# Patient Record
Sex: Female | Born: 1940 | Race: White | Hispanic: No | Marital: Married | Smoking: Former smoker
Health system: Southern US, Community
[De-identification: ages and names within clinical notes are randomized; demographics above are authoritative.]

## PROBLEM LIST (undated history)

## (undated) DIAGNOSIS — O99019 Anemia complicating pregnancy, unspecified trimester: Secondary | ICD-10-CM

## (undated) DIAGNOSIS — Z9049 Acquired absence of other specified parts of digestive tract: Secondary | ICD-10-CM

## (undated) DIAGNOSIS — M1990 Unspecified osteoarthritis, unspecified site: Secondary | ICD-10-CM

## (undated) DIAGNOSIS — I712 Thoracic aortic aneurysm, without rupture, unspecified: Secondary | ICD-10-CM

## (undated) DIAGNOSIS — S83209A Unspecified tear of unspecified meniscus, current injury, unspecified knee, initial encounter: Secondary | ICD-10-CM

## (undated) DIAGNOSIS — E785 Hyperlipidemia, unspecified: Secondary | ICD-10-CM

## (undated) DIAGNOSIS — F32A Depression, unspecified: Secondary | ICD-10-CM

## (undated) DIAGNOSIS — C801 Malignant (primary) neoplasm, unspecified: Secondary | ICD-10-CM

## (undated) DIAGNOSIS — M109 Gout, unspecified: Secondary | ICD-10-CM

## (undated) DIAGNOSIS — D0511 Intraductal carcinoma in situ of right breast: Secondary | ICD-10-CM

## (undated) DIAGNOSIS — C50919 Malignant neoplasm of unspecified site of unspecified female breast: Secondary | ICD-10-CM

## (undated) DIAGNOSIS — R05 Cough: Secondary | ICD-10-CM

## (undated) DIAGNOSIS — E119 Type 2 diabetes mellitus without complications: Secondary | ICD-10-CM

## (undated) DIAGNOSIS — K2270 Barrett's esophagus without dysplasia: Secondary | ICD-10-CM

## (undated) DIAGNOSIS — C439 Malignant melanoma of skin, unspecified: Secondary | ICD-10-CM

## (undated) DIAGNOSIS — K219 Gastro-esophageal reflux disease without esophagitis: Secondary | ICD-10-CM

## (undated) DIAGNOSIS — G4739 Other sleep apnea: Secondary | ICD-10-CM

## (undated) DIAGNOSIS — I10 Essential (primary) hypertension: Secondary | ICD-10-CM

## (undated) DIAGNOSIS — R053 Chronic cough: Secondary | ICD-10-CM

## (undated) DIAGNOSIS — T4145XA Adverse effect of unspecified anesthetic, initial encounter: Secondary | ICD-10-CM

## (undated) DIAGNOSIS — T8859XA Other complications of anesthesia, initial encounter: Secondary | ICD-10-CM

## (undated) DIAGNOSIS — F329 Major depressive disorder, single episode, unspecified: Secondary | ICD-10-CM

## (undated) DIAGNOSIS — H269 Unspecified cataract: Secondary | ICD-10-CM

## (undated) DIAGNOSIS — Z8489 Family history of other specified conditions: Secondary | ICD-10-CM

## (undated) DIAGNOSIS — I341 Nonrheumatic mitral (valve) prolapse: Secondary | ICD-10-CM

## (undated) DIAGNOSIS — N059 Unspecified nephritic syndrome with unspecified morphologic changes: Secondary | ICD-10-CM

## (undated) DIAGNOSIS — E7800 Pure hypercholesterolemia, unspecified: Secondary | ICD-10-CM

## (undated) DIAGNOSIS — I639 Cerebral infarction, unspecified: Secondary | ICD-10-CM

## (undated) HISTORY — PX: TONSILLECTOMY: SUR1361

## (undated) HISTORY — PX: CHOLECYSTECTOMY: SHX55

## (undated) HISTORY — PX: APPENDECTOMY: SHX54

## (undated) HISTORY — PX: PARTIAL HIP ARTHROPLASTY: SHX733

## (undated) HISTORY — PX: EYE SURGERY: SHX253

## (undated) HISTORY — DX: Intraductal carcinoma in situ of right breast: D05.11

## (undated) HISTORY — PX: ABDOMINAL HYSTERECTOMY: SHX81

## (undated) HISTORY — PX: FRACTURE SURGERY: SHX138

## (undated) HISTORY — DX: Unspecified cataract: H26.9

## (undated) HISTORY — PX: COLONOSCOPY: SHX174

## (undated) HISTORY — DX: Malignant neoplasm of unspecified site of unspecified female breast: C50.919

## (undated) HISTORY — PX: BREAST CYST ASPIRATION: SHX578

## (undated) HISTORY — PX: ESOPHAGOGASTRODUODENOSCOPY: SHX1529

## (undated) HISTORY — PX: BREAST EXCISIONAL BIOPSY: SUR124

## (undated) HISTORY — PX: JOINT REPLACEMENT: SHX530

---
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
## 2004-04-24 DIAGNOSIS — G510 Bell's palsy: Secondary | ICD-10-CM

## 2004-04-24 HISTORY — DX: Bell's palsy: G51.0

## 2004-07-05 ENCOUNTER — Ambulatory Visit: Payer: Self-pay | Admitting: Unknown Physician Specialty

## 2004-09-26 ENCOUNTER — Inpatient Hospital Stay: Payer: Self-pay | Admitting: Internal Medicine

## 2004-09-26 ENCOUNTER — Other Ambulatory Visit: Payer: Self-pay

## 2004-10-03 ENCOUNTER — Ambulatory Visit: Payer: Self-pay | Admitting: Family Medicine

## 2005-07-11 ENCOUNTER — Ambulatory Visit: Payer: Self-pay | Admitting: Unknown Physician Specialty

## 2007-07-22 ENCOUNTER — Ambulatory Visit: Payer: Self-pay | Admitting: Unknown Physician Specialty

## 2008-10-18 ENCOUNTER — Ambulatory Visit: Payer: Self-pay | Admitting: Unknown Physician Specialty

## 2008-10-20 ENCOUNTER — Ambulatory Visit: Payer: Self-pay | Admitting: Unknown Physician Specialty

## 2008-11-27 ENCOUNTER — Inpatient Hospital Stay: Payer: Self-pay | Admitting: Orthopedic Surgery

## 2009-08-02 ENCOUNTER — Ambulatory Visit: Payer: Self-pay | Admitting: Unknown Physician Specialty

## 2009-08-03 ENCOUNTER — Ambulatory Visit: Payer: Self-pay | Admitting: Unknown Physician Specialty

## 2010-03-23 ENCOUNTER — Ambulatory Visit: Payer: Self-pay | Admitting: Unknown Physician Specialty

## 2010-05-10 ENCOUNTER — Ambulatory Visit: Payer: Self-pay | Admitting: Unknown Physician Specialty

## 2010-05-12 LAB — PATHOLOGY REPORT

## 2011-06-01 ENCOUNTER — Ambulatory Visit: Payer: Self-pay | Admitting: Family Medicine

## 2011-06-01 DIAGNOSIS — E7800 Pure hypercholesterolemia, unspecified: Secondary | ICD-10-CM | POA: Diagnosis not present

## 2011-06-01 DIAGNOSIS — Z1231 Encounter for screening mammogram for malignant neoplasm of breast: Secondary | ICD-10-CM | POA: Diagnosis not present

## 2011-06-01 DIAGNOSIS — R209 Unspecified disturbances of skin sensation: Secondary | ICD-10-CM | POA: Diagnosis not present

## 2011-10-24 DIAGNOSIS — Z23 Encounter for immunization: Secondary | ICD-10-CM | POA: Diagnosis not present

## 2012-04-22 DIAGNOSIS — Z85820 Personal history of malignant melanoma of skin: Secondary | ICD-10-CM | POA: Diagnosis not present

## 2012-04-22 DIAGNOSIS — L570 Actinic keratosis: Secondary | ICD-10-CM | POA: Diagnosis not present

## 2013-03-23 DIAGNOSIS — D235 Other benign neoplasm of skin of trunk: Secondary | ICD-10-CM | POA: Diagnosis not present

## 2013-03-23 DIAGNOSIS — Z85820 Personal history of malignant melanoma of skin: Secondary | ICD-10-CM | POA: Diagnosis not present

## 2013-03-23 DIAGNOSIS — L821 Other seborrheic keratosis: Secondary | ICD-10-CM | POA: Diagnosis not present

## 2013-06-11 DIAGNOSIS — M722 Plantar fascial fibromatosis: Secondary | ICD-10-CM | POA: Diagnosis not present

## 2013-08-21 DIAGNOSIS — E7800 Pure hypercholesterolemia, unspecified: Secondary | ICD-10-CM | POA: Diagnosis not present

## 2013-08-21 DIAGNOSIS — Z0000 Encounter for general adult medical examination without abnormal findings: Secondary | ICD-10-CM | POA: Diagnosis not present

## 2013-08-21 DIAGNOSIS — Z23 Encounter for immunization: Secondary | ICD-10-CM | POA: Diagnosis not present

## 2013-08-21 DIAGNOSIS — E119 Type 2 diabetes mellitus without complications: Secondary | ICD-10-CM | POA: Diagnosis not present

## 2013-08-21 DIAGNOSIS — M25569 Pain in unspecified knee: Secondary | ICD-10-CM | POA: Diagnosis not present

## 2013-08-28 DIAGNOSIS — E7800 Pure hypercholesterolemia, unspecified: Secondary | ICD-10-CM | POA: Diagnosis not present

## 2013-08-28 DIAGNOSIS — E119 Type 2 diabetes mellitus without complications: Secondary | ICD-10-CM | POA: Diagnosis not present

## 2013-08-28 DIAGNOSIS — I10 Essential (primary) hypertension: Secondary | ICD-10-CM | POA: Diagnosis not present

## 2013-08-28 DIAGNOSIS — R252 Cramp and spasm: Secondary | ICD-10-CM | POA: Diagnosis not present

## 2013-08-31 DIAGNOSIS — E119 Type 2 diabetes mellitus without complications: Secondary | ICD-10-CM | POA: Diagnosis not present

## 2013-09-09 DIAGNOSIS — E7800 Pure hypercholesterolemia, unspecified: Secondary | ICD-10-CM | POA: Diagnosis not present

## 2013-09-09 DIAGNOSIS — E119 Type 2 diabetes mellitus without complications: Secondary | ICD-10-CM | POA: Diagnosis not present

## 2013-09-09 DIAGNOSIS — F3289 Other specified depressive episodes: Secondary | ICD-10-CM | POA: Diagnosis not present

## 2013-10-08 ENCOUNTER — Ambulatory Visit: Payer: Self-pay | Admitting: Family Medicine

## 2013-10-08 DIAGNOSIS — Z1231 Encounter for screening mammogram for malignant neoplasm of breast: Secondary | ICD-10-CM | POA: Diagnosis not present

## 2013-10-08 DIAGNOSIS — M949 Disorder of cartilage, unspecified: Secondary | ICD-10-CM | POA: Diagnosis not present

## 2013-10-08 DIAGNOSIS — M899 Disorder of bone, unspecified: Secondary | ICD-10-CM | POA: Diagnosis not present

## 2013-10-20 DIAGNOSIS — M25562 Pain in left knee: Secondary | ICD-10-CM | POA: Diagnosis not present

## 2013-10-20 DIAGNOSIS — Z96652 Presence of left artificial knee joint: Secondary | ICD-10-CM | POA: Diagnosis not present

## 2013-10-20 DIAGNOSIS — M2392 Unspecified internal derangement of left knee: Secondary | ICD-10-CM | POA: Diagnosis not present

## 2013-11-18 DIAGNOSIS — E038 Other specified hypothyroidism: Secondary | ICD-10-CM | POA: Diagnosis not present

## 2013-11-18 DIAGNOSIS — E780 Pure hypercholesterolemia: Secondary | ICD-10-CM | POA: Diagnosis not present

## 2013-11-18 DIAGNOSIS — I10 Essential (primary) hypertension: Secondary | ICD-10-CM | POA: Diagnosis not present

## 2013-11-18 DIAGNOSIS — Z23 Encounter for immunization: Secondary | ICD-10-CM | POA: Diagnosis not present

## 2013-11-18 DIAGNOSIS — E119 Type 2 diabetes mellitus without complications: Secondary | ICD-10-CM | POA: Diagnosis not present

## 2013-11-23 DIAGNOSIS — Z85820 Personal history of malignant melanoma of skin: Secondary | ICD-10-CM | POA: Diagnosis not present

## 2013-11-23 DIAGNOSIS — D2271 Melanocytic nevi of right lower limb, including hip: Secondary | ICD-10-CM | POA: Diagnosis not present

## 2013-11-23 DIAGNOSIS — L538 Other specified erythematous conditions: Secondary | ICD-10-CM | POA: Diagnosis not present

## 2013-11-23 DIAGNOSIS — L298 Other pruritus: Secondary | ICD-10-CM | POA: Diagnosis not present

## 2013-11-23 DIAGNOSIS — D2262 Melanocytic nevi of left upper limb, including shoulder: Secondary | ICD-10-CM | POA: Diagnosis not present

## 2013-11-23 DIAGNOSIS — D2261 Melanocytic nevi of right upper limb, including shoulder: Secondary | ICD-10-CM | POA: Diagnosis not present

## 2013-11-23 DIAGNOSIS — L820 Inflamed seborrheic keratosis: Secondary | ICD-10-CM | POA: Diagnosis not present

## 2014-07-16 ENCOUNTER — Other Ambulatory Visit: Payer: Self-pay | Admitting: Family Medicine

## 2014-07-16 DIAGNOSIS — N3281 Overactive bladder: Secondary | ICD-10-CM | POA: Insufficient documentation

## 2014-07-16 DIAGNOSIS — R350 Frequency of micturition: Secondary | ICD-10-CM

## 2014-07-28 ENCOUNTER — Other Ambulatory Visit: Payer: Self-pay | Admitting: Family Medicine

## 2014-07-28 DIAGNOSIS — F329 Major depressive disorder, single episode, unspecified: Secondary | ICD-10-CM

## 2014-07-28 DIAGNOSIS — F32A Depression, unspecified: Secondary | ICD-10-CM

## 2014-08-06 ENCOUNTER — Other Ambulatory Visit: Payer: Self-pay | Admitting: Family Medicine

## 2014-08-06 DIAGNOSIS — K219 Gastro-esophageal reflux disease without esophagitis: Secondary | ICD-10-CM | POA: Insufficient documentation

## 2014-09-26 ENCOUNTER — Other Ambulatory Visit: Payer: Self-pay | Admitting: Family Medicine

## 2014-09-26 DIAGNOSIS — I10 Essential (primary) hypertension: Secondary | ICD-10-CM

## 2014-09-27 DIAGNOSIS — I152 Hypertension secondary to endocrine disorders: Secondary | ICD-10-CM | POA: Insufficient documentation

## 2014-09-27 DIAGNOSIS — I10 Essential (primary) hypertension: Secondary | ICD-10-CM | POA: Insufficient documentation

## 2014-09-27 NOTE — Telephone Encounter (Signed)
Last OV 11/18/2013  Thanks,   -Mickel Baas

## 2014-10-17 ENCOUNTER — Other Ambulatory Visit: Payer: Self-pay | Admitting: Family Medicine

## 2014-10-17 DIAGNOSIS — R350 Frequency of micturition: Secondary | ICD-10-CM

## 2014-10-18 NOTE — Telephone Encounter (Signed)
Last OV 11/2013  Thanks,   -Laura  

## 2014-10-21 DIAGNOSIS — H2513 Age-related nuclear cataract, bilateral: Secondary | ICD-10-CM | POA: Diagnosis not present

## 2014-10-21 LAB — HM DIABETES EYE EXAM

## 2014-10-28 ENCOUNTER — Other Ambulatory Visit: Payer: Self-pay | Admitting: Family Medicine

## 2014-10-28 DIAGNOSIS — E7800 Pure hypercholesterolemia, unspecified: Secondary | ICD-10-CM

## 2014-10-28 DIAGNOSIS — E785 Hyperlipidemia, unspecified: Secondary | ICD-10-CM | POA: Insufficient documentation

## 2014-11-21 ENCOUNTER — Other Ambulatory Visit: Payer: Self-pay | Admitting: Family Medicine

## 2014-11-21 DIAGNOSIS — F4322 Adjustment disorder with anxiety: Secondary | ICD-10-CM

## 2014-11-22 DIAGNOSIS — D2272 Melanocytic nevi of left lower limb, including hip: Secondary | ICD-10-CM | POA: Diagnosis not present

## 2014-11-22 DIAGNOSIS — D2261 Melanocytic nevi of right upper limb, including shoulder: Secondary | ICD-10-CM | POA: Diagnosis not present

## 2014-11-22 DIAGNOSIS — D2239 Melanocytic nevi of other parts of face: Secondary | ICD-10-CM | POA: Diagnosis not present

## 2014-11-22 DIAGNOSIS — F4320 Adjustment disorder, unspecified: Secondary | ICD-10-CM | POA: Insufficient documentation

## 2014-11-22 DIAGNOSIS — Z85820 Personal history of malignant melanoma of skin: Secondary | ICD-10-CM | POA: Diagnosis not present

## 2014-12-08 DIAGNOSIS — H02833 Dermatochalasis of right eye, unspecified eyelid: Secondary | ICD-10-CM | POA: Diagnosis not present

## 2014-12-16 DIAGNOSIS — H02831 Dermatochalasis of right upper eyelid: Secondary | ICD-10-CM | POA: Diagnosis not present

## 2014-12-16 DIAGNOSIS — H02832 Dermatochalasis of right lower eyelid: Secondary | ICD-10-CM | POA: Diagnosis not present

## 2014-12-25 ENCOUNTER — Other Ambulatory Visit: Payer: Self-pay | Admitting: Family Medicine

## 2014-12-25 DIAGNOSIS — I10 Essential (primary) hypertension: Secondary | ICD-10-CM

## 2014-12-27 NOTE — Telephone Encounter (Signed)
Last OV 11/2013  Thanks,   -Sherhonda Gaspar  

## 2015-01-22 ENCOUNTER — Other Ambulatory Visit: Payer: Self-pay | Admitting: Family Medicine

## 2015-01-24 ENCOUNTER — Encounter: Payer: Self-pay | Admitting: *Deleted

## 2015-01-26 ENCOUNTER — Other Ambulatory Visit: Payer: Self-pay | Admitting: Family Medicine

## 2015-01-26 DIAGNOSIS — F329 Major depressive disorder, single episode, unspecified: Secondary | ICD-10-CM | POA: Insufficient documentation

## 2015-01-26 DIAGNOSIS — F32A Depression, unspecified: Secondary | ICD-10-CM | POA: Insufficient documentation

## 2015-01-26 NOTE — Telephone Encounter (Signed)
Last OV 11/2013  Thanks,   -Thaddeus Evitts  

## 2015-01-31 NOTE — Discharge Instructions (Signed)
INSTRUCTIONS FOLLOWING OCULOPLASTIC SURGERY °AMY M. FOWLER, MD ° °AFTER YOUR EYE SURGERY, THER ARE MANY THINGS THWIHC YOU, THE PATIENT, CAN DO TO ASSURE THE BEST POSSIBLE RESULT FROM YOUR OPERATION.  THIS SHEET SHOULD BE REFERRED TO WHENEVER QUESTIONS ARISE.  IF THERE ARE ANY QUESTIONS NOT ANSWERED HERE, DO NOT HESITATE TO CALL OUR OFFICE AT [phone] OR [phone].  THERE IS ALWAYS [facility] AVAILABLE TO CALL IF QUESTIONS OR PROBLEMS ARISE. ° °VISION: Your vision may be blurred and out of focus after surgery until you are able to stop using your ointment, swelling resolves and your eye(s) heal. This may take 1 to 2 weeks at the least.  If your vision becomes gradually more dim or dark, this is not normal and you need to call our office immediately. ° °EYE CARE: For the first 48 hours after surgery, use ice packs frequently - “20 minutes on, 20 minutes off” - to help reduce swelling and bruising.  Small bags of frozen peas or corn make good ice packs along with cloths soaked in ice water.  If you are wearing a patch or other type of dressing following surgery, keep this on for the amount of time specified by your doctor.  For the first week following surgery, you will need to treat your stitches with great care.  If is OK to shower, but take care to not allow soapy water to run into your eye(s) to help reduce changes of infection.  You may gently clean the eyelashes and around the eye(s) with cotton balls and sterile water, BUT DO NOT RUB THE STITCHES VIGOROUSLY.  Keeping your stitches moist with ointment will help promote healing with minimal scar formation. ° °ACTIVITY: When you leave the surgery center, you should go home, rest and be inactive.  The eye(s) may feel scratchy and keeping the eyes closed will allow for faster healing.  The first week following surgery, avoid straining (anything making the face turn red) or lifting over 20 pounds.  Additionally, avoid bending which causes your head to go below  your waist.  Using your eyes will NOT harm them, so feel free to read, watch television, use the computer, etc as desired.  Driving depends on each individual, so check with your doctor if you have questions about driving. ° °MEDICATIONS:  You will be given a prescription for an ointment to use 4 times a day on your stitches.  You can use the ointment in your eyes if they feel scratchy or irritated.  If you eyelid(s) don’t close completely when you sleep, put some ointment in your eyes before bedtime. ° °EMERGENCY: If you experience SEVERE EYE PAIN OR HEADACHE UNRELIEVED BY TYLENOL OR PERCOCET, NAUSEA OR VOMITING, WORSENING REDNESS, OR WORSENING VISION (ESPECIALLY VISION THAT WA INITIALLY BETTER) CALL [phone] OR [phone] DURING BUSINESS HOURS OR AFTER HOURS. ° °General Anesthesia, Adult, Care After °Refer to this sheet in the next few weeks. These instructions provide you with information on caring for yourself after your procedure. Your health care provider may also give you more specific instructions. Your treatment has been planned according to current medical practices, but problems sometimes occur. Call your health care provider if you have any problems or questions after your procedure. °WHAT TO EXPECT AFTER THE PROCEDURE °After the procedure, it is typical to experience: °· Sleepiness. °· Nausea and vomiting. °HOME CARE INSTRUCTIONS °· For the first 24 hours after general anesthesia: °¨ Have a responsible person with you. °¨ Do not drive a car. If you   are alone, do not take public transportation. °¨ Do not drink alcohol. °¨ Do not take medicine that has not been prescribed by your health care provider. °¨ Do not sign important papers or make important decisions. °¨ You may resume a normal diet and activities as directed by your health care provider. °· Change bandages (dressings) as directed. °· If you have questions or problems that seem related to general anesthesia, call the hospital and ask for  the anesthetist or anesthesiologist on call. °SEEK MEDICAL CARE IF: °· You have nausea and vomiting that continue the day after anesthesia. °· You develop a rash. °SEEK IMMEDIATE MEDICAL CARE IF:  °· You have difficulty breathing. °· You have chest pain. °· You have any allergic problems. °  °This information is not intended to replace advice given to you by your health care provider. Make sure you discuss any questions you have with your health care provider. °  °Document Released: 04/09/2000 Document Revised: 01/22/2014 Document Reviewed: 05/02/2011 °Elsevier Interactive Patient Education ©2016 [employer] ° °

## 2015-02-01 ENCOUNTER — Ambulatory Visit: Payer: Medicare Other | Admitting: Anesthesiology

## 2015-02-01 ENCOUNTER — Ambulatory Visit
Admission: RE | Admit: 2015-02-01 | Discharge: 2015-02-01 | Disposition: A | Discharge status: Home / Self Care | Payer: Medicare Other | Source: Ambulatory Visit | Attending: Ophthalmology | Admitting: Ophthalmology

## 2015-02-01 ENCOUNTER — Encounter
Admission: RE | Disposition: A | Discharge status: Home / Self Care | Payer: Self-pay | Source: Ambulatory Visit | Attending: Ophthalmology

## 2015-02-01 DIAGNOSIS — F329 Major depressive disorder, single episode, unspecified: Secondary | ICD-10-CM | POA: Insufficient documentation

## 2015-02-01 DIAGNOSIS — H02831 Dermatochalasis of right upper eyelid: Secondary | ICD-10-CM | POA: Diagnosis not present

## 2015-02-01 DIAGNOSIS — Z87891 Personal history of nicotine dependence: Secondary | ICD-10-CM | POA: Insufficient documentation

## 2015-02-01 DIAGNOSIS — H02834 Dermatochalasis of left upper eyelid: Secondary | ICD-10-CM | POA: Diagnosis not present

## 2015-02-01 DIAGNOSIS — Z90710 Acquired absence of both cervix and uterus: Secondary | ICD-10-CM | POA: Diagnosis not present

## 2015-02-01 DIAGNOSIS — Z96649 Presence of unspecified artificial hip joint: Secondary | ICD-10-CM | POA: Diagnosis not present

## 2015-02-01 DIAGNOSIS — K589 Irritable bowel syndrome without diarrhea: Secondary | ICD-10-CM | POA: Diagnosis not present

## 2015-02-01 DIAGNOSIS — E7800 Pure hypercholesterolemia, unspecified: Secondary | ICD-10-CM | POA: Insufficient documentation

## 2015-02-01 DIAGNOSIS — I341 Nonrheumatic mitral (valve) prolapse: Secondary | ICD-10-CM | POA: Insufficient documentation

## 2015-02-01 DIAGNOSIS — Z880 Allergy status to penicillin: Secondary | ICD-10-CM | POA: Diagnosis not present

## 2015-02-01 DIAGNOSIS — E119 Type 2 diabetes mellitus without complications: Secondary | ICD-10-CM | POA: Insufficient documentation

## 2015-02-01 DIAGNOSIS — Z9049 Acquired absence of other specified parts of digestive tract: Secondary | ICD-10-CM | POA: Diagnosis not present

## 2015-02-01 DIAGNOSIS — D649 Anemia, unspecified: Secondary | ICD-10-CM | POA: Insufficient documentation

## 2015-02-01 DIAGNOSIS — I10 Essential (primary) hypertension: Secondary | ICD-10-CM | POA: Insufficient documentation

## 2015-02-01 DIAGNOSIS — Z886 Allergy status to analgesic agent status: Secondary | ICD-10-CM | POA: Insufficient documentation

## 2015-02-01 DIAGNOSIS — Z86010 Personal history of colonic polyps: Secondary | ICD-10-CM | POA: Insufficient documentation

## 2015-02-01 DIAGNOSIS — M1990 Unspecified osteoarthritis, unspecified site: Secondary | ICD-10-CM | POA: Diagnosis not present

## 2015-02-01 DIAGNOSIS — Z885 Allergy status to narcotic agent status: Secondary | ICD-10-CM | POA: Insufficient documentation

## 2015-02-01 HISTORY — DX: Unspecified osteoarthritis, unspecified site: M19.90

## 2015-02-01 HISTORY — PX: BROW LIFT: SHX178

## 2015-02-01 HISTORY — DX: Type 2 diabetes mellitus without complications: E11.9

## 2015-02-01 HISTORY — DX: Unspecified tear of unspecified meniscus, current injury, unspecified knee, initial encounter: S83.209A

## 2015-02-01 HISTORY — DX: Essential (primary) hypertension: I10

## 2015-02-01 HISTORY — DX: Depression, unspecified: F32.A

## 2015-02-01 HISTORY — DX: Nonrheumatic mitral (valve) prolapse: I34.1

## 2015-02-01 HISTORY — DX: Pure hypercholesterolemia, unspecified: E78.00

## 2015-02-01 HISTORY — DX: Major depressive disorder, single episode, unspecified: F32.9

## 2015-02-01 SURGERY — BLEPHAROPLASTY
Anesthesia: Monitor Anesthesia Care | Site: Eye | Laterality: Bilateral | Wound class: Clean

## 2015-02-01 MED ORDER — ERYTHROMYCIN 5 MG/GM OP OINT
TOPICAL_OINTMENT | OPHTHALMIC | Status: DC | PRN
  Administered 2015-02-01: 1 via OPHTHALMIC

## 2015-02-01 MED ORDER — MIDAZOLAM HCL 2 MG/2ML IJ SOLN
INTRAMUSCULAR | Status: DC | PRN
  Administered 2015-02-01 (×2): 1 mg via INTRAVENOUS

## 2015-02-01 MED ORDER — OXYCODONE-ACETAMINOPHEN 5-325 MG PO TABS: 1.0000 | ORAL_TABLET | ORAL | Status: DC | PRN

## 2015-02-01 MED ORDER — LIDOCAINE-EPINEPHRINE 2 %-1:100000 IJ SOLN
INTRAMUSCULAR | Status: DC | PRN
  Administered 2015-02-01: 3 mL via OPHTHALMIC
  Administered 2015-02-01: .5 mL via OPHTHALMIC

## 2015-02-01 MED ORDER — BACITRACIN 500 UNIT/GM OP OINT: TOPICAL_OINTMENT | OPHTHALMIC | Status: DC

## 2015-02-01 MED ORDER — LACTATED RINGERS IV SOLN
INTRAVENOUS | Status: DC
  Administered 2015-02-01: 08:00:00 via INTRAVENOUS

## 2015-02-01 MED ORDER — ALFENTANIL 500 MCG/ML IJ INJ
INJECTION | INTRAMUSCULAR | Status: DC | PRN
  Administered 2015-02-01 (×2): 300 ug via INTRAVENOUS

## 2015-02-01 SURGICAL SUPPLY — 35 items
APPLICATOR COTTON TIP WD 3 STR (MISCELLANEOUS) ×3 IMPLANT
BLADE SURG 15 STRL LF DISP TIS (BLADE) ×1 IMPLANT
BLADE SURG 15 STRL SS (BLADE) ×2
CORD BIP STRL DISP 12FT (MISCELLANEOUS) ×3 IMPLANT
DRAPE HEAD BAR (DRAPES) ×3 IMPLANT
GAUZE SPONGE 4X4 12PLY STRL (GAUZE/BANDAGES/DRESSINGS) ×3 IMPLANT
GAUZE SPONGE NON-WVN 2X2 STRL (MISCELLANEOUS) ×10 IMPLANT
GLOVE SURG LX 7.0 MICRO (GLOVE) ×4
GLOVE SURG LX STRL 7.0 MICRO (GLOVE) ×2 IMPLANT
MARKER SKIN XFINE TIP W/RULER (MISCELLANEOUS) ×3 IMPLANT
NEEDLE FILTER BLUNT 18X 1/2SAF (NEEDLE) ×2
NEEDLE FILTER BLUNT 18X1 1/2 (NEEDLE) ×1 IMPLANT
NEEDLE HYPO 30X.5 LL (NEEDLE) ×6 IMPLANT
PACK DRAPE NASAL/ENT (PACKS) ×3 IMPLANT
SOL PREP PVP 2OZ (MISCELLANEOUS) ×3
SOLUTION PREP PVP 2OZ (MISCELLANEOUS) ×1 IMPLANT
SPONGE VERSALON 2X2 STRL (MISCELLANEOUS) ×20
SUT CHROMIC 4-0 (SUTURE)
SUT CHROMIC 4-0 M2 12X2 ARM (SUTURE)
SUT CHROMIC 5 0 P 3 (SUTURE) IMPLANT
SUT ETHILON 4 0 CL P 3 (SUTURE) IMPLANT
SUT MERSILENE 4-0 S-2 (SUTURE) IMPLANT
SUT PLAIN GUT (SUTURE) ×3 IMPLANT
SUT PROLENE 5 0 P 3 (SUTURE) IMPLANT
SUT PROLENE 6 0 P 1 18 (SUTURE) IMPLANT
SUT SILK 4 0 G 3 (SUTURE) IMPLANT
SUT VIC AB 5-0 P-3 18X BRD (SUTURE) IMPLANT
SUT VIC AB 5-0 P3 18 (SUTURE)
SUT VICRYL 6-0  S14 CTD (SUTURE)
SUT VICRYL 6-0 S14 CTD (SUTURE) IMPLANT
SUT VICRYL 7 0 TG140 8 (SUTURE) IMPLANT
SUTURE CHRMC 4-0 M2 12X2 ARM (SUTURE) IMPLANT
SYR 3ML LL SCALE MARK (SYRINGE) ×3 IMPLANT
SYRINGE 10CC LL (SYRINGE) ×3 IMPLANT
WATER STERILE IRR 500ML POUR (IV SOLUTION) ×3 IMPLANT

## 2015-02-01 NOTE — Anesthesia Procedure Notes (Signed)
Procedure Name: MAC Date/Time: 02/01/2015 8:38 AM Performed by: Cameron Ali Pre-anesthesia Checklist: Patient identified, Emergency Drugs available, Suction available, Timeout performed and Patient being monitored Patient Re-evaluated:Patient Re-evaluated prior to inductionOxygen Delivery Method: Nasal cannula Placement Confirmation: positive ETCO2

## 2015-02-01 NOTE — Anesthesia Preprocedure Evaluation (Signed)
Anesthesia Evaluation  Patient identified by MRN, date of birth, ID band  Reviewed: Allergy & Precautions, NPO status , Patient's Chart, lab work & pertinent test results, reviewed documented beta blocker date and time   Airway Mallampati: II  TM Distance: >3 FB Neck ROM: Full    Dental no notable dental hx.    Pulmonary former smoker,    Pulmonary exam normal        Cardiovascular hypertension, Normal cardiovascular exam     Neuro/Psych Depression    GI/Hepatic Neg liver ROS, GERD  Medicated and Controlled,  Endo/Other  diabetes, Well Controlled, Type 2  Renal/GU negative Renal ROS     Musculoskeletal  (+) Arthritis ,   Abdominal   Peds  Hematology negative hematology ROS (+)   Anesthesia Other Findings   Reproductive/Obstetrics                             Anesthesia Physical Anesthesia Plan  ASA: II  Anesthesia Plan: MAC   Post-op Pain Management:    Induction: Intravenous  Airway Management Planned: Mask  Additional Equipment:   Intra-op Plan:   Post-operative Plan:   Informed Consent: I have reviewed the patients History and Physical, chart, labs and discussed the procedure including the risks, benefits and alternatives for the proposed anesthesia with the patient or authorized representative who has indicated his/her understanding and acceptance.     Plan Discussed with: CRNA  Anesthesia Plan Comments:         Anesthesia Quick Evaluation

## 2015-02-01 NOTE — Op Note (Signed)
Preoperative Diagnosis:  Visually significant dermatochalasis both Upper Eyelid(s)  Postoperative Diagnosis:  Same.  Procedure(s) Performed:   Upper eyelid blepharoplasty with excess skin excision  both Upper Eyelid(s)  Teaching Surgeon: Philis Pique. Vickki Muff, M.D.  Assistants: none  Anesthesia: MAC  Specimens: None.  Estimated Blood Loss: Minimal.  Complications: None.  Operative Findings: None Dictated  Procedure:   Allergies were reviewed and the patient is allergic to Codeine; Naproxen; Opium; and Penicillins.   After the risks, benefits, complications and alternatives were discussed with the patient, appropriate informed consent was obtained and the patient was brought to the operating suite. The patient was reclined supine and a timeout was conducted.  The patient was then sedated.  Local anesthetic consisting of a 50-50 mixture of 2% lidocaine with epinephrine and 0.75% bupivacaine with added Hylenex was injected subcutaneously to both upper eyelid(s). After adequate local was instilled, the patient was prepped and draped in the usual sterile fashion for eyelid surgery.   Attention was turned to the upper eyelids. A 33mm upper eyelid crease incision line was marked with calipers on both upper eyelid(s).  A pinch test was used to estimate the amount of excess skin to remove and this was marked in standard blepharoplasty style fashion. Attention was turned to the  right upper eyelid. A #15 blade was used to open the premarked incision line. A skin and muscle flap was excised and hemostasis was obtained with bipolar cautery.   A buttonhole was created orbicularis orbital septum medially. The medial fat pocket was identified and dissected free from fascial attachments, cauterized at the pedicle base, and excised.  Attention was then turned to the opposite eyelid where the same procedure was performed in the same manner. Hemostasis was obtained with bipolar cautery throughout. All  incisions were then closed with a combination of running and interrupted 6-0 fast absorbing plain suture. The patient tolerated the procedure well.  Erythromycin Ophthalmic ointment was applied to her incision sites, followed by ice packs. She was taken to the recovery area where she recovered without difficulty.  Post-Op Plan/Instructions:  The patient was instructed to use ice packs frequently for the next 48 hours. She was instructed to use bacitracin ophthalmic ointment on her incisions 4 times a day for the next 12 to 14 days. She was given a prescription for Percocet for pain control should Tylenol not be effective. She was asked to to follow up in 2 weeks' time at the [facility] in [place], [place] or sooner as needed for problems.  Teaching Surgeon Attestation: None  Casie Sturgeon M. Vickki Muff, M.D. Attending,Ophthalmology

## 2015-02-01 NOTE — Transfer of Care (Signed)
Immediate Anesthesia Transfer of Care Note  Patient: Natasha Vasquez  Procedure(s) Performed: Procedure(s) with comments: BLEPHAROPLASTY bilateral upper eyelids. (Bilateral) - DIABETIC - diet controlled  Patient Location: PACU  Anesthesia Type: MAC  Level of Consciousness: awake, alert  and patient cooperative  Airway and Oxygen Therapy: Patient Spontanous Breathing and Patient connected to supplemental oxygen  Post-op Assessment: Post-op Vital signs reviewed, Patient's Cardiovascular Status Stable, Respiratory Function Stable, Patent Airway and No signs of Nausea or vomiting  Post-op Vital Signs: Reviewed and stable  Complications: No apparent anesthesia complications

## 2015-02-01 NOTE — Anesthesia Postprocedure Evaluation (Signed)
Anesthesia Post Note  Patient: Natasha Vasquez  Procedure(s) Performed: Procedure(s) (LRB): BLEPHAROPLASTY bilateral upper eyelids. (Bilateral)  Patient location during evaluation: PACU Anesthesia Type: MAC Level of consciousness: awake and alert Pain management: pain level controlled Vital Signs Assessment: post-procedure vital signs reviewed and stable Respiratory status: nonlabored ventilation and spontaneous breathing Cardiovascular status: stable Postop Assessment: no signs of nausea or vomiting and adequate PO intake Anesthetic complications: no    Estill Batten

## 2015-02-01 NOTE — H&P (Signed)
  See the H&P completed at [facility] that is scanned onto the chart

## 2015-02-02 ENCOUNTER — Encounter: Payer: Self-pay | Admitting: Ophthalmology

## 2015-03-01 ENCOUNTER — Other Ambulatory Visit: Payer: Self-pay | Admitting: Family Medicine

## 2015-03-01 DIAGNOSIS — F329 Major depressive disorder, single episode, unspecified: Secondary | ICD-10-CM

## 2015-03-01 DIAGNOSIS — F32A Depression, unspecified: Secondary | ICD-10-CM

## 2015-03-02 NOTE — Telephone Encounter (Signed)
11/18/13 last ov

## 2015-03-09 ENCOUNTER — Other Ambulatory Visit: Payer: Self-pay

## 2015-03-09 DIAGNOSIS — F4322 Adjustment disorder with anxiety: Secondary | ICD-10-CM

## 2015-03-22 DIAGNOSIS — M25562 Pain in left knee: Secondary | ICD-10-CM | POA: Diagnosis not present

## 2015-03-22 DIAGNOSIS — M25551 Pain in right hip: Secondary | ICD-10-CM | POA: Diagnosis not present

## 2015-03-22 DIAGNOSIS — G8929 Other chronic pain: Secondary | ICD-10-CM | POA: Diagnosis not present

## 2015-03-22 DIAGNOSIS — M2392 Unspecified internal derangement of left knee: Secondary | ICD-10-CM | POA: Diagnosis not present

## 2015-03-22 DIAGNOSIS — M7061 Trochanteric bursitis, right hip: Secondary | ICD-10-CM | POA: Diagnosis not present

## 2015-03-22 DIAGNOSIS — S72001D Fracture of unspecified part of neck of right femur, subsequent encounter for closed fracture with routine healing: Secondary | ICD-10-CM | POA: Diagnosis not present

## 2015-03-23 ENCOUNTER — Other Ambulatory Visit: Payer: Self-pay | Admitting: Orthopedic Surgery

## 2015-03-23 DIAGNOSIS — G8929 Other chronic pain: Secondary | ICD-10-CM

## 2015-03-23 DIAGNOSIS — M25562 Pain in left knee: Principal | ICD-10-CM

## 2015-03-25 DIAGNOSIS — S72001A Fracture of unspecified part of neck of right femur, initial encounter for closed fracture: Secondary | ICD-10-CM | POA: Insufficient documentation

## 2015-04-05 DIAGNOSIS — R1013 Epigastric pain: Secondary | ICD-10-CM | POA: Diagnosis not present

## 2015-04-05 DIAGNOSIS — R197 Diarrhea, unspecified: Secondary | ICD-10-CM | POA: Diagnosis not present

## 2015-04-05 DIAGNOSIS — Z800 Family history of malignant neoplasm of digestive organs: Secondary | ICD-10-CM | POA: Diagnosis not present

## 2015-04-05 DIAGNOSIS — K219 Gastro-esophageal reflux disease without esophagitis: Secondary | ICD-10-CM | POA: Diagnosis not present

## 2015-04-07 ENCOUNTER — Other Ambulatory Visit
Admission: RE | Admit: 2015-04-07 | Discharge: 2015-04-07 | Disposition: A | Discharge status: Home / Self Care | Payer: Medicare Other | Source: Ambulatory Visit | Attending: Physician Assistant | Admitting: Physician Assistant

## 2015-04-07 DIAGNOSIS — R197 Diarrhea, unspecified: Secondary | ICD-10-CM | POA: Insufficient documentation

## 2015-04-07 LAB — GASTROINTESTINAL PANEL BY PCR, STOOL (REPLACES STOOL CULTURE)
ADENOVIRUS F40/41: NOT DETECTED
Astrovirus: NOT DETECTED
CAMPYLOBACTER SPECIES: NOT DETECTED
CRYPTOSPORIDIUM: NOT DETECTED
Cyclospora cayetanensis: NOT DETECTED
E. coli O157: NOT DETECTED
ENTEROAGGREGATIVE E COLI (EAEC): NOT DETECTED
ENTEROPATHOGENIC E COLI (EPEC): NOT DETECTED
Entamoeba histolytica: NOT DETECTED
Enterotoxigenic E coli (ETEC): NOT DETECTED
GIARDIA LAMBLIA: NOT DETECTED
NOROVIRUS GI/GII: NOT DETECTED
PLESIMONAS SHIGELLOIDES: NOT DETECTED
Rotavirus A: NOT DETECTED
Salmonella species: NOT DETECTED
Sapovirus (I, II, IV, and V): NOT DETECTED
Shiga like toxin producing E coli (STEC): NOT DETECTED
Shigella/Enteroinvasive E coli (EIEC): NOT DETECTED
VIBRIO CHOLERAE: NOT DETECTED
Vibrio species: NOT DETECTED
YERSINIA ENTEROCOLITICA: NOT DETECTED

## 2015-04-07 LAB — C DIFFICILE QUICK SCREEN W PCR REFLEX
C DIFFICILE (CDIFF) TOXIN: NEGATIVE
C Diff antigen: NEGATIVE
C Diff interpretation: NEGATIVE

## 2015-04-08 ENCOUNTER — Ambulatory Visit
Admission: RE | Admit: 2015-04-08 | Discharge: 2015-04-08 | Disposition: A | Discharge status: Home / Self Care | Payer: Medicare Other | Source: Ambulatory Visit | Attending: Orthopedic Surgery | Admitting: Orthopedic Surgery

## 2015-04-08 DIAGNOSIS — G8929 Other chronic pain: Secondary | ICD-10-CM | POA: Insufficient documentation

## 2015-04-08 DIAGNOSIS — S83242A Other tear of medial meniscus, current injury, left knee, initial encounter: Secondary | ICD-10-CM | POA: Diagnosis not present

## 2015-04-08 DIAGNOSIS — X58XXXA Exposure to other specified factors, initial encounter: Secondary | ICD-10-CM | POA: Insufficient documentation

## 2015-04-08 DIAGNOSIS — M25562 Pain in left knee: Secondary | ICD-10-CM

## 2015-04-12 DIAGNOSIS — M2392 Unspecified internal derangement of left knee: Secondary | ICD-10-CM | POA: Diagnosis not present

## 2015-04-12 LAB — PANCREATIC ELASTASE, FECAL: Pancreatic Elastase-1, Stool: >500 ug Elast./g (ref >200)

## 2015-04-12 LAB — LACTOFERRIN, FECAL, QUANT.: Lactoferrin, Fecal, Quant.: <1 ug/mL(g) (ref 0.00–7.24)

## 2015-05-10 ENCOUNTER — Encounter: Payer: Self-pay | Admitting: *Deleted

## 2015-05-11 ENCOUNTER — Ambulatory Visit: Payer: Medicare Other | Admitting: Anesthesiology

## 2015-05-11 ENCOUNTER — Encounter
Admission: RE | Disposition: A | Discharge status: Home / Self Care | Payer: Self-pay | Source: Ambulatory Visit | Attending: Unknown Physician Specialty

## 2015-05-11 ENCOUNTER — Ambulatory Visit
Admission: RE | Admit: 2015-05-11 | Discharge: 2015-05-11 | Disposition: A | Discharge status: Home / Self Care | Payer: Medicare Other | Source: Ambulatory Visit | Attending: Unknown Physician Specialty | Admitting: Unknown Physician Specialty

## 2015-05-11 DIAGNOSIS — K5790 Diverticulosis of intestine, part unspecified, without perforation or abscess without bleeding: Secondary | ICD-10-CM | POA: Diagnosis not present

## 2015-05-11 DIAGNOSIS — D125 Benign neoplasm of sigmoid colon: Secondary | ICD-10-CM | POA: Diagnosis not present

## 2015-05-11 DIAGNOSIS — I341 Nonrheumatic mitral (valve) prolapse: Secondary | ICD-10-CM | POA: Insufficient documentation

## 2015-05-11 DIAGNOSIS — I10 Essential (primary) hypertension: Secondary | ICD-10-CM | POA: Insufficient documentation

## 2015-05-11 DIAGNOSIS — M19042 Primary osteoarthritis, left hand: Secondary | ICD-10-CM | POA: Diagnosis not present

## 2015-05-11 DIAGNOSIS — K5730 Diverticulosis of large intestine without perforation or abscess without bleeding: Secondary | ICD-10-CM | POA: Diagnosis not present

## 2015-05-11 DIAGNOSIS — R197 Diarrhea, unspecified: Secondary | ICD-10-CM | POA: Diagnosis not present

## 2015-05-11 DIAGNOSIS — Z1211 Encounter for screening for malignant neoplasm of colon: Secondary | ICD-10-CM | POA: Insufficient documentation

## 2015-05-11 DIAGNOSIS — Z800 Family history of malignant neoplasm of digestive organs: Secondary | ICD-10-CM | POA: Diagnosis not present

## 2015-05-11 DIAGNOSIS — E7800 Pure hypercholesterolemia, unspecified: Secondary | ICD-10-CM | POA: Insufficient documentation

## 2015-05-11 DIAGNOSIS — F329 Major depressive disorder, single episode, unspecified: Secondary | ICD-10-CM | POA: Diagnosis not present

## 2015-05-11 DIAGNOSIS — K648 Other hemorrhoids: Secondary | ICD-10-CM | POA: Diagnosis not present

## 2015-05-11 DIAGNOSIS — K2950 Unspecified chronic gastritis without bleeding: Secondary | ICD-10-CM | POA: Diagnosis not present

## 2015-05-11 DIAGNOSIS — K635 Polyp of colon: Secondary | ICD-10-CM | POA: Diagnosis not present

## 2015-05-11 DIAGNOSIS — M19041 Primary osteoarthritis, right hand: Secondary | ICD-10-CM | POA: Insufficient documentation

## 2015-05-11 DIAGNOSIS — Z859 Personal history of malignant neoplasm, unspecified: Secondary | ICD-10-CM | POA: Insufficient documentation

## 2015-05-11 DIAGNOSIS — K310 Acute dilatation of stomach: Secondary | ICD-10-CM | POA: Diagnosis not present

## 2015-05-11 DIAGNOSIS — Z79899 Other long term (current) drug therapy: Secondary | ICD-10-CM | POA: Insufficient documentation

## 2015-05-11 DIAGNOSIS — Z9049 Acquired absence of other specified parts of digestive tract: Secondary | ICD-10-CM | POA: Diagnosis not present

## 2015-05-11 DIAGNOSIS — Z90710 Acquired absence of both cervix and uterus: Secondary | ICD-10-CM | POA: Insufficient documentation

## 2015-05-11 DIAGNOSIS — Z7982 Long term (current) use of aspirin: Secondary | ICD-10-CM | POA: Insufficient documentation

## 2015-05-11 DIAGNOSIS — E785 Hyperlipidemia, unspecified: Secondary | ICD-10-CM | POA: Diagnosis not present

## 2015-05-11 DIAGNOSIS — Z87891 Personal history of nicotine dependence: Secondary | ICD-10-CM | POA: Insufficient documentation

## 2015-05-11 DIAGNOSIS — E119 Type 2 diabetes mellitus without complications: Secondary | ICD-10-CM | POA: Diagnosis not present

## 2015-05-11 DIAGNOSIS — K640 First degree hemorrhoids: Secondary | ICD-10-CM | POA: Insufficient documentation

## 2015-05-11 DIAGNOSIS — D123 Benign neoplasm of transverse colon: Secondary | ICD-10-CM | POA: Diagnosis not present

## 2015-05-11 DIAGNOSIS — D649 Anemia, unspecified: Secondary | ICD-10-CM | POA: Diagnosis not present

## 2015-05-11 DIAGNOSIS — Z96641 Presence of right artificial hip joint: Secondary | ICD-10-CM | POA: Insufficient documentation

## 2015-05-11 DIAGNOSIS — K210 Gastro-esophageal reflux disease with esophagitis: Secondary | ICD-10-CM | POA: Diagnosis not present

## 2015-05-11 DIAGNOSIS — K317 Polyp of stomach and duodenum: Secondary | ICD-10-CM | POA: Diagnosis not present

## 2015-05-11 DIAGNOSIS — K2270 Barrett's esophagus without dysplasia: Secondary | ICD-10-CM | POA: Diagnosis not present

## 2015-05-11 HISTORY — PX: ESOPHAGOGASTRODUODENOSCOPY (EGD) WITH PROPOFOL: SHX5813

## 2015-05-11 HISTORY — PX: COLONOSCOPY WITH PROPOFOL: SHX5780

## 2015-05-11 HISTORY — DX: Malignant (primary) neoplasm, unspecified: C80.1

## 2015-05-11 HISTORY — DX: Anemia complicating pregnancy, unspecified trimester: O99.019

## 2015-05-11 HISTORY — DX: Unspecified nephritic syndrome with unspecified morphologic changes: N05.9

## 2015-05-11 HISTORY — DX: Hyperlipidemia, unspecified: E78.5

## 2015-05-11 HISTORY — DX: Acquired absence of other specified parts of digestive tract: Z90.49

## 2015-05-11 LAB — GLUCOSE, CAPILLARY: GLUCOSE-CAPILLARY: 161 mg/dL — AB (ref 65–99)

## 2015-05-11 SURGERY — COLONOSCOPY WITH PROPOFOL
Anesthesia: General

## 2015-05-11 MED ORDER — GLYCOPYRROLATE 0.2 MG/ML IJ SOLN
INTRAMUSCULAR | Status: DC | PRN
  Administered 2015-05-11: 0.2 mg via INTRAVENOUS

## 2015-05-11 MED ORDER — VANCOMYCIN HCL IN DEXTROSE 750-5 MG/150ML-% IV SOLN
750.0000 mg | Freq: Once | INTRAVENOUS | Status: AC
  Administered 2015-05-11: 750 mg via INTRAVENOUS
  Filled 2015-05-11: qty 150

## 2015-05-11 MED ORDER — SODIUM CHLORIDE 0.9 % IV SOLN
INTRAVENOUS | Status: DC
  Administered 2015-05-11: 1000 mL via INTRAVENOUS

## 2015-05-11 MED ORDER — GENTAMICIN IN SALINE 1.6-0.9 MG/ML-% IV SOLN
80.0000 mg | Freq: Once | INTRAVENOUS | Status: AC
  Administered 2015-05-11: 80 mg via INTRAVENOUS
  Filled 2015-05-11: qty 50

## 2015-05-11 MED ORDER — EPHEDRINE SULFATE 50 MG/ML IJ SOLN
INTRAMUSCULAR | Status: DC | PRN
  Administered 2015-05-11: 5 mg via INTRAVENOUS
  Administered 2015-05-11: 10 mg via INTRAVENOUS

## 2015-05-11 MED ORDER — LIDOCAINE HCL (CARDIAC) 20 MG/ML IV SOLN
INTRAVENOUS | Status: DC | PRN
  Administered 2015-05-11: 100 mg via INTRAVENOUS

## 2015-05-11 MED ORDER — PROPOFOL 500 MG/50ML IV EMUL
INTRAVENOUS | Status: DC | PRN
  Administered 2015-05-11: 120 ug/kg/min via INTRAVENOUS

## 2015-05-11 MED ORDER — FENTANYL CITRATE (PF) 100 MCG/2ML IJ SOLN
INTRAMUSCULAR | Status: DC | PRN
  Administered 2015-05-11: 50 ug via INTRAVENOUS

## 2015-05-11 MED ORDER — PROPOFOL 10 MG/ML IV BOLUS
INTRAVENOUS | Status: DC | PRN
  Administered 2015-05-11: 40 mg via INTRAVENOUS
  Administered 2015-05-11: 20 mg via INTRAVENOUS

## 2015-05-11 NOTE — Anesthesia Preprocedure Evaluation (Signed)
Anesthesia Evaluation  Patient identified by MRN, date of birth, ID band Patient awake    Reviewed: Allergy & Precautions, NPO status , Patient's Chart, lab work & pertinent test results, reviewed documented beta blocker date and time   Airway Mallampati: II  TM Distance: >3 FB     Dental  (+) Chipped   Pulmonary former smoker,           Cardiovascular hypertension, Pt. on medications      Neuro/Psych PSYCHIATRIC DISORDERS Depression    GI/Hepatic GERD  ,  Endo/Other  diabetes, Type 2  Renal/GU Renal InsufficiencyRenal disease     Musculoskeletal  (+) Arthritis ,   Abdominal   Peds  Hematology  (+) anemia ,   Anesthesia Other Findings   Reproductive/Obstetrics                             Anesthesia Physical Anesthesia Plan  ASA: III  Anesthesia Plan: General   Post-op Pain Management:    Induction: Intravenous  Airway Management Planned: Nasal Cannula  Additional Equipment:   Intra-op Plan:   Post-operative Plan:   Informed Consent: I have reviewed the patients History and Physical, chart, labs and discussed the procedure including the risks, benefits and alternatives for the proposed anesthesia with the patient or authorized representative who has indicated his/her understanding and acceptance.     Plan Discussed with: CRNA  Anesthesia Plan Comments:         Anesthesia Quick Evaluation

## 2015-05-11 NOTE — Anesthesia Postprocedure Evaluation (Signed)
Anesthesia Post Note  Patient: Natasha Vasquez  Procedure(s) Performed: Procedure(s) (LRB): COLONOSCOPY WITH PROPOFOL (N/A) ESOPHAGOGASTRODUODENOSCOPY (EGD) WITH PROPOFOL  Patient location during evaluation: Endoscopy Anesthesia Type: General Level of consciousness: awake and alert Pain management: pain level controlled Vital Signs Assessment: post-procedure vital signs reviewed and stable Respiratory status: spontaneous breathing, nonlabored ventilation, respiratory function stable and patient connected to nasal cannula oxygen Cardiovascular status: blood pressure returned to baseline and stable Postop Assessment: no signs of nausea or vomiting Anesthetic complications: no    Last Vitals:  Filed Vitals:   05/11/15 1040 05/11/15 1050  BP: 119/63 115/62  Pulse: 78 69  Temp:    Resp: 14 14    Last Pain: There were no vitals filed for this visit.               Sheyla Zaffino S

## 2015-05-11 NOTE — Op Note (Signed)
[facility] Gastroenterology Patient Name: Natasha Vasquez Procedure Date: 05/11/2015 9:28 AM MRN: [id number] Account #: [id number] Date of Birth: May 17, 1940 Admit Type: Outpatient Age: 75 Room: [facility] ENDO ROOM 4 Gender: Female Note Status: Finalized Procedure:            Upper GI endoscopy Indications:          Epigastric abdominal pain, Heartburn Providers:            Manya Silvas, MD Referring MD:         Jerrell Belfast, MD (Referring MD) Medicines:            Propofol per Anesthesia Complications:        No immediate complications. Procedure:            Pre-Anesthesia Assessment:                       - After reviewing the risks and benefits, the patient                        was deemed in satisfactory condition to undergo the                        procedure.                       After obtaining informed consent, the endoscope was                        passed under direct vision. Throughout the procedure,                        the patient's blood pressure, pulse, and oxygen                        saturations were monitored continuously. The                        Colonoscope was introduced through the mouth, and                        advanced to the second part of duodenum. The upper GI                        endoscopy was accomplished without difficulty. The                        patient tolerated the procedure well. Findings:      LA Grade A (one or more mucosal breaks less than 5 mm, not extending       between tops of 2 mucosal folds) esophagitis with no bleeding was found       38 cm from the incisors. Biopsies were taken with a cold forceps for       histology.      Multiple small sessile polyps with no bleeding and no stigmata of recent       bleeding were found in the gastric body. Biopsies were taken with a cold       forceps for histology.      The entire examined stomach was normal. Biopsies were taken with a cold       forceps for  histology. Biopsies were  taken with a cold forceps for       Helicobacter pylori testing.      The examined duodenum was normal. Impression:           - LA Grade A reflux esophagitis. Biopsied.                       - Multiple gastric polyps. Biopsied.                       - Normal stomach. Biopsied.                       - Normal examined duodenum. Recommendation:       - Await pathology results. Manya Silvas, MD 05/11/2015 9:43:54 AM This report has been signed electronically. Number of Addenda: 0 Note Initiated On: 05/11/2015 9:28 AM      [facility]

## 2015-05-11 NOTE — H&P (Signed)
Primary Care Physician:  Margarita Rana, MD Primary Gastroenterologist:  Dr. Vira Agar  Pre-Procedure History & Physical: HPI:  Natasha Vasquez is a 75 y.o. female is here for an endoscopy and colonoscopy.   Past Medical History  Diagnosis Date  . Hypertension   . MVP (mitral valve prolapse)     followed by PCP  . Arthritis     hands  . Diabetes mellitus without complication (HCC)     diet controlled  . Torn meniscus     left  . Depression   . Hypercholesteremia   . Anemia in pregnancy   . Depression   . Hyperlipidemia   . Nephritis   . S/P appendectomy   . Cancer [facility])     Past Surgical History  Procedure Laterality Date  . Partial hip arthroplasty Right   . Abdominal hysterectomy    . Cholecystectomy    . Breast lumpectomy    . Brow lift Bilateral 02/01/2015    Procedure: BLEPHAROPLASTY bilateral upper eyelids.;  Surgeon: Karle Starch, MD;  Location: [place];  Service: Ophthalmology;  Laterality: Bilateral;  DIABETIC - diet controlled  . Tonsillectomy    . Appendectomy    . Esophagogastroduodenoscopy    . Colonoscopy    . Joint replacement      Prior to Admission medications   Medication Sig Start Date End Date Taking? Authorizing Provider  pantoprazole (PROTONIX) 40 MG tablet Take 40 mg by mouth 2 (two) times daily.   Yes Historical Provider, MD  acetaminophen (TYLENOL) 500 MG tablet Take 500 mg by mouth every 6 (six) hours as needed.    Historical Provider, MD  ARIPiprazole (ABILIFY) 2 MG tablet TAKE 1 TABLET EVERY EVENING 03/02/15   Margarita Rana, MD  aspirin 81 MG tablet Take 81 mg by mouth daily.    Historical Provider, MD  atorvastatin (LIPITOR) 20 MG tablet TAKE 1 TABLET BY MOUTH EVERY DAY 10/28/14   Margarita Rana, MD  bacitracin ophthalmic ointment Use on sutures 4 times a day for 12-14 days 02/01/15   Karle Starch, MD  lisinopril (PRINIVIL,ZESTRIL) 5 MG tablet TAKE 1 TABLET BY MOUTH EVERY DAY 01/22/15   Margarita Rana, MD  Multiple Vitamins-Minerals  ([employer] SPECTRAVITE PO) Take by mouth daily.    Historical Provider, MD  oxyCODONE-acetaminophen (PERCOCET) 5-325 MG tablet Take 1 tablet by mouth every 4 (four) hours as needed for severe pain. 02/01/15   Amy Dennie Maizes, MD  PRILOSEC OTC 20 MG tablet TAKE 1 TABLET BY MOUTH TWICE A DAY 08/06/14   Margarita Rana, MD  tolterodine (DETROL LA) 4 MG 24 hr capsule TAKE ONE CAPSULE BY MOUTH TWICE A DAY 10/18/14   Margarita Rana, MD  Venlafaxine HCl 150 MG TB24 TAKE 1 TABLET BY MOUTH EVERY DAY. USE TRAMADOL SPARINGLY WHILE ON THIS 11/22/14   Margarita Rana, MD    Allergies as of 04/16/2015 - Review Complete 02/01/2015  Allergen Reaction Noted  . Codeine Other (See Comments) 10/28/2014  . Naproxen Hives 10/28/2014  . Opium Other (See Comments) 10/28/2014  . Penicillins  10/28/2014    History reviewed. No pertinent family history.  Social History   Social History  . Marital Status: Married    Spouse Name: N/A  . Number of Children: N/A  . Years of Education: N/A   Occupational History  . Not on file.   Social History Main Topics  . Smoking status: Former Research scientist (life sciences)  . Smokeless tobacco: Not on file     Comment:  smoked in college  . Alcohol Use: 6.0 oz/week    10 Glasses of wine per week  . Drug Use: No  . Sexual Activity: Not on file   Other Topics Concern  . Not on file   Social History Narrative    Review of Systems: See HPI, otherwise negative ROS  Physical Exam: BP 123/93 mmHg  Pulse 98  Temp(Src) 97.4 F (36.3 C) (Tympanic)  Resp 17  Ht 5' 5.5" (1.664 m)  Wt 79.379 kg (175 lb)  BMI 28.67 kg/m2  SpO2 96% General:   Alert,  pleasant and cooperative in NAD Head:  Normocephalic and atraumatic. Neck:  Supple; no masses or thyromegaly. Lungs:  Clear throughout to auscultation.    Heart:  Regular rate and rhythm. Abdomen:  Soft, nontender and nondistended. Normal bowel sounds, without guarding, and without rebound.   Neurologic:  Alert and  oriented x4;  grossly normal  neurologically.  Impression/Plan: DIA LUC is here for an endoscopy and colonoscopy to be performed for FH colon cancer in mother, epigastric abdominal pain, GERD  Risks, benefits, limitations, and alternatives regarding  endoscopy and colonoscopy have been reviewed with the patient.  Questions have been answered.  All parties agreeable.   Gaylyn Cheers, MD  05/11/2015, 9:26 AM

## 2015-05-11 NOTE — Op Note (Signed)
[facility] Gastroenterology Patient Name: Gracianna Shytle Procedure Date: 05/11/2015 9:27 AM MRN: [id number] Account #: [id number] Date of Birth: 1940-06-07 Admit Type: Outpatient Age: 75 Room: [facility] ENDO ROOM 4 Gender: Female Note Status: Finalized Procedure:            Colonoscopy Indications:          Screening in patient at increased risk: Family history                        of 1st-degree relative with colorectal cancer Providers:            Manya Silvas, MD Referring MD:         Jerrell Belfast, MD (Referring MD) Medicines:            Propofol per Anesthesia Complications:        No immediate complications. Procedure:            Pre-Anesthesia Assessment:                       - After reviewing the risks and benefits, the patient                        was deemed in satisfactory condition to undergo the                        procedure.                       After obtaining informed consent, the colonoscope was                        passed under direct vision. Throughout the procedure,                        the patient's blood pressure, pulse, and oxygen                        saturations were monitored continuously. The                        Colonoscope was introduced through the anus and                        advanced to the the cecum, identified by appendiceal                        orifice and ileocecal valve. The colonoscopy was                        performed without difficulty. The patient tolerated the                        procedure well. The quality of the bowel preparation                        was good. Findings:      Colon somewhat difficult but able to pass to the cecum with the scope,       abd pressure applied at times.      A small polyp was found in the transverse colon. The polyp was sessile.  The polyp was removed with a cold snare. Resection and retrieval were       complete.      A diminutive polyp was found in the  splenic flexure. The polyp was       sessile. The polyp was removed with a jumbo cold forceps. Resection and       retrieval were complete.      A diminutive polyp was found in the sigmoid colon. The polyp was       sessile. The polyp was removed with a jumbo cold forceps. Resection and       retrieval were complete.      A few small-mouthed diverticula were found in the sigmoid colon.      Internal hemorrhoids were found during endoscopy. The hemorrhoids were       small and Grade I (internal hemorrhoids that do not prolapse).      The exam was otherwise without abnormality. Impression:           - One small polyp in the transverse colon, removed with                        a cold snare. Resected and retrieved.                       - One diminutive polyp at the splenic flexure, removed                        with a jumbo cold forceps. Resected and retrieved.                       - One diminutive polyp in the sigmoid colon, removed                        with a jumbo cold forceps. Resected and retrieved.                       - Diverticulosis in the sigmoid colon.                       - Internal hemorrhoids.                       - The examination was otherwise normal. Recommendation:       - Await pathology results. Manya Silvas, MD 05/11/2015 10:15:35 AM This report has been signed electronically. Number of Addenda: 0 Note Initiated On: 05/11/2015 9:27 AM Scope Withdrawal Time: 0 hours 14 minutes 20 seconds  Total Procedure Duration: 0 hours 24 minutes 30 seconds       [facility]

## 2015-05-11 NOTE — Anesthesia Procedure Notes (Signed)
Performed by: Matson Welch Pre-anesthesia Checklist: Patient identified, Emergency Drugs available, Suction available, Patient being monitored and Timeout performed Patient Re-evaluated:Patient Re-evaluated prior to inductionOxygen Delivery Method: Nasal cannula Intubation Type: IV induction       

## 2015-05-11 NOTE — Transfer of Care (Signed)
Immediate Anesthesia Transfer of Care Note  Patient: Natasha Vasquez  Procedure(s) Performed: Procedure(s): COLONOSCOPY WITH PROPOFOL (N/A) ESOPHAGOGASTRODUODENOSCOPY (EGD) WITH PROPOFOL  Patient Location: PACU  Anesthesia Type:General  Level of Consciousness: awake and alert   Airway & Oxygen Therapy: Patient Spontanous Breathing and Patient connected to nasal cannula oxygen  Post-op Assessment: Report given to RN and Post -op Vital signs reviewed and stable  Post vital signs: Reviewed and stable  Last Vitals:  Filed Vitals:   05/11/15 0706 05/11/15 1016  BP: 123/93   Pulse: 98 83  Temp: 36.3 C 36.6 C  Resp: 17 14    Last Pain: There were no vitals filed for this visit.       Complications: No apparent anesthesia complications

## 2015-05-12 ENCOUNTER — Encounter: Payer: Self-pay | Admitting: Unknown Physician Specialty

## 2015-05-12 LAB — SURGICAL PATHOLOGY

## 2015-05-18 DIAGNOSIS — E119 Type 2 diabetes mellitus without complications: Secondary | ICD-10-CM | POA: Insufficient documentation

## 2015-05-18 DIAGNOSIS — K209 Esophagitis, unspecified: Secondary | ICD-10-CM | POA: Diagnosis not present

## 2015-05-18 DIAGNOSIS — K219 Gastro-esophageal reflux disease without esophagitis: Secondary | ICD-10-CM | POA: Diagnosis not present

## 2015-05-18 DIAGNOSIS — K529 Noninfective gastroenteritis and colitis, unspecified: Secondary | ICD-10-CM | POA: Insufficient documentation

## 2015-05-18 DIAGNOSIS — D649 Anemia, unspecified: Secondary | ICD-10-CM | POA: Insufficient documentation

## 2015-06-08 ENCOUNTER — Encounter: Payer: Self-pay | Admitting: Physician Assistant

## 2015-06-08 ENCOUNTER — Other Ambulatory Visit: Payer: Self-pay

## 2015-06-08 ENCOUNTER — Ambulatory Visit
Admission: RE | Admit: 2015-06-08 | Discharge: 2015-06-08 | Disposition: A | Discharge status: Home / Self Care | Payer: Medicare Other | Source: Ambulatory Visit | Attending: Physician Assistant | Admitting: Physician Assistant

## 2015-06-08 ENCOUNTER — Ambulatory Visit (INDEPENDENT_AMBULATORY_CARE_PROVIDER_SITE_OTHER): Payer: Medicare Other | Admitting: Physician Assistant

## 2015-06-08 VITALS — BP 122/80 | HR 93 | Temp 97.9°F | Resp 16 | Wt 179.6 lb

## 2015-06-08 DIAGNOSIS — M1990 Unspecified osteoarthritis, unspecified site: Secondary | ICD-10-CM | POA: Insufficient documentation

## 2015-06-08 DIAGNOSIS — M25572 Pain in left ankle and joints of left foot: Secondary | ICD-10-CM

## 2015-06-08 DIAGNOSIS — M7989 Other specified soft tissue disorders: Secondary | ICD-10-CM

## 2015-06-08 DIAGNOSIS — E038 Other specified hypothyroidism: Secondary | ICD-10-CM | POA: Insufficient documentation

## 2015-06-08 DIAGNOSIS — D649 Anemia, unspecified: Secondary | ICD-10-CM

## 2015-06-08 DIAGNOSIS — E119 Type 2 diabetes mellitus without complications: Secondary | ICD-10-CM | POA: Insufficient documentation

## 2015-06-08 DIAGNOSIS — R252 Cramp and spasm: Secondary | ICD-10-CM | POA: Insufficient documentation

## 2015-06-08 DIAGNOSIS — M8580 Other specified disorders of bone density and structure, unspecified site: Secondary | ICD-10-CM | POA: Insufficient documentation

## 2015-06-08 DIAGNOSIS — B2700 Gammaherpesviral mononucleosis without complication: Secondary | ICD-10-CM | POA: Insufficient documentation

## 2015-06-08 DIAGNOSIS — E039 Hypothyroidism, unspecified: Secondary | ICD-10-CM | POA: Insufficient documentation

## 2015-06-08 DIAGNOSIS — D509 Iron deficiency anemia, unspecified: Secondary | ICD-10-CM | POA: Insufficient documentation

## 2015-06-08 DIAGNOSIS — I341 Nonrheumatic mitral (valve) prolapse: Secondary | ICD-10-CM | POA: Insufficient documentation

## 2015-06-08 DIAGNOSIS — D039 Melanoma in situ, unspecified: Secondary | ICD-10-CM | POA: Insufficient documentation

## 2015-06-08 NOTE — Addendum Note (Signed)
Addended by: Mar Daring on: 06/08/2015 02:06 PM   Modules accepted: Orders

## 2015-06-08 NOTE — Progress Notes (Signed)
Patient: Natasha Vasquez Female    DOB: Nov 29, 1940   75 y.o.   MRN: [id number] Visit Date: 06/08/2015  Today's Provider: Mar Daring, PA-C   Chief Complaint  Patient presents with  . Edema   Subjective:    HPI Edema: Patient is here today with c/o of swelling on legs and feet, mostly the left. She reports that she took a trip to [place] on 5/11 and returned on 5/19. She was in a middle seat and reports she could not stand and move around like she would have liked. She noticed the swelling starting while on the plane. The swelling did not decrease much during her whole vacation in [place], but she does report a lot of walking. She used tennis shoes for the vacation. The swelling remained and worsened again during the flight home. Since being home her R LE has returned to normal size (she is s/p hip replacement of the R hip). The swelling in the L LE is less but her ankle hurts when she walks now. She does still have some swelling in the L dorsal foot. She does state that her L ankle was bothering her a couple of days before the trip but she didn't think anything of it. Then after the swelling the left ankle/foot pain became worse. She reports that she sees Dr. Marry Guan at [facility] for her hip replacement (R LE) and torn posterior horn medial meniscus (L knee). She denies chest pain, headache, lightheadedness, visual changes, palpitations, SOB, orthopnea or dizziness. She does report having DOE but states she has had that since she was even a teenager. She does report feeling more fatigue now. She has 3 more trips coming in the summer (July) but [place]    Allergies  Allergen Reactions  . Aleve [Naproxen Sodium]   . Codeine Other (See Comments)    Chest pain  . Naproxen Hives  . Opium Other (See Comments)    Chest pain  . Oxycodone   . Penicillins     Can take Augmentin   Previous Medications   ACETAMINOPHEN (TYLENOL) 500 MG  TABLET    Take 500 mg by mouth every 6 (six) hours as needed.   ARIPIPRAZOLE (ABILIFY) 2 MG TABLET    TAKE 1 TABLET EVERY EVENING   ASPIRIN 81 MG TABLET    Take 81 mg by mouth daily.   ATORVASTATIN (LIPITOR) 20 MG TABLET    TAKE 1 TABLET BY MOUTH EVERY DAY   LISINOPRIL (PRINIVIL,ZESTRIL) 5 MG TABLET    TAKE 1 TABLET BY MOUTH EVERY DAY   MULTIPLE VITAMINS-MINERALS ([employer] SPECTRAVITE PO)    Take by mouth daily.   OXYCODONE-ACETAMINOPHEN (PERCOCET) 5-325 MG TABLET    Take 1 tablet by mouth every 4 (four) hours as needed for severe pain.   PANTOPRAZOLE (PROTONIX) 40 MG TABLET    Take 40 mg by mouth 2 (two) times daily.   POLYETHYLENE GLYCOL POWDER (GLYCOLAX/MIRALAX) POWDER    Reported on 06/08/2015   TOLTERODINE (DETROL LA) 4 MG 24 HR CAPSULE    TAKE ONE CAPSULE BY MOUTH TWICE A DAY   VENLAFAXINE HCL 150 MG TB24    TAKE 1 TABLET BY MOUTH EVERY DAY. USE TRAMADOL SPARINGLY WHILE ON THIS    Review of Systems  Constitutional: Positive for fatigue. Negative for fever, chills and appetite change.  HENT: Negative.   Respiratory: Negative for cough, chest tightness, shortness of breath and wheezing.  Cardiovascular: Positive for leg swelling. Negative for chest pain and palpitations.  Gastrointestinal: Negative for abdominal pain.  Musculoskeletal: Positive for joint swelling (left foot), arthralgias (left ankle, left knee, right hip) and gait problem (antalgic). Negative for myalgias, neck pain and neck stiffness.  Neurological: Negative for dizziness, weakness, light-headedness and headaches.    Social History  Substance Use Topics  . Smoking status: Former Research scientist (life sciences)  . Smokeless tobacco: Not on file     Comment: smoked in college  . Alcohol Use: 6.0 oz/week    10 Glasses of wine per week   Objective:   BP 122/80 mmHg  Pulse 93  Temp(Src) 97.9 F (36.6 C) (Oral)  Resp 16  Wt 179 lb 9.6 oz (81.466 kg)  Physical Exam  Constitutional: She appears well-developed and well-nourished. No distress.    Neck: Normal range of motion. Neck supple. No JVD present. No tracheal deviation present. No thyromegaly present.  Cardiovascular: Normal rate, regular rhythm and normal heart sounds.  Exam reveals no gallop and no friction rub.   No murmur heard. Pulses:      Dorsalis pedis pulses are 2+ on the right side, and 2+ on the left side.       Posterior tibial pulses are 2+ on the right side, and 2+ on the left side.  Pulmonary/Chest: Effort normal and breath sounds normal. No respiratory distress. She has no wheezes. She has no rales.  Musculoskeletal:       Right ankle: Normal. She exhibits normal range of motion, no swelling and normal pulse. No tenderness. Achilles tendon normal.       Left ankle: She exhibits swelling. She exhibits normal range of motion (discomfort on lateral aspect with inversion), no ecchymosis, no deformity and normal pulse. Tenderness (also tender over distal metatarsals). AITFL tenderness found. No lateral malleolus, no medial malleolus, no CF ligament, no posterior TFL, no head of 5th metatarsal and no proximal fibula tenderness found. Achilles tendon normal.  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  Neurological: She has normal strength. No sensory deficit.  Skin: She is not diaphoretic.  Vitals reviewed.       Assessment & Plan:     1. Swelling of left lower extremity I do want to rule out DVT due to history of travel. I will get venous ultrasound of left lower extremity. I will follow-up with her pending these results. I will also get at a x-ray of the left ankle due to swelling and tenderness to rule out any bony abnormality or arthritis as cause for the swelling of the left foot and ankle. I will also check labs as below to rule out any kidney involvement or heart failure. She does not have any heart failure symptoms nor has she ever had heart failure  so this is low on my differential. I will follow-up with her with all of these results and determine the best treatment plan at that time. - CBC with Differential - B Nat Peptide - Comprehensive Metabolic Panel (CMET) - DG Ankle Complete Left; Future - US Venous Img Lower Unilateral Left; Future  2. Left ankle pain See above medical treatment plan. - DG Ankle Complete Left; Future       Mar Daring, PA-C  [facility]

## 2015-06-09 ENCOUNTER — Telehealth: Payer: Self-pay

## 2015-06-09 LAB — COMPREHENSIVE METABOLIC PANEL
A/G RATIO: 2 (ref 1.2–2.2)
ALK PHOS: 95 IU/L (ref 39–117)
ALT: 12 IU/L (ref 0–32)
AST: 13 IU/L (ref 0–40)
Albumin: 4.7 g/dL (ref 3.5–4.8)
BUN/Creatinine Ratio: 16 (ref 12–28)
BUN: 16 mg/dL (ref 8–27)
Bilirubin Total: 0.3 mg/dL (ref 0.0–1.2)
CALCIUM: 9.9 mg/dL (ref 8.7–10.3)
CHLORIDE: 102 mmol/L (ref 96–106)
CO2: 25 mmol/L (ref 18–29)
Creatinine, Ser: 0.98 mg/dL (ref 0.57–1.00)
GFR calc Af Amer: 65 mL/min/{1.73_m2} (ref >59)
GFR, EST NON AFRICAN AMERICAN: 57 mL/min/{1.73_m2} — AB (ref >59)
Globulin, Total: 2.3 g/dL (ref 1.5–4.5)
Glucose: 100 mg/dL — ABNORMAL HIGH (ref 65–99)
POTASSIUM: 4.8 mmol/L (ref 3.5–5.2)
Sodium: 145 mmol/L — ABNORMAL HIGH (ref 134–144)
Total Protein: 7 g/dL (ref 6.0–8.5)

## 2015-06-09 LAB — CBC WITH DIFFERENTIAL/PLATELET
BASOS ABS: 0.1 10*3/uL (ref 0.0–0.2)
Basos: 1 %
EOS (ABSOLUTE): 0.2 10*3/uL (ref 0.0–0.4)
Eos: 4 %
Hematocrit: 33.8 % — ABNORMAL LOW (ref 34.0–46.6)
Hemoglobin: 10.4 g/dL — ABNORMAL LOW (ref 11.1–15.9)
IMMATURE GRANS (ABS): 0 10*3/uL (ref 0.0–0.1)
IMMATURE GRANULOCYTES: 1 %
LYMPHS: 28 %
Lymphocytes Absolute: 1.7 10*3/uL (ref 0.7–3.1)
MCH: 23.6 pg — AB (ref 26.6–33.0)
MCHC: 30.8 g/dL — ABNORMAL LOW (ref 31.5–35.7)
MCV: 77 fL — ABNORMAL LOW (ref 79–97)
Monocytes Absolute: 0.6 10*3/uL (ref 0.1–0.9)
Monocytes: 10 %
NEUTROS ABS: 3.4 10*3/uL (ref 1.4–7.0)
NEUTROS PCT: 56 %
PLATELETS: 441 10*3/uL — AB (ref 150–379)
RBC: 4.4 x10E6/uL (ref 3.77–5.28)
RDW: 16.7 % — AB (ref 12.3–15.4)
WBC: 5.9 10*3/uL (ref 3.4–10.8)

## 2015-06-09 LAB — BRAIN NATRIURETIC PEPTIDE: BNP: 73.5 pg/mL (ref 0.0–100.0)

## 2015-06-09 NOTE — Telephone Encounter (Signed)
Patient advised as directed below. Voiced understanding.  Thanks,  -Joseline 

## 2015-06-09 NOTE — Telephone Encounter (Signed)
-----   Message from Mar Daring, [place] sent at 06/09/2015 11:51 AM EDT ----- All labs are within normal limits and stable.  Xrays did not show any arthritis or fractures just some anterior swelling that could be tendinitis secondary to increased walking recently that was exacerbated by swelling from having your legs dependent during the flight. Anti-inflammatories, such as IBU, may help the swelling. Using an ankle brace may help limit some motion as well. Rest, compression and elevation. May use ice and heat over the area. If no relief in 2 weeks please let me know and will consider referral to podiatry for further evaluation.Thanks! -JB

## 2015-06-18 ENCOUNTER — Other Ambulatory Visit: Payer: Self-pay | Admitting: Family Medicine

## 2015-06-18 DIAGNOSIS — F32A Depression, unspecified: Secondary | ICD-10-CM

## 2015-06-18 DIAGNOSIS — F329 Major depressive disorder, single episode, unspecified: Secondary | ICD-10-CM

## 2015-06-20 ENCOUNTER — Other Ambulatory Visit: Payer: Self-pay | Admitting: Family Medicine

## 2015-06-20 DIAGNOSIS — I10 Essential (primary) hypertension: Secondary | ICD-10-CM

## 2015-06-21 ENCOUNTER — Other Ambulatory Visit: Payer: Self-pay

## 2015-06-21 DIAGNOSIS — F4322 Adjustment disorder with anxiety: Secondary | ICD-10-CM

## 2015-06-21 MED ORDER — ARIPIPRAZOLE 2 MG PO TABS: 2.0000 mg | ORAL_TABLET | Freq: Every evening | ORAL | Status: DC

## 2015-06-24 ENCOUNTER — Other Ambulatory Visit: Payer: Self-pay | Admitting: Family Medicine

## 2015-06-24 DIAGNOSIS — F4322 Adjustment disorder with anxiety: Secondary | ICD-10-CM

## 2015-07-29 ENCOUNTER — Other Ambulatory Visit: Payer: Self-pay | Admitting: Family Medicine

## 2015-07-29 DIAGNOSIS — I10 Essential (primary) hypertension: Secondary | ICD-10-CM

## 2015-10-16 ENCOUNTER — Other Ambulatory Visit: Payer: Self-pay | Admitting: Family Medicine

## 2015-10-16 DIAGNOSIS — E7800 Pure hypercholesterolemia, unspecified: Secondary | ICD-10-CM

## 2015-10-25 ENCOUNTER — Other Ambulatory Visit: Payer: Self-pay | Admitting: Family Medicine

## 2015-10-25 DIAGNOSIS — F4322 Adjustment disorder with anxiety: Secondary | ICD-10-CM

## 2015-11-22 DIAGNOSIS — D692 Other nonthrombocytopenic purpura: Secondary | ICD-10-CM | POA: Diagnosis not present

## 2015-11-22 DIAGNOSIS — L570 Actinic keratosis: Secondary | ICD-10-CM | POA: Diagnosis not present

## 2015-11-22 DIAGNOSIS — X32XXXA Exposure to sunlight, initial encounter: Secondary | ICD-10-CM | POA: Diagnosis not present

## 2015-11-22 DIAGNOSIS — D485 Neoplasm of uncertain behavior of skin: Secondary | ICD-10-CM | POA: Diagnosis not present

## 2015-11-22 DIAGNOSIS — Z85820 Personal history of malignant melanoma of skin: Secondary | ICD-10-CM | POA: Diagnosis not present

## 2015-11-22 DIAGNOSIS — L821 Other seborrheic keratosis: Secondary | ICD-10-CM | POA: Diagnosis not present

## 2015-11-22 DIAGNOSIS — D0471 Carcinoma in situ of skin of right lower limb, including hip: Secondary | ICD-10-CM | POA: Diagnosis not present

## 2015-12-21 ENCOUNTER — Other Ambulatory Visit: Payer: Self-pay | Admitting: Family Medicine

## 2016-01-03 DIAGNOSIS — E119 Type 2 diabetes mellitus without complications: Secondary | ICD-10-CM | POA: Diagnosis not present

## 2016-01-03 LAB — HM DIABETES EYE EXAM

## 2016-01-24 ENCOUNTER — Encounter: Payer: Self-pay | Admitting: Physician Assistant

## 2016-01-24 ENCOUNTER — Ambulatory Visit (INDEPENDENT_AMBULATORY_CARE_PROVIDER_SITE_OTHER): Payer: Medicare Other | Admitting: Physician Assistant

## 2016-01-24 VITALS — BP 130/70 | HR 72 | Temp 98.1°F | Resp 16 | Ht 66.0 in | Wt 181.6 lb

## 2016-01-24 DIAGNOSIS — E119 Type 2 diabetes mellitus without complications: Secondary | ICD-10-CM | POA: Diagnosis not present

## 2016-01-24 DIAGNOSIS — E7800 Pure hypercholesterolemia, unspecified: Secondary | ICD-10-CM

## 2016-01-24 DIAGNOSIS — Z780 Asymptomatic menopausal state: Secondary | ICD-10-CM

## 2016-01-24 DIAGNOSIS — N3281 Overactive bladder: Secondary | ICD-10-CM | POA: Diagnosis not present

## 2016-01-24 DIAGNOSIS — F4322 Adjustment disorder with anxiety: Secondary | ICD-10-CM | POA: Diagnosis not present

## 2016-01-24 DIAGNOSIS — Z0000 Encounter for general adult medical examination without abnormal findings: Secondary | ICD-10-CM | POA: Diagnosis not present

## 2016-01-24 DIAGNOSIS — I10 Essential (primary) hypertension: Secondary | ICD-10-CM | POA: Diagnosis not present

## 2016-01-24 DIAGNOSIS — F3342 Major depressive disorder, recurrent, in full remission: Secondary | ICD-10-CM

## 2016-01-24 DIAGNOSIS — D649 Anemia, unspecified: Secondary | ICD-10-CM | POA: Diagnosis not present

## 2016-01-24 DIAGNOSIS — Z1231 Encounter for screening mammogram for malignant neoplasm of breast: Secondary | ICD-10-CM

## 2016-01-24 DIAGNOSIS — M8589 Other specified disorders of bone density and structure, multiple sites: Secondary | ICD-10-CM

## 2016-01-24 DIAGNOSIS — E039 Hypothyroidism, unspecified: Secondary | ICD-10-CM

## 2016-01-24 DIAGNOSIS — E038 Other specified hypothyroidism: Secondary | ICD-10-CM

## 2016-01-24 DIAGNOSIS — K219 Gastro-esophageal reflux disease without esophagitis: Secondary | ICD-10-CM

## 2016-01-24 DIAGNOSIS — Z1239 Encounter for other screening for malignant neoplasm of breast: Secondary | ICD-10-CM

## 2016-01-24 DIAGNOSIS — Z23 Encounter for immunization: Secondary | ICD-10-CM

## 2016-01-24 MED ORDER — VENLAFAXINE HCL ER 150 MG PO TB24: ORAL_TABLET | ORAL | 1 refills | Status: DC

## 2016-01-24 MED ORDER — ARIPIPRAZOLE 2 MG PO TABS: 2.0000 mg | ORAL_TABLET | Freq: Every evening | ORAL | 1 refills | Status: DC

## 2016-01-24 MED ORDER — ATORVASTATIN CALCIUM 20 MG PO TABS
20.0000 mg | ORAL_TABLET | Freq: Every day | ORAL | 1 refills | Status: DC
Start: 2016-01-24 — End: 2016-10-11

## 2016-01-24 MED ORDER — PANTOPRAZOLE SODIUM 40 MG PO TBEC
40.0000 mg | DELAYED_RELEASE_TABLET | Freq: Every day | ORAL | 1 refills | Status: DC
Start: 2016-01-24 — End: 2016-09-01

## 2016-01-24 MED ORDER — LISINOPRIL 5 MG PO TABS: 5.0000 mg | ORAL_TABLET | Freq: Every day | ORAL | 1 refills | Status: DC

## 2016-01-24 MED ORDER — OXYBUTYNIN CHLORIDE ER 10 MG PO TB24: 10.0000 mg | ORAL_TABLET | Freq: Every day | ORAL | 1 refills | Status: DC

## 2016-01-24 NOTE — Progress Notes (Signed)
Patient: Natasha Vasquez, Female    DOB: 10-15-1940, 76 y.o.   MRN: [id number] Visit Date: 01/24/2016  Today's Provider: Mar Daring, PA-C   Chief Complaint  Patient presents with  . Medicare Wellness   Subjective:    Annual wellness visit Natasha Vasquez is a 76 y.o. female. She feels well. She reports exercising. She reports she is sleeping well.  Mammogram: 10/08/2013 BI-RADS 1 Colon: 05/11/15 Polyps,Diverticulosis, Internal Hemorrhoids. -----------------------------------------------------------   Review of Systems  Constitutional: Negative.   HENT: Negative.   Eyes: Negative.   Respiratory: Negative.   Cardiovascular: Negative.   Gastrointestinal: Negative.   Endocrine: Negative.   Genitourinary: Negative.   Musculoskeletal: Negative.   Skin: Negative.   Allergic/Immunologic: Negative.   Neurological: Negative.   Hematological: Negative.   Psychiatric/Behavioral: Negative.     Social History   Social History  . Marital status: Married    Spouse name: N/A  . Number of children: N/A  . Years of education: N/A   Occupational History  . Not on file.   Social History Main Topics  . Smoking status: Former Research scientist (life sciences)  . Smokeless tobacco: Never Used     Comment: smoked in college  . Alcohol use 6.0 oz/week    10 Glasses of wine per week  . Drug use: No  . Sexual activity: No   Other Topics Concern  . Not on file   Social History Narrative  . No narrative on file    Past Medical History:  Diagnosis Date  . Anemia in pregnancy   . Arthritis    hands  . Cancer ([place])   . Depression   . Depression   . Diabetes mellitus without complication (HCC)    diet controlled  . Hypercholesteremia   . Hyperlipidemia   . Hypertension   . MVP (mitral valve prolapse)    followed by PCP  . Nephritis   . S/P appendectomy   . Torn meniscus    left     Patient Active Problem List   Diagnosis Date Noted  . Subclinical hypothyroidism 06/08/2015  .  Osteopenia 06/08/2015  . Billowing mitral valve 06/08/2015  . Melanoma in situ ([place]) 06/08/2015  . Cramps of lower extremity 06/08/2015  . EBV positive mononucleosis syndrome 06/08/2015  . Diabetes mellitus ([place]) 06/08/2015  . Arthritis 06/08/2015  . Absolute anemia 06/08/2015  . Type 2 diabetes mellitus ([place]) 05/18/2015  . Closed fracture of neck of right femur ([place]) 03/25/2015  . Clinical depression 01/26/2015  . Adjustment reaction 11/22/2014  . Hypercholesterolemia 10/28/2014  . Essential hypertension 09/27/2014  . GERD (gastroesophageal reflux disease) 08/06/2014  . Urinary frequency 07/16/2014  . Bell palsy 04/24/2004    Past Surgical History:  Procedure Laterality Date  . ABDOMINAL HYSTERECTOMY    . APPENDECTOMY    . BREAST LUMPECTOMY    . BROW LIFT Bilateral 02/01/2015   Procedure: BLEPHAROPLASTY bilateral upper eyelids.;  Surgeon: Karle Starch, MD;  Location: [facility];  Service: Ophthalmology;  Laterality: Bilateral;  DIABETIC - diet controlled  . CHOLECYSTECTOMY    . COLONOSCOPY    . COLONOSCOPY WITH PROPOFOL N/A 05/11/2015   Procedure: COLONOSCOPY WITH PROPOFOL;  Surgeon: Manya Silvas, MD;  Location[facility] ENDOSCOPY;  Service: Endoscopy;  Laterality: N/A;  . ESOPHAGOGASTRODUODENOSCOPY    . ESOPHAGOGASTRODUODENOSCOPY (EGD) WITH PROPOFOL  05/11/2015   Procedure: ESOPHAGOGASTRODUODENOSCOPY (EGD) WITH PROPOFOL;  Surgeon: Manya Silvas, MD;  Location: [facility] ENDOSCOPY;  Service: Endoscopy;;  .  JOINT REPLACEMENT    . PARTIAL HIP ARTHROPLASTY Right   . TONSILLECTOMY      Her family history is not on file.      Current Outpatient Prescriptions:  .  acetaminophen (TYLENOL) 500 MG tablet, Take 500 mg by mouth every 6 (six) hours as needed., Disp: , Rfl:  .  ARIPiprazole (ABILIFY) 2 MG tablet, TAKE 1 TABLET (2 MG TOTAL) BY MOUTH EVERY EVENING., Disp: 90 tablet, Rfl: 1 .  aspirin 81 MG tablet, Take 81 mg by mouth daily., Disp: , Rfl:  .  atorvastatin (LIPITOR)  20 MG tablet, TAKE 1 TABLET BY MOUTH EVERY DAY, Disp: 90 tablet, Rfl: 1 .  Calcium Carbonate Antacid (TUMS SMOOTHIES PO), Take by mouth., Disp: , Rfl:  .  lisinopril (PRINIVIL,ZESTRIL) 5 MG tablet, TAKE 1 TABLET BY MOUTH EVERY DAY, Disp: 90 tablet, Rfl: 1 .  pantoprazole (PROTONIX) 40 MG tablet, Take 40 mg by mouth 2 (two) times daily., Disp: , Rfl:  .  Venlafaxine HCl 150 MG TB24, TAKE 1 TABLET BY MOUTH EVERY DAY. USE TRAMADOL SPARINGLY WHILE ON THIS, Disp: 90 tablet, Rfl: 1 .  Multiple Vitamins-Minerals (CVS SPECTRAVITE PO), Take by mouth daily., Disp: , Rfl:  .  tolterodine (DETROL LA) 4 MG 24 hr capsule, TAKE ONE CAPSULE BY MOUTH TWICE A DAY (Patient not taking: Reported on 01/24/2016), Disp: 180 capsule, Rfl: 3  Patient Care Team: Mar Daring, PA-C as PCP - General (Family Medicine)     Objective:   Vitals: BP 130/70 (BP Location: Right Arm, Patient Position: Sitting, Cuff Size: Normal)   Pulse 72   Temp 98.1 F (36.7 C) (Oral)   Resp 16   Ht 5\' 6"  (1.676 m)   Wt 181 lb 9.6 oz (82.4 kg)   BMI 29.31 kg/m   Physical Exam  Constitutional: She is oriented to person, place, and time. She appears well-developed and well-nourished. No distress.  HENT:  Head: Normocephalic and atraumatic.  Right Ear: Hearing, tympanic membrane, external ear and ear canal normal.  Left Ear: Hearing, tympanic membrane, external ear and ear canal normal.  Nose: Nose normal.  Mouth/Throat: Uvula is midline, oropharynx is clear and moist and mucous membranes are normal. No oropharyngeal exudate.  Eyes: Conjunctivae and EOM are normal. Pupils are equal, round, and reactive to light. Right eye exhibits no discharge. Left eye exhibits no discharge. No scleral icterus.  Neck: Normal range of motion. Neck supple. No JVD present. Carotid bruit is not present. No tracheal deviation present. No thyromegaly present.  Cardiovascular: Normal rate, regular rhythm, normal heart sounds and intact distal pulses.   Exam reveals no gallop and no friction rub.   No murmur heard. Pulmonary/Chest: Effort normal and breath sounds normal. No respiratory distress. She has no decreased breath sounds. She has no wheezes. She has no rales. She exhibits no tenderness.  Abdominal: Soft. Bowel sounds are normal. She exhibits no distension and no mass. There is no tenderness. There is no rebound and no guarding.  Musculoskeletal: Normal range of motion. She exhibits no edema or tenderness.  Lymphadenopathy:    She has no cervical adenopathy.  Neurological: She is alert and oriented to person, place, and time.  Skin: Skin is warm and dry. No rash noted. She is not diaphoretic.  Psychiatric: She has a normal mood and affect. Her behavior is normal. Judgment and thought content normal.  Vitals reviewed.   Activities of Daily Living In your present state of health, do you have any  difficulty performing the following activities: 01/24/2016  Hearing? N  Vision? N  Difficulty concentrating or making decisions? N  Walking or climbing stairs? N  Dressing or bathing? N  Doing errands, shopping? N  Some recent data might be hidden    Fall Risk Assessment Fall Risk  01/24/2016  Falls in the past year? Yes  Number falls in past yr: 2 or more     Depression Screen PHQ 2/9 Scores 01/24/2016  PHQ - 2 Score 0    Cognitive Testing - 6-CIT  Correct? Score   What year is it? yes 0 0 or 4  What month is it? yes 0 0 or 3  Memorize:    Pia Mau,  42,  [place],      What time is it? (within 1 hour) yes 0 0 or 3  Count backwards from 20 yes 0 0, 2, or 4  Name the months of the year yes 0 0, 2, or 4  Repeat name & address above no 3 0, 2, 4, 6, 8, or 10       TOTAL SCORE  3/28   Interpretation:  Normal  Normal (0-7) Abnormal (8-28)   Audit-C Alcohol Use Screening  Question Answer Points  How often do you have alcoholic drink? 4 times weekly 4  On days you do drink alcohol, how many drinks do you typically  consume? 1-2 0  How oftey will you drink 6 or more in a total? never 0  Total Score:  4   A score of 3 or more in women, and 4 or more in men indicates increased risk for alcohol abuse, EXCEPT if all of the points are from question 1.     Assessment & Plan:     Annual Wellness Visit  Reviewed patient's Family Medical History Reviewed and updated list of patient's medical providers Assessment of cognitive impairment was done Assessed patient's functional ability Established a written schedule for health screening [place] Completed and Reviewed  Exercise Activities and Dietary recommendations Goals    None       There is no immunization history on file for this patient.  Health Maintenance  Topic Date Due  . HEMOGLOBIN A1C  11/17/40  . FOOT EXAM  03/17/1950  . TETANUS/TDAP  03/17/1959  . ZOSTAVAX  03/16/2000  . DEXA SCAN  03/16/2005  . PNA vac Low Risk Adult (1 of 2 - PCV13) 03/16/2005  . INFLUENZA VACCINE  08/16/2015  . OPHTHALMOLOGY EXAM  01/02/2017  . COLONOSCOPY  05/10/2025     Discussed health benefits of physical activity, and encouraged her to engage in regular exercise appropriate for her age and condition.    1. Medicare annual wellness visit, subsequent Normal physical exam today.  2. Breast cancer screening There is no family history of breast cancer. She does perform regular self breast exams. Mammogram was ordered as below. Information for [facility] was given to patient so she may schedule her mammogram at her convenience. - MM Digital Screening; Future  3. Osteopenia of multiple sites History of osteopenia noted on previous bone density scan that was done in 2015. She uses Tums for her calcium source. - DG Bone Density; Future  4. Postmenopausal estrogen deficiency See above medical treatment plan. - DG Bone Density; Future  5. Need for pneumococcal vaccination Pneumococcal-23 vaccine given to patient  without complications. Patient sat for 15 minutes after administration and was tolerated well without adverse effects. -  Pneumococcal polysaccharide vaccine 23-valent greater than or equal to 2yo subcutaneous/IM  6. Need for influenza vaccination Flu vaccine given today without complication. Patient sat upright for 15 minutes to check for adverse reaction before being released. - Flu vaccine HIGH DOSE PF  7. Essential hypertension Stable. Diagnosis pulled for medication refill. Continue current medical treatment plan. Will check labs as below and f/u pending results. - lisinopril (PRINIVIL,ZESTRIL) 5 MG tablet; Take 1 tablet (5 mg total) by mouth daily.  Dispense: 90 tablet; Refill: 1 - CBC w/Diff/Platelet - Comprehensive Metabolic Panel (CMET)  8. Type 2 diabetes mellitus without complication, without long-term current use of insulin (HCC) Stable. Diet controlled. Continue current medical treatment plan. Will check labs as below and f/u pending results. - CBC w/Diff/Platelet - Comprehensive Metabolic Panel (CMET) - HgB A1c  9. Subclinical hypothyroidism Will check labs as below and f/u pending results. - TSH  10. Anemia, unspecified type Will check labs as below and f/u pending results. - CBC w/Diff/Platelet  11. Hypercholesterolemia Stable. Diagnosis pulled for medication refill. Continue current medical treatment plan. Will check labs as below and f/u pending results. - atorvastatin (LIPITOR) 20 MG tablet; Take 1 tablet (20 mg total) by mouth daily.  Dispense: 90 tablet; Refill: 1 - Comprehensive Metabolic Panel (CMET) - Lipid Profile  12. OAB (overactive bladder) Patient has been on Detrol LA 4 mg twice daily but states that medication is very expensive now. We'll switch to oxybutynin as below for cost. Patient is to call if medication is expensive for ineffective. - oxybutynin (DITROPAN-XL) 10 MG 24 hr tablet; Take 1 tablet (10 mg total) by mouth at bedtime.  Dispense: 90  tablet; Refill: 1  13. Adjustment disorder with anxious mood Stable. Diagnosis pulled for medication refill. Continue current medical treatment plan. - ARIPiprazole (ABILIFY) 2 MG tablet; Take 1 tablet (2 mg total) by mouth every evening.  Dispense: 90 tablet; Refill: 1  14. Gastroesophageal reflux disease, esophagitis presence not specified Stable. Diagnosis pulled for medication refill. Continue current medical treatment plan. - pantoprazole (PROTONIX) 40 MG tablet; Take 1 tablet (40 mg total) by mouth daily.  Dispense: 90 tablet; Refill: 1  15. Recurrent major depressive disorder, in full remission ([place]) Stable. Diagnosis pulled for medication refill. Continue current medical treatment plan. - Venlafaxine HCl 150 MG TB24; TAKE 1 TABLET BY MOUTH EVERY DAY. USE TRAMADOL SPARINGLY WHILE ON THIS  Dispense: 90 tablet; Refill: 1  ------------------------------------------------------------------------------------------------------------    Mar Daring, PA-C  [facility]

## 2016-02-21 ENCOUNTER — Other Ambulatory Visit: Payer: Self-pay

## 2016-02-28 ENCOUNTER — Other Ambulatory Visit: Payer: Self-pay

## 2016-02-28 MED ORDER — OMEPRAZOLE 20 MG PO CPDR: 20.0000 mg | DELAYED_RELEASE_CAPSULE | Freq: Two times a day (BID) | ORAL | 3 refills | Status: DC

## 2016-02-28 NOTE — Telephone Encounter (Signed)
[facility]  QTY: 180 R:3

## 2016-02-28 NOTE — Telephone Encounter (Signed)
Last ov 01/24/16 Please review. Thank you. sd

## 2016-03-01 ENCOUNTER — Encounter
Admission: RE | Admit: 2016-03-01 | Discharge: 2016-03-01 | Disposition: A | Discharge status: Home / Self Care | Payer: Medicare Other | Source: Ambulatory Visit | Attending: Orthopedic Surgery | Admitting: Orthopedic Surgery

## 2016-03-01 DIAGNOSIS — I10 Essential (primary) hypertension: Secondary | ICD-10-CM | POA: Insufficient documentation

## 2016-03-01 DIAGNOSIS — Z01810 Encounter for preprocedural cardiovascular examination: Secondary | ICD-10-CM | POA: Insufficient documentation

## 2016-03-01 DIAGNOSIS — E119 Type 2 diabetes mellitus without complications: Secondary | ICD-10-CM | POA: Diagnosis not present

## 2016-03-01 DIAGNOSIS — Z01812 Encounter for preprocedural laboratory examination: Secondary | ICD-10-CM | POA: Insufficient documentation

## 2016-03-01 HISTORY — DX: Gastro-esophageal reflux disease without esophagitis: K21.9

## 2016-03-01 HISTORY — DX: Barrett's esophagus without dysplasia: K22.70

## 2016-03-01 HISTORY — DX: Family history of other specified conditions: Z84.89

## 2016-03-01 LAB — CBC
HCT: 33.1 % — ABNORMAL LOW (ref 35.0–47.0)
Hemoglobin: 10.4 g/dL — ABNORMAL LOW (ref 12.0–16.0)
MCH: 22.2 pg — ABNORMAL LOW (ref 26.0–34.0)
MCHC: 31.4 g/dL — ABNORMAL LOW (ref 32.0–36.0)
MCV: 70.6 fL — AB (ref 80.0–100.0)
PLATELETS: 381 10*3/uL (ref 150–440)
RBC: 4.68 MIL/uL (ref 3.80–5.20)
RDW: 17.9 % — ABNORMAL HIGH (ref 11.5–14.5)
WBC: 6.5 10*3/uL (ref 3.6–11.0)

## 2016-03-01 NOTE — Patient Instructions (Addendum)
Your procedure is scheduled on: Wednesday 03/14/16 Report to [place]. 2ND FLOOR MEDICAL MALL ENTRANCE. To find out your arrival time please call [phone] between 1PM - 3PM on Tuesday 03/13/16.  Remember: Instructions that are not followed completely may result in serious medical risk, up to and including death, or upon the discretion of your surgeon and anesthesiologist your surgery may need to be rescheduled.    __X__ 1. Do not eat food or drink liquids after midnight. No gum chewing or hard candies.     __X__ 2. No Alcohol for 24 hours before or after surgery.   ____ 3. Bring all medications with you on the day of surgery if instructed.    __X__ 4. Notify your doctor if there is any change in your medical condition     (cold, fever, infections).             ___X__5. No smoking within 24 hours of your surgery.     Do not wear jewelry, make-up, hairpins, clips or nail polish.  Do not wear lotions, powders, or perfumes.   Do not shave 48 hours prior to surgery. Men may shave face and neck.  Do not bring valuables to the hospital.    [facility] is not responsible for any belongings or valuables.               Contacts, dentures or bridgework may not be worn into surgery.  Leave your suitcase in the car. After surgery it may be brought to your room.  For patients admitted to the hospital, discharge time is determined by your                treatment team.   Patients discharged the day of surgery will not be allowed to drive home.   Please read over the following fact sheets that you were given:   Pain Booklet and MRSA Information   __X__ Take these medicines the morning of surgery with A SIP OF WATER:    1. LISINOPRIL  2. ATORVASTATIN  3. PROTONIX  4. PRILOSEC  5. PEPCID  6. VENLAFAXINE  ____ Fleet Enema (as directed)   __X__ Use CHG Soap as directed  ____ Use inhalers on the day of surgery  ____ Stop metformin 2 days prior to surgery    ____ Take 1/2 of usual  insulin dose the night before surgery and none on the morning of surgery.   __X__ Stop Coumadin/Plavix/aspirin on 03/06/16  __X__ Stop Anti-inflammatories such as Advil, Aleve, Ibuprofen, Motrin, Naproxen, Naprosyn, Goodies,powder, or aspirin products.  OK to take Tylenol.   __X__ Stop supplements until after surgery.  GARCINIA [place]BO[place]IA  ____ Bring C-Pap to the hospital.

## 2016-03-02 ENCOUNTER — Telehealth: Payer: Self-pay

## 2016-03-02 LAB — LIPID PANEL
CHOLESTEROL TOTAL: 189 mg/dL (ref 100–199)
Chol/HDL Ratio: 4.7 ratio units — ABNORMAL HIGH (ref 0.0–4.4)
HDL: 40 mg/dL (ref >39)
LDL CALC: 106 mg/dL — AB (ref 0–99)
Triglycerides: 217 mg/dL — ABNORMAL HIGH (ref 0–149)
VLDL CHOLESTEROL CAL: 43 mg/dL — AB (ref 5–40)

## 2016-03-02 LAB — COMPREHENSIVE METABOLIC PANEL
ALBUMIN: 4.4 g/dL (ref 3.5–4.8)
ALT: 11 IU/L (ref 0–32)
AST: 12 IU/L (ref 0–40)
Albumin/Globulin Ratio: 1.8 (ref 1.2–2.2)
Alkaline Phosphatase: 91 IU/L (ref 39–117)
BILIRUBIN TOTAL: 0.2 mg/dL (ref 0.0–1.2)
BUN / CREAT RATIO: 22 (ref 12–28)
BUN: 18 mg/dL (ref 8–27)
CHLORIDE: 102 mmol/L (ref 96–106)
CO2: 25 mmol/L (ref 18–29)
CREATININE: 0.83 mg/dL (ref 0.57–1.00)
Calcium: 9.5 mg/dL (ref 8.7–10.3)
GFR calc non Af Amer: 69 mL/min/{1.73_m2} (ref >59)
GFR, EST AFRICAN AMERICAN: 80 mL/min/{1.73_m2} (ref >59)
GLUCOSE: 111 mg/dL — AB (ref 65–99)
Globulin, Total: 2.5 g/dL (ref 1.5–4.5)
Potassium: 4.6 mmol/L (ref 3.5–5.2)
Sodium: 144 mmol/L (ref 134–144)
TOTAL PROTEIN: 6.9 g/dL (ref 6.0–8.5)

## 2016-03-02 LAB — CBC WITH DIFFERENTIAL/PLATELET
BASOS: 1 %
Basophils Absolute: 0.1 10*3/uL (ref 0.0–0.2)
EOS (ABSOLUTE): 0.2 10*3/uL (ref 0.0–0.4)
EOS: 3 %
HEMOGLOBIN: 10.7 g/dL — AB (ref 11.1–15.9)
Hematocrit: 35.9 % (ref 34.0–46.6)
IMMATURE GRANS (ABS): 0 10*3/uL (ref 0.0–0.1)
IMMATURE GRANULOCYTES: 0 %
LYMPHS: 25 %
Lymphocytes Absolute: 1.7 10*3/uL (ref 0.7–3.1)
MCH: 22.7 pg — ABNORMAL LOW (ref 26.6–33.0)
MCHC: 29.8 g/dL — ABNORMAL LOW (ref 31.5–35.7)
MCV: 76 fL — ABNORMAL LOW (ref 79–97)
MONOCYTES: 6 %
Monocytes Absolute: 0.4 10*3/uL (ref 0.1–0.9)
NEUTROS ABS: 4.6 10*3/uL (ref 1.4–7.0)
NEUTROS PCT: 65 %
Platelets: 434 10*3/uL — ABNORMAL HIGH (ref 150–379)
RBC: 4.71 x10E6/uL (ref 3.77–5.28)
RDW: 17.3 % — ABNORMAL HIGH (ref 12.3–15.4)
WBC: 7 10*3/uL (ref 3.4–10.8)

## 2016-03-02 LAB — TSH: TSH: 2.9 u[IU]/mL (ref 0.450–4.500)

## 2016-03-02 LAB — HEMOGLOBIN A1C
ESTIMATED AVERAGE GLUCOSE: 131 mg/dL
Hgb A1c MFr Bld: 6.2 % — ABNORMAL HIGH (ref 4.8–5.6)

## 2016-03-02 NOTE — Telephone Encounter (Signed)
-----   Message from Mar Daring, [place] sent at 03/02/2016 12:47 PM EST ----- HgB is stable at 10.7 (was 10.4), HgBA1c is 6.2, Cholesterol is 189, triglycerides are 217 (elevated, want 150 or less), LDL (bad) is 106 (slightly elevated-want less than 100). Triglycerides are most closely related to food and dietary habits. Continue to work on healthy lifestyle modifications as you have been doing and continue atorvastatin. All other labs are WNL and stable.

## 2016-03-02 NOTE — Telephone Encounter (Signed)
[id number] 

## 2016-03-05 LAB — HM DIABETES EYE EXAM

## 2016-03-05 NOTE — Telephone Encounter (Signed)
Patient advised as below. Patient verbalizes understanding and is in agreement with treatment plan. Patient requested lab result to be mailed. Printed and mailed. sd

## 2016-03-08 ENCOUNTER — Ambulatory Visit: Payer: Medicare Other

## 2016-03-09 ENCOUNTER — Telehealth: Payer: Self-pay

## 2016-03-09 ENCOUNTER — Ambulatory Visit
Admission: RE | Admit: 2016-03-09 | Discharge: 2016-03-09 | Disposition: A | Discharge status: Home / Self Care | Payer: Medicare Other | Source: Ambulatory Visit | Attending: Physician Assistant | Admitting: Physician Assistant

## 2016-03-09 DIAGNOSIS — Z780 Asymptomatic menopausal state: Secondary | ICD-10-CM

## 2016-03-09 DIAGNOSIS — M8589 Other specified disorders of bone density and structure, multiple sites: Secondary | ICD-10-CM

## 2016-03-09 DIAGNOSIS — Z1231 Encounter for screening mammogram for malignant neoplasm of breast: Secondary | ICD-10-CM | POA: Insufficient documentation

## 2016-03-09 DIAGNOSIS — Z1239 Encounter for other screening for malignant neoplasm of breast: Secondary | ICD-10-CM

## 2016-03-09 DIAGNOSIS — M85852 Other specified disorders of bone density and structure, left thigh: Secondary | ICD-10-CM | POA: Insufficient documentation

## 2016-03-09 NOTE — Telephone Encounter (Signed)
Left message to call back  

## 2016-03-09 NOTE — Telephone Encounter (Signed)
Advised patient as below.  

## 2016-03-09 NOTE — Telephone Encounter (Signed)
-----   Message from Mar Daring, [place] sent at 03/09/2016  3:49 PM EST ----- BMD shows a t-score of -1.7 which is still osteopenic. This is actually slightly improved from previous in 2015 where the t-score was -1.8. Continue calcium and vit D supplementation. Weight bearing exercises also help build bone health.

## 2016-03-12 ENCOUNTER — Telehealth: Payer: Self-pay

## 2016-03-12 NOTE — Telephone Encounter (Signed)
-----   Message from Mar Daring, [place] sent at 03/12/2016  8:58 AM EST ----- Normal mammogram. Repeat screening in one year.

## 2016-03-12 NOTE — Telephone Encounter (Signed)
Lmtcb. Natasha Vasquez, CMA  

## 2016-03-13 NOTE — Telephone Encounter (Signed)
Patient advised as below.  

## 2016-03-14 ENCOUNTER — Ambulatory Visit
Admission: RE | Admit: 2016-03-14 | Discharge: 2016-03-14 | Disposition: A | Discharge status: Home / Self Care | Payer: Medicare Other | Source: Ambulatory Visit | Attending: Orthopedic Surgery | Admitting: Orthopedic Surgery

## 2016-03-14 ENCOUNTER — Encounter
Admission: RE | Disposition: A | Discharge status: Home / Self Care | Payer: Self-pay | Source: Ambulatory Visit | Attending: Orthopedic Surgery

## 2016-03-14 ENCOUNTER — Encounter: Payer: Self-pay | Admitting: *Deleted

## 2016-03-14 ENCOUNTER — Ambulatory Visit: Payer: Medicare Other | Admitting: Anesthesiology

## 2016-03-14 DIAGNOSIS — E7800 Pure hypercholesterolemia, unspecified: Secondary | ICD-10-CM | POA: Insufficient documentation

## 2016-03-14 DIAGNOSIS — Z7982 Long term (current) use of aspirin: Secondary | ICD-10-CM | POA: Insufficient documentation

## 2016-03-14 DIAGNOSIS — K219 Gastro-esophageal reflux disease without esophagitis: Secondary | ICD-10-CM | POA: Insufficient documentation

## 2016-03-14 DIAGNOSIS — M23222 Derangement of posterior horn of medial meniscus due to old tear or injury, left knee: Secondary | ICD-10-CM | POA: Diagnosis not present

## 2016-03-14 DIAGNOSIS — Z79899 Other long term (current) drug therapy: Secondary | ICD-10-CM | POA: Diagnosis not present

## 2016-03-14 DIAGNOSIS — F329 Major depressive disorder, single episode, unspecified: Secondary | ICD-10-CM | POA: Insufficient documentation

## 2016-03-14 DIAGNOSIS — M94262 Chondromalacia, left knee: Secondary | ICD-10-CM | POA: Insufficient documentation

## 2016-03-14 DIAGNOSIS — M238X2 Other internal derangements of left knee: Secondary | ICD-10-CM | POA: Diagnosis present

## 2016-03-14 DIAGNOSIS — Z87891 Personal history of nicotine dependence: Secondary | ICD-10-CM | POA: Insufficient documentation

## 2016-03-14 DIAGNOSIS — E119 Type 2 diabetes mellitus without complications: Secondary | ICD-10-CM | POA: Diagnosis not present

## 2016-03-14 DIAGNOSIS — Z85820 Personal history of malignant melanoma of skin: Secondary | ICD-10-CM | POA: Insufficient documentation

## 2016-03-14 DIAGNOSIS — E785 Hyperlipidemia, unspecified: Secondary | ICD-10-CM | POA: Insufficient documentation

## 2016-03-14 DIAGNOSIS — I10 Essential (primary) hypertension: Secondary | ICD-10-CM | POA: Insufficient documentation

## 2016-03-14 HISTORY — PX: KNEE ARTHROSCOPY: SHX127

## 2016-03-14 LAB — GLUCOSE, CAPILLARY
GLUCOSE-CAPILLARY: 83 mg/dL (ref 65–99)
Glucose-Capillary: 117 mg/dL — ABNORMAL HIGH (ref 65–99)

## 2016-03-14 SURGERY — ARTHROSCOPY, KNEE
Anesthesia: General | Site: Knee | Laterality: Left | Wound class: Clean

## 2016-03-14 MED ORDER — FENTANYL CITRATE (PF) 100 MCG/2ML IJ SOLN
INTRAMUSCULAR | Status: AC
  Filled 2016-03-14: qty 2

## 2016-03-14 MED ORDER — ONDANSETRON HCL 4 MG/2ML IJ SOLN
INTRAMUSCULAR | Status: AC
  Filled 2016-03-14: qty 2

## 2016-03-14 MED ORDER — ACETAMINOPHEN 10 MG/ML IV SOLN
INTRAVENOUS | Status: DC | PRN
  Administered 2016-03-14: 1000 mg via INTRAVENOUS

## 2016-03-14 MED ORDER — MEPERIDINE HCL 50 MG/ML IJ SOLN: 6.2500 mg | INTRAMUSCULAR | Status: DC | PRN

## 2016-03-14 MED ORDER — ONDANSETRON HCL 4 MG/2ML IJ SOLN: 4.0000 mg | Freq: Four times a day (QID) | INTRAMUSCULAR | Status: DC | PRN

## 2016-03-14 MED ORDER — LIDOCAINE HCL (PF) 2 % IJ SOLN
INTRAMUSCULAR | Status: AC
  Filled 2016-03-14: qty 2

## 2016-03-14 MED ORDER — GLYCOPYRROLATE 0.2 MG/ML IJ SOLN
INTRAMUSCULAR | Status: AC
  Filled 2016-03-14: qty 1

## 2016-03-14 MED ORDER — MIDAZOLAM HCL 2 MG/2ML IJ SOLN
INTRAMUSCULAR | Status: AC
  Filled 2016-03-14: qty 2

## 2016-03-14 MED ORDER — MIDAZOLAM HCL 5 MG/5ML IJ SOLN
INTRAMUSCULAR | Status: DC | PRN
  Administered 2016-03-14: 2 mg via INTRAVENOUS

## 2016-03-14 MED ORDER — CHLORHEXIDINE GLUCONATE 4 % EX LIQD: 60.0000 mL | Freq: Once | CUTANEOUS | Status: DC

## 2016-03-14 MED ORDER — SODIUM CHLORIDE 0.9 % IV SOLN: INTRAVENOUS | Status: DC

## 2016-03-14 MED ORDER — DEXAMETHASONE SODIUM PHOSPHATE 10 MG/ML IJ SOLN
INTRAMUSCULAR | Status: AC
  Filled 2016-03-14: qty 1

## 2016-03-14 MED ORDER — PROMETHAZINE HCL 25 MG/ML IJ SOLN: 6.2500 mg | INTRAMUSCULAR | Status: DC | PRN

## 2016-03-14 MED ORDER — PROPOFOL 10 MG/ML IV BOLUS
INTRAVENOUS | Status: AC
  Filled 2016-03-14: qty 20

## 2016-03-14 MED ORDER — PROPOFOL 10 MG/ML IV BOLUS
INTRAVENOUS | Status: DC | PRN
  Administered 2016-03-14: 50 mg via INTRAVENOUS
  Administered 2016-03-14: 30 mg via INTRAVENOUS
  Administered 2016-03-14: 120 mg via INTRAVENOUS

## 2016-03-14 MED ORDER — GLYCOPYRROLATE 0.2 MG/ML IJ SOLN
INTRAMUSCULAR | Status: DC | PRN
  Administered 2016-03-14: 0.2 mg via INTRAVENOUS

## 2016-03-14 MED ORDER — HYDROCODONE-ACETAMINOPHEN 5-325 MG PO TABS: 1.0000 | ORAL_TABLET | ORAL | Status: DC | PRN

## 2016-03-14 MED ORDER — METOCLOPRAMIDE HCL 5 MG/ML IJ SOLN: 5.0000 mg | Freq: Three times a day (TID) | INTRAMUSCULAR | Status: DC | PRN

## 2016-03-14 MED ORDER — ONDANSETRON HCL 4 MG PO TABS: 4.0000 mg | ORAL_TABLET | Freq: Four times a day (QID) | ORAL | Status: DC | PRN

## 2016-03-14 MED ORDER — FENTANYL CITRATE (PF) 100 MCG/2ML IJ SOLN
INTRAMUSCULAR | Status: DC | PRN
Start: 2016-03-14 — End: 2016-03-14
  Administered 2016-03-14: 25 ug via INTRAVENOUS
  Administered 2016-03-14: 50 ug via INTRAVENOUS
  Administered 2016-03-14: 25 ug via INTRAVENOUS
  Administered 2016-03-14 (×2): 50 ug via INTRAVENOUS

## 2016-03-14 MED ORDER — SODIUM CHLORIDE 0.9 % IV SOLN
INTRAVENOUS | Status: DC
  Administered 2016-03-14: 15:00:00 via INTRAVENOUS

## 2016-03-14 MED ORDER — LIDOCAINE HCL (PF) 2 % IJ SOLN
INTRAMUSCULAR | Status: DC | PRN
  Administered 2016-03-14: 50 mg

## 2016-03-14 MED ORDER — METOCLOPRAMIDE HCL 10 MG PO TABS: 5.0000 mg | ORAL_TABLET | Freq: Three times a day (TID) | ORAL | Status: DC | PRN

## 2016-03-14 MED ORDER — DEXAMETHASONE SODIUM PHOSPHATE 10 MG/ML IJ SOLN
INTRAMUSCULAR | Status: DC | PRN
  Administered 2016-03-14: 5 mg via INTRAVENOUS

## 2016-03-14 MED ORDER — ACETAMINOPHEN 10 MG/ML IV SOLN
INTRAVENOUS | Status: AC
  Filled 2016-03-14: qty 100

## 2016-03-14 MED ORDER — PHENYLEPHRINE HCL 10 MG/ML IJ SOLN
INTRAMUSCULAR | Status: DC | PRN
  Administered 2016-03-14 (×6): 100 ug via INTRAVENOUS

## 2016-03-14 MED ORDER — HYDROCODONE-ACETAMINOPHEN 5-325 MG PO TABS: 1.0000 | ORAL_TABLET | ORAL | 0 refills | Status: DC | PRN

## 2016-03-14 MED ORDER — MORPHINE SULFATE (PF) 4 MG/ML IV SOLN
INTRAVENOUS | Status: DC | PRN
  Administered 2016-03-14: 4 mg

## 2016-03-14 MED ORDER — SEVOFLURANE IN SOLN
RESPIRATORY_TRACT | Status: AC
  Filled 2016-03-14: qty 250

## 2016-03-14 MED ORDER — FENTANYL CITRATE (PF) 100 MCG/2ML IJ SOLN: 25.0000 ug | INTRAMUSCULAR | Status: DC | PRN

## 2016-03-14 MED ORDER — EPHEDRINE SULFATE 50 MG/ML IJ SOLN
INTRAMUSCULAR | Status: DC | PRN
  Administered 2016-03-14: 10 mg via INTRAVENOUS
  Administered 2016-03-14: 5 mg via INTRAVENOUS

## 2016-03-14 MED ORDER — BUPIVACAINE-EPINEPHRINE 0.25% -1:200000 IJ SOLN
INTRAMUSCULAR | Status: DC | PRN
  Administered 2016-03-14: 5 mL
  Administered 2016-03-14: 25 mL

## 2016-03-14 MED ORDER — ONDANSETRON HCL 4 MG/2ML IJ SOLN
INTRAMUSCULAR | Status: DC | PRN
  Administered 2016-03-14: 4 mg via INTRAVENOUS

## 2016-03-14 SURGICAL SUPPLY — 23 items
BLADE SHAVER 4.5 DBL SERAT CV (CUTTER) ×3 IMPLANT
BNDG ESMARK 6X12 TAN STRL LF (GAUZE/BANDAGES/DRESSINGS) ×3 IMPLANT
CUFF TOURN 24 STER (MISCELLANEOUS) ×3 IMPLANT
CUFF TOURN 30 STER DUAL PORT (MISCELLANEOUS) IMPLANT
DRSG DERMACEA 8X12 NADH (GAUZE/BANDAGES/DRESSINGS) ×3 IMPLANT
DURAPREP 26ML APPLICATOR (WOUND CARE) ×6 IMPLANT
GAUZE SPONGE 4X4 12PLY STRL (GAUZE/BANDAGES/DRESSINGS) ×3 IMPLANT
GLOVE BIOGEL M STRL SZ7.5 (GLOVE) ×3 IMPLANT
GLOVE INDICATOR 8.0 STRL GRN (GLOVE) ×3 IMPLANT
GOWN STRL REUS W/ TWL LRG LVL3 (GOWN DISPOSABLE) ×2 IMPLANT
GOWN STRL REUS W/TWL LRG LVL3 (GOWN DISPOSABLE) ×4
IV LACTATED RINGER IRRG 3000ML (IV SOLUTION) ×12
IV LR IRRIG 3000ML ARTHROMATIC (IV SOLUTION) ×6 IMPLANT
KIT RM TURNOVER STRD PROC AR (KITS) ×3 IMPLANT
MANIFOLD NEPTUNE II (INSTRUMENTS) ×3 IMPLANT
PACK ARTHROSCOPY KNEE (MISCELLANEOUS) ×3 IMPLANT
SET TUBE SUCT SHAVER OUTFL 24K (TUBING) ×3 IMPLANT
SET TUBE TIP INTRA-ARTICULAR (MISCELLANEOUS) ×3 IMPLANT
SUT ETHILON 3-0 FS-10 30 BLK (SUTURE) ×3
SUTURE EHLN 3-0 FS-10 30 BLK (SUTURE) ×1 IMPLANT
TUBING ARTHRO INFLOW-ONLY STRL (TUBING) ×3 IMPLANT
WAND HAND CNTRL MULTIVAC 50 (MISCELLANEOUS) IMPLANT
WRAP KNEE W/COLD PACKS 25.5X14 (SOFTGOODS) ×3 IMPLANT

## 2016-03-14 NOTE — Op Note (Signed)
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   ASSISTANT: none  ANESTHESIA: general  ESTIMATED BLOOD LOSS: Minimal  FLUIDS REPLACED: 600 mL of crystalloid  TOURNIQUET TIME: Not used   DRAINS: none  IMPLANTS UTILIZED: None  INDICATIONS FOR SURGERY: [name] is a 76 y.o. year old female who has been seen for complaints of left knee pain. MRI demonstrated findings consistent with meniscal pathology. After discussion of the risks and benefits of surgical intervention, the patient expressed understanding of the risks benefits and agree with plans for left knee arthroscopy.   PROCEDURE IN DETAIL: The patient was brought into the operating room and, after adequate general anesthesia was achieved, a tourniquet was applied to the left thigh and the leg was placed in the leg holder. All bony prominences were well padded. The patient's left knee was cleaned and prepped with alcohol and Duraprep and draped in the usual sterile fashion. A "timeout" was performed as per usual protocol. The anticipated portal sites were injected with 0.25% Marcaine with epinephrine. An anterolateral incision was made and a cannula was inserted. A medium effusion was evacuated and the knee was distended with fluid using the pump. The scope was advanced down the medial gutter into the medial compartment. Under visualization with the scope, an anteromedial portal was created and a hooked probe was inserted. The medial meniscus was visualized and  probed. There was a complex degenerative tear of the posterior horn of the medial meniscus. The tear was debrided using meniscal punches and a 4.5 mm shaver. Final contouring was performed using a 50 wand. The articular cartilage was visualized. There was grade 4 chondromalacia involving the medial femoral condyle and medial tibial plateau primarily along the medial margin. The areas were debrided and contoured using the 50 wand.  The scope was then advanced into the intercondylar notch. The anterior cruciate ligament was visualized and probed and felt to be intact. The scope was removed from the lateral portal and reinserted via the anteromedial portal to better visualize the lateral compartment. The lateral compartment was tight and somewhat difficult to visualize. The lateral meniscus appeared to be intact. The articular cartilage of the lateral compartment was visualized and noted to be in good condition. Finally, the scope was advanced so as to visualize the patellofemoral articulation. Good patellar tracking was appreciated. Grade 2-3 chondromalacia was noted to the patella. The area was debrided and contoured using the 50 wand.  The knee was irrigated with copius amounts of fluid and suctioned dry. The anterolateral portal was re-approximated with #3-0 nylon. A combination of 0.25% Marcaine with epinephrine and 4 mg of Morphine were injected via the scope. The scope was removed and the anteromedial portal was re-approximated with #3-0 nylon. A sterile dressing was applied followed by application of an ice wrap.  The patient tolerated the procedure well and was transported to the PACU in stable condition.  James P. Holley Bouche., M.D.

## 2016-03-14 NOTE — Anesthesia Preprocedure Evaluation (Signed)
Anesthesia Evaluation  Patient identified by MRN, date of birth, ID band Patient awake    Reviewed: Allergy & Precautions, NPO status , Patient's Chart, lab work & pertinent test results  History of Anesthesia Complications Negative for: history of anesthetic complications  Airway Mallampati: II  TM Distance: >3 FB Neck ROM: Full    Dental  (+) Caps   Pulmonary neg sleep apnea, neg COPD, former smoker,    breath sounds clear to auscultation- rhonchi (-) wheezing      Cardiovascular hypertension, Pt. on medications (-) CAD and (-) Past MI  Rhythm:Regular Rate:Normal - Systolic murmurs and - Diastolic murmurs    Neuro/Psych PSYCHIATRIC DISORDERS Depression negative neurological ROS     GI/Hepatic Neg liver ROS, GERD  ,  Endo/Other  diabetes (diet controlled)  Renal/GU negative Renal ROS     Musculoskeletal  (+) Arthritis ,   Abdominal (+) - obese,   Peds  Hematology  (+) anemia ,   Anesthesia Other Findings Past Medical History: No date: Anemia in pregnancy No date: Arthritis     Comment: hands No date: Barrett esophagus No date: Cancer ([place])     Comment: melanoma No date: Depression No date: Depression No date: Diabetes mellitus without complication (HCC)     Comment: diet controlled No date: Family history of adverse reaction to anesthes*     Comment: son PONV No date: GERD (gastroesophageal reflux disease) No date: Hypercholesteremia No date: Hyperlipidemia No date: Hypertension No date: MVP (mitral valve prolapse)     Comment: followed by PCP No date: Nephritis No date: Nephritis No date: S/P appendectomy No date: Torn meniscus     Comment: left   Reproductive/Obstetrics                             Anesthesia Physical Anesthesia Plan  ASA: III  Anesthesia Plan: General   Post-op Pain Management:    Induction: Intravenous  Airway Management Planned:  LMA  Additional Equipment:   Intra-op Plan:   Post-operative Plan:   Informed Consent: I have reviewed the patients History and Physical, chart, labs and discussed the procedure including the risks, benefits and alternatives for the proposed anesthesia with the patient or authorized representative who has indicated his/her understanding and acceptance.   Dental advisory given  Plan Discussed with: CRNA and Anesthesiologist  Anesthesia Plan Comments:         Anesthesia Quick Evaluation

## 2016-03-14 NOTE — H&P (Signed)
The patient has been re-examined, and the chart reviewed, and there have been no interval changes to the documented history and physical.    The risks, benefits, and alternatives have been discussed at length. The patient expressed understanding of the risks benefits and agreed with plans for surgical intervention.  Najee Manninen P. Chenoah Mcnally, Jr. M.D.    

## 2016-03-14 NOTE — Discharge Instructions (Signed)
°  Instructions after Knee Arthroscopy    Natasha Vasquez P. Holley Bouche., M.D.     Dept. of [facility]  [place], [place]  [address]   Phone: [phone]   Fax: [phone]   DIET:  Drink plenty of non-alcoholic fluids & begin a light diet.  Resume your normal diet the day after surgery.  ACTIVITY:   You may use crutches or a walker with weight-bearing as tolerated, unless instructed otherwise.  You may wean yourself off of the walker or crutches as tolerated.   Begin doing gentle exercises. Exercising will reduce the pain and swelling, increase motion, and prevent muscle weakness.    Avoid strenuous activities or athletics for a minimum of 4-6 weeks after arthroscopic surgery.  Do not drive or operate any equipment until instructed.  WOUND CARE:   Place one to two pillows under the knee the first day or two when sitting or lying.   Continue to use the ice packs periodically to reduce pain and swelling.  The small incisions in your knee are closed with nylon stitches. The stitches will be removed in the office.  The bulky dressing may be removed on the second day after surgery. DO NOT TOUCH THE STITCHES. Put a Band-Aid over each stitch. Do NOT use any ointments or creams on the incisions.   You may bathe or shower after the stitches are removed at the first office visit following surgery.  MEDICATIONS:  You may resume your regular medications.  Please take the pain medication as prescribed.  Do not take pain medication on an empty stomach.  Do not drive or drink alcoholic beverages when taking pain medications.  CALL THE OFFICE FOR:  Temperature above 101 degrees  Excessive bleeding or drainage on the dressing.  Excessive swelling, coldness, or paleness of the toes.  Persistent nausea and vomiting.  FOLLOW-UP:   You should have an appointment to return to the office in 7-10 days after surgery.      AMBULATORY SURGERY  DISCHARGE INSTRUCTIONS   1) The drugs that you were given will stay in your system until tomorrow so for the next 24 hours you should not:  A) Drive an automobile B) Make any legal decisions C) Drink any alcoholic beverage   2) You may resume regular meals tomorrow.  Today it is better to start with liquids and gradually work up to solid foods.  You may eat anything you prefer, but it is better to start with liquids, then soup and crackers, and gradually work up to solid foods.   3) Please notify your doctor immediately if you have any unusual bleeding, trouble breathing, redness and pain at the surgery site, drainage, fever, or pain not relieved by medication. 4)   5) Your post-operative visit with Dr.                                     is: Date:                        Time:    Please call to schedule your post-operative visit.  6) Additional Instructions:

## 2016-03-14 NOTE — Anesthesia Procedure Notes (Signed)
Procedure Name: LMA Insertion Performed by: Briarrose Shor Pre-anesthesia Checklist: Patient identified, Patient being monitored, Timeout performed, Emergency Drugs available and Suction available Patient Re-evaluated:Patient Re-evaluated prior to inductionOxygen Delivery Method: Circle system utilized Preoxygenation: Pre-oxygenation with 100% oxygen Intubation Type: IV induction Ventilation: Mask ventilation without difficulty LMA: LMA inserted LMA Size: 3.5 Tube type: Oral Number of attempts: 1 Placement Confirmation: positive ETCO2 and breath sounds checked- equal and bilateral Tube secured with: Tape Dental Injury: Teeth and Oropharynx as per pre-operative assessment        

## 2016-03-14 NOTE — Transfer of Care (Signed)
Immediate Anesthesia Transfer of Care Note  Patient: Natasha Vasquez  Procedure(s) Performed: Procedure(s): ARTHROSCOPY KNEE, PARTIAL MEDIAL MENISECTOMY, CHONDROPLASTY (Left)  Patient Location: PACU  Anesthesia Type:General  Level of Consciousness: sedated  Airway & Oxygen Therapy: Patient Spontanous Breathing and Patient connected to face mask oxygen  Post-op Assessment: Report given to RN and Post -op Vital signs reviewed and stable  Post vital signs: Reviewed  Last Vitals:  Vitals:   03/14/16 1442 03/14/16 1806  BP: 115/90 (!) 98/54  Pulse: 84 76  Resp: 18 14  Temp: 36.3 C     Last Pain:  Vitals:   03/14/16 1442  TempSrc: Temporal         Complications: No apparent anesthesia complications

## 2016-03-14 NOTE — Anesthesia Post-op Follow-up Note (Cosign Needed)
Anesthesia QCDR form completed.        

## 2016-03-14 NOTE — Anesthesia Postprocedure Evaluation (Signed)
Anesthesia Post Note  Patient: YAZLIN GOODSTEIN  Procedure(s) Performed: Procedure(s) (LRB): ARTHROSCOPY KNEE, PARTIAL MEDIAL MENISECTOMY, CHONDROPLASTY (Left)  Patient location during evaluation: PACU Anesthesia Type: General Level of consciousness: awake and alert Pain management: pain level controlled Vital Signs Assessment: post-procedure vital signs reviewed and stable Respiratory status: spontaneous breathing, nonlabored ventilation, respiratory function stable and patient connected to nasal cannula oxygen Cardiovascular status: blood pressure returned to baseline and stable Postop Assessment: no signs of nausea or vomiting Anesthetic complications: no     Last Vitals:  Vitals:   03/14/16 1854 03/14/16 1907  BP: 115/63 121/68  Pulse: 89 85  Resp: 18 16  Temp:  36.8 C    Last Pain:  Vitals:   03/14/16 1907  TempSrc: Temporal  PainSc: 0-No pain                 Molli Barrows

## 2016-03-15 ENCOUNTER — Encounter: Payer: Self-pay | Admitting: Orthopedic Surgery

## 2016-06-22 ENCOUNTER — Other Ambulatory Visit: Payer: Self-pay | Admitting: Physician Assistant

## 2016-06-22 NOTE — Telephone Encounter (Signed)
Had wellness with you 01/24/2016. Depression was stable at that time. Renaldo Fiddler, CMA

## 2016-07-20 ENCOUNTER — Other Ambulatory Visit: Payer: Self-pay | Admitting: Physician Assistant

## 2016-07-20 DIAGNOSIS — F4322 Adjustment disorder with anxiety: Secondary | ICD-10-CM

## 2016-07-27 ENCOUNTER — Telehealth: Payer: Self-pay | Admitting: Physician Assistant

## 2016-07-27 NOTE — Telephone Encounter (Signed)
ROI signed by patient, records gave to patient 06/28/16

## 2016-08-04 ENCOUNTER — Other Ambulatory Visit: Payer: Self-pay | Admitting: Physician Assistant

## 2016-08-04 DIAGNOSIS — I10 Essential (primary) hypertension: Secondary | ICD-10-CM

## 2016-08-18 ENCOUNTER — Other Ambulatory Visit: Payer: Self-pay | Admitting: Physician Assistant

## 2016-08-18 DIAGNOSIS — N3281 Overactive bladder: Secondary | ICD-10-CM

## 2016-09-01 ENCOUNTER — Other Ambulatory Visit: Payer: Self-pay | Admitting: Physician Assistant

## 2016-09-01 DIAGNOSIS — K219 Gastro-esophageal reflux disease without esophagitis: Secondary | ICD-10-CM

## 2016-10-11 ENCOUNTER — Other Ambulatory Visit: Payer: Self-pay | Admitting: Physician Assistant

## 2016-10-11 DIAGNOSIS — E7800 Pure hypercholesterolemia, unspecified: Secondary | ICD-10-CM

## 2016-12-21 ENCOUNTER — Other Ambulatory Visit: Payer: Self-pay | Admitting: Physician Assistant

## 2017-01-15 HISTORY — PX: REDUCTION MAMMAPLASTY: SUR839

## 2017-01-15 HISTORY — PX: MASTECTOMY: SHX3

## 2017-01-29 ENCOUNTER — Other Ambulatory Visit: Payer: Self-pay | Admitting: Physician Assistant

## 2017-01-29 DIAGNOSIS — I10 Essential (primary) hypertension: Secondary | ICD-10-CM

## 2017-01-29 MED ORDER — LISINOPRIL 5 MG PO TABS: 5.0000 mg | ORAL_TABLET | Freq: Every day | ORAL | 1 refills | Status: DC

## 2017-01-29 NOTE — Telephone Encounter (Signed)
[facility] faxed a refill request for a 90-days supply for the following medication. Thanks CC  lisinopril (PRINIVIL,ZESTRIL) 5 MG tablet

## 2017-02-12 ENCOUNTER — Other Ambulatory Visit: Payer: Self-pay | Admitting: Physician Assistant

## 2017-02-12 DIAGNOSIS — N3281 Overactive bladder: Secondary | ICD-10-CM

## 2017-02-15 ENCOUNTER — Other Ambulatory Visit: Payer: Self-pay | Admitting: Physician Assistant

## 2017-02-15 DIAGNOSIS — F4322 Adjustment disorder with anxiety: Secondary | ICD-10-CM

## 2017-03-04 ENCOUNTER — Other Ambulatory Visit: Payer: Self-pay | Admitting: Physician Assistant

## 2017-03-04 DIAGNOSIS — K219 Gastro-esophageal reflux disease without esophagitis: Secondary | ICD-10-CM

## 2017-03-11 ENCOUNTER — Ambulatory Visit (INDEPENDENT_AMBULATORY_CARE_PROVIDER_SITE_OTHER): Payer: Medicare Other

## 2017-03-11 ENCOUNTER — Ambulatory Visit (INDEPENDENT_AMBULATORY_CARE_PROVIDER_SITE_OTHER): Payer: Medicare Other | Admitting: Family Medicine

## 2017-03-11 ENCOUNTER — Encounter: Payer: Self-pay | Admitting: Family Medicine

## 2017-03-11 VITALS — BP 124/90 | HR 80 | Temp 99.0°F | Ht 66.0 in | Wt 181.4 lb

## 2017-03-11 VITALS — BP 128/90 | HR 80 | Temp 99.0°F | Resp 16 | Ht 66.0 in | Wt 181.0 lb

## 2017-03-11 DIAGNOSIS — Z0001 Encounter for general adult medical examination with abnormal findings: Secondary | ICD-10-CM | POA: Diagnosis not present

## 2017-03-11 DIAGNOSIS — E039 Hypothyroidism, unspecified: Secondary | ICD-10-CM

## 2017-03-11 DIAGNOSIS — R1310 Dysphagia, unspecified: Secondary | ICD-10-CM | POA: Diagnosis not present

## 2017-03-11 DIAGNOSIS — R252 Cramp and spasm: Secondary | ICD-10-CM | POA: Diagnosis not present

## 2017-03-11 DIAGNOSIS — R1319 Other dysphagia: Secondary | ICD-10-CM

## 2017-03-11 DIAGNOSIS — K2100 Gastro-esophageal reflux disease with esophagitis, without bleeding: Secondary | ICD-10-CM

## 2017-03-11 DIAGNOSIS — E663 Overweight: Secondary | ICD-10-CM | POA: Diagnosis not present

## 2017-03-11 DIAGNOSIS — E7800 Pure hypercholesterolemia, unspecified: Secondary | ICD-10-CM | POA: Diagnosis not present

## 2017-03-11 DIAGNOSIS — I10 Essential (primary) hypertension: Secondary | ICD-10-CM | POA: Diagnosis not present

## 2017-03-11 DIAGNOSIS — E119 Type 2 diabetes mellitus without complications: Secondary | ICD-10-CM | POA: Diagnosis not present

## 2017-03-11 DIAGNOSIS — Z1231 Encounter for screening mammogram for malignant neoplasm of breast: Secondary | ICD-10-CM | POA: Diagnosis not present

## 2017-03-11 DIAGNOSIS — F331 Major depressive disorder, recurrent, moderate: Secondary | ICD-10-CM

## 2017-03-11 DIAGNOSIS — E669 Obesity, unspecified: Secondary | ICD-10-CM | POA: Insufficient documentation

## 2017-03-11 DIAGNOSIS — Z0000 Encounter for general adult medical examination without abnormal findings: Secondary | ICD-10-CM

## 2017-03-11 DIAGNOSIS — E038 Other specified hypothyroidism: Secondary | ICD-10-CM

## 2017-03-11 LAB — POCT GLYCOSYLATED HEMOGLOBIN (HGB A1C)
ESTIMATED AVERAGE GLUCOSE: 131
Hemoglobin A1C: 6.2

## 2017-03-11 MED ORDER — VENLAFAXINE HCL ER 225 MG PO TB24: 225.0000 mg | ORAL_TABLET | Freq: Every day | ORAL | 2 refills | Status: DC

## 2017-03-11 NOTE — Patient Instructions (Signed)
Preventive Care 77 Years and Older, Female Preventive care refers to lifestyle choices and visits with your health care provider that can promote health and wellness. What does preventive care include?  A yearly physical exam. This is also called an annual well check.  Dental exams once or twice a year.  Routine eye exams. Ask your health care provider how often you should have your eyes checked.  Personal lifestyle choices, including: ? Daily care of your teeth and gums. ? Regular physical activity. ? Eating a healthy diet. ? Avoiding tobacco and drug use. ? Limiting alcohol use. ? Practicing safe sex. ? Taking low-dose aspirin every day. ? Taking vitamin and mineral supplements as recommended by your health care provider. What happens during an annual well check? The services and screenings done by your health care provider during your annual well check will depend on your age, overall health, lifestyle risk factors, and family history of disease. Counseling Your health care provider may ask you questions about your:  Alcohol use.  Tobacco use.  Drug use.  Emotional well-being.  Home and relationship well-being.  Sexual activity.  Eating habits.  History of falls.  Memory and ability to understand (cognition).  Work and work environment.  Reproductive health.  Screening You may have the following tests or measurements:  Height, weight, and BMI.  Blood pressure.  Lipid and cholesterol levels. These may be checked every 5 years, or more frequently if you are over 50 years old.  Skin check.  Lung cancer screening. You may have this screening every year starting at age 55 if you have a 30-pack-year history of smoking and currently smoke or have quit within the past 15 years.  Fecal occult blood test (FOBT) of the stool. You may have this test every year starting at age 50.  Flexible sigmoidoscopy or colonoscopy. You may have a sigmoidoscopy every 5 years or  a colonoscopy every 10 years starting at age 50.  Hepatitis C blood test.  Hepatitis B blood test.  Sexually transmitted disease (STD) testing.  Diabetes screening. This is done by checking your blood sugar (glucose) after you have not eaten for a while (fasting). You may have this done every 1-3 years.  Bone density scan. This is done to screen for osteoporosis. You may have this done starting at age 77.  Mammogram. This may be done every 1-2 years. Talk to your health care provider about how often you should have regular mammograms.  Talk with your health care provider about your test results, treatment options, and if necessary, the need for more tests. Vaccines Your health care provider may recommend certain vaccines, such as:  Influenza vaccine. This is recommended every year.  Tetanus, diphtheria, and acellular pertussis (Tdap, Td) vaccine. You may need a Td booster every 10 years.  Varicella vaccine. You may need this if you have not been vaccinated.  Zoster vaccine. You may need this after age 77.  Measles, mumps, and rubella (MMR) vaccine. You may need at least one dose of MMR if you were born in 1957 or later. You may also need a second dose.  Pneumococcal 13-valent conjugate (PCV13) vaccine. One dose is recommended after age 77.  Pneumococcal polysaccharide (PPSV23) vaccine. One dose is recommended after age 77.  Meningococcal vaccine. You may need this if you have certain conditions.  Hepatitis A vaccine. You may need this if you have certain conditions or if you travel or work in places where you may be exposed to hepatitis   A.  Hepatitis B vaccine. You may need this if you have certain conditions or if you travel or work in places where you may be exposed to hepatitis B.  Haemophilus influenzae type b (Hib) vaccine. You may need this if you have certain conditions.  Talk to your health care provider about which screenings and vaccines you need and how often you  need them. This information is not intended to replace advice given to you by your health care provider. Make sure you discuss any questions you have with your health care provider. Document Released: 01/28/2015 Document Revised: 09/21/2015 Document Reviewed: 11/02/2014 Elsevier Interactive Patient Education  2018 [employer]  

## 2017-03-11 NOTE — Assessment & Plan Note (Signed)
Doing well on Atorvastatin - continue Recheck lipid panel and CMP

## 2017-03-11 NOTE — Addendum Note (Signed)
Addended by: Brennan Bailey on: 03/11/2017 02:04 PM   Modules accepted: Orders

## 2017-03-11 NOTE — Assessment & Plan Note (Signed)
Discussed healthy weight management, diet and exercise

## 2017-03-11 NOTE — Assessment & Plan Note (Signed)
Uncontrolled Worsening stress recently Not entirely reflected in PHQ9 as patient is embarrassed to discuss or admit that she is depressed Will continue bailify 2mg  daily - counld increase in the future if needed Increase Effexor to 225mg  daily Advised on importance of counseling/therapy

## 2017-03-11 NOTE — Patient Instructions (Addendum)
[name] , Thank you for taking time to come for your Medicare Wellness Visit. I appreciate your ongoing commitment to your health goals. Please review the following plan we discussed and let me know if I can assist you in the future.   Screening recommendations/referrals: Colonoscopy: Up to date Mammogram: Up to date Bone Density: Up to date Recommended yearly ophthalmology/optometry visit for glaucoma screening and checkup Recommended yearly dental visit for hygiene and checkup  Vaccinations: Influenza vaccine: Up to date Pneumococcal vaccine: Up to date Tdap vaccine: Up to date Shingles vaccine: Up to date per pt     Advanced directives: Please bring a copy of your POA (Power of [place]) and/or Living Will to your next appointment.   Conditions/risks identified: Recommend to increase water intake to 4-6 glasses a day.   Next appointment: 10:00 AM today   Preventive Care 77 Years and Older, Female Preventive care refers to lifestyle choices and visits with your health care provider that can promote health and wellness. What does preventive care include?  A yearly physical exam. This is also called an annual well check.  Dental exams once or twice a year.  Routine eye exams. Ask your health care provider how often you should have your eyes checked.  Personal lifestyle choices, including:  Daily care of your teeth and gums.  Regular physical activity.  Eating a healthy diet.  Avoiding tobacco and drug use.  Limiting alcohol use.  Practicing safe sex.  Taking low-dose aspirin every day.  Taking vitamin and mineral supplements as recommended by your health care provider. What happens during an annual well check? The services and screenings done by your health care provider during your annual well check will depend on your age, overall health, lifestyle risk factors, and family history of disease. Counseling  Your health care provider may ask you questions about  your:  Alcohol use.  Tobacco use.  Drug use.  Emotional well-being.  Home and relationship well-being.  Sexual activity.  Eating habits.  History of falls.  Memory and ability to understand (cognition).  Work and work Statistician.  Reproductive health. Screening  You may have the following tests or measurements:  Height, weight, and BMI.  Blood pressure.  Lipid and cholesterol levels. These may be checked every 5 years, or more frequently if you are over 29 years old.  Skin check.  Lung cancer screening. You may have this screening every year starting at age 30 if you have a 30-pack-year history of smoking and currently smoke or have quit within the past 15 years.  Fecal occult blood test (FOBT) of the stool. You may have this test every year starting at age 4.  Flexible sigmoidoscopy or colonoscopy. You may have a sigmoidoscopy every 5 years or a colonoscopy every 10 years starting at age 3.  Hepatitis C blood test.  Hepatitis B blood test.  Sexually transmitted disease (STD) testing.  Diabetes screening. This is done by checking your blood sugar (glucose) after you have not eaten for a while (fasting). You may have this done every 1-3 years.  Bone density scan. This is done to screen for osteoporosis. You may have this done starting at age 38.  Mammogram. This may be done every 1-2 years. Talk to your health care provider about how often you should have regular mammograms. Talk with your health care provider about your test results, treatment options, and if necessary, the need for more tests. Vaccines  Your health care provider may recommend certain  vaccines, such as:  Influenza vaccine. This is recommended every year.  Tetanus, diphtheria, and acellular pertussis (Tdap, Td) vaccine. You may need a Td booster every 10 years.  Zoster vaccine. You may need this after age 31.  Pneumococcal 13-valent conjugate (PCV13) vaccine. One dose is recommended  after age 42.  Pneumococcal polysaccharide (PPSV23) vaccine. One dose is recommended after age 57. Talk to your health care provider about which screenings and vaccines you need and how often you need them. This information is not intended to replace advice given to you by your health care provider. Make sure you discuss any questions you have with your health care provider. Document Released: 01/28/2015 Document Revised: 09/21/2015 Document Reviewed: 11/02/2014 Elsevier Interactive Patient Education  2017 [place] Prevention in the Home Falls can cause injuries. They can happen to people of all ages. There are many things you can do to make your home safe and to help prevent falls. What can I do on the outside of my home?  Regularly fix the edges of walkways and driveways and fix any cracks.  Remove anything that might make you trip as you walk through a door, such as a raised step or threshold.  Trim any bushes or trees on the path to your home.  Use bright outdoor lighting.  Clear any walking paths of anything that might make someone trip, such as rocks or tools.  Regularly check to see if handrails are loose or broken. Make sure that both sides of any steps have handrails.  Any raised decks and porches should have guardrails on the edges.  Have any leaves, snow, or ice cleared regularly.  Use sand or salt on walking paths during winter.  Clean up any spills in your garage right away. This includes oil or grease spills. What can I do in the bathroom?  Use night lights.  Install grab bars by the toilet and in the tub and shower. Do not use towel bars as grab bars.  Use non-skid mats or decals in the tub or shower.  If you need to sit down in the shower, use a plastic, non-slip stool.  Keep the floor dry. Clean up any water that spills on the floor as soon as it happens.  Remove soap buildup in the tub or shower regularly.  Attach bath mats securely with  double-sided non-slip rug tape.  Do not have throw rugs and other things on the floor that can make you trip. What can I do in the bedroom?  Use night lights.  Make sure that you have a light by your bed that is easy to reach.  Do not use any sheets or blankets that are too big for your bed. They should not hang down onto the floor.  Have a firm chair that has side arms. You can use this for support while you get dressed.  Do not have throw rugs and other things on the floor that can make you trip. What can I do in the kitchen?  Clean up any spills right away.  Avoid walking on wet floors.  Keep items that you use a lot in easy-to-reach places.  If you need to reach something above you, use a strong step stool that has a grab bar.  Keep electrical cords out of the way.  Do not use floor polish or wax that makes floors slippery. If you must use wax, use non-skid floor wax.  Do not have throw rugs and other things  on the floor that can make you trip. What can I do with my stairs?  Do not leave any items on the stairs.  Make sure that there are handrails on both sides of the stairs and use them. Fix handrails that are broken or loose. Make sure that handrails are as long as the stairways.  Check any carpeting to make sure that it is firmly attached to the stairs. Fix any carpet that is loose or worn.  Avoid having throw rugs at the top or bottom of the stairs. If you do have throw rugs, attach them to the floor with carpet tape.  Make sure that you have a light switch at the top of the stairs and the bottom of the stairs. If you do not have them, ask someone to add them for you. What else can I do to help prevent falls?  Wear shoes that:  Do not have high heels.  Have rubber bottoms.  Are comfortable and fit you well.  Are closed at the toe. Do not wear sandals.  If you use a stepladder:  Make sure that it is fully opened. Do not climb a closed stepladder.  Make  sure that both sides of the stepladder are locked into place.  Ask someone to hold it for you, if possible.  Clearly mark and make sure that you can see:  Any grab bars or handrails.  First and last steps.  Where the edge of each step is.  Use tools that help you move around (mobility aids) if they are needed. These include:  Canes.  Walkers.  Scooters.  Crutches.  Turn on the lights when you go into a dark area. Replace any light bulbs as soon as they burn out.  Set up your furniture so you have a clear path. Avoid moving your furniture around.  If any of your floors are uneven, fix them.  If there are any pets around you, be aware of where they are.  Review your medicines with your doctor. Some medicines can make you feel dizzy. This can increase your chance of falling. Ask your doctor what other things that you can do to help prevent falls. This information is not intended to replace advice given to you by your health care provider. Make sure you discuss any questions you have with your health care provider. Document Released: 10/28/2008 Document Revised: 06/09/2015 Document Reviewed: 02/05/2014 Elsevier Interactive Patient Education  2017 [employer].

## 2017-03-11 NOTE — Assessment & Plan Note (Signed)
Fairly well controlled Diastolic BP is borderline Continue lisinopril 5 mg daily Recheck CMP F/u in 6 months

## 2017-03-11 NOTE — Assessment & Plan Note (Signed)
New and worsening Referral to GI Seems to be related to GERD as above

## 2017-03-11 NOTE — Assessment & Plan Note (Signed)
Worsening with new dysphagia Referral back to GI Continue PPI and H2 blocker Continue Tums prn

## 2017-03-11 NOTE — Progress Notes (Signed)
Patient: Natasha Vasquez, Female    DOB: [birth year], 77 y.o.   MRN: [id number] Visit Date: 03/11/2017  Today's Provider: Lavon Paganini, MD   I, Martha Clan, CMA, am acting as scribe for Lavon Paganini, MD.  Chief Complaint  Patient presents with  . Annual Exam   Subjective:    Annual physical exam Natasha Vasquez is a 77 y.o. female who presents today for health maintenance and complete physical. She feels well. She reports exercising none since moving to a new house. She reports she is sleeping fairly well, but is c/o nocturia, as well as nocturnal leg cramps.  Pt is c/o GERD. States she is experiencing choking and cough and dysphagia. Does have occasional regurgitation when food seems to get stuck.  This has been worsening.  She is taking Tums, Pepcid, Protonix for this, with some relief of sx. She states she still notices a cough with these medications.   Though she is >[address], Dr Vira Agar told her that her next colonoscopy would be in 2022 if physically able. Her mother had colon cancer at age 58 requiring hemicolectomy. Due for mammogram - last BIRADS 1 in 03/09/2016. Thinks she had eye exam in 12/2016 - will need to request records ----------------------------------------------------------------- T2DM - Checking BG at home: no - Medications: none - Compliance: n/a - Diet: low carb - foot exam: needs today - microalbumin: n/a on ACEi - denies symptoms of hypoglycemia, polyuria, polydipsia, numbness extremities, foot ulcers/trauma   HTN: - Medications: lisinopril 5mg  daily - Compliance: good - Checking BP at home: no - Denies any SOB, CP, vision changes, LE edema, medication SEs, or symptoms of hypotension  HLD - medications: Atorvastatin 20mg  daily - compliance: good - medication SEs: none  Leg cramps: Worse at night.  Can walk it off during the day.  At night will have to apply heating pad after they awaken her from sleep.  Having a hard time  adjusting to new lifestyle and getting rid of things since moving to [facility] within the last month.  Thinks that stress level contributes to worsening GERD symptoms.  Did see Psychology after the death of her mother.States that her depression is much better than it used to be (she used to have SI), but it is worsening some with recent move and increased stress.  Feels like the biggest problem is that she doesn't like herself.  She puts on a happy face for others, but feels phony.  Review of Systems  Constitutional: Negative.   HENT: Negative.   Eyes: Negative.   Respiratory: Positive for cough and choking. Negative for apnea, chest tightness, shortness of breath, wheezing and stridor.   Cardiovascular: Negative.   Gastrointestinal: Negative.   Endocrine: Negative.   Genitourinary: Negative.   Musculoskeletal: Negative.   Skin: Negative.   Allergic/Immunologic: Negative.   Neurological: Negative.   Hematological: Negative.   Psychiatric/Behavioral: Negative.     Social History      She  reports that she has quit smoking. Her smoking use included cigarettes. she has never used smokeless tobacco. She reports that she drinks about 4.2 - 8.4 oz of alcohol per week. She reports that she does not use drugs.       Social History   Socioeconomic History  . Marital status: Married    Spouse name: Sonia Side  . Number of children: 2  . Years of education: None  . Highest education level: Master's degree (e.g., MA, MS, MEng, MEd,  MSW, MBA)  Social Needs  . Financial resource strain: Not hard at all  . Food insecurity - worry: Never true  . Food insecurity - inability: Never true  . Transportation needs - medical: No  . Transportation needs - non-medical: No  Occupational History  . Occupation: retired  Tobacco Use  . Smoking status: Former Smoker    Types: Cigarettes  . Smokeless tobacco: Never Used  . Tobacco comment: smoked in college  Substance and Sexual Activity  . Alcohol use: Yes     Alcohol/week: 4.2 - 8.4 oz    Types: 7 - 14 Glasses of wine per week    Comment: 1-2 glasses of wine per night  . Drug use: No  . Sexual activity: No  Other Topics Concern  . None  Social History Narrative  . None    Past Medical History:  Diagnosis Date  . Anemia in pregnancy   . Arthritis    hands  . Barrett esophagus   . Cancer ([place])    melanoma  . Depression   . Depression   . Diabetes mellitus without complication (HCC)    diet controlled  . Family history of adverse reaction to anesthesia    son PONV  . GERD (gastroesophageal reflux disease)   . Hypercholesteremia   . Hyperlipidemia   . Hypertension   . MVP (mitral valve prolapse)    followed by PCP  . Nephritis   . Nephritis   . S/P appendectomy   . Torn meniscus    left     Patient Active Problem List   Diagnosis Date Noted  . Dysphagia 03/11/2017  . Overweight 03/11/2017  . Subclinical hypothyroidism 06/08/2015  . Osteopenia 06/08/2015  . Billowing mitral valve 06/08/2015  . Melanoma in situ ([place]) 06/08/2015  . Cramps of lower extremity 06/08/2015  . Arthritis 06/08/2015  . Absolute anemia 06/08/2015  . Type 2 diabetes mellitus ([place]) 05/18/2015  . Closed fracture of neck of right femur ([place]) 03/25/2015  . Clinical depression 01/26/2015  . Adjustment reaction 11/22/2014  . Hypercholesterolemia 10/28/2014  . Essential hypertension 09/27/2014  . GERD (gastroesophageal reflux disease) 08/06/2014  . Urinary frequency 07/16/2014  . Bell palsy 04/24/2004    Past Surgical History:  Procedure Laterality Date  . ABDOMINAL HYSTERECTOMY    . APPENDECTOMY    . BREAST CYST ASPIRATION Bilateral    NEG  . BREAST EXCISIONAL BIOPSY Left 20+ yrs ago   NEG  . BREAST LUMPECTOMY Left    benign  . BROW LIFT Bilateral 02/01/2015   Procedure: BLEPHAROPLASTY bilateral upper eyelids.;  Surgeon: Karle Starch, MD;  Location: [facility];  Service: Ophthalmology;  Laterality: Bilateral;  DIABETIC - diet  controlled  . CHOLECYSTECTOMY    . COLONOSCOPY    . COLONOSCOPY WITH PROPOFOL N/A 05/11/2015   Procedure: COLONOSCOPY WITH PROPOFOL;  Surgeon: Manya Silvas, MD;  Location: [facility] ENDOSCOPY;  Service: Endoscopy;  Laterality: N/A;  . ESOPHAGOGASTRODUODENOSCOPY    . ESOPHAGOGASTRODUODENOSCOPY (EGD) WITH PROPOFOL  05/11/2015   Procedure: ESOPHAGOGASTRODUODENOSCOPY (EGD) WITH PROPOFOL;  Surgeon: Manya Silvas, MD;  Location: [facility] ENDOSCOPY;  Service: Endoscopy;;  . JOINT REPLACEMENT    . KNEE ARTHROSCOPY Left 03/14/2016   Procedure: ARTHROSCOPY KNEE, PARTIAL MEDIAL MENISECTOMY, CHONDROPLASTY;  Surgeon: Dereck Leep, MD;  Location: [facility] ORS;  Service: Orthopedics;  Laterality: Left;  . PARTIAL HIP ARTHROPLASTY Right   . TONSILLECTOMY      Family History        Family  Status  Relation Name Status  . Mother  Deceased  . Father  Deceased  . Brother  Deceased  . Son  Alive  . Son  Alive  . Neg Hx  (Not Specified)        Her family history includes Atrial fibrillation in her father; Cancer in her brother and mother; Colon cancer in her mother; Diabetes in her father; Healthy in her son and son; Heart disease in her father; Hypertension in her father. There is no history of Breast cancer.      Allergies  Allergen Reactions  . Aleve [Naproxen Sodium]   . Codeine Other (See Comments)    Chest pain  . Opium Other (See Comments)    Chest pain  . Oxycodone Other (See Comments)    MENTAL CHANGES  . Penicillins     Has patient had a PCN reaction causing immediate rash, facial/tongue/throat swelling, SOB or lightheadedness with hypotension: Yes Has patient had a PCN reaction causing severe rash involving mucus membranes or skin necrosis: No Has patient had a PCN reaction that required hospitalization: NO Has patient had a PCN reaction occurring within the last 10 years: No If all of the above answers are "NO", then may proceed with Cephalosporin use.  Can take Augmentin  . Naproxen  Hives and Rash    And headaches     Current Outpatient Medications:  .  acetaminophen (TYLENOL) 500 MG tablet, Take 1,000 mg by mouth every 6 (six) hours as needed for mild pain. , Disp: , Rfl:  .  ARIPiprazole (ABILIFY) 2 MG tablet, TAKE 1 TABLET (2 MG TOTAL) BY MOUTH EVERY EVENING., Disp: 90 tablet, Rfl: 1 .  aspirin 81 MG tablet, Take 81 mg by mouth daily., Disp: , Rfl:  .  atorvastatin (LIPITOR) 20 MG tablet, TAKE 1 TABLET (20 MG TOTAL) BY MOUTH DAILY., Disp: 90 tablet, Rfl: 1 .  bismuth subsalicylate (PEPTO BISMOL) 262 MG chewable tablet, Chew 524 mg by mouth as needed., Disp: , Rfl:  .  calcium carbonate (TUMS SMOOTHIES) 750 MG chewable tablet, Chew 1 tablet by mouth 3 (three) times daily as needed for heartburn., Disp: , Rfl:  .  clindamycin (CLEOCIN) 150 MG capsule, Take 600 mg by mouth once. PRIOR TO DENTAL APPOINTMENT, Disp: , Rfl:  .  famotidine (PEPCID) 20 MG tablet, Take 20 mg by mouth daily as needed for heartburn or indigestion., Disp: , Rfl:  .  lisinopril (PRINIVIL,ZESTRIL) 5 MG tablet, Take 1 tablet (5 mg total) by mouth daily., Disp: 90 tablet, Rfl: 1 .  Multiple Vitamins-Minerals ([employer] SPECTRAVITE PO), Take 1 tablet by mouth daily. Centrum silver, Disp: , Rfl:  .  oxybutynin (DITROPAN-XL) 10 MG 24 hr tablet, TAKE 1 TABLET BY MOUTH EVERYDAY AT BEDTIME, Disp: 90 tablet, Rfl: 1 .  pantoprazole (PROTONIX) 40 MG tablet, TAKE 1 TABLET BY MOUTH EVERY DAY, Disp: 90 tablet, Rfl: 1 .  Venlafaxine HCl 225 MG TB24, Take 1 tablet (225 mg total) by mouth daily., Disp: 30 each, Rfl: 2   Patient Care Team: Anitria Andon, Dionne Bucy, MD as PCP - General (Family Medicine) Watt Climes, PA as Physician Assistant (Physician Assistant) Marry Guan, Laurice Record, MD as Consulting Physician (Orthopedic Surgery) Leandrew Koyanagi, MD as Referring Physician (Ophthalmology)      Objective:   Vitals: BP 128/90 (BP Location: Left Arm, Patient Position: Sitting, Cuff Size: Normal)   Pulse 80   Temp 99 F  (37.2 C) (Oral)   Resp 16   Ht 5'  6" (1.676 m)   Wt 181 lb (82.1 kg)   BMI 29.21 kg/m    Vitals:   03/11/17 1016  BP: 128/90  Pulse: 80  Resp: 16  Temp: 99 F (37.2 C)  TempSrc: Oral  Weight: 181 lb (82.1 kg)  Height: 5\' 6"  (1.676 m)     Physical Exam  Constitutional: She is oriented to person, place, and time. She appears well-developed and well-nourished. No distress.  HENT:  Head: Normocephalic and atraumatic.  Right Ear: External ear normal.  Left Ear: External ear normal.  Nose: Nose normal.  Mouth/Throat: Oropharynx is clear and moist.  Eyes: Conjunctivae and EOM are normal. Pupils are equal, round, and reactive to light. No scleral icterus.  Neck: Neck supple. No thyromegaly present.  Cardiovascular: Normal rate, regular rhythm, normal heart sounds and intact distal pulses.  No murmur heard. Pulmonary/Chest: Effort normal and breath sounds normal. No respiratory distress. She has no wheezes. She has no rales.  Abdominal: Soft. Bowel sounds are normal. She exhibits no distension. There is no tenderness. There is no rebound and no guarding.  Musculoskeletal: She exhibits no edema or deformity.  Lymphadenopathy:    She has no cervical adenopathy.  Neurological: She is alert and oriented to person, place, and time.  Skin: Skin is warm and dry. No rash noted.  Psychiatric: Her speech is normal and behavior is normal. Judgment and thought content normal. Her affect is blunt. Cognition and memory are normal. She exhibits a depressed mood. She expresses no homicidal and no suicidal ideation.  Vitals reviewed.   Diabetic Foot Exam - Simple   Simple Foot Form Diabetic Foot exam was performed with the following findings:  Yes 03/11/2017 11:49 AM  Visual Inspection No deformities, no ulcerations, no other skin breakdown bilaterally:  Yes Sensation Testing See comments:  Yes Pulse Check Posterior Tibialis and Dorsalis pulse intact bilaterally:  Yes Comments Metatarsal  pads of R foot with decreased sensation      Depression Screen PHQ 2/9 Scores 03/11/2017 01/24/2016  PHQ - 2 Score 3 0  PHQ- 9 Score 8 -      Assessment & Plan:     Routine Health Maintenance and Physical Exam  Exercise Activities and Dietary recommendations Goals    . DIET - INCREASE WATER INTAKE     Recommend to increase water intake to 4-6 glasses a day.        Immunization History  Administered Date(s) Administered  . Influenza Split 10/24/2011  . Influenza, High Dose Seasonal PF 11/18/2013, 01/24/2016, 10/08/2016  . Influenza-Unspecified 10/24/2011, 10/14/2012, 11/18/2013  . Pneumococcal Conjugate-13 08/21/2013  . Pneumococcal Polysaccharide-23 01/24/2016  . Tdap 10/24/2011    Health Maintenance  Topic Date Due  . FOOT EXAM  03/17/1950  . HEMOGLOBIN A1C  08/27/2016  . OPHTHALMOLOGY EXAM  01/02/2017  . TETANUS/TDAP  10/23/2021  . INFLUENZA VACCINE  Completed  . DEXA SCAN  Completed  . PNA vac Low Risk Adult  Completed     Discussed health benefits of physical activity, and encouraged her to engage in regular exercise appropriate for her age and condition.    --------------------------------------------------------------------  Problem List Items Addressed This Visit      Cardiovascular and Mediastinum   Essential hypertension    Fairly well controlled Diastolic BP is borderline Continue lisinopril 5 mg daily Recheck CMP F/u in 6 months        Digestive   GERD (gastroesophageal reflux disease)    Worsening with new dysphagia Referral back  to GI Continue PPI and H2 blocker Continue Tums prn      Relevant Orders   Ambulatory referral to Gastroenterology   Dysphagia    New and worsening Referral to GI Seems to be related to GERD as above      Relevant Orders   Ambulatory referral to Gastroenterology     Endocrine   Subclinical hypothyroidism    Not on medications Recheck TSH      Relevant Orders   TSH   Type 2 diabetes mellitus  ([place])    Well controlled, diet controlled Foot exam today Will request records from most recent eye exam UTD on vaccinations On ACEi Discussed diet and exercise      Relevant Orders   POCT glycosylated hemoglobin (Hb A1C) (Completed)     Other   Hypercholesterolemia    Doing well on Atorvastatin - continue Recheck lipid panel and CMP      Relevant Orders   Comprehensive metabolic panel   Lipid panel   Clinical depression    Uncontrolled Worsening stress recently Not entirely reflected in PHQ9 as patient is embarrassed to discuss or admit that she is depressed Will continue bailify 2mg  daily - counld increase in the future if needed Increase Effexor to 225mg  daily Advised on importance of counseling/therapy      Relevant Medications   Venlafaxine HCl 225 MG TB24   Cramps of lower extremity    Check CBC, TSH, CMP, Magnesium levels Chronic problem      Relevant Orders   Lipid panel   Magnesium   Overweight    Discussed healthy weight management, diet and exercise       Other Visit Diagnoses    Encounter for annual physical exam    -  Primary   Relevant Orders   CBC w/Diff/Platelet      Return in about 3 months (around 06/08/2017) for depression f/u.   The entirety of the information documented in the History of Present Illness, Review of Systems and Physical Exam were personally obtained by me. Portions of this information were initially documented by Raquel Sarna Ratchford, CMA and reviewed by me for thoroughness and accuracy.    Virginia Crews, MD, MPH [facility] 03/11/2017 11:47 AM

## 2017-03-11 NOTE — Assessment & Plan Note (Signed)
Check CBC, TSH, CMP, Magnesium levels Chronic problem

## 2017-03-11 NOTE — Assessment & Plan Note (Signed)
Not on medications Recheck TSH

## 2017-03-11 NOTE — Assessment & Plan Note (Signed)
Well controlled, diet controlled Foot exam today Will request records from most recent eye exam UTD on vaccinations On ACEi Discussed diet and exercise

## 2017-03-11 NOTE — Progress Notes (Signed)
Subjective:   Natasha Vasquez is a 77 y.o. female who presents for Medicare Annual (Subsequent) preventive examination.  Review of Systems:  N/A Cardiac Risk Factors include: advanced age (>48men, >39 women);diabetes mellitus;dyslipidemia;hypertension;sedentary lifestyle     Objective:     Vitals: BP 124/90 (BP Location: Left Arm)   Pulse 80   Temp 99 F (37.2 C) (Oral)   Ht 5\' 6"  (1.676 m)   Wt 181 lb 6.4 oz (82.3 kg)   BMI 29.28 kg/m   Body mass index is 29.28 kg/m.  Advanced Directives 03/11/2017 03/14/2016 03/01/2016 02/01/2015  Does Patient Have a Medical Advance Directive? Yes Yes Yes Yes  Type of Paramedic of [place];Living will [place];Living will [place];Living will -  Does patient want to make changes to medical advance directive? - No - Patient declined - -  Copy of [place] in Chart? No - copy requested No - copy requested No - copy requested -    Tobacco Social History   Tobacco Use  Smoking Status Former Smoker  . Types: Cigarettes  Smokeless Tobacco Never Used  Tobacco Comment   smoked in college     Counseling given: Not Answered Comment: smoked in college   Clinical Intake:  Pre-visit preparation completed: Yes  Pain : No/denies pain Pain Score: 0-No pain     Nutritional Status: BMI 25 -29 Overweight Nutritional Risks: None Diabetes: Yes(type 2) CBG done?: No Did pt. bring in CBG monitor from home?: No  How often do you need to have someone help you when you read instructions, pamphlets, or other written materials from your doctor or pharmacy?: 1 - Never  Interpreter Needed?: No  Information entered by :: [facility], LPN  Past Medical History:  Diagnosis Date  . Anemia in pregnancy   . Arthritis    hands  . Barrett esophagus   . Cancer ([place])    melanoma  . Depression   . Depression   . Diabetes mellitus without complication (HCC)    diet  controlled  . Family history of adverse reaction to anesthesia    son PONV  . GERD (gastroesophageal reflux disease)   . Hypercholesteremia   . Hyperlipidemia   . Hypertension   . MVP (mitral valve prolapse)    followed by PCP  . Nephritis   . Nephritis   . S/P appendectomy   . Torn meniscus    left   Past Surgical History:  Procedure Laterality Date  . ABDOMINAL HYSTERECTOMY    . APPENDECTOMY    . BREAST CYST ASPIRATION Bilateral    NEG  . BREAST EXCISIONAL BIOPSY Left 20+ yrs ago   NEG  . BREAST LUMPECTOMY Left    benign  . BROW LIFT Bilateral 02/01/2015   Procedure: BLEPHAROPLASTY bilateral upper eyelids.;  Surgeon: Karle Starch, MD;  Location: [facility];  Service: Ophthalmology;  Laterality: Bilateral;  DIABETIC - diet controlled  . CHOLECYSTECTOMY    . COLONOSCOPY    . COLONOSCOPY WITH PROPOFOL N/A 05/11/2015   Procedure: COLONOSCOPY WITH PROPOFOL;  Surgeon: Manya Silvas, MD;  Location: [facility] ENDOSCOPY;  Service: Endoscopy;  Laterality: N/A;  . ESOPHAGOGASTRODUODENOSCOPY    . ESOPHAGOGASTRODUODENOSCOPY (EGD) WITH PROPOFOL  05/11/2015   Procedure: ESOPHAGOGASTRODUODENOSCOPY (EGD) WITH PROPOFOL;  Surgeon: Manya Silvas, MD;  Location: [facility] ENDOSCOPY;  Service: Endoscopy;;  . JOINT REPLACEMENT    . KNEE ARTHROSCOPY Left 03/14/2016   Procedure: ARTHROSCOPY KNEE, PARTIAL MEDIAL  MENISECTOMY, CHONDROPLASTY;  Surgeon: Dereck Leep, MD;  Location: [facility] ORS;  Service: Orthopedics;  Laterality: Left;  . PARTIAL HIP ARTHROPLASTY Right   . TONSILLECTOMY     Family History  Problem Relation Age of Onset  . Cancer Mother        colon/rectal  . Heart disease Father   . Hypertension Father   . Atrial fibrillation Father   . Diabetes Father   . Cancer Brother        lung  . Breast cancer Neg Hx    Social History   Socioeconomic History  . Marital status: Married    Spouse name: None  . Number of children: 2  . Years of education: None  . Highest education  level: Master's degree (e.g., MA, MS, MEng, MEd, MSW, MBA)  Social Needs  . Financial resource strain: Not hard at all  . Food insecurity - worry: Never true  . Food insecurity - inability: Never true  . Transportation needs - medical: No  . Transportation needs - non-medical: No  Occupational History  . Occupation: retired  Tobacco Use  . Smoking status: Former Smoker    Types: Cigarettes  . Smokeless tobacco: Never Used  . Tobacco comment: smoked in college  Substance and Sexual Activity  . Alcohol use: Yes    Alcohol/week: 4.2 - 8.4 oz    Types: 7 - 14 Glasses of wine per week  . Drug use: No  . Sexual activity: No  Other Topics Concern  . None  Social History Narrative  . None    Outpatient Encounter Medications as of 03/11/2017  Medication Sig  . acetaminophen (TYLENOL) 500 MG tablet Take 1,000 mg by mouth every 6 (six) hours as needed for mild pain.   . ARIPiprazole (ABILIFY) 2 MG tablet TAKE 1 TABLET (2 MG TOTAL) BY MOUTH EVERY EVENING.  Marland Kitchen aspirin 81 MG tablet Take 81 mg by mouth daily.  Marland Kitchen atorvastatin (LIPITOR) 20 MG tablet TAKE 1 TABLET (20 MG TOTAL) BY MOUTH DAILY.  Marland Kitchen bismuth subsalicylate (PEPTO BISMOL) 262 MG chewable tablet Chew 524 mg by mouth as needed.  . calcium carbonate (TUMS SMOOTHIES) 750 MG chewable tablet Chew 1 tablet by mouth 3 (three) times daily as needed for heartburn.  . clindamycin (CLEOCIN) 150 MG capsule Take 600 mg by mouth once. PRIOR TO DENTAL APPOINTMENT  . famotidine (PEPCID) 20 MG tablet Take 20 mg by mouth daily as needed for heartburn or indigestion.  Marland Kitchen lisinopril (PRINIVIL,ZESTRIL) 5 MG tablet Take 1 tablet (5 mg total) by mouth daily.  . Multiple Vitamins-Minerals ([facility] SPECTRAVITE PO) Take 1 tablet by mouth daily. Centrum silver  . oxybutynin (DITROPAN-XL) 10 MG 24 hr tablet TAKE 1 TABLET BY MOUTH EVERYDAY AT BEDTIME  . pantoprazole (PROTONIX) 40 MG tablet TAKE 1 TABLET BY MOUTH EVERY DAY  . Venlafaxine HCl 150 MG TB24 Take 1 tablet  (150 mg total) by mouth daily.  . [DISCONTINUED] ARIPiprazole (ABILIFY) 2 MG tablet Take by mouth.  . [DISCONTINUED] bismuth subsalicylate (PEPTO BISMOL) 262 MG/15ML suspension Take 30 mLs by mouth as needed.  . [DISCONTINUED] GARCINIA CAMBO[place]IA-CHROMIUM PO Take 1-2 capsules by mouth daily.  . [DISCONTINUED] HYDROcodone-acetaminophen (NORCO) 5-325 MG tablet Take 1-2 tablets by mouth every 4 (four) hours as needed for moderate pain.  . [DISCONTINUED] omeprazole (PRILOSEC) 20 MG capsule Take 1 capsule (20 mg total) by mouth 2 (two) times daily before a meal.   No facility-administered encounter medications on file as of 03/11/2017.  Activities of Daily Living In your present state of health, do you have any difficulty performing the following activities: 03/11/2017  Hearing? N  Vision? N  Difficulty concentrating or making decisions? N  Walking or climbing stairs? Y  Comment Due to SOB and knee pain.   Dressing or bathing? N  Doing errands, shopping? N  Preparing Food and eating ? N  Using the Toilet? N  In the past six months, have you accidently leaked urine? N  Comment Occasionally when coughing, wears protection @ night time.  Do you have problems with loss of bowel control? N  Comment Occasionally when coughing.  Managing your Medications? N  Managing your Finances? N  Housekeeping or managing your Housekeeping? N  Some recent data might be hidden    Patient Care Team: Virginia Crews, MD as PCP - General (Family Medicine) Watt Climes, PA as Physician Assistant (Physician Assistant) Marry Guan Laurice Record, MD as Consulting Physician (Orthopedic Surgery) Leandrew Koyanagi, MD as Referring Physician (Ophthalmology)    Assessment:   This is a routine wellness examination for [place].  Exercise Activities and Dietary recommendations Current Exercise Habits: The patient does not participate in regular exercise at present, Exercise limited by: Other - see comments(Has been busy  recently due to moving. )  Goals    . DIET - INCREASE WATER INTAKE     Recommend to increase water intake to 4-6 glasses a day.        Fall Risk Fall Risk  03/11/2017 01/24/2016  Falls in the past year? No Yes  Number falls in past yr: - 2 or more   Is the patient's home free of loose throw rugs in walkways, pet beds, electrical cords, etc?   yes      Grab bars in the bathroom? yes      Handrails on the stairs?   yes      Adequate lighting?   yes  Timed Get Up and Go performed: N/A  Depression Screen PHQ 2/9 Scores 03/11/2017 01/24/2016  PHQ - 2 Score 3 0  PHQ- 9 Score 8 -     Cognitive Function     6CIT Screen 03/11/2017  What Year? 0 points  What month? 0 points  What time? 0 points  Count back from 20 0 points  Months in reverse 0 points  Repeat phrase 0 points  Total Score 0    Immunization History  Administered Date(s) Administered  . Influenza Split 10/24/2011  . Influenza, High Dose Seasonal PF 11/18/2013, 01/24/2016, 10/08/2016  . Influenza-Unspecified 10/24/2011, 10/14/2012, 11/18/2013  . Pneumococcal Conjugate-13 08/21/2013  . Pneumococcal Polysaccharide-23 01/24/2016  . Tdap 10/24/2011    Qualifies for Shingles Vaccine? Up to date  Screening Tests Health Maintenance  Topic Date Due  . FOOT EXAM  03/17/1950  . HEMOGLOBIN A1C  08/27/2016  . OPHTHALMOLOGY EXAM  01/02/2017  . TETANUS/TDAP  10/23/2021  . INFLUENZA VACCINE  Completed  . DEXA SCAN  Completed  . PNA vac Low Risk Adult  Completed    Cancer Screenings: Lung: Low Dose CT Chest recommended if Age 63-80 years, 30 pack-year currently smoking OR have quit w/in 15years. Patient does not qualify. Breast:  Up to date on Mammogram? Yes   Up to date of Bone Density/Dexa? Yes Colorectal: Up to date  Additional Screenings:  Hepatitis C Screening: N/A     Plan:  I have personally reviewed and addressed the Medicare Annual Wellness questionnaire and have noted the following in the patient's  chart:  A. Medical and social history B. Use of alcohol, tobacco or illicit drugs  C. Current medications and supplements D. Functional ability and status E.  Nutritional status F.  Physical activity G. Advance directives H. List of other physicians I.  Hospitalizations, surgeries, and ER visits in previous 12 months J.  [place] such as hearing and vision if needed, cognitive and depression L. Referrals and appointments - none  In addition, I have reviewed and discussed with patient certain preventive protocols, quality metrics, and best practice recommendations. A written personalized care plan for preventive services as well as general preventive health recommendations were provided to patient.  See attached scanned questionnaire for additional information.   Signed,  Fabio Neighbors, LPN Nurse Health Advisor   Nurse Recommendations: Pt needs her Hgb A1c checked and a diabetic foot exam today. Pt states she is due for an eye exam and plans to schedule once she receives her notification from [facility]. Requested records once completed.

## 2017-03-21 ENCOUNTER — Encounter: Payer: Self-pay | Admitting: Family Medicine

## 2017-03-22 LAB — LIPID PANEL
CHOL/HDL RATIO: 4.3 ratio (ref 0.0–4.4)
Cholesterol, Total: 187 mg/dL (ref 100–199)
HDL: 43 mg/dL (ref >39)
LDL Calculated: 97 mg/dL (ref 0–99)
TRIGLYCERIDES: 237 mg/dL — AB (ref 0–149)
VLDL Cholesterol Cal: 47 mg/dL — ABNORMAL HIGH (ref 5–40)

## 2017-03-22 LAB — CBC WITH DIFFERENTIAL/PLATELET
BASOS: 2 %
Basophils Absolute: 0.1 10*3/uL (ref 0.0–0.2)
EOS (ABSOLUTE): 0.2 10*3/uL (ref 0.0–0.4)
Eos: 4 %
HEMATOCRIT: 34.5 % (ref 34.0–46.6)
HEMOGLOBIN: 10.6 g/dL — AB (ref 11.1–15.9)
IMMATURE GRANS (ABS): 0 10*3/uL (ref 0.0–0.1)
Immature Granulocytes: 0 %
LYMPHS: 28 %
Lymphocytes Absolute: 1.7 10*3/uL (ref 0.7–3.1)
MCH: 22.6 pg — AB (ref 26.6–33.0)
MCHC: 30.7 g/dL — ABNORMAL LOW (ref 31.5–35.7)
MCV: 73 fL — AB (ref 79–97)
MONOCYTES: 7 %
Monocytes Absolute: 0.4 10*3/uL (ref 0.1–0.9)
NEUTROS ABS: 3.5 10*3/uL (ref 1.4–7.0)
Neutrophils: 59 %
Platelets: 457 10*3/uL — ABNORMAL HIGH (ref 150–379)
RBC: 4.7 x10E6/uL (ref 3.77–5.28)
RDW: 17.3 % — ABNORMAL HIGH (ref 12.3–15.4)
WBC: 6 10*3/uL (ref 3.4–10.8)

## 2017-03-22 LAB — COMPREHENSIVE METABOLIC PANEL WITH GFR
ALT: 19 IU/L (ref 0–32)
AST: 16 IU/L (ref 0–40)
Albumin/Globulin Ratio: 1.9 (ref 1.2–2.2)
Albumin: 4.7 g/dL (ref 3.5–4.8)
Alkaline Phosphatase: 96 IU/L (ref 39–117)
BUN/Creatinine Ratio: 15 (ref 12–28)
BUN: 15 mg/dL (ref 8–27)
Bilirubin Total: 0.4 mg/dL (ref 0.0–1.2)
CO2: 25 mmol/L (ref 20–29)
Calcium: 9.6 mg/dL (ref 8.7–10.3)
Chloride: 103 mmol/L (ref 96–106)
Creatinine, Ser: 1.01 mg/dL — ABNORMAL HIGH (ref 0.57–1.00)
GFR calc Af Amer: 62 mL/min/1.73
GFR calc non Af Amer: 54 mL/min/1.73 — ABNORMAL LOW
Globulin, Total: 2.5 g/dL (ref 1.5–4.5)
Glucose: 130 mg/dL — ABNORMAL HIGH (ref 65–99)
Potassium: 4.6 mmol/L (ref 3.5–5.2)
Sodium: 143 mmol/L (ref 134–144)
Total Protein: 7.2 g/dL (ref 6.0–8.5)

## 2017-03-22 LAB — MAGNESIUM: Magnesium: 1.8 mg/dL (ref 1.6–2.3)

## 2017-03-22 LAB — TSH: TSH: 3.65 u[IU]/mL (ref 0.450–4.500)

## 2017-03-25 ENCOUNTER — Telehealth: Payer: Self-pay

## 2017-03-25 NOTE — Telephone Encounter (Signed)
-----   Message from Virginia Crews, MD sent at 03/25/2017  8:44 AM EDT ----- Stable kidney function, liver function, electrolytes, Blood counts, cholesterol, thyroid function.  Virginia Crews, MD, MPH [facility] 03/25/2017 8:44 AM

## 2017-03-25 NOTE — Telephone Encounter (Signed)
Pt advised.   Thanks,   -Tyquan Carmickle  

## 2017-04-02 ENCOUNTER — Other Ambulatory Visit: Payer: Self-pay | Admitting: Physician Assistant

## 2017-04-02 ENCOUNTER — Ambulatory Visit
Admission: RE | Admit: 2017-04-02 | Discharge: 2017-04-02 | Disposition: A | Discharge status: Home / Self Care | Payer: Medicare Other | Source: Ambulatory Visit | Attending: Family Medicine | Admitting: Family Medicine

## 2017-04-02 DIAGNOSIS — Z1231 Encounter for screening mammogram for malignant neoplasm of breast: Secondary | ICD-10-CM | POA: Diagnosis present

## 2017-04-02 DIAGNOSIS — E7800 Pure hypercholesterolemia, unspecified: Secondary | ICD-10-CM

## 2017-04-04 ENCOUNTER — Telehealth: Payer: Self-pay | Admitting: Family Medicine

## 2017-04-04 MED ORDER — VENLAFAXINE HCL ER 225 MG PO TB24: 225.0000 mg | ORAL_TABLET | Freq: Every day | ORAL | 2 refills | Status: DC

## 2017-04-04 NOTE — Telephone Encounter (Signed)
Ok to send in 90 day prescription?

## 2017-04-04 NOTE — Telephone Encounter (Signed)
[facility]ed a [facility]ill request for a 90-days supply for the following medication. Thanks CC  Venlafaxine HCl 225 MG TB24

## 2017-04-04 NOTE — Telephone Encounter (Signed)
Rx sent  Virginia Crews, MD, MPH [facility] 04/04/2017 4:27 PM

## 2017-04-08 ENCOUNTER — Other Ambulatory Visit: Payer: Self-pay | Admitting: Family Medicine

## 2017-04-08 DIAGNOSIS — R928 Other abnormal and inconclusive findings on diagnostic imaging of breast: Secondary | ICD-10-CM

## 2017-04-08 DIAGNOSIS — N6310 Unspecified lump in the right breast, unspecified quadrant: Secondary | ICD-10-CM

## 2017-04-08 DIAGNOSIS — R921 Mammographic calcification found on diagnostic imaging of breast: Secondary | ICD-10-CM

## 2017-04-16 ENCOUNTER — Ambulatory Visit
Admission: RE | Admit: 2017-04-16 | Discharge: 2017-04-16 | Disposition: A | Discharge status: Home / Self Care | Payer: Medicare Other | Source: Ambulatory Visit | Attending: Family Medicine | Admitting: Family Medicine

## 2017-04-16 ENCOUNTER — Other Ambulatory Visit: Payer: Self-pay | Admitting: Family Medicine

## 2017-04-16 DIAGNOSIS — R921 Mammographic calcification found on diagnostic imaging of breast: Secondary | ICD-10-CM

## 2017-04-16 DIAGNOSIS — N6311 Unspecified lump in the right breast, upper outer quadrant: Secondary | ICD-10-CM | POA: Diagnosis not present

## 2017-04-16 DIAGNOSIS — N6310 Unspecified lump in the right breast, unspecified quadrant: Secondary | ICD-10-CM

## 2017-04-16 DIAGNOSIS — R928 Other abnormal and inconclusive findings on diagnostic imaging of breast: Secondary | ICD-10-CM

## 2017-04-16 DIAGNOSIS — N6341 Unspecified lump in right breast, subareolar: Secondary | ICD-10-CM | POA: Diagnosis not present

## 2017-04-25 ENCOUNTER — Ambulatory Visit
Admission: RE | Admit: 2017-04-25 | Discharge: 2017-04-25 | Disposition: A | Discharge status: Home / Self Care | Payer: Medicare Other | Source: Ambulatory Visit | Attending: Family Medicine | Admitting: Family Medicine

## 2017-04-25 DIAGNOSIS — R928 Other abnormal and inconclusive findings on diagnostic imaging of breast: Secondary | ICD-10-CM | POA: Diagnosis present

## 2017-04-25 DIAGNOSIS — R921 Mammographic calcification found on diagnostic imaging of breast: Secondary | ICD-10-CM | POA: Insufficient documentation

## 2017-04-25 DIAGNOSIS — N6310 Unspecified lump in the right breast, unspecified quadrant: Secondary | ICD-10-CM

## 2017-04-25 DIAGNOSIS — D0511 Intraductal carcinoma in situ of right breast: Secondary | ICD-10-CM | POA: Insufficient documentation

## 2017-04-25 HISTORY — PX: BREAST BIOPSY: SHX20

## 2017-04-26 ENCOUNTER — Other Ambulatory Visit: Payer: Self-pay | Admitting: Pathology

## 2017-04-29 ENCOUNTER — Other Ambulatory Visit: Payer: Self-pay | Admitting: Family Medicine

## 2017-04-29 ENCOUNTER — Other Ambulatory Visit: Payer: Self-pay | Admitting: Pathology

## 2017-04-29 DIAGNOSIS — N6310 Unspecified lump in the right breast, unspecified quadrant: Secondary | ICD-10-CM

## 2017-04-29 DIAGNOSIS — R928 Other abnormal and inconclusive findings on diagnostic imaging of breast: Secondary | ICD-10-CM

## 2017-04-29 LAB — SURGICAL PATHOLOGY

## 2017-05-02 ENCOUNTER — Encounter: Payer: Self-pay | Admitting: *Deleted

## 2017-05-02 NOTE — Progress Notes (Signed)
  Oncology Nurse Navigator Documentation  Navigator Location: [facility] (05/02/17 1000)   )Navigator Encounter Type: Introductory phone call (05/02/17 1000)                                                    Time Spent with Patient: 15 (05/02/17 1000)   Called patient to establish navigation services.  No  Answer.  Left message to return my call.

## 2017-05-03 ENCOUNTER — Ambulatory Visit
Admission: RE | Admit: 2017-05-03 | Discharge: 2017-05-03 | Disposition: A | Discharge status: Home / Self Care | Payer: Medicare Other | Source: Ambulatory Visit | Attending: Family Medicine | Admitting: Family Medicine

## 2017-05-03 DIAGNOSIS — R928 Other abnormal and inconclusive findings on diagnostic imaging of breast: Secondary | ICD-10-CM

## 2017-05-03 DIAGNOSIS — N6310 Unspecified lump in the right breast, unspecified quadrant: Secondary | ICD-10-CM

## 2017-05-03 DIAGNOSIS — D0511 Intraductal carcinoma in situ of right breast: Secondary | ICD-10-CM | POA: Diagnosis not present

## 2017-05-03 HISTORY — PX: BREAST BIOPSY: SHX20

## 2017-05-06 LAB — SURGICAL PATHOLOGY

## 2017-05-07 ENCOUNTER — Encounter: Payer: Self-pay | Admitting: General Surgery

## 2017-05-07 ENCOUNTER — Ambulatory Visit: Payer: Medicare Other | Admitting: General Surgery

## 2017-05-07 VITALS — BP 140/80 | HR 74 | Resp 14 | Ht 65.0 in | Wt 178.0 lb

## 2017-05-07 DIAGNOSIS — D0511 Intraductal carcinoma in situ of right breast: Secondary | ICD-10-CM | POA: Diagnosis not present

## 2017-05-07 NOTE — Progress Notes (Signed)
Patient ID: Natasha Vasquez, female   DOB: 07/17/1940, 77 y.o.   MRN: [id number]  Chief Complaint  Patient presents with  . Other    HPI Natasha Vasquez is a 77 y.o. female who presents for a breast evaluation. The most recent mammogram was done on 04/02/2017 right breast biopsy on 04/25/2017 and stereo biopsy on 05/03/2017.   Marland Kitchen  Patient does perform regular self breast checks and gets regular mammograms done.   Husband, Sonia Side is present at visit.  HPI  Past Medical History:  Diagnosis Date  . Anemia in pregnancy   . Arthritis    hands  . Barrett esophagus   . Cancer ([place])    melanoma  . Depression   . Depression   . Diabetes mellitus without complication (HCC)    diet controlled  . Family history of adverse reaction to anesthesia    son PONV  . GERD (gastroesophageal reflux disease)   . Hypercholesteremia   . Hyperlipidemia   . Hypertension   . MVP (mitral valve prolapse)    followed by PCP  . Nephritis   . Nephritis   . S/P appendectomy   . Torn meniscus    left    Past Surgical History:  Procedure Laterality Date  . ABDOMINAL HYSTERECTOMY    . APPENDECTOMY    . BREAST BIOPSY Right 04/25/2017   retroareolar 10:00   wing clip   path pending  . BREAST BIOPSY Right 04/25/2017   10:00  5CMFN  venus clip    path pending  . BREAST BIOPSY Right 05/03/2017   Affirm Bx- path pending  . BREAST CYST ASPIRATION Bilateral    NEG  . BREAST EXCISIONAL BIOPSY Left 20+ yrs ago   NEG  . BROW LIFT Bilateral 02/01/2015   Procedure: BLEPHAROPLASTY bilateral upper eyelids.;  Surgeon: Karle Starch, MD;  Location: [place];  Service: Ophthalmology;  Laterality: Bilateral;  DIABETIC - diet controlled  . CHOLECYSTECTOMY    . COLONOSCOPY    . COLONOSCOPY WITH PROPOFOL N/A 05/11/2015   Procedure: COLONOSCOPY WITH PROPOFOL;  Surgeon: Manya Silvas, MD;  Location: [facility] ENDOSCOPY;  Service: Endoscopy;  Laterality: N/A;  . ESOPHAGOGASTRODUODENOSCOPY    .  ESOPHAGOGASTRODUODENOSCOPY (EGD) WITH PROPOFOL  05/11/2015   Procedure: ESOPHAGOGASTRODUODENOSCOPY (EGD) WITH PROPOFOL;  Surgeon: Manya Silvas, MD;  Location: [facility] ENDOSCOPY;  Service: Endoscopy;;  . JOINT REPLACEMENT    . KNEE ARTHROSCOPY Left 03/14/2016   Procedure: ARTHROSCOPY KNEE, PARTIAL MEDIAL MENISECTOMY, CHONDROPLASTY;  Surgeon: Dereck Leep, MD;  Location: [facility] ORS;  Service: Orthopedics;  Laterality: Left;  . PARTIAL HIP ARTHROPLASTY Right   . TONSILLECTOMY      Family History  Problem Relation Age of Onset  . Cancer Mother        colon/rectal  . Colon cancer Mother   . Heart disease Father   . Hypertension Father   . Atrial fibrillation Father   . Diabetes Father   . Cancer Brother        lung  . Healthy Son   . Healthy Son   . Breast cancer Neg Hx     Social History Social History   Tobacco Use  . Smoking status: Former Smoker    Types: Cigarettes  . Smokeless tobacco: Never Used  . Tobacco comment: smoked in college  Substance Use Topics  . Alcohol use: Yes    Alcohol/week: 4.2 - 8.4 oz    Types: 7 - 14 Glasses of wine per week  Comment: 1-2 glasses of wine per night  . Drug use: No    Allergies  Allergen Reactions  . Aleve [Naproxen Sodium]   . Codeine Other (See Comments)    Chest pain  . Opium Other (See Comments)    Chest pain  . Oxycodone Other (See Comments)    MENTAL CHANGES  . Penicillins     Has patient had a PCN reaction causing immediate rash, facial/tongue/throat swelling, SOB or lightheadedness with hypotension: Yes Has patient had a PCN reaction causing severe rash involving mucus membranes or skin necrosis: No Has patient had a PCN reaction that required hospitalization: NO Has patient had a PCN reaction occurring within the last 10 years: No If all of the above answers are "NO", then may proceed with Cephalosporin use.  Can take Augmentin  . Naproxen Hives and Rash    And headaches    Current Outpatient Medications   Medication Sig Dispense Refill  . acetaminophen (TYLENOL) 500 MG tablet Take 1,000 mg by mouth every 6 (six) hours as needed for mild pain.     . ARIPiprazole (ABILIFY) 2 MG tablet TAKE 1 TABLET (2 MG TOTAL) BY MOUTH EVERY EVENING. 90 tablet 1  . aspirin 81 MG tablet Take 81 mg by mouth daily.    Marland Kitchen atorvastatin (LIPITOR) 20 MG tablet TAKE 1 TABLET BY MOUTH EVERY DAY 90 tablet 1  . bismuth subsalicylate (PEPTO BISMOL) 262 MG chewable tablet Chew 524 mg by mouth as needed.    . calcium carbonate (TUMS SMOOTHIES) 750 MG chewable tablet Chew 1 tablet by mouth 3 (three) times daily as needed for heartburn.    . clindamycin (CLEOCIN) 150 MG capsule Take 600 mg by mouth once. PRIOR TO DENTAL APPOINTMENT    . famotidine (PEPCID) 20 MG tablet Take 20 mg by mouth daily as needed for heartburn or indigestion.    Marland Kitchen lisinopril (PRINIVIL,ZESTRIL) 5 MG tablet Take 1 tablet (5 mg total) by mouth daily. 90 tablet 1  . Multiple Vitamins-Minerals ([facility] SPECTRAVITE PO) Take 1 tablet by mouth daily. Centrum silver    . oxybutynin (DITROPAN-XL) 10 MG 24 hr tablet TAKE 1 TABLET BY MOUTH EVERYDAY AT BEDTIME 90 tablet 1  . pantoprazole (PROTONIX) 40 MG tablet TAKE 1 TABLET BY MOUTH EVERY DAY 90 tablet 1  . Venlafaxine HCl 225 MG TB24 Take 1 tablet (225 mg total) by mouth daily. 90 each 2   No current facility-administered medications for this visit.     Review of Systems Review of Systems  Constitutional: Negative.   Respiratory: Negative.   Cardiovascular: Negative.     Blood pressure 140/80, pulse 74, resp. rate 14, height 5\' 5"  (1.651 m), weight 178 lb (80.7 kg).  Physical Exam Physical Exam  Constitutional: She is oriented to person, place, and time. She appears well-developed and well-nourished.  Eyes: Conjunctivae are normal.  Neck: Neck supple.  Cardiovascular: Normal rate, regular rhythm and normal heart sounds.  Pulmonary/Chest: Effort normal and breath sounds normal. Right breast exhibits no  inverted nipple, no nipple discharge, no skin change and no tenderness. Left breast exhibits no inverted nipple, no mass, no nipple discharge, no skin change and no tenderness.    Lymphadenopathy:    She has no cervical adenopathy.  Neurological: She is alert and oriented to person, place, and time.  Skin: Skin is warm and dry.    Data Reviewed Laboratory studies from March 21, 2017 showed a hemoglobin of 10.6, unchanged from 1 year ago.  MCV 73,  white blood cell count 6000, platelet count of 457,000. Comprehensive metabolic panel of the same date showed minimal decrease in the GFR at 54.  Blood sugar 130.  Normal electrolytes. Hemoglobin A1c: 6.2.  April 25, 2017 biopsy x2: A. BREAST, RIGHT 10:00 SUBAREOLAR; ULTRASOUND-GUIDED BIOPSY:  - HIGH-GRADE DUCTAL CARCINOMA IN SITU, COMEDO TYPE.   B. BREAST, RIGHT 10:00 5 CMFN; ULTRASOUND-GUIDED BIOPSY:  - HIGH-GRADE DUCTAL CARCINOMA IN SITU, COMEDO TYPE.   May 03, 2017 biopsy: DIAGNOSIS:  A. RIGHT BREAST, UPPER OUTER QUADRANT; STEREOTACTIC BIOPSY:  - DUCTAL CARCINOMA IN SITU, HIGH NUCLEAR GRADE WITH COMEDONECROSIS AND  CALCIFICATIONS.   March 12, 2016 bilateral screening mammograms: BI-RADS-1.  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   Plan  The patient would be best served by mastectomy.  Whether or not she desires to proceed with reconstruction would be best be determined with consultation with plastic surgery.  The patient has some travel plans in June with a group of friends dating back to college years and [place] that she would like to attend.  The patient has a noninvasive cancer, but we discussed there is a possibility with small foci of invasive cancer being detected at the time of surgery and that sentinel node biopsy would be undertaken.  Prior to making a final decision on mastectomy with or without reconstruction she would like to meet with the plastic surgery service and  an appointment will be made with Audelia Hives, DO in this regard.   HPI, Physical Exam, Assessment and Plan have been scribed under the direction and in the presence of Hervey Ard, MD.  Gaspar Cola, CMA   I have completed the exam and reviewed the above documentation for accuracy and completeness.  I agree with the above.  Haematologist has been used and any errors in dictation or transcription are unintentional.  Hervey Ard, M.D., F.A.C.S. [place] [facility] Jovany Disano 05/07/2017, 9:02 PM

## 2017-05-08 ENCOUNTER — Telehealth: Payer: Self-pay | Admitting: General Surgery

## 2017-05-08 NOTE — Telephone Encounter (Signed)
ERROR

## 2017-05-09 DIAGNOSIS — C50419 Malignant neoplasm of upper-outer quadrant of unspecified female breast: Secondary | ICD-10-CM | POA: Insufficient documentation

## 2017-05-10 ENCOUNTER — Encounter: Payer: Self-pay | Admitting: General Surgery

## 2017-05-10 ENCOUNTER — Other Ambulatory Visit: Payer: Self-pay

## 2017-05-10 ENCOUNTER — Telehealth: Payer: Self-pay

## 2017-05-10 DIAGNOSIS — D0511 Intraductal carcinoma in situ of right breast: Secondary | ICD-10-CM

## 2017-05-10 NOTE — Telephone Encounter (Signed)
Spoke with the patient about a surgery date. I let her know I spoke with Shayna at Dr Dillingham's office and 06/03/17 would be the soonest date we could do this. She is amendable to this. I let her know that I would be in contact with her to give her her arrival time and location for her surgery day and also her pre admit date and time.

## 2017-05-13 ENCOUNTER — Telehealth: Payer: Self-pay | Admitting: *Deleted

## 2017-05-13 NOTE — Telephone Encounter (Signed)
Natasha Vasquez's surgery has been scheduled for 06-03-17 at [facility]. The Natasha Vasquez is aware of arrival time day of surgery. She has also been notified of her Pre-admission appointment date and time. Paperwork to be mailed to the Natasha Vasquez today.   It is okay for Natasha Vasquez to continue an 81 mg aspirin once daily.   The Natasha Vasquez is aware to call the office if she has further questions.   *Natasha Vasquez also states that Dr. Vira Agar will be doing an upper endoscopy on 05-23-17 due to choking when swallowing and she wants to double check with Dr. Bary Castilla to make sure this will be okay with going under anesthesia for her breast surgery later in the month.

## 2017-05-15 ENCOUNTER — Ambulatory Visit
Admission: RE | Admit: 2017-05-15 | Discharge: 2017-05-15 | Disposition: A | Discharge status: Home / Self Care | Payer: Medicare Other | Source: Ambulatory Visit | Attending: Physician Assistant | Admitting: Physician Assistant

## 2017-05-15 ENCOUNTER — Encounter: Payer: Self-pay | Admitting: Physician Assistant

## 2017-05-15 ENCOUNTER — Ambulatory Visit: Payer: Medicare Other | Admitting: Physician Assistant

## 2017-05-15 VITALS — BP 110/78 | HR 79 | Temp 98.2°F | Wt 181.8 lb

## 2017-05-15 DIAGNOSIS — E119 Type 2 diabetes mellitus without complications: Secondary | ICD-10-CM

## 2017-05-15 DIAGNOSIS — M25474 Effusion, right foot: Secondary | ICD-10-CM

## 2017-05-15 DIAGNOSIS — I10 Essential (primary) hypertension: Secondary | ICD-10-CM | POA: Diagnosis not present

## 2017-05-15 DIAGNOSIS — M7989 Other specified soft tissue disorders: Secondary | ICD-10-CM | POA: Diagnosis not present

## 2017-05-15 NOTE — Progress Notes (Addendum)
Patient: Natasha Vasquez Female    DOB: [birth year]   77 y.o.   MRN: [id number] Visit Date: 05/15/2017  Today's Provider: Trinna Post, PA-C   Chief Complaint  Patient presents with  . Foot Pain   Subjective:    Natasha Vasquez is a 77 y/o woman with history of DCIS, Type II Dm, HTN presenting today for right foot pain and redness. She reports the redness started a day or toe ago on her right great toe. She denies any injuries, prior surgery to her foot. She denies any cuts or scrapes. She is treated for HTN with Lisinopril, not on any diuretic. She says the pain started gradually and increased in swelling and redness slowly. It is tender to the touch. She recently wore some strappy sandals that she reports were a relief to get off due to the pressure. She denies fevers, chills, nausea, vomiting, loss of sensation in foot.   Leg Pain   Incident onset: yesterday. There was no injury mechanism. The pain is present in the right foot. The quality of the pain is described as burning. The pain has been worsening since onset. Associated symptoms comments: Redness and swelling . The symptoms are aggravated by weight bearing (walking). She has tried nothing for the symptoms.       Allergies  Allergen Reactions  . Aleve [Naproxen Sodium]   . Codeine Other (See Comments)    Chest pain  . Opium Other (See Comments)    Chest pain  . Oxycodone Other (See Comments)    MENTAL CHANGES  . Penicillins     Has patient had a PCN reaction causing immediate rash, facial/tongue/throat swelling, SOB or lightheadedness with hypotension: Yes Has patient had a PCN reaction causing severe rash involving mucus membranes or skin necrosis: No Has patient had a PCN reaction that required hospitalization: NO Has patient had a PCN reaction occurring within the last 10 years: No If all of the above answers are "NO", then may proceed with Cephalosporin use.  Can take Augmentin  . Naproxen Hives and Rash     And headaches     Current Outpatient Medications:  .  acetaminophen (TYLENOL) 500 MG tablet, Take 1,000 mg by mouth every 6 (six) hours as needed for mild pain. , Disp: , Rfl:  .  ARIPiprazole (ABILIFY) 2 MG tablet, TAKE 1 TABLET (2 MG TOTAL) BY MOUTH EVERY EVENING., Disp: 90 tablet, Rfl: 1 .  aspirin 81 MG tablet, Take 81 mg by mouth daily., Disp: , Rfl:  .  atorvastatin (LIPITOR) 20 MG tablet, TAKE 1 TABLET BY MOUTH EVERY DAY, Disp: 90 tablet, Rfl: 1 .  bismuth subsalicylate (PEPTO BISMOL) 262 MG chewable tablet, Chew 524 mg by mouth as needed., Disp: , Rfl:  .  calcium carbonate (TUMS SMOOTHIES) 750 MG chewable tablet, Chew 1 tablet by mouth 3 (three) times daily as needed for heartburn., Disp: , Rfl:  .  clindamycin (CLEOCIN) 150 MG capsule, Take 600 mg by mouth once. PRIOR TO DENTAL APPOINTMENT, Disp: , Rfl:  .  famotidine (PEPCID) 20 MG tablet, Take 20 mg by mouth daily as needed for heartburn or indigestion., Disp: , Rfl:  .  lisinopril (PRINIVIL,ZESTRIL) 5 MG tablet, Take 1 tablet (5 mg total) by mouth daily., Disp: 90 tablet, Rfl: 1 .  Multiple Vitamins-Minerals ([employer] SPECTRAVITE PO), Take 1 tablet by mouth daily. Centrum silver, Disp: , Rfl:  .  oxybutynin (DITROPAN-XL) 10 MG 24 hr tablet, TAKE  1 TABLET BY MOUTH EVERYDAY AT BEDTIME, Disp: 90 tablet, Rfl: 1 .  pantoprazole (PROTONIX) 40 MG tablet, TAKE 1 TABLET BY MOUTH EVERY DAY, Disp: 90 tablet, Rfl: 1 .  Venlafaxine HCl 225 MG TB24, Take 1 tablet (225 mg total) by mouth daily., Disp: 90 each, Rfl: 2  Review of Systems  Social History   Tobacco Use  . Smoking status: Former Smoker    Types: Cigarettes  . Smokeless tobacco: Never Used  . Tobacco comment: smoked in college  Substance Use Topics  . Alcohol use: Yes    Alcohol/week: 4.2 - 8.4 oz    Types: 7 - 14 Glasses of wine per week    Comment: 1-2 glasses of wine per night   Objective:   BP 110/78 (BP Location: Left Arm, Patient Position: Sitting, Cuff Size: Normal)    Pulse 79   Temp 98.2 F (36.8 C) (Oral)   Wt 181 lb 12.8 oz (82.5 kg)   SpO2 97%   BMI 30.25 kg/m    Physical Exam  Constitutional: She is oriented to person, place, and time. She appears well-developed and well-nourished.  Cardiovascular: Normal rate and regular rhythm.  Pulses:      Dorsalis pedis pulses are 2+ on the right side, and 2+ on the left side.       Posterior tibial pulses are 2+ on the right side, and 2+ on the left side.  Pulmonary/Chest: Effort normal and breath sounds normal.  Musculoskeletal:       Right foot: There is no deformity.       Left foot: There is no deformity.  Feet:  Right Foot:  Skin Integrity: Positive for erythema and warmth. Negative for ulcer, blister or skin breakdown.  Left Foot:  Skin Integrity: Negative for ulcer, blister, skin breakdown, erythema or warmth.  Neurological: She is alert and oriented to person, place, and time.  Skin: Skin is warm and dry. There is erythema.  Erythema and warmth of first MTP on right foot. There is mild generalized swelling over the joint. There is tenderness to deep palpation, but not to light touch. Patient is wearing ballet flat crocs that closely confine the foot without issue.   Psychiatric: She has a normal mood and affect. Her behavior is normal.        Assessment & Plan:     1. Swelling of first metatarsophalangeal (MTP) joint of right foot  Bursitis vs. Cellulitis vs. Gout. Do not suspect gout due to slow onset, patient's ability to wear confining footwear. More suspicious of bursitis/localized joint inflammation due to constrictive footwear. She is diabetic but do not see any ulcers or skin breakdown, less suspicious of infection due to very focal tenderness over MTP. Will have her get xray of the right foot to further assess, will send in 40 mg prednisone x 5 days.   - DG Foot Complete Right; Future  2. Type 2 diabetes mellitus without complication, without long-term current use of insulin  (HCC)  Well controlled, short burst of steroid.   3. Essential hypertension  Well controlled, not on diuretic.   Return in about 1 day (around 05/16/2017), or 2, for foot pain .  The entirety of the information documented in the History of Present Illness, Review of Systems and Physical Exam were personally obtained by me. Portions of this information were initially documented by Tiburcio Pea, CMA and reviewed by me for thoroughness and accuracy.  Trinna Post, PA-C  [facility]

## 2017-05-15 NOTE — Patient Instructions (Addendum)
Naproxen 250 mg twice a day for five days    Bursitis Bursitis is when the fluid-filled sac (bursa) that covers and protects a joint is swollen (inflamed). Bursitis is most common near joints, especially the knees, elbows, hips, and shoulders. Follow these instructions at home:  Take medicines only as told by your doctor.  If you were prescribed an antibiotic medicine, finish it all even if you start to feel better.  Rest the affected area as told by your doctor. ? Keep the area raised up. ? Avoid doing things that make the pain worse.  Apply ice to the injured area: ? Place ice in a plastic bag. ? Place a towel between your skin and the bag. ? Leave the ice on for 20 minutes, 2-3 times a day.  Use splints, braces, pads, or walking aids as told by your doctor.  Keep all follow-up visits as told by your doctor. This is important. Contact a doctor if:  You have more pain with home care.  You have a fever.  You have chills. This information is not intended to replace advice given to you by your health care provider. Make sure you discuss any questions you have with your health care provider. Document Released: 06/21/2009 Document Revised: 06/09/2015 Document Reviewed: 03/23/2013 Elsevier Interactive Patient Education  2018 [employer].

## 2017-05-16 MED ORDER — PREDNISONE 20 MG PO TABS: 40.0000 mg | ORAL_TABLET | Freq: Every day | ORAL | 0 refills | Status: AC

## 2017-05-16 NOTE — Addendum Note (Signed)
Addended by: Trinna Post on: 05/16/2017 04:50 PM   Modules accepted: Orders

## 2017-05-17 ENCOUNTER — Ambulatory Visit: Payer: Self-pay | Admitting: Plastic Surgery

## 2017-05-17 ENCOUNTER — Ambulatory Visit: Payer: Medicare Other | Admitting: Family Medicine

## 2017-05-20 ENCOUNTER — Other Ambulatory Visit: Payer: Self-pay | Admitting: General Surgery

## 2017-05-20 DIAGNOSIS — D0511 Intraductal carcinoma in situ of right breast: Secondary | ICD-10-CM

## 2017-05-24 ENCOUNTER — Other Ambulatory Visit: Payer: Self-pay

## 2017-05-24 ENCOUNTER — Encounter
Admission: RE | Admit: 2017-05-24 | Discharge: 2017-05-24 | Disposition: A | Discharge status: Home / Self Care | Payer: Medicare Other | Source: Ambulatory Visit | Attending: General Surgery | Admitting: General Surgery

## 2017-05-24 DIAGNOSIS — I10 Essential (primary) hypertension: Secondary | ICD-10-CM | POA: Insufficient documentation

## 2017-05-24 DIAGNOSIS — Z01810 Encounter for preprocedural cardiovascular examination: Secondary | ICD-10-CM | POA: Insufficient documentation

## 2017-05-24 HISTORY — DX: Other complications of anesthesia, initial encounter: T88.59XA

## 2017-05-24 HISTORY — DX: Adverse effect of unspecified anesthetic, initial encounter: T41.45XA

## 2017-05-24 NOTE — Patient Instructions (Signed)
Your procedure is scheduled on: Monday Jun 03, 2017 Report to [place]:  Instructions that are not followed completely may result in serious medical risk, up to and including death; or upon the discretion of your surgeon and anesthesiologist your surgery may need to be rescheduled.  Do not eat food after midnight the night before your procedure.  No gum chewing, lozengers or hard candies.  You may however, drink CLEAR liquids up to 2 hours before you are scheduled to arrive for your surgery. Do not drink anything within 2 hours of the start of your surgery.  Clear liquids include: - water   Do NOT drink anything that is not on this list.  Type 1 and Type 2 diabetics should only drink water.  No Alcohol for 24 hours before or after surgery.  No Smoking including e-cigarettes for 24 hours prior to surgery.  No chewable tobacco products for at least 6 hours prior to surgery.  No nicotine patches on the day of surgery.  On the morning of surgery brush your teeth with toothpaste and water, you may rinse your mouth with mouthwash if you wish. Do not swallow any toothpaste or mouthwash.  Notify your doctor if there is any change in your medical condition (cold, fever, infection).  Do not wear jewelry, make-up, hairpins, clips or nail polish.  Do not wear lotions, powders, or perfumes. You may NOT wear deodorant.  Do not shave 48 hours prior to surgery. Men may shave face and neck.  Contacts and dentures may not be worn into surgery.  Do not bring valuables to the hospital, including drivers license, insurance or credit cards.  [facility] is not responsible for any belongings or valuables.     TAKE THESE MEDICATIONS THE MORNING OF SURGERY:  ATORVASTATIN- LIPTOR EFFEXOR OMEPRAZOLE   Use CHG Soap  as directed on instruction sheet.   Follow recommendations from Cardiologist, Pulmonologist or PCP regarding stopping Aspirin   Stop  Anti-inflammatories (NSAIDS) such as Advil, Aleve, Ibuprofen, Motrin, Naproxen, Naprosyn and Aspirin based products such as Excedrin, Goodys Powder, BC Powder. (May take Tylenol or Acetaminophen if needed.)  Stop ANY OVER THE COUNTER supplements until after surgery. (May continue Vitamin D, Vitamin B, and multivitamin.)  Wear comfortable clothing (specific to your surgery type) to the hospital.  Plan for stool softeners for home use.  If you are being admitted to the hospital overnight, leave your suitcase in the car. After surgery it may be brought to your room.  If you are being discharged the day of surgery, you will not be allowed to drive home. You will need a responsible adult to drive you home and stay with you that night.   If you are taking public transportation, you will need to have a responsible adult with you. Please confirm with your physician that it is acceptable to use public transportation.   Please call [phone] if you have any questions about these instructions.

## 2017-06-01 ENCOUNTER — Ambulatory Visit: Payer: Self-pay | Admitting: Plastic Surgery

## 2017-06-01 DIAGNOSIS — C50911 Malignant neoplasm of unspecified site of right female breast: Secondary | ICD-10-CM

## 2017-06-01 NOTE — H&P (Signed)
Natasha Vasquez is an 77 y.o. female.   Chief Complaint: breast cancer HPI: The patient is a 77 y.o. yrs old wf here with her husband for pre operative history and physical prior to right breast reconstruction.  History:  She was diagnosed with right breast cancer.  Her mammogram was done 04/02/17 and underwent a biopsy on 04/25/17 and 05/03/17.  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  She is 5 feet 5 inches tall, weight is 178 pounds.  Preop bra is 38 C/D.  The biopsy site is healing well with mild bruising.  She has grade II ptosis with the left breast slightly larger than the right. She is interested in a right mastectomy and reconstruction. She would like to consider left reduction/lift for symmetry at the time of exchange surgery.  She has very reasonable expectations.  Past Medical History:  Diagnosis Date  . Anemia in pregnancy   . Arthritis    hands  . Barrett esophagus   . Cancer ([place])    melanoma  . Complication of anesthesia    20 years ago pts b/p dropped really low  . Depression   . Depression   . Diabetes mellitus without complication (HCC)    diet controlled  . Family history of adverse reaction to anesthesia    son PONV  . GERD (gastroesophageal reflux disease)   . Hypercholesteremia   . Hyperlipidemia   . Hypertension   . MVP (mitral valve prolapse)    followed by PCP  . Nephritis   . Nephritis   . S/P appendectomy   . Torn meniscus    left    Past Surgical History:  Procedure Laterality Date  . ABDOMINAL HYSTERECTOMY    . APPENDECTOMY    . BREAST BIOPSY Right 04/25/2017   retroareolar 10:00   wing clip   path pending  . BREAST BIOPSY Right 04/25/2017   10:00  5CMFN  venus clip    path pending  . BREAST BIOPSY Right 05/03/2017   Affirm Bx- path pending  . BREAST CYST ASPIRATION Bilateral    NEG  . BREAST EXCISIONAL BIOPSY Left 20+ yrs ago   NEG  . BROW LIFT Bilateral 02/01/2015   Procedure: BLEPHAROPLASTY bilateral  upper eyelids.;  Surgeon: Karle Starch, MD;  Location: [place];  Service: Ophthalmology;  Laterality: Bilateral;  DIABETIC - diet controlled  . CHOLECYSTECTOMY    . COLONOSCOPY    . COLONOSCOPY WITH PROPOFOL N/A 05/11/2015   Procedure: COLONOSCOPY WITH PROPOFOL;  Surgeon: Manya Silvas, MD;  Location: [facility] ENDOSCOPY;  Service: Endoscopy;  Laterality: N/A;  . ESOPHAGOGASTRODUODENOSCOPY    . ESOPHAGOGASTRODUODENOSCOPY (EGD) WITH PROPOFOL  05/11/2015   Procedure: ESOPHAGOGASTRODUODENOSCOPY (EGD) WITH PROPOFOL;  Surgeon: Manya Silvas, MD;  Location: [facility] ENDOSCOPY;  Service: Endoscopy;;  . FRACTURE SURGERY Right    arm and shoulder  . JOINT REPLACEMENT    . KNEE ARTHROSCOPY Left 03/14/2016   Procedure: ARTHROSCOPY KNEE, PARTIAL MEDIAL MENISECTOMY, CHONDROPLASTY;  Surgeon: Dereck Leep, MD;  Location: [facility] ORS;  Service: Orthopedics;  Laterality: Left;  . PARTIAL HIP ARTHROPLASTY Right   . TONSILLECTOMY      Family History  Problem Relation Age of Onset  . Cancer Mother        colon/rectal  . Colon cancer Mother   . Heart disease Father   . Hypertension Father   . Atrial fibrillation Father   . Diabetes Father   . Cancer Brother  lung  . Healthy Son   . Healthy Son   . Breast cancer Neg Hx    Social History:  reports that she has quit smoking. Her smoking use included cigarettes. She has never used smokeless tobacco. She reports that she drinks about 4.2 - 8.4 oz of alcohol per week. She reports that she does not use drugs.  Allergies:  Allergies  Allergen Reactions  . Codeine Other (See Comments)    Chest pain  . Opium Other (See Comments)    Chest pain---tolerated MORPHINE  . Oxycodone Other (See Comments)    MENTAL CHANGES  . Penicillins     Has patient had a PCN reaction causing immediate rash, facial/tongue/throat swelling, SOB or lightheadedness with hypotension: Yes Has patient had a PCN reaction causing severe rash involving mucus membranes or  skin necrosis: No Has patient had a PCN reaction that required hospitalization: NO Has patient had a PCN reaction occurring within the last 10 years: No If all of the above answers are "NO", then may proceed with Cephalosporin use.  Can take Augmentin  . Aleve [Naproxen Sodium] Hives, Rash and Other (See Comments)    headaches  . Naproxen Hives and Rash    And headaches    No medications prior to admission.    No results found for this or any previous visit (from the past 48 hour(s)). No results found.  Review of Systems  Constitutional: Negative.   HENT: Negative.   Eyes: Negative.   Respiratory: Negative.   Cardiovascular: Negative.   Gastrointestinal: Negative.   Genitourinary: Negative.   Musculoskeletal: Negative.   Skin: Negative.   Neurological: Negative.   Psychiatric/Behavioral: Negative.     There were no vitals taken for this visit. Physical Exam  Constitutional: She is oriented to person, place, and time. She appears well-developed and well-nourished.  HENT:  Head: Normocephalic and atraumatic.  Eyes: Pupils are equal, round, and reactive to light. Conjunctivae and EOM are normal.  Cardiovascular: Normal rate.  Respiratory: Effort normal.  GI: Soft. She exhibits no distension.  Neurological: She is alert and oriented to person, place, and time.  Skin: Skin is warm.  Psychiatric: She has a normal mood and affect. Her behavior is normal. Judgment and thought content normal.    Assessment/Plan Plan immediate right breast reconstruction with placement of TE/ADM.   [place], DO 06/01/2017, 5:16 PM

## 2017-06-02 MED ORDER — CIPROFLOXACIN IN D5W 400 MG/200ML IV SOLN
400.0000 mg | INTRAVENOUS | Status: AC
  Administered 2017-06-03: 400 mg via INTRAVENOUS

## 2017-06-03 ENCOUNTER — Encounter
Admission: RE | Disposition: A | Discharge status: Home / Self Care | Payer: Self-pay | Source: Ambulatory Visit | Attending: General Surgery

## 2017-06-03 ENCOUNTER — Inpatient Hospital Stay: Payer: Medicare Other | Admitting: Registered Nurse

## 2017-06-03 ENCOUNTER — Observation Stay
Admission: RE | Admit: 2017-06-03 | Discharge: 2017-06-04 | Disposition: A | Discharge status: Home / Self Care | Payer: Medicare Other | Source: Ambulatory Visit | Attending: General Surgery | Admitting: General Surgery

## 2017-06-03 ENCOUNTER — Observation Stay
Admission: RE | Admit: 2017-06-03 | Discharge: 2017-06-03 | Disposition: A | Discharge status: Home / Self Care | Payer: Medicare Other | Source: Ambulatory Visit | Attending: General Surgery | Admitting: General Surgery

## 2017-06-03 ENCOUNTER — Encounter: Payer: Self-pay | Admitting: *Deleted

## 2017-06-03 ENCOUNTER — Other Ambulatory Visit: Payer: Self-pay

## 2017-06-03 DIAGNOSIS — C50911 Malignant neoplasm of unspecified site of right female breast: Secondary | ICD-10-CM

## 2017-06-03 DIAGNOSIS — Z96641 Presence of right artificial hip joint: Secondary | ICD-10-CM | POA: Diagnosis not present

## 2017-06-03 DIAGNOSIS — I10 Essential (primary) hypertension: Secondary | ICD-10-CM | POA: Diagnosis not present

## 2017-06-03 DIAGNOSIS — Z800 Family history of malignant neoplasm of digestive organs: Secondary | ICD-10-CM | POA: Diagnosis not present

## 2017-06-03 DIAGNOSIS — Z87891 Personal history of nicotine dependence: Secondary | ICD-10-CM | POA: Diagnosis not present

## 2017-06-03 DIAGNOSIS — Z85820 Personal history of malignant melanoma of skin: Secondary | ICD-10-CM | POA: Diagnosis not present

## 2017-06-03 DIAGNOSIS — D0511 Intraductal carcinoma in situ of right breast: Secondary | ICD-10-CM

## 2017-06-03 DIAGNOSIS — Z801 Family history of malignant neoplasm of trachea, bronchus and lung: Secondary | ICD-10-CM | POA: Diagnosis not present

## 2017-06-03 DIAGNOSIS — D0510 Intraductal carcinoma in situ of unspecified breast: Secondary | ICD-10-CM | POA: Diagnosis present

## 2017-06-03 DIAGNOSIS — C50111 Malignant neoplasm of central portion of right female breast: Secondary | ICD-10-CM | POA: Diagnosis not present

## 2017-06-03 DIAGNOSIS — E119 Type 2 diabetes mellitus without complications: Secondary | ICD-10-CM | POA: Insufficient documentation

## 2017-06-03 HISTORY — DX: Intraductal carcinoma in situ of right breast: D05.11

## 2017-06-03 HISTORY — PX: MASTECTOMY W/ SENTINEL NODE BIOPSY: SHX2001

## 2017-06-03 HISTORY — PX: BREAST RECONSTRUCTION WITH PLACEMENT OF TISSUE EXPANDER AND FLEX HD (ACELLULAR HYDRATED DERMIS): SHX6295

## 2017-06-03 LAB — GLUCOSE, CAPILLARY
Glucose-Capillary: 111 mg/dL — ABNORMAL HIGH (ref 65–99)
Glucose-Capillary: 136 mg/dL — ABNORMAL HIGH (ref 65–99)

## 2017-06-03 SURGERY — MASTECTOMY WITH SENTINEL LYMPH NODE BIOPSY
Anesthesia: General | Laterality: Right | Wound class: Clean

## 2017-06-03 MED ORDER — ONDANSETRON HCL 4 MG/2ML IJ SOLN
4.0000 mg | Freq: Once | INTRAMUSCULAR | Status: DC | PRN
Start: 2017-06-03 — End: 2017-06-03

## 2017-06-03 MED ORDER — FAMOTIDINE 20 MG PO TABS
20.0000 mg | ORAL_TABLET | Freq: Every day | ORAL | Status: DC
  Administered 2017-06-03: 20 mg via ORAL
  Filled 2017-06-03: qty 1

## 2017-06-03 MED ORDER — NEOMYCIN-POLYMYXIN B GU 40-200000 IR SOLN
Status: DC | PRN
  Administered 2017-06-03: 4 mL

## 2017-06-03 MED ORDER — NEOMYCIN-POLYMYXIN B GU 40-200000 IR SOLN
Status: AC
  Filled 2017-06-03: qty 4

## 2017-06-03 MED ORDER — ACETAMINOPHEN 10 MG/ML IV SOLN
INTRAVENOUS | Status: AC
  Filled 2017-06-03: qty 100

## 2017-06-03 MED ORDER — SODIUM CHLORIDE 0.9 % IV SOLN
INTRAVENOUS | Status: DC
  Administered 2017-06-03: 12:00:00 via INTRAVENOUS

## 2017-06-03 MED ORDER — ARIPIPRAZOLE 2 MG PO TABS
2.0000 mg | ORAL_TABLET | Freq: Every evening | ORAL | Status: DC
  Administered 2017-06-03: 2 mg via ORAL
  Filled 2017-06-03 (×2): qty 1

## 2017-06-03 MED ORDER — CIPROFLOXACIN IN D5W 400 MG/200ML IV SOLN
INTRAVENOUS | Status: AC
  Filled 2017-06-03: qty 200

## 2017-06-03 MED ORDER — OXYBUTYNIN CHLORIDE ER 5 MG PO TB24
5.0000 mg | ORAL_TABLET | Freq: Every day | ORAL | Status: DC
  Administered 2017-06-03: 5 mg via ORAL
  Filled 2017-06-03: qty 1

## 2017-06-03 MED ORDER — LIDOCAINE HCL (CARDIAC) PF 100 MG/5ML IV SOSY
PREFILLED_SYRINGE | INTRAVENOUS | Status: DC | PRN
  Administered 2017-06-03: 100 mg via INTRAVENOUS

## 2017-06-03 MED ORDER — ROCURONIUM BROMIDE 100 MG/10ML IV SOLN
INTRAVENOUS | Status: DC | PRN
  Administered 2017-06-03: 20 mg via INTRAVENOUS
  Administered 2017-06-03: 50 mg via INTRAVENOUS

## 2017-06-03 MED ORDER — TECHNETIUM TC 99M SULFUR COLLOID FILTERED
0.7300 | Freq: Once | INTRAVENOUS | Status: AC | PRN
  Administered 2017-06-03: 0.73 via INTRADERMAL

## 2017-06-03 MED ORDER — ONDANSETRON HCL 4 MG/2ML IJ SOLN
INTRAMUSCULAR | Status: AC
  Filled 2017-06-03: qty 2

## 2017-06-03 MED ORDER — ACETAMINOPHEN 10 MG/ML IV SOLN
1000.0000 mg | Freq: Four times a day (QID) | INTRAVENOUS | Status: DC
  Administered 2017-06-03 – 2017-06-04 (×3): 1000 mg via INTRAVENOUS
  Filled 2017-06-03 (×6): qty 100

## 2017-06-03 MED ORDER — ADULT MULTIVITAMIN W/MINERALS CH
1.0000 | ORAL_TABLET | Freq: Every day | ORAL | Status: DC
  Administered 2017-06-03: 1 via ORAL
  Filled 2017-06-03 (×2): qty 1

## 2017-06-03 MED ORDER — FENTANYL CITRATE (PF) 100 MCG/2ML IJ SOLN
25.0000 ug | INTRAMUSCULAR | Status: AC | PRN
  Administered 2017-06-03 (×6): 25 ug via INTRAVENOUS

## 2017-06-03 MED ORDER — VENLAFAXINE HCL ER 75 MG PO CP24
225.0000 mg | ORAL_CAPSULE | Freq: Every day | ORAL | Status: DC
  Administered 2017-06-04: 225 mg via ORAL
  Filled 2017-06-03: qty 3

## 2017-06-03 MED ORDER — PROPOFOL 10 MG/ML IV BOLUS
INTRAVENOUS | Status: DC | PRN
  Administered 2017-06-03: 150 mg via INTRAVENOUS

## 2017-06-03 MED ORDER — FENTANYL CITRATE (PF) 100 MCG/2ML IJ SOLN
25.0000 ug | INTRAMUSCULAR | Status: AC | PRN
  Administered 2017-06-03 (×2): 25 ug via INTRAVENOUS

## 2017-06-03 MED ORDER — ROCURONIUM BROMIDE 50 MG/5ML IV SOLN
INTRAVENOUS | Status: AC
  Filled 2017-06-03: qty 1

## 2017-06-03 MED ORDER — SUGAMMADEX SODIUM 200 MG/2ML IV SOLN
INTRAVENOUS | Status: AC
  Filled 2017-06-03: qty 2

## 2017-06-03 MED ORDER — EPHEDRINE SULFATE 50 MG/ML IJ SOLN
INTRAMUSCULAR | Status: DC | PRN
  Administered 2017-06-03: 10 mg via INTRAVENOUS
  Administered 2017-06-03: 5 mg via INTRAVENOUS

## 2017-06-03 MED ORDER — CIPROFLOXACIN HCL 500 MG PO TABS
500.0000 mg | ORAL_TABLET | Freq: Two times a day (BID) | ORAL | Status: DC
  Administered 2017-06-03 – 2017-06-04 (×2): 500 mg via ORAL
  Filled 2017-06-03 (×2): qty 1

## 2017-06-03 MED ORDER — METHYLENE BLUE 0.5 % INJ SOLN
INTRAVENOUS | Status: AC
  Filled 2017-06-03: qty 10

## 2017-06-03 MED ORDER — LIDOCAINE HCL (PF) 2 % IJ SOLN
INTRAMUSCULAR | Status: AC
  Filled 2017-06-03: qty 10

## 2017-06-03 MED ORDER — CEFAZOLIN SODIUM-DEXTROSE 2-4 GM/100ML-% IV SOLN
INTRAVENOUS | Status: AC
  Filled 2017-06-03: qty 100

## 2017-06-03 MED ORDER — FENTANYL CITRATE (PF) 100 MCG/2ML IJ SOLN
INTRAMUSCULAR | Status: AC
  Administered 2017-06-03: 25 ug via INTRAVENOUS
  Filled 2017-06-03: qty 2

## 2017-06-03 MED ORDER — GLYCOPYRROLATE 0.2 MG/ML IJ SOLN
INTRAMUSCULAR | Status: AC
  Filled 2017-06-03: qty 1

## 2017-06-03 MED ORDER — DEXAMETHASONE SODIUM PHOSPHATE 10 MG/ML IJ SOLN
INTRAMUSCULAR | Status: AC
  Filled 2017-06-03: qty 1

## 2017-06-03 MED ORDER — PROPOFOL 10 MG/ML IV BOLUS
INTRAVENOUS | Status: AC
  Filled 2017-06-03: qty 20

## 2017-06-03 MED ORDER — PHENYLEPHRINE HCL 10 MG/ML IJ SOLN
INTRAMUSCULAR | Status: DC | PRN
  Administered 2017-06-03: 100 ug via INTRAVENOUS
  Administered 2017-06-03: 200 ug via INTRAVENOUS

## 2017-06-03 MED ORDER — PANTOPRAZOLE SODIUM 40 MG PO TBEC
40.0000 mg | DELAYED_RELEASE_TABLET | Freq: Every day | ORAL | Status: DC
  Administered 2017-06-03: 40 mg via ORAL
  Filled 2017-06-03 (×2): qty 1

## 2017-06-03 MED ORDER — BUPIVACAINE-EPINEPHRINE (PF) 0.25% -1:200000 IJ SOLN
INTRAMUSCULAR | Status: AC
Start: 2017-06-03 — End: ?
  Filled 2017-06-03: qty 30

## 2017-06-03 MED ORDER — CALCIUM CARBONATE ANTACID 500 MG PO CHEW: 1.5000 | CHEWABLE_TABLET | Freq: Three times a day (TID) | ORAL | Status: DC | PRN

## 2017-06-03 MED ORDER — SUGAMMADEX SODIUM 200 MG/2ML IV SOLN
INTRAVENOUS | Status: DC | PRN
  Administered 2017-06-03: 160 mg via INTRAVENOUS

## 2017-06-03 MED ORDER — ATORVASTATIN CALCIUM 20 MG PO TABS
20.0000 mg | ORAL_TABLET | Freq: Every day | ORAL | Status: DC
  Filled 2017-06-03: qty 1

## 2017-06-03 MED ORDER — METHYLENE BLUE 0.5 % INJ SOLN
INTRAVENOUS | Status: DC | PRN
  Administered 2017-06-03: 5 mL via SUBMUCOSAL

## 2017-06-03 MED ORDER — CEFAZOLIN SODIUM-DEXTROSE 2-4 GM/100ML-% IV SOLN: 2.0000 g | INTRAVENOUS | Status: DC

## 2017-06-03 MED ORDER — LISINOPRIL 10 MG PO TABS
5.0000 mg | ORAL_TABLET | Freq: Every day | ORAL | Status: DC
Start: 2017-06-03 — End: 2017-06-04
  Administered 2017-06-03: 5 mg via ORAL
  Filled 2017-06-03 (×2): qty 1

## 2017-06-03 MED ORDER — BISMUTH SUBSALICYLATE 262 MG PO CHEW
524.0000 mg | CHEWABLE_TABLET | Freq: Three times a day (TID) | ORAL | Status: DC | PRN
Start: 2017-06-03 — End: 2017-06-04
  Filled 2017-06-03: qty 2

## 2017-06-03 MED ORDER — ONDANSETRON HCL 4 MG/2ML IJ SOLN
INTRAMUSCULAR | Status: DC | PRN
Start: 2017-06-03 — End: 2017-06-03
  Administered 2017-06-03: 4 mg via INTRAVENOUS

## 2017-06-03 MED ORDER — TRAMADOL HCL 50 MG PO TABS
50.0000 mg | ORAL_TABLET | ORAL | Status: DC | PRN
  Administered 2017-06-03 – 2017-06-04 (×3): 50 mg via ORAL
  Filled 2017-06-03 (×3): qty 1

## 2017-06-03 MED ORDER — ASPIRIN EC 81 MG PO TBEC
81.0000 mg | DELAYED_RELEASE_TABLET | Freq: Every day | ORAL | Status: DC
  Filled 2017-06-03: qty 1

## 2017-06-03 MED ORDER — DEXAMETHASONE SODIUM PHOSPHATE 10 MG/ML IJ SOLN
INTRAMUSCULAR | Status: DC | PRN
  Administered 2017-06-03: 10 mg via INTRAVENOUS

## 2017-06-03 MED ORDER — FENTANYL CITRATE (PF) 100 MCG/2ML IJ SOLN
INTRAMUSCULAR | Status: AC
  Filled 2017-06-03: qty 2

## 2017-06-03 MED ORDER — ACETAMINOPHEN 10 MG/ML IV SOLN
INTRAVENOUS | Status: DC | PRN
  Administered 2017-06-03: 1000 mg via INTRAVENOUS

## 2017-06-03 MED ORDER — FENTANYL CITRATE (PF) 100 MCG/2ML IJ SOLN
INTRAMUSCULAR | Status: DC | PRN
  Administered 2017-06-03: 100 ug via INTRAVENOUS

## 2017-06-03 SURGICAL SUPPLY — 93 items
APPLIER CLIP 11 MED OPEN (CLIP)
APPLIER CLIP 13 LRG OPEN (CLIP)
BAG DECANTER FOR FLEXI CONT (MISCELLANEOUS) IMPLANT
BANDAGE ELASTIC 6 LF NS (GAUZE/BANDAGES/DRESSINGS) IMPLANT
BINDER BREAST LRG (GAUZE/BANDAGES/DRESSINGS) ×3 IMPLANT
BINDER BREAST MEDIUM (GAUZE/BANDAGES/DRESSINGS) IMPLANT
BINDER BREAST XLRG (GAUZE/BANDAGES/DRESSINGS) IMPLANT
BINDER BREAST XXLRG (GAUZE/BANDAGES/DRESSINGS) IMPLANT
BLADE BOVIE TIP EXT 4 (BLADE) IMPLANT
BLADE PHOTON ILLUMINATED (MISCELLANEOUS) ×3 IMPLANT
BLADE SURG 15 STRL LF DISP TIS (BLADE) ×1 IMPLANT
BLADE SURG 15 STRL SS (BLADE) ×2
BLADE SURG 15 STRL SS SAFETY (BLADE) ×3 IMPLANT
BNDG GAUZE 4.5X4.1 6PLY STRL (MISCELLANEOUS) IMPLANT
BULB RESERV EVAC DRAIN JP 100C (MISCELLANEOUS) IMPLANT
CANISTER SUCT 1200ML W/VALVE (MISCELLANEOUS) ×3 IMPLANT
CHLORAPREP W/TINT 26ML (MISCELLANEOUS) ×3 IMPLANT
CLIP APPLIE 11 MED OPEN (CLIP) IMPLANT
CLIP APPLIE 13 LRG OPEN (CLIP) IMPLANT
CLOSURE WOUND 1/2 X4 (GAUZE/BANDAGES/DRESSINGS)
CNTNR SPEC 2.5X3XGRAD LEK (MISCELLANEOUS)
CONT SPEC 4OZ STER OR WHT (MISCELLANEOUS)
CONTAINER SPEC 2.5X3XGRAD LEK (MISCELLANEOUS) IMPLANT
DECANTER SPIKE VIAL GLASS SM (MISCELLANEOUS) IMPLANT
DERMABOND ADVANCED (GAUZE/BANDAGES/DRESSINGS)
DERMABOND ADVANCED .7 DNX12 (GAUZE/BANDAGES/DRESSINGS) IMPLANT
DEVICE DUBIN SPECIMEN MAMMOGRA (MISCELLANEOUS) ×3 IMPLANT
DRAIN CHANNEL 19F RND (DRAIN) IMPLANT
DRAIN CHANNEL JP 15F RND 16 (MISCELLANEOUS) IMPLANT
DRAPE LAPAROTOMY TRNSV 106X77 (MISCELLANEOUS) ×3 IMPLANT
DRAPE UTILITY 15X26 TOWEL STRL (DRAPES) ×6 IMPLANT
DRSG GAUZE FLUFF 36X18 (GAUZE/BANDAGES/DRESSINGS) IMPLANT
DRSG TELFA 3X8 NADH (GAUZE/BANDAGES/DRESSINGS) IMPLANT
ELECT CAUTERY BLADE 6.4 (BLADE) ×3 IMPLANT
ELECT CAUTERY BLADE TIP 2.5 (TIP) ×3
ELECT REM PT RETURN 9FT ADLT (ELECTROSURGICAL) ×3
ELECTRODE CAUTERY BLDE TIP 2.5 (TIP) ×1 IMPLANT
ELECTRODE REM PT RTRN 9FT ADLT (ELECTROSURGICAL) ×1 IMPLANT
GAUZE SPONGE 4X4 12PLY STRL (GAUZE/BANDAGES/DRESSINGS) IMPLANT
GLOVE BIO SURGEON STRL SZ 6.5 (GLOVE) ×4 IMPLANT
GLOVE BIO SURGEON STRL SZ7.5 (GLOVE) ×9 IMPLANT
GLOVE BIO SURGEONS STRL SZ 6.5 (GLOVE) ×2
GLOVE INDICATOR 8.0 STRL GRN (GLOVE) ×9 IMPLANT
GOWN STRL REUS W/ TWL LRG LVL3 (GOWN DISPOSABLE) ×4 IMPLANT
GOWN STRL REUS W/TWL LRG LVL3 (GOWN DISPOSABLE) ×8
GRAFT FLEX HD 4X16 THICK (Tissue Mesh) ×3 IMPLANT
IMPL EXPANDER BREAST 535CC (Breast) ×1 IMPLANT
IMPLANT BREAST 535CC (Breast) ×2 IMPLANT
IMPLANT EXPANDER BREAST 535CC (Breast) ×1 IMPLANT
IV NS 1000ML (IV SOLUTION) ×2
IV NS 1000ML BAXH (IV SOLUTION) ×1 IMPLANT
IV NS 500ML (IV SOLUTION) ×2
IV NS 500ML BAXH (IV SOLUTION) ×1 IMPLANT
LABEL OR SOLS (LABEL) ×3 IMPLANT
NEEDLE 21 GA WING INFUSION (NEEDLE) ×3 IMPLANT
NEEDLE HYPO 25X1 1.5 SAFETY (NEEDLE) IMPLANT
PACK BASIN MAJOR ARMC (MISCELLANEOUS) ×3 IMPLANT
PACK BASIN MINOR ARMC (MISCELLANEOUS) ×3 IMPLANT
PACK UNIVERSAL (MISCELLANEOUS) IMPLANT
PAD ABD DERMACEA PRESS 5X9 (GAUZE/BANDAGES/DRESSINGS) IMPLANT
PAD PREP 24X41 OB/GYN DISP (PERSONAL CARE ITEMS) IMPLANT
PIN SAFETY STRL (MISCELLANEOUS) IMPLANT
RETRACTOR RING XSMALL (MISCELLANEOUS) ×1 IMPLANT
RTRCTR WOUND ALEXIS 13CM XS SH (MISCELLANEOUS) ×3
SET ASEPTIC TRANSFER (MISCELLANEOUS) ×3 IMPLANT
SHEARS FOC LG CVD HARMONIC 17C (MISCELLANEOUS) IMPLANT
SLEVE PROBE SENORX GAMMA FIND (MISCELLANEOUS) IMPLANT
SPONGE LAP 18X18 5 PK (GAUZE/BANDAGES/DRESSINGS) ×12 IMPLANT
STRIP CLOSURE SKIN 1/2X4 (GAUZE/BANDAGES/DRESSINGS) IMPLANT
SUT ETHILON 3-0 FS-10 30 BLK (SUTURE) ×3
SUT MNCRL 3-0 UNDYED SH (SUTURE) ×1 IMPLANT
SUT MNCRL AB 4-0 PS2 18 (SUTURE) IMPLANT
SUT MNCRL+ 5-0 UNDYED PC-3 (SUTURE) ×2 IMPLANT
SUT MONOCRYL 3-0 UNDYED (SUTURE) ×2
SUT MONOCRYL 5-0 (SUTURE) ×4
SUT PDS 2-0 27IN (SUTURE) ×9 IMPLANT
SUT PDS AB 1 CT1 27 (SUTURE) ×3 IMPLANT
SUT SILK 0 (SUTURE)
SUT SILK 0 30XBRD TIE 6 (SUTURE) IMPLANT
SUT SILK 3 0 (SUTURE) ×2
SUT SILK 3 0 SH 30 (SUTURE) ×3 IMPLANT
SUT SILK 3-0 18XBRD TIE 12 (SUTURE) ×1 IMPLANT
SUT VIC AB 2-0 CT1 27 (SUTURE) ×6
SUT VIC AB 2-0 CT1 TAPERPNT 27 (SUTURE) ×3 IMPLANT
SUT VIC AB 3-0 SH 27 (SUTURE) ×4
SUT VIC AB 3-0 SH 27X BRD (SUTURE) ×2 IMPLANT
SUT VICRYL+ 3-0 144IN (SUTURE) IMPLANT
SUTURE EHLN 3-0 FS-10 30 BLK (SUTURE) ×1 IMPLANT
SWABSTK COMLB BENZOIN TINCTURE (MISCELLANEOUS) IMPLANT
SYR BULB IRRIG 60ML STRL (SYRINGE) ×3 IMPLANT
SYR CONTROL 10ML (SYRINGE) IMPLANT
TAPE TRANSPORE STRL 2 31045 (GAUZE/BANDAGES/DRESSINGS) IMPLANT
TOWEL OR 17X26 4PK STRL BLUE (TOWEL DISPOSABLE) ×3 IMPLANT

## 2017-06-03 NOTE — Anesthesia Preprocedure Evaluation (Signed)
Anesthesia Evaluation  Patient identified by MRN, date of birth, ID band Patient awake    Reviewed: Allergy & Precautions, NPO status , Patient's Chart, lab work & pertinent test results  History of Anesthesia Complications Negative for: history of anesthetic complications  Airway Mallampati: II  TM Distance: >3 FB Neck ROM: Full    Dental  (+) Caps, Dental Advidsory Given   Pulmonary neg sleep apnea, neg COPD, former smoker,    breath sounds clear to auscultation- rhonchi (-) wheezing      Cardiovascular hypertension, Pt. on medications (-) CAD and (-) Past MI  Rhythm:Regular Rate:Normal - Systolic murmurs and - Diastolic murmurs    Neuro/Psych neg Seizures PSYCHIATRIC DISORDERS Depression  Neuromuscular disease negative neurological ROS     GI/Hepatic Neg liver ROS, GERD  ,  Endo/Other  diabetes  Renal/GU Renal disease     Musculoskeletal  (+) Arthritis ,   Abdominal (+) - obese,   Peds  Hematology  (+) anemia ,   Anesthesia Other Findings Past Medical History: No date: Anemia in pregnancy No date: Arthritis     Comment: hands No date: Barrett esophagus No date: Cancer ([place])     Comment: melanoma No date: Depression No date: Depression No date: Diabetes mellitus without complication (HCC)     Comment: diet controlled No date: Family history of adverse reaction to anesthes*     Comment: son PONV No date: GERD (gastroesophageal reflux disease) No date: Hypercholesteremia No date: Hyperlipidemia No date: Hypertension No date: MVP (mitral valve prolapse)     Comment: followed by PCP No date: Nephritis No date: Nephritis No date: S/P appendectomy No date: Torn meniscus     Comment: left   Reproductive/Obstetrics                             Anesthesia Physical  Anesthesia Plan  ASA: III  Anesthesia Plan: General   Post-op Pain Management:    Induction:  Intravenous  PONV Risk Score and Plan: 2 and Dexamethasone and Ondansetron  Airway Management Planned: LMA  Additional Equipment:   Intra-op Plan:   Post-operative Plan: Extubation in OR  Informed Consent: I have reviewed the patients History and Physical, chart, labs and discussed the procedure including the risks, benefits and alternatives for the proposed anesthesia with the patient or authorized representative who has indicated his/her understanding and acceptance.   Dental advisory given  Plan Discussed with: CRNA and Anesthesiologist  Anesthesia Plan Comments:         Anesthesia Quick Evaluation

## 2017-06-03 NOTE — Op Note (Signed)
Preoperative diagnosis: Extensive DCIS of the right breast.  Postoperative diagnosis: Same.  Operative procedure: Right simple mastectomy with sentinel node biopsy, immediate reconstruction.  Operating Surgeon: Hervey Ard, MD.  Audelia Hives, DO (reconstruction).  Anesthesia: General endotracheal.  Estimated blood loss: Less than 50 cc.  Clinical note: This 77 year old woman was found to have extensive DCIS.  She desired immediate reconstruction.  Operative note: The patient received Cipro due to a penicillin allergy.  SCD stockings for DVT prevention.  She underwent general endotracheal anesthesia without difficulty.  She had previously been injected with technetium sulfur colloid.  5 cc of 0.5% methylene blue was injected in the subareolar plexus after the induction of anesthesia.  The breast chest and axilla was cleansed with ChloraPrep and draped.  A narrow ellipse of skin about 5 mm outside the edge of the areola was made carried down through skin subtendinous tissue.  Hemostasis was with the photon blade.  Flaps were then elevated to the clavicle superiorly, sternum medially, rectus fascia inferiorly and the serratus muscle laterally.  These were about 5 mm in thickness.  The breast was then elevated off the underlying pectoralis muscle taking the fascia that muscle with the specimen.  Intercostal vessel was controlled with 3-0 Vicryl tie.  Attention was turned of the axilla and these node seeker device was used to identify the pair of lymph nodes 1 hot and blue, 1 hot original counts approximately 2500 and 300 and then a isolated node of 3000 that was blue as well.  These were sent in formalin for routine histology.  The wound was irrigated with sterile water.  At this time the procedure was turned over to plastics for reconstruction.  This will be dictated separately.

## 2017-06-03 NOTE — Interval H&P Note (Signed)
History and Physical Interval Note:  06/03/2017 10:39 AM  Natasha Vasquez  has presented today for surgery, with the diagnosis of RIGHT BREAST CANCER  The various methods of treatment have been discussed with the patient and family. After consideration of risks, benefits and other options for treatment, the patient has consented to  Procedure(s): MASTECTOMY WITH SENTINEL LYMPH NODE BIOPSY (Right) BREAST RECONSTRUCTION WITH PLACEMENT OF TISSUE EXPANDER AND FLEX HD (ACELLULAR HYDRATED DERMIS) (Right) as a surgical intervention .  The patient's history has been reviewed, patient examined, no change in status, stable for surgery.  I have reviewed the patient's chart and labs.  Questions were answered to the patient's satisfaction.     Loel Lofty Dillingham

## 2017-06-03 NOTE — H&P (Signed)
No interval change in clinical exam or history.  For right mastectomy /SLN biopsy with immediate reconstruction.

## 2017-06-03 NOTE — Anesthesia Postprocedure Evaluation (Signed)
Anesthesia Post Note  Patient: Natasha Vasquez  Procedure(s) Performed: MASTECTOMY WITH SENTINEL LYMPH NODE BIOPSY (Right ) BREAST RECONSTRUCTION WITH PLACEMENT OF TISSUE EXPANDER AND FLEX HD (ACELLULAR HYDRATED DERMIS) (Right )  Patient location during evaluation: PACU Anesthesia Type: General Level of consciousness: awake and alert and oriented Pain management: pain level controlled Vital Signs Assessment: post-procedure vital signs reviewed and stable Respiratory status: spontaneous breathing Cardiovascular status: blood pressure returned to baseline Anesthetic complications: no     Last Vitals:  Vitals:   06/03/17 1853 06/03/17 1942  BP: (!) 148/81 (!) 153/78  Pulse: 87 88  Resp: 17 18  Temp: (!) 36.4 C 36.5 C  SpO2:  97%    Last Pain:  Vitals:   06/03/17 1944  TempSrc:   PainSc: 5                  Quinnton Bury

## 2017-06-03 NOTE — Anesthesia Procedure Notes (Signed)

## 2017-06-03 NOTE — Op Note (Signed)
Op report    DATE OF OPERATION:  06/03/2017  LOCATION: [facility]  SURGICAL DIVISION: Plastic Surgery  PREOPERATIVE DIAGNOSES:  1. Right Breast cancer.    POSTOPERATIVE DIAGNOSES:  1. Right Breast cancer.   PROCEDURE:  1. Right immediate breast reconstruction with placement of Acellular Dermal Matrix and tissue expanders.  SURGEON: Demitrious Mccannon Sanger Kenzli Barritt, DO  ANESTHESIA:  General.   COMPLICATIONS: None.   IMPLANTS: Right - Mentor 535 cc. Ref #[id number].  Serial Number [id number], 200 cc of injectable saline placed in the expander. Acellular Dermal Matrix 4 x 16 cm  INDICATIONS FOR PROCEDURE:  The patient, Natasha Vasquez, is a 77 y.o. female born on 06/05/1940, is here for  immediate first stage breast reconstruction with placement of right tissue expander and Acellular dermal matrix. MRN: [id number]  CONSENT:  Informed consent was obtained directly from the patient. Risks, benefits and alternatives were fully discussed. Specific risks including but not limited to bleeding, infection, hematoma, seroma, scarring, pain, implant infection, implant extrusion, capsular contracture, asymmetry, wound healing problems, and need for further surgery were all discussed. The patient did have an ample opportunity to have her questions answered to her satisfaction.   DESCRIPTION OF PROCEDURE:  The patient was taken to the operating room by the general surgery team. SCDs were placed and IV antibiotics were given. The patient's chest was prepped and draped in a sterile fashion. A time out was performed and the implants to be used were identified.  A right mastectomy was performed.  Once the general surgery team had completed their portion of the case the patient was rendered to the plastic and reconstructive surgery team.  Right:  The pectoralis major muscle was lifted from the chest wall with release of the lateral edge and lateral inframammary fold.  The pocket was irrigated  with antibiotic solution and hemostasis was achieved with electrocautery.  The ADM was then prepared according to the manufacture guidelines and slits placed to help with postoperative fluid management.  The ADM was then sutured to the inferior and lateral edge of the inframammary fold with 2-0 PDS starting with an interrupted stitch and then a running stitch.  The lateral portion was sutured to with interrupted sutures after the expander was placed.  The expander was prepared according to the manufacture guidelines, the air evacuated and then it was placed under the ADM and pectoralis major muscle.  The inferior and lateral tabs were used to secure the expander to the chest wall with 2-0 PDS.  The drain was placed at the inframammary fold over the ADM and secured to the skin with 3-0 Silk.    The deep layers were closed with 3-0 Monocryl followed by 4-0 Monocryl.  The skin was closed with 5-0 Monocryl and then dermabond was applied.  The ABDs and breast binder were placed.  The patient tolerated the procedure well and there were no complications.  The patient was allowed to wake from anesthesia and taken to the recovery room in satisfactory condition.

## 2017-06-03 NOTE — Anesthesia Post-op Follow-up Note (Signed)
Anesthesia QCDR form completed.        

## 2017-06-03 NOTE — Transfer of Care (Signed)
Immediate Anesthesia Transfer of Care Note  Patient: Natasha Vasquez  Procedure(s) Performed: MASTECTOMY WITH SENTINEL LYMPH NODE BIOPSY (Right ) BREAST RECONSTRUCTION WITH PLACEMENT OF TISSUE EXPANDER AND FLEX HD (ACELLULAR HYDRATED DERMIS) (Right )  Patient Location: PACU  Anesthesia Type:General  Level of Consciousness: sedated and responds to stimulation  Airway & Oxygen Therapy: Patient Spontanous Breathing and Patient connected to face mask oxygen  Post-op Assessment: Report given to RN and Post -op Vital signs reviewed and stable  Post vital signs: Reviewed and stable  Last Vitals:  Vitals Value Taken Time  BP 160/73 06/03/2017  3:17 PM  Temp 36.9 C 06/03/2017  3:17 PM  Pulse 88 06/03/2017  3:21 PM  Resp 19 06/03/2017  3:21 PM  SpO2 97 % 06/03/2017  3:21 PM  Vitals shown include unvalidated device data.  Last Pain:  Vitals:   06/03/17 1042  TempSrc: Oral  PainSc: 0-No pain         Complications: No apparent anesthesia complications

## 2017-06-04 ENCOUNTER — Encounter: Payer: Self-pay | Admitting: General Surgery

## 2017-06-04 DIAGNOSIS — D0511 Intraductal carcinoma in situ of right breast: Secondary | ICD-10-CM | POA: Diagnosis not present

## 2017-06-04 MED ORDER — CIPROFLOXACIN HCL 500 MG PO TABS: 500.0000 mg | ORAL_TABLET | Freq: Two times a day (BID) | ORAL | 0 refills | Status: DC

## 2017-06-04 MED ORDER — TRAMADOL HCL 50 MG PO TABS: 50.0000 mg | ORAL_TABLET | ORAL | 0 refills | Status: DC | PRN

## 2017-06-04 NOTE — Care Management Obs Status (Signed)
[facility] NOTIFICATION   Patient Details  Name: Natasha Vasquez MRN: [id number] Date of Birth: 1940/11/11   Medicare Observation Status Notification Given:  No(admitted obs less than 24 hours)    Beverly Sessions, RN 06/04/2017, 10:07 AM

## 2017-06-04 NOTE — Progress Notes (Signed)
MD ordered patient to be discharged home.  Discharge instructions were reviewed with the patient and she voiced understanding.  Follow-up appointments were made. No new prescriptions given to the patient.  JP drain information and instructions on how to drain the JP were given.  IV was removed with catheter intact.  All patients questions were answered.

## 2017-06-04 NOTE — Final Progress Note (Signed)
AVSS. Sore, but otherwise comfortable. Better relief with IV Tylenol than Tramadol. Drainage: [place]. Lungs: Clear. Has been instructed in drain care. F/U next week with Plastics. Has RX for Cipro and tramadol.

## 2017-06-06 ENCOUNTER — Telehealth: Payer: Self-pay | Admitting: *Deleted

## 2017-06-06 ENCOUNTER — Telehealth: Payer: Self-pay | Admitting: General Surgery

## 2017-06-06 NOTE — Telephone Encounter (Signed)
Patients husband called and stated that the patient had a mastectomy on 06/03/2017 and this morning when he went to wake her up she was very "loop" and the medication that she was talking was spilled in the floor. Patient did smile a little and moved both hands. Patients husband stated that she is sitting up now and drinking coffee but still seems "loopy" She did walk from the bed to the chair but her husband helped her because he didn't want her to fall.

## 2017-06-06 NOTE — Telephone Encounter (Signed)
I talked with the patient and husband. She thought today was Wednesday  But otherwise alert and oriented., mild lightheadedness, she is setting the clock to take her tramadol and tylenol. Husband states she was "great" yesterday. Aware to stop tramadol per Dr Bary Castilla and call in the am with status update. Aware someone on call and ED if symptoms worsen. May need to consider her PCP, husband agrees. Appreciates call.

## 2017-06-06 NOTE — Telephone Encounter (Signed)
The patient was contacted in follow-up of her call earlier today regarding some "loopiness" thought secondary to tramadol.  This has improved.  Patient is experiencing only occasional shooting pains at the surgical site status post breast reconstruction postmastectomy.  Notified that the pathology showed only DCIS, sentinel nodes all negative, ER status pending.  Follow-up next week with plastics as planned, follow-up here in 2 weeks.  She was encouraged to call if she has any recurrent neurologic symptoms.

## 2017-06-07 LAB — SURGICAL PATHOLOGY

## 2017-06-09 NOTE — Discharge Summary (Signed)
Physician Discharge Summary  Patient ID: Natasha Vasquez MRN: [id number] DOB/AGE: [birth year] 77 y.o.  Admit date: 06/03/2017 Discharge date: 06/09/2017  Admission Diagnoses: Extensive DCIS involving the right breast.  Discharge Diagnoses:  Active Problems:   DCIS (ductal carcinoma in situ)   Discharged Condition: good  Hospital Course: The patient was observed overnight for pain control.  She did well.  Consults: Plastic surgery.  Significant Diagnostic Studies:   DIAGNOSIS:  A. RIGHT BREAST; SIMPLE MASTECTOMY:  - DUCTAL CARCINOMA IN SITU, NUCLEAR GRADE 3 WITH COMEDONECROSIS.  - BIOPSY SITE CHANGES, 3 MARKER CLIPS PRESENT.  - SURGICAL MARGINS ARE NEGATIVE.  - BACKGROUND FIBROCYSTIC CHANGE.   B. SENTINEL LYMPH NODES 1,2,3; EXCISION:  - NO TUMOR SEEN IN THREE LYMPH NODES (0/3).  - SEE SUMMARY BELOW.  CANCER CASE SUMMARY: DUCTAL CARCINOMA IN SITU OF THE BREAST  Procedure: Simple mastectomy  Specimen Laterality: Right  Size (Extent) of DCIS: at least 36 mm  Histologic Type: Ductal carcinoma in situ  Nuclear Grade: 3  Necrosis: Present, central comedonecrosis  Margins: Uninvolved by DCIS            Distance from closest margin: 18 mm            Specify closest margin: Deep  Regional Lymph nodes: Uninvolved by tumor cells  Number of Lymph Nodes Examined: 3  Number of Sentinel Nodes Examined: 3  Pathologic Stage Classification (pTNM, AJCC 8th Edition): pTis(DCIS)  pN0(sn)  TNM Descriptors: Not applicable  Treatments: surgery: Right mastectomy with immediate reconstruction.  Discharge Exam: Blood pressure (!) 126/56, pulse 87, temperature 98.7 F (37.1 C), temperature source Oral, resp. rate 16, height 5' 5.5" (1.664 m), weight 178 lb (80.7 kg), SpO2 96 %. General appearance: alert and cooperative Resp: clear to auscultation bilaterally Cardio: regular rate and rhythm, S1, S2 normal, no murmur, click, rub or gallop Drain sites  intact.  Disposition:   Discharge Instructions    Diet - low sodium heart healthy   Complete by:  As directed    Discharge instructions   Complete by:  As directed    Tylenol: Take around the clock for the next 48 hours for comfort, then as needed.  Tramadol: If needed .  Diet as tolerated.  Avoid repetitive activities with the right arm.  No showers. You may wash under your arm and use deodorant as needed.   Increase activity slowly   Complete by:  As directed      Allergies as of 06/04/2017      Reactions   Codeine Other (See Comments)   Chest pain   Opium Other (See Comments)   Chest pain---tolerated MORPHINE   Oxycodone Other (See Comments)   MENTAL CHANGES   Penicillins    Has patient had a PCN reaction causing immediate rash, facial/tongue/throat swelling, SOB or lightheadedness with hypotension: Yes Has patient had a PCN reaction causing severe rash involving mucus membranes or skin necrosis: No Has patient had a PCN reaction that required hospitalization: NO Has patient had a PCN reaction occurring within the last 10 years: No If all of the above answers are "NO", then may proceed with Cephalosporin use. Can take Augmentin   Aleve [naproxen Sodium] Hives, Rash, Other (See Comments)   headaches   Naproxen Hives, Rash   And headaches      Medication List    TAKE these medications   acetaminophen 500 MG tablet Commonly known as:  TYLENOL Take 1,000 mg by mouth every 6 (six) hours as needed  for mild pain. Notes to patient:  Last dose today at 5:25am   ARIPiprazole 2 MG tablet Commonly known as:  ABILIFY TAKE 1 TABLET (2 MG TOTAL) BY MOUTH EVERY EVENING.   aspirin 81 MG tablet Take 81 mg by mouth daily.   atorvastatin 20 MG tablet Commonly known as:  LIPITOR TAKE 1 TABLET BY MOUTH EVERY DAY   bismuth subsalicylate 497 MG chewable tablet Commonly known as:  PEPTO BISMOL Chew 524 mg by mouth 3 (three) times daily as needed.   ciprofloxacin 500 MG  tablet Commonly known as:  CIPRO Take 1 tablet (500 mg total) by mouth 2 (two) times daily.   clindamycin 150 MG capsule Commonly known as:  CLEOCIN Take 600 mg by mouth See admin instructions. TAKE 4 CAPSULES (600 MG) 1 HOUR PRIOR TO DENTAL APPOINTMENT   lisinopril 5 MG tablet Commonly known as:  PRINIVIL,ZESTRIL Take 1 tablet (5 mg total) by mouth daily.   multivitamin with minerals Tabs tablet Take 1 tablet by mouth daily. CENTRUM SILVER   omeprazole 40 MG capsule Commonly known as:  PRILOSEC Take 40 mg by mouth daily before breakfast.   oxybutynin 10 MG 24 hr tablet Commonly known as:  DITROPAN-XL TAKE 1 TABLET BY MOUTH EVERYDAY AT BEDTIME   pantoprazole 40 MG tablet Commonly known as:  PROTONIX TAKE 1 TABLET BY MOUTH EVERY DAY   ranitidine 150 MG capsule Commonly known as:  ZANTAC Take 150 mg by mouth at bedtime.   traMADol 50 MG tablet Commonly known as:  ULTRAM Take 1 tablet (50 mg total) by mouth every 4 (four) hours as needed for moderate pain or severe pain. Notes to patient:  Last dose today at 3:25am   TUMS SMOOTHIES 750 MG chewable tablet Generic drug:  calcium carbonate Chew 1 tablet by mouth 3 (three) times daily as needed for heartburn.   Venlafaxine HCl 225 MG Tb24 Take 1 tablet (225 mg total) by mouth daily.      Follow-up Information    Dillingham, Loel Lofty, DO. Go on 06/11/2017.   Specialty:  Plastic Surgery Why:  Tuesday at 11:20am for hospital follow-up Contact information: [place] [address] [phone]        Robert Bellow, MD. Go on 06/18/2017.   Specialties:  General Surgery, Radiology Why:  Tuesday at 9:00am for hospital follow-up Contact information: [address] [place] [address] [phone]           Signed: Robert Bellow 06/09/2017, 3:31 PM

## 2017-06-11 DIAGNOSIS — Z9011 Acquired absence of right breast and nipple: Secondary | ICD-10-CM | POA: Insufficient documentation

## 2017-06-12 ENCOUNTER — Ambulatory Visit: Payer: Medicare Other | Admitting: Family Medicine

## 2017-06-12 ENCOUNTER — Ambulatory Visit (INDEPENDENT_AMBULATORY_CARE_PROVIDER_SITE_OTHER): Payer: Medicare Other | Admitting: Family Medicine

## 2017-06-12 VITALS — BP 124/76 | HR 89 | Temp 97.6°F | Resp 16 | Wt 179.0 lb

## 2017-06-12 DIAGNOSIS — F331 Major depressive disorder, recurrent, moderate: Secondary | ICD-10-CM | POA: Diagnosis not present

## 2017-06-12 NOTE — Assessment & Plan Note (Addendum)
Very well controlled, despite circumstances with recent breat cancer diagnosis and surgery Continue abilify and effexor at current doses Has a good support system, but may also benefit from therapy/counseling F/u in 3 months

## 2017-06-12 NOTE — Patient Instructions (Signed)
Continue Effexor and Abilify at current doses

## 2017-06-12 NOTE — Progress Notes (Signed)
Patient: Natasha Vasquez Female    DOB: [birth year]   77 y.o.   MRN: [id number] Visit Date: 06/12/2017  Today's Provider: Lavon Paganini, MD   I, Martha Clan, CMA, am acting as scribe for Lavon Paganini, MD.  Chief Complaint  Patient presents with  . Depression   Subjective:    Depression         Chronicity: FU from 03/11/2017. Pt was advised to continue Abilify 2 mg, and increase Effexor to 225 mg.  The problem has been gradually improving since onset.  Associated symptoms include fatigue (S/P mastectomy procedure) and sad (due to recent breast cancer diagnosis).  Associated symptoms include no decreased concentration, no helplessness, no hopelessness, does not have insomnia, not irritable, no restlessness, no decreased interest, no appetite change, no headaches and no suicidal ideas.  Compliance with treatment is good.  Previous treatment provided moderate (She states she is 30-50% improved) relief.  Risk factors include a recent illness.   "Less effort to feel relaxed/not uptight" and "More natural" since increasing Effexor dose.  Feels she is dealing well with the stress.  Depression screen [facility] 2/9 06/12/2017 03/11/2017 01/24/2016  Decreased Interest 0 1 0  Down, Depressed, Hopeless 0 2 0  PHQ - 2 Score 0 3 0  Altered sleeping 1 1 -  Tired, decreased energy 2 2 -  Change in appetite 0 1 -  Feeling bad or failure about yourself  0 1 -  Trouble concentrating 0 0 -  Moving slowly or fidgety/restless 0 0 -  Suicidal thoughts 0 0 -  PHQ-9 Score 3 8 -  Difficult doing work/chores Not difficult at all Not difficult at all -    GAD 7 : Generalized Anxiety Score 06/12/2017  Nervous, Anxious, on Edge 2  Control/stop worrying 1  Worry too much - different things 0  Trouble relaxing 1  Restless 0  Easily annoyed or irritable 0  Afraid - awful might happen 0  Total GAD 7 Score 4  Anxiety Difficulty Somewhat difficult      Allergies  Allergen Reactions  . Codeine  Other (See Comments)    Chest pain  . Lorazepam Other (See Comments)    "Feels loopy"  . Opium Other (See Comments)    Chest pain---tolerated MORPHINE  . Oxycodone Other (See Comments)    MENTAL CHANGES  . Penicillins     Has patient had a PCN reaction causing immediate rash, facial/tongue/throat swelling, SOB or lightheadedness with hypotension: Yes Has patient had a PCN reaction causing severe rash involving mucus membranes or skin necrosis: No Has patient had a PCN reaction that required hospitalization: NO Has patient had a PCN reaction occurring within the last 10 years: No If all of the above answers are "NO", then may proceed with Cephalosporin use.  Can take Augmentin  . Aleve [Naproxen Sodium] Hives, Rash and Other (See Comments)    headaches  . Naproxen Hives and Rash    And headaches     Current Outpatient Medications:  .  acetaminophen (TYLENOL) 500 MG tablet, Take 1,000 mg by mouth every 6 (six) hours as needed for mild pain. , Disp: , Rfl:  .  ARIPiprazole (ABILIFY) 2 MG tablet, TAKE 1 TABLET (2 MG TOTAL) BY MOUTH EVERY EVENING., Disp: 90 tablet, Rfl: 1 .  atorvastatin (LIPITOR) 20 MG tablet, TAKE 1 TABLET BY MOUTH EVERY DAY, Disp: 90 tablet, Rfl: 1 .  bismuth subsalicylate (PEPTO BISMOL) 262 MG chewable tablet, Chew  524 mg by mouth 3 (three) times daily as needed. , Disp: , Rfl:  .  calcium carbonate (TUMS SMOOTHIES) 750 MG chewable tablet, Chew 1 tablet by mouth 3 (three) times daily as needed for heartburn., Disp: , Rfl:  .  lisinopril (PRINIVIL,ZESTRIL) 5 MG tablet, Take 1 tablet (5 mg total) by mouth daily., Disp: 90 tablet, Rfl: 1 .  Multiple Vitamin (MULTIVITAMIN WITH MINERALS) TABS tablet, Take 1 tablet by mouth daily. CENTRUM SILVER, Disp: , Rfl:  .  omeprazole (PRILOSEC) 40 MG capsule, Take 40 mg by mouth daily before breakfast., Disp: , Rfl:  .  oxybutynin (DITROPAN-XL) 10 MG 24 hr tablet, TAKE 1 TABLET BY MOUTH EVERYDAY AT BEDTIME, Disp: 90 tablet, Rfl: 1 .   ranitidine (ZANTAC) 150 MG capsule, Take 150 mg by mouth at bedtime., Disp: , Rfl:  .  Venlafaxine HCl 225 MG TB24, Take 1 tablet (225 mg total) by mouth daily., Disp: 90 each, Rfl: 2 .  aspirin 81 MG tablet, Take 81 mg by mouth daily., Disp: , Rfl:  .  clindamycin (CLEOCIN) 150 MG capsule, Take 600 mg by mouth See admin instructions. TAKE 4 CAPSULES (600 MG) 1 HOUR PRIOR TO DENTAL APPOINTMENT, Disp: , Rfl:   Review of Systems  Constitutional: Positive for fatigue (S/P mastectomy procedure). Negative for appetite change.  Neurological: Negative for headaches.  Psychiatric/Behavioral: Positive for depression. Negative for decreased concentration and suicidal ideas. The patient does not have insomnia.     Social History   Tobacco Use  . Smoking status: Former Smoker    Types: Cigarettes    Last attempt to quit: 1965    Years since quitting: 54.4  . Smokeless tobacco: Never Used  . Tobacco comment: smoked in college  Substance Use Topics  . Alcohol use: Yes    Alcohol/week: 4.2 - 8.4 oz    Types: 7 - 14 Glasses of wine per week    Comment: 1-2 glasses of wine per night   Objective:   BP 124/76 (BP Location: Left Arm, Patient Position: Sitting, Cuff Size: Normal)   Pulse 89   Temp 97.6 F (36.4 C) (Oral)   Resp 16   Wt 179 lb (81.2 kg)   SpO2 96%   BMI 29.33 kg/m  Vitals:   06/12/17 1320  BP: 124/76  Pulse: 89  Resp: 16  Temp: 97.6 F (36.4 C)  TempSrc: Oral  SpO2: 96%  Weight: 179 lb (81.2 kg)     Physical Exam  Constitutional: She is oriented to person, place, and time. She appears well-developed and well-nourished. She is not irritable. No distress.  HENT:  Head: Normocephalic and atraumatic.  Eyes: Conjunctivae are normal. No scleral icterus.  Cardiovascular: Normal rate, regular rhythm, normal heart sounds and intact distal pulses.  No murmur heard. Pulmonary/Chest: Effort normal and breath sounds normal. No respiratory distress. She has no wheezes. She has no  rales.  Musculoskeletal: She exhibits no edema.  Neurological: She is alert and oriented to person, place, and time.  Skin: Capillary refill takes less than 2 seconds.  Psychiatric: She has a normal mood and affect. Her behavior is normal.  Vitals reviewed.      Assessment & Plan:     Problem List Items Addressed This Visit      Other   Clinical depression - Primary    Very well controlled, despite circumstances with recent breat cancer diagnosis and surgery Continue abilify and effexor at current doses Has a good support system, but may  also benefit from therapy/counseling F/u in 3 months          Return in about 3 months (around 09/12/2017) for Diabetes, HTN f/u.   The entirety of the information documented in the History of Present Illness, Review of Systems and Physical Exam were personally obtained by me. Portions of this information were initially documented by Raquel Sarna Ratchford, CMA and reviewed by me for thoroughness and accuracy.    Virginia Crews, MD, MPH [facility] 06/12/2017 2:27 PM

## 2017-06-18 ENCOUNTER — Encounter: Payer: Self-pay | Admitting: General Surgery

## 2017-06-18 ENCOUNTER — Ambulatory Visit (INDEPENDENT_AMBULATORY_CARE_PROVIDER_SITE_OTHER): Payer: Medicare Other | Admitting: General Surgery

## 2017-06-18 VITALS — BP 130/70 | HR 72 | Resp 14 | Ht 65.0 in | Wt 180.0 lb

## 2017-06-18 DIAGNOSIS — D0511 Intraductal carcinoma in situ of right breast: Secondary | ICD-10-CM

## 2017-06-18 NOTE — Progress Notes (Signed)
Patient ID: Natasha Vasquez, female   DOB: 1940/03/21, 77 y.o.   MRN: [id number]  Chief Complaint  Patient presents with  . Follow-up    HPI Natasha Vasquez is a 77 y.o. female here today for her follow up right mastectomy done on 06/03/2017. Patient states she is doing well. Drain sheet is present at visit.  Husband, Sonia Side is present at visit.  HPI  Past Medical History:  Diagnosis Date  . Anemia in pregnancy   . Arthritis    hands  . Barrett esophagus   . Cancer ([place])    melanoma  . Complication of anesthesia    20 years ago pts b/p dropped really low  . Depression   . Depression   . Diabetes mellitus without complication (HCC)    diet controlled  . Family history of adverse reaction to anesthesia    son PONV  . GERD (gastroesophageal reflux disease)   . Hypercholesteremia   . Hyperlipidemia   . Hypertension   . MVP (mitral valve prolapse)    followed by PCP  . Nephritis   . Nephritis   . S/P appendectomy   . Torn meniscus    left    Past Surgical History:  Procedure Laterality Date  . ABDOMINAL HYSTERECTOMY    . APPENDECTOMY    . BREAST BIOPSY Right 04/25/2017   retroareolar 10:00   wing clip   path pending  . BREAST BIOPSY Right 04/25/2017   10:00  5CMFN  venus clip    path pending  . BREAST BIOPSY Right 05/03/2017   Affirm Bx- path pending  . BREAST CYST ASPIRATION Bilateral    NEG  . BREAST EXCISIONAL BIOPSY Left 20+ yrs ago   NEG  . BREAST RECONSTRUCTION WITH PLACEMENT OF TISSUE EXPANDER AND FLEX HD (ACELLULAR HYDRATED DERMIS) Right 06/03/2017   Procedure: BREAST RECONSTRUCTION WITH PLACEMENT OF TISSUE EXPANDER AND FLEX HD (ACELLULAR HYDRATED DERMIS);  Surgeon: Wallace Going, DO;  Location: [facility];  Service: Plastics;  Laterality: Right;  . BROW LIFT Bilateral 02/01/2015   Procedure: BLEPHAROPLASTY bilateral upper eyelids.;  Surgeon: Karle Starch, MD;  Location: [facility];  Service: Ophthalmology;  Laterality: Bilateral;  DIABETIC -  diet controlled  . CHOLECYSTECTOMY    . COLONOSCOPY    . COLONOSCOPY WITH PROPOFOL N/A 05/11/2015   Procedure: COLONOSCOPY WITH PROPOFOL;  Surgeon: Manya Silvas, MD;  Location: [facility];  Service: Endoscopy;  Laterality: N/A;  . ESOPHAGOGASTRODUODENOSCOPY    . ESOPHAGOGASTRODUODENOSCOPY (EGD) WITH PROPOFOL  05/11/2015   Procedure: ESOPHAGOGASTRODUODENOSCOPY (EGD) WITH PROPOFOL;  Surgeon: Manya Silvas, MD;  Location: [facility];  Service: Endoscopy;;  . FRACTURE SURGERY Right    arm and shoulder  . JOINT REPLACEMENT    . KNEE ARTHROSCOPY Left 03/14/2016   Procedure: ARTHROSCOPY KNEE, PARTIAL MEDIAL MENISECTOMY, CHONDROPLASTY;  Surgeon: Dereck Leep, MD;  Location: [facility];  Service: Orthopedics;  Laterality: Left;  Marland Kitchen MASTECTOMY W/ SENTINEL NODE BIOPSY Right 06/03/2017   Procedure: MASTECTOMY WITH SENTINEL LYMPH NODE BIOPSY;  Surgeon: Robert Bellow, MD;  Location: [facility];  Service: General;  Laterality: Right;  . PARTIAL HIP ARTHROPLASTY Right   . TONSILLECTOMY      Family History  Problem Relation Age of Onset  . Cancer Mother        colon/rectal  . Colon cancer Mother   . Heart disease Father   . Hypertension Father   . Atrial fibrillation Father   . Diabetes Father   .  Cancer Brother        lung  . Healthy Son   . Healthy Son   . Breast cancer Neg Hx     Social History Social History   Tobacco Use  . Smoking status: Former Smoker    Types: Cigarettes    Last attempt to quit: 1965    Years since quitting: 54.4  . Smokeless tobacco: Never Used  . Tobacco comment: smoked in college  Substance Use Topics  . Alcohol use: Yes    Alcohol/week: 4.2 - 8.4 oz    Types: 7 - 14 Glasses of wine per week    Comment: 1-2 glasses of wine per night  . Drug use: No    Allergies  Allergen Reactions  . Codeine Other (See Comments)    Chest pain  . Lorazepam Other (See Comments)    "Feels loopy"  . Opium Other (See Comments)    Chest pain---tolerated  MORPHINE  . Oxycodone Other (See Comments)    MENTAL CHANGES  . Penicillins     Has patient had a PCN reaction causing immediate rash, facial/tongue/throat swelling, SOB or lightheadedness with hypotension: Yes Has patient had a PCN reaction causing severe rash involving mucus membranes or skin necrosis: No Has patient had a PCN reaction that required hospitalization: NO Has patient had a PCN reaction occurring within the last 10 years: No If all of the above answers are "NO", then may proceed with Cephalosporin use.  Can take Augmentin  . Aleve [Naproxen Sodium] Hives, Rash and Other (See Comments)    headaches  . Naproxen Hives and Rash    And headaches    Current Outpatient Medications  Medication Sig Dispense Refill  . acetaminophen (TYLENOL) 500 MG tablet Take 1,000 mg by mouth every 6 (six) hours as needed for mild pain.     . ARIPiprazole (ABILIFY) 2 MG tablet TAKE 1 TABLET (2 MG TOTAL) BY MOUTH EVERY EVENING. 90 tablet 1  . aspirin 81 MG tablet Take 81 mg by mouth daily.    Marland Kitchen atorvastatin (LIPITOR) 20 MG tablet TAKE 1 TABLET BY MOUTH EVERY DAY 90 tablet 1  . bismuth subsalicylate (PEPTO BISMOL) 262 MG chewable tablet Chew 524 mg by mouth 3 (three) times daily as needed.     . calcium carbonate (TUMS SMOOTHIES) 750 MG chewable tablet Chew 1 tablet by mouth 3 (three) times daily as needed for heartburn.    . clindamycin (CLEOCIN) 150 MG capsule Take 600 mg by mouth See admin instructions. TAKE 4 CAPSULES (600 MG) 1 HOUR PRIOR TO DENTAL APPOINTMENT    . lisinopril (PRINIVIL,ZESTRIL) 5 MG tablet Take 1 tablet (5 mg total) by mouth daily. 90 tablet 1  . Multiple Vitamin (MULTIVITAMIN WITH MINERALS) TABS tablet Take 1 tablet by mouth daily. CENTRUM SILVER    . omeprazole (PRILOSEC) 40 MG capsule Take 40 mg by mouth daily before breakfast.    . oxybutynin (DITROPAN-XL) 10 MG 24 hr tablet TAKE 1 TABLET BY MOUTH EVERYDAY AT BEDTIME 90 tablet 1  . ranitidine (ZANTAC) 150 MG capsule Take  150 mg by mouth at bedtime.    . Venlafaxine HCl 225 MG TB24 Take 1 tablet (225 mg total) by mouth daily. 90 each 2   No current facility-administered medications for this visit.     Review of Systems Review of Systems  Constitutional: Negative.   Respiratory: Negative.   Cardiovascular: Negative.     Blood pressure 130/70, pulse 72, resp. rate 14, height 5\' 5"  (  1.651 m), weight 180 lb (81.6 kg).  Physical Exam Physical Exam  Constitutional: She is oriented to person, place, and time. She appears well-developed and well-nourished.  Pulmonary/Chest:    Neurological: She is alert and oriented to person, place, and time.  Skin: Skin is warm and dry.    Data Reviewed    Surgical Pathology  THIS IS AN ADDENDUM REPORT  CASE: [id number]  PATIENT: [facility]  Surgical Pathology Report    Reason for Addendum #1: Additional clinical/test information   SPECIMEN SUBMITTED:  A. Breast, right  B. Sentinel node 1,2,3   CLINICAL HISTORY:  None provided   PRE-OPERATIVE DIAGNOSIS:  Right breast cancer   POST-OPERATIVE DIAGNOSIS:  Same as pre-op      DIAGNOSIS:  A. RIGHT BREAST; SIMPLE MASTECTOMY:  - DUCTAL CARCINOMA IN SITU, NUCLEAR GRADE 3 WITH COMEDONECROSIS.  - BIOPSY SITE CHANGES, 3 MARKER CLIPS PRESENT.  - SURGICAL MARGINS ARE NEGATIVE.  - BACKGROUND FIBROCYSTIC CHANGE.   B. SENTINEL LYMPH NODES 1,2,3; EXCISION:  - NO TUMOR SEEN IN THREE LYMPH NODES (0/3).  - SEE SUMMARY BELOW.   CANCER CASE SUMMARY: DUCTAL CARCINOMA IN SITU OF THE BREAST  Procedure: Simple mastectomy  Specimen Laterality: Right  Size (Extent) of DCIS: at least 36 mm  Histologic Type: Ductal carcinoma in situ  Nuclear Grade: 3  Necrosis: Present, central comedonecrosis  Margins: Uninvolved by DCIS            Distance from closest margin: 18 mm            Specify closest margin: Deep  Regional Lymph nodes: Uninvolved by tumor cells  Number of Lymph Nodes  Examined: 3  Number of Sentinel Nodes Examined: 3  Pathologic Stage Classification (pTNM, AJCC 8th Edition): pTis(DCIS)  pN0(sn)  TNM Descriptors: Not applicable         Weight of specimen: 550 grams    ADDENDUM:  BREAST BIOMARKER TESTS  Estrogen Receptor (ER) Status: NEGATIVE, positive internal control   Progesterone Receptor (PgR) Status: NEGATIVE, positive internal control        Assessment    Doing well status post right mastectomy with immediate reconstruction for DCIS.    Plan The patient's drain volumes have significantly decreased and likely the remaining drain will be removed tomorrow during her appointment with plastics.  Activity restrictions should be discussed with plastics.   No indication for antiestrogen therapy based on ER/PR status.  Return in five months. The patient is aware to call back for any questions or concerns.   HPI, Physical Exam, Assessment and Plan have been scribed under the direction and in the presence of Hervey Ard, MD.  Gaspar Cola, CMA  I have completed the exam and reviewed the above documentation for accuracy and completeness.  I agree with the above.  Haematologist has been used and any errors in dictation or transcription are unintentional.  Hervey Ard, M.D., F.A.C.S.  Forest Gleason Ilya Neely 06/18/2017, 8:08 PM

## 2017-06-18 NOTE — Patient Instructions (Signed)
Return in five months The patient is aware to call back for any questions or concerns.  

## 2017-06-24 ENCOUNTER — Encounter: Payer: Self-pay | Admitting: General Surgery

## 2017-08-04 ENCOUNTER — Other Ambulatory Visit: Payer: Self-pay | Admitting: Physician Assistant

## 2017-08-04 DIAGNOSIS — F4322 Adjustment disorder with anxiety: Secondary | ICD-10-CM

## 2017-08-09 ENCOUNTER — Other Ambulatory Visit: Payer: Self-pay | Admitting: Physician Assistant

## 2017-08-09 DIAGNOSIS — N3281 Overactive bladder: Secondary | ICD-10-CM

## 2017-08-12 ENCOUNTER — Other Ambulatory Visit: Payer: Self-pay | Admitting: Physician Assistant

## 2017-08-12 DIAGNOSIS — I10 Essential (primary) hypertension: Secondary | ICD-10-CM

## 2017-08-28 ENCOUNTER — Other Ambulatory Visit: Payer: Self-pay | Admitting: Family Medicine

## 2017-08-28 DIAGNOSIS — F4322 Adjustment disorder with anxiety: Secondary | ICD-10-CM

## 2017-09-12 ENCOUNTER — Encounter: Payer: Self-pay | Admitting: Family Medicine

## 2017-09-12 ENCOUNTER — Ambulatory Visit: Payer: Medicare Other | Admitting: Family Medicine

## 2017-09-12 VITALS — BP 126/70 | HR 73 | Temp 98.0°F | Wt 182.6 lb

## 2017-09-12 DIAGNOSIS — Z23 Encounter for immunization: Secondary | ICD-10-CM | POA: Diagnosis not present

## 2017-09-12 DIAGNOSIS — E119 Type 2 diabetes mellitus without complications: Secondary | ICD-10-CM | POA: Diagnosis not present

## 2017-09-12 DIAGNOSIS — I10 Essential (primary) hypertension: Secondary | ICD-10-CM

## 2017-09-12 DIAGNOSIS — F331 Major depressive disorder, recurrent, moderate: Secondary | ICD-10-CM | POA: Diagnosis not present

## 2017-09-12 NOTE — Assessment & Plan Note (Signed)
Well controlled Continue lisinopril Check BMP F/u in 6 months

## 2017-09-12 NOTE — Assessment & Plan Note (Signed)
Well controlled Continue Effexor and Abilify at current doses F/u in 6 months

## 2017-09-12 NOTE — Progress Notes (Signed)
Patient: Natasha Vasquez Female    DOB: 1940-02-09   77 y.o.   MRN: [id number] Visit Date: 09/12/2017  Today's Provider: Lavon Paganini, MD   Chief Complaint  Patient presents with  . Depression   Subjective:    I, Tiburcio Pea, CMA, am acting as a scribe for Lavon Paganini, MD.   HPI Depression: Patient presents for a 3 month follow up. Last OV was on 06/12/17. Patient was advised to continue Abilify 2 mg and increase Effexor to 225 mg. She reports good compliance with treatment plan. She states symptoms are much improved. She states she is still having nightmares.      Depression screen [facility] 2/9 09/12/2017 06/12/2017 03/11/2017 01/24/2016  Decreased Interest 1 0 1 0  Down, Depressed, Hopeless 1 0 2 0  PHQ - 2 Score 2 0 3 0  Altered sleeping 0 1 1 -  Tired, decreased energy 3 2 2  -  Change in appetite 0 0 1 -  Feeling bad or failure about yourself  1 0 1 -  Trouble concentrating 1 0 0 -  Moving slowly or fidgety/restless 0 0 0 -  Suicidal thoughts 0 0 0 -  PHQ-9 Score 7 3 8  -  Difficult doing work/chores Not difficult at all Not difficult at all Not difficult at all -   GAD 7 : Generalized Anxiety Score 06/12/2017  Nervous, Anxious, on Edge 2  Control/stop worrying 1  Worry too much - different things 0  Trouble relaxing 1  Restless 0  Easily annoyed or irritable 0  Afraid - awful might happen 0  Total GAD 7 Score 4  Anxiety Difficulty Somewhat difficult      T2DM - Checking BG at home: no - Medications: none - Compliance: n/a - Diet: low carb, low sodium - eye exam: needs - foot exam: UTD - microalbumin: n/a on ACEi - denies symptoms of hypoglycemia, polyuria, polydipsia, numbness extremities, foot ulcers/trauma    HTN: - Medications: lisinopril 5mg  daily - Compliance: good - Checking BP at home: no - Denies any SOB, CP, vision changes, LE edema, medication SEs, or symptoms of hypotension   Allergies  Allergen Reactions  . Codeine Other (See  Comments)    Chest pain  . Lorazepam Other (See Comments)    "Feels loopy"  . Opium Other (See Comments)    Chest pain---tolerated MORPHINE  . Oxycodone Other (See Comments)    MENTAL CHANGES  . Penicillins     Has patient had a PCN reaction causing immediate rash, facial/tongue/throat swelling, SOB or lightheadedness with hypotension: Yes Has patient had a PCN reaction causing severe rash involving mucus membranes or skin necrosis: No Has patient had a PCN reaction that required hospitalization: NO Has patient had a PCN reaction occurring within the last 10 years: No If all of the above answers are "NO", then may proceed with Cephalosporin use.  Can take Augmentin  . Aleve [Naproxen Sodium] Hives, Rash and Other (See Comments)    headaches  . Naproxen Hives and Rash    And headaches     Current Outpatient Medications:  .  acetaminophen (TYLENOL) 500 MG tablet, Take 1,000 mg by mouth every 6 (six) hours as needed for mild pain. , Disp: , Rfl:  .  ARIPiprazole (ABILIFY) 2 MG tablet, TAKE 1 TABLET (2 MG TOTAL) BY MOUTH EVERY EVENING., Disp: 90 tablet, Rfl: 2 .  aspirin 81 MG tablet, Take 81 mg by mouth daily., Disp: , Rfl:  .  atorvastatin (LIPITOR) 20 MG tablet, TAKE 1 TABLET BY MOUTH EVERY DAY, Disp: 90 tablet, Rfl: 1 .  bismuth subsalicylate (PEPTO BISMOL) 262 MG chewable tablet, Chew 524 mg by mouth 3 (three) times daily as needed. , Disp: , Rfl:  .  calcium carbonate (TUMS SMOOTHIES) 750 MG chewable tablet, Chew 1 tablet by mouth 3 (three) times daily as needed for heartburn., Disp: , Rfl:  .  clindamycin (CLEOCIN) 150 MG capsule, Take 600 mg by mouth See admin instructions. TAKE 4 CAPSULES (600 MG) 1 HOUR PRIOR TO DENTAL APPOINTMENT, Disp: , Rfl:  .  lisinopril (PRINIVIL,ZESTRIL) 5 MG tablet, TAKE 1 TABLET BY MOUTH EVERY DAY, Disp: 90 tablet, Rfl: 1 .  Multiple Vitamin (MULTIVITAMIN WITH MINERALS) TABS tablet, Take 1 tablet by mouth daily. CENTRUM SILVER, Disp: , Rfl:  .   omeprazole (PRILOSEC) 40 MG capsule, Take 40 mg by mouth daily before breakfast., Disp: , Rfl:  .  oxybutynin (DITROPAN-XL) 10 MG 24 hr tablet, TAKE 1 TABLET BY MOUTH EVERYDAY AT BEDTIME, Disp: 90 tablet, Rfl: 1 .  ranitidine (ZANTAC) 150 MG capsule, Take 150 mg by mouth at bedtime., Disp: , Rfl:  .  Venlafaxine HCl 225 MG TB24, Take 1 tablet (225 mg total) by mouth daily., Disp: 90 each, Rfl: 2  Review of Systems  Constitutional: Negative.   HENT: Negative.   Respiratory: Negative.   Cardiovascular: Negative.   Gastrointestinal: Negative.   Neurological: Negative.   Psychiatric/Behavioral: Negative.        Depression     Social History   Tobacco Use  . Smoking status: Former Smoker    Types: Cigarettes    Last attempt to quit: 1965    Years since quitting: 54.6  . Smokeless tobacco: Never Used  . Tobacco comment: smoked in college  Substance Use Topics  . Alcohol use: Yes    Alcohol/week: 7.0 - 14.0 standard drinks    Types: 7 - 14 Glasses of wine per week    Comment: 1-2 glasses of wine per night   Objective:   BP 126/70 (BP Location: Left Arm, Patient Position: Sitting, Cuff Size: Normal)   Pulse 73   Temp 98 F (36.7 C) (Oral)   Wt 182 lb 9.6 oz (82.8 kg)   SpO2 99%   BMI 30.39 kg/m  Vitals:   09/12/17 1312  BP: 126/70  Pulse: 73  Temp: 98 F (36.7 C)  TempSrc: Oral  SpO2: 99%  Weight: 182 lb 9.6 oz (82.8 kg)     Physical Exam  Constitutional: She is oriented to person, place, and time. She appears well-developed and well-nourished. No distress.  HENT:  Head: Normocephalic and atraumatic.  Eyes: Conjunctivae are normal. No scleral icterus.  Neck: Neck supple. No thyromegaly present.  Cardiovascular: Normal rate, regular rhythm, normal heart sounds and intact distal pulses.  Pulmonary/Chest: Effort normal and breath sounds normal. No respiratory distress. She has no wheezes. She has no rales.  Musculoskeletal: She exhibits no edema or deformity.    Lymphadenopathy:    She has no cervical adenopathy.  Neurological: She is alert and oriented to person, place, and time.  Skin: Skin is warm and dry. Capillary refill takes less than 2 seconds. No rash noted.  Psychiatric: She has a normal mood and affect. Her speech is normal and behavior is normal. She is not actively hallucinating. Cognition and memory are normal. She expresses no homicidal and no suicidal ideation. She expresses no suicidal plans and no homicidal plans.  Vitals  reviewed.       Assessment & Plan:   Problem List Items Addressed This Visit      Cardiovascular and Mediastinum   Essential hypertension    Well controlled Continue lisinopril Check BMP F/u in 6 months      Relevant Orders   Basic Metabolic Panel (BMET)     Endocrine   Type 2 diabetes mellitus ([place])    Previously well controlled Diet controlled, not on meds UTD on vaccines and foot exam On ACEi Recheck A1c Advised to schedule eye exam Discussed diet and exercise      Relevant Orders   Hemoglobin A1c     Other   Clinical depression - Primary    Well controlled Continue Effexor and Abilify at current doses F/u in 6 months        Other Visit Diagnoses    Need for influenza vaccination       Relevant Orders   Flu vaccine HIGH DOSE PF (Completed)       Return in about 6 months (around 03/15/2018) for AWV/CPE (after 2/25).   The entirety of the information documented in the History of Present Illness, Review of Systems and Physical Exam were personally obtained by me. Portions of this information were initially documented by Tiburcio Pea, CMA and reviewed by me for thoroughness and accuracy.    Virginia Crews, MD, MPH [facility] 09/12/2017 2:10 PM

## 2017-09-12 NOTE — Patient Instructions (Signed)
Call and schedule an eye exam

## 2017-09-12 NOTE — Assessment & Plan Note (Signed)
Previously well controlled Diet controlled, not on meds UTD on vaccines and foot exam On ACEi Recheck A1c Advised to schedule eye exam Discussed diet and exercise

## 2017-09-13 LAB — BASIC METABOLIC PANEL
BUN/Creatinine Ratio: 17 (ref 12–28)
BUN: 14 mg/dL (ref 8–27)
CALCIUM: 9.3 mg/dL (ref 8.7–10.3)
CHLORIDE: 104 mmol/L (ref 96–106)
CO2: 25 mmol/L (ref 20–29)
CREATININE: 0.83 mg/dL (ref 0.57–1.00)
GFR, EST AFRICAN AMERICAN: 79 mL/min/{1.73_m2} (ref >59)
GFR, EST NON AFRICAN AMERICAN: 68 mL/min/{1.73_m2} (ref >59)
Glucose: 92 mg/dL (ref 65–99)
Potassium: 4.4 mmol/L (ref 3.5–5.2)
Sodium: 144 mmol/L (ref 134–144)

## 2017-09-13 LAB — HEMOGLOBIN A1C
ESTIMATED AVERAGE GLUCOSE: 131 mg/dL
Hgb A1c MFr Bld: 6.2 % — ABNORMAL HIGH (ref 4.8–5.6)

## 2017-09-26 ENCOUNTER — Other Ambulatory Visit: Payer: Self-pay | Admitting: Family Medicine

## 2017-09-26 DIAGNOSIS — E7800 Pure hypercholesterolemia, unspecified: Secondary | ICD-10-CM

## 2017-09-26 MED ORDER — ATORVASTATIN CALCIUM 20 MG PO TABS: 20.0000 mg | ORAL_TABLET | Freq: Every day | ORAL | 1 refills | Status: DC

## 2017-09-26 NOTE — Telephone Encounter (Signed)
[facility] pharmacy faxed a refill request for the following medication. Thanks CC  atorvastatin (LIPITOR) 20 MG tablet

## 2017-10-15 ENCOUNTER — Encounter: Payer: Self-pay | Admitting: Plastic Surgery

## 2017-10-15 ENCOUNTER — Other Ambulatory Visit: Payer: Self-pay

## 2017-10-15 ENCOUNTER — Ambulatory Visit (INDEPENDENT_AMBULATORY_CARE_PROVIDER_SITE_OTHER): Payer: Medicare Other | Admitting: Plastic Surgery

## 2017-10-15 VITALS — BP 124/62 | HR 72 | Resp 12 | Ht 65.0 in | Wt 180.0 lb

## 2017-10-15 DIAGNOSIS — N651 Disproportion of reconstructed breast: Secondary | ICD-10-CM | POA: Diagnosis not present

## 2017-10-15 DIAGNOSIS — Z9011 Acquired absence of right breast and nipple: Secondary | ICD-10-CM

## 2017-10-15 DIAGNOSIS — D0511 Intraductal carcinoma in situ of right breast: Secondary | ICD-10-CM

## 2017-10-15 DIAGNOSIS — E119 Type 2 diabetes mellitus without complications: Secondary | ICD-10-CM

## 2017-10-15 NOTE — Progress Notes (Signed)
Subjective:    Patient ID: Natasha Vasquez, female    DOB: 07/10/1940, 77 y.o.   MRN: [id number]  The patient is a 77 yrs old wf here with her husband for follow up on her breast reconstruction.  She underwent a right mastectomy followed by an expander placement.  She now has 550 / 535 cc in the expander and would like to have the implant placed.  She has notable asymmetry to the left breast and would like to have it made to match.  She has been doing very well. No recent illnesses or changes in health status.    Past Cancer History:  She was diagnosed with right breast cancer. Her mammogram was done 04/02/17 and underwent a biopsy on 04/25/17 and 05/03/17. 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 She is 5 feet 5 inches tall, weight is 178 pounds. Preop bra is 38 C/D.  She has grade II ptosis with the left breast slightly larger than the right.  Her husband is Sonia Side.    Review of Systems  Constitutional: Negative.  Negative for activity change and appetite change.  HENT: Negative.   Eyes: Negative.   Respiratory: Negative.   Cardiovascular: Negative.  Negative for leg swelling.  Endocrine: Negative.   Genitourinary: Negative.   Musculoskeletal: Negative.   Skin: Negative for color change and wound.  Neurological: Negative.   Psychiatric/Behavioral: Negative.     Current Outpatient Medications:  .  acetaminophen (TYLENOL) 500 MG tablet, Take 1,000 mg by mouth every 6 (six) hours as needed for mild pain. , Disp: , Rfl:  .  ARIPiprazole (ABILIFY) 2 MG tablet, TAKE 1 TABLET (2 MG TOTAL) BY MOUTH EVERY EVENING., Disp: 90 tablet, Rfl: 2 .  aspirin 81 MG tablet, Take 81 mg by mouth daily., Disp: , Rfl:  .  atorvastatin (LIPITOR) 20 MG tablet, Take 1 tablet (20 mg total) by mouth daily., Disp: 90 tablet, Rfl: 1 .  bismuth subsalicylate (PEPTO BISMOL) 262 MG chewable tablet, Chew 524 mg by mouth 3 (three) times daily as needed. , Disp: , Rfl:  .  calcium  carbonate (TUMS SMOOTHIES) 750 MG chewable tablet, Chew 1 tablet by mouth 3 (three) times daily as needed for heartburn., Disp: , Rfl:  .  clindamycin (CLEOCIN) 150 MG capsule, Take 600 mg by mouth See admin instructions. TAKE 4 CAPSULES (600 MG) 1 HOUR PRIOR TO DENTAL APPOINTMENT, Disp: , Rfl:  .  lisinopril (PRINIVIL,ZESTRIL) 5 MG tablet, TAKE 1 TABLET BY MOUTH EVERY DAY, Disp: 90 tablet, Rfl: 1 .  Multiple Vitamin (MULTIVITAMIN WITH MINERALS) TABS tablet, Take 1 tablet by mouth daily. CENTRUM SILVER, Disp: , Rfl:  .  omeprazole (PRILOSEC) 40 MG capsule, Take 40 mg by mouth daily before breakfast., Disp: , Rfl:  .  oxybutynin (DITROPAN-XL) 10 MG 24 hr tablet, TAKE 1 TABLET BY MOUTH EVERYDAY AT BEDTIME, Disp: 90 tablet, Rfl: 1 .  ranitidine (ZANTAC) 150 MG capsule, Take 150 mg by mouth at bedtime., Disp: , Rfl:  .  Venlafaxine HCl 225 MG TB24, Take 1 tablet (225 mg total) by mouth daily., Disp: 90 each, Rfl: 2      Objective:   Physical Exam  Constitutional: She is oriented to person, place, and time. She appears well-developed and well-nourished.  HENT:  Head: Normocephalic and atraumatic.  Eyes: Pupils are equal, round, and reactive to light. EOM are normal.  Cardiovascular: Normal rate and regular rhythm.  Pulmonary/Chest: Effort normal. No respiratory distress.  She has no wheezes.  Musculoskeletal: She exhibits no edema.  Neurological: She is alert and oriented to person, place, and time.  Skin: Skin is warm. No erythema.   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  Left breast reduction for symmetry.   Would like to be done in [place].  We discussed the possibility of liposuction being needed in the future.  We agreed to wait on that part for if needed.

## 2017-10-23 ENCOUNTER — Telehealth: Payer: Self-pay | Admitting: Plastic Surgery

## 2017-10-23 NOTE — Telephone Encounter (Signed)
Patient called to inquire about surgery scheduling. Requested call back on 10/10

## 2017-10-29 ENCOUNTER — Ambulatory Visit: Payer: Medicare Other | Admitting: General Surgery

## 2017-10-29 ENCOUNTER — Encounter: Payer: Self-pay | Admitting: Plastic Surgery

## 2017-10-29 ENCOUNTER — Encounter: Payer: Self-pay | Admitting: General Surgery

## 2017-10-29 ENCOUNTER — Ambulatory Visit: Payer: Self-pay | Admitting: Plastic Surgery

## 2017-10-29 VITALS — BP 120/80 | HR 90 | Resp 14

## 2017-10-29 VITALS — BP 154/88 | HR 77 | Temp 97.9°F | Resp 14 | Ht 65.0 in | Wt 179.0 lb

## 2017-10-29 DIAGNOSIS — D0511 Intraductal carcinoma in situ of right breast: Secondary | ICD-10-CM

## 2017-10-29 DIAGNOSIS — N651 Disproportion of reconstructed breast: Secondary | ICD-10-CM | POA: Insufficient documentation

## 2017-10-29 DIAGNOSIS — Z9011 Acquired absence of right breast and nipple: Secondary | ICD-10-CM

## 2017-10-29 MED ORDER — CIPROFLOXACIN HCL 500 MG PO TABS: 500.0000 mg | ORAL_TABLET | Freq: Two times a day (BID) | ORAL | 0 refills | Status: DC

## 2017-10-29 MED ORDER — ONDANSETRON 4 MG PO TBDP: 4.0000 mg | ORAL_TABLET | Freq: Once | ORAL | Status: DC

## 2017-10-29 MED ORDER — TRAMADOL HCL 50 MG PO TABS: 50.0000 mg | ORAL_TABLET | Freq: Two times a day (BID) | ORAL | 0 refills | Status: AC | PRN

## 2017-10-29 NOTE — H&P (View-Only) (Signed)
Patient ID: Natasha Vasquez, female    DOB: 10-06-1940, 77 y.o.   MRN: [id number]   Chief Complaint  Patient presents with  . Breast Problem  . Breast Cancer    The patient is a 77 yrs old wf here with her husband for history and physical for secondary breast reconstruction.  She underwent a right mastectomy followed by expander placement.  She has done very well.  She now has 550 cc in a 535 cc expander and is ready to have it removed and the implant placed.  She would like to have a left breast mastopexy reduction for symmetry and this is very reasonable.  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  She is 5 feet 5 inches tall, weight is 178 pounds, preop bra is 30 8C/D.  She has grade 2 ptosis of the left breast.  She has not had any recent illnesses and is doing well.    Review of Systems  Constitutional: Negative for activity change and appetite change.  HENT: Negative.   Eyes: Negative.   Respiratory: Negative.   Cardiovascular: Negative.   Gastrointestinal: Negative.   Genitourinary: Negative.   Musculoskeletal: Negative.   Skin: Negative.  Negative for color change and wound.  Neurological: Negative.   Hematological: Negative.   Psychiatric/Behavioral: Negative.     Past Medical History:  Diagnosis Date  . Anemia in pregnancy   . Arthritis    hands  . Barrett esophagus   . Cancer ([place])    melanoma  . Complication of anesthesia    20 years ago pts b/p dropped really low  . Depression   . Depression   . Diabetes mellitus without complication (HCC)    diet controlled  . Family history of adverse reaction to anesthesia    son PONV  . GERD (gastroesophageal reflux disease)   . Hypercholesteremia   . Hyperlipidemia   . Hypertension   . MVP (mitral valve prolapse)    followed by PCP  . Nephritis   . Nephritis   . S/P appendectomy   . Torn meniscus    left    Past Surgical History:    Procedure Laterality Date  . ABDOMINAL HYSTERECTOMY    . APPENDECTOMY    . BREAST BIOPSY Right 04/25/2017   retroareolar 10:00   wing clip   path pending  . BREAST BIOPSY Right 04/25/2017   10:00  5CMFN  venus clip    path pending  . BREAST BIOPSY Right 05/03/2017   Affirm Bx- path pending  . BREAST CYST ASPIRATION Bilateral    NEG  . BREAST EXCISIONAL BIOPSY Left 20+ yrs ago   NEG  . BREAST RECONSTRUCTION WITH PLACEMENT OF TISSUE EXPANDER AND FLEX HD (ACELLULAR HYDRATED DERMIS) Right 06/03/2017   Procedure: BREAST RECONSTRUCTION WITH PLACEMENT OF TISSUE EXPANDER AND FLEX HD (ACELLULAR HYDRATED DERMIS);  Surgeon: Wallace Going, DO;  Location: [facility];  Service: Plastics;  Laterality: Right;  . BROW LIFT Bilateral 02/01/2015   Procedure: BLEPHAROPLASTY bilateral upper eyelids.;  Surgeon: Karle Starch, MD;  Location: [place];  Service: Ophthalmology;  Laterality: Bilateral;  DIABETIC - diet controlled  . CHOLECYSTECTOMY    . COLONOSCOPY    . COLONOSCOPY WITH PROPOFOL N/A 05/11/2015   Procedure: COLONOSCOPY WITH PROPOFOL;  Surgeon: Manya Silvas, MD;  Location: [facility];  Service: Endoscopy;  Laterality: N/A;  . ESOPHAGOGASTRODUODENOSCOPY    .  ESOPHAGOGASTRODUODENOSCOPY (EGD) WITH PROPOFOL  05/11/2015   Procedure: ESOPHAGOGASTRODUODENOSCOPY (EGD) WITH PROPOFOL;  Surgeon: Manya Silvas, MD;  Location: [facility] ENDOSCOPY;  Service: Endoscopy;;  . FRACTURE SURGERY Right    arm and shoulder  . JOINT REPLACEMENT    . KNEE ARTHROSCOPY Left 03/14/2016   Procedure: ARTHROSCOPY KNEE, PARTIAL MEDIAL MENISECTOMY, CHONDROPLASTY;  Surgeon: Dereck Leep, MD;  Location: [facility];  Service: Orthopedics;  Laterality: Left;  Marland Kitchen MASTECTOMY W/ SENTINEL NODE BIOPSY Right 06/03/2017   Procedure: MASTECTOMY WITH SENTINEL LYMPH NODE BIOPSY;  Surgeon: Robert Bellow, MD;  Location: [facility];  Service: General;  Laterality: Right;  . PARTIAL HIP ARTHROPLASTY Right   . TONSILLECTOMY         Current Outpatient Medications:  .  acetaminophen (TYLENOL) 500 MG tablet, Take 1,000 mg by mouth every 6 (six) hours as needed for mild pain. , Disp: , Rfl:  .  ARIPiprazole (ABILIFY) 2 MG tablet, TAKE 1 TABLET (2 MG TOTAL) BY MOUTH EVERY EVENING., Disp: 90 tablet, Rfl: 2 .  aspirin 81 MG tablet, Take 81 mg by mouth daily., Disp: , Rfl:  .  atorvastatin (LIPITOR) 20 MG tablet, Take 1 tablet (20 mg total) by mouth daily., Disp: 90 tablet, Rfl: 1 .  bismuth subsalicylate (PEPTO BISMOL) 262 MG chewable tablet, Chew 524 mg by mouth 3 (three) times daily as needed. , Disp: , Rfl:  .  calcium carbonate (TUMS SMOOTHIES) 750 MG chewable tablet, Chew 1 tablet by mouth 3 (three) times daily as needed for heartburn., Disp: , Rfl:  .  clindamycin (CLEOCIN) 150 MG capsule, Take 600 mg by mouth See admin instructions. TAKE 4 CAPSULES (600 MG) 1 HOUR PRIOR TO DENTAL APPOINTMENT, Disp: , Rfl:  .  lisinopril (PRINIVIL,ZESTRIL) 5 MG tablet, TAKE 1 TABLET BY MOUTH EVERY DAY, Disp: 90 tablet, Rfl: 1 .  Multiple Vitamin (MULTIVITAMIN WITH MINERALS) TABS tablet, Take 1 tablet by mouth daily. CENTRUM SILVER, Disp: , Rfl:  .  omeprazole (PRILOSEC) 40 MG capsule, Take 40 mg by mouth daily before breakfast., Disp: , Rfl:  .  oxybutynin (DITROPAN-XL) 10 MG 24 hr tablet, TAKE 1 TABLET BY MOUTH EVERYDAY AT BEDTIME, Disp: 90 tablet, Rfl: 1 .  ranitidine (ZANTAC) 150 MG capsule, Take 150 mg by mouth at bedtime., Disp: , Rfl:  .  Venlafaxine HCl 225 MG TB24, Take 1 tablet (225 mg total) by mouth daily., Disp: 90 each, Rfl: 2   Objective:   There were no vitals filed for this visit.  Physical Exam  Constitutional: She is oriented to person, place, and time. She appears well-developed and well-nourished.  HENT:  Head: Normocephalic and atraumatic.  Eyes: Pupils are equal, round, and reactive to light. EOM are normal.  Cardiovascular: Normal rate and regular rhythm.  Pulmonary/Chest: Effort normal. She exhibits no  tenderness.  Abdominal: Soft. She exhibits no distension.  Neurological: She is alert and oriented to person, place, and time.  Skin: Skin is warm. No erythema.  Psychiatric: She has a normal mood and affect. Her behavior is normal. Judgment and thought content normal.    Assessment & Plan:  Acquired absence of right breast  Ductal carcinoma in situ (DCIS) of right breast  Breast asymmetry following reconstructive surgery  Plan for right breast expander removal and implant placement and left mastopexy reduction for symmetry. The risks that can be encountered with and after placement of a breast implant placement and mastopexy / reduction were discussed and include the following but not limited  to these: bleeding, infection, delayed healing, anesthesia risks, skin sensation changes, injury to structures including nerves, blood vessels, and muscles which may be temporary or permanent, allergies to tape, suture materials and glues, blood products, topical preparations or injected agents, skin contour irregularities, skin discoloration and swelling, deep vein thrombosis, cardiac and pulmonary complications, pain, which may persist, fluid accumulation, wrinkling of the skin over the expander, changes in nipple or breast sensation, expander leakage or rupture, faulty position of the expander, persistent pain, formation of tight scar tissue around the expander (capsular contracture), possible need for revisional surgery or staged procedures.   [place], DO

## 2017-10-29 NOTE — Progress Notes (Addendum)
Patient ID: Natasha Vasquez, female    DOB: December 16, 1940, 77 y.o.   MRN: [id number]   Chief Complaint  Patient presents with  . Breast Problem  . Breast Cancer    The patient is a 77 yrs old wf here with her husband for history and physical for secondary breast reconstruction.  She underwent a right mastectomy followed by expander placement.  She has done very well.  She now has 550 cc in a 535 cc expander and is ready to have it removed and the implant placed.  She would like to have a left breast mastopexy reduction for symmetry and this is very reasonable.  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  She is 5 feet 5 inches tall, weight is 178 pounds, preop bra is 38 C/D.  She has grade 2 ptosis of the left breast.  She has not had any recent illnesses and is doing well.    Review of Systems  Constitutional: Negative for activity change and appetite change.  HENT: Negative.   Eyes: Negative.   Respiratory: Negative.   Cardiovascular: Negative.   Gastrointestinal: Negative.   Genitourinary: Negative.   Musculoskeletal: Negative.   Skin: Negative.  Negative for color change and wound.  Neurological: Negative.   Hematological: Negative.   Psychiatric/Behavioral: Negative.     Past Medical History:  Diagnosis Date  . Anemia in pregnancy   . Arthritis    hands  . Barrett esophagus   . Cancer ([place])    melanoma  . Complication of anesthesia    20 years ago pts b/p dropped really low  . Depression   . Depression   . Diabetes mellitus without complication (HCC)    diet controlled  . Family history of adverse reaction to anesthesia    son PONV  . GERD (gastroesophageal reflux disease)   . Hypercholesteremia   . Hyperlipidemia   . Hypertension   . MVP (mitral valve prolapse)    followed by PCP  . Nephritis   . Nephritis   . S/P appendectomy   . Torn meniscus    left    Past Surgical History:    Procedure Laterality Date  . ABDOMINAL HYSTERECTOMY    . APPENDECTOMY    . BREAST BIOPSY Right 04/25/2017   retroareolar 10:00   wing clip   path pending  . BREAST BIOPSY Right 04/25/2017   10:00  5CMFN  venus clip    path pending  . BREAST BIOPSY Right 05/03/2017   Affirm Bx- path pending  . BREAST CYST ASPIRATION Bilateral    NEG  . BREAST EXCISIONAL BIOPSY Left 20+ yrs ago   NEG  . BREAST RECONSTRUCTION WITH PLACEMENT OF TISSUE EXPANDER AND FLEX HD (ACELLULAR HYDRATED DERMIS) Right 06/03/2017   Procedure: BREAST RECONSTRUCTION WITH PLACEMENT OF TISSUE EXPANDER AND FLEX HD (ACELLULAR HYDRATED DERMIS);  Surgeon: Wallace Going, DO;  Location: [facility];  Service: Plastics;  Laterality: Right;  . BROW LIFT Bilateral 02/01/2015   Procedure: BLEPHAROPLASTY bilateral upper eyelids.;  Surgeon: Karle Starch, MD;  Location: [place];  Service: Ophthalmology;  Laterality: Bilateral;  DIABETIC - diet controlled  . CHOLECYSTECTOMY    . COLONOSCOPY    . COLONOSCOPY WITH PROPOFOL N/A 05/11/2015   Procedure: COLONOSCOPY WITH PROPOFOL;  Surgeon: Manya Silvas, MD;  Location: [facility] ENDOSCOPY;  Service: Endoscopy;  Laterality: N/A;  . ESOPHAGOGASTRODUODENOSCOPY    .  ESOPHAGOGASTRODUODENOSCOPY (EGD) WITH PROPOFOL  05/11/2015   Procedure: ESOPHAGOGASTRODUODENOSCOPY (EGD) WITH PROPOFOL;  Surgeon: Manya Silvas, MD;  Location: [facility] ENDOSCOPY;  Service: Endoscopy;;  . FRACTURE SURGERY Right    arm and shoulder  . JOINT REPLACEMENT    . KNEE ARTHROSCOPY Left 03/14/2016   Procedure: ARTHROSCOPY KNEE, PARTIAL MEDIAL MENISECTOMY, CHONDROPLASTY;  Surgeon: Dereck Leep, MD;  Location: [facility];  Service: Orthopedics;  Laterality: Left;  Marland Kitchen MASTECTOMY W/ SENTINEL NODE BIOPSY Right 06/03/2017   Procedure: MASTECTOMY WITH SENTINEL LYMPH NODE BIOPSY;  Surgeon: Robert Bellow, MD;  Location: [facility];  Service: General;  Laterality: Right;  . PARTIAL HIP ARTHROPLASTY Right   . TONSILLECTOMY         Current Outpatient Medications:  .  acetaminophen (TYLENOL) 500 MG tablet, Take 1,000 mg by mouth every 6 (six) hours as needed for mild pain. , Disp: , Rfl:  .  ARIPiprazole (ABILIFY) 2 MG tablet, TAKE 1 TABLET (2 MG TOTAL) BY MOUTH EVERY EVENING., Disp: 90 tablet, Rfl: 2 .  aspirin 81 MG tablet, Take 81 mg by mouth daily., Disp: , Rfl:  .  atorvastatin (LIPITOR) 20 MG tablet, Take 1 tablet (20 mg total) by mouth daily., Disp: 90 tablet, Rfl: 1 .  bismuth subsalicylate (PEPTO BISMOL) 262 MG chewable tablet, Chew 524 mg by mouth 3 (three) times daily as needed. , Disp: , Rfl:  .  calcium carbonate (TUMS SMOOTHIES) 750 MG chewable tablet, Chew 1 tablet by mouth 3 (three) times daily as needed for heartburn., Disp: , Rfl:  .  clindamycin (CLEOCIN) 150 MG capsule, Take 600 mg by mouth See admin instructions. TAKE 4 CAPSULES (600 MG) 1 HOUR PRIOR TO DENTAL APPOINTMENT, Disp: , Rfl:  .  lisinopril (PRINIVIL,ZESTRIL) 5 MG tablet, TAKE 1 TABLET BY MOUTH EVERY DAY, Disp: 90 tablet, Rfl: 1 .  Multiple Vitamin (MULTIVITAMIN WITH MINERALS) TABS tablet, Take 1 tablet by mouth daily. CENTRUM SILVER, Disp: , Rfl:  .  omeprazole (PRILOSEC) 40 MG capsule, Take 40 mg by mouth daily before breakfast., Disp: , Rfl:  .  oxybutynin (DITROPAN-XL) 10 MG 24 hr tablet, TAKE 1 TABLET BY MOUTH EVERYDAY AT BEDTIME, Disp: 90 tablet, Rfl: 1 .  ranitidine (ZANTAC) 150 MG capsule, Take 150 mg by mouth at bedtime., Disp: , Rfl:  .  Venlafaxine HCl 225 MG TB24, Take 1 tablet (225 mg total) by mouth daily., Disp: 90 each, Rfl: 2   Objective:   There were no vitals filed for this visit.  Physical Exam  Constitutional: She is oriented to person, place, and time. She appears well-developed and well-nourished.  HENT:  Head: Normocephalic and atraumatic.  Eyes: Pupils are equal, round, and reactive to light. EOM are normal.  Cardiovascular: Normal rate and regular rhythm.  Pulmonary/Chest: Effort normal. She exhibits no  tenderness.  Abdominal: Soft. She exhibits no distension.  Neurological: She is alert and oriented to person, place, and time.  Skin: Skin is warm. No erythema.  Psychiatric: She has a normal mood and affect. Her behavior is normal. Judgment and thought content normal.    Assessment & Plan:  Acquired absence of right breast  Ductal carcinoma in situ (DCIS) of right breast  Breast asymmetry following reconstructive surgery  Plan for right breast expander removal and implant placement and left mastopexy reduction for symmetry. The risks that can be encountered with and after placement of a breast implant placement and mastopexy / reduction were discussed and include the following but not limited  to these: bleeding, infection, delayed healing, anesthesia risks, skin sensation changes, injury to structures including nerves, blood vessels, and muscles which may be temporary or permanent, allergies to tape, suture materials and glues, blood products, topical preparations or injected agents, skin contour irregularities, skin discoloration and swelling, deep vein thrombosis, cardiac and pulmonary complications, pain, which may persist, fluid accumulation, wrinkling of the skin over the expander, changes in nipple or breast sensation, expander leakage or rupture, faulty position of the expander, persistent pain, formation of tight scar tissue around the expander (capsular contracture), possible need for revisional surgery or staged procedures.   [place], DO

## 2017-10-29 NOTE — Patient Instructions (Addendum)
The patient is aware to call back for any questions or new concerns.  Patient will be asked to return to the office in 6 months with a left screening mammogram

## 2017-10-29 NOTE — Progress Notes (Addendum)
Patient ID: Natasha Vasquez, female   DOB: 1940-09-26, 77 y.o.   MRN: [id number]  Chief Complaint  Patient presents with  . Follow-up    HPI Natasha Vasquez is a 77 y.o. female.  Follow up DCIS right breast. She plans on having breast symmetry reconstruction on 11-06-17 with Dr Marla Roe. Right mastectomy with expander placement was Jun 03, 2017. She is here with her husband, Sonia Side. He has been suffering from ankle pain. 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   HPI  Past Medical History:  Diagnosis Date  . Anemia in pregnancy   . Arthritis    hands  . Barrett esophagus   . 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  Marland Kitchen Cancer ([place])    melanoma  . Complication of anesthesia    20 years ago pts b/p dropped really low  . Depression   . Depression   . Diabetes mellitus without complication (HCC)    diet controlled  . Family history of adverse reaction to anesthesia    son PONV  . GERD (gastroesophageal reflux disease)   . Hypercholesteremia   . Hyperlipidemia   . Hypertension   . MVP (mitral valve prolapse)    followed by PCP  . Nephritis   . Nephritis   . S/P appendectomy   . Torn meniscus    left    Past Surgical History:  Procedure Laterality Date  . ABDOMINAL HYSTERECTOMY    . APPENDECTOMY    . BREAST BIOPSY Right 04/25/2017   retroareolar 10:00   wing clip   path pending  . BREAST BIOPSY Right 04/25/2017   10:00  5CMFN  venus clip    path pending  . BREAST BIOPSY Right 05/03/2017   Affirm Bx- path pending  . BREAST CYST ASPIRATION Bilateral    NEG  . BREAST EXCISIONAL BIOPSY Left 20+ yrs ago   NEG  . BREAST RECONSTRUCTION WITH PLACEMENT OF TISSUE EXPANDER AND FLEX HD (ACELLULAR HYDRATED DERMIS) Right 06/03/2017   Procedure: BREAST RECONSTRUCTION WITH PLACEMENT OF TISSUE EXPANDER AND FLEX HD (ACELLULAR HYDRATED DERMIS);  Surgeon: Wallace Going, DO;  Location: [facility];  Service: Plastics;   Laterality: Right;  . BROW LIFT Bilateral 02/01/2015   Procedure: BLEPHAROPLASTY bilateral upper eyelids.;  Surgeon: Karle Starch, MD;  Location: [place];  Service: Ophthalmology;  Laterality: Bilateral;  DIABETIC - diet controlled  . CHOLECYSTECTOMY    . COLONOSCOPY    . COLONOSCOPY WITH PROPOFOL N/A 05/11/2015   Procedure: COLONOSCOPY WITH PROPOFOL;  Surgeon: Manya Silvas, MD;  Location[facility] END[facility]COPY;  Service: Endoscopy;  Laterality: N/A;  . ESOPHAGOGASTRODUODENOSCOPY    . ESOPHAGOGASTRODUODENOSCOPY (EGD) WITH PROPOFOL  05/11/2015   Procedure: ESOPHAGOGASTRODUODENOSCOPY (EGD) WITH PROPOFOL;  Surgeon: Manya Silvas, MD;  Location: [facility] ENDOSCOPY;  Service: Endoscopy;;  . FRACTURE SURGERY Right    arm and shoulder  . JOINT REPLACEMENT    . KNEE ARTHROSCOPY Left 03/14/2016   Procedure: ARTHROSCOPY KNEE, PARTIAL MEDIAL MENISECTOMY, CHONDROPLASTY;  Surgeon: Dereck Leep, MD;  Location: [facility];  Service: Orthopedics;  Laterality: Left;  Marland Kitchen MASTECTOMY W/ SENTINEL NODE BIOPSY Right 06/03/2017   Procedure: MASTECTOMY WITH SENTINEL LYMPH NODE BIOPSY;  Surgeon: Robert Bellow, MD;  Location: [facility];  Service: General;  Laterality: Right;  . PARTIAL HIP ARTHROPLASTY Right   . TONSILLECTOMY      Family History  Problem Relation Age of Onset  . Cancer Mother  colon/rectal  . Colon cancer Mother   . Heart disease Father   . Hypertension Father   . Atrial fibrillation Father   . Diabetes Father   . Cancer Brother        lung  . Healthy Son   . Healthy Son   . Breast cancer Neg Hx     Social History Social History   Tobacco Use  . Smoking status: Former Smoker    Types: Cigarettes    Last attempt to quit: 1965    Years since quitting: 54.8  . Smokeless tobacco: Never Used  . Tobacco comment: smoked in college  Substance Use Topics  . Alcohol use: Yes    Alcohol/week: 7.0 - 14.0 standard drinks    Types: 7 - 14 Glasses of wine per week    Comment:  1-2 glasses of wine per night  . Drug use: No    Allergies  Allergen Reactions  . Codeine Other (See Comments)    Chest pain  . Lorazepam Other (See Comments)    "Feels loopy"  . Opium Other (See Comments)    Chest pain---tolerated MORPHINE  . Oxycodone Other (See Comments)    MENTAL CHANGES  . Penicillins     Has patient had a PCN reaction causing immediate rash, facial/tongue/throat swelling, SOB or lightheadedness with hypotension: Yes Has patient had a PCN reaction causing severe rash involving mucus membranes or skin necrosis: No Has patient had a PCN reaction that required hospitalization: NO Has patient had a PCN reaction occurring within the last 10 years: No If all of the above answers are "NO", then may proceed with Cephalosporin use.  Can take Augmentin  . Aleve [Naproxen Sodium] Hives, Rash and Other (See Comments)    headaches  . Naproxen Hives and Rash    And headaches    Current Outpatient Medications  Medication Sig Dispense Refill  . acetaminophen (TYLENOL) 500 MG tablet Take 1,000 mg by mouth every 6 (six) hours as needed for mild pain.     . ARIPiprazole (ABILIFY) 2 MG tablet TAKE 1 TABLET (2 MG TOTAL) BY MOUTH EVERY EVENING. 90 tablet 2  . aspirin 81 MG tablet Take 81 mg by mouth daily.    Marland Kitchen atorvastatin (LIPITOR) 20 MG tablet Take 1 tablet (20 mg total) by mouth daily. 90 tablet 1  . bismuth subsalicylate (PEPTO BISMOL) 262 MG chewable tablet Chew 524 mg by mouth 3 (three) times daily as needed.     . calcium carbonate (TUMS SMOOTHIES) 750 MG chewable tablet Chew 1 tablet by mouth 3 (three) times daily as needed for heartburn.    Marland Kitchen lisinopril (PRINIVIL,ZESTRIL) 5 MG tablet TAKE 1 TABLET BY MOUTH EVERY DAY 90 tablet 1  . Multiple Vitamin (MULTIVITAMIN WITH MINERALS) TABS tablet Take 1 tablet by mouth daily. CENTRUM SILVER    . omeprazole (PRILOSEC) 40 MG capsule Take 40 mg by mouth daily before breakfast.    . oxybutynin (DITROPAN-XL) 10 MG 24 hr tablet TAKE  1 TABLET BY MOUTH EVERYDAY AT BEDTIME 90 tablet 1  . ranitidine (ZANTAC) 150 MG capsule Take 150 mg by mouth at bedtime.    . Venlafaxine HCl 225 MG TB24 Take 1 tablet (225 mg total) by mouth daily. 90 each 2  . ciprofloxacin (CIPRO) 500 MG tablet Take 1 tablet (500 mg total) by mouth 2 (two) times daily. 6 tablet 0  . clindamycin (CLEOCIN) 150 MG capsule Take 600 mg by mouth See admin instructions. TAKE 4 CAPSULES (600  MG) 1 HOUR PRIOR TO DENTAL APPOINTMENT    . traMADol (ULTRAM) 50 MG tablet Take 1 tablet (50 mg total) by mouth every 12 (twelve) hours as needed for up to 3 days. 9 tablet 0   Current Facility-Administered Medications  Medication Dose Route Frequency Provider Last Rate Last Dose  . ondansetron (ZOFRAN-ODT) disintegrating tablet 4 mg  4 mg Oral Once [place], Loel Lofty, DO        Review of Systems Review of Systems  Constitutional: Negative.   Respiratory: Negative.   Cardiovascular: Negative.     Blood pressure (!) 154/88, pulse 77, temperature 97.9 F (36.6 C), temperature source Skin, resp. rate 14, height 5\' 5"  (1.651 m), weight 179 lb (81.2 kg), SpO2 94 %.  Physical Exam Physical Exam  Constitutional: She is oriented to person, place, and time. She appears well-developed and well-nourished.  HENT:  Mouth/Throat: Oropharynx is clear and moist.  Eyes: Conjunctivae are normal. No scleral icterus.  Neck: Neck supple.  Cardiovascular: Normal rate, regular rhythm and normal heart sounds.  Pulmonary/Chest: Effort normal and breath sounds normal. Right breast exhibits no mass, no skin change and no tenderness. Left breast exhibits no inverted nipple, no mass, no nipple discharge, no skin change and no tenderness.  Right mastectomy with expander in place, incision well healed.     Lymphadenopathy:    She has no cervical adenopathy.    She has no axillary adenopathy.  Neurological: She is alert and oriented to person, place, and time.  Skin: Skin is warm and dry.   Psychiatric: Her behavior is normal.    Data Reviewed April 14, 2017 screening mammograms reviewed with special attention of the left breast.  No discernible abnormality compared to prior studies.  Assessment    Doing well status post right mastectomy with placement of breast expander.  Pending placement of permanent prosthesis and symmetry procedure on the left breast next week.    Plan    Patient will be asked to return to the office in 6 months with a left screening mammogram. The patient is aware to call back for any questions or new concerns.      HPI, Physical Exam, Assessment and Plan have been scribed under the direction and in the presence of Robert Bellow, MD. Karie Fetch, RN  I have completed the exam and reviewed the above documentation for accuracy and completeness.  I agree with the above.  Haematologist has been used and any errors in dictation or transcription are unintentional.  Hervey Ard, M.D., F.A.C.S.  Forest Gleason Byrnett 10/29/2017, 8:59 PM

## 2017-10-31 ENCOUNTER — Telehealth: Payer: Self-pay | Admitting: Plastic Surgery

## 2017-10-31 NOTE — Telephone Encounter (Signed)
Patient called asking when she should start prescribed medication. Patient was unsure if medication needed to be taken before or after her surgery. Patient also wondering if she should stop taking her aspirin regimen prior to surgery.

## 2017-11-04 ENCOUNTER — Encounter
Admission: RE | Admit: 2017-11-04 | Discharge: 2017-11-04 | Disposition: A | Discharge status: Home / Self Care | Payer: Medicare Other | Source: Ambulatory Visit | Attending: Plastic Surgery | Admitting: Plastic Surgery

## 2017-11-04 ENCOUNTER — Other Ambulatory Visit: Payer: Self-pay

## 2017-11-04 DIAGNOSIS — M19041 Primary osteoarthritis, right hand: Secondary | ICD-10-CM | POA: Diagnosis not present

## 2017-11-04 DIAGNOSIS — E785 Hyperlipidemia, unspecified: Secondary | ICD-10-CM | POA: Insufficient documentation

## 2017-11-04 DIAGNOSIS — I341 Nonrheumatic mitral (valve) prolapse: Secondary | ICD-10-CM | POA: Diagnosis not present

## 2017-11-04 DIAGNOSIS — I10 Essential (primary) hypertension: Secondary | ICD-10-CM

## 2017-11-04 DIAGNOSIS — N651 Disproportion of reconstructed breast: Secondary | ICD-10-CM | POA: Diagnosis present

## 2017-11-04 DIAGNOSIS — Z9011 Acquired absence of right breast and nipple: Secondary | ICD-10-CM | POA: Insufficient documentation

## 2017-11-04 DIAGNOSIS — C50911 Malignant neoplasm of unspecified site of right female breast: Secondary | ICD-10-CM | POA: Insufficient documentation

## 2017-11-04 DIAGNOSIS — E119 Type 2 diabetes mellitus without complications: Secondary | ICD-10-CM | POA: Insufficient documentation

## 2017-11-04 DIAGNOSIS — Z90710 Acquired absence of both cervix and uterus: Secondary | ICD-10-CM | POA: Insufficient documentation

## 2017-11-04 DIAGNOSIS — N6082 Other benign mammary dysplasias of left breast: Secondary | ICD-10-CM | POA: Diagnosis not present

## 2017-11-04 DIAGNOSIS — Z853 Personal history of malignant neoplasm of breast: Secondary | ICD-10-CM | POA: Diagnosis not present

## 2017-11-04 DIAGNOSIS — E039 Hypothyroidism, unspecified: Secondary | ICD-10-CM | POA: Diagnosis not present

## 2017-11-04 DIAGNOSIS — M549 Dorsalgia, unspecified: Secondary | ICD-10-CM | POA: Diagnosis not present

## 2017-11-04 DIAGNOSIS — Z79899 Other long term (current) drug therapy: Secondary | ICD-10-CM

## 2017-11-04 DIAGNOSIS — E7800 Pure hypercholesterolemia, unspecified: Secondary | ICD-10-CM | POA: Diagnosis not present

## 2017-11-04 DIAGNOSIS — Z9889 Other specified postprocedural states: Secondary | ICD-10-CM

## 2017-11-04 DIAGNOSIS — N6022 Fibroadenosis of left breast: Secondary | ICD-10-CM | POA: Diagnosis not present

## 2017-11-04 DIAGNOSIS — M542 Cervicalgia: Secondary | ICD-10-CM | POA: Diagnosis not present

## 2017-11-04 DIAGNOSIS — Z85820 Personal history of malignant melanoma of skin: Secondary | ICD-10-CM | POA: Diagnosis not present

## 2017-11-04 DIAGNOSIS — F329 Major depressive disorder, single episode, unspecified: Secondary | ICD-10-CM | POA: Diagnosis not present

## 2017-11-04 DIAGNOSIS — Z7982 Long term (current) use of aspirin: Secondary | ICD-10-CM | POA: Insufficient documentation

## 2017-11-04 DIAGNOSIS — N6042 Mammary duct ectasia of left breast: Secondary | ICD-10-CM | POA: Diagnosis not present

## 2017-11-04 DIAGNOSIS — N62 Hypertrophy of breast: Secondary | ICD-10-CM | POA: Diagnosis not present

## 2017-11-04 DIAGNOSIS — Z01812 Encounter for preprocedural laboratory examination: Secondary | ICD-10-CM | POA: Insufficient documentation

## 2017-11-04 DIAGNOSIS — N641 Fat necrosis of breast: Secondary | ICD-10-CM | POA: Diagnosis not present

## 2017-11-04 DIAGNOSIS — Z880 Allergy status to penicillin: Secondary | ICD-10-CM | POA: Diagnosis not present

## 2017-11-04 DIAGNOSIS — M19042 Primary osteoarthritis, left hand: Secondary | ICD-10-CM | POA: Diagnosis not present

## 2017-11-04 DIAGNOSIS — K219 Gastro-esophageal reflux disease without esophagitis: Secondary | ICD-10-CM | POA: Diagnosis not present

## 2017-11-04 DIAGNOSIS — D242 Benign neoplasm of left breast: Secondary | ICD-10-CM | POA: Diagnosis not present

## 2017-11-04 LAB — CBC
HEMATOCRIT: 33.8 % — AB (ref 36.0–46.0)
HEMOGLOBIN: 9.6 g/dL — AB (ref 12.0–15.0)
MCH: 21 pg — AB (ref 26.0–34.0)
MCHC: 28.4 g/dL — AB (ref 30.0–36.0)
MCV: 74 fL — ABNORMAL LOW (ref 80.0–100.0)
Platelets: 449 10*3/uL — ABNORMAL HIGH (ref 150–400)
RBC: 4.57 MIL/uL (ref 3.87–5.11)
RDW: 18 % — ABNORMAL HIGH (ref 11.5–15.5)
WBC: 6.9 10*3/uL (ref 4.0–10.5)
nRBC: 0 % (ref 0.0–0.2)

## 2017-11-04 NOTE — Patient Instructions (Signed)
  Your procedure is scheduled on: Wednesday November 06, 2017 Report to Same Day Surgery 2nd floor [facility] Entrance-take elevator on left to 2nd floor.  Check in with surgery information desk.) To find out your arrival time, call [phone] 1:00-3:00 PM on Tuesday November 05, 2017  Remember: Instructions that are not followed completely may result in serious medical risk, up to and including death, or upon the discretion of your surgeon and anesthesiologist your surgery may need to be rescheduled.    __x__ 1. Do not eat food (including mints, candies, chewing gum) after midnight the night before your procedure. You may drink water up to 2 hours before you are scheduled to arrive at the hospital for your procedure.  Do not drink anything within 2 hours of your scheduled arrival to the hospital.    __x__ 2. No Alcohol for 24 hours before or after surgery.   __x__ 3. No Smoking or e-cigarettes for 24 hours before surgery.  Do not use any chewable tobacco products for at least 6 hours before surgery.   __x__ 4. Notify your doctor if there is any change in your medical condition (cold, fever, infections).   __x__ 5. On the morning of surgery brush your teeth with toothpaste and water.  You may rinse your mouth with mouthwash if you wish.  Do not swallow any toothpaste or mouthwash.  Please read over the following fact sheets that you were given:   [facility] Preparing for Surgery and/or MRSA Information    __x__ Use CHG Soap or Sage wipes as directed on instruction sheet.   Do not wear jewelry, make-up, hairpins, clips or nail polish on the day of surgery.  Do not wear lotions, powders, deodorant, or perfumes.   Do not shave below the face/neck 48 hours prior to surgery.   Do not bring valuables to the hospital.    [facility] is not responsible for any belongings or valuables.               Contacts, dentures or bridgework may not be worn into surgery.  For patients  discharged on the day of surgery, you will NOT be permitted to drive yourself home.  You must have a responsible adult with you for 24 hours after surgery.  __x__ Take anti-hypertensive listed below, cardiac, seizure, asthma, anti-reflux and psychiatric medicines on the morning of surgery with a small sip of water. These include:  1. Omeprazole (Prilosec)  2. Venlafaxine (Effexor)  3. Atorvastatin (Lipitor)   Do NOT take your Lisinopril (Prinivil) on the day of surgery.  __x__ Follow recommendations from Cardiologist, Pulmonologist or PCP regarding stopping Aspirin, Coumadin, Plavix, Eliquis, Effient, Pradaxa, and Pletal.  __x__ TODAY: Stop Anti-inflammatories such as aspirin, Advil, Ibuprofen, Motrin, Aleve, Naproxen, Naprosyn, BC/Goodies powders or aspirin-containing products. You may continue to take Tylenol and Celebrex.   __x__ TODAY: Stop supplements until after surgery. You may continue to take Vitamin D, Vitamin B, and multivitamin.

## 2017-11-04 NOTE — Pre-Procedure Instructions (Signed)
Secure message sent to Dr. Marla Roe re: CBC results from today's blood draw and no preop orders at time of PAT visit.  Dr. Marla Roe confirmed receipt.

## 2017-11-05 MED ORDER — CIPROFLOXACIN IN D5W 400 MG/200ML IV SOLN
400.0000 mg | INTRAVENOUS | Status: AC
  Administered 2017-11-06: 400 mg via INTRAVENOUS

## 2017-11-06 ENCOUNTER — Ambulatory Visit
Admission: RE | Admit: 2017-11-06 | Discharge: 2017-11-06 | Disposition: A | Discharge status: Home / Self Care | Payer: Medicare Other | Source: Ambulatory Visit | Attending: Plastic Surgery | Admitting: Plastic Surgery

## 2017-11-06 ENCOUNTER — Ambulatory Visit: Payer: Medicare Other | Admitting: Anesthesiology

## 2017-11-06 ENCOUNTER — Encounter
Admission: RE | Disposition: A | Discharge status: Home / Self Care | Payer: Self-pay | Source: Ambulatory Visit | Attending: Plastic Surgery

## 2017-11-06 ENCOUNTER — Encounter: Payer: Self-pay | Admitting: *Deleted

## 2017-11-06 ENCOUNTER — Ambulatory Visit: Admit: 2017-11-06 | Payer: Medicare Other | Admitting: Plastic Surgery

## 2017-11-06 ENCOUNTER — Ambulatory Visit: Payer: Medicare Other

## 2017-11-06 ENCOUNTER — Other Ambulatory Visit: Payer: Self-pay

## 2017-11-06 DIAGNOSIS — Z853 Personal history of malignant neoplasm of breast: Secondary | ICD-10-CM | POA: Insufficient documentation

## 2017-11-06 DIAGNOSIS — Z886 Allergy status to analgesic agent status: Secondary | ICD-10-CM | POA: Insufficient documentation

## 2017-11-06 DIAGNOSIS — K219 Gastro-esophageal reflux disease without esophagitis: Secondary | ICD-10-CM | POA: Insufficient documentation

## 2017-11-06 DIAGNOSIS — M549 Dorsalgia, unspecified: Secondary | ICD-10-CM | POA: Insufficient documentation

## 2017-11-06 DIAGNOSIS — I341 Nonrheumatic mitral (valve) prolapse: Secondary | ICD-10-CM | POA: Insufficient documentation

## 2017-11-06 DIAGNOSIS — M542 Cervicalgia: Secondary | ICD-10-CM | POA: Insufficient documentation

## 2017-11-06 DIAGNOSIS — M19041 Primary osteoarthritis, right hand: Secondary | ICD-10-CM | POA: Insufficient documentation

## 2017-11-06 DIAGNOSIS — Z85820 Personal history of malignant melanoma of skin: Secondary | ICD-10-CM | POA: Insufficient documentation

## 2017-11-06 DIAGNOSIS — N651 Disproportion of reconstructed breast: Secondary | ICD-10-CM | POA: Insufficient documentation

## 2017-11-06 DIAGNOSIS — N6082 Other benign mammary dysplasias of left breast: Secondary | ICD-10-CM | POA: Insufficient documentation

## 2017-11-06 DIAGNOSIS — R7981 Abnormal blood-gas level: Secondary | ICD-10-CM

## 2017-11-06 DIAGNOSIS — Z87891 Personal history of nicotine dependence: Secondary | ICD-10-CM | POA: Insufficient documentation

## 2017-11-06 DIAGNOSIS — N6042 Mammary duct ectasia of left breast: Secondary | ICD-10-CM | POA: Insufficient documentation

## 2017-11-06 DIAGNOSIS — D242 Benign neoplasm of left breast: Secondary | ICD-10-CM | POA: Insufficient documentation

## 2017-11-06 DIAGNOSIS — I10 Essential (primary) hypertension: Secondary | ICD-10-CM | POA: Insufficient documentation

## 2017-11-06 DIAGNOSIS — E7800 Pure hypercholesterolemia, unspecified: Secondary | ICD-10-CM | POA: Insufficient documentation

## 2017-11-06 DIAGNOSIS — E119 Type 2 diabetes mellitus without complications: Secondary | ICD-10-CM | POA: Insufficient documentation

## 2017-11-06 DIAGNOSIS — N641 Fat necrosis of breast: Secondary | ICD-10-CM | POA: Insufficient documentation

## 2017-11-06 DIAGNOSIS — Z880 Allergy status to penicillin: Secondary | ICD-10-CM | POA: Insufficient documentation

## 2017-11-06 DIAGNOSIS — Z888 Allergy status to other drugs, medicaments and biological substances status: Secondary | ICD-10-CM | POA: Insufficient documentation

## 2017-11-06 DIAGNOSIS — Z885 Allergy status to narcotic agent status: Secondary | ICD-10-CM | POA: Insufficient documentation

## 2017-11-06 DIAGNOSIS — N6022 Fibroadenosis of left breast: Secondary | ICD-10-CM | POA: Insufficient documentation

## 2017-11-06 DIAGNOSIS — Z7982 Long term (current) use of aspirin: Secondary | ICD-10-CM | POA: Insufficient documentation

## 2017-11-06 DIAGNOSIS — E039 Hypothyroidism, unspecified: Secondary | ICD-10-CM | POA: Insufficient documentation

## 2017-11-06 DIAGNOSIS — Z9011 Acquired absence of right breast and nipple: Secondary | ICD-10-CM | POA: Insufficient documentation

## 2017-11-06 DIAGNOSIS — Z79899 Other long term (current) drug therapy: Secondary | ICD-10-CM | POA: Insufficient documentation

## 2017-11-06 DIAGNOSIS — F329 Major depressive disorder, single episode, unspecified: Secondary | ICD-10-CM | POA: Insufficient documentation

## 2017-11-06 DIAGNOSIS — N62 Hypertrophy of breast: Secondary | ICD-10-CM | POA: Diagnosis not present

## 2017-11-06 DIAGNOSIS — M19042 Primary osteoarthritis, left hand: Secondary | ICD-10-CM | POA: Insufficient documentation

## 2017-11-06 HISTORY — PX: BREAST REDUCTION SURGERY: SHX8

## 2017-11-06 HISTORY — PX: REMOVAL OF BILATERAL TISSUE EXPANDERS WITH PLACEMENT OF BILATERAL BREAST IMPLANTS: SHX6431

## 2017-11-06 LAB — GLUCOSE, CAPILLARY: Glucose-Capillary: 102 mg/dL — ABNORMAL HIGH (ref 70–99)

## 2017-11-06 SURGERY — REMOVAL, TISSUE EXPANDER, BREAST, BILATERAL, WITH BILATERAL IMPLANT IMPLANT INSERTION
Anesthesia: General | Laterality: Right

## 2017-11-06 SURGERY — REMOVAL, TISSUE EXPANDER, BREAST, WITH IMPLANT INSERTION
Anesthesia: General | Laterality: Right

## 2017-11-06 MED ORDER — FENTANYL CITRATE (PF) 100 MCG/2ML IJ SOLN
25.0000 ug | INTRAMUSCULAR | Status: DC | PRN
  Administered 2017-11-06 (×3): 25 ug via INTRAVENOUS
  Administered 2017-11-06: 50 ug via INTRAVENOUS

## 2017-11-06 MED ORDER — DEXAMETHASONE SODIUM PHOSPHATE 10 MG/ML IJ SOLN
INTRAMUSCULAR | Status: AC
  Filled 2017-11-06: qty 1

## 2017-11-06 MED ORDER — PHENYLEPHRINE HCL 10 MG/ML IJ SOLN
INTRAMUSCULAR | Status: DC | PRN
  Administered 2017-11-06: 100 ug via INTRAVENOUS

## 2017-11-06 MED ORDER — NEOMYCIN-POLYMYXIN B GU 40-200000 IR SOLN
Status: AC
  Filled 2017-11-06: qty 20

## 2017-11-06 MED ORDER — IPRATROPIUM-ALBUTEROL 0.5-2.5 (3) MG/3ML IN SOLN
RESPIRATORY_TRACT | Status: AC
  Administered 2017-11-06: 3 mL
  Filled 2017-11-06: qty 3

## 2017-11-06 MED ORDER — ACETAMINOPHEN 160 MG/5ML PO SOLN: 325.0000 mg | ORAL | Status: DC | PRN

## 2017-11-06 MED ORDER — ACETAMINOPHEN 325 MG PO TABS: 325.0000 mg | ORAL_TABLET | ORAL | Status: DC | PRN

## 2017-11-06 MED ORDER — PROMETHAZINE HCL 25 MG/ML IJ SOLN: 6.2500 mg | INTRAMUSCULAR | Status: DC | PRN

## 2017-11-06 MED ORDER — ACETAMINOPHEN 10 MG/ML IV SOLN
INTRAVENOUS | Status: DC | PRN
  Administered 2017-11-06: 1000 mg via INTRAVENOUS

## 2017-11-06 MED ORDER — LIDOCAINE HCL (CARDIAC) PF 100 MG/5ML IV SOSY
PREFILLED_SYRINGE | INTRAVENOUS | Status: DC | PRN
  Administered 2017-11-06: 50 mg via INTRAVENOUS

## 2017-11-06 MED ORDER — DEXMEDETOMIDINE HCL IN NACL 200 MCG/50ML IV SOLN
INTRAVENOUS | Status: AC
  Filled 2017-11-06: qty 50

## 2017-11-06 MED ORDER — ACETAMINOPHEN 10 MG/ML IV SOLN
INTRAVENOUS | Status: AC
  Filled 2017-11-06: qty 100

## 2017-11-06 MED ORDER — NEOMYCIN-POLYMYXIN B GU 40-200000 IR SOLN
Status: DC | PRN
  Administered 2017-11-06: 4 mL

## 2017-11-06 MED ORDER — BUPIVACAINE-EPINEPHRINE (PF) 0.25% -1:200000 IJ SOLN
INTRAMUSCULAR | Status: AC
  Filled 2017-11-06: qty 30

## 2017-11-06 MED ORDER — IPRATROPIUM-ALBUTEROL 0.5-2.5 (3) MG/3ML IN SOLN: 3.0000 mL | Freq: Once | RESPIRATORY_TRACT | Status: DC

## 2017-11-06 MED ORDER — DEXMEDETOMIDINE HCL 200 MCG/2ML IV SOLN
INTRAVENOUS | Status: DC | PRN
  Administered 2017-11-06: 4 ug via INTRAVENOUS
  Administered 2017-11-06: 8 ug via INTRAVENOUS

## 2017-11-06 MED ORDER — CIPROFLOXACIN IN D5W 400 MG/200ML IV SOLN
INTRAVENOUS | Status: AC
  Filled 2017-11-06: qty 200

## 2017-11-06 MED ORDER — FENTANYL CITRATE (PF) 100 MCG/2ML IJ SOLN
INTRAMUSCULAR | Status: AC
  Administered 2017-11-06: 25 ug via INTRAVENOUS
  Filled 2017-11-06: qty 2

## 2017-11-06 MED ORDER — MEPERIDINE HCL 50 MG/ML IJ SOLN: 6.2500 mg | INTRAMUSCULAR | Status: DC | PRN

## 2017-11-06 MED ORDER — LIDOCAINE HCL (PF) 2 % IJ SOLN
INTRAMUSCULAR | Status: AC
  Filled 2017-11-06: qty 10

## 2017-11-06 MED ORDER — FENTANYL CITRATE (PF) 100 MCG/2ML IJ SOLN
INTRAMUSCULAR | Status: DC | PRN
  Administered 2017-11-06: 25 ug via INTRAVENOUS
  Administered 2017-11-06: 50 ug via INTRAVENOUS
  Administered 2017-11-06: 25 ug via INTRAVENOUS
  Administered 2017-11-06: 50 ug via INTRAVENOUS

## 2017-11-06 MED ORDER — FENTANYL CITRATE (PF) 100 MCG/2ML IJ SOLN
INTRAMUSCULAR | Status: AC
  Filled 2017-11-06: qty 2

## 2017-11-06 MED ORDER — EPHEDRINE SULFATE 50 MG/ML IJ SOLN
INTRAMUSCULAR | Status: DC | PRN
  Administered 2017-11-06: 10 mg via INTRAVENOUS
  Administered 2017-11-06: 20 mg via INTRAVENOUS
  Administered 2017-11-06 (×2): 10 mg via INTRAVENOUS

## 2017-11-06 MED ORDER — PROPOFOL 10 MG/ML IV BOLUS
INTRAVENOUS | Status: DC | PRN
  Administered 2017-11-06: 150 mg via INTRAVENOUS
  Administered 2017-11-06: 50 mg via INTRAVENOUS

## 2017-11-06 MED ORDER — ONDANSETRON HCL 4 MG/2ML IJ SOLN
INTRAMUSCULAR | Status: AC
  Filled 2017-11-06: qty 2

## 2017-11-06 MED ORDER — SODIUM CHLORIDE 0.9 % IV SOLN
INTRAVENOUS | Status: DC
  Administered 2017-11-06: 13:00:00 via INTRAVENOUS

## 2017-11-06 MED ORDER — DEXAMETHASONE SODIUM PHOSPHATE 10 MG/ML IJ SOLN
INTRAMUSCULAR | Status: DC | PRN
  Administered 2017-11-06: 5 mg via INTRAVENOUS

## 2017-11-06 MED ORDER — BUPIVACAINE-EPINEPHRINE 0.25% -1:200000 IJ SOLN
INTRAMUSCULAR | Status: DC | PRN
  Administered 2017-11-06: 25 mL

## 2017-11-06 MED ORDER — ONDANSETRON HCL 4 MG/2ML IJ SOLN
INTRAMUSCULAR | Status: DC | PRN
  Administered 2017-11-06: 4 mg via INTRAVENOUS

## 2017-11-06 MED ORDER — PROPOFOL 10 MG/ML IV BOLUS
INTRAVENOUS | Status: AC
  Filled 2017-11-06: qty 20

## 2017-11-06 SURGICAL SUPPLY — 61 items
BAG DECANTER FOR FLEXI CONT (MISCELLANEOUS) ×4 IMPLANT
BINDER BREAST LRG (GAUZE/BANDAGES/DRESSINGS) IMPLANT
BINDER BREAST MEDIUM (GAUZE/BANDAGES/DRESSINGS) IMPLANT
BINDER BREAST XLRG (GAUZE/BANDAGES/DRESSINGS) ×4 IMPLANT
BINDER BREAST XXLRG (GAUZE/BANDAGES/DRESSINGS) IMPLANT
BIOPATCH WHT 1IN DISK W/4.0 H (GAUZE/BANDAGES/DRESSINGS) IMPLANT
BLADE BOVIE TIP EXT 4 (BLADE) ×4 IMPLANT
BLADE SURG 15 STRL LF DISP TIS (BLADE) ×2 IMPLANT
BLADE SURG 15 STRL SS (BLADE) ×2
BNDG GAUZE 4.5X4.1 6PLY STRL (MISCELLANEOUS) ×8 IMPLANT
BULB RESERV EVAC DRAIN JP 100C (MISCELLANEOUS) IMPLANT
CANISTER SUCT 1200ML W/VALVE (MISCELLANEOUS) ×4 IMPLANT
CHLORAPREP W/TINT 26ML (MISCELLANEOUS) ×8 IMPLANT
DECANTER SPIKE VIAL GLASS SM (MISCELLANEOUS) ×4 IMPLANT
DERMABOND ADVANCED (GAUZE/BANDAGES/DRESSINGS) ×4
DERMABOND ADVANCED .7 DNX12 (GAUZE/BANDAGES/DRESSINGS) ×4 IMPLANT
DRAIN CHANNEL 19F RND (DRAIN) IMPLANT
ELECT CAUTERY BLADE 6.4 (BLADE) ×4 IMPLANT
ELECT CAUTERY BLADE TIP 2.5 (TIP) ×4
ELECTRODE CAUTERY BLDE TIP 2.5 (TIP) ×2 IMPLANT
GAUZE SPONGE 4X4 12PLY STRL (GAUZE/BANDAGES/DRESSINGS) ×4 IMPLANT
GLOVE BIO SURGEON STRL SZ 6.5 (GLOVE) ×9 IMPLANT
GLOVE BIO SURGEONS STRL SZ 6.5 (GLOVE) ×3
GOWN STRL REUS W/ TWL LRG LVL3 (GOWN DISPOSABLE) ×4 IMPLANT
GOWN STRL REUS W/TWL LRG LVL3 (GOWN DISPOSABLE) ×4
IMPL GEL HP 590CC (Breast) ×2 IMPLANT
IMPLANT GEL HP 590CC (Breast) ×4 IMPLANT
IV NS 1000ML (IV SOLUTION) ×2
IV NS 1000ML BAXH (IV SOLUTION) ×2 IMPLANT
IV NS 500ML (IV SOLUTION) ×2
IV NS 500ML BAXH (IV SOLUTION) ×2 IMPLANT
NEEDLE 21 GA WING INFUSION (NEEDLE) ×4 IMPLANT
NEEDLE HYPO 25X1 1.5 SAFETY (NEEDLE) IMPLANT
PACK BASIN MAJOR ARMC (MISCELLANEOUS) ×4 IMPLANT
PACK UNIVERSAL (MISCELLANEOUS) ×4 IMPLANT
PAD ABD DERMACEA PRESS 5X9 (GAUZE/BANDAGES/DRESSINGS) ×8 IMPLANT
PAD PREP 24X41 OB/GYN DISP (PERSONAL CARE ITEMS) IMPLANT
PIN SAFETY STRL (MISCELLANEOUS) IMPLANT
SET ASEPTIC TRANSFER (MISCELLANEOUS) IMPLANT
SIZER BREAST REUSE 590CC (SIZER) ×4
SIZER BRST REUSE 590CC (SIZER) ×2 IMPLANT
SPONGE LAP 18X18 RF (DISPOSABLE) ×12 IMPLANT
STRIP SUTURE WOUND CLOSURE 1/2 (SUTURE) ×8 IMPLANT
SUT MNCRL 3-0 UNDYED SH (SUTURE) ×8 IMPLANT
SUT MNCRL 4-0 (SUTURE) ×2
SUT MNCRL 4-0 27XMFL (SUTURE) ×2
SUT MNCRL AB 4-0 PS2 18 (SUTURE) ×4 IMPLANT
SUT MNCRL+ 5-0 UNDYED PC-3 (SUTURE) ×2 IMPLANT
SUT MON AB 3-0 SH 27 (SUTURE) IMPLANT
SUT MONOCRYL 3-0 UNDYED (SUTURE) ×8
SUT MONOCRYL 5-0 (SUTURE) ×2
SUT PDS AB 1 CT1 27 (SUTURE) IMPLANT
SUT PDS PLUS 2 (SUTURE)
SUT PDS PLUS AB 2-0 CT-1 (SUTURE) IMPLANT
SUT SILK 3 0 SH 30 (SUTURE) ×8 IMPLANT
SUT VIC AB 3-0 SH 27 (SUTURE)
SUT VIC AB 3-0 SH 27X BRD (SUTURE) IMPLANT
SUTURE MNCRL 4-0 27XMF (SUTURE) ×2 IMPLANT
SYR BULB IRRIG 60ML STRL (SYRINGE) ×4 IMPLANT
SYR CONTROL 10ML (SYRINGE) ×4 IMPLANT
TOWEL OR 17X26 4PK STRL BLUE (TOWEL DISPOSABLE) ×4 IMPLANT

## 2017-11-06 NOTE — Anesthesia Postprocedure Evaluation (Signed)
Anesthesia Post Note  Patient: Patt Steinhardt  Procedure(s) Performed: REMOVAL OF TISSUE EXPANDER WITH PLACEMENT OF BREAST IMPLANT (Right ) BREAST REDUCTION (Left )  Patient location during evaluation: PACU Anesthesia Type: General Level of consciousness: awake and alert Pain management: pain level controlled Vital Signs Assessment: post-procedure vital signs reviewed and stable Respiratory status: spontaneous breathing, nonlabored ventilation, respiratory function stable and patient connected to nasal cannula oxygen Cardiovascular status: blood pressure returned to baseline and stable Postop Assessment: no apparent nausea or vomiting Anesthetic complications: no     Last Vitals:  Vitals:   11/06/17 1721 11/06/17 1736  BP: 117/65 115/63  Pulse: 81 80  Resp:  20  Temp:  36.8 C  SpO2: 90% (!) 89%    Last Pain:  Vitals:   11/06/17 1736  TempSrc:   PainSc: 4                  Molli Barrows

## 2017-11-06 NOTE — Progress Notes (Signed)
Informed Dr. Andree Elk of xray results, Dr. Andree Elk states patient may be discharged from hospital to home. Specific instructions to return to ER is Shortness of breath,or difficulty breathing, patient and spouse verbalize understanding.

## 2017-11-06 NOTE — Op Note (Signed)
OPERATIVE REPORT    DATE OF OPERATION:  11/06/2017  LOCATION: [facility]  PREOPERATIVE DIAGNOSIS 1. History of right breast cancer.  2. Acquired absence of right breast.  3. Breast asymmetry of left after right breast reconstruction. 4. Macromastia, Neck Pain, Back Pain.  POSTOPERATIVE DIAGNOSIS 1. History of right breast cancer.  2. Acquired absence of right breast.  3. Breast asymmetry of left after right breast reconstruction. 4. Macromastia, Neck Pain, Back Pain.  PROCEDURE:  1. Exchange of right breast tissue expander for implant. 2. Right breast capsulotomies for implant respositioning. 3. Left breast mastopexy / reduction for symmetry (200 gm)  SURGEON: Jeannelle Wiens Sanger Ismaeel Arvelo, DO  ANESTHESIA:  General.   COMPLICATIONS: None.  IMPLANTS: [employer]or Smooth Round Ultra High Profile Gel 590cc. Ref #[id number].  Serial Number [id number]  INDICATIONS FOR PROCEDURE:  The patient, Natasha Vasquez, is a 77 y.o. female born on 1940-12-29, is here for treatment for further treatment after a mastectomy and placement of a tissue expander. She now presents for exchange of her expander for an implant.  She requires capsulotomies to better position the implant.  She now has significant breast asymmetry due to the right reconstruction.  Will plan for left mastopexy / reduction for better symmetry and relief of the pain due to the asymmetry. MRN: [id number]  CONSENT:  Informed consent was obtained directly from the patient. Risks, benefits and alternatives were fully discussed. Specific risks including but not limited to bleeding, infection, hematoma, seroma, scarring, pain, implant infection, implant extrusion, capsular contracture, asymmetry, wound healing problems, and need for further surgery were all discussed. The patient did have an ample opportunity to have her questions answered to her satisfaction.   DESCRIPTION OF PROCEDURE:  The patient was taken to the operating  room. SCDs were placed and IV antibiotics were given. The patient's chest was prepped and draped in a sterile fashion. A time out was performed and the implants to be used were identified.  One percent Xylocaine with epinephrine was used to infiltrate the area.   Right: The old mastectomy scar was opened and superior mastectomy and inferior mastectomy flaps were re-raised over the pectoralis major muscle. The pectoralis was split to expose the tissue expander which was removed. Inspection of the pocket showed a normal healthy capsule and good integration of the biologic matrix.   Circumferential capsulotomies were performed to allow for breast pocket expansion.  Measurements were made to confirm adequate pocket size for the implant dimensions.  Hemostasis was ensured with electrocautery.  The pocket was irrigated with antibiotic solution.  New gloves were placed.  The implant was placed in the pocket and oriented appropriately. The pectoralis major muscle and capsule on the anterior surface were re-closed with a 3-0 running Monocryl suture. There was excess skin and soft tissue laterally.  This was excised and sent with the mastectomy scar. The remaining skin was closed with 4-0 Monocryl deep dermal and 5-0 Monocryl subcuticular stitches.   Left: Preoperative markings were confirmed.  Incision lines were injected with 1% Xylocaine with epinephrine.  After waiting for vasoconstriction, the marked lines were incised.  A Wise-pattern superomedial breast reduction was performed by de-epithelializing the pedicle, using bovie to create the superomedial pedicle, and removing breast tissue from the superior, lateral, and inferior portions of the breast.  Care was taken to not undermine the breast pedicle. Hemostasis was achieved.  The nipple was gently rotated into position and the soft tissue was closed with 4-0 Monocryl.  The patient was  sat upright and size and shape symmetry was confirmed.  The pocket was  irrigated and hemostasis confirmed.  The deep tissues were approximated with 3-0 monocryl sutures and the skin was closed with deep dermal and subcuticular 4-0 Monocryl sutures.  Dermabond was applied.  A breast binder and ABDs were placed.  The nipple and skin flaps had good capillary refill at the end of the procedure.  The patient tolerated the procedure well. The patient was allowed to wake from anesthesia and taken to the recovery room in satisfactory condition

## 2017-11-06 NOTE — Anesthesia Preprocedure Evaluation (Signed)
Anesthesia Evaluation  Patient identified by MRN, date of birth, ID band Patient awake    Reviewed: Allergy & Precautions, H&P , NPO status , reviewed documented beta blocker date and time   History of Anesthesia Complications (+) Family history of anesthesia reaction and history of anesthetic complications  Airway Mallampati: II  TM Distance: >3 FB Neck ROM: full    Dental  (+) Chipped, Caps   Pulmonary former smoker,    Pulmonary exam normal        Cardiovascular hypertension, Normal cardiovascular exam     Neuro/Psych PSYCHIATRIC DISORDERS Depression  Neuromuscular disease    GI/Hepatic GERD  Medicated and Controlled,  Endo/Other  diabetesHypothyroidism   Renal/GU Renal disease     Musculoskeletal  (+) Arthritis ,   Abdominal   Peds  Hematology  (+) Blood dyscrasia, anemia ,   Anesthesia Other Findings Past Medical History: No date: Anemia in pregnancy No date: Arthritis     Comment:  hands No date: Barrett esophagus 06/03/2017: Breast neoplasm, Tis (DCIS), right     Comment:  36 mm area DCIS.  ER/PR NEGATIVE No date: Cancer ([place])     Comment:  melanoma No date: Complication of anesthesia     Comment:  20 years ago pts b/p dropped really low No date: Depression No date: Depression No date: Diabetes mellitus without complication (HCC)     Comment:  diet controlled No date: Family history of adverse reaction to anesthesia     Comment:  son PONV No date: GERD (gastroesophageal reflux disease) No date: Hypercholesteremia No date: Hyperlipidemia No date: Hypertension No date: MVP (mitral valve prolapse)     Comment:  followed by PCP No date: Nephritis No date: Nephritis No date: S/P appendectomy No date: Torn meniscus     Comment:  left Past Surgical History: No date: ABDOMINAL HYSTERECTOMY No date: APPENDECTOMY 04/25/2017: BREAST BIOPSY; Right     Comment:  retroareolar 10:00   wing clip   path  pending 04/25/2017: BREAST BIOPSY; Right     Comment:  10:00  5CMFN  venus clip    path pending 05/03/2017: BREAST BIOPSY; Right     Comment:  Affirm Bx- path pending No date: BREAST CYST ASPIRATION; Bilateral     Comment:  NEG 20+ yrs ago: BREAST EXCISIONAL BIOPSY; Left     Comment:  NEG 06/03/2017: BREAST RECONSTRUCTION WITH PLACEMENT OF TISSUE EXPANDER  AND FLEX HD (ACELLULAR HYDRATED DERMIS); Right     Comment:  Procedure: BREAST RECONSTRUCTION WITH PLACEMENT OF               TISSUE EXPANDER AND FLEX HD (ACELLULAR HYDRATED DERMIS);               Surgeon: Wallace Going, DO;  Location: [facility];                Service: Plastics;  Laterality: Right; 02/01/2015: BROW LIFT; Bilateral     Comment:  Procedure: BLEPHAROPLASTY bilateral upper eyelids.;                Surgeon: Karle Starch, MD;  Location: [place];  Service: Ophthalmology;  Laterality: Bilateral;                DIABETIC - diet controlled No date: CHOLECYSTECTOMY No date: COLONOSCOPY 05/11/2015: COLONOSCOPY WITH PROPOFOL; N/A     Comment:  Procedure: COLONOSCOPY WITH PROPOFOL;  Surgeon: Gavin Pound  Vira Agar, MD;  Location: [place] [facility];  Service:               Endoscopy;  Laterality: N/A; No date: ESOPHAGOGASTRODUODENOSCOPY 05/11/2015: ESOPHAGOGASTRODUODENOSCOPY (EGD) WITH PROPOFOL     Comment:  Procedure: ESOPHAGOGASTRODUODENOSCOPY (EGD) WITH               PROPOFOL;  Surgeon: Manya Silvas, MD;  Location: [facility]              ENDOSCOPY;  Service: Endoscopy;; No date: FRACTURE SURGERY; Right     Comment:  arm and shoulder No date: JOINT REPLACEMENT 03/14/2016: KNEE ARTHROSCOPY; Left     Comment:  Procedure: ARTHROSCOPY KNEE, PARTIAL MEDIAL MENISECTOMY,              CHONDROPLASTY;  Surgeon: Dereck Leep, MD;  Location:               [facility];  Service: Orthopedics;  Laterality: Left; 06/03/2017: MASTECTOMY W/ SENTINEL NODE BIOPSY; Right     Comment:  Procedure: MASTECTOMY WITH  SENTINEL LYMPH NODE BIOPSY;                Surgeon: Robert Bellow, MD;  Location: [facility];                Service: General;  Laterality: Right; No date: PARTIAL HIP ARTHROPLASTY; Right No date: TONSILLECTOMY BMI    Body Mass Index:  29.68 kg/m     Reproductive/Obstetrics                             Anesthesia Physical Anesthesia Plan  ASA: III  Anesthesia Plan: General LMA   Post-op Pain Management:    Induction: Intravenous  PONV Risk Score and Plan: 3 and Ondansetron, Treatment may vary due to age or medical condition, Midazolam and Metaclopromide  Airway Management Planned: LMA  Additional Equipment:   Intra-op Plan:   Post-operative Plan: Extubation in OR  Informed Consent: I have reviewed the patients History and Physical, chart, labs and discussed the procedure including the risks, benefits and alternatives for the proposed anesthesia with the patient or authorized representative who has indicated his/her understanding and acceptance.   Dental Advisory Given  Plan Discussed with: CRNA  Anesthesia Plan Comments:         Anesthesia Quick Evaluation

## 2017-11-06 NOTE — Transfer of Care (Signed)
Immediate Anesthesia Transfer of Care Note  Patient: Natasha Vasquez  Procedure(s) Performed: REMOVAL OF TISSUE EXPANDER WITH PLACEMENT OF BREAST IMPLANT (Right ) BREAST REDUCTION (Left )  Patient Location: PACU  Anesthesia Type:General  Level of Consciousness: drowsy and patient cooperative  Airway & Oxygen Therapy: Patient Spontanous Breathing and Patient connected to face mask oxygen  Post-op Assessment: Report given to RN and Post -op Vital signs reviewed and stable  Post vital signs: Reviewed and stable  Last Vitals:  Vitals Value Taken Time  BP 120/60 11/06/2017  3:38 PM  Temp 36.6 C 11/06/2017  3:38 PM  Pulse 74 11/06/2017  3:39 PM  Resp 12 11/06/2017  3:39 PM  SpO2 98 % 11/06/2017  3:39 PM  Vitals shown include unvalidated device data.  Last Pain:  Vitals:   11/06/17 1015  TempSrc: Tympanic  PainSc: 0-No pain         Complications: No apparent anesthesia complications

## 2017-11-06 NOTE — Anesthesia Procedure Notes (Signed)
Procedure Name: LMA Insertion Date/Time: 11/06/2017 12:48 PM Performed by: Jonna Clark, CRNA Pre-anesthesia Checklist: Patient identified, Patient being monitored, Timeout performed, Emergency Drugs available and Suction available Patient Re-evaluated:Patient Re-evaluated prior to induction Oxygen Delivery Method: Circle system utilized Preoxygenation: Pre-oxygenation with 100% oxygen Induction Type: IV induction Ventilation: Mask ventilation without difficulty LMA: LMA inserted LMA Size: 3.5 Tube type: Oral Number of attempts: 1 Placement Confirmation: positive ETCO2 and breath sounds checked- equal and bilateral Tube secured with: Tape Dental Injury: Teeth and Oropharynx as per pre-operative assessment

## 2017-11-06 NOTE — Anesthesia Post-op Follow-up Note (Signed)
Anesthesia QCDR form completed.        

## 2017-11-06 NOTE — Discharge Instructions (Addendum)
AMBULATORY SURGERY  DISCHARGE INSTRUCTIONS   1) The drugs that you were given will stay in your system until tomorrow so for the next 24 hours you should not:  A) Drive an automobile B) Make any legal decisions C) Drink any alcoholic beverage   2) You may resume regular meals tomorrow.  Today it is better to start with liquids and gradually work up to solid foods.  You may eat anything you prefer, but it is better to start with liquids, then soup and crackers, and gradually work up to solid foods.   3) Please notify your doctor immediately if you have any unusual bleeding, trouble breathing, redness and pain at the surgery site, drainage, fever, or pain not relieved by medication.    4) Additional Instructions:        Please contact your physician with any problems or Same Day Surgery at [phone], Monday through Friday 6 am to 4 pm, or [place] at [facility] number at [phone].INSTRUCTIONS FOR AFTER BREAST SURGERY   You are getting ready to undergo breast surgery.  You will likely have some questions about what to expect following your operation.  The following information will help you and your family understand what to expect when you are discharged from the hospital.  Following these guidelines will help ensure a smooth recovery and reduce risks of complications.   Postoperative instructions include information on: diet, wound care, medications and physical activity.  AFTER SURGERY Expect to go home after the procedure.  In some cases, you may need to spend one night in the hospital for observation.  DIET Breast surgery does not require a specific diet.  However, I have to mention that the healthier you eat the better your body can start healing. It is important to increasing your protein intake.  This means limiting the foods with sugar and carbohydrates.  Focus on vegetables and some meat.  If you have any liposuction during your procedure be sure to drink  water.  If your urine is bright yellow, then it is concentrated, and you need to drink more water.  As a general rule after surgery, you should have 8 ounces of water every hour while awake.  If you find you are persistently nauseated or unable to take in liquids let us know.  NO TOBACCO USE or EXPOSURE.  This will slow your healing process and increase the risk of a wound.  WOUND CARE You can shower the day after surgery if you don't have a drain.  Use fragrance free soap.  Dial, [place] and [place] are usually mild on the skin. If you have a drain clean with baby wipes until the drain is removed.  If you have steri-strips / tape directly attached to your skin leave them in place. It is OK to get these wet.  No baths, pools or hot tubs for two weeks. We close your incision to leave the smallest and best-looking scar. No ointment or creams on your incisions until given the go ahead.  Especially not Neosporin (Too many skin reactions with this one).  A few weeks after surgery you can use Mederma and start massaging the scar. We ask you to wear your binder or sports bra for the first 6 weeks around the clock, including while sleeping. This provides added comfort and helps reduce the fluid accumulation at the surgery site.  ACTIVITY No heavy lifting until cleared by the doctor.  This usually means no more than a half-gallon of milk.  It is OK to walk and climb stairs. In fact, moving your legs is very important to decrease your risk of a blood clot.  It will also help keep you from getting deconditioned.  Every 1 to 2 hours get up and walk for 5 minutes. This will help with a quicker recovery back to normal.  Let pain be your guide so you don't do too much.  NO, you cannot do the spring cleaning and don't plan on taking care of anyone else.  This is your time for TLC.  You will be more comfortable if you sleep and rest with your head elevated either with a few pillows under you or in a recliner.  No stomach  sleeping for a few months.  WORK Everyone returns to work at different times. As a rough guide, most people take at least 1 - 2 weeks off prior to returning to work. If you need documentation for your job, bring the forms to your postoperative follow up visit.  DRIVING Arrange for someone to bring you home from the hospital.  You may be able to drive a few days after surgery but not while taking any narcotics or valium.  BOWEL MOVEMENTS Constipation can occur after anesthesia and while taking pain medication.  It is important to stay ahead for your comfort.  We recommend taking Milk of Magnesia (2 tablespoons; twice a day) while taking the pain pills.  SEROMA This is fluid your body tried to put in the surgical site.  This is normal but if it creates tight skinny skin let us know.  It usually decreases in a few weeks.  WHEN TO CALL Call your surgeon's office if any of the following occur:  Fever 101 degrees F or greater  Excessive bleeding or fluid from the incision site.  Pain that increases over time without aid from the medications  Redness, warmth, or pus draining from incision sites  Persistent nausea or inability to take in liquids  Severe misshapen area that underwent the operation.  Here are some resources:  1. Plastic surgery website: https://www.plasticsurgery.org/for-medical-professionals/education-and-resources/publications/breast-reconstruction-magazine 2. Breast Reconstruction Awareness Campaign:  HotelLives.co[employer]nz 3. Plastic surgery Implant information:  https://www.plasticsurgery.org/patient-safety/breast-implant-safety

## 2017-11-06 NOTE — Interval H&P Note (Signed)
History and Physical Interval Note:  11/06/2017 10:26 AM  Natasha Vasquez  has presented today for surgery, with the diagnosis of B  The various methods of treatment have been discussed with the patient and family. After consideration of risks, benefits and other options for treatment, the patient has consented to  Procedure(s): REMOVAL OF TISSUE EXPANDER WITH PLACEMENT OF BREAST IMPLANT (Right) BREAST REDUCTION (Left) as a surgical intervention .  The patient's history has been reviewed, patient examined, no change in status, stable for surgery.  I have reviewed the patient's chart and labs.  Questions were answered to the patient's satisfaction.     Loel Lofty Dillingham

## 2017-11-07 ENCOUNTER — Encounter: Payer: Self-pay | Admitting: Plastic Surgery

## 2017-11-08 LAB — SURGICAL PATHOLOGY

## 2017-11-19 ENCOUNTER — Ambulatory Visit (INDEPENDENT_AMBULATORY_CARE_PROVIDER_SITE_OTHER): Payer: Medicare Other | Admitting: Plastic Surgery

## 2017-11-19 ENCOUNTER — Encounter: Payer: Self-pay | Admitting: Plastic Surgery

## 2017-11-19 VITALS — BP 126/78 | HR 76

## 2017-11-19 DIAGNOSIS — N651 Disproportion of reconstructed breast: Secondary | ICD-10-CM

## 2017-11-19 DIAGNOSIS — E119 Type 2 diabetes mellitus without complications: Secondary | ICD-10-CM

## 2017-11-19 DIAGNOSIS — D0511 Intraductal carcinoma in situ of right breast: Secondary | ICD-10-CM

## 2017-11-19 NOTE — Progress Notes (Signed)
   Subjective:    Patient ID: Natasha Vasquez, female    DOB: 08/23/1940, 77 y.o.   MRN: [id number]  The patient is a 77 year old white female here for follow-up with her husband after breast reconstruction.  She had a left breast reduction mastopexy and exchange of the right expander for an implant.  She had 40 cc of serosanguineous fluid in the right breast that we aspirated.  Nothing looks infected, there was no redness or cellulitis.  Overall she is feeling very good, no pain.  She has mild swelling as expected.    Review of Systems  Constitutional: Negative.   HENT: Negative.   Eyes: Negative.   Respiratory: Negative.   Genitourinary: Negative.   Hematological: Negative.   Psychiatric/Behavioral: Negative.        Objective:   Physical Exam  Constitutional: She appears well-developed and well-nourished.  HENT:  Head: Normocephalic and atraumatic.  Eyes: Pupils are equal, round, and reactive to light.  Pulmonary/Chest: Effort normal.  Neurological: She is alert.  Skin: Skin is warm. No rash noted. No erythema. No pallor.  Psychiatric: She has a normal mood and affect. Her behavior is normal. Judgment and thought content normal.       Assessment & Plan:  Type 2 diabetes mellitus without complication, without long-term current use of insulin (HCC)  Breast asymmetry following reconstructive surgery  Ductal carcinoma in situ (DCIS) of right breast Can transition to a sports bra with mild compression.  Will plan to see her back in 2 weeks.

## 2017-11-21 LAB — HM DIABETES EYE EXAM

## 2017-11-26 ENCOUNTER — Ambulatory Visit: Payer: Medicare Other | Admitting: Plastic Surgery

## 2017-12-03 ENCOUNTER — Ambulatory Visit (INDEPENDENT_AMBULATORY_CARE_PROVIDER_SITE_OTHER): Payer: Medicare Other | Admitting: Plastic Surgery

## 2017-12-03 ENCOUNTER — Encounter: Payer: Self-pay | Admitting: Plastic Surgery

## 2017-12-03 DIAGNOSIS — Z9011 Acquired absence of right breast and nipple: Secondary | ICD-10-CM

## 2017-12-03 DIAGNOSIS — N651 Disproportion of reconstructed breast: Secondary | ICD-10-CM

## 2017-12-03 NOTE — Progress Notes (Signed)
   Subjective:    Patient ID: Natasha Vasquez, female    DOB: 1940/12/18, 77 y.o.   MRN: [id number]  The patient is here for a follow-up on her left breast mastopexy reduction for symmetry.  She is doing well overall.  She is ready to increase her activity.  There is no sign of infection, seroma, hematoma and the bruising is markedly improved.  There is a little bit of fullness on the medial aspect of the right breast which is improving.  Nipple areola it has good color and capillary refill.   Review of Systems  Constitutional: Negative.   HENT: Negative.   Eyes: Negative.   Respiratory: Negative.   Gastrointestinal: Negative.   Endocrine: Negative.   Genitourinary: Negative.   Musculoskeletal: Negative.   Skin: Negative.  Negative for color change.  Neurological: Negative.   Psychiatric/Behavioral: Negative.       Objective:   Physical Exam  Constitutional: She appears well-developed and well-nourished.  HENT:  Head: Normocephalic and atraumatic.  Eyes: Pupils are equal, round, and reactive to light. EOM are normal.  Pulmonary/Chest: Effort normal.  Neurological: She is alert.  Psychiatric: She has a normal mood and affect. Her behavior is normal.        Assessment & Plan:  Breast asymmetry following reconstructive surgery  Acquired absence of right breast Recommend massage to the medial right breast.  Is interested in nipple areole reconstruction in the next several months.

## 2017-12-23 ENCOUNTER — Telehealth: Payer: Self-pay

## 2017-12-23 ENCOUNTER — Ambulatory Visit: Payer: Medicare Other | Admitting: Family Medicine

## 2017-12-23 ENCOUNTER — Ambulatory Visit
Admission: RE | Admit: 2017-12-23 | Discharge: 2017-12-23 | Disposition: A | Discharge status: Home / Self Care | Payer: Medicare Other | Attending: Family Medicine | Admitting: Family Medicine

## 2017-12-23 ENCOUNTER — Ambulatory Visit
Admission: RE | Admit: 2017-12-23 | Discharge: 2017-12-23 | Disposition: A | Discharge status: Home / Self Care | Payer: Medicare Other | Source: Ambulatory Visit | Attending: Family Medicine | Admitting: Family Medicine

## 2017-12-23 ENCOUNTER — Encounter: Payer: Self-pay | Admitting: Family Medicine

## 2017-12-23 VITALS — BP 119/75 | HR 85 | Temp 98.5°F | Wt 176.2 lb

## 2017-12-23 DIAGNOSIS — R05 Cough: Secondary | ICD-10-CM | POA: Diagnosis not present

## 2017-12-23 DIAGNOSIS — R0989 Other specified symptoms and signs involving the circulatory and respiratory systems: Secondary | ICD-10-CM | POA: Insufficient documentation

## 2017-12-23 DIAGNOSIS — R0602 Shortness of breath: Secondary | ICD-10-CM

## 2017-12-23 DIAGNOSIS — R059 Cough, unspecified: Secondary | ICD-10-CM

## 2017-12-23 MED ORDER — DOXYCYCLINE HYCLATE 100 MG PO TABS: 100.0000 mg | ORAL_TABLET | Freq: Two times a day (BID) | ORAL | 0 refills | Status: AC

## 2017-12-23 NOTE — Telephone Encounter (Signed)
LMTCB

## 2017-12-23 NOTE — Telephone Encounter (Signed)
-----   Message from Virginia Crews, MD sent at 12/23/2017  4:55 PM EST ----- Appears to be atelectasis, not pneumonia in Left lung base.  This is airspace that is not being used.  Sometimes this can be related to lung scarring from radiation.  There is a nodule noted that they cannot tell if its in the lung on vertebrae.  When feeling better, we should get a CT of the chest to evaluate further.  This is not urgent.  Treatment as discussed.  Let us know if not doing better

## 2017-12-23 NOTE — Progress Notes (Signed)
Patient: Natasha Vasquez Female    DOB: January 11, 1941   77 y.o.   MRN: [id number] Visit Date: 12/23/2017  Today's Provider: Lavon Paganini, MD   Chief Complaint  Patient presents with  . Cough   Subjective:    Cough  This is a new problem. Episode onset: 6 days ago. The problem has been gradually worsening. The problem occurs every few minutes. The cough is productive of sputum. Associated symptoms include chills, ear pain, a fever, headaches, nasal congestion, rhinorrhea, a sore throat, shortness of breath, sweats and wheezing. Pertinent negatives include no hemoptysis or myalgias. Associated symptoms comments: Congestion, fever. Treatments tried: Robitussin DM and Tylenol. The treatment provided no relief.   Fever is subjective.  She has not checked it. Having night sweats.     Allergies  Allergen Reactions  . Codeine Other (See Comments)    Chest pain  . Lorazepam Other (See Comments)    "Feels loopy"  . Oxycodone Other (See Comments)    MENTAL CHANGES  . Paregoric     Chest pain---tolerated MORPHINE  . Penicillins Hives    Has patient had a PCN reaction causing immediate rash, facial/tongue/throat swelling, SOB or lightheadedness with hypotension: Yes Has patient had a PCN reaction causing severe rash involving mucus membranes or skin necrosis: No Has patient had a PCN reaction that required hospitalization: NO Has patient had a PCN reaction occurring within the last 10 years: No If all of the above answers are "NO", then may proceed with Cephalosporin use.  Can take Augmentin  . Aleve [Naproxen Sodium] Hives, Rash and Other (See Comments)    headaches     Current Outpatient Medications:  .  ARIPiprazole (ABILIFY) 2 MG tablet, TAKE 1 TABLET (2 MG TOTAL) BY MOUTH EVERY EVENING., Disp: 90 tablet, Rfl: 2 .  aspirin 81 MG tablet, Take 81 mg by mouth daily., Disp: , Rfl:  .  atorvastatin (LIPITOR) 20 MG tablet, Take 1 tablet (20 mg total) by mouth daily., Disp: 90  tablet, Rfl: 1 .  lisinopril (PRINIVIL,ZESTRIL) 5 MG tablet, TAKE 1 TABLET BY MOUTH EVERY DAY, Disp: 90 tablet, Rfl: 1 .  omeprazole (PRILOSEC) 40 MG capsule, Take 40 mg by mouth daily before breakfast., Disp: , Rfl:  .  oxybutynin (DITROPAN-XL) 10 MG 24 hr tablet, TAKE 1 TABLET BY MOUTH EVERYDAY AT BEDTIME (Patient taking differently: Take 10 mg by mouth at bedtime. ), Disp: 90 tablet, Rfl: 1 .  Venlafaxine HCl 225 MG TB24, Take 1 tablet (225 mg total) by mouth daily., Disp: 90 each, Rfl: 2  Review of Systems  Constitutional: Positive for chills and fever.  HENT: Positive for congestion, ear pain, rhinorrhea and sore throat.   Respiratory: Positive for cough, shortness of breath and wheezing. Negative for hemoptysis.   Cardiovascular: Negative.   Musculoskeletal: Negative.  Negative for myalgias.  Neurological: Positive for headaches.    Social History   Tobacco Use  . Smoking status: Former Smoker    Types: Cigarettes    Last attempt to quit: 1965    Years since quitting: 54.9  . Smokeless tobacco: Never Used  . Tobacco comment: smoked in college  Substance Use Topics  . Alcohol use: Yes    Alcohol/week: 7.0 - 14.0 standard drinks    Types: 7 - 14 Glasses of wine per week    Comment: 1-2 glasses of wine per night   Objective:   BP 119/75 (BP Location: Right Arm, Patient Position: Sitting, Cuff  Size: Normal)   Pulse 85   Temp 98.5 F (36.9 C) (Oral)   Wt 176 lb 3.2 oz (79.9 kg)   SpO2 96%   BMI 29.32 kg/m  Vitals:   12/23/17 1522  BP: 119/75  Pulse: 85  Temp: 98.5 F (36.9 C)  TempSrc: Oral  SpO2: 96%  Weight: 176 lb 3.2 oz (79.9 kg)     Physical Exam  Constitutional: She is oriented to person, place, and time. She appears well-developed and well-nourished. No distress.  HENT:  Head: Normocephalic and atraumatic.  Right Ear: Tympanic membrane, external ear and ear canal normal.  Left Ear: Tympanic membrane, external ear and ear canal normal.  Nose: Mucosal  edema present. Right sinus exhibits no maxillary sinus tenderness and no frontal sinus tenderness. Left sinus exhibits no maxillary sinus tenderness and no frontal sinus tenderness.  Mouth/Throat: Uvula is midline and mucous membranes are normal. Posterior oropharyngeal erythema present. No oropharyngeal exudate or posterior oropharyngeal edema.  Eyes: Pupils are equal, round, and reactive to light. Conjunctivae and EOM are normal. Right eye exhibits no discharge. Left eye exhibits no discharge. No scleral icterus.  Neck: Neck supple. No thyromegaly present.  Cardiovascular: Normal rate, regular rhythm, normal heart sounds and intact distal pulses.  No murmur heard. Pulmonary/Chest: Effort normal. No respiratory distress.  Deep hacking cough intermittently Crackles in left base with diminished breath sounds Breath sounds normal in other lung fields  Musculoskeletal: She exhibits no edema.  Lymphadenopathy:    She has no cervical adenopathy.  Neurological: She is alert and oriented to person, place, and time.  Skin: Skin is warm and dry. Capillary refill takes less than 2 seconds. No rash noted.  Psychiatric: She has a normal mood and affect. Her behavior is normal.  Vitals reviewed.       Assessment & Plan:   1. Cough 2. Shortness of breath 3. Abnormal lung sounds -New problem - Likely started as URI, but now with worsening cough, shortness of breath, and abnormal lung exam -Concern for possible immunity acquired pneumonia -Start empiric treatment with doxycycline -Get chest x-ray to evaluate further -Discussed symptomatic management as well -Discussed return precautions - DG Chest 2 View; Future    Meds ordered this encounter  Medications  . doxycycline (VIBRA-TABS) 100 MG tablet    Sig: Take 1 tablet (100 mg total) by mouth 2 (two) times daily for 7 days.    Dispense:  14 tablet    Refill:  0     Return if symptoms worsen or fail to improve.   The entirety of the  information documented in the History of Present Illness, Review of Systems and Physical Exam were personally obtained by me. Portions of this information were initially documented by Tiburcio Pea, CMA and reviewed by me for thoroughness and accuracy.    Virginia Crews, MD, MPH [facility] 12/23/2017 3:44 PM

## 2017-12-23 NOTE — Patient Instructions (Signed)

## 2017-12-24 NOTE — Telephone Encounter (Signed)
Pt returned missed call.  Please call pt back. ° °Thanks, °TGH °

## 2017-12-24 NOTE — Telephone Encounter (Signed)
Patient advised. She verbalized understanding. She will call back when feeling better for a chest CT order.

## 2017-12-25 ENCOUNTER — Telehealth: Payer: Self-pay

## 2017-12-25 MED ORDER — BENZONATATE 100 MG PO CAPS: 100.0000 mg | ORAL_CAPSULE | Freq: Two times a day (BID) | ORAL | 0 refills | Status: DC | PRN

## 2017-12-25 NOTE — Telephone Encounter (Signed)
Patient was seen on 12/23/2017. She states her symptoms are unchanged. She C/O cough, congestion, headache, and sore throat. She states she was told to call back if symptoms were not improving. She is also requesting a cough medication. [facility] CB# [phone].

## 2017-12-25 NOTE — Telephone Encounter (Signed)
Tessalon sent to pharmacy for cough. Will need to continue Doxycycline.  Expect this to take at least a week or so to feel better

## 2017-12-25 NOTE — Telephone Encounter (Signed)
Patient was advised.  

## 2017-12-31 ENCOUNTER — Telehealth: Payer: Self-pay | Admitting: Family Medicine

## 2017-12-31 ENCOUNTER — Other Ambulatory Visit: Payer: Self-pay | Admitting: Family Medicine

## 2017-12-31 NOTE — Telephone Encounter (Signed)
Patient advised.

## 2017-12-31 NOTE — Telephone Encounter (Signed)
Patient states that she is not much better from when she saw you on 12/23/2017.  She finished the antibiotics yesterday morning.  She is wanting to know if you would like to give her something else so she doesn't get worse.  She is still having a sore throat, ear pain, cough, and a lot of green mucus.  She states that she is also weak and worn out.  She states that she has not been out of her house except to go to the doctor since she got sick.  [facility] [address].

## 2017-12-31 NOTE — Telephone Encounter (Signed)
After receiving a course of antibiotics, would not recommend another antibiotic Would wait out the symptoms.  If worsening, can come in for follow-up appointment to be reevaluated

## 2018-01-17 ENCOUNTER — Telehealth: Payer: Self-pay

## 2018-01-17 NOTE — Telephone Encounter (Signed)
Patient called to schedule her CT scan of chest. Please advise. sd

## 2018-01-20 ENCOUNTER — Other Ambulatory Visit: Payer: Self-pay | Admitting: Family Medicine

## 2018-01-20 DIAGNOSIS — R918 Other nonspecific abnormal finding of lung field: Secondary | ICD-10-CM

## 2018-01-20 NOTE — Telephone Encounter (Signed)
CT ordered per CXR recommendations on 12/23/17.  This will need to be scheduled by referral team and then she will be called

## 2018-01-23 ENCOUNTER — Ambulatory Visit
Admission: RE | Admit: 2018-01-23 | Discharge: 2018-01-23 | Disposition: A | Discharge status: Home / Self Care | Payer: Medicare Other | Source: Ambulatory Visit | Attending: Family Medicine | Admitting: Family Medicine

## 2018-01-23 ENCOUNTER — Telehealth: Payer: Self-pay

## 2018-01-23 DIAGNOSIS — R918 Other nonspecific abnormal finding of lung field: Secondary | ICD-10-CM | POA: Diagnosis not present

## 2018-01-23 NOTE — Telephone Encounter (Signed)
Pt advised of CT Scan results.  States she still has a cough and some shortness of breath.  She would like to try an antibiotic and prednisone.  Please send to [place].     Thanks,   -Mickel Baas

## 2018-01-24 ENCOUNTER — Telehealth: Payer: Self-pay

## 2018-01-24 MED ORDER — AZITHROMYCIN 250 MG PO TABS: ORAL_TABLET | ORAL | 0 refills | Status: DC

## 2018-01-24 MED ORDER — PREDNISONE 20 MG PO TABS: 40.0000 mg | ORAL_TABLET | Freq: Every day | ORAL | 0 refills | Status: AC

## 2018-01-24 NOTE — Telephone Encounter (Signed)
Patient advised.

## 2018-01-24 NOTE — Telephone Encounter (Signed)
Rx sent 

## 2018-01-24 NOTE — Telephone Encounter (Signed)
-----   Message from Virginia Crews, MD sent at 01/23/2018  3:34 PM EST ----- CT scan shows an area in the left lung that appears to have some trapped mucus.  This is likely related to a resolving pneumonia.  If she still having any symptoms such as cough or shortness of breath?  If so, we could consider repeat antibiotic course with possible prednisone as well.  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  She will need a repeat CT scan in 1 year to follow-up on this

## 2018-01-24 NOTE — Telephone Encounter (Signed)
Patient was advised she still complains of cough, shortness of breath and hoarseness, she request that we send prescription [facility]. KW

## 2018-01-24 NOTE — Telephone Encounter (Signed)
Responded to Laura's other phone note.  Rxs sent to pharmacy this morning

## 2018-02-06 ENCOUNTER — Other Ambulatory Visit: Payer: Self-pay | Admitting: Family Medicine

## 2018-02-06 DIAGNOSIS — N3281 Overactive bladder: Secondary | ICD-10-CM

## 2018-03-12 ENCOUNTER — Ambulatory Visit (INDEPENDENT_AMBULATORY_CARE_PROVIDER_SITE_OTHER): Payer: Medicare Other

## 2018-03-12 VITALS — BP 104/68 | HR 86 | Temp 97.9°F | Ht 65.0 in | Wt 180.4 lb

## 2018-03-12 DIAGNOSIS — Z0000 Encounter for general adult medical examination without abnormal findings: Secondary | ICD-10-CM | POA: Diagnosis not present

## 2018-03-12 NOTE — Patient Instructions (Addendum)
Natasha Vasquez , Thank you for taking time to come for your Medicare Wellness Visit. I appreciate your ongoing commitment to your health goals. Please review the following plan we discussed and let me know if I can assist you in the future.   Screening recommendations/referrals: Colonoscopy: No longer required.  Mammogram: No longer required.  Bone Density: Up to date, due 02/2021 Recommended yearly ophthalmology/optometry visit for glaucoma screening and checkup Recommended yearly dental visit for hygiene and checkup  Vaccinations: Influenza vaccine: Up to date Pneumococcal vaccine: Completed series Tdap vaccine: Up to date, due 10/2021 Shingles vaccine: Completed series    Advanced directives: On file.   Conditions/risks identified: Obesity- Recommend to exercise for 3 days a week for at least 30 minutes at a time. Continue increasing water intake to 6-8 8oz glasses a day.   Next appointment: 03/14/18 @ 10 AM with Dr Brita Romp.    Preventive Care 78 Years and Older, Female Preventive care refers to lifestyle choices and visits with your health care provider that can promote health and wellness. What does preventive care include?  A yearly physical exam. This is also called an annual well check.  Dental exams once or twice a year.  Routine eye exams. Ask your health care provider how often you should have your eyes checked.  Personal lifestyle choices, including:  Daily care of your teeth and gums.  Regular physical activity.  Eating a healthy diet.  Avoiding tobacco and drug use.  Limiting alcohol use.  Practicing safe sex.  Taking low-dose aspirin every day.  Taking vitamin and mineral supplements as recommended by your health care provider. What happens during an annual well check? The services and screenings done by your health care provider during your annual well check will depend on your age, overall health, lifestyle risk factors, and family history of  disease. Counseling  Your health care provider may ask you questions about your:  Alcohol use.  Tobacco use.  Drug use.  Emotional well-being.  Home and relationship well-being.  Sexual activity.  Eating habits.  History of falls.  Memory and ability to understand (cognition).  Work and work Statistician.  Reproductive health. Screening  You may have the following tests or measurements:  Height, weight, and BMI.  Blood pressure.  Lipid and cholesterol levels. These may be checked every 5 years, or more frequently if you are over 55 years old.  Skin check.  Lung cancer screening. You may have this screening every year starting at age 63 if you have a 30-pack-year history of smoking and currently smoke or have quit within the past 15 years.  Fecal occult blood test (FOBT) of the stool. You may have this test every year starting at age 40.  Flexible sigmoidoscopy or colonoscopy. You may have a sigmoidoscopy every 5 years or a colonoscopy every 10 years starting at age 13.  Hepatitis C blood test.  Hepatitis B blood test.  Sexually transmitted disease (STD) testing.  Diabetes screening. This is done by checking your blood sugar (glucose) after you have not eaten for a while (fasting). You may have this done every 1-3 years.  Bone density scan. This is done to screen for osteoporosis. You may have this done starting at age 52.  Mammogram. This may be done every 1-2 years. Talk to your health care provider about how often you should have regular mammograms. Talk with your health care provider about your test results, treatment options, and if necessary, the need for more tests. Vaccines  Your health care provider may recommend certain vaccines, such as:  Influenza vaccine. This is recommended every year.  Tetanus, diphtheria, and acellular pertussis (Tdap, Td) vaccine. You may need a Td booster every 10 years.  Zoster vaccine. You may need this after age  78.  Pneumococcal 13-valent conjugate (PCV13) vaccine. One dose is recommended after age 19.  Pneumococcal polysaccharide (PPSV23) vaccine. One dose is recommended after age 2. Talk to your health care provider about which screenings and vaccines you need and how often you need them. This information is not intended to replace advice given to you by your health care provider. Make sure you discuss any questions you have with your health care provider. Document Released: 01/28/2015 Document Revised: 09/21/2015 Document Reviewed: 11/02/2014 Elsevier Interactive Patient Education  2017 [place] Prevention in the Home Falls can cause injuries. They can happen to people of all ages. There are many things you can do to make your home safe and to help prevent falls. What can I do on the outside of my home?  Regularly fix the edges of walkways and driveways and fix any cracks.  Remove anything that might make you trip as you walk through a door, such as a raised step or threshold.  Trim any bushes or trees on the path to your home.  Use bright outdoor lighting.  Clear any walking paths of anything that might make someone trip, such as rocks or tools.  Regularly check to see if handrails are loose or broken. Make sure that both sides of any steps have handrails.  Any raised decks and porches should have guardrails on the edges.  Have any leaves, snow, or ice cleared regularly.  Use sand or salt on walking paths during winter.  Clean up any spills in your garage right away. This includes oil or grease spills. What can I do in the bathroom?  Use night lights.  Install grab bars by the toilet and in the tub and shower. Do not use towel bars as grab bars.  Use non-skid mats or decals in the tub or shower.  If you need to sit down in the shower, use a plastic, non-slip stool.  Keep the floor dry. Clean up any water that spills on the floor as soon as it happens.  Remove  soap buildup in the tub or shower regularly.  Attach bath mats securely with double-sided non-slip rug tape.  Do not have throw rugs and other things on the floor that can make you trip. What can I do in the bedroom?  Use night lights.  Make sure that you have a light by your bed that is easy to reach.  Do not use any sheets or blankets that are too big for your bed. They should not hang down onto the floor.  Have a firm chair that has side arms. You can use this for support while you get dressed.  Do not have throw rugs and other things on the floor that can make you trip. What can I do in the kitchen?  Clean up any spills right away.  Avoid walking on wet floors.  Keep items that you use a lot in easy-to-reach places.  If you need to reach something above you, use a strong step stool that has a grab bar.  Keep electrical cords out of the way.  Do not use floor polish or wax that makes floors slippery. If you must use wax, use non-skid floor wax.  Do  not have throw rugs and other things on the floor that can make you trip. What can I do with my stairs?  Do not leave any items on the stairs.  Make sure that there are handrails on both sides of the stairs and use them. Fix handrails that are broken or loose. Make sure that handrails are as long as the stairways.  Check any carpeting to make sure that it is firmly attached to the stairs. Fix any carpet that is loose or worn.  Avoid having throw rugs at the top or bottom of the stairs. If you do have throw rugs, attach them to the floor with carpet tape.  Make sure that you have a light switch at the top of the stairs and the bottom of the stairs. If you do not have them, ask someone to add them for you. What else can I do to help prevent falls?  Wear shoes that:  Do not have high heels.  Have rubber bottoms.  Are comfortable and fit you well.  Are closed at the toe. Do not wear sandals.  If you use a  stepladder:  Make sure that it is fully opened. Do not climb a closed stepladder.  Make sure that both sides of the stepladder are locked into place.  Ask someone to hold it for you, if possible.  Clearly mark and make sure that you can see:  Any grab bars or handrails.  First and last steps.  Where the edge of each step is.  Use tools that help you move around (mobility aids) if they are needed. These include:  Canes.  Walkers.  Scooters.  Crutches.  Turn on the lights when you go into a dark area. Replace any light bulbs as soon as they burn out.  Set up your furniture so you have a clear path. Avoid moving your furniture around.  If any of your floors are uneven, fix them.  If there are any pets around you, be aware of where they are.  Review your medicines with your doctor. Some medicines can make you feel dizzy. This can increase your chance of falling. Ask your doctor what other things that you can do to help prevent falls. This information is not intended to replace advice given to you by your health care provider. Make sure you discuss any questions you have with your health care provider. Document Released: 10/28/2008 Document Revised: 06/09/2015 Document Reviewed: 02/05/2014 Elsevier Interactive Patient Education  2017 [employer].

## 2018-03-12 NOTE — Progress Notes (Signed)
Subjective:   Natasha Vasquez is a 78 y.o. female who presents for Medicare Annual (Subsequent) preventive examination.  Review of Systems:  N/A  Cardiac Risk Factors include: advanced age (>30men, >7 women);diabetes mellitus;dyslipidemia;hypertension;obesity (BMI >30kg/m2)     Objective:     Vitals: BP 104/68 (BP Location: Right Arm)   Pulse 86   Temp 97.9 F (36.6 C) (Oral)   Ht 5\' 5"  (1.651 m)   Wt 180 lb 6.4 oz (81.8 kg)   BMI 30.02 kg/m   Body mass index is 30.02 kg/m.  Advanced Directives 03/12/2018 11/04/2017 06/03/2017 05/24/2017 03/11/2017 03/14/2016 03/01/2016  Does Patient Have a Medical Advance Directive? Yes Yes Yes Yes Yes Yes Yes  Type of Paramedic of [place];Living will [place];Living will [place];Living will [place];Living will [place];Living will [place];Living will [place];Living will  Does patient want to make changes to medical advance directive? - No - Patient declined No - Patient declined - - No - Patient declined -  Copy of [place] in Chart? Yes - validated most recent copy scanned in chart (See row information) Yes No - copy requested No - copy requested No - copy requested No - copy requested No - copy requested    Tobacco Social History   Tobacco Use  Smoking Status Former Smoker  . Types: Cigarettes  . Last attempt to quit: 1965  . Years since quitting: 55.1  Smokeless Tobacco Never Used  Tobacco Comment   smoked in college     Counseling given: Not Answered Comment: smoked in college   Clinical Intake:  Pre-visit preparation completed: Yes  Pain : No/denies pain Pain Score: 0-No pain    Diabetes:  Is the patient diabetic?  Yes type 2 If diabetic, was a CBG obtained today?  No  Did the patient bring in their glucometer from home?  No  How often do you monitor  your CBG's? Does not.   Financial Strains and Diabetes Management:  Are you having any financial strains with the device, your supplies or your medication? No .  Does the patient want to be seen by Chronic Care Management for management of their diabetes?  No  Would the patient like to be referred to a Nutritionist or for Diabetic Management?  No   Diabetic Exams:  Diabetic Eye Exam: Completed 11/21/17.    Diabetic Foot Exam: Completed 03/11/17. Pt has been advised about the importance in completing this exam. Note made to f/u on this at CPE.   Nutritional Status: BMI > 30  Obese Nutritional Risks: Nausea/ vomitting/ diarrhea(Diarrhea occasionally due to food an stress. )   How often do you need to have someone help you when you read instructions, pamphlets, or other written materials from your doctor or pharmacy?: 1 - Never  Interpreter Needed?: No  Information entered by :: [facility], LPN  Past Medical History:  Diagnosis Date  . Anemia in pregnancy   . Arthritis    hands  . Barrett esophagus   . 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  ER/PR NEGATIVE  . Cancer ([place])    melanoma  . Complication of anesthesia    20 years ago pts b/p dropped really low  . Depression   . Depression   . Diabetes mellitus without complication (HCC)    diet controlled  . Family history of adverse  reaction to anesthesia    son PONV  . GERD (gastroesophageal reflux disease)   . Hypercholesteremia   . Hyperlipidemia   . Hypertension   . MVP (mitral valve prolapse)    followed by PCP  . Nephritis   . Nephritis   . S/P appendectomy   . Torn meniscus    left   Past Surgical History:  Procedure Laterality Date  . ABDOMINAL HYSTERECTOMY    . APPENDECTOMY    . BREAST BIOPSY Right 04/25/2017   retroareolar 10:00   wing clip   path pending  . BREAST BIOPSY Right 04/25/2017   10:00  5CMFN  venus clip    path pending  . BREAST BIOPSY Right 05/03/2017   Affirm  Bx- path pending  . BREAST CYST ASPIRATION Bilateral    NEG  . BREAST EXCISIONAL BIOPSY Left 20+ yrs ago   NEG  . BREAST RECONSTRUCTION WITH PLACEMENT OF TISSUE EXPANDER AND FLEX HD (ACELLULAR HYDRATED DERMIS) Right 06/03/2017   Procedure: BREAST RECONSTRUCTION WITH PLACEMENT OF TISSUE EXPANDER AND FLEX HD (ACELLULAR HYDRATED DERMIS);  Surgeon: Wallace Going, DO;  Location: [facility];  Service: Plastics;  Laterality: Right;  . BREAST REDUCTION SURGERY Left 11/06/2017   Procedure: BREAST REDUCTION;  Surgeon: Wallace Going, DO;  Location: [facility];  Service: Plastics;  Laterality: Left;  . BROW LIFT Bilateral 02/01/2015   Procedure: BLEPHAROPLASTY bilateral upper eyelids.;  Surgeon: Karle Starch, MD;  Location: [place];  Service: Ophthalmology;  Laterality: Bilateral;  DIABETIC - diet controlled  . CHOLECYSTECTOMY    . COLONOSCOPY    . COLONOSCOPY WITH PROPOFOL N/A 05/11/2015   Procedure: COLONOSCOPY WITH PROPOFOL;  Surgeon: Manya Silvas, MD;  Location: [facility];  Service: Endoscopy;  Laterality: N/A;  . ESOPHAGOGASTRODUODENOSCOPY    . ESOPHAGOGASTRODUODENOSCOPY (EGD) WITH PROPOFOL  05/11/2015   Procedure: ESOPHAGOGASTRODUODENOSCOPY (EGD) WITH PROPOFOL;  Surgeon: Manya Silvas, MD;  Location: [facility] END[facility]COPY;  Service: Endoscopy;;  . FRACTURE SURGERY Right    arm and shoulder  . JOINT REPLACEMENT    . KNEE ARTHROSCOPY Left 03/14/2016   Procedure: ARTHROSCOPY KNEE, PARTIAL MEDIAL MENISECTOMY, CHONDROPLASTY;  Surgeon: Dereck Leep, MD;  Location: [facility];  Service: Orthopedics;  Laterality: Left;  Marland Kitchen MASTECTOMY W/ SENTINEL NODE BIOPSY Right 06/03/2017   Procedure: MASTECTOMY WITH SENTINEL LYMPH NODE BIOPSY;  Surgeon: Robert Bellow, MD;  Location: [facility];  Service: General;  Laterality: Right;  . PARTIAL HIP ARTHROPLASTY Right   . REMOVAL OF BILATERAL TISSUE EXPANDERS WITH PLACEMENT OF BILATERAL BREAST IMPLANTS Right 11/06/2017   Procedure: REMOVAL OF  TISSUE EXPANDER WITH PLACEMENT OF BREAST IMPLANT;  Surgeon: Wallace Going, DO;  Location: [facility];  Service: Plastics;  Laterality: Right;  . TONSILLECTOMY     Family History  Problem Relation Age of Onset  . Cancer Mother        colon/rectal  . Colon cancer Mother   . Heart disease Father   . Hypertension Father   . Atrial fibrillation Father   . Diabetes Father   . Cancer Brother        lung  . Healthy Son   . Healthy Son   . Breast cancer Neg Hx    Social History   Socioeconomic History  . Marital status: Married    Spouse name: Sonia Side  . Number of children: 2  . Years of education: Not on file  . Highest education level: Master's degree (e.g., MA, MS, MEng, MEd, MSW, MBA)  Occupational History  . Occupation: retired  Scientific laboratory technician  . Financial resource strain: Not hard at all  . Food insecurity:    Worry: Never true    Inability: Never true  . Transportation needs:    Medical: No    Non-medical: No  Tobacco Use  . Smoking status: Former Smoker    Types: Cigarettes    Last attempt to quit: 1965    Years since quitting: 55.1  . Smokeless tobacco: Never Used  . Tobacco comment: smoked in college  Substance and Sexual Activity  . Alcohol use: Yes    Alcohol/week: 7.0 - 14.0 standard drinks    Types: 7 - 14 Glasses of wine per week    Comment: 1-2 glasses of wine per night  . Drug use: No  . Sexual activity: Never  Lifestyle  . Physical activity:    Days per week: 0 days    Minutes per session: 0 min  . Stress: Not at all  Relationships  . Social connections:    Talks on phone: Patient refused    Gets together: Patient refused    Attends religious service: Patient refused    Active member of club or organization: Patient refused    Attends meetings of clubs or organizations: Patient refused    Relationship status: Patient refused  Other Topics Concern  . Not on file  Social History Narrative  . Not on file    Outpatient Encounter Medications  as of 03/12/2018  Medication Sig  . acetaminophen (TYLENOL) 500 MG tablet Take 500 mg by mouth every 6 (six) hours as needed.  . ARIPiprazole (ABILIFY) 2 MG tablet TAKE 1 TABLET (2 MG TOTAL) BY MOUTH EVERY EVENING.  Marland Kitchen aspirin 81 MG tablet Take 81 mg by mouth daily.  Marland Kitchen atorvastatin (LIPITOR) 20 MG tablet Take 1 tablet (20 mg total) by mouth daily.  Marland Kitchen bismuth subsalicylate (PEPTO BISMOL) 262 MG chewable tablet Chew 262 mg by mouth as needed. **capsule**  . calcium carbonate (TUMS - DOSED IN MG ELEMENTAL CALCIUM) 500 MG chewable tablet Chew 1 tablet by mouth as needed for indigestion or heartburn.  . clindamycin (CLEOCIN) 150 MG capsule Take 6,000 mg by mouth. One hour prior to dental procedure  . lisinopril (PRINIVIL,ZESTRIL) 5 MG tablet TAKE 1 TABLET BY MOUTH EVERY DAY  . Multiple Vitamins-Minerals (CENTRUM SILVER PO) Take 1 tablet by mouth daily.  Marland Kitchen omeprazole (PRILOSEC) 40 MG capsule Take 40 mg by mouth daily before breakfast.  . oxybutynin (DITROPAN-XL) 10 MG 24 hr tablet TAKE 1 TABLET BY MOUTH EVERYDAY AT BEDTIME  . Venlafaxine HCl 225 MG TB24 TAKE 1 TABLET (225 MG TOTAL) BY MOUTH DAILY.  Marland Kitchen azithromycin (ZITHROMAX) 250 MG tablet Take 500mg  PO daily x1d and then 250mg  daily x4 days (Patient not taking: Reported on 03/12/2018)  . benzonatate (TESSALON) 100 MG capsule Take 1 capsule (100 mg total) by mouth 2 (two) times daily as needed for cough. (Patient not taking: Reported on 03/12/2018)   No facility-administered encounter medications on file as of 03/12/2018.     Activities of Daily Living In your present state of health, do you have any difficulty performing the following activities: 03/12/2018 11/04/2017  Hearing? N N  Vision? N N  Comment Wears eye glasses daily.  -  Difficulty concentrating or making decisions? N N  Walking or climbing stairs? Y N  Comment Due to left knee pains.  -  Dressing or bathing? N N  Doing errands, shopping? N N  Preparing Food and eating ? N -  Using the  Toilet? N -  In the past six months, have you accidently leaked urine? N -  Do you have problems with loss of bowel control? Y -  Comment Occasionally after bowl movements with pressure.  -  Managing your Medications? N -  Managing your Finances? N -  Housekeeping or managing your Housekeeping? N -  Some recent data might be hidden    Patient Care Team: Bacigalupo, Dionne Bucy, MD as PCP - General (Family Medicine) Watt Climes, PA as Physician Assistant (Physician Assistant) Marry Guan, Laurice Record, MD as Consulting Physician (Orthopedic Surgery) Pa, [place] as Consulting Physician (Optometry) Bary Castilla, Forest Gleason, MD (General Surgery) Dillingham, Loel Lofty, DO as Attending Physician (Plastic Surgery)    Assessment:   This is a routine wellness examination for Shannin.  Exercise Activities and Dietary recommendations Current Exercise Habits: The patient does not participate in regular exercise at present, Exercise limited by: None identified  Goals    . DIET - INCREASE WATER INTAKE     Recommend to increase water intake to 4-6 glasses a day.     . Exercise 3x per week (30 min per time)     Recommend to exercise for 3 days a week for at least 30 minutes at a time.         Fall Risk Fall Risk  03/12/2018 03/11/2017 01/24/2016  Falls in the past year? 0 No Yes  Number falls in past yr: - - 2 or more   FALL RISK PREVENTION PERTAINING TO THE HOME: Any stairs in or around the home? Yes  If so, do they handrails? Yes   Home free of loose throw rugs in walkways, pet beds, electrical cords, etc? Yes  Adequate lighting in your home to reduce risk of falls? Yes   ASSISTIVE DEVICES UTILIZED TO PREVENT FALLS:  Life alert? Yes  Use of a cane, walker or w/c? Yes  Grab bars in the bathroom? Yes  Shower chair or bench in shower? Yes  Elevated toilet seat or a handicapped toilet? Yes    TIMED UP AND GO:  Was the test performed? No .    Depression Screen PHQ 2/9 Scores 03/12/2018  09/12/2017 06/12/2017 03/11/2017  PHQ - 2 Score 2 2 0 3  PHQ- 9 Score 9 7 3 8      Cognitive Function     6CIT Screen 03/12/2018 03/11/2017  What Year? 0 points 0 points  What month? 0 points 0 points  What time? 0 points 0 points  Count back from 20 0 points 0 points  Months in reverse 2 points 0 points  Repeat phrase 0 points 0 points  Total Score 2 0    Immunization History  Administered Date(s) Administered  . Influenza Split 10/24/2011  . Influenza, High Dose Seasonal PF 11/18/2013, 01/24/2016, 10/08/2016, 09/12/2017  . Influenza-Unspecified 10/24/2011, 10/14/2012, 11/18/2013  . Pneumococcal Conjugate-13 08/21/2013  . Pneumococcal Polysaccharide-23 01/24/2016  . Tdap 10/24/2011  . Zoster Recombinat (Shingrix) 04/01/2016, 08/14/2016    Qualifies for Shingles Vaccine? Up to date  Tdap: Up to date  Flu Vaccine: Up to date  Pneumococcal Vaccine: Up to date   Screening Tests Health Maintenance  Topic Date Due  . FOOT EXAM  03/11/2018  . HEMOGLOBIN A1C  03/15/2018  . OPHTHALMOLOGY EXAM  11/22/2018  . DEXA SCAN  03/09/2021  . TETANUS/TDAP  10/23/2021  . INFLUENZA VACCINE  Completed  . PNA vac Low Risk  Adult  Completed    Cancer Screenings:  Colorectal Screening: No longer required.   Mammogram: No longer required.   Bone Density: Completed 03/09/16. Results reflect OSTEOPENIA. Repeat every 5 years.   Lung Cancer Screening: (Low Dose CT Chest recommended if Age 25-80 years, 30 pack-year currently smoking OR have quit w/in 15years.) does not qualify.    Additional Screening:  Vision Screening: Recommended annual ophthalmology exams for early detection of glaucoma and other disorders of the eye.  Dental Screening: Recommended annual dental exams for proper oral hygiene  Community Resource Referral:  CRR required this visit?  No      Plan:  I have personally reviewed and addressed the Medicare Annual Wellness questionnaire and have noted the following in  the patient's chart:  A. Medical and social history B. Use of alcohol, tobacco or illicit drugs  C. Current medications and supplements D. Functional ability and status E.  Nutritional status F.  Physical activity G. Advance directives H. List of other physicians I.  Hospitalizations, surgeries, and ER visits in previous 12 months J.  [place] such as hearing and vision if needed, cognitive and depression L. Referrals and appointments - none  In addition, I have reviewed and discussed with patient certain preventive protocols, quality metrics, and best practice recommendations. A written personalized care plan for preventive services as well as general preventive health recommendations were provided to patient.  See attached scanned questionnaire for additional information.   Signed,  Fabio Neighbors, LPN Nurse Health Advisor   Nurse Recommendations: Pt needs a diabetic foot exam at next OV.

## 2018-03-13 ENCOUNTER — Ambulatory Visit: Payer: Self-pay

## 2018-03-14 ENCOUNTER — Ambulatory Visit (INDEPENDENT_AMBULATORY_CARE_PROVIDER_SITE_OTHER): Payer: Medicare Other | Admitting: Family Medicine

## 2018-03-14 ENCOUNTER — Encounter: Payer: Self-pay | Admitting: Family Medicine

## 2018-03-14 VITALS — BP 136/84 | HR 89 | Temp 98.3°F | Ht 65.0 in | Wt 178.6 lb

## 2018-03-14 DIAGNOSIS — Z0000 Encounter for general adult medical examination without abnormal findings: Secondary | ICD-10-CM

## 2018-03-14 DIAGNOSIS — I10 Essential (primary) hypertension: Secondary | ICD-10-CM

## 2018-03-14 DIAGNOSIS — M25476 Effusion, unspecified foot: Secondary | ICD-10-CM | POA: Insufficient documentation

## 2018-03-14 DIAGNOSIS — M7742 Metatarsalgia, left foot: Secondary | ICD-10-CM

## 2018-03-14 DIAGNOSIS — D0511 Intraductal carcinoma in situ of right breast: Secondary | ICD-10-CM

## 2018-03-14 DIAGNOSIS — J984 Other disorders of lung: Secondary | ICD-10-CM

## 2018-03-14 DIAGNOSIS — E038 Other specified hypothyroidism: Secondary | ICD-10-CM

## 2018-03-14 DIAGNOSIS — J439 Emphysema, unspecified: Secondary | ICD-10-CM

## 2018-03-14 DIAGNOSIS — D649 Anemia, unspecified: Secondary | ICD-10-CM | POA: Diagnosis not present

## 2018-03-14 DIAGNOSIS — E7800 Pure hypercholesterolemia, unspecified: Secondary | ICD-10-CM

## 2018-03-14 DIAGNOSIS — E663 Overweight: Secondary | ICD-10-CM

## 2018-03-14 DIAGNOSIS — E039 Hypothyroidism, unspecified: Secondary | ICD-10-CM

## 2018-03-14 DIAGNOSIS — E1142 Type 2 diabetes mellitus with diabetic polyneuropathy: Secondary | ICD-10-CM

## 2018-03-14 DIAGNOSIS — M7741 Metatarsalgia, right foot: Secondary | ICD-10-CM | POA: Insufficient documentation

## 2018-03-14 MED ORDER — TIOTROPIUM BROMIDE MONOHYDRATE 18 MCG IN CAPS: 18.0000 ug | ORAL_CAPSULE | Freq: Every day | RESPIRATORY_TRACT | 12 refills | Status: DC

## 2018-03-14 MED ORDER — ALBUTEROL SULFATE HFA 108 (90 BASE) MCG/ACT IN AERS: 2.0000 | INHALATION_SPRAY | Freq: Four times a day (QID) | RESPIRATORY_TRACT | 2 refills | Status: DC | PRN

## 2018-03-14 NOTE — Assessment & Plan Note (Signed)
Status post mastectomy with reconstruction Followed by general surgery Next visit and mammogram in 04/2018

## 2018-03-14 NOTE — Assessment & Plan Note (Signed)
None present currently, but history of intermittent swelling at the MTP with redness, warmth, and exquisite tenderness is concerning for possible gout Patient does not have any renal disease and needs a low purine diet Check uric acid level Consider colchicine as needed versus prophylactic medication if she is having a lot of flares She will try to come in to be evaluated if this flares again in the future

## 2018-03-14 NOTE — Assessment & Plan Note (Signed)
Discussed healthy weight management, diet, exercise 

## 2018-03-14 NOTE — Assessment & Plan Note (Signed)
Noted to have some nodularity in left lung base thought to be related to recent infection on CT chest in 01/2018 Needs repeat chest CT in 07/2018 given history of primary malignancies

## 2018-03-14 NOTE — Assessment & Plan Note (Signed)
  Medications Asymptomatic Recheck TSH and free T4

## 2018-03-14 NOTE — Assessment & Plan Note (Signed)
New problem Patient has had some symptoms for a while, but has not discussed it with [place] Discussed importance of checking her feet every night before she goes to bed Discussed possibility of using gabapentin, but she does not feel like this is bad enough to take a medication for it at this time We will continue to monitor

## 2018-03-14 NOTE — Assessment & Plan Note (Signed)
Well-controlled Continue lisinopril Recheck metabolic panel Follow-up in 6 months

## 2018-03-14 NOTE — Assessment & Plan Note (Signed)
Patient with loss of transverse arch and metatarsal fat pad now with chronic metatarsalgia bilateral feet Discussed importance of metatarsal pads and arch support Discussed the importance of supportive shoes Could consider podiatry referral in the future if needed

## 2018-03-14 NOTE — Assessment & Plan Note (Signed)
Patient with very small smoking history but a lot of lifetime smoking exposure Emphysema noted on lung CT Now having shortness of breath and chronic cough with wheezing We will start Spiriva and albuterol as needed and see if this helps with her symptoms Follow-up in 3 months

## 2018-03-14 NOTE — Progress Notes (Signed)
Patient: Natasha Vasquez, Female    DOB: [birth year], 78 y.o.   MRN: [id number] Visit Date: 03/14/2018  Today's Provider: Lavon Paganini, MD   Chief Complaint  Patient presents with  . Annual Exam   Subjective:    I, Natasha Vasquez, CMA, am acting as a scribe for Natasha Paganini, MD.   Patient had a AWE on 03/12/2018   Complete Physical Natasha Vasquez is a 78 y.o. female. She feels fairly well. She reports experiencing some upper respiratory symptoms including cough since December. She reports exercising none. She reports she is sleeping fairly well.  Patient was diagnosed with right breast cancer in 04/2017.  She underwent right mastectomy and reconstruction.  She is followed by plastic surgery and general surgery.  Dr. Tollie Vasquez plans to see her again in April and repeat her left mammogram.  Patient developed respiratory symptoms in 12/2017 concerning for possible pneumonia, but chest x-ray did not reveal pneumonia, but did reveal an indeterminate nodular density at the lung base versus a sclerotic lesion in the L1 vertebral body and CT chest was recommended to evaluate further.  CT chest was obtained in 01/2018 which showed areas of mucoid impaction and tree-in-bud nodularity in the left lower lobe with a focal consolidation within the lateral left lower lobe consistent with resolving pneumonia or developing scar.  Also showed no typical findings of metastatic disease.  It was recommended to have a repeat chest CT in 6 months given history of primary malignancies to ensure that the areas of nodularity were truly mucoid impaction and not nodular densities.  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  Also noted to have emphysema and aortic atherosclerosis.  Patient is concerned that since her episode of pneumonia in 12/2017 she continues to have a residual dry cough that she cannot seem to get rid of.  She does  occasionally bring up mucus that is stained with dark dried blood.  She has intermittent shortness of breath that is particularly noticeable when she is talking a lot.  She is also felt lightheaded intermittently but denies any vertigo, syncope, imbalance.  Patient is also concerned about her foot pain.  She was seen in 05/2017 in our office for pain and swelling of R 1st MTP.  She states will occur intermitently in bilateral first MTP joints.  They will become very tender to palpation, red, warm, swollen.  She is not currently taking anything for this.  She also has a feeling of tingling in her toes of both feet.  She has had this for a long time.  She also has pain in the balls of both feet that sometimes radiates into her second and third toes. -----------------------------------------------------------   Review of Systems  Constitutional: Negative.   HENT: Negative.   Eyes: Negative.   Respiratory: Positive for cough, shortness of breath and wheezing. Negative for apnea, choking, chest tightness and stridor.   Cardiovascular: Negative.   Gastrointestinal: Negative.   Endocrine: Negative.   Genitourinary: Negative.   Musculoskeletal: Negative.   Skin: Negative.   Allergic/Immunologic: Negative.   Neurological: Negative.   Hematological: Negative.   Psychiatric/Behavioral: Negative.     Social History   Socioeconomic History  . Marital status: Married    Spouse name: Natasha Vasquez  . Number of children: 2  . Years of education: Not on file  . Highest education level: Master's degree (e.g., MA, MS, MEng, MEd,  MSW, MBA)  Occupational History  . Occupation: retired  Scientific laboratory technician  . Financial resource strain: Not hard at all  . Food insecurity:    Worry: Never true    Inability: Never true  . Transportation needs:    Medical: No    Non-medical: No  Tobacco Use  . Smoking status: Former Smoker    Types: Cigarettes    Last attempt to quit: 1965    Years since quitting: 55.1  .  Smokeless tobacco: Never Used  . Tobacco comment: smoked in college  Substance and Sexual Activity  . Alcohol use: Yes    Alcohol/week: 7.0 - 14.0 standard drinks    Types: 7 - 14 Glasses of wine per week    Comment: 1-2 glasses of wine per night  . Drug use: No  . Sexual activity: Never  Lifestyle  . Physical activity:    Days per week: 0 days    Minutes per session: 0 min  . Stress: Not at all  Relationships  . Social connections:    Talks on phone: Patient refused    Gets together: Patient refused    Attends religious service: Patient refused    Active member of club or organization: Patient refused    Attends meetings of clubs or organizations: Patient refused    Relationship status: Patient refused  . Intimate partner violence:    Fear of current or ex partner: Patient refused    Emotionally abused: Patient refused    Physically abused: Patient refused    Forced sexual activity: Patient refused  Other Topics Concern  . Not on file  Social History Narrative  . Not on file    Past Medical History:  Diagnosis Date  . Anemia in pregnancy   . Arthritis    hands  . Barrett esophagus   . 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  ER/PR NEGATIVE  . Cancer ([place])    melanoma  . Complication of anesthesia    20 years ago pts b/p dropped really low  . Depression   . Depression   . Diabetes mellitus without complication (HCC)    diet controlled  . Family history of adverse reaction to anesthesia    son PONV  . GERD (gastroesophageal reflux disease)   . Hypercholesteremia   . Hyperlipidemia   . Hypertension   . MVP (mitral valve prolapse)    followed by PCP  . Nephritis   . Nephritis   . S/P appendectomy   . Torn meniscus    left     Patient Active Problem List   Diagnosis Date Noted  . Breast asymmetry following reconstructive surgery 10/29/2017  . Acquired absence of right breast 06/11/2017  . DCIS (ductal carcinoma in situ)  06/03/2017  . 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[place]) 05/09/2017  . 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 Dysphagia 03/11/2017  . Overweight 03/11/2017  . Subclinical hypothyroidism 06/08/2015  . Osteopenia 06/08/2015  . Billowing mitral valve 06/08/2015  . Melanoma in situ ([place]) 06/08/2015  . Cramps of lower extremity 06/08/2015  . Arthritis 06/08/2015  . Absolute anemia 06/08/2015  . Type 2 diabetes mellitus ([place]) 05/18/2015  . Colitis, enteritis, and gastroenteritis of presumed infectious origin 05/18/2015  . Closed fracture of neck of right femur ([place]) 03/25/2015  . Clinical depression 01/26/2015  . Hypercholesterolemia 10/28/2014  . Essential hypertension 09/27/2014  . GERD (gastroesophageal reflux disease) 08/06/2014  .  Urinary frequency 07/16/2014  . Bell palsy 04/24/2004    Past Surgical History:  Procedure Laterality Date  . ABDOMINAL HYSTERECTOMY    . APPENDECTOMY    . BREAST BIOPSY Right 04/25/2017   retroareolar 10:00   wing clip   path pending  . BREAST BIOPSY Right 04/25/2017   10:00  5CMFN  venus clip    path pending  . BREAST BIOPSY Right 05/03/2017   Affirm Bx- path pending  . BREAST CYST ASPIRATION Bilateral    NEG  . BREAST EXCISIONAL BIOPSY Left 20+ yrs ago   NEG  . BREAST RECONSTRUCTION WITH PLACEMENT OF TISSUE EXPANDER AND FLEX HD (ACELLULAR HYDRATED DERMIS) Right 06/03/2017   Procedure: BREAST RECONSTRUCTION WITH PLACEMENT OF TISSUE EXPANDER AND FLEX HD (ACELLULAR HYDRATED DERMIS);  Surgeon: Wallace Going, DO;  Location: [facility];  Service: Plastics;  Laterality: Right;  . BREAST REDUCTION SURGERY Left 11/06/2017   Procedure: BREAST REDUCTION;  Surgeon: Wallace Going, DO;  Location: [facility];  Service: Plastics;  Laterality: Left;  . BROW LIFT Bilateral 02/01/2015   Procedure: BLEPHAROPLASTY bilateral upper eyelids.;  Surgeon: Karle Starch, MD;  Location: [facility];  Service:  Ophthalmology;  Laterality: Bilateral;  DIABETIC - diet controlled  . CHOLECYSTECTOMY    . COLONOSCOPY    . COLONOSCOPY WITH PROPOFOL N/A 05/11/2015   Procedure: COLONOSCOPY WITH PROPOFOL;  Surgeon: Manya Silvas, MD;  Location: [facility];  Service: Endoscopy;  Laterality: N/A;  . ESOPHAGOGASTRODUODENOSCOPY    . ESOPHAGOGASTRODUODENOSCOPY (EGD) WITH PROPOFOL  05/11/2015   Procedure: ESOPHAGOGASTRODUODENOSCOPY (EGD) WITH PROPOFOL;  Surgeon: Manya Silvas, MD;  Location: [facility];  Service: Endoscopy;;  . FRACTURE SURGERY Right    arm and shoulder  . JOINT REPLACEMENT    . KNEE ARTHROSCOPY Left 03/14/2016   Procedure: ARTHROSCOPY KNEE, PARTIAL MEDIAL MENISECTOMY, CHONDROPLASTY;  Surgeon: Dereck Leep, MD;  Location: [facility];  Service: Orthopedics;  Laterality: Left;  Marland Kitchen MASTECTOMY W/ SENTINEL NODE BIOPSY Right 06/03/2017   Procedure: MASTECTOMY WITH SENTINEL LYMPH NODE BIOPSY;  Surgeon: Robert Bellow, MD;  Location: [facility];  Service: General;  Laterality: Right;  . PARTIAL HIP ARTHROPLASTY Right   . REMOVAL OF BILATERAL TISSUE EXPANDERS WITH PLACEMENT OF BILATERAL BREAST IMPLANTS Right 11/06/2017   Procedure: REMOVAL OF TISSUE EXPANDER WITH PLACEMENT OF BREAST IMPLANT;  Surgeon: Wallace Going, DO;  Location: [facility];  Service: Plastics;  Laterality: Right;  . TONSILLECTOMY      Her family history includes Atrial fibrillation in her father; Cancer in her brother and mother; Colon cancer in her mother; Diabetes in her father; Healthy in her son and son; Heart disease in her father; Hypertension in her father. There is no history of Breast cancer.   Current Outpatient Medications:  .  acetaminophen (TYLENOL) 500 MG tablet, Take 500 mg by mouth every 6 (six) hours as needed., Disp: , Rfl:  .  ARIPiprazole (ABILIFY) 2 MG tablet, TAKE 1 TABLET (2 MG TOTAL) BY MOUTH EVERY EVENING., Disp: 90 tablet, Rfl: 2 .  aspirin 81 MG tablet, Take 81 mg by mouth daily., Disp: ,  Rfl:  .  atorvastatin (LIPITOR) 20 MG tablet, Take 1 tablet (20 mg total) by mouth daily., Disp: 90 tablet, Rfl: 1 .  bismuth subsalicylate (PEPTO BISMOL) 262 MG chewable tablet, Chew 262 mg by mouth as needed. **capsule**, Disp: , Rfl:  .  calcium carbonate (TUMS - DOSED IN MG ELEMENTAL CALCIUM) 500 MG chewable tablet, Chew 1 tablet by  mouth as needed for indigestion or heartburn., Disp: , Rfl:  .  clindamycin (CLEOCIN) 150 MG capsule, Take 6,000 mg by mouth. One hour prior to dental procedure, Disp: , Rfl:  .  lisinopril (PRINIVIL,ZESTRIL) 5 MG tablet, TAKE 1 TABLET BY MOUTH EVERY DAY, Disp: 90 tablet, Rfl: 1 .  Multiple Vitamins-Minerals (CENTRUM SILVER PO), Take 1 tablet by mouth daily., Disp: , Rfl:  .  omeprazole (PRILOSEC) 40 MG capsule, Take 40 mg by mouth daily before breakfast., Disp: , Rfl:  .  oxybutynin (DITROPAN-XL) 10 MG 24 hr tablet, TAKE 1 TABLET BY MOUTH EVERYDAY AT BEDTIME, Disp: 90 tablet, Rfl: 1 .  Venlafaxine HCl 225 MG TB24, TAKE 1 TABLET (225 MG TOTAL) BY MOUTH DAILY., Disp: 90 tablet, Rfl: 2  Patient Care Team: Virginia Crews, MD as PCP - General (Family Medicine) Watt Climes, PA as Physician Assistant (Physician Assistant) Marry Guan, Laurice Record, MD as Consulting Physician (Orthopedic Surgery) Pa, Faith as Consulting Physician (Optometry) Bary Castilla, Forest Gleason, MD (General Surgery) Dillingham, Loel Lofty, DO as Attending Physician (Plastic Surgery) Manya Silvas, MD (Gastroenterology) Dasher, Rayvon Char, MD (Dermatology)     Objective:    Vitals: BP 136/84 (BP Location: Right Arm, Patient Position: Sitting, Cuff Size: Normal)   Pulse 89   Temp 98.3 F (36.8 C) (Oral)   Ht 5\' 5"  (1.651 m)   Wt 178 lb 9.6 oz (81 kg)   SpO2 97%   BMI 29.72 kg/m   Physical Exam Vitals signs reviewed.  Constitutional:      General: She is not in acute distress.    Appearance: Normal appearance. She is not diaphoretic.  HENT:     Head: Normocephalic and atraumatic.      Right Ear: Tympanic membrane, ear canal and external ear normal.     Left Ear: Tympanic membrane, ear canal and external ear normal.     Nose: Nose normal. No congestion.     Mouth/Throat:     Mouth: Mucous membranes are moist.     Pharynx: Oropharynx is clear.  Eyes:     General: No scleral icterus.    Conjunctiva/sclera: Conjunctivae normal.     Pupils: Pupils are equal, round, and reactive to light.  Neck:     Musculoskeletal: Neck supple.  Cardiovascular:     Rate and Rhythm: Normal rate and regular rhythm.     Pulses: Normal pulses.     Heart sounds: Normal heart sounds. No murmur.  Pulmonary:     Effort: Pulmonary effort is normal. No respiratory distress.     Breath sounds: Normal breath sounds. No wheezing or rhonchi.  Abdominal:     General: There is no distension.     Palpations: Abdomen is soft.     Tenderness: There is no abdominal tenderness.  Musculoskeletal:     Right lower leg: No edema.     Left lower leg: No edema.     Comments: B/l feet: No swelling or tenderness over 1st MTPs. Loss of transverse arch with TTP over 2nd and 3rd metatarsal heads.    Lymphadenopathy:     Cervical: No cervical adenopathy.  Skin:    General: Skin is warm and dry.     Capillary Refill: Capillary refill takes less than 2 seconds.     Findings: No rash.  Neurological:     Mental Status: She is alert and oriented to person, place, and time. Mental status is at baseline.  Psychiatric:  Mood and Affect: Mood normal.        Behavior: Behavior normal.     Diabetic Foot Exam - Simple   Simple Foot Form Diabetic Foot exam was performed with the following findings:  Yes 03/14/2018  4:43 PM  Visual Inspection No deformities, no ulcerations, no other skin breakdown bilaterally:  Yes Sensation Testing See comments:  Yes Pulse Check Posterior Tibialis and Dorsalis pulse intact bilaterally:  Yes Comments Decreased sensation to monofilament testing over bilateral toes       Activities of Daily Living In your present state of health, do you have any difficulty performing the following activities: 03/12/2018 11/04/2017  Hearing? N N  Vision? N N  Comment Wears eye glasses daily.  -  Difficulty concentrating or making decisions? N N  Walking or climbing stairs? Y N  Comment Due to left knee pains.  -  Dressing or bathing? N N  Doing errands, shopping? N N  Preparing Food and eating ? N -  Using the Toilet? N -  In the past six months, have you accidently leaked urine? N -  Do you have problems with loss of bowel control? Y -  Comment Occasionally after bowl movements with pressure.  -  Managing your Medications? N -  Managing your Finances? N -  Housekeeping or managing your Housekeeping? N -  Some recent data might be hidden    Fall Risk Assessment Fall Risk  03/12/2018 03/11/2017 01/24/2016  Falls in the past year? 0 No Yes  Number falls in past yr: - - 2 or more     Depression Screen PHQ 2/9 Scores 03/12/2018 09/12/2017 06/12/2017 03/11/2017  PHQ - 2 Score 2 2 0 3  PHQ- 9 Score 9 7 3 8     6CIT Screen 03/12/2018  What Year? 0 points  What month? 0 points  What time? 0 points  Count back from 20 0 points  Months in reverse 2 points  Repeat phrase 0 points  Total Score 2       Assessment & Plan:    Annual Physical Reviewed patient's Family Medical History Reviewed and updated list of patient's medical providers Assessment of cognitive impairment was done Assessed patient's functional ability Established a written schedule for health screening [place] Completed and Reviewed  Exercise Activities and Dietary recommendations Goals    . DIET - INCREASE WATER INTAKE     Recommend to increase water intake to 4-6 glasses a day.     . Exercise 3x per week (30 min per time)     Recommend to exercise for 3 days a week for at least 30 minutes at a time.         Immunization History  Administered Date(s) Administered   . Influenza Split 10/24/2011  . Influenza, High Dose Seasonal PF 11/18/2013, 01/24/2016, 10/08/2016, 09/12/2017  . Influenza-Unspecified 10/24/2011, 10/14/2012, 11/18/2013  . Pneumococcal Conjugate-13 08/21/2013  . Pneumococcal Polysaccharide-23 01/24/2016  . Tdap 10/24/2011  . Zoster Recombinat (Shingrix) 04/01/2016, 08/14/2016    Health Maintenance  Topic Date Due  . FOOT EXAM  03/11/2018  . HEMOGLOBIN A1C  03/15/2018  . OPHTHALMOLOGY EXAM  11/22/2018  . DEXA SCAN  03/09/2021  . TETANUS/TDAP  10/23/2021  . INFLUENZA VACCINE  Completed  . PNA vac Low Risk Adult  Completed     Discussed health benefits of physical activity, and encouraged her to engage in regular exercise appropriate for her age and condition.    ------------------------------------------------------------------------------------------------------------  Problem List  Items Addressed This Visit      Cardiovascular and Mediastinum   Essential hypertension    Well-controlled Continue lisinopril Recheck metabolic panel Follow-up in 6 months      Relevant Orders   Comprehensive metabolic panel     Respiratory   Emphysema lung ([place])    Patient with very small smoking history but a lot of lifetime smoking exposure Emphysema noted on lung CT Now having shortness of breath and chronic cough with wheezing We will start Spiriva and albuterol as needed and see if this helps with her symptoms Follow-up in 3 months      Relevant Medications   tiotropium (SPIRIVA) 18 MCG inhalation capsule   albuterol (PROVENTIL HFA;VENTOLIN HFA) 108 (90 Base) MCG/ACT inhaler   Abnormality of lung    Noted to have some nodularity in left lung base thought to be related to recent infection on CT chest in 01/2018 Needs repeat chest CT in 07/2018 given history of primary malignancies        Endocrine   Subclinical hypothyroidism     Medications Asymptomatic Recheck TSH and free T4      Relevant Orders   TSH   T4, free    Type 2 diabetes mellitus ([place])    Chronic and previously well controlled Diet controlled on no medications Up-to-date on vaccinations and screenings On ACE inhibitor Recheck A1c Foot exam completed today Discussed diet and exercise Follow-up in 6 months      Relevant Orders   Hemoglobin A1c   Diabetic peripheral neuropathy associated with type 2 diabetes mellitus ([place])    New problem Patient has had some symptoms for a while, but has not discussed it with [place] Discussed importance of checking her feet every night before she goes to bed Discussed possibility of using gabapentin, but she does not feel like this is bad enough to take a medication for it at this time We will continue to monitor        Musculoskeletal and Integument   Swelling of first metatarsophalangeal (MTP) joint    None present currently, but history of intermittent swelling at the MTP with redness, warmth, and exquisite tenderness is concerning for possible gout Patient does not have any renal disease and needs a low purine diet Check uric acid level Consider colchicine as needed versus prophylactic medication if she is having a lot of flares She will try to come in to be evaluated if this flares again in the future      Relevant Orders   Uric acid     Other   Hypercholesterolemia    Doing well on atorvastatin which we will continue Recheck lipid panel and CMP      Relevant Orders   Comprehensive metabolic panel   Lipid panel   Absolute anemia    Recheck CBC Could be related to her lightheadedness      Relevant Orders   CBC w/Diff/Platelet   Overweight    Discussed healthy weight management, diet, exercise      Ductal carcinoma in situ (DCIS) of right breast    Status post mastectomy with reconstruction Followed by general surgery Next visit and mammogram in 04/2018      Metatarsalgia of both feet    Patient with loss of transverse arch and metatarsal fat pad now with chronic  metatarsalgia bilateral feet Discussed importance of metatarsal pads and arch support Discussed the importance of supportive shoes Could consider podiatry referral in the future if needed  Other Visit Diagnoses    Encounter for annual physical exam    -  Primary       Return in about 3 months (around 06/12/2018) for ephysema and foot pain f/u.   The entirety of the information documented in the History of Present Illness, Review of Systems and Physical Exam were personally obtained by me. Portions of this information were initially documented by Natasha Vasquez, CMA and reviewed by me for thoroughness and accuracy.    Virginia Crews, MD, MPH [facility] 03/14/2018 5:01 PM

## 2018-03-14 NOTE — Patient Instructions (Addendum)
Metatarsal pads  New inhaler daily and as needed    Preventive Care 19 Years and Older, Female Preventive care refers to lifestyle choices and visits with your health care provider that can promote health and wellness. What does preventive care include?  A yearly physical exam. This is also called an annual well check.  Dental exams once or twice a year.  Routine eye exams. Ask your health care provider how often you should have your eyes checked.  Personal lifestyle choices, including: ? Daily care of your teeth and gums. ? Regular physical activity. ? Eating a healthy diet. ? Avoiding tobacco and drug use. ? Limiting alcohol use. ? Practicing safe sex. ? Taking low-dose aspirin every day. ? Taking vitamin and mineral supplements as recommended by your health care provider. What happens during an annual well check? The services and screenings done by your health care provider during your annual well check will depend on your age, overall health, lifestyle risk factors, and family history of disease. Counseling Your health care provider may ask you questions about your:  Alcohol use.  Tobacco use.  Drug use.  Emotional well-being.  Home and relationship well-being.  Sexual activity.  Eating habits.  History of falls.  Memory and ability to understand (cognition).  Work and work Statistician.  Reproductive health.  Screening You may have the following tests or measurements:  Height, weight, and BMI.  Blood pressure.  Lipid and cholesterol levels. These may be checked every 5 years, or more frequently if you are over 43 years old.  Skin check.  Lung cancer screening. You may have this screening every year starting at age 30 if you have a 30-pack-year history of smoking and currently smoke or have quit within the past 15 years.  Colorectal cancer screening. All adults should have this screening starting at age 9 and continuing until age 53. You will have  tests every 1-10 years, depending on your results and the type of screening test. People at increased risk should start screening at an earlier age. Screening tests may include: ? Guaiac-based fecal occult blood testing. ? Fecal immunochemical test (FIT). ? Stool DNA test. ? Virtual colonoscopy. ? Sigmoidoscopy. During this test, a flexible tube with a tiny camera (sigmoidoscope) is used to examine your rectum and lower colon. The sigmoidoscope is inserted through your anus into your rectum and lower colon. ? Colonoscopy. During this test, a long, thin, flexible tube with a tiny camera (colonoscope) is used to examine your entire colon and rectum.  Hepatitis C blood test.  Hepatitis B blood test.  Sexually transmitted disease (STD) testing.  Diabetes screening. This is done by checking your blood sugar (glucose) after you have not eaten for a while (fasting). You may have this done every 1-3 years.  Bone density scan. This is done to screen for osteoporosis. You may have this done starting at age 52.  Mammogram. This may be done every 1-2 years. Talk to your health care provider about how often you should have regular mammograms. Talk with your health care provider about your test results, treatment options, and if necessary, the need for more tests. Vaccines Your health care provider may recommend certain vaccines, such as:  Influenza vaccine. This is recommended every year.  Tetanus, diphtheria, and acellular pertussis (Tdap, Td) vaccine. You may need a Td booster every 10 years.  Varicella vaccine. You may need this if you have not been vaccinated.  Zoster vaccine. You may need this after age 20.  Measles, mumps, and rubella (MMR) vaccine. You may need at least one dose of MMR if you were born in 1957 or later. You may also need a second dose.  Pneumococcal 13-valent conjugate (PCV13) vaccine. One dose is recommended after age 54.  Pneumococcal polysaccharide (PPSV23) vaccine.  One dose is recommended after age 7.  Meningococcal vaccine. You may need this if you have certain conditions.  Hepatitis A vaccine. You may need this if you have certain conditions or if you travel or work in places where you may be exposed to hepatitis A.  Hepatitis B vaccine. You may need this if you have certain conditions or if you travel or work in places where you may be exposed to hepatitis B.  Haemophilus influenzae type b (Hib) vaccine. You may need this if you have certain conditions. Talk to your health care provider about which screenings and vaccines you need and how often you need them. This information is not intended to replace advice given to you by your health care provider. Make sure you discuss any questions you have with your health care provider. Document Released: 01/28/2015 Document Revised: 02/21/2017 Document Reviewed: 11/02/2014 Elsevier Interactive Patient Education  2019 [employer].

## 2018-03-14 NOTE — Assessment & Plan Note (Signed)
Doing well on atorvastatin which we will continue Recheck lipid panel and CMP

## 2018-03-14 NOTE — Assessment & Plan Note (Signed)
Chronic and previously well controlled Diet controlled on no medications Up-to-date on vaccinations and screenings On ACE inhibitor Recheck A1c Foot exam completed today Discussed diet and exercise Follow-up in 6 months

## 2018-03-14 NOTE — Assessment & Plan Note (Signed)
Recheck CBC Could be related to her lightheadedness

## 2018-03-15 ENCOUNTER — Telehealth: Payer: Self-pay

## 2018-03-15 LAB — CBC WITH DIFFERENTIAL/PLATELET
Basophils Absolute: 0.1 10*3/uL (ref 0.0–0.2)
Basos: 2 %
EOS (ABSOLUTE): 0.5 10*3/uL — ABNORMAL HIGH (ref 0.0–0.4)
Eos: 8 %
Hematocrit: 32.5 % — ABNORMAL LOW (ref 34.0–46.6)
Hemoglobin: 9.5 g/dL — ABNORMAL LOW (ref 11.1–15.9)
Immature Grans (Abs): 0 10*3/uL (ref 0.0–0.1)
Immature Granulocytes: 1 %
Lymphocytes Absolute: 1.8 10*3/uL (ref 0.7–3.1)
Lymphs: 26 %
MCH: 21.2 pg — ABNORMAL LOW (ref 26.6–33.0)
MCHC: 29.2 g/dL — ABNORMAL LOW (ref 31.5–35.7)
MCV: 72 fL — ABNORMAL LOW (ref 79–97)
Monocytes Absolute: 0.6 10*3/uL (ref 0.1–0.9)
Monocytes: 9 %
Neutrophils Absolute: 3.9 10*3/uL (ref 1.4–7.0)
Neutrophils: 54 %
Platelets: 429 10*3/uL (ref 150–450)
RBC: 4.49 x10E6/uL (ref 3.77–5.28)
RDW: 17.6 % — ABNORMAL HIGH (ref 11.7–15.4)
WBC: 7 10*3/uL (ref 3.4–10.8)

## 2018-03-15 LAB — LIPID PANEL
Chol/HDL Ratio: 4.7 ratio — ABNORMAL HIGH (ref 0.0–4.4)
Cholesterol, Total: 189 mg/dL (ref 100–199)
HDL: 40 mg/dL (ref >39)
LDL Calculated: 107 mg/dL — ABNORMAL HIGH (ref 0–99)
Triglycerides: 212 mg/dL — ABNORMAL HIGH (ref 0–149)
VLDL Cholesterol Cal: 42 mg/dL — ABNORMAL HIGH (ref 5–40)

## 2018-03-15 LAB — HEMOGLOBIN A1C
Est. average glucose Bld gHb Est-mCnc: 123 mg/dL
Hgb A1c MFr Bld: 5.9 % — ABNORMAL HIGH (ref 4.8–5.6)

## 2018-03-15 LAB — COMPREHENSIVE METABOLIC PANEL
ALT: 12 IU/L (ref 0–32)
AST: 13 IU/L (ref 0–40)
Albumin/Globulin Ratio: 2 (ref 1.2–2.2)
Albumin: 4.4 g/dL (ref 3.7–4.7)
Alkaline Phosphatase: 98 IU/L (ref 39–117)
BUN/Creatinine Ratio: 16 (ref 12–28)
BUN: 13 mg/dL (ref 8–27)
Bilirubin Total: 0.2 mg/dL (ref 0.0–1.2)
CALCIUM: 9.4 mg/dL (ref 8.7–10.3)
CO2: 23 mmol/L (ref 20–29)
Chloride: 103 mmol/L (ref 96–106)
Creatinine, Ser: 0.81 mg/dL (ref 0.57–1.00)
GFR calc Af Amer: 81 mL/min/{1.73_m2} (ref >59)
GFR, EST NON AFRICAN AMERICAN: 70 mL/min/{1.73_m2} (ref >59)
Globulin, Total: 2.2 g/dL (ref 1.5–4.5)
Glucose: 84 mg/dL (ref 65–99)
Potassium: 4.3 mmol/L (ref 3.5–5.2)
Sodium: 142 mmol/L (ref 134–144)
Total Protein: 6.6 g/dL (ref 6.0–8.5)

## 2018-03-15 LAB — T4, FREE: Free T4: 0.9 ng/dL (ref 0.82–1.77)

## 2018-03-15 LAB — URIC ACID: Uric Acid: 6.7 mg/dL (ref 2.5–7.1)

## 2018-03-15 LAB — TSH: TSH: 2.74 u[IU]/mL (ref 0.450–4.500)

## 2018-03-15 NOTE — Telephone Encounter (Signed)
-----   Message from Virginia Crews, MD sent at 03/15/2018  9:11 AM EST ----- A1c is well controlled.  Normal kidney function, liver function, electrolytes, thyroid function.  Cholesterol is elevated. Taking atorvastatin regularly?  If yes, then we should increase Lipitor dose to 40mg  daily.  Anemia is stable but could be contributing to lightheadedness. Can we add on iron panel?  Is she taking iron supplement?

## 2018-03-15 NOTE — Telephone Encounter (Signed)
Patient was advised she states that she is taking atorvastatin daily, I told her at this time we would like to increase Lipitor and she states that [facility] uses [facility] [address]. I contacted [facility] and had added Iron, TIBC and Ferritin. KW

## 2018-03-17 ENCOUNTER — Telehealth: Payer: Self-pay

## 2018-03-17 NOTE — Telephone Encounter (Signed)
-----   Message from Virginia Crews, MD sent at 03/17/2018  9:57 AM EST ----- Patient does have iron deficiency.  Any bleeding noted with bowel movements, vaginally, or otherwise?  Start iron supplementation (ferrous sulfate 325mg  TID with meals).  Given h/o cancer and anemia, would recommend hematology/oncology referral as well.

## 2018-03-17 NOTE — Telephone Encounter (Signed)
Patient advised.KW 

## 2018-03-18 ENCOUNTER — Other Ambulatory Visit: Payer: Self-pay

## 2018-03-18 DIAGNOSIS — Z1231 Encounter for screening mammogram for malignant neoplasm of breast: Secondary | ICD-10-CM

## 2018-03-20 LAB — SPECIMEN STATUS REPORT

## 2018-03-20 LAB — IRON AND TIBC
Iron Saturation: 6 % — CL (ref 15–55)
Iron: 22 ug/dL — ABNORMAL LOW (ref 27–139)
Total Iron Binding Capacity: 362 ug/dL (ref 250–450)
UIBC: 340 ug/dL (ref 118–369)

## 2018-03-20 LAB — FERRITIN: Ferritin: 25 ng/mL (ref 15–150)

## 2018-04-03 ENCOUNTER — Telehealth: Payer: Self-pay | Admitting: Family Medicine

## 2018-04-03 DIAGNOSIS — E7800 Pure hypercholesterolemia, unspecified: Secondary | ICD-10-CM

## 2018-04-03 MED ORDER — ATORVASTATIN CALCIUM 40 MG PO TABS: 40.0000 mg | ORAL_TABLET | Freq: Every day | ORAL | 3 refills | Status: DC

## 2018-04-03 NOTE — Telephone Encounter (Signed)
Patient was advised and medication send to pharmacy. 

## 2018-04-03 NOTE — Telephone Encounter (Signed)
Ok to send Lipitor 40mg  daily #90 r3.  Advise her to take ferrous sulfate 325mg  daily

## 2018-04-03 NOTE — Telephone Encounter (Signed)
pt called saying Dr. Jacinto Reap told on her visit Mar 1 that her Lipator would be increased to 40 mg from 200 mg due to her chol being up....  She never got a prescription sent to the pharmacy for the 40 mg .  She also wanted to know what MG of Iron she should be taking due to her iron being low...  She uses [facility]  Pt's CB# [phone]

## 2018-04-29 ENCOUNTER — Ambulatory Visit: Payer: Medicare Other | Admitting: General Surgery

## 2018-05-16 ENCOUNTER — Telehealth: Payer: Self-pay

## 2018-05-16 DIAGNOSIS — I10 Essential (primary) hypertension: Secondary | ICD-10-CM

## 2018-05-16 MED ORDER — LISINOPRIL 5 MG PO TABS: 5.0000 mg | ORAL_TABLET | Freq: Every day | ORAL | 1 refills | Status: DC

## 2018-05-16 NOTE — Telephone Encounter (Signed)
Patient requested a on her lisinopril and medication was send in to pharmacy. L.O.V. 03/14/2018.

## 2018-06-05 ENCOUNTER — Ambulatory Visit: Payer: Medicare Other | Admitting: General Surgery

## 2018-06-10 ENCOUNTER — Ambulatory Visit: Payer: Medicare Other | Admitting: General Surgery

## 2018-06-11 ENCOUNTER — Other Ambulatory Visit: Payer: Self-pay

## 2018-06-11 ENCOUNTER — Ambulatory Visit
Admission: RE | Admit: 2018-06-11 | Discharge: 2018-06-11 | Disposition: A | Discharge status: Home / Self Care | Payer: Medicare Other | Source: Ambulatory Visit | Attending: General Surgery | Admitting: General Surgery

## 2018-06-11 DIAGNOSIS — Z1231 Encounter for screening mammogram for malignant neoplasm of breast: Secondary | ICD-10-CM | POA: Diagnosis not present

## 2018-06-13 ENCOUNTER — Ambulatory Visit: Payer: Medicare Other | Admitting: Family Medicine

## 2018-06-16 ENCOUNTER — Encounter: Payer: Self-pay | Admitting: Family Medicine

## 2018-06-16 ENCOUNTER — Ambulatory Visit (INDEPENDENT_AMBULATORY_CARE_PROVIDER_SITE_OTHER): Payer: Medicare Other | Admitting: Family Medicine

## 2018-06-16 DIAGNOSIS — J439 Emphysema, unspecified: Secondary | ICD-10-CM | POA: Diagnosis not present

## 2018-06-16 DIAGNOSIS — R918 Other nonspecific abnormal finding of lung field: Secondary | ICD-10-CM | POA: Diagnosis not present

## 2018-06-16 DIAGNOSIS — J984 Other disorders of lung: Secondary | ICD-10-CM | POA: Diagnosis not present

## 2018-06-16 MED ORDER — GUAIFENESIN ER 600 MG PO TB12: 600.0000 mg | ORAL_TABLET | Freq: Two times a day (BID) | ORAL | 3 refills | Status: DC

## 2018-06-16 MED ORDER — TIOTROPIUM BROMIDE MONOHYDRATE 18 MCG IN CAPS: 18.0000 ug | ORAL_CAPSULE | Freq: Every day | RESPIRATORY_TRACT | 3 refills | Status: DC

## 2018-06-16 NOTE — Assessment & Plan Note (Signed)
Noted to have some nodularity in L lung base thought to be related to recent infection on CT chest in 01/2018 Will order repeat CT chest for next month given h/o primary malignancies

## 2018-06-16 NOTE — Progress Notes (Signed)
Patient: Natasha Vasquez Female    DOB: 1940/02/22   78 y.o.   MRN: [id number] Visit Date: 06/16/2018  Today's Provider: Lavon Paganini, MD   Chief Complaint  Patient presents with  . Emphysema    follow up    Subjective:    Virtual Visit via Video Note  I connected with Natasha Vasquez on 06/16/18 at  2:00 PM EDT by a video enabled telemedicine application and verified that I am speaking with the correct person using two identifiers.   Patient location: home Provider location: [place] involved in the visit: patient, provider   I discussed the limitations of evaluation and management by telemedicine and the availability of in person appointments. The patient expressed understanding and agreed to proceed.  HPI Emphysema Lung: Patient presents for a 3 month follow up. Last OV was on 03/14/2018. Patient C/O SOB & chronic cough with wheezing. She started Spiriva & Albuterol PRN and advised to follow up in 3 months. Patient reports good compliance with treatment plan. She states SOB is improved but cough is unchanged. Feels like a deep cough, but mostly nonproductive.  Feels like something is breaking loose down in her lung. Given significant course of illness in December and January, wonders if she had COVID19 at that time  L knee continues to hurt. She had success from steroid injection in 09/2017. 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  Allergies  Allergen Reactions  . Codeine Other (See Comments)    Chest pain  . Lorazepam Other (See Comments)    "Feels loopy"  . Oxycodone Other (See Comments)    MENTAL CHANGES  . Paregoric     Chest pain---tolerated MORPHINE  . Penicillins Hives    Has patient had a PCN reaction causing immediate rash, facial/tongue/throat swelling, SOB or lightheadedness with hypotension: Yes Has patient had a PCN reaction causing severe rash involving mucus  membranes or skin necrosis: No Has patient had a PCN reaction that required hospitalization: NO Has patient had a PCN reaction occurring within the last 10 years: No If all of the above answers are "NO", then may proceed with Cephalosporin use.  Can take Augmentin  . Aleve [Naproxen Sodium] Hives, Rash and Other (See Comments)    headaches     Current Outpatient Medications:  .  acetaminophen (TYLENOL) 500 MG tablet, Take 500 mg by mouth every 6 (six) hours as needed., Disp: , Rfl:  .  albuterol (PROVENTIL HFA;VENTOLIN HFA) 108 (90 Base) MCG/ACT inhaler, Inhale 2 puffs into the lungs every 6 (six) hours as needed for wheezing or shortness of breath., Disp: 1 Inhaler, Rfl: 2 .  ARIPiprazole (ABILIFY) 2 MG tablet, TAKE 1 TABLET (2 MG TOTAL) BY MOUTH EVERY EVENING., Disp: 90 tablet, Rfl: 2 .  aspirin 81 MG tablet, Take 81 mg by mouth daily., Disp: , Rfl:  .  atorvastatin (LIPITOR) 40 MG tablet, Take 1 tablet (40 mg total) by mouth daily., Disp: 90 tablet, Rfl: 3 .  bismuth subsalicylate (PEPTO BISMOL) 262 MG chewable tablet, Chew 262 mg by mouth as needed. **capsule**, Disp: , Rfl:  .  calcium carbonate (TUMS - DOSED IN MG ELEMENTAL CALCIUM) 500 MG chewable tablet, Chew 1 tablet by mouth as needed for indigestion or heartburn., Disp: , Rfl:  .  clindamycin (CLEOCIN) 150 MG capsule, Take 6,000 mg by mouth. One hour prior to dental procedure, Disp: , Rfl:  .  lisinopril (ZESTRIL) 5 MG tablet, Take 1 tablet (5 mg total) by mouth daily., Disp: 90 tablet, Rfl: 1 .  Multiple Vitamins-Minerals (CENTRUM SILVER PO), Take 1 tablet by mouth daily., Disp: , Rfl:  .  omeprazole (PRILOSEC) 40 MG capsule, Take 40 mg by mouth daily before breakfast., Disp: , Rfl:  .  oxybutynin (DITROPAN-XL) 10 MG 24 hr tablet, TAKE 1 TABLET BY MOUTH EVERYDAY AT BEDTIME, Disp: 90 tablet, Rfl: 1 .  tiotropium (SPIRIVA) 18 MCG inhalation capsule, Place 1 capsule (18 mcg total) into inhaler and inhale daily., Disp: 30 capsule,  Rfl: 12 .  Venlafaxine HCl 225 MG TB24, TAKE 1 TABLET (225 MG TOTAL) BY MOUTH DAILY., Disp: 90 tablet, Rfl: 2  Review of Systems  Constitutional: Negative.   Respiratory: Positive for cough.   Cardiovascular: Negative.   Musculoskeletal: Negative.     Social History   Tobacco Use  . Smoking status: Former Smoker    Types: Cigarettes    Last attempt to quit: 1965    Years since quitting: 55.4  . Smokeless tobacco: Never Used  . Tobacco comment: smoked in college  Substance Use Topics  . Alcohol use: Yes    Alcohol/week: 7.0 - 14.0 standard drinks    Types: 7 - 14 Glasses of wine per week    Comment: 1-2 glasses of wine per night      Objective:   There were no vitals taken for this visit. There were no vitals filed for this visit.   Physical Exam Constitutional:      General: She is not in acute distress.    Appearance: Normal appearance.  Pulmonary:     Effort: Pulmonary effort is normal. No respiratory distress.  Neurological:     Mental Status: She is alert and oriented to person, place, and time. Mental status is at baseline.  Psychiatric:        Mood and Affect: Mood normal.        Behavior: Behavior normal.          Assessment & Plan    I discussed the assessment and treatment plan with the patient. The patient was provided an opportunity to ask questions and all were answered. The patient agreed with the plan and demonstrated an understanding of the instructions.   The patient was advised to call back or seek an in-person evaluation if the symptoms worsen or if the condition fails to improve as anticipated.  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    Patient with small personal smoking history, but significant 2nd hand smoke in lifetime Noted to have Emphysema Doing well on Spiriva - will continue Advised to use albuterol prn for chest tightness or wheezing Sounds to have some mucous plugging Will  trial mucinex for this F/u in 3 months      Relevant Medications   tiotropium (SPIRIVA) 18 MCG inhalation capsule   guaiFENesin (MUCINEX) 600 MG 12 hr tablet   Abnormality of lung    Noted to have some nodularity in L lung base thought to be related to recent infection on CT chest in 01/2018 Will order repeat CT chest for next month given h/o primary malignancies       Other Visit Diagnoses    Abnormal findings on diagnostic imaging of lung       Relevant Orders   CT Chest Wo Contrast       Return in about 3  months (around 09/16/2018) for COPD f/u.   The entirety of the information documented in the History of Present Illness, Review of Systems and Physical Exam were personally obtained by me. Portions of this information were initially documented by Tiburcio Pea, CMA and reviewed by me for thoroughness and accuracy.    Barth Trella, Dionne Bucy, MD MPH [facility]

## 2018-06-16 NOTE — Assessment & Plan Note (Signed)
Patient with small personal smoking history, but significant 2nd hand smoke in lifetime Noted to have Emphysema Doing well on Spiriva - will continue Advised to use albuterol prn for chest tightness or wheezing Sounds to have some mucous plugging Will trial mucinex for this F/u in 3 months

## 2018-06-16 NOTE — Patient Instructions (Signed)
Chronic Obstructive Pulmonary Disease Chronic obstructive pulmonary disease (COPD) is a long-term (chronic) lung problem. When you have COPD, it is hard for air to get in and out of your lungs. Usually the condition gets worse over time, and your lungs will never return to normal. There are things you can do to keep yourself as healthy as possible.  Your doctor may treat your condition with: ? Medicines. ? Oxygen. ? Lung surgery.  Your doctor may also recommend: ? Rehabilitation. This includes steps to make your body work better. It may involve a team of specialists. ? Quitting smoking, if you smoke. ? Exercise and changes to your diet. ? Comfort measures (palliative care). Follow these instructions at home: Medicines  Take over-the-counter and prescription medicines only as told by your doctor.  Talk to your doctor before taking any cough or allergy medicines. You may need to avoid medicines that cause your lungs to be dry. Lifestyle  If you smoke, stop. Smoking makes the problem worse. If you need help quitting, ask your doctor.  Avoid being around things that make your breathing worse. This may include smoke, chemicals, and fumes.  Stay active, but remember to rest as well.  Learn and use tips on how to relax.  Make sure you get enough sleep. Most adults need at least 7 hours of sleep every night.  Eat healthy foods. Eat smaller meals more often. Rest before meals. Controlled breathing Learn and use tips on how to control your breathing as told by your doctor. Try:  Breathing in (inhaling) through your nose for 1 second. Then, pucker your lips and breath out (exhale) through your lips for 2 seconds.  Putting one hand on your belly (abdomen). Breathe in slowly through your nose for 1 second. Your hand on your belly should move out. Pucker your lips and breathe out slowly through your lips. Your hand on your belly should move in as you breathe out.  Controlled coughing Learn  and use controlled coughing to clear mucus from your lungs. Follow these steps: 1. Lean your head a little forward. 2. Breathe in deeply. 3. Try to hold your breath for 3 seconds. 4. Keep your mouth slightly open while coughing 2 times. 5. Spit any mucus out into a tissue. 6. Rest and do the steps again 1 or 2 times as needed. General instructions  Make sure you get all the shots (vaccines) that your doctor recommends. Ask your doctor about a flu shot and a pneumonia shot.  Use oxygen therapy and pulmonary rehabilitation if told by your doctor. If you need home oxygen therapy, ask your doctor if you should buy a tool to measure your oxygen level (oximeter).  Make a COPD action plan with your doctor. This helps you to know what to do if you feel worse than usual.  Manage any other conditions you have as told by your doctor.  Avoid going outside when it is very hot, cold, or humid.  Avoid people who have a sickness you can catch (contagious).  Keep all follow-up visits as told by your doctor. This is important. Contact a doctor if:  You cough up more mucus than usual.  There is a change in the color or thickness of the mucus.  It is harder to breathe than usual.  Your breathing is faster than usual.  You have trouble sleeping.  You need to use your medicines more often than usual.  You have trouble doing your normal activities such as getting dressed   or walking around the house. Get help right away if:  You have shortness of breath while resting.  You have shortness of breath that stops you from: ? Being able to talk. ? Doing normal activities.  Your chest hurts for longer than 5 minutes.  Your skin color is more blue than usual.  Your pulse oximeter shows that you have low oxygen for longer than 5 minutes.  You have a fever.  You feel too tired to breathe normally. Summary  Chronic obstructive pulmonary disease (COPD) is a long-term lung problem.  The way your  lungs work will never return to normal. Usually the condition gets worse over time. There are things you can do to keep yourself as healthy as possible.  Take over-the-counter and prescription medicines only as told by your doctor.  If you smoke, stop. Smoking makes the problem worse. This information is not intended to replace advice given to you by your health care provider. Make sure you discuss any questions you have with your health care provider. Document Released: 06/20/2007 Document Revised: 02/06/2016 Document Reviewed: 02/06/2016 Elsevier Interactive Patient Education  2019 [employer]  

## 2018-06-19 ENCOUNTER — Other Ambulatory Visit: Payer: Self-pay

## 2018-06-19 DIAGNOSIS — F4322 Adjustment disorder with anxiety: Secondary | ICD-10-CM

## 2018-06-19 DIAGNOSIS — E7800 Pure hypercholesterolemia, unspecified: Secondary | ICD-10-CM

## 2018-06-19 NOTE — Telephone Encounter (Signed)
Patient requesting refills on the following medication: ARIPiprazole (ABILIFY) 2 MG tablet atorvastatin (LIPITOR) 40 MG tablet   Pharmacy: [facility]

## 2018-06-20 ENCOUNTER — Other Ambulatory Visit: Payer: Medicare Other

## 2018-06-20 ENCOUNTER — Ambulatory Visit
Admission: RE | Admit: 2018-06-20 | Discharge: 2018-06-20 | Disposition: A | Discharge status: Home / Self Care | Payer: Medicare Other | Source: Ambulatory Visit | Attending: Family Medicine | Admitting: Family Medicine

## 2018-06-20 ENCOUNTER — Encounter: Payer: Self-pay | Admitting: Family Medicine

## 2018-06-20 ENCOUNTER — Ambulatory Visit (INDEPENDENT_AMBULATORY_CARE_PROVIDER_SITE_OTHER): Payer: Medicare Other | Admitting: Family Medicine

## 2018-06-20 ENCOUNTER — Telehealth: Payer: Self-pay | Admitting: *Deleted

## 2018-06-20 DIAGNOSIS — R61 Generalized hyperhidrosis: Secondary | ICD-10-CM | POA: Diagnosis not present

## 2018-06-20 DIAGNOSIS — Z20822 Contact with and (suspected) exposure to covid-19: Secondary | ICD-10-CM

## 2018-06-20 DIAGNOSIS — M25532 Pain in left wrist: Secondary | ICD-10-CM

## 2018-06-20 DIAGNOSIS — M25432 Effusion, left wrist: Secondary | ICD-10-CM | POA: Insufficient documentation

## 2018-06-20 DIAGNOSIS — R6883 Chills (without fever): Secondary | ICD-10-CM

## 2018-06-20 MED ORDER — ATORVASTATIN CALCIUM 40 MG PO TABS: 40.0000 mg | ORAL_TABLET | Freq: Every day | ORAL | 3 refills | Status: DC

## 2018-06-20 MED ORDER — ARIPIPRAZOLE 2 MG PO TABS: 2.0000 mg | ORAL_TABLET | Freq: Every evening | ORAL | 2 refills | Status: DC

## 2018-06-20 NOTE — Progress Notes (Signed)
Patient: Natasha Vasquez Female    DOB: 1940-09-16   78 y.o.   MRN: [id number] Visit Date: 06/20/2018  Today's Provider: Lavon Paganini, MD   Chief Complaint  Patient presents with  . Wrist Pain   Subjective:    Virtual Visit via Video Note  I connected with Natasha Vasquez on 06/20/18 at  2:20 PM EDT by a video enabled telemedicine application and verified that I am speaking with the correct person using two identifiers.   Patient location: home Provider location: [place] involved in the visit: patient, provider   I discussed the limitations of evaluation and management by telemedicine and the availability of in person appointments. The patient expressed understanding and agreed to proceed.  Wrist Pain   The pain is present in the left wrist. This is a new problem. Episode onset: yesterday afternoon. There has been a history of trauma (fall yesterday). The problem occurs constantly. The problem has been gradually improving. Quality: throbbing. The pain is mild. Exacerbated by: movement and twisting. Treatments tried: ice. The treatment provided mild relief.   Fell yesterday in the grass after tripping over a root.  Felt fine initially.  Later that evening, noticed that L wrist was throbbing.  Applied ice.  +swelling. Pain is primary from thumb and index finger to wrist.  R hand dominant.  Had night sweats and chills during the night that soaked through and required her to change her PJs and sheets.  She states that this happened to her in December when she was sick.  She is worried about COVID19.  Denies SOB. Has longstanding cough since 12/19.  Allergies  Allergen Reactions  . Codeine Other (See Comments)    Chest pain  . Lorazepam Other (See Comments)    "Feels loopy"  . Oxycodone Other (See Comments)    MENTAL CHANGES  . Paregoric     Chest pain---tolerated MORPHINE  . Penicillins Hives    Has patient had a PCN reaction causing  immediate rash, facial/tongue/throat swelling, SOB or lightheadedness with hypotension: Yes Has patient had a PCN reaction causing severe rash involving mucus membranes or skin necrosis: No Has patient had a PCN reaction that required hospitalization: NO Has patient had a PCN reaction occurring within the last 10 years: No If all of the above answers are "NO", then may proceed with Cephalosporin use.  Can take Augmentin  . Aleve [Naproxen Sodium] Hives, Rash and Other (See Comments)    headaches     Current Outpatient Medications:  .  acetaminophen (TYLENOL) 500 MG tablet, Take 500 mg by mouth every 6 (six) hours as needed., Disp: , Rfl:  .  albuterol (PROVENTIL HFA;VENTOLIN HFA) 108 (90 Base) MCG/ACT inhaler, Inhale 2 puffs into the lungs every 6 (six) hours as needed for wheezing or shortness of breath., Disp: 1 Inhaler, Rfl: 2 .  ARIPiprazole (ABILIFY) 2 MG tablet, Take 1 tablet (2 mg total) by mouth every evening., Disp: 90 tablet, Rfl: 2 .  aspirin 81 MG tablet, Take 81 mg by mouth daily., Disp: , Rfl:  .  atorvastatin (LIPITOR) 40 MG tablet, Take 1 tablet (40 mg total) by mouth daily., Disp: 90 tablet, Rfl: 3 .  bismuth subsalicylate (PEPTO BISMOL) 262 MG chewable tablet, Chew 262 mg by mouth as needed. **capsule**, Disp: , Rfl:  .  calcium carbonate (TUMS - DOSED IN MG ELEMENTAL CALCIUM) 500 MG chewable tablet, Chew 1 tablet by mouth as needed for indigestion or heartburn.,  Disp: , Rfl:  .  clindamycin (CLEOCIN) 150 MG capsule, Take 6,000 mg by mouth. One hour prior to dental procedure, Disp: , Rfl:  .  guaiFENesin (MUCINEX) 600 MG 12 hr tablet, Take 1 tablet (600 mg total) by mouth 2 (two) times daily., Disp: 30 tablet, Rfl: 3 .  lisinopril (ZESTRIL) 5 MG tablet, Take 1 tablet (5 mg total) by mouth daily., Disp: 90 tablet, Rfl: 1 .  Multiple Vitamins-Minerals (CENTRUM SILVER PO), Take 1 tablet by mouth daily., Disp: , Rfl:  .  omeprazole (PRILOSEC) 40 MG capsule, Take 40 mg by mouth  daily before breakfast., Disp: , Rfl:  .  oxybutynin (DITROPAN-XL) 10 MG 24 hr tablet, TAKE 1 TABLET BY MOUTH EVERYDAY AT BEDTIME, Disp: 90 tablet, Rfl: 1 .  tiotropium (SPIRIVA) 18 MCG inhalation capsule, Place 1 capsule (18 mcg total) into inhaler and inhale daily., Disp: 90 capsule, Rfl: 3 .  Venlafaxine HCl 225 MG TB24, TAKE 1 TABLET (225 MG TOTAL) BY MOUTH DAILY., Disp: 90 tablet, Rfl: 2  Review of Systems  Constitutional: Negative.   Respiratory: Negative.   Cardiovascular: Negative.   Musculoskeletal: Positive for arthralgias.    Social History   Tobacco Use  . Smoking status: Former Smoker    Types: Cigarettes    Last attempt to quit: 1965    Years since quitting: 55.4  . Smokeless tobacco: Never Used  . Tobacco comment: smoked in college  Substance Use Topics  . Alcohol use: Yes    Alcohol/week: 7.0 - 14.0 standard drinks    Types: 7 - 14 Glasses of wine per week    Comment: 1-2 glasses of wine per night      Objective:   There were no vitals taken for this visit. There were no vitals filed for this visit.   Physical Exam Constitutional:      Appearance: Normal appearance.  Pulmonary:     Effort: Pulmonary effort is normal. No respiratory distress.  Musculoskeletal:     Comments: Full ROM of L wrist that is painful. Swelling noted and bruising along base of index finger  Neurological:     Mental Status: She is alert and oriented to person, place, and time. Mental status is at baseline.  Psychiatric:        Mood and Affect: Mood normal.        Behavior: Behavior normal.        Assessment & Plan      I discussed the assessment and treatment plan with the patient. The patient was provided an opportunity to ask questions and all were answered. The patient agreed with the plan and demonstrated an understanding of the instructions.   The patient was advised to call back or seek an in-person evaluation if the symptoms worsen or if the condition fails to improve  as anticipated.  1. Pain and swelling of left wrist - after mechanical fall - given location of pain and mechanism of fall, concern for possible scaphoid fracture  - if XRay is normal, may repeat in 1 wk if pain persists to r/o occult fracture - discussed ice, elevation, antiinflammatories  2. Night sweats 3. Chills - new problem - no other symptoms to suspect COVID19 infection, but given taht she lives in an ALF, will go ahead with testing outpatient - icing, elevation, NSAIDs - return precautions discussed - discussed quarantine procedures   Return if symptoms worsen or fail to improve.   The entirety of the information documented in the History  of Present Illness, Review of Systems and Physical Exam were personally obtained by me. Portions of this information were initially documented by Tiburcio Pea, CMA and reviewed by me for thoroughness and accuracy.    Riannon Mukherjee, Dionne Bucy, MD MPH [facility]

## 2018-06-20 NOTE — Telephone Encounter (Signed)
I called pt after receiving a referral from Dr. Lavon Paganini with [facility] for COVID-19 testing.  I scheduled her for today at 3:15 at the [facility] at the [place] location.   I instructed her to stay in the car and to wear a mask.    Insurance is [employer]  I entered the order.

## 2018-06-23 ENCOUNTER — Telehealth: Payer: Self-pay

## 2018-06-23 LAB — NOVEL CORONAVIRUS, NAA: SARS-CoV-2, NAA: NOT DETECTED

## 2018-06-23 NOTE — Telephone Encounter (Signed)
Patient was advised.  

## 2018-06-23 NOTE — Telephone Encounter (Signed)
-----   Message from Virginia Crews, MD sent at 06/23/2018  8:16 AM EDT ----- No fracture in wrist or hand.  Significant arthritic changes, however.  How are symptoms?

## 2018-06-24 ENCOUNTER — Encounter: Payer: Self-pay | Admitting: General Surgery

## 2018-06-24 ENCOUNTER — Ambulatory Visit: Payer: Medicare Other | Admitting: Family Medicine

## 2018-06-24 ENCOUNTER — Encounter: Payer: Self-pay | Admitting: Family Medicine

## 2018-06-24 ENCOUNTER — Other Ambulatory Visit: Payer: Self-pay

## 2018-06-24 ENCOUNTER — Ambulatory Visit: Payer: Medicare Other | Admitting: General Surgery

## 2018-06-24 VITALS — BP 151/97 | HR 87 | Temp 97.8°F | Ht 67.0 in | Wt 177.0 lb

## 2018-06-24 VITALS — BP 146/88 | HR 83 | Temp 98.4°F | Wt 176.8 lb

## 2018-06-24 DIAGNOSIS — Z1231 Encounter for screening mammogram for malignant neoplasm of breast: Secondary | ICD-10-CM

## 2018-06-24 DIAGNOSIS — M1712 Unilateral primary osteoarthritis, left knee: Secondary | ICD-10-CM | POA: Diagnosis not present

## 2018-06-24 DIAGNOSIS — D0511 Intraductal carcinoma in situ of right breast: Secondary | ICD-10-CM | POA: Diagnosis not present

## 2018-06-24 NOTE — Progress Notes (Signed)
Patient: Natasha Vasquez Female    DOB: Oct 10, 1940   78 y.o.   MRN: [id number] Visit Date: 06/24/2018  Today's Provider: Lavon Paganini, MD   Chief Complaint  Patient presents with  . Left knee injection   Subjective:     HPI   Patient is here for left knee injection.   Worse in last month Fell on it recently Injection by Ortho in the past - only about once per year Had torn meniscus s/p arthroscopic repair by Dr Marry Guan. Noted extensive arthritis Not on any medications for it Worse with walking up stairs or hill  Allergies  Allergen Reactions  . Codeine Other (See Comments)    Chest pain  . Lorazepam Other (See Comments)    "Feels loopy"  . Oxycodone Other (See Comments)    MENTAL CHANGES  . Paregoric     Chest pain---tolerated MORPHINE  . Penicillins Hives    Has patient had a PCN reaction causing immediate rash, facial/tongue/throat swelling, SOB or lightheadedness with hypotension: Yes Has patient had a PCN reaction causing severe rash involving mucus membranes or skin necrosis: No Has patient had a PCN reaction that required hospitalization: NO Has patient had a PCN reaction occurring within the last 10 years: No If all of the above answers are "NO", then may proceed with Cephalosporin use.  Can take Augmentin  . Aleve [Naproxen Sodium] Hives, Rash and Other (See Comments)    headaches     Current Outpatient Medications:  .  acetaminophen (TYLENOL) 500 MG tablet, Take 500 mg by mouth every 6 (six) hours as needed., Disp: , Rfl:  .  albuterol (PROVENTIL HFA;VENTOLIN HFA) 108 (90 Base) MCG/ACT inhaler, Inhale 2 puffs into the lungs every 6 (six) hours as needed for wheezing or shortness of breath., Disp: 1 Inhaler, Rfl: 2 .  ARIPiprazole (ABILIFY) 2 MG tablet, Take 1 tablet (2 mg total) by mouth every evening., Disp: 90 tablet, Rfl: 2 .  aspirin 81 MG tablet, Take 81 mg by mouth daily., Disp: , Rfl:  .  atorvastatin (LIPITOR) 40 MG tablet, Take 1  tablet (40 mg total) by mouth daily., Disp: 90 tablet, Rfl: 3 .  bismuth subsalicylate (PEPTO BISMOL) 262 MG chewable tablet, Chew 262 mg by mouth as needed. **capsule**, Disp: , Rfl:  .  calcium carbonate (TUMS - DOSED IN MG ELEMENTAL CALCIUM) 500 MG chewable tablet, Chew 1 tablet by mouth as needed for indigestion or heartburn., Disp: , Rfl:  .  clindamycin (CLEOCIN) 150 MG capsule, Take 6,000 mg by mouth. One hour prior to dental procedure, Disp: , Rfl:  .  guaiFENesin (MUCINEX) 600 MG 12 hr tablet, Take 1 tablet (600 mg total) by mouth 2 (two) times daily., Disp: 30 tablet, Rfl: 3 .  lisinopril (ZESTRIL) 5 MG tablet, Take 1 tablet (5 mg total) by mouth daily., Disp: 90 tablet, Rfl: 1 .  Multiple Vitamins-Minerals (CENTRUM SILVER PO), Take 1 tablet by mouth daily., Disp: , Rfl:  .  omeprazole (PRILOSEC) 40 MG capsule, Take 40 mg by mouth daily before breakfast., Disp: , Rfl:  .  oxybutynin (DITROPAN-XL) 10 MG 24 hr tablet, TAKE 1 TABLET BY MOUTH EVERYDAY AT BEDTIME, Disp: 90 tablet, Rfl: 1 .  tiotropium (SPIRIVA) 18 MCG inhalation capsule, Place 1 capsule (18 mcg total) into inhaler and inhale daily., Disp: 90 capsule, Rfl: 3 .  Venlafaxine HCl 225 MG TB24, TAKE 1 TABLET (225 MG TOTAL) BY MOUTH DAILY., Disp: 90 tablet, Rfl:  2  Review of Systems  Constitutional: Negative for appetite change, chills, fatigue and fever.  Respiratory: Negative for chest tightness and shortness of breath.   Cardiovascular: Negative for chest pain and palpitations.  Gastrointestinal: Negative for abdominal pain, nausea and vomiting.  Musculoskeletal: Positive for arthralgias (left knee ).  Neurological: Negative for dizziness and weakness.    Social History   Tobacco Use  . Smoking status: Former Smoker    Types: Cigarettes    Last attempt to quit: 1965    Years since quitting: 55.4  . Smokeless tobacco: Never Used  . Tobacco comment: smoked in college  Substance Use Topics  . Alcohol use: Yes     Alcohol/week: 7.0 - 14.0 standard drinks    Types: 7 - 14 Glasses of wine per week    Comment: 1-2 glasses of wine per night      Objective:   BP (!) 146/88 (BP Location: Left Arm, Patient Position: Sitting, Cuff Size: Normal)   Pulse 83   Temp 98.4 F (36.9 C) (Oral)   Wt 176 lb 12.8 oz (80.2 kg)   SpO2 96%   BMI 29.42 kg/m  Vitals:   06/24/18 0942  BP: (!) 146/88  Pulse: 83  Temp: 98.4 F (36.9 C)  TempSrc: Oral  SpO2: 96%  Weight: 176 lb 12.8 oz (80.2 kg)     Physical Exam Constitutional:      Appearance: Normal appearance.  HENT:     Head: Normocephalic and atraumatic.  Eyes:     Conjunctiva/sclera: Conjunctivae normal.  Pulmonary:     Effort: Pulmonary effort is normal. No respiratory distress.  Musculoskeletal:     Comments: L knee: No effusion, some minor swelling. ROM intact. +crepitus. Ligaments with stable endpoints  Skin:    General: Skin is warm and dry.     Findings: No rash.  Neurological:     Mental Status: She is alert and oriented to person, place, and time. Mental status is at baseline.  Psychiatric:        Mood and Affect: Mood normal.        Behavior: Behavior normal.         Assessment & Plan   Problem List Items Addressed This Visit    None    Visit Diagnoses    Primary osteoarthritis of left knee    -  Primary    - chronic problem - has done well with steroid injection previously - discussed repeat injection today  INJECTION: Patient was given informed consent,. Appropriate time out was taken. Area prepped and draped in usual sterile fashion. 1 cc of depo-medrol 40 mg/ml plus  4 cc of 1% lidocaine without epinephrine was injected into the L knee using a(n) anterolateral approach. The patient tolerated the procedure well. There were no complications. Post procedure instructions were given.    Return if symptoms worsen or fail to improve.   The entirety of the information documented in the History of Present Illness, Review of  Systems and Physical Exam were personally obtained by me. Portions of this information were initially documented by Tiburcio Pea, CMA and reviewed by me for thoroughness and accuracy.    Criag Wicklund, Dionne Bucy, MD MPH [facility]

## 2018-06-24 NOTE — Patient Instructions (Signed)

## 2018-06-24 NOTE — Progress Notes (Signed)
Patient ID: Natasha Vasquez, female   DOB: [birth year], 78 y.o.   MRN: [id number]  Chief Complaint  Patient presents with  . Follow-up    mammogram     HPI Natasha Vasquez is a 78 y.o. female who presents for a breast evaluation. The most recent mammogram was done on 06/11/2018.   Patient does perform regular self breast checks and gets regular mammograms done.    HPI  Past Medical History:  Diagnosis Date  . Anemia in pregnancy   . Arthritis    hands  . Barrett esophagus   . 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  ER/PR NEGATIVE  . Cancer ([place])    melanoma  . Complication of anesthesia    20 years ago pts b/p dropped really low  . Depression   . Depression   . Diabetes mellitus without complication (HCC)    diet controlled  . Family history of adverse reaction to anesthesia    son PONV  . GERD (gastroesophageal reflux disease)   . Hypercholesteremia   . Hyperlipidemia   . Hypertension   . MVP (mitral valve prolapse)    followed by PCP  . Nephritis   . Nephritis   . S/P appendectomy   . Torn meniscus    left    Past Surgical History:  Procedure Laterality Date  . ABDOMINAL HYSTERECTOMY    . APPENDECTOMY    . BREAST BIOPSY Right 04/25/2017   retroareolar 10:00   wing clip   path pending  . BREAST BIOPSY Right 04/25/2017   10:00  5CMFN  venus clip    path pending  . BREAST BIOPSY Right 05/03/2017   Affirm Bx- path pending  . BREAST CYST ASPIRATION Bilateral    NEG  . BREAST EXCISIONAL BIOPSY Left 20+ yrs ago   NEG  . BREAST RECONSTRUCTION WITH PLACEMENT OF TISSUE EXPANDER AND FLEX HD (ACELLULAR HYDRATED DERMIS) Right 06/03/2017   Procedure: BREAST RECONSTRUCTION WITH PLACEMENT OF TISSUE EXPANDER AND FLEX HD (ACELLULAR HYDRATED DERMIS);  Surgeon: Wallace Going, DO;  Location: [facility];  Service: Plastics;  Laterality: Right;  . BREAST REDUCTION SURGERY Left 11/06/2017   Procedure: BREAST REDUCTION;  Surgeon: Wallace Going, DO;  Location: [facility];  Service: Plastics;  Laterality: Left;  . BROW LIFT Bilateral 02/01/2015   Procedure: BLEPHAROPLASTY bilateral upper eyelids.;  Surgeon: Karle Starch, MD;  Location: [place];  Service: Ophthalmology;  Laterality: Bilateral;  DIABETIC - diet controlled  . CHOLECYSTECTOMY    . COLONOSCOPY    . COLONOSCOPY WITH PROPOFOL N/A 05/11/2015   Procedure: COLONOSCOPY WITH PROPOFOL;  Surgeon: Manya Silvas, MD;  Location: [facility];  Service: Endoscopy;  Laterality: N/A;  . ESOPHAGOGASTRODUODENOSCOPY    . ESOPHAGOGASTRODUODENOSCOPY (EGD) WITH PROPOFOL  05/11/2015   Procedure: ESOPHAGOGASTRODUODENOSCOPY (EGD) WITH PROPOFOL;  Surgeon: Manya Silvas, MD;  Location: [facility] ENDOSCOPY;  Service: Endoscopy;;  . FRACTURE SURGERY Right    arm and shoulder  . JOINT REPLACEMENT    . KNEE ARTHROSCOPY Left 03/14/2016   Procedure: ARTHROSCOPY KNEE, PARTIAL MEDIAL MENISECTOMY, CHONDROPLASTY;  Surgeon: Dereck Leep, MD;  Location: [facility];  Service: Orthopedics;  Laterality: Left;  Marland Kitchen MASTECTOMY Right 2019  . MASTECTOMY W/ SENTINEL NODE BIOPSY Right 06/03/2017   Procedure: MASTECTOMY WITH SENTINEL LYMPH NODE BIOPSY;  Surgeon: Robert Bellow, MD;  Location: [facility];  Service: General;  Laterality: Right;  . PARTIAL HIP ARTHROPLASTY Right   .  REDUCTION MAMMAPLASTY Left 2019  . REMOVAL OF BILATERAL TISSUE EXPANDERS WITH PLACEMENT OF BILATERAL BREAST IMPLANTS Right 11/06/2017   Procedure: REMOVAL OF TISSUE EXPANDER WITH PLACEMENT OF BREAST IMPLANT;  Surgeon: Wallace Going, DO;  Location: [facility];  Service: Plastics;  Laterality: Right;  . TONSILLECTOMY      Family History  Problem Relation Age of Onset  . Cancer Mother        colon/rectal  . Colon cancer Mother   . Heart disease Father   . Hypertension Father   . Atrial fibrillation Father   . Diabetes Father   . Cancer Brother        lung  . Healthy Son   . Healthy Son   . Breast cancer Neg Hx      Social History Social History   Tobacco Use  . Smoking status: Former Smoker    Types: Cigarettes    Last attempt to quit: 1965    Years since quitting: 55.4  . Smokeless tobacco: Never Used  . Tobacco comment: smoked in college  Substance Use Topics  . Alcohol use: Yes    Alcohol/week: 7.0 - 14.0 standard drinks    Types: 7 - 14 Glasses of wine per week    Comment: 1-2 glasses of wine per night  . Drug use: No    Allergies  Allergen Reactions  . Codeine Other (See Comments)    Chest pain  . Lorazepam Other (See Comments)    "Feels loopy"  . Oxycodone Other (See Comments)    MENTAL CHANGES  . Paregoric     Chest pain---tolerated MORPHINE  . Penicillins Hives    Has patient had a PCN reaction causing immediate rash, facial/tongue/throat swelling, SOB or lightheadedness with hypotension: Yes Has patient had a PCN reaction causing severe rash involving mucus membranes or skin necrosis: No Has patient had a PCN reaction that required hospitalization: NO Has patient had a PCN reaction occurring within the last 10 years: No If all of the above answers are "NO", then may proceed with Cephalosporin use.  Can take Augmentin  . Aleve [Naproxen Sodium] Hives, Rash and Other (See Comments)    headaches    Current Outpatient Medications  Medication Sig Dispense Refill  . acetaminophen (TYLENOL) 500 MG tablet Take 500 mg by mouth every 6 (six) hours as needed.    Marland Kitchen albuterol (PROVENTIL HFA;VENTOLIN HFA) 108 (90 Base) MCG/ACT inhaler Inhale 2 puffs into the lungs every 6 (six) hours as needed for wheezing or shortness of breath. 1 Inhaler 2  . ARIPiprazole (ABILIFY) 2 MG tablet Take 1 tablet (2 mg total) by mouth every evening. 90 tablet 2  . aspirin 81 MG tablet Take 81 mg by mouth daily.    Marland Kitchen atorvastatin (LIPITOR) 40 MG tablet Take 1 tablet (40 mg total) by mouth daily. 90 tablet 3  . bismuth subsalicylate (PEPTO BISMOL) 262 MG chewable tablet Chew 262 mg by mouth as needed.  **capsule**    . calcium carbonate (TUMS - DOSED IN MG ELEMENTAL CALCIUM) 500 MG chewable tablet Chew 1 tablet by mouth as needed for indigestion or heartburn.    . clindamycin (CLEOCIN) 150 MG capsule Take 6,000 mg by mouth. One hour prior to dental procedure    . guaiFENesin (MUCINEX) 600 MG 12 hr tablet Take 1 tablet (600 mg total) by mouth 2 (two) times daily. 30 tablet 3  . lisinopril (ZESTRIL) 5 MG tablet Take 1 tablet (5 mg total) by mouth daily. [place]  tablet 1  . Multiple Vitamins-Minerals (CENTRUM SILVER PO) Take 1 tablet by mouth daily.    Marland Kitchen omeprazole (PRILOSEC) 40 MG capsule Take 40 mg by mouth daily before breakfast.    . oxybutynin (DITROPAN-XL) 10 MG 24 hr tablet TAKE 1 TABLET BY MOUTH EVERYDAY AT BEDTIME 90 tablet 1  . tiotropium (SPIRIVA) 18 MCG inhalation capsule Place 1 capsule (18 mcg total) into inhaler and inhale daily. 90 capsule 3  . Venlafaxine HCl 225 MG TB24 TAKE 1 TABLET (225 MG TOTAL) BY MOUTH DAILY. 90 tablet 2   No current facility-administered medications for this visit.     Review of Systems Review of Systems  Constitutional: Negative.   Respiratory: Negative.   Cardiovascular: Negative.     Blood pressure (!) 151/97, pulse 87, temperature 97.8 F (36.6 C), temperature source Skin, height 5\' 7"  (1.702 m), weight 177 lb (80.3 kg), SpO2 97 %.  Physical Exam Physical Exam Constitutional:      Appearance: She is well-developed.  Eyes:     General: No scleral icterus.    Conjunctiva/sclera: Conjunctivae normal.  Neck:     Musculoskeletal: Neck supple.  Cardiovascular:     Rate and Rhythm: Normal rate and regular rhythm.     Heart sounds: Normal heart sounds.  Pulmonary:     Effort: Pulmonary effort is normal.     Breath sounds: Normal breath sounds.  Chest:     Breasts:        Left: Normal.    Lymphadenopathy:     Cervical: No cervical adenopathy.     Upper Body:     Right upper body: No supraclavicular or axillary adenopathy.     Left upper  body: No supraclavicular or axillary adenopathy.  Skin:    General: Skin is warm and dry.  Neurological:     Mental Status: She is alert and oriented to person, place, and time.     Data Reviewed Right breast screening mammogram dated Jun 11, 2018 was individually reviewed.  Postsurgical changes.  BI-RADS-2.  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  Plan   The patient has been asked to return to the office in one year with left screening  mammogram.The patient is aware to call back for any questions or concerns.  HPI, Physical Exam, Assessment and Plan have been scribed under the direction and in the presence of Hervey Ard, MD.  Gaspar Cola, CMA  I have completed the exam and reviewed the above documentation for accuracy and completeness.  I agree with the above.  Haematologist has been used and any errors in dictation or transcription are unintentional.  Hervey Ard, M.D., F.A.C.S.  Forest Gleason Deontay Ladnier 06/25/2018, 7:46 AM

## 2018-06-24 NOTE — Patient Instructions (Signed)
The patient has been asked to return to the office in one year with left screening  mammogram.The patient is aware to call back for any questions or concerns.

## 2018-06-25 ENCOUNTER — Encounter: Payer: Self-pay | Admitting: General Surgery

## 2018-06-27 ENCOUNTER — Other Ambulatory Visit: Payer: Self-pay

## 2018-06-27 ENCOUNTER — Ambulatory Visit
Admission: RE | Admit: 2018-06-27 | Discharge: 2018-06-27 | Disposition: A | Discharge status: Home / Self Care | Payer: Medicare Other | Source: Ambulatory Visit | Attending: Family Medicine | Admitting: Family Medicine

## 2018-06-27 DIAGNOSIS — R918 Other nonspecific abnormal finding of lung field: Secondary | ICD-10-CM | POA: Insufficient documentation

## 2018-06-30 ENCOUNTER — Other Ambulatory Visit: Payer: Self-pay | Admitting: Physician Assistant

## 2018-06-30 DIAGNOSIS — I712 Thoracic aortic aneurysm, without rupture, unspecified: Secondary | ICD-10-CM

## 2018-06-30 NOTE — Progress Notes (Signed)
Ct surgery consult placed.

## 2018-07-08 ENCOUNTER — Other Ambulatory Visit: Payer: Self-pay

## 2018-07-08 ENCOUNTER — Ambulatory Visit: Payer: Medicare Other | Admitting: General Surgery

## 2018-07-08 ENCOUNTER — Ambulatory Visit (INDEPENDENT_AMBULATORY_CARE_PROVIDER_SITE_OTHER): Payer: Medicare Other

## 2018-07-08 ENCOUNTER — Encounter: Payer: Self-pay | Admitting: General Surgery

## 2018-07-08 VITALS — BP 160/97 | HR 86 | Temp 97.7°F | Ht 67.0 in | Wt 178.0 lb

## 2018-07-08 DIAGNOSIS — N6320 Unspecified lump in the left breast, unspecified quadrant: Secondary | ICD-10-CM | POA: Diagnosis not present

## 2018-07-08 NOTE — Patient Instructions (Signed)
The patient is aware to call back for any questions or new concerns.  

## 2018-07-08 NOTE — Progress Notes (Signed)
Patient ID: Natasha Vasquez, female   DOB: 11/13/1940, 78 y.o.   MRN: [id number]  Chief Complaint  Patient presents with  . Follow-up    HPI Katharyn Raima Geathers is a 78 y.o. female.  Here for follow up CT scan and a left breast ultrasound. She is here with her husband, Sonia Side.  The patient had had a CT scan in January and then in June to follow-up an episode of pneumonia that was slow to resolve.  This identified densities in the medial aspect of the left breast.  While these are likely related to her breast reduction for symmetry last year, it was elected to complete an ultrasound to look for any distinct cystic or solid lesions.  HPI  Past Medical History:  Diagnosis Date  . Anemia in pregnancy   . Arthritis    hands  . Barrett esophagus   . 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  ER/PR NEGATIVE  . Cancer ([place])    melanoma  . Complication of anesthesia    20 years ago pts b/p dropped really low  . Depression   . Depression   . Diabetes mellitus without complication (HCC)    diet controlled  . Family history of adverse reaction to anesthesia    son PONV  . GERD (gastroesophageal reflux disease)   . Hypercholesteremia   . Hyperlipidemia   . Hypertension   . MVP (mitral valve prolapse)    followed by PCP  . Nephritis   . Nephritis   . S/P appendectomy   . Torn meniscus    left    Past Surgical History:  Procedure Laterality Date  . ABDOMINAL HYSTERECTOMY    . APPENDECTOMY    . BREAST BIOPSY Right 04/25/2017   retroareolar 10:00   wing clip   path pending  . BREAST BIOPSY Right 04/25/2017   10:00  5CMFN  venus clip    path pending  . BREAST BIOPSY Right 05/03/2017   Affirm Bx- path pending  . BREAST CYST ASPIRATION Bilateral    NEG  . BREAST EXCISIONAL BIOPSY Left 20+ yrs ago   NEG  . BREAST RECONSTRUCTION WITH PLACEMENT OF TISSUE EXPANDER AND FLEX HD (ACELLULAR HYDRATED DERMIS) Right 06/03/2017   Procedure: BREAST RECONSTRUCTION WITH  PLACEMENT OF TISSUE EXPANDER AND FLEX HD (ACELLULAR HYDRATED DERMIS);  Surgeon: Wallace Going, DO;  Location: [facility];  Service: Plastics;  Laterality: Right;  . BREAST REDUCTION SURGERY Left 11/06/2017   Procedure: BREAST REDUCTION;  Surgeon: Wallace Going, DO;  Location: [facility];  Service: Plastics;  Laterality: Left;  . BROW LIFT Bilateral 02/01/2015   Procedure: BLEPHAROPLASTY bilateral upper eyelids.;  Surgeon: Karle Starch, MD;  Location: [facility];  Service: Ophthalmology;  Laterality: Bilateral;  DIABETIC - diet controlled  . CHOLECYSTECTOMY    . COLONOSCOPY    . COLONOSCOPY WITH PROPOFOL N/A 05/11/2015   Procedure: COLONOSCOPY WITH PROPOFOL;  Surgeon: Manya Silvas, MD;  Location[facility] ENDOSCOPY;  Service: Endoscopy;  Laterality: N/A;  . ESOPHAGOGASTRODUODENOSCOPY    . ESOPHAGOGASTRODUODENOSCOPY (EGD) WITH PROPOFOL  05/11/2015   Procedure: ESOPHAGOGASTRODUODENOSCOPY (EGD) WITH PROPOFOL;  Surgeon: Manya Silvas, MD;  Location: [facility] ENDOSCOPY;  Service: Endoscopy;;  . FRACTURE SURGERY Right    arm and shoulder  . JOINT REPLACEMENT    . KNEE ARTHROSCOPY Left 03/14/2016   Procedure: ARTHROSCOPY KNEE, PARTIAL MEDIAL MENISECTOMY, CHONDROPLASTY;  Surgeon: Dereck Leep, MD;  Location: [facility];  Service: Orthopedics;  Laterality: Left;  Marland Kitchen MASTECTOMY Right 2019  . MASTECTOMY W/ SENTINEL NODE BIOPSY Right 06/03/2017   Procedure: MASTECTOMY WITH SENTINEL LYMPH NODE BIOPSY;  Surgeon: Robert Bellow, MD;  Location: [facility];  Service: General;  Laterality: Right;  . PARTIAL HIP ARTHROPLASTY Right   . REDUCTION MAMMAPLASTY Left 2019  . REMOVAL OF BILATERAL TISSUE EXPANDERS WITH PLACEMENT OF BILATERAL BREAST IMPLANTS Right 11/06/2017   Procedure: REMOVAL OF TISSUE EXPANDER WITH PLACEMENT OF BREAST IMPLANT;  Surgeon: Wallace Going, DO;  Location: [facility];  Service: Plastics;  Laterality: Right;  . TONSILLECTOMY      Family History  Problem Relation Age of  Onset  . Cancer Mother        colon/rectal  . Colon cancer Mother   . Heart disease Father   . Hypertension Father   . Atrial fibrillation Father   . Diabetes Father   . Cancer Brother        lung  . Healthy Son   . Healthy Son   . Breast cancer Neg Hx     Social History Social History   Tobacco Use  . Smoking status: Former Smoker    Types: Cigarettes    Quit date: 1965    Years since quitting: 55.5  . Smokeless tobacco: Never Used  . Tobacco comment: smoked in college  Substance Use Topics  . Alcohol use: Yes    Alcohol/week: 7.0 - 14.0 standard drinks    Types: 7 - 14 Glasses of wine per week    Comment: 1-2 glasses of wine per night  . Drug use: No    Allergies  Allergen Reactions  . Codeine Other (See Comments)    Chest pain  . Lorazepam Other (See Comments)    "Feels loopy"  . Oxycodone Other (See Comments)    MENTAL CHANGES  . Paregoric     Chest pain---tolerated MORPHINE  . Penicillins Hives    Has patient had a PCN reaction causing immediate rash, facial/tongue/throat swelling, SOB or lightheadedness with hypotension: Yes Has patient had a PCN reaction causing severe rash involving mucus membranes or skin necrosis: No Has patient had a PCN reaction that required hospitalization: NO Has patient had a PCN reaction occurring within the last 10 years: No If all of the above answers are "NO", then may proceed with Cephalosporin use.  Can take Augmentin  . Aleve [Naproxen Sodium] Hives, Rash and Other (See Comments)    headaches    Current Outpatient Medications  Medication Sig Dispense Refill  . acetaminophen (TYLENOL) 500 MG tablet Take 500 mg by mouth every 6 (six) hours as needed.    Marland Kitchen albuterol (PROVENTIL HFA;VENTOLIN HFA) 108 (90 Base) MCG/ACT inhaler Inhale 2 puffs into the lungs every 6 (six) hours as needed for wheezing or shortness of breath. 1 Inhaler 2  . ARIPiprazole (ABILIFY) 2 MG tablet Take 1 tablet (2 mg total) by mouth every evening. 90  tablet 2  . aspirin 81 MG tablet Take 81 mg by mouth daily.    Marland Kitchen atorvastatin (LIPITOR) 40 MG tablet Take 1 tablet (40 mg total) by mouth daily. 90 tablet 3  . bismuth subsalicylate (PEPTO BISMOL) 262 MG chewable tablet Chew 262 mg by mouth as needed. **capsule**    . calcium carbonate (TUMS - DOSED IN MG ELEMENTAL CALCIUM) 500 MG chewable tablet Chew 1 tablet by mouth as needed for indigestion or heartburn.    . clindamycin (CLEOCIN) 150 MG capsule Take 6,000 mg by mouth. One  hour prior to dental procedure    . guaiFENesin (MUCINEX) 600 MG 12 hr tablet Take 1 tablet (600 mg total) by mouth 2 (two) times daily. 30 tablet 3  . lisinopril (ZESTRIL) 5 MG tablet Take 1 tablet (5 mg total) by mouth daily. 90 tablet 1  . Multiple Vitamins-Minerals (CENTRUM SILVER PO) Take 1 tablet by mouth daily.    Marland Kitchen omeprazole (PRILOSEC) 40 MG capsule Take 40 mg by mouth daily before breakfast.    . oxybutynin (DITROPAN-XL) 10 MG 24 hr tablet TAKE 1 TABLET BY MOUTH EVERYDAY AT BEDTIME 90 tablet 1  . tiotropium (SPIRIVA) 18 MCG inhalation capsule Place 1 capsule (18 mcg total) into inhaler and inhale daily. 90 capsule 3  . Venlafaxine HCl 225 MG TB24 TAKE 1 TABLET (225 MG TOTAL) BY MOUTH DAILY. 90 tablet 2   No current facility-administered medications for this visit.     Review of Systems Review of Systems  Constitutional: Negative.   Respiratory: Negative.   Cardiovascular: Negative.     Blood pressure (!) 160/97, pulse 86, temperature 97.7 F (36.5 C), temperature source Temporal, height 5\' 7"  (1.702 m), weight 178 lb (80.7 kg), SpO2 98 %.  Physical Exam Physical Exam Constitutional:      Appearance: Normal appearance.  Skin:    General: Skin is warm and dry.  Neurological:     Mental Status: She is alert and oriented to person, place, and time.  Psychiatric:        Behavior: Behavior normal.     Data Reviewed Ultrasound examination of the medial aspect of the left breast was undertaken in  radial and antiradial fashion.  Special attention was paid to the lower inner quadrant her most of the densities noted on the recently completed CT scan were evident.  No cystic or solid lesions were identified.  Significant scarring between the edge of the areola and the inframammary fold from her recent breast reduction are evident, but no abnormality suggestive of malignancy.  BI-RADS-1.  Assessment CT imaging changes related to scarring post breast reduction.  Plan The patient is aware to call back for any questions or new concerns. Follow up as scheduled.  HPI, assessment, plan and physical exam has been scribed under the direction and in the presence of Robert Bellow, MD. Karie Fetch, RN   I have completed the exam and reviewed the above documentation for accuracy and completeness.  I agree with the above.  Haematologist has been used and any errors in dictation or transcription are unintentional.  Hervey Ard, M.D., F.A.C.S.  Forest Gleason Festus Pursel 07/09/2018, 7:35 PM

## 2018-07-25 ENCOUNTER — Ambulatory Visit: Payer: Medicare Other | Admitting: Cardiothoracic Surgery

## 2018-07-28 ENCOUNTER — Ambulatory Visit: Payer: Medicare Other | Admitting: Cardiothoracic Surgery

## 2018-07-28 ENCOUNTER — Encounter: Payer: Self-pay | Admitting: Cardiothoracic Surgery

## 2018-07-28 ENCOUNTER — Other Ambulatory Visit: Payer: Self-pay

## 2018-07-28 VITALS — BP 152/84 | HR 99 | Temp 97.5°F | Ht 65.0 in | Wt 178.0 lb

## 2018-07-28 DIAGNOSIS — I712 Thoracic aortic aneurysm, without rupture, unspecified: Secondary | ICD-10-CM

## 2018-07-28 NOTE — Progress Notes (Signed)
Patient ID: Natasha Vasquez, female   DOB: [birth year], 78 y.o.   MRN: [id number]  Chief Complaint  Patient presents with  . Other    Referred By Dr. Jeannette Corpus Reason for Referral thoracic aortic aneurysm  HPI Location, Quality, Duration, Severity, Timing, Context, Modifying Factors, Associated Signs and Symptoms.  Natasha Vasquez is a 78 y.o. female.  This patient is a very pleasant 78 year old female who does carry a history of diabetes and hypertension who underwent surgery for a right breast carcinoma within the last year.  She has had several x-rays made over that timeframe including a CT scan that was performed in January and a subsequent scan performed about 6 months later.  The initial scan showed that there was a thoracic aortic aneurysm which measured approximately 4.2 cm and on her most recent scan measured 4.6 cm.  Patient does state that she has a history of hypertension.  Her blood pressure usually runs in the 120/70 range although today it was a little bit higher because she was anxious about this consultation.  She has not had any prior scans.  There is no prior history of thoracic aortic aneurysm.  She is also a non-smoker.  Of significance is that she does carry a past history of nephritis as a child.  She is completed her surgery for her breast carcinoma.  She is undergone definitive reconstructive surgery on the right and mammoplasty on the left   Past Medical History:  Diagnosis Date  . Anemia in pregnancy   . Arthritis    hands  . Barrett esophagus   . 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  ER/PR NEGATIVE  . Cancer ([place])    melanoma  . Complication of anesthesia    20 years ago pts b/p dropped really low  . Depression   . Depression   . Diabetes mellitus without complication (HCC)    diet controlled  . Family history of adverse reaction to anesthesia    son PONV  . GERD (gastroesophageal reflux disease)   . Hypercholesteremia   .  Hyperlipidemia   . Hypertension   . MVP (mitral valve prolapse)    followed by PCP  . Nephritis   . Nephritis   . S/P appendectomy   . Torn meniscus    left    Past Surgical History:  Procedure Laterality Date  . ABDOMINAL HYSTERECTOMY    . APPENDECTOMY    . BREAST BIOPSY Right 04/25/2017   retroareolar 10:00   wing clip   path pending  . BREAST BIOPSY Right 04/25/2017   10:00  5CMFN  venus clip    path pending  . BREAST BIOPSY Right 05/03/2017   Affirm Bx- path pending  . BREAST CYST ASPIRATION Bilateral    NEG  . BREAST EXCISIONAL BIOPSY Left 20+ yrs ago   NEG  . BREAST RECONSTRUCTION WITH PLACEMENT OF TISSUE EXPANDER AND FLEX HD (ACELLULAR HYDRATED DERMIS) Right 06/03/2017   Procedure: BREAST RECONSTRUCTION WITH PLACEMENT OF TISSUE EXPANDER AND FLEX HD (ACELLULAR HYDRATED DERMIS);  Surgeon: Wallace Going, DO;  Location: [facility];  Service: Plastics;  Laterality: Right;  . BREAST REDUCTION SURGERY Left 11/06/2017   Procedure: BREAST REDUCTION;  Surgeon: Wallace Going, DO;  Location: [facility];  Service: Plastics;  Laterality: Left;  . BROW LIFT Bilateral 02/01/2015   Procedure: BLEPHAROPLASTY bilateral upper eyelids.;  Surgeon: Karle Starch, MD;  Location: [place];  Service: Ophthalmology;  Laterality:  Bilateral;  DIABETIC - diet controlled  . CHOLECYSTECTOMY    . COLONOSCOPY    . COLONOSCOPY WITH PROPOFOL N/A 05/11/2015   Procedure: COLONOSCOPY WITH PROPOFOL;  Surgeon: Manya Silvas, MD;  Location: [facility] ENDOSCOPY;  Service: Endoscopy;  Laterality: N/A;  . ESOPHAGOGASTRODUODENOSCOPY    . ESOPHAGOGASTRODUODENOSCOPY (EGD) WITH PROPOFOL  05/11/2015   Procedure: ESOPHAGOGASTRODUODENOSCOPY (EGD) WITH PROPOFOL;  Surgeon: Manya Silvas, MD;  Location: [facility] ENDOSCOPY;  Service: Endoscopy;;  . FRACTURE SURGERY Right    arm and shoulder  . JOINT REPLACEMENT    . KNEE ARTHROSCOPY Left 03/14/2016   Procedure: ARTHROSCOPY KNEE, PARTIAL MEDIAL MENISECTOMY,  CHONDROPLASTY;  Surgeon: Dereck Leep, MD;  Location: [facility];  Service: Orthopedics;  Laterality: Left;  Marland Kitchen MASTECTOMY Right 2019  . MASTECTOMY W/ SENTINEL NODE BIOPSY Right 06/03/2017   Procedure: MASTECTOMY WITH SENTINEL LYMPH NODE BIOPSY;  Surgeon: Robert Bellow, MD;  Location: [facility];  Service: General;  Laterality: Right;  . PARTIAL HIP ARTHROPLASTY Right   . REDUCTION MAMMAPLASTY Left 2019  . REMOVAL OF BILATERAL TISSUE EXPANDERS WITH PLACEMENT OF BILATERAL BREAST IMPLANTS Right 11/06/2017   Procedure: REMOVAL OF TISSUE EXPANDER WITH PLACEMENT OF BREAST IMPLANT;  Surgeon: Wallace Going, DO;  Location: [facility];  Service: Plastics;  Laterality: Right;  . TONSILLECTOMY      Family History  Problem Relation Age of Onset  . Cancer Mother        colon/rectal  . Colon cancer Mother   . Heart disease Father   . Hypertension Father   . Atrial fibrillation Father   . Diabetes Father   . Cancer Brother        lung  . Healthy Son   . Healthy Son   . Breast cancer Neg Hx     Social History Social History   Tobacco Use  . Smoking status: Former Smoker    Types: Cigarettes    Quit date: 1965    Years since quitting: 55.5  . Smokeless tobacco: Never Used  . Tobacco comment: smoked in college  Substance Use Topics  . Alcohol use: Yes    Alcohol/week: 7.0 - 14.0 standard drinks    Types: 7 - 14 Glasses of wine per week    Comment: 1-2 glasses of wine per night  . Drug use: No    Allergies  Allergen Reactions  . Codeine Other (See Comments)    Chest pain  . Lorazepam Other (See Comments)    "Feels loopy"  . Oxycodone Other (See Comments)    MENTAL CHANGES  . Paregoric     Chest pain---tolerated MORPHINE  . Penicillins Hives    Has patient had a PCN reaction causing immediate rash, facial/tongue/throat swelling, SOB or lightheadedness with hypotension: Yes Has patient had a PCN reaction causing severe rash involving mucus membranes or skin necrosis: No Has  patient had a PCN reaction that required hospitalization: NO Has patient had a PCN reaction occurring within the last 10 years: No If all of the above answers are "NO", then may proceed with Cephalosporin use.  Can take Augmentin  . Aleve [Naproxen Sodium] Hives, Rash and Other (See Comments)    headaches    Current Outpatient Medications  Medication Sig Dispense Refill  . acetaminophen (TYLENOL) 500 MG tablet Take 500 mg by mouth every 6 (six) hours as needed.    Marland Kitchen albuterol (PROVENTIL HFA;VENTOLIN HFA) 108 (90 Base) MCG/ACT inhaler Inhale 2 puffs into the lungs every 6 (six) hours as  needed for wheezing or shortness of breath. 1 Inhaler 2  . ARIPiprazole (ABILIFY) 2 MG tablet Take 1 tablet (2 mg total) by mouth every evening. 90 tablet 2  . aspirin 81 MG tablet Take 81 mg by mouth daily.    Marland Kitchen atorvastatin (LIPITOR) 40 MG tablet Take 1 tablet (40 mg total) by mouth daily. 90 tablet 3  . bismuth subsalicylate (PEPTO BISMOL) 262 MG chewable tablet Chew 262 mg by mouth as needed. **capsule**    . calcium carbonate (TUMS - DOSED IN MG ELEMENTAL CALCIUM) 500 MG chewable tablet Chew 1 tablet by mouth as needed for indigestion or heartburn.    . clindamycin (CLEOCIN) 150 MG capsule Take 6,000 mg by mouth. One hour prior to dental procedure    . guaiFENesin (MUCINEX) 600 MG 12 hr tablet Take 1 tablet (600 mg total) by mouth 2 (two) times daily. 30 tablet 3  . lisinopril (ZESTRIL) 5 MG tablet Take 1 tablet (5 mg total) by mouth daily. 90 tablet 1  . Multiple Vitamins-Minerals (CENTRUM SILVER PO) Take 1 tablet by mouth daily.    Marland Kitchen omeprazole (PRILOSEC) 40 MG capsule Take 40 mg by mouth daily before breakfast.    . oxybutynin (DITROPAN-XL) 10 MG 24 hr tablet TAKE 1 TABLET BY MOUTH EVERYDAY AT BEDTIME 90 tablet 1  . tiotropium (SPIRIVA) 18 MCG inhalation capsule Place 1 capsule (18 mcg total) into inhaler and inhale daily. 90 capsule 3  . Venlafaxine HCl 225 MG TB24 TAKE 1 TABLET (225 MG TOTAL) BY  MOUTH DAILY. 90 tablet 2   No current facility-administered medications for this visit.       Review of Systems A complete review of systems was asked and was negative except for the following positive findings cough, diabetes, high cholesterol, arthritis, glasses.  Blood pressure (!) 152/84, pulse 99, temperature (!) 97.5 F (36.4 C), temperature source Skin, height 5\' 5"  (1.651 m), weight 178 lb (80.7 kg), SpO2 97 %.  Physical Exam CONSTITUTIONAL:  Pleasant, well-developed, well-nourished, and in no acute distress. EYES: Pupils equal and reactive to light, Sclera non-icteric EARS, NOSE, MOUTH AND THROAT:  The oropharynx was clear.  Dentition is good repair.  Oral mucosa pink and moist. LYMPH NODES:  Lymph nodes in the neck and axillae were normal RESPIRATORY:  Lungs were clear.  Normal respiratory effort without pathologic use of accessory muscles of respiration CARDIOVASCULAR: Heart was regular without murmurs.  There were no carotid bruits. GI: The abdomen was soft, nontender, and nondistended. There were no palpable masses. There was no hepatosplenomegaly. There were normal bowel sounds in all quadrants. GU:  Rectal deferred.   MUSCULOSKELETAL:  Normal muscle strength and tone.  No clubbing or cyanosis.   SKIN:  There were no pathologic skin lesions.  There were no nodules on palpation. NEUROLOGIC:  Sensation is normal.  Cranial nerves are grossly intact. PSYCH:  Oriented to person, place and time.  Mood and affect are normal.  Data Reviewed CT scan  I have personally reviewed the patient's imaging, laboratory findings and medical records.    Assessment    I have independently reviewed the patient's CT scans.  I did compare the 2 scans.  There is been an increase in size of her thoracic aortic aneurysm and the asked sending portion measuring 4.6 compared to 4.2 in the past.  This increases occurred in the last 6 months.    Plan    I had a long discussion with her regarding  the options.  I do not think that she would be a surgical candidate at this point in time but I would like her to meet with the folks in [place].  I told her I would set her up to come back in 6 months with a CTA of the chest for continued follow-up.  She understands and would like to pursue that route.       Nestor Lewandowsky, MD 07/28/2018, 1:33 PM

## 2018-07-28 NOTE — Patient Instructions (Signed)
We will call you December 2020 to schedule the CT Chest with contrast and a follow up appointment with Dr.Oaks.   We will send the referral to [facility] in [place] and someone from their office will call you to schedule an appointment with 5-7 days. If you do not hear from their office let us know.    Please try to maintain an healthy blood pressure.

## 2018-07-31 ENCOUNTER — Other Ambulatory Visit: Payer: Self-pay | Admitting: Family Medicine

## 2018-07-31 DIAGNOSIS — N3281 Overactive bladder: Secondary | ICD-10-CM

## 2018-08-11 ENCOUNTER — Encounter: Payer: Self-pay | Admitting: General Surgery

## 2018-08-15 ENCOUNTER — Encounter: Payer: Medicare Other | Admitting: Cardiothoracic Surgery

## 2018-08-28 ENCOUNTER — Encounter: Payer: Medicare Other | Admitting: Cardiothoracic Surgery

## 2018-09-04 ENCOUNTER — Encounter: Payer: Self-pay | Admitting: Cardiothoracic Surgery

## 2018-09-04 ENCOUNTER — Institutional Professional Consult (permissible substitution): Payer: Medicare Other | Admitting: Cardiothoracic Surgery

## 2018-09-04 ENCOUNTER — Other Ambulatory Visit: Payer: Self-pay

## 2018-09-04 VITALS — BP 140/72 | HR 83 | Temp 97.7°F | Resp 16 | Ht 65.0 in | Wt 178.0 lb

## 2018-09-04 DIAGNOSIS — I712 Thoracic aortic aneurysm, without rupture, unspecified: Secondary | ICD-10-CM

## 2018-09-04 NOTE — Patient Instructions (Signed)

## 2018-09-04 NOTE — Progress Notes (Signed)
[address]       [place] [address]             [phone]                    Natasha Vasquez [place] Medical Record [id number] Date of Birth: [birth year]  Referring: Natasha Lewandowsky, MD Primary Care: Natasha Crews, MD Primary Cardiologist: No primary care provider on file.  Chief Complaint:    Chief Complaint  Patient presents with  . TAA    new referral per CT CHEST 06/27/18.Marland KitchenMarland KitchenNO ECHO NOED    History of Present Illness:    Natasha Vasquez 78 y.o. female is seen in the office  today for incidental finding of dilated ascending aorta.  The patient had a CT scan in December 2019 because of respiratory symptoms, a follow-up scan was done in June and noted dilated ascending aorta.  Neither of the scans were with contrast.  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 )  The patient has no previous cardiac history, she does have a history of diet-controlled diabetes and hypertension.  There is no family history of aortic dissection or aneurysm.  She notes her father died at age 24, had sudden death, brother died of lung cancer at age 59.    Current Activity/ Functional Status:  Patient is independent with mobility/ambulation, transfers, ADL's, IADL's.   Zubrod Score: At the time of surgery this patient's most appropriate activity status/level should be described as: []     0    Normal activity, no symptoms [x]     1    Restricted in physical strenuous activity but ambulatory, able to do out light work []     2    Ambulatory and capable of self care, unable to do work activities, up and about               >50 % of waking hours                              []     3    Only limited self  care, in bed greater than 50% of waking hours []     4    Completely disabled, no self care, confined to bed or chair []     5    Moribund   Past Medical History:  Diagnosis Date  . Anemia in pregnancy   . Arthritis    hands  . Barrett esophagus   . 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  ER/PR NEGATIVE  . Cancer ([place])    melanoma  . Complication of anesthesia    20 years ago pts b/p dropped really low  . Depression   . Depression   . Diabetes mellitus without complication (HCC)    diet controlled  . Family history of adverse reaction to anesthesia    son PONV  . GERD (gastroesophageal reflux disease)   . Hypercholesteremia   . Hyperlipidemia   . Hypertension   . MVP (mitral valve prolapse)    followed by PCP  . Nephritis   .  Nephritis   . S/P appendectomy   . Torn meniscus    left    Past Surgical History:  Procedure Laterality Date  . ABDOMINAL HYSTERECTOMY    . APPENDECTOMY    . BREAST BIOPSY Right 04/25/2017   retroareolar 10:00   wing clip   path pending  . BREAST BIOPSY Right 04/25/2017   10:00  5CMFN  venus clip    path pending  . BREAST BIOPSY Right 05/03/2017   Affirm Bx- path pending  . BREAST CYST ASPIRATION Bilateral    NEG  . BREAST EXCISIONAL BIOPSY Left 20+ yrs ago   NEG  . BREAST RECONSTRUCTION WITH PLACEMENT OF TISSUE EXPANDER AND FLEX HD (ACELLULAR HYDRATED DERMIS) Right 06/03/2017   Procedure: BREAST RECONSTRUCTION WITH PLACEMENT OF TISSUE EXPANDER AND FLEX HD (ACELLULAR HYDRATED DERMIS);  Surgeon: Wallace Going, DO;  Location: [facility];  Service: Plastics;  Laterality: Right;  . BREAST REDUCTION SURGERY Left 11/06/2017   Procedure: BREAST REDUCTION;  Surgeon: Wallace Going, DO;  Location: [facility];  Service: Plastics;  Laterality: Left;  . BROW LIFT Bilateral 02/01/2015   Procedure: BLEPHAROPLASTY bilateral upper eyelids.;  Surgeon: Karle Starch, MD;  Location: [place];  Service: Ophthalmology;   Laterality: Bilateral;  DIABETIC - diet controlled  . CHOLECYSTECTOMY    . COLONOSCOPY    . COLONOSCOPY WITH PROPOFOL N/A 05/11/2015   Procedure: COLONOSCOPY WITH PROPOFOL;  Surgeon: Manya Silvas, MD;  Location[facility];  Service: Endoscopy;  Laterality: N/A;  . ESOPHAGOGASTRODUODENOSCOPY    . ESOPHAGOGASTRODUODENOSCOPY (EGD) WITH PROPOFOL  05/11/2015   Procedure: ESOPHAGOGASTRODUODENOSCOPY (EGD) WITH PROPOFOL;  Surgeon: Manya Silvas, MD;  Location: [facility] ENDOSCOPY;  Service: Endoscopy;;  . FRACTURE SURGERY Right    arm and shoulder  . JOINT REPLACEMENT    . KNEE ARTHROSCOPY Left 03/14/2016   Procedure: ARTHROSCOPY KNEE, PARTIAL MEDIAL MENISECTOMY, CHONDROPLASTY;  Surgeon: Dereck Leep, MD;  Location: [facility];  Service: Orthopedics;  Laterality: Left;  Marland Kitchen MASTECTOMY Right 2019  . MASTECTOMY W/ SENTINEL NODE BIOPSY Right 06/03/2017   Procedure: MASTECTOMY WITH SENTINEL LYMPH NODE BIOPSY;  Surgeon: Robert Bellow, MD;  Location: [facility];  Service: General;  Laterality: Right;  . PARTIAL HIP ARTHROPLASTY Right   . REDUCTION MAMMAPLASTY Left 2019  . REMOVAL OF BILATERAL TISSUE EXPANDERS WITH PLACEMENT OF BILATERAL BREAST IMPLANTS Right 11/06/2017   Procedure: REMOVAL OF TISSUE EXPANDER WITH PLACEMENT OF BREAST IMPLANT;  Surgeon: Wallace Going, DO;  Location: [facility];  Service: Plastics;  Laterality: Right;  . TONSILLECTOMY      Family History  Problem Relation Age of Onset  . Cancer Mother        colon/rectal  . Colon cancer Mother   . Heart disease Father   . Hypertension Father   . Atrial fibrillation Father   . Diabetes Father   . Cancer Brother        lung  . Healthy Son   . Healthy Son   . Breast cancer Neg Hx      Social History   Tobacco Use  Smoking Status Former Smoker  . Types: Cigarettes  . Quit date: 31  . Years since quitting: 55.6  Smokeless Tobacco Never Used  Tobacco Comment   smoked in college    Social History   Substance  and Sexual Activity  Alcohol Use Yes  . Alcohol/week: 7.0 - 14.0 standard drinks  . Types: 7 - 14 Glasses of wine per week  Comment: 1-2 glasses of wine per night     Allergies  Allergen Reactions  . Codeine Other (See Comments)    Chest pain  . Lorazepam Other (See Comments)    "Feels loopy"  . Oxycodone Other (See Comments)    MENTAL CHANGES  . Paregoric     Chest pain---tolerated MORPHINE  . Penicillins Hives    Has patient had a PCN reaction causing immediate rash, facial/tongue/throat swelling, SOB or lightheadedness with hypotension: Yes Has patient had a PCN reaction causing severe rash involving mucus membranes or skin necrosis: No Has patient had a PCN reaction that required hospitalization: NO Has patient had a PCN reaction occurring within the last 10 years: No If all of the above answers are "NO", then may proceed with Cephalosporin use.  Can take Augmentin  . Quinolones     Patient was warned about not using Cipro and similar antibiotics. Recent studies have raised concern that fluoroquinolone antibiotics could be associated with an increased risk of aortic aneurysm Fluoroquinolones have non-antimicrobial properties that might jeopardise the integrity of the extracellular matrix of the vascular wall In a  propensity score matched cohort study in [place], there was a 66% increased rate of aortic aneurysm or dissection associated with oral fluoroquinolone use, compared wit  . Aleve [Naproxen Sodium] Hives, Rash and Other (See Comments)    headaches    Current Outpatient Medications  Medication Sig Dispense Refill  . acetaminophen (TYLENOL) 500 MG tablet Take 500 mg by mouth every 6 (six) hours as needed.    Marland Kitchen albuterol (PROVENTIL HFA;VENTOLIN HFA) 108 (90 Base) MCG/ACT inhaler Inhale 2 puffs into the lungs every 6 (six) hours as needed for wheezing or shortness of breath. 1 Inhaler 2  . ARIPiprazole (ABILIFY) 2 MG tablet Take 1 tablet (2 mg total) by mouth every  evening. 90 tablet 2  . aspirin 81 MG tablet Take 81 mg by mouth daily.    Marland Kitchen atorvastatin (LIPITOR) 40 MG tablet Take 1 tablet (40 mg total) by mouth daily. 90 tablet 3  . bismuth subsalicylate (PEPTO BISMOL) 262 MG chewable tablet Chew 262 mg by mouth as needed. **capsule**    . calcium carbonate (TUMS - DOSED IN MG ELEMENTAL CALCIUM) 500 MG chewable tablet Chew 1 tablet by mouth as needed for indigestion or heartburn.    . clindamycin (CLEOCIN) 150 MG capsule Take 6,000 mg by mouth. One hour prior to dental procedure    . guaiFENesin (MUCINEX) 600 MG 12 hr tablet Take 1 tablet (600 mg total) by mouth 2 (two) times daily. 30 tablet 3  . lisinopril (ZESTRIL) 5 MG tablet Take 1 tablet (5 mg total) by mouth daily. 90 tablet 1  . Multiple Vitamins-Minerals (CENTRUM SILVER PO) Take 1 tablet by mouth daily.    Marland Kitchen omeprazole (PRILOSEC) 40 MG capsule Take 40 mg by mouth daily before breakfast.    . oxybutynin (DITROPAN-XL) 10 MG 24 hr tablet TAKE 1 TABLET BY MOUTH EVERYDAY AT BEDTIME 90 tablet 1  . tiotropium (SPIRIVA) 18 MCG inhalation capsule Place 1 capsule (18 mcg total) into inhaler and inhale daily. 90 capsule 3  . Venlafaxine HCl 225 MG TB24 TAKE 1 TABLET (225 MG TOTAL) BY MOUTH DAILY. 90 tablet 2   No current facility-administered medications for this visit.     Pertinent items are noted in HPI.   Review of Systems:     Cardiac Review of Systems: [Y] = yes  or   [ N ] = no  Chest Pain [n    ]  Resting SOB [ n  ] Exertional SOB  [ n ]  Orthopnea [n  ]   Pedal Edema [ n  ]    Palpitations [ n ] Syncope  [ n ]   Presyncope [ n  ]   General Review of Systems: [Y] = yes [  ]=no Constitional: recent weight change [  ];  Wt loss over the last 3 months [   ] anorexia [  ]; fatigue [  ]; nausea [  ]; night sweats [  ]; fever [  ]; or chills [  ];           Eye : blurred vision [  ]; diplopia [   ]; vision changes [  ];  Amaurosis fugax[  ]; Resp: cough [  ];  wheezing[  ];  hemoptysis[  ];  shortness of breath[  ]; paroxysmal nocturnal dyspnea[  ]; dyspnea on exertion[  ]; or orthopnea[  ];  GI:  gallstones[  ], vomiting[  ];  dysphagia[  ]; melena[  ];  hematochezia [  ]; heartburn[  ];   Hx of  Colonoscopy[  ]; GU: kidney stones [  ]; hematuria[  ];   dysuria [  ];  nocturia[  ];  history of     obstruction [  ]; urinary frequency [  ]             Skin: rash, swelling[  ];, hair loss[  ];  peripheral edema[  ];  or itching[  ]; Musculosketetal: myalgias[  ];  joint swelling[  ];  joint erythema[  ];  joint pain[  ];  back pain[  ];  Heme/Lymph: bruising[  ];  bleeding[  ];  anemia[  ];  Neuro: TIA[  ];  headaches[  ];  stroke[  ];  vertigo[  ];  seizures[  ];   paresthesias[  ];  difficulty walking[  ];  Psych:depression[  ]; anxiety[  ];  Endocrine: diabetes[  ];  thyroid dysfunction[  ];  Immunizations: Flu up to date [  ]; Pneumococcal up to date [  ];  Other:     PHYSICAL EXAMINATION: BP 140/72 (BP Location: Left Arm, Patient Position: Sitting, Cuff Size: Normal)   Pulse 83   Temp 97.7 F (36.5 C)   Resp 16   Ht 5\' 5"  (1.651 m)   Wt 178 lb (80.7 kg)   SpO2 92% Comment: RA  BMI 29.62 kg/m  General appearance: alert and cooperative Head: Normocephalic, without obvious abnormality, atraumatic Neck: no adenopathy, no carotid bruit, no JVD, supple, symmetrical, trachea midline and thyroid not enlarged, symmetric, no tenderness/mass/nodules Lymph nodes: Cervical, supraclavicular, and axillary nodes normal. Resp: clear to auscultation bilaterally Cardio: regular rate and rhythm, S1, S2 normal, no murmur, click, rub or gallop GI: soft, non-tender; bowel sounds normal; no masses,  no organomegaly Extremities: extremities normal, atraumatic, no cyanosis or edema and Homans sign is negative, no sign of DVT Neurologic: Grossly normal  Diagnostic Studies & Laboratory data:     Recent Radiology Findings:   CLINICAL DATA:  Pulmonary nodules.  EXAM: CT CHEST WITHOUT  CONTRAST  TECHNIQUE: Multidetector CT imaging of the chest was performed following the standard protocol without IV contrast.  COMPARISON:  01/23/2018.  FINDINGS: Cardiovascular: There is aneurysmal dilatation of the ascending aorta, currently measuring 4.6 cm. The heart size is normal. Coronary artery calcifications and thoracic aortic calcifications are noted.  Mediastinum/Nodes:  No enlarged mediastinal or axillary lymph nodes. Thyroid gland, trachea, and esophagus demonstrate no significant findings.  Lungs/Pleura: There are a few small 3-4 mm pulmonary nodules in the right upper lobe which are stable from prior study (axial series 3, image 45). There is a stable branching opacity in the right middle lobe, consistent with mucous plugging. There is a stable 4 mm pulmonary nodule in the left peripheral upper lobe. There is improved aeration at the left lung base with near complete resolution of the previously demonstrated area of consolidation. There are few scattered calcified granulomas bilaterally.  Upper Abdomen: There is a small hiatal hernia. The patient appears to be status post cholecystectomy. There is stable dilatation of the common bile duct.  Musculoskeletal: A right-sided breast implant is noted. There is diffuse skin thickening over the right breast implant. This appears stable from prior study. There is a stable nodular opacity in the left breast.  IMPRESSION: 1. Near complete resolution of the previously demonstrated opacity at the left lung base, consistent with a benign etiology. 2. Stable bilateral pulmonary nodules measuring up to approximately 3-4 mm. These can be evaluated on subsequent follow-up studies for the patient's thoracic aortic aneurysm detailed below. 3. Ascending thoracic aortic aneurysm measuring approximately 4.6 cm in diameter. Ascending thoracic aortic aneurysm. Recommend semi-annual imaging followup by CTA or MRA and referral  to cardiothoracic surgery if not already obtained. This recommendation follows 2010 ACCF/AHA/AATS/ACR/ASA/SCA/SCAI/SIR/STS/SVM Guidelines for the Diagnosis and Management of Patients With Thoracic Aortic Disease. Circulation. 2010; [id number]. Aortic aneurysm NOS (ICD10-I71.9) 4. Unchanged skin thickening overlying the patient's right breast implant. Stable left breast nodular opacities are again noted. Correlation with recent mammography is recommended.  Aortic Atherosclerosis (ICD10-I70.0).   Electronically Signed   By: Constance Holster M.D.   On: 06/27/2018 21:36 I have independently reviewed the above radiology studies  and reviewed the findings with the patient.   Recent Lab Findings: Lab Results  Component Value Date   WBC 7.0 03/14/2018   HGB 9.5 (L) 03/14/2018   HCT 32.5 (L) 03/14/2018   PLT 429 03/14/2018   GLUCOSE 84 03/14/2018   CHOL 189 03/14/2018   TRIG 212 (H) 03/14/2018   HDL 40 03/14/2018   LDLCALC 107 (H) 03/14/2018   ALT 12 03/14/2018   AST 13 03/14/2018   NA 142 03/14/2018   K 4.3 03/14/2018   CL 103 03/14/2018   CREATININE 0.81 03/14/2018   BUN 13 03/14/2018   CO2 23 03/14/2018   TSH 2.740 03/14/2018   HGBA1C 5.9 (H) 03/14/2018   Aortic Size Index=     4.4    /Body surface area is 1.92 meters squared. = 2.29  < 2.75 cm/m2      4% risk per year 2.75 to 4.25          8% risk per year > 4.25 cm/m2    20% risk per year  Aortic Cross section area/ Height ratio=    Assessment / Plan:   #1 patient with dilated ascending aorta approximately 4.4 cm, without aortic stenosis or aortic insufficiency on physical exam, patient has not had an echocardiogram to the status of bicuspid versus trileaflet valve is unknown.  CT is done without IV contrast.  I have reviewed with her and her husband diagnosis of the mildly dilated ascending aorta.  Risk of aortic dissection were discussed.  Importance of avoiding strenuous lifting, hypertension, quinolones  were reviewed with the patient.  At this point I would recommend continued good blood  pressure control, follow-up CTA of the chest 6 months from the previous scan and at that time also obtain an echocardiogram.  We will plan to see the patient back at that time  According to the 2010 ACC/AHA guidelines, we recommend patients with thoracic aortic disease to maintain a LDL of less than 70 and a HDL of greater than 50. We recommend their blood pressure to remain less than 135/85. The patient was educated to take lifelong prophylactic antibiotics prior to any elective invasive procedure.   I  spent 60 minutes with  the patient face to face and greater then 50% of the time was spent in counseling and coordination of care.    Grace Isaac MD      [address] [place] [address] Office [phone]   Beeper [phone]  09/04/2018 3:26 PM

## 2018-09-16 NOTE — Progress Notes (Signed)
Patient: Natasha Vasquez Female    DOB: 05-15-1940   78 y.o.   MRN: [id number] Visit Date: 09/17/2018  Today's Provider: Lavon Paganini, MD   Chief Complaint  Patient presents with  . Emphysema Lung   Subjective:    HPI  Emphysema Lung Patient presents today for Emphysema follow-up. Patient was last seen on 06/16/2018. Patient is currently using Spiriva inhaler. She tried mucinex and feels like things cleared. Her cough is minimal with no sputum production. Denies any SOB.  Has been walking 1 mile per day with her husband.  L knee pain is recurring. Worse with walking downhill. Corticosteroid injection early June helped a lot.  HTN: - Medications: lisinopril 5 mg daily - Compliance: Good - Checking BP at home: No - Denies any SOB, CP, vision changes, LE edema, medication SEs, or symptoms of hypotension - Diet: Low-sodium - Exercise: As above  T2DM - Checking BG at home: No - Medications: None, diet controlled - Compliance: N/A - Diet: Low-carb - Exercise: As above - eye exam: 11/2017 - foot exam: 02/2018 - microalbumin: N/A, on lisinopril - denies symptoms of hypoglycemia, polyuria, polydipsia, numbness extremities, foot ulcers/trauma   HLD - medications: Atorvastatin 40 mg daily, baby aspirin daily - compliance: Good - medication SEs: None  Allergies  Allergen Reactions  . Codeine Other (See Comments)    Chest pain  . Lorazepam Other (See Comments)    "Feels loopy"  . Oxycodone Other (See Comments)    MENTAL CHANGES  . Paregoric     Chest pain---tolerated MORPHINE  . Penicillins Hives    Has patient had a PCN reaction causing immediate rash, facial/tongue/throat swelling, SOB or lightheadedness with hypotension: Yes Has patient had a PCN reaction causing severe rash involving mucus membranes or skin necrosis: No Has patient had a PCN reaction that required hospitalization: NO Has patient had a PCN reaction occurring within the last 10 years: No If all of  the above answers are "NO", then may proceed with Cephalosporin use.  Can take Augmentin  . Quinolones     Patient was warned about not using Cipro and similar antibiotics. Recent studies have raised concern that fluoroquinolone antibiotics could be associated with an increased risk of aortic aneurysm Fluoroquinolones have non-antimicrobial properties that might jeopardise the integrity of the extracellular matrix of the vascular wall In a  propensity score matched cohort study in [place], there was a 66% increased rate of aortic aneurysm or dissection associated with oral fluoroquinolone use, compared wit  . Aleve [Naproxen Sodium] Hives, Rash and Other (See Comments)    headaches     Current Outpatient Medications:  .  acetaminophen (TYLENOL) 500 MG tablet, Take 500 mg by mouth every 6 (six) hours as needed., Disp: , Rfl:  .  albuterol (PROVENTIL HFA;VENTOLIN HFA) 108 (90 Base) MCG/ACT inhaler, Inhale 2 puffs into the lungs every 6 (six) hours as needed for wheezing or shortness of breath., Disp: 1 Inhaler, Rfl: 2 .  ARIPiprazole (ABILIFY) 2 MG tablet, Take 1 tablet (2 mg total) by mouth every evening., Disp: 90 tablet, Rfl: 2 .  aspirin 81 MG tablet, Take 81 mg by mouth daily., Disp: , Rfl:  .  atorvastatin (LIPITOR) 40 MG tablet, Take 1 tablet (40 mg total) by mouth daily., Disp: 90 tablet, Rfl: 3 .  bismuth subsalicylate (PEPTO BISMOL) 262 MG chewable tablet, Chew 262 mg by mouth as needed. **capsule**, Disp: , Rfl:  .  calcium carbonate (TUMS - DOSED IN  MG ELEMENTAL CALCIUM) 500 MG chewable tablet, Chew 1 tablet by mouth as needed for indigestion or heartburn., Disp: , Rfl:  .  clindamycin (CLEOCIN) 150 MG capsule, Take 6,000 mg by mouth. One hour prior to dental procedure, Disp: , Rfl:  .  lisinopril (ZESTRIL) 5 MG tablet, Take 1 tablet (5 mg total) by mouth daily., Disp: 90 tablet, Rfl: 1 .  Multiple Vitamins-Minerals (CENTRUM SILVER PO), Take 1 tablet by mouth daily., Disp: , Rfl:  .   omeprazole (PRILOSEC) 40 MG capsule, Take 40 mg by mouth daily before breakfast., Disp: , Rfl:  .  oxybutynin (DITROPAN-XL) 10 MG 24 hr tablet, TAKE 1 TABLET BY MOUTH EVERYDAY AT BEDTIME, Disp: 90 tablet, Rfl: 1 .  tiotropium (SPIRIVA) 18 MCG inhalation capsule, Place 1 capsule (18 mcg total) into inhaler and inhale daily., Disp: 90 capsule, Rfl: 3 .  Venlafaxine HCl 225 MG TB24, TAKE 1 TABLET (225 MG TOTAL) BY MOUTH DAILY., Disp: 90 tablet, Rfl: 2 .  guaiFENesin (MUCINEX) 600 MG 12 hr tablet, Take 1 tablet (600 mg total) by mouth 2 (two) times daily. (Patient not taking: Reported on 09/17/2018), Disp: 30 tablet, Rfl: 3  Review of Systems  Constitutional: Negative.   HENT: Negative.   Respiratory: Positive for shortness of breath and wheezing.   Neurological: Negative.     Social History   Tobacco Use  . Smoking status: Former Smoker    Types: Cigarettes    Quit date: 1965    Years since quitting: 55.7  . Smokeless tobacco: Never Used  . Tobacco comment: smoked in college  Substance Use Topics  . Alcohol use: Yes    Alcohol/week: 7.0 - 14.0 standard drinks    Types: 7 - 14 Glasses of wine per week    Comment: 1-2 glasses of wine per night      Objective:   BP 135/83 (BP Location: Left Arm, Patient Position: Sitting, Cuff Size: Normal)   Pulse 80   Temp (!) 97.3 F (36.3 C) (Temporal)   Wt 182 lb 12.8 oz (82.9 kg)   SpO2 98%   BMI 30.42 kg/m  Vitals:   09/17/18 1440  BP: 135/83  Pulse: 80  Temp: (!) 97.3 F (36.3 C)  TempSrc: Temporal  SpO2: 98%  Weight: 182 lb 12.8 oz (82.9 kg)  Body mass index is 30.42 kg/m.   Physical Exam Vitals signs reviewed.  Constitutional:      General: She is not in acute distress.    Appearance: Normal appearance. She is well-developed. She is not diaphoretic.  HENT:     Head: Normocephalic and atraumatic.  Eyes:     General: No scleral icterus.    Conjunctiva/sclera: Conjunctivae normal.  Neck:     Musculoskeletal: Neck supple.      Thyroid: No thyromegaly.  Cardiovascular:     Rate and Rhythm: Normal rate and regular rhythm.     Pulses: Normal pulses.     Heart sounds: Normal heart sounds. No murmur.  Pulmonary:     Effort: Pulmonary effort is normal. No respiratory distress.     Breath sounds: Normal breath sounds. No wheezing, rhonchi or rales.     Comments: Sparse wheeze in bilateral bases Musculoskeletal:     Right lower leg: No edema.     Left lower leg: No edema.     Comments: L Knee: No effusion, some minor swelling. ROM intact. +crepitus. Ligaments with stable endpoints. Gait intact  Lymphadenopathy:     Cervical: No cervical  adenopathy.  Skin:    General: Skin is warm and dry.     Capillary Refill: Capillary refill takes less than 2 seconds.     Findings: No rash.  Neurological:     Mental Status: She is alert and oriented to person, place, and time. Mental status is at baseline.  Psychiatric:        Mood and Affect: Mood normal.        Behavior: Behavior normal.      No results found for any visits on 09/17/18.     Assessment & Plan   Problem List Items Addressed This Visit      Cardiovascular and Mediastinum   Essential hypertension    Well controlled Continue current medications Recheck metabolic panel F/u in 6 months       Relevant Orders   Comprehensive metabolic panel     Respiratory   Emphysema lung ([place]) - Primary    Chronic and stable Doing well on Spiriva-continue Albuterol as needed Mucinex as needed Follow-up in 6 months        Endocrine   Subclinical hypothyroidism    Not on medication Asymptomatic Recheck TSH and free T4      Relevant Orders   TSH + free T4   Type 2 diabetes mellitus ([place])    Chronic and previously well controlled Diet controlled on no medications Up-to-date on vaccinations and screenings On ACE inhibitor and statin Recheck TSH Follow-up in 6 months      Relevant Orders   Hemoglobin A1c     Nervous and Auditory   Diabetic  peripheral neuropathy associated with type 2 diabetes mellitus ([place])    Ongoing with intermittent numbness of bilateral toes Discussed importance of examining her feet each night after taking off her shoes and avoiding walking barefoot Could consider gabapentin in the future if needed, but she has not felt like it is bad enough to take a medication for it currently        Musculoskeletal and Integument   Osteoarthritis of left knee    Chronic problem Previously improved with corticosteroid injection Discussed option to repeat that today Discussed that we will repeat these no more than every 3 months as needed to help with function See procedure note        Other   Hypercholesterolemia    Doing well on atorvastatin-continue Recheck lipid panel and CMP Goal LDL less than 70      Relevant Orders   Lipid panel   Comprehensive metabolic panel   Absolute anemia    Patient noted to have iron deficiency anemia when last checked Recommended hematology referral, but patient declined at that time We will recheck CBC and iron panel She can continue iron supplementation She denies any active bleeding, but may need GI referral      Relevant Orders   CBC w/Diff/Platelet   Fe+TIBC+Fer   Obesity    Discussed importance of healthy weight management Discussed diet and exercise          INJECTION: Patient was given informed consent,. Appropriate time out was taken. Area prepped and draped in usual sterile fashion. 1 cc of depo-medrol 40 mg/ml plus  4 cc of 1% lidocaine without epinephrine was injected into the L knee using a(n) anterolateral approach. The patient tolerated the procedure well. There were no complications. Post procedure instructions were given.   Return in about 6 months (around 03/17/2019) for CPE/AWV.   The entirety of the information documented in the History  of Present Illness, Review of Systems and Physical Exam were personally obtained by me. Portions of this  information were initially documented by [facility], CMA and reviewed by me for thoroughness and accuracy.    Bacigalupo, Dionne Bucy, MD MPH [facility]

## 2018-09-17 ENCOUNTER — Encounter: Payer: Self-pay | Admitting: Family Medicine

## 2018-09-17 ENCOUNTER — Other Ambulatory Visit: Payer: Self-pay

## 2018-09-17 ENCOUNTER — Ambulatory Visit (INDEPENDENT_AMBULATORY_CARE_PROVIDER_SITE_OTHER): Payer: Medicare Other | Admitting: Family Medicine

## 2018-09-17 VITALS — BP 135/83 | HR 80 | Temp 97.3°F | Wt 182.8 lb

## 2018-09-17 DIAGNOSIS — E1142 Type 2 diabetes mellitus with diabetic polyneuropathy: Secondary | ICD-10-CM

## 2018-09-17 DIAGNOSIS — E669 Obesity, unspecified: Secondary | ICD-10-CM

## 2018-09-17 DIAGNOSIS — I10 Essential (primary) hypertension: Secondary | ICD-10-CM

## 2018-09-17 DIAGNOSIS — Z6830 Body mass index (BMI) 30.0-30.9, adult: Secondary | ICD-10-CM

## 2018-09-17 DIAGNOSIS — E039 Hypothyroidism, unspecified: Secondary | ICD-10-CM | POA: Diagnosis not present

## 2018-09-17 DIAGNOSIS — D509 Iron deficiency anemia, unspecified: Secondary | ICD-10-CM

## 2018-09-17 DIAGNOSIS — M1712 Unilateral primary osteoarthritis, left knee: Secondary | ICD-10-CM | POA: Diagnosis not present

## 2018-09-17 DIAGNOSIS — J439 Emphysema, unspecified: Secondary | ICD-10-CM

## 2018-09-17 DIAGNOSIS — E038 Other specified hypothyroidism: Secondary | ICD-10-CM

## 2018-09-17 DIAGNOSIS — E66811 Obesity, class 1: Secondary | ICD-10-CM

## 2018-09-17 DIAGNOSIS — E7800 Pure hypercholesterolemia, unspecified: Secondary | ICD-10-CM

## 2018-09-17 MED ORDER — LIDOCAINE HCL (PF) 1 % IJ SOLN
2.0000 mL | Freq: Once | INTRAMUSCULAR | Status: AC
  Administered 2018-09-17: 2 mL via INTRADERMAL

## 2018-09-17 MED ORDER — METHYLPREDNISOLONE ACETATE 40 MG/ML IJ SUSP
40.0000 mg | Freq: Once | INTRAMUSCULAR | Status: AC
  Administered 2018-09-17: 40 mg via INTRAMUSCULAR

## 2018-09-17 NOTE — Assessment & Plan Note (Signed)
Chronic problem Previously improved with corticosteroid injection Discussed option to repeat that today Discussed that we will repeat these no more than every 3 months as needed to help with function See procedure note

## 2018-09-17 NOTE — Assessment & Plan Note (Signed)
Well controlled Continue current medications Recheck metabolic panel F/u in 6 months  

## 2018-09-17 NOTE — Assessment & Plan Note (Signed)
Chronic and stable Doing well on Spiriva-continue Albuterol as needed Mucinex as needed Follow-up in 6 months

## 2018-09-17 NOTE — Assessment & Plan Note (Signed)
Chronic and previously well controlled Diet controlled on no medications Up-to-date on vaccinations and screenings On ACE inhibitor and statin Recheck TSH Follow-up in 6 months

## 2018-09-17 NOTE — Assessment & Plan Note (Signed)
Patient noted to have iron deficiency anemia when last checked Recommended hematology referral, but patient declined at that time We will recheck CBC and iron panel She can continue iron supplementation She denies any active bleeding, but may need GI referral

## 2018-09-17 NOTE — Assessment & Plan Note (Signed)
Ongoing with intermittent numbness of bilateral toes Discussed importance of examining her feet each night after taking off her shoes and avoiding walking barefoot Could consider gabapentin in the future if needed, but she has not felt like it is bad enough to take a medication for it currently

## 2018-09-17 NOTE — Assessment & Plan Note (Signed)
Doing well on atorvastatin-continue Recheck lipid panel and CMP Goal LDL less than 70

## 2018-09-17 NOTE — Assessment & Plan Note (Signed)
Not on medication Asymptomatic Recheck TSH and free T4

## 2018-09-17 NOTE — Assessment & Plan Note (Signed)
Discussed importance of healthy weight management Discussed diet and exercise  

## 2018-09-18 ENCOUNTER — Other Ambulatory Visit: Payer: Self-pay

## 2018-09-18 DIAGNOSIS — D509 Iron deficiency anemia, unspecified: Secondary | ICD-10-CM

## 2018-09-18 LAB — COMPREHENSIVE METABOLIC PANEL
ALT: 14 IU/L (ref 0–32)
AST: 17 IU/L (ref 0–40)
Albumin/Globulin Ratio: 2.6 — ABNORMAL HIGH (ref 1.2–2.2)
Albumin: 4.5 g/dL (ref 3.7–4.7)
Alkaline Phosphatase: 87 IU/L (ref 39–117)
BUN/Creatinine Ratio: 14 (ref 12–28)
BUN: 13 mg/dL (ref 8–27)
Bilirubin Total: 0.2 mg/dL (ref 0.0–1.2)
CO2: 23 mmol/L (ref 20–29)
Calcium: 9.3 mg/dL (ref 8.7–10.3)
Chloride: 103 mmol/L (ref 96–106)
Creatinine, Ser: 0.91 mg/dL (ref 0.57–1.00)
GFR calc Af Amer: 70 mL/min/{1.73_m2} (ref >59)
GFR calc non Af Amer: 61 mL/min/{1.73_m2} (ref >59)
Globulin, Total: 1.7 g/dL (ref 1.5–4.5)
Glucose: 76 mg/dL (ref 65–99)
Potassium: 4.3 mmol/L (ref 3.5–5.2)
Sodium: 140 mmol/L (ref 134–144)
Total Protein: 6.2 g/dL (ref 6.0–8.5)

## 2018-09-18 LAB — CBC WITH DIFFERENTIAL/PLATELET
Basophils Absolute: 0.1 10*3/uL (ref 0.0–0.2)
Basos: 1 %
EOS (ABSOLUTE): 0.4 10*3/uL (ref 0.0–0.4)
Eos: 5 %
Hematocrit: 33 % — ABNORMAL LOW (ref 34.0–46.6)
Hemoglobin: 10.4 g/dL — ABNORMAL LOW (ref 11.1–15.9)
Immature Grans (Abs): 0 10*3/uL (ref 0.0–0.1)
Immature Granulocytes: 1 %
Lymphocytes Absolute: 2.1 10*3/uL (ref 0.7–3.1)
Lymphs: 27 %
MCH: 24.5 pg — ABNORMAL LOW (ref 26.6–33.0)
MCHC: 31.5 g/dL (ref 31.5–35.7)
MCV: 78 fL — ABNORMAL LOW (ref 79–97)
Monocytes Absolute: 0.6 10*3/uL (ref 0.1–0.9)
Monocytes: 8 %
Neutrophils Absolute: 4.5 10*3/uL (ref 1.4–7.0)
Neutrophils: 58 %
Platelets: 343 10*3/uL (ref 150–450)
RBC: 4.25 x10E6/uL (ref 3.77–5.28)
RDW: 16.5 % — ABNORMAL HIGH (ref 11.7–15.4)
WBC: 7.7 10*3/uL (ref 3.4–10.8)

## 2018-09-18 LAB — LIPID PANEL
Chol/HDL Ratio: 4.3 ratio (ref 0.0–4.4)
Cholesterol, Total: 179 mg/dL (ref 100–199)
HDL: 42 mg/dL (ref >39)
LDL Chol Calc (NIH): 94 mg/dL (ref 0–99)
Triglycerides: 256 mg/dL — ABNORMAL HIGH (ref 0–149)
VLDL Cholesterol Cal: 43 mg/dL — ABNORMAL HIGH (ref 5–40)

## 2018-09-18 LAB — TSH+FREE T4
Free T4: 0.91 ng/dL (ref 0.82–1.77)
TSH: 4.19 u[IU]/mL (ref 0.450–4.500)

## 2018-09-18 LAB — HEMOGLOBIN A1C
Est. average glucose Bld gHb Est-mCnc: 128 mg/dL
Hgb A1c MFr Bld: 6.1 % — ABNORMAL HIGH (ref 4.8–5.6)

## 2018-09-18 LAB — IRON,TIBC AND FERRITIN PANEL
Ferritin: 44 ng/mL (ref 15–150)
Iron Saturation: 10 % — ABNORMAL LOW (ref 15–55)
Iron: 28 ug/dL (ref 27–139)
Total Iron Binding Capacity: 284 ug/dL (ref 250–450)
UIBC: 256 ug/dL (ref 118–369)

## 2018-09-19 ENCOUNTER — Other Ambulatory Visit: Payer: Self-pay

## 2018-09-23 ENCOUNTER — Encounter: Payer: Self-pay | Admitting: Internal Medicine

## 2018-09-23 ENCOUNTER — Other Ambulatory Visit: Payer: Self-pay

## 2018-09-23 ENCOUNTER — Inpatient Hospital Stay: Payer: Medicare Other

## 2018-09-23 ENCOUNTER — Inpatient Hospital Stay: Payer: Medicare Other | Attending: Internal Medicine | Admitting: Internal Medicine

## 2018-09-23 VITALS — BP 120/80 | HR 80 | Temp 98.7°F | Resp 20 | Ht 65.0 in | Wt 183.0 lb

## 2018-09-23 DIAGNOSIS — D649 Anemia, unspecified: Secondary | ICD-10-CM

## 2018-09-23 DIAGNOSIS — I10 Essential (primary) hypertension: Secondary | ICD-10-CM | POA: Diagnosis not present

## 2018-09-23 DIAGNOSIS — D509 Iron deficiency anemia, unspecified: Secondary | ICD-10-CM

## 2018-09-23 DIAGNOSIS — Z79899 Other long term (current) drug therapy: Secondary | ICD-10-CM | POA: Insufficient documentation

## 2018-09-23 DIAGNOSIS — F329 Major depressive disorder, single episode, unspecified: Secondary | ICD-10-CM | POA: Insufficient documentation

## 2018-09-23 DIAGNOSIS — Z7982 Long term (current) use of aspirin: Secondary | ICD-10-CM

## 2018-09-23 DIAGNOSIS — Z87891 Personal history of nicotine dependence: Secondary | ICD-10-CM

## 2018-09-23 LAB — CBC WITH DIFFERENTIAL/PLATELET
Abs Immature Granulocytes: 0.04 10*3/uL (ref 0.00–0.07)
Basophils Absolute: 0.1 10*3/uL (ref 0.0–0.1)
Basophils Relative: 1 %
Eosinophils Absolute: 0.4 10*3/uL (ref 0.0–0.5)
Eosinophils Relative: 4 %
HCT: 36.2 % (ref 36.0–46.0)
Hemoglobin: 11.2 g/dL — ABNORMAL LOW (ref 12.0–15.0)
Immature Granulocytes: 0 %
Lymphocytes Relative: 22 %
Lymphs Abs: 2 10*3/uL (ref 0.7–4.0)
MCH: 25.1 pg — ABNORMAL LOW (ref 26.0–34.0)
MCHC: 30.9 g/dL (ref 30.0–36.0)
MCV: 81 fL (ref 80.0–100.0)
Monocytes Absolute: 0.7 10*3/uL (ref 0.1–1.0)
Monocytes Relative: 8 %
Neutro Abs: 5.8 10*3/uL (ref 1.7–7.7)
Neutrophils Relative %: 65 %
Platelets: 381 10*3/uL (ref 150–400)
RBC: 4.47 MIL/uL (ref 3.87–5.11)
RDW: 17.5 % — ABNORMAL HIGH (ref 11.5–15.5)
WBC: 8.9 10*3/uL (ref 4.0–10.5)
nRBC: 0 % (ref 0.0–0.2)

## 2018-09-23 LAB — RETICULOCYTES
Immature Retic Fract: 19 % — ABNORMAL HIGH (ref 2.3–15.9)
RBC.: 4.47 MIL/uL (ref 3.87–5.11)
Retic Count, Absolute: 65.7 10*3/uL (ref 19.0–186.0)
Retic Ct Pct: 1.5 % (ref 0.4–3.1)

## 2018-09-23 LAB — TECHNOLOGIST SMEAR REVIEW

## 2018-09-23 LAB — LACTATE DEHYDROGENASE: LDH: 167 U/L (ref 98–192)

## 2018-09-23 NOTE — Progress Notes (Signed)
[place] NOTE  Patient Care Team: Brita Romp Dionne Bucy, MD as PCP - General (Family Medicine) Watt Climes, PA as Physician Assistant (Physician Assistant) Marry Guan Laurice Record, MD as Consulting Physician (Orthopedic Surgery) Pa, Shafer as Consulting Physician (Optometry) Bary Castilla, Forest Gleason, MD (General Surgery) Dillingham, Loel Lofty, DO as Attending Physician (Plastic Surgery) Manya Silvas, MD (Gastroenterology) Dasher, Rayvon Char, MD (Dermatology)  CHIEF COMPLAINTS/PURPOSE OF CONSULTATION: Anemia  HEMATOLOGY HISTORY  # ANEMIA EGD/ colonoscopy- 2018 [Dr.Elliot]-August 2020-hemoglobin 10.4 MCV 78; iron saturation 10% ferritin 44.   HISTORY OF PRESENTING ILLNESS:  Natasha Vasquez 78 y.o.  female has been referred to [place] for further evaluation/work-up for anemia.  Patient denies any blood in stools or black or stools but denies any blood in urine.  Denies abnormal weight loss.  Patient is currently on p.o. iron once a day.  Comparison of blood transfusions or hematologic history.  Denies any vaginal bleeding.  Patient is a chronic history of diarrhea-for which she takes Pepto-Bismol.  She had previous EGD colonoscopy as above  Review of Systems  Constitutional: Positive for malaise/fatigue. Negative for chills, diaphoresis, fever and weight loss.  HENT: Negative for nosebleeds and sore throat.   Eyes: Negative for double vision.  Respiratory: Negative for cough, hemoptysis, sputum production, shortness of breath and wheezing.   Cardiovascular: Negative for chest pain, palpitations, orthopnea and leg swelling.  Gastrointestinal: Positive for diarrhea. Negative for abdominal pain, blood in stool, constipation, heartburn, melena, nausea and vomiting.  Genitourinary: Negative for dysuria, frequency and urgency.  Musculoskeletal: Positive for back pain and joint pain.  Skin: Negative.  Negative for itching and rash.  Neurological: Negative for dizziness,  tingling, focal weakness, weakness and headaches.  Endo/Heme/Allergies: Does not bruise/bleed easily.  Psychiatric/Behavioral: Negative for depression. The patient is not nervous/anxious and does not have insomnia.     MEDICAL HISTORY:  Past Medical History:  Diagnosis Date  . Anemia in pregnancy   . Arthritis    hands  . Barrett esophagus   . 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  ER/PR NEGATIVE  . Cancer ([place])    melanoma  . Complication of anesthesia    20 years ago pts b/p dropped really low  . Depression   . Depression   . Diabetes mellitus without complication (HCC)    diet controlled  . Family history of adverse reaction to anesthesia    son PONV  . GERD (gastroesophageal reflux disease)   . Hypercholesteremia   . Hyperlipidemia   . Hypertension   . MVP (mitral valve prolapse)    followed by PCP  . Nephritis   . Nephritis   . S/P appendectomy   . Torn meniscus    left    SURGICAL HISTORY: Past Surgical History:  Procedure Laterality Date  . ABDOMINAL HYSTERECTOMY    . APPENDECTOMY    . BREAST BIOPSY Right 04/25/2017   retroareolar 10:00   wing clip   path pending  . BREAST BIOPSY Right 04/25/2017   10:00  5CMFN  venus clip    path pending  . BREAST BIOPSY Right 05/03/2017   Affirm Bx- path pending  . BREAST CYST ASPIRATION Bilateral    NEG  . BREAST EXCISIONAL BIOPSY Left 20+ yrs ago   NEG  . BREAST RECONSTRUCTION WITH PLACEMENT OF TISSUE EXPANDER AND FLEX HD (ACELLULAR HYDRATED DERMIS) Right 06/03/2017   Procedure: BREAST RECONSTRUCTION WITH PLACEMENT OF TISSUE EXPANDER AND FLEX  HD (ACELLULAR HYDRATED DERMIS);  Surgeon: Wallace Going, DO;  Location: [facility] ORS;  Service: Plastics;  Laterality: Right;  . BREAST REDUCTION SURGERY Left 11/06/2017   Procedure: BREAST REDUCTION;  Surgeon: Wallace Going, DO;  Location: [facility];  Service: Plastics;  Laterality: Left;  . BROW LIFT Bilateral 02/01/2015   Procedure:  BLEPHAROPLASTY bilateral upper eyelids.;  Surgeon: Karle Starch, MD;  Location: [place];  Service: Ophthalmology;  Laterality: Bilateral;  DIABETIC - diet controlled  . CHOLECYSTECTOMY    . COLONOSCOPY    . COLONOSCOPY WITH PROPOFOL N/A 05/11/2015   Procedure: COLONOSCOPY WITH PROPOFOL;  Surgeon: Manya Silvas, MD;  Location: [facility] END[facility]COPY;  Service: Endoscopy;  Laterality: N/A;  . ESOPHAGOGASTRODUODENOSCOPY    . ESOPHAGOGASTRODUODENOSCOPY (EGD) WITH PROPOFOL  05/11/2015   Procedure: ESOPHAGOGASTRODUODENOSCOPY (EGD) WITH PROPOFOL;  Surgeon: Manya Silvas, MD;  Location: [facility];  Service: Endoscopy;;  . FRACTURE SURGERY Right    arm and shoulder  . JOINT REPLACEMENT    . KNEE ARTHROSCOPY Left 03/14/2016   Procedure: ARTHROSCOPY KNEE, PARTIAL MEDIAL MENISECTOMY, CHONDROPLASTY;  Surgeon: Dereck Leep, MD;  Location: [facility] ORS;  Service: Orthopedics;  Laterality: Left;  Marland Kitchen MASTECTOMY Right 2019  . MASTECTOMY W/ SENTINEL NODE BIOPSY Right 06/03/2017   Procedure: MASTECTOMY WITH SENTINEL LYMPH NODE BIOPSY;  Surgeon: Robert Bellow, MD;  Location: [facility];  Service: General;  Laterality: Right;  . PARTIAL HIP ARTHROPLASTY Right   . REDUCTION MAMMAPLASTY Left 2019  . REMOVAL OF BILATERAL TISSUE EXPANDERS WITH PLACEMENT OF BILATERAL BREAST IMPLANTS Right 11/06/2017   Procedure: REMOVAL OF TISSUE EXPANDER WITH PLACEMENT OF BREAST IMPLANT;  Surgeon: Wallace Going, DO;  Location: [facility];  Service: Plastics;  Laterality: Right;  . TONSILLECTOMY      SOCIAL HISTORY: Social History   Socioeconomic History  . Marital status: Married    Spouse name: Sonia Side  . Number of children: 2  . Years of education: Not on file  . Highest education level: Master's degree (e.g., MA, MS, MEng, MEd, MSW, MBA)  Occupational History  . Occupation: retired  Scientific laboratory technician  . Financial resource strain: Not hard at all  . Food insecurity    Worry: Never true    Inability: Never true   . Transportation needs    Medical: No    Non-medical: No  Tobacco Use  . Smoking status: Former Smoker    Types: Cigarettes    Quit date: 1965    Years since quitting: 55.7  . Smokeless tobacco: Never Used  . Tobacco comment: smoked in college  Substance and Sexual Activity  . Alcohol use: Yes    Alcohol/week: 7.0 - 14.0 standard drinks    Types: 7 - 14 Glasses of wine per week    Comment: 1-2 glasses of wine per night  . Drug use: No  . Sexual activity: Never  Lifestyle  . Physical activity    Days per week: 0 days    Minutes per session: 0 min  . Stress: Not at all  Relationships  . Social Herbalist on phone: Patient refused    Gets together: Patient refused    Attends religious service: Patient refused    Active member of club or organization: Patient refused    Attends meetings of clubs or organizations: Patient refused    Relationship status: Patient refused  . Intimate partner violence    Fear of current or ex partner: Patient refused  Emotionally abused: Patient refused    Physically abused: Patient refused    Forced sexual activity: Patient refused  Other Topics Concern  . 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     FAMILY HISTORY: Family History  Problem Relation Age of Onset  . Cancer Mother        colon/rectal  . Colon cancer Mother   . Heart disease Father   . Hypertension Father   . Atrial fibrillation Father   . Diabetes Father   . Cancer Brother        lung  . Healthy Son   . Healthy Son   . Breast cancer Neg Hx     ALLERGIES:  is allergic to codeine; lorazepam; oxycodone; paregoric; penicillins; quinolones; and aleve [naproxen sodium].  MEDICATIONS:  Current Outpatient Medications  Medication Sig Dispense Refill  . ARIPiprazole (ABILIFY) 2 MG tablet Take 1 tablet (2 mg total) by mouth every evening. 90 tablet 2  . aspirin 81 MG  tablet Take 81 mg by mouth daily.    Marland Kitchen atorvastatin (LIPITOR) 40 MG tablet Take 1 tablet (40 mg total) by mouth daily. 90 tablet 3  . ferrous sulfate 324 MG TBEC Take 324 mg by mouth daily with breakfast.    . lisinopril (ZESTRIL) 5 MG tablet Take 1 tablet (5 mg total) by mouth daily. 90 tablet 1  . Multiple Vitamins-Minerals (CENTRUM SILVER PO) Take 1 tablet by mouth daily.    Marland Kitchen omeprazole (PRILOSEC) 40 MG capsule Take 40 mg by mouth daily before breakfast.    . oxybutynin (DITROPAN-XL) 10 MG 24 hr tablet TAKE 1 TABLET BY MOUTH EVERYDAY AT BEDTIME 90 tablet 1  . tiotropium (SPIRIVA) 18 MCG inhalation capsule Place 1 capsule (18 mcg total) into inhaler and inhale daily. 90 capsule 3  . acetaminophen (TYLENOL) 500 MG tablet Take 500 mg by mouth every 6 (six) hours as needed.    Marland Kitchen albuterol (PROVENTIL HFA;VENTOLIN HFA) 108 (90 Base) MCG/ACT inhaler Inhale 2 puffs into the lungs every 6 (six) hours as needed for wheezing or shortness of breath. (Patient not taking: Reported on 09/23/2018) 1 Inhaler 2  . bismuth subsalicylate (PEPTO BISMOL) 262 MG chewable tablet Chew 262 mg by mouth as needed. **capsule**    . calcium carbonate (TUMS - DOSED IN MG ELEMENTAL CALCIUM) 500 MG chewable tablet Chew 1 tablet by mouth as needed for indigestion or heartburn.    . clindamycin (CLEOCIN) 150 MG capsule Take 6,000 mg by mouth. One hour prior to dental procedure    . guaiFENesin (MUCINEX) 600 MG 12 hr tablet Take 1 tablet (600 mg total) by mouth 2 (two) times daily. (Patient not taking: Reported on 09/17/2018) 30 tablet 3  . Venlafaxine HCl 225 MG TB24 TAKE 1 TABLET (225 MG TOTAL) BY MOUTH DAILY. 90 tablet 2   No current facility-administered medications for this visit.    PHYSICAL EXAMINATION:   Vitals:   09/23/18 1531  BP: 120/80  Pulse: 80  Resp: 20  Temp: 98.7 F (37.1 C)   Filed Weights   09/23/18 1531  Weight: 183 lb (83 kg)    Physical Exam  Constitutional: She is oriented to person, place, and  time and well-developed, well-nourished, and in no distress.  HENT:  Head: Normocephalic and atraumatic.  Mouth/Throat: Oropharynx is clear and moist. No oropharyngeal exudate.  Eyes: Pupils are equal, round, and reactive to light.  Neck: Normal range of motion. Neck supple.  Cardiovascular: Normal rate and regular rhythm.  Pulmonary/Chest: No respiratory distress. She has no wheezes.  Abdominal: Soft. Bowel sounds are normal. She exhibits no distension and no mass. There is no abdominal tenderness. There is no rebound and no guarding.  Musculoskeletal: Normal range of motion.        General: No tenderness or edema.  Neurological: She is alert and oriented to person, place, and time.  Skin: Skin is warm.  Psychiatric: Affect normal.    LABORATORY DATA:  I have reviewed the data as listed Lab Results  Component Value Date   WBC 8.9 09/23/2018   HGB 11.2 (L) 09/23/2018   HCT 36.2 09/23/2018   MCV 81.0 09/23/2018   PLT 381 09/23/2018   Recent Labs    03/14/18 1104 09/17/18 1523  NA 142 140  K 4.3 4.3  CL 103 103  CO2 23 23  GLUCOSE 84 76  BUN 13 13  CREATININE 0.81 0.91  CALCIUM 9.4 9.3  GFRNONAA 70 61  GFRAA 81 70  PROT 6.6 6.2  ALBUMIN 4.4 4.5  AST 13 17  ALT 12 14  ALKPHOS 98 87  BILITOT 0.2 0.2     No results found.  Microcytic anemia #Mild microcytic anemia-hemoglobin 10.4 MCV 78.  Iron saturation is 10% ferritin is 44.  Question iron deficiency versus others.  For now recommend p.o. iron.  #Check CBC/copper/zinc/hemoglobinopathy evaluation/peripheral smear; LDH review of peripheral smear haptoglobin.   #Chronic diarrhea-currently stable.  Defer to PCP.   Thank you Dr.Bacigalupo for allowing me to participate in the care of your pleasant patient. Please do not hesitate to contact me with questions or concerns in the interim.  # DISPOSITION: # labs today # follow up in 2 weeks for results; No labs-Dr.B  All questions were answered. The patient knows to  call the clinic with any problems, questions or concerns.    Cammie Sickle, MD 10/06/2018 12:57 PM

## 2018-09-23 NOTE — Assessment & Plan Note (Addendum)
#  Mild microcytic anemia-hemoglobin 10.4 MCV 78.  Iron saturation is 10% ferritin is 44.  Question iron deficiency versus others.  For now recommend p.o. iron.  #Check CBC/copper/zinc/hemoglobinopathy evaluation/peripheral smear; LDH review of peripheral smear haptoglobin.   #Chronic diarrhea-currently stable.  Defer to PCP.   Thank you Dr.Bacigalupo for allowing me to participate in the care of your pleasant patient. Please do not hesitate to contact me with questions or concerns in the interim.  # DISPOSITION: # labs today # follow up in 2 weeks for results; No labs-Dr.B

## 2018-09-24 LAB — HEMOGLOBINOPATHY EVALUATION
Hgb A2 Quant: 1.8 % (ref 1.8–3.2)
Hgb A: 98.2 % (ref 96.4–98.8)
Hgb C: 0 %
Hgb F Quant: 0 % (ref 0.0–2.0)
Hgb S Quant: 0 %
Hgb Variant: 0 %

## 2018-09-24 LAB — HAPTOGLOBIN: Haptoglobin: 202 mg/dL (ref 42–346)

## 2018-09-28 LAB — ZINC: Zinc: 69 ug/dL (ref 56–134)

## 2018-09-28 LAB — COPPER, SERUM: Copper: 112 ug/dL (ref 72–166)

## 2018-10-02 ENCOUNTER — Other Ambulatory Visit: Payer: Self-pay | Admitting: Family Medicine

## 2018-10-07 ENCOUNTER — Other Ambulatory Visit: Payer: Self-pay

## 2018-10-07 ENCOUNTER — Inpatient Hospital Stay (HOSPITAL_BASED_OUTPATIENT_CLINIC_OR_DEPARTMENT_OTHER): Payer: Medicare Other | Admitting: Internal Medicine

## 2018-10-07 VITALS — BP 141/83 | HR 79 | Temp 97.0°F | Resp 18 | Wt 181.2 lb

## 2018-10-07 DIAGNOSIS — D509 Iron deficiency anemia, unspecified: Secondary | ICD-10-CM

## 2018-10-07 DIAGNOSIS — D500 Iron deficiency anemia secondary to blood loss (chronic): Secondary | ICD-10-CM | POA: Diagnosis not present

## 2018-10-07 DIAGNOSIS — D649 Anemia, unspecified: Secondary | ICD-10-CM | POA: Diagnosis not present

## 2018-10-07 DIAGNOSIS — E611 Iron deficiency: Secondary | ICD-10-CM | POA: Insufficient documentation

## 2018-10-07 LAB — URINALYSIS, COMPLETE (UACMP) WITH MICROSCOPIC
Bilirubin Urine: NEGATIVE
Glucose, UA: NEGATIVE mg/dL
Hgb urine dipstick: NEGATIVE
Ketones, ur: NEGATIVE mg/dL
Nitrite: NEGATIVE
Protein, ur: NEGATIVE mg/dL
Specific Gravity, Urine: 1.023 (ref 1.005–1.030)
pH: 5 (ref 5.0–8.0)

## 2018-10-07 NOTE — Progress Notes (Signed)
[facility] NOTE  Patient Care Team: Brita Romp Dionne Bucy, MD as PCP - General (Family Medicine) Watt Climes, PA as Physician Assistant (Physician Assistant) Marry Guan, Laurice Record, MD as Consulting Physician (Orthopedic Surgery) Pa, Falkner as Consulting Physician (Optometry) Bary Castilla, Forest Gleason, MD (General Surgery) Dillingham, Loel Lofty, DO as Attending Physician (Plastic Surgery) Manya Silvas, MD (Gastroenterology) Dasher, Rayvon Char, MD (Dermatology)  CHIEF COMPLAINTS/PURPOSE OF CONSULTATION: Anemia  HEMATOLOGY HISTORY  # ANEMIA EGD/ colonoscopy- 2017 [Dr.Elliot]-August 2020-hemoglobin 10.4 MCV 78; iron saturation 10% ferritin 44; zinc copper/LDH heptoglobin-  HISTORY OF PRESENTING ILLNESS:  Natasha Vasquez 78 y.o.  female with anemia is here for follow-up/review the results of blood work.  Patient continues to complain of extreme fatigue.  She denies any nausea vomiting abdominal pain.  She gets easily short of breath.  She is currently on p.o. iron.  Admits abdominal discomfort.  Chronic diarrhea.  Review of Systems  Constitutional: Positive for malaise/fatigue. Negative for chills, diaphoresis, fever and weight loss.  HENT: Negative for nosebleeds and sore throat.   Eyes: Negative for double vision.  Respiratory: Negative for cough, hemoptysis, sputum production, shortness of breath and wheezing.   Cardiovascular: Negative for chest pain, palpitations, orthopnea and leg swelling.  Gastrointestinal: Positive for diarrhea. Negative for abdominal pain, blood in stool, constipation, heartburn, melena, nausea and vomiting.  Genitourinary: Negative for dysuria, frequency and urgency.  Musculoskeletal: Positive for back pain and joint pain.  Skin: Negative.  Negative for itching and rash.  Neurological: Negative for dizziness, tingling, focal weakness, weakness and headaches.  Endo/Heme/Allergies: Does not bruise/bleed easily.  Psychiatric/Behavioral:  Negative for depression. The patient is not nervous/anxious and does not have insomnia.     MEDICAL HISTORY:  Past Medical History:  Diagnosis Date  . Anemia in pregnancy   . Arthritis    hands  . Barrett esophagus   . 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  ER/PR NEGATIVE  . Cancer ([place])    melanoma  . Complication of anesthesia    20 years ago pts b/p dropped really low  . Depression   . Depression   . Diabetes mellitus without complication (HCC)    diet controlled  . Family history of adverse reaction to anesthesia    son PONV  . GERD (gastroesophageal reflux disease)   . Hypercholesteremia   . Hyperlipidemia   . Hypertension   . MVP (mitral valve prolapse)    followed by PCP  . Nephritis   . Nephritis   . S/P appendectomy   . Torn meniscus    left    SURGICAL HISTORY: Past Surgical History:  Procedure Laterality Date  . ABDOMINAL HYSTERECTOMY    . APPENDECTOMY    . BREAST BIOPSY Right 04/25/2017   retroareolar 10:00   wing clip   path pending  . BREAST BIOPSY Right 04/25/2017   10:00  5CMFN  venus clip    path pending  . BREAST BIOPSY Right 05/03/2017   Affirm Bx- path pending  . BREAST CYST ASPIRATION Bilateral    NEG  . BREAST EXCISIONAL BIOPSY Left 20+ yrs ago   NEG  . BREAST RECONSTRUCTION WITH PLACEMENT OF TISSUE EXPANDER AND FLEX HD (ACELLULAR HYDRATED DERMIS) Right 06/03/2017   Procedure: BREAST RECONSTRUCTION WITH PLACEMENT OF TISSUE EXPANDER AND FLEX HD (ACELLULAR HYDRATED DERMIS);  Surgeon: Wallace Going, DO;  Location: [facility];  Service: Plastics;  Laterality: Right;  . BREAST REDUCTION SURGERY  Left 11/06/2017   Procedure: BREAST REDUCTION;  Surgeon: Wallace Going, DO;  Location: [facility];  Service: Plastics;  Laterality: Left;  . BROW LIFT Bilateral 02/01/2015   Procedure: BLEPHAROPLASTY bilateral upper eyelids.;  Surgeon: Karle Starch, MD;  Location: [facility];  Service: Ophthalmology;   Laterality: Bilateral;  DIABETIC - diet controlled  . CHOLECYSTECTOMY    . COLONOSCOPY    . COLONOSCOPY WITH PROPOFOL N/A 05/11/2015   Procedure: COLONOSCOPY WITH PROPOFOL;  Surgeon: Manya Silvas, MD;  Location: [facility];  Service: Endoscopy;  Laterality: N/A;  . ESOPHAGOGASTRODUODENOSCOPY    . ESOPHAGOGASTRODUODENOSCOPY (EGD) WITH PROPOFOL  05/11/2015   Procedure: ESOPHAGOGASTRODUODENOSCOPY (EGD) WITH PROPOFOL;  Surgeon: Manya Silvas, MD;  Location: [facility] ENDOSCOPY;  Service: Endoscopy;;  . FRACTURE SURGERY Right    arm and shoulder  . JOINT REPLACEMENT    . KNEE ARTHROSCOPY Left 03/14/2016   Procedure: ARTHROSCOPY KNEE, PARTIAL MEDIAL MENISECTOMY, CHONDROPLASTY;  Surgeon: Dereck Leep, MD;  Location: [facility];  Service: Orthopedics;  Laterality: Left;  Marland Kitchen MASTECTOMY Right 2019  . MASTECTOMY W/ SENTINEL NODE BIOPSY Right 06/03/2017   Procedure: MASTECTOMY WITH SENTINEL LYMPH NODE BIOPSY;  Surgeon: Robert Bellow, MD;  Location: [facility];  Service: General;  Laterality: Right;  . PARTIAL HIP ARTHROPLASTY Right   . REDUCTION MAMMAPLASTY Left 2019  . REMOVAL OF BILATERAL TISSUE EXPANDERS WITH PLACEMENT OF BILATERAL BREAST IMPLANTS Right 11/06/2017   Procedure: REMOVAL OF TISSUE EXPANDER WITH PLACEMENT OF BREAST IMPLANT;  Surgeon: Wallace Going, DO;  Location: [facility];  Service: Plastics;  Laterality: Right;  . TONSILLECTOMY      SOCIAL HISTORY: Social History   Socioeconomic History  . Marital status: Married    Spouse name: Sonia Side  . Number of children: 2  . Years of education: Not on file  . Highest education level: Master's degree (e.g., MA, MS, MEng, MEd, MSW, MBA)  Occupational History  . Occupation: retired  Scientific laboratory technician  . Financial resource strain: Not hard at all  . Food insecurity    Worry: Never true    Inability: Never true  . Transportation needs    Medical: No    Non-medical: No  Tobacco Use  . Smoking status: Former Smoker    Types:  Cigarettes    Quit date: 1965    Years since quitting: 55.7  . Smokeless tobacco: Never Used  . Tobacco comment: smoked in college  Substance and Sexual Activity  . Alcohol use: Yes    Alcohol/week: 7.0 - 14.0 standard drinks    Types: 7 - 14 Glasses of wine per week    Comment: 1-2 glasses of wine per night  . Drug use: No  . Sexual activity: Never  Lifestyle  . Physical activity    Days per week: 0 days    Minutes per session: 0 min  . Stress: Not at all  Relationships  . Social Herbalist on phone: Patient refused    Gets together: Patient refused    Attends religious service: Patient refused    Active member of club or organization: Patient refused    Attends meetings of clubs or organizations: Patient refused    Relationship status: Patient refused  . Intimate partner violence    Fear of current or ex partner: Patient refused    Emotionally abused: Patient refused    Physically abused: Patient refused    Forced sexual activity: Patient refused  Other Topics Concern  .  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     FAMILY HISTORY: Family History  Problem Relation Age of Onset  . Cancer Mother        colon/rectal  . Colon cancer Mother   . Heart disease Father   . Hypertension Father   . Atrial fibrillation Father   . Diabetes Father   . Cancer Brother        lung  . Healthy Son   . Healthy Son   . Breast cancer Neg Hx     ALLERGIES:  is allergic to codeine; lorazepam; oxycodone; paregoric; penicillins; quinolones; and aleve [naproxen sodium].  MEDICATIONS:  Current Outpatient Medications  Medication Sig Dispense Refill  . acetaminophen (TYLENOL) 500 MG tablet Take 500 mg by mouth every 6 (six) hours as needed.    . ARIPiprazole (ABILIFY) 2 MG tablet Take 1 tablet (2 mg total) by mouth every evening. 90 tablet 2  . aspirin 81 MG tablet Take 81 mg by mouth  daily.    Marland Kitchen atorvastatin (LIPITOR) 40 MG tablet Take 1 tablet (40 mg total) by mouth daily. 90 tablet 3  . bismuth subsalicylate (PEPTO BISMOL) 262 MG chewable tablet Chew 262 mg by mouth as needed. **capsule**    . calcium carbonate (TUMS - DOSED IN MG ELEMENTAL CALCIUM) 500 MG chewable tablet Chew 1 tablet by mouth as needed for indigestion or heartburn.    . clindamycin (CLEOCIN) 150 MG capsule Take 6,000 mg by mouth. One hour prior to dental procedure    . ferrous sulfate 324 MG TBEC Take 324 mg by mouth daily with breakfast.    . lisinopril (ZESTRIL) 5 MG tablet Take 1 tablet (5 mg total) by mouth daily. 90 tablet 1  . Multiple Vitamins-Minerals (CENTRUM SILVER PO) Take 1 tablet by mouth daily.    Marland Kitchen omeprazole (PRILOSEC) 40 MG capsule Take 40 mg by mouth daily before breakfast.    . oxybutynin (DITROPAN-XL) 10 MG 24 hr tablet TAKE 1 TABLET BY MOUTH EVERYDAY AT BEDTIME 90 tablet 1  . tiotropium (SPIRIVA) 18 MCG inhalation capsule Place 1 capsule (18 mcg total) into inhaler and inhale daily. 90 capsule 3  . Venlafaxine HCl 225 MG TB24 TAKE 1 TABLET (225 MG TOTAL) BY MOUTH DAILY. 90 tablet 2  . albuterol (PROVENTIL HFA;VENTOLIN HFA) 108 (90 Base) MCG/ACT inhaler Inhale 2 puffs into the lungs every 6 (six) hours as needed for wheezing or shortness of breath. (Patient not taking: Reported on 09/23/2018) 1 Inhaler 2  . guaiFENesin (MUCINEX) 600 MG 12 hr tablet Take 1 tablet (600 mg total) by mouth 2 (two) times daily. (Patient not taking: Reported on 09/17/2018) 30 tablet 3   No current facility-administered medications for this visit.    PHYSICAL EXAMINATION:   Vitals:   10/07/18 1425  BP: (!) 141/83  Pulse: 79  Resp: 18  Temp: (!) 97 F (36.1 C)   Filed Weights   10/07/18 1425  Weight: 181 lb 3.2 oz (82.2 kg)    Physical Exam  Constitutional: She is oriented to person, place, and time and well-developed, well-nourished, and in no distress.  HENT:  Head: Normocephalic and atraumatic.   Mouth/Throat: Oropharynx is clear and moist. No oropharyngeal exudate.  Eyes: Pupils are equal, round, and reactive to light.  Neck: Normal range of motion. Neck supple.  Cardiovascular: Normal rate and regular rhythm.  Pulmonary/Chest: No respiratory distress. She has  no wheezes.  Abdominal: Soft. Bowel sounds are normal. She exhibits no distension and no mass. There is no abdominal tenderness. There is no rebound and no guarding.  Musculoskeletal: Normal range of motion.        General: No tenderness or edema.  Neurological: She is alert and oriented to person, place, and time.  Skin: Skin is warm.  Psychiatric: Affect normal.    LABORATORY DATA:  I have reviewed the data as listed Lab Results  Component Value Date   WBC 8.9 09/23/2018   HGB 11.2 (L) 09/23/2018   HCT 36.2 09/23/2018   MCV 81.0 09/23/2018   PLT 381 09/23/2018   Recent Labs    03/14/18 1104 09/17/18 1523  NA 142 140  K 4.3 4.3  CL 103 103  CO2 23 23  GLUCOSE 84 76  BUN 13 13  CREATININE 0.81 0.91  CALCIUM 9.4 9.3  GFRNONAA 70 61  GFRAA 81 70  PROT 6.6 6.2  ALBUMIN 4.4 4.5  AST 13 17  ALT 12 14  ALKPHOS 98 87  BILITOT 0.2 0.2     No results found.  Iron deficiency #Iron deficient anemia-  Iron saturation is 10% ferritin is 44.  On p.o. iron; hemoglobin improving at 11.3.  However patient is significantly fatigued.  Interested in faster response.  #The etiology is unclear-I would recommend a UA; and CT scan of the abdomen pelvis for further evaluation of anemia.  Question chronic diarrhea malabsorption.  Await the results of the CT scan-if negative would recommend evaluation with GI.  # fatigue- ? IDA versus others.  Await a trial of IV Venofer.  Discussed the potential acute infusion reactions with IV iron; which are quite rare.  Patient understands the risk; will proceed with infusions.  # DISPOSITION: # CT A/P- in 1 week; Urine test # Venofer weekly x 2- statr next week.   # follow up in  2 months- cbc; possible Venofer- Dr.B   All questions were answered. The patient knows to call the clinic with any problems, questions or concerns.    Cammie Sickle, MD 10/07/2018 4:36 PM

## 2018-10-07 NOTE — Assessment & Plan Note (Deleted)
#  Mild microcytic anemia-hemoglobin 10.4 MCV 78.  Iron saturation is 10% ferritin is 44.   iron deficiency versus others.  For now recommend p.o. iron.  # fatigue- ? IDA-   #Chronic diarrhea-currently stable.  Defer to PCP.    # DISPOSITION: # CT A/P- in 1 week; Urine test # Venofer weekly x 2- statr next week.   # follow up in 2 months- cbc; possible Venofer- Dr.B

## 2018-10-07 NOTE — Assessment & Plan Note (Signed)
#  Iron deficient anemia-  Iron saturation is 10% ferritin is 44.  On p.o. iron; hemoglobin improving at 11.3.  However patient is significantly fatigued.  Interested in faster response.  #The etiology is unclear-I would recommend a UA; and CT scan of the abdomen pelvis for further evaluation of anemia.  Question chronic diarrhea malabsorption.  Await the results of the CT scan-if negative would recommend evaluation with GI.  # fatigue- ? IDA versus others.  Await a trial of IV Venofer.  Discussed the potential acute infusion reactions with IV iron; which are quite rare.  Patient understands the risk; will proceed with infusions.  # DISPOSITION: # CT A/P- in 1 week; Urine test # Venofer weekly x 2- statr next week.   # follow up in 2 months- cbc; possible Venofer- Dr.B

## 2018-10-15 ENCOUNTER — Inpatient Hospital Stay: Payer: Medicare Other

## 2018-10-15 ENCOUNTER — Other Ambulatory Visit: Payer: Self-pay

## 2018-10-15 VITALS — BP 111/74 | HR 72 | Temp 97.8°F | Resp 18

## 2018-10-15 DIAGNOSIS — E611 Iron deficiency: Secondary | ICD-10-CM

## 2018-10-15 DIAGNOSIS — D649 Anemia, unspecified: Secondary | ICD-10-CM | POA: Diagnosis not present

## 2018-10-15 MED ORDER — IRON SUCROSE 20 MG/ML IV SOLN
200.0000 mg | Freq: Once | INTRAVENOUS | Status: AC
  Administered 2018-10-15: 200 mg via INTRAVENOUS
  Filled 2018-10-15: qty 10

## 2018-10-15 MED ORDER — SODIUM CHLORIDE 0.9 % IV SOLN
Freq: Once | INTRAVENOUS | Status: AC
  Administered 2018-10-15: 14:00:00 via INTRAVENOUS
  Filled 2018-10-15: qty 250

## 2018-10-15 MED ORDER — SODIUM CHLORIDE 0.9 % IV SOLN: 200.0000 mg | Freq: Once | INTRAVENOUS | Status: DC

## 2018-10-15 NOTE — Progress Notes (Signed)
Pt tolerated Venofer infusion well. Pt and VS stable at discharge.

## 2018-10-17 ENCOUNTER — Ambulatory Visit
Admission: RE | Admit: 2018-10-17 | Discharge: 2018-10-17 | Disposition: A | Discharge status: Home / Self Care | Payer: Medicare Other | Source: Ambulatory Visit | Attending: Internal Medicine | Admitting: Internal Medicine

## 2018-10-17 ENCOUNTER — Other Ambulatory Visit: Payer: Self-pay

## 2018-10-17 DIAGNOSIS — D500 Iron deficiency anemia secondary to blood loss (chronic): Secondary | ICD-10-CM | POA: Insufficient documentation

## 2018-10-17 DIAGNOSIS — I712 Thoracic aortic aneurysm, without rupture, unspecified: Secondary | ICD-10-CM

## 2018-10-17 HISTORY — DX: Malignant melanoma of skin, unspecified: C43.9

## 2018-10-17 MED ORDER — IOHEXOL 350 MG/ML SOLN
100.0000 mL | Freq: Once | INTRAVENOUS | Status: AC | PRN
  Administered 2018-10-17: 10:00:00 100 mL via INTRAVENOUS

## 2018-10-21 ENCOUNTER — Other Ambulatory Visit: Payer: Self-pay

## 2018-10-22 ENCOUNTER — Other Ambulatory Visit: Payer: Self-pay

## 2018-10-22 ENCOUNTER — Inpatient Hospital Stay: Payer: Medicare Other | Attending: Internal Medicine

## 2018-10-22 VITALS — BP 130/73 | HR 73 | Temp 97.4°F | Resp 18

## 2018-10-22 DIAGNOSIS — D509 Iron deficiency anemia, unspecified: Secondary | ICD-10-CM | POA: Insufficient documentation

## 2018-10-22 DIAGNOSIS — E611 Iron deficiency: Secondary | ICD-10-CM

## 2018-10-22 MED ORDER — SODIUM CHLORIDE 0.9 % IV SOLN: 200.0000 mg | Freq: Once | INTRAVENOUS | Status: DC

## 2018-10-22 MED ORDER — SODIUM CHLORIDE 0.9 % IV SOLN
Freq: Once | INTRAVENOUS | Status: AC
  Administered 2018-10-22: 14:00:00 via INTRAVENOUS
  Filled 2018-10-22: qty 250

## 2018-10-22 MED ORDER — IRON SUCROSE 20 MG/ML IV SOLN
200.0000 mg | Freq: Once | INTRAVENOUS | Status: AC
  Administered 2018-10-22: 200 mg via INTRAVENOUS
  Filled 2018-10-22: qty 10

## 2018-11-03 ENCOUNTER — Other Ambulatory Visit: Payer: Self-pay | Admitting: Family Medicine

## 2018-11-03 DIAGNOSIS — I10 Essential (primary) hypertension: Secondary | ICD-10-CM

## 2018-11-25 LAB — HM DIABETES EYE EXAM

## 2018-11-27 ENCOUNTER — Encounter: Payer: Self-pay | Admitting: Family Medicine

## 2018-11-28 ENCOUNTER — Encounter: Payer: Self-pay | Admitting: Internal Medicine

## 2018-11-28 ENCOUNTER — Other Ambulatory Visit: Payer: Self-pay | Admitting: Cardiothoracic Surgery

## 2018-11-28 ENCOUNTER — Other Ambulatory Visit: Payer: Self-pay

## 2018-11-28 DIAGNOSIS — I712 Thoracic aortic aneurysm, without rupture, unspecified: Secondary | ICD-10-CM

## 2018-12-01 ENCOUNTER — Inpatient Hospital Stay: Payer: Medicare Other | Attending: Internal Medicine | Admitting: Internal Medicine

## 2018-12-01 ENCOUNTER — Other Ambulatory Visit: Payer: Self-pay

## 2018-12-01 ENCOUNTER — Inpatient Hospital Stay: Payer: Medicare Other

## 2018-12-01 VITALS — BP 136/73 | HR 69 | Resp 18

## 2018-12-01 DIAGNOSIS — E785 Hyperlipidemia, unspecified: Secondary | ICD-10-CM | POA: Insufficient documentation

## 2018-12-01 DIAGNOSIS — I10 Essential (primary) hypertension: Secondary | ICD-10-CM | POA: Diagnosis not present

## 2018-12-01 DIAGNOSIS — Z87891 Personal history of nicotine dependence: Secondary | ICD-10-CM | POA: Insufficient documentation

## 2018-12-01 DIAGNOSIS — E611 Iron deficiency: Secondary | ICD-10-CM

## 2018-12-01 DIAGNOSIS — Z90710 Acquired absence of both cervix and uterus: Secondary | ICD-10-CM | POA: Diagnosis not present

## 2018-12-01 DIAGNOSIS — E119 Type 2 diabetes mellitus without complications: Secondary | ICD-10-CM | POA: Insufficient documentation

## 2018-12-01 DIAGNOSIS — Z7982 Long term (current) use of aspirin: Secondary | ICD-10-CM | POA: Insufficient documentation

## 2018-12-01 DIAGNOSIS — D509 Iron deficiency anemia, unspecified: Secondary | ICD-10-CM | POA: Insufficient documentation

## 2018-12-01 DIAGNOSIS — Z79899 Other long term (current) drug therapy: Secondary | ICD-10-CM | POA: Insufficient documentation

## 2018-12-01 DIAGNOSIS — Z801 Family history of malignant neoplasm of trachea, bronchus and lung: Secondary | ICD-10-CM | POA: Diagnosis not present

## 2018-12-01 DIAGNOSIS — I712 Thoracic aortic aneurysm, without rupture, unspecified: Secondary | ICD-10-CM

## 2018-12-01 DIAGNOSIS — Z8249 Family history of ischemic heart disease and other diseases of the circulatory system: Secondary | ICD-10-CM | POA: Diagnosis not present

## 2018-12-01 DIAGNOSIS — E7800 Pure hypercholesterolemia, unspecified: Secondary | ICD-10-CM | POA: Insufficient documentation

## 2018-12-01 DIAGNOSIS — Z800 Family history of malignant neoplasm of digestive organs: Secondary | ICD-10-CM | POA: Insufficient documentation

## 2018-12-01 DIAGNOSIS — D500 Iron deficiency anemia secondary to blood loss (chronic): Secondary | ICD-10-CM

## 2018-12-01 LAB — CBC WITH DIFFERENTIAL/PLATELET
Abs Immature Granulocytes: 0.04 10*3/uL (ref 0.00–0.07)
Basophils Absolute: 0.1 10*3/uL (ref 0.0–0.1)
Basophils Relative: 1 %
Eosinophils Absolute: 0.2 10*3/uL (ref 0.0–0.5)
Eosinophils Relative: 3 %
HCT: 38.2 % (ref 36.0–46.0)
Hemoglobin: 12 g/dL (ref 12.0–15.0)
Immature Granulocytes: 1 %
Lymphocytes Relative: 24 %
Lymphs Abs: 1.5 10*3/uL (ref 0.7–4.0)
MCH: 26.4 pg (ref 26.0–34.0)
MCHC: 31.4 g/dL (ref 30.0–36.0)
MCV: 84.1 fL (ref 80.0–100.0)
Monocytes Absolute: 0.5 10*3/uL (ref 0.1–1.0)
Monocytes Relative: 8 %
Neutro Abs: 4 10*3/uL (ref 1.7–7.7)
Neutrophils Relative %: 63 %
Platelets: 310 10*3/uL (ref 150–400)
RBC: 4.54 MIL/uL (ref 3.87–5.11)
RDW: 16.7 % — ABNORMAL HIGH (ref 11.5–15.5)
WBC: 6.3 10*3/uL (ref 4.0–10.5)
nRBC: 0 % (ref 0.0–0.2)

## 2018-12-01 MED ORDER — SODIUM CHLORIDE 0.9 % IV SOLN
Freq: Once | INTRAVENOUS | Status: AC
  Administered 2018-12-01: 15:00:00 via INTRAVENOUS
  Filled 2018-12-01: qty 250

## 2018-12-01 MED ORDER — IRON SUCROSE 20 MG/ML IV SOLN
200.0000 mg | Freq: Once | INTRAVENOUS | Status: AC
  Administered 2018-12-01: 16:00:00 200 mg via INTRAVENOUS
  Filled 2018-12-01: qty 10

## 2018-12-01 MED ORDER — SODIUM CHLORIDE 0.9 % IV SOLN: 200.0000 mg | Freq: Once | INTRAVENOUS | Status: DC

## 2018-12-01 NOTE — Assessment & Plan Note (Addendum)
#  Iron deficient anemia-  Iron saturation is 10% ferritin is 44.  S/p Venofer-symptoms of fatigue improved; hemoglobin is 12.   #Proceed with Venofer today.   #The etiology of iron deficiency is unclear-CT abdomen pelvis negative.  If patient continues to have iron deficiency.  Will recommend further GI evaluation.  # DISPOSITION: # Venfoer IV today  # follow up in 3 months- cbc/iron studies/ferritin; possible Venofer- Dr.B

## 2018-12-02 NOTE — Progress Notes (Signed)
[place] NOTE  Patient Care Team: Brita Romp Dionne Bucy, MD as PCP - General (Family Medicine) Watt Climes, PA as Physician Assistant (Physician Assistant) Marry Guan, Laurice Record, MD as Consulting Physician (Orthopedic Surgery) Pa, [place] as Consulting Physician (Optometry) Bary Castilla, Forest Gleason, MD (General Surgery) Dillingham, Loel Lofty, DO as Attending Physician (Plastic Surgery) Manya Silvas, MD (Inactive) (Gastroenterology) Dasher, Rayvon Char, MD (Dermatology)  CHIEF COMPLAINTS/PURPOSE OF CONSULTATION: Anemia  HEMATOLOGY HISTORY  # ANEMIA EGD/ colonoscopy- 2017 [Dr.Elliot]-August 2020-hemoglobin 10.4 MCV 78; iron saturation 10% ferritin 44; zinc copper/LDH heptoglobin-Normal. CT-C/A-P-NEG.   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  female with iron deficient anemia-unclear etiology is here for follow-up.  Patient notes her improvement of fatigue post IV iron infusion.  However not complete as well.  Otherwise no blood in stools or black colored stools.  Review of Systems  Constitutional: Positive for malaise/fatigue. Negative for chills, diaphoresis, fever and weight loss.  HENT: Negative for nosebleeds and sore throat.   Eyes: Negative for double vision.  Respiratory: Negative for cough, hemoptysis, sputum production, shortness of breath and wheezing.   Cardiovascular: Negative for chest pain, palpitations, orthopnea and leg swelling.  Gastrointestinal: Positive for diarrhea. Negative for abdominal pain, blood in stool, constipation, heartburn, melena, nausea and vomiting.  Genitourinary: Negative for dysuria, frequency and urgency.  Musculoskeletal: Positive for back pain and joint pain.  Skin: Negative.  Negative for itching and rash.  Neurological: Negative for dizziness, tingling, focal weakness, weakness and headaches.  Endo/Heme/Allergies: Does not bruise/bleed easily.   Psychiatric/Behavioral: Negative for depression. The patient is not nervous/anxious and does not have insomnia.     MEDICAL HISTORY:  Past Medical History:  Diagnosis Date  . Anemia in pregnancy   . Arthritis    hands  . Barrett esophagus   . 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  ER/PR NEGATIVE  . Complication of anesthesia    20 years ago pts b/p dropped really low  . Depression   . Depression   . Diabetes mellitus without complication (HCC)    diet controlled  . Family history of adverse reaction to anesthesia    son PONV  . GERD (gastroesophageal reflux disease)   . Hypercholesteremia   . Hyperlipidemia   . Hypertension   . Melanoma ([place])   . MVP (mitral valve prolapse)    followed by PCP  . Nephritis   . Nephritis   . S/P appendectomy   . Torn meniscus    left    SURGICAL HISTORY: Past Surgical History:  Procedure Laterality Date  . ABDOMINAL HYSTERECTOMY    . APPENDECTOMY    . BREAST BIOPSY Right 04/25/2017   retroareolar 10:00   wing clip   path pending  . BREAST BIOPSY Right 04/25/2017   10:00  5CMFN  venus clip    path pending  . BREAST BIOPSY Right 05/03/2017   Affirm Bx- path pending  . BREAST CYST ASPIRATION Bilateral    NEG  . BREAST EXCISIONAL BIOPSY Left 20+ yrs ago   NEG  . BREAST RECONSTRUCTION WITH PLACEMENT OF TISSUE EXPANDER AND FLEX HD (ACELLULAR HYDRATED DERMIS) Right 06/03/2017   Procedure: BREAST RECONSTRUCTION WITH PLACEMENT OF TISSUE EXPANDER AND FLEX HD (ACELLULAR HYDRATED DERMIS);  Surgeon: Wallace Going, DO;  Location: [facility];  Service: Plastics;  Laterality: Right;  . BREAST REDUCTION SURGERY Left 11/06/2017   Procedure: BREAST  REDUCTION;  Surgeon: Wallace Going, DO;  Location: [facility];  Service: Plastics;  Laterality: Left;  . BROW LIFT Bilateral 02/01/2015   Procedure: BLEPHAROPLASTY bilateral upper eyelids.;  Surgeon: Karle Starch, MD;  Location: [place];  Service:  Ophthalmology;  Laterality: Bilateral;  DIABETIC - diet controlled  . CHOLECYSTECTOMY    . COLONOSCOPY    . COLONOSCOPY WITH PROPOFOL N/A 05/11/2015   Procedure: COLONOSCOPY WITH PROPOFOL;  Surgeon: Manya Silvas, MD;  Location: [facility];  Service: Endoscopy;  Laterality: N/A;  . ESOPHAGOGASTRODUODENOSCOPY    . ESOPHAGOGASTRODUODENOSCOPY (EGD) WITH PROPOFOL  05/11/2015   Procedure: ESOPHAGOGASTRODUODENOSCOPY (EGD) WITH PROPOFOL;  Surgeon: Manya Silvas, MD;  Location: [facility];  Service: Endoscopy;;  . FRACTURE SURGERY Right    arm and shoulder  . JOINT REPLACEMENT    . KNEE ARTHROSCOPY Left 03/14/2016   Procedure: ARTHROSCOPY KNEE, PARTIAL MEDIAL MENISECTOMY, CHONDROPLASTY;  Surgeon: Dereck Leep, MD;  Location: [facility];  Service: Orthopedics;  Laterality: Left;  Marland Kitchen MASTECTOMY Right 2019  . MASTECTOMY W/ SENTINEL NODE BIOPSY Right 06/03/2017   Procedure: MASTECTOMY WITH SENTINEL LYMPH NODE BIOPSY;  Surgeon: Robert Bellow, MD;  Location: [facility];  Service: General;  Laterality: Right;  . PARTIAL HIP ARTHROPLASTY Right   . REDUCTION MAMMAPLASTY Left 2019  . REMOVAL OF BILATERAL TISSUE EXPANDERS WITH PLACEMENT OF BILATERAL BREAST IMPLANTS Right 11/06/2017   Procedure: REMOVAL OF TISSUE EXPANDER WITH PLACEMENT OF BREAST IMPLANT;  Surgeon: Wallace Going, DO;  Location: [facility];  Service: Plastics;  Laterality: Right;  . TONSILLECTOMY      SOCIAL HISTORY: Social History   Socioeconomic History  . Marital status: Married    Spouse name: Sonia Side  . Number of children: 2  . Years of education: Not on file  . Highest education level: Master's degree (e.g., MA, MS, MEng, MEd, MSW, MBA)  Occupational History  . Occupation: retired  Scientific laboratory technician  . Financial resource strain: Not hard at all  . Food insecurity    Worry: Never true    Inability: Never true  . Transportation needs    Medical: No    Non-medical: No  Tobacco Use  . Smoking status: Former Smoker     Types: Cigarettes    Quit date: 1965    Years since quitting: 55.9  . Smokeless tobacco: Never Used  . Tobacco comment: smoked in college  Substance and Sexual Activity  . Alcohol use: Yes    Alcohol/week: 7.0 - 14.0 standard drinks    Types: 7 - 14 Glasses of wine per week    Comment: 1-2 glasses of wine per night  . Drug use: No  . Sexual activity: Never  Lifestyle  . Physical activity    Days per week: 0 days    Minutes per session: 0 min  . Stress: Not at all  Relationships  . Social Herbalist on phone: Patient refused    Gets together: Patient refused    Attends religious service: Patient refused    Active member of club or organization: Patient refused    Attends meetings of clubs or organizations: Patient refused    Relationship status: Patient refused  . Intimate partner violence    Fear of current or ex partner: Patient refused    Emotionally abused: Patient refused    Physically abused: Patient refused    Forced sexual activity: Patient refused  Other Topics Concern  . 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     FAMILY HISTORY: Family History  Problem Relation Age of Onset  . Cancer Mother        colon/rectal  . Colon cancer Mother   . Heart disease Father   . Hypertension Father   . Atrial fibrillation Father   . Diabetes Father   . Cancer Brother        lung  . Healthy Son   . Healthy Son   . Breast cancer Neg Hx     ALLERGIES:  is allergic to codeine; lorazepam; oxycodone; paregoric; penicillins; quinolones; and aleve [naproxen sodium].  MEDICATIONS:  Current Outpatient Medications  Medication Sig Dispense Refill  . acetaminophen (TYLENOL) 500 MG tablet Take 500 mg by mouth every 6 (six) hours as needed.    Marland Kitchen albuterol (PROVENTIL HFA;VENTOLIN HFA) 108 (90 Base) MCG/ACT inhaler Inhale 2 puffs into the lungs every 6 (six) hours as needed for  wheezing or shortness of breath. 1 Inhaler 2  . ARIPiprazole (ABILIFY) 2 MG tablet Take 1 tablet (2 mg total) by mouth every evening. 90 tablet 2  . aspirin 81 MG tablet Take 81 mg by mouth daily.    Marland Kitchen atorvastatin (LIPITOR) 40 MG tablet Take 1 tablet (40 mg total) by mouth daily. 90 tablet 3  . ferrous sulfate 324 MG TBEC Take 324 mg by mouth daily with breakfast.    . lisinopril (ZESTRIL) 5 MG tablet TAKE 1 TABLET BY MOUTH EVERY DAY 90 tablet 1  . Multiple Vitamins-Minerals (CENTRUM SILVER PO) Take 1 tablet by mouth daily.    Marland Kitchen omeprazole (PRILOSEC) 40 MG capsule Take 40 mg by mouth 2 (two) times daily.     Marland Kitchen oxybutynin (DITROPAN-XL) 10 MG 24 hr tablet TAKE 1 TABLET BY MOUTH EVERYDAY AT BEDTIME 90 tablet 1  . tiotropium (SPIRIVA) 18 MCG inhalation capsule Place 1 capsule (18 mcg total) into inhaler and inhale daily. 90 capsule 3  . Venlafaxine HCl 225 MG TB24 TAKE 1 TABLET (225 MG TOTAL) BY MOUTH DAILY. 90 tablet 2  . bismuth subsalicylate (PEPTO BISMOL) 262 MG chewable tablet Chew 262 mg by mouth as needed. **capsule**    . calcium carbonate (TUMS - DOSED IN MG ELEMENTAL CALCIUM) 500 MG chewable tablet Chew 1 tablet by mouth as needed for indigestion or heartburn.    . clindamycin (CLEOCIN) 150 MG capsule Take 6,000 mg by mouth. One hour prior to dental procedure     No current facility-administered medications for this visit.    PHYSICAL EXAMINATION:   Vitals:   12/01/18 1450  BP: 125/87  Temp: 97.8 F (36.6 C)   Filed Weights   12/01/18 1450  Weight: 180 lb 4 oz (81.8 kg)    Physical Exam  Constitutional: She is oriented to person, place, and time and well-developed, well-nourished, and in no distress.  HENT:  Head: Normocephalic and atraumatic.  Mouth/Throat: Oropharynx is clear and moist. No oropharyngeal exudate.  Eyes: Pupils are equal, round, and reactive to light.  Neck: Normal range of motion. Neck supple.  Cardiovascular: Normal rate and regular rhythm.   Pulmonary/Chest: No respiratory distress. She has no wheezes.  Abdominal: Soft. Bowel sounds are normal. She exhibits no distension and no mass. There is no abdominal tenderness. There is no rebound and no guarding.  Musculoskeletal: Normal range of motion.        General: No tenderness or edema.  Neurological: She is  alert and oriented to person, place, and time.  Skin: Skin is warm.  Psychiatric: Affect normal.    LABORATORY DATA:  I have reviewed the data as listed Lab Results  Component Value Date   WBC 6.3 12/01/2018   HGB 12.0 12/01/2018   HCT 38.2 12/01/2018   MCV 84.1 12/01/2018   PLT 310 12/01/2018   Recent Labs    03/14/18 1104 09/17/18 1523  NA 142 140  K 4.3 4.3  CL 103 103  CO2 23 23  GLUCOSE 84 76  BUN 13 13  CREATININE 0.81 0.91  CALCIUM 9.4 9.3  GFRNONAA 70 61  GFRAA 81 70  PROT 6.6 6.2  ALBUMIN 4.4 4.5  AST 13 17  ALT 12 14  ALKPHOS 98 87  BILITOT 0.2 0.2     No results found.  Iron deficiency #Iron deficient anemia-  Iron saturation is 10% ferritin is 44.  S/p Venofer-symptoms of fatigue improved; hemoglobin is 12.   #Proceed with Venofer today.   #The etiology of iron deficiency is unclear-CT abdomen pelvis negative.  If patient continues to have iron deficiency.  Will recommend further GI evaluation.  # DISPOSITION: # Venfoer IV today  # follow up in 3 months- cbc/iron studies/ferritin; possible Venofer- Dr.B   All questions were answered. The patient knows to call the clinic with any problems, questions or concerns.    Cammie Sickle, MD 12/02/2018 7:11 AM

## 2019-01-01 ENCOUNTER — Ambulatory Visit: Payer: Medicare Other | Admitting: Cardiothoracic Surgery

## 2019-01-06 ENCOUNTER — Encounter: Payer: Self-pay | Admitting: Ophthalmology

## 2019-01-06 ENCOUNTER — Other Ambulatory Visit: Payer: Self-pay

## 2019-01-08 ENCOUNTER — Other Ambulatory Visit: Payer: Self-pay | Admitting: Cardiothoracic Surgery

## 2019-01-08 DIAGNOSIS — I712 Thoracic aortic aneurysm, without rupture, unspecified: Secondary | ICD-10-CM

## 2019-01-12 ENCOUNTER — Other Ambulatory Visit
Admission: RE | Admit: 2019-01-12 | Discharge: 2019-01-12 | Disposition: A | Discharge status: Home / Self Care | Payer: Medicare Other | Source: Ambulatory Visit | Attending: Ophthalmology | Admitting: Ophthalmology

## 2019-01-12 ENCOUNTER — Other Ambulatory Visit: Payer: Self-pay

## 2019-01-13 ENCOUNTER — Other Ambulatory Visit
Admission: RE | Admit: 2019-01-13 | Discharge: 2019-01-13 | Disposition: A | Discharge status: Home / Self Care | Payer: Medicare Other | Source: Ambulatory Visit | Attending: Ophthalmology | Admitting: Ophthalmology

## 2019-01-13 DIAGNOSIS — Z20828 Contact with and (suspected) exposure to other viral communicable diseases: Secondary | ICD-10-CM | POA: Diagnosis not present

## 2019-01-13 DIAGNOSIS — Z01812 Encounter for preprocedural laboratory examination: Secondary | ICD-10-CM | POA: Insufficient documentation

## 2019-01-13 NOTE — Discharge Instructions (Signed)

## 2019-01-14 ENCOUNTER — Ambulatory Visit: Payer: Medicare Other | Admitting: Anesthesiology

## 2019-01-14 ENCOUNTER — Encounter: Payer: Self-pay | Admitting: Ophthalmology

## 2019-01-14 ENCOUNTER — Ambulatory Visit
Admission: RE | Admit: 2019-01-14 | Discharge: 2019-01-14 | Disposition: A | Discharge status: Home / Self Care | Payer: Medicare Other | Attending: Ophthalmology | Admitting: Ophthalmology

## 2019-01-14 ENCOUNTER — Other Ambulatory Visit: Payer: Self-pay

## 2019-01-14 ENCOUNTER — Encounter
Admission: RE | Disposition: A | Discharge status: Home / Self Care | Payer: Self-pay | Source: Home / Self Care | Attending: Ophthalmology

## 2019-01-14 DIAGNOSIS — Z888 Allergy status to other drugs, medicaments and biological substances status: Secondary | ICD-10-CM | POA: Diagnosis not present

## 2019-01-14 DIAGNOSIS — I10 Essential (primary) hypertension: Secondary | ICD-10-CM | POA: Diagnosis not present

## 2019-01-14 DIAGNOSIS — E1136 Type 2 diabetes mellitus with diabetic cataract: Secondary | ICD-10-CM | POA: Diagnosis not present

## 2019-01-14 DIAGNOSIS — K219 Gastro-esophageal reflux disease without esophagitis: Secondary | ICD-10-CM | POA: Insufficient documentation

## 2019-01-14 DIAGNOSIS — Z883 Allergy status to other anti-infective agents status: Secondary | ICD-10-CM | POA: Diagnosis not present

## 2019-01-14 DIAGNOSIS — Z79899 Other long term (current) drug therapy: Secondary | ICD-10-CM | POA: Insufficient documentation

## 2019-01-14 DIAGNOSIS — Z87891 Personal history of nicotine dependence: Secondary | ICD-10-CM | POA: Insufficient documentation

## 2019-01-14 DIAGNOSIS — Z880 Allergy status to penicillin: Secondary | ICD-10-CM | POA: Insufficient documentation

## 2019-01-14 DIAGNOSIS — E669 Obesity, unspecified: Secondary | ICD-10-CM | POA: Diagnosis not present

## 2019-01-14 DIAGNOSIS — D509 Iron deficiency anemia, unspecified: Secondary | ICD-10-CM | POA: Diagnosis not present

## 2019-01-14 DIAGNOSIS — J439 Emphysema, unspecified: Secondary | ICD-10-CM | POA: Insufficient documentation

## 2019-01-14 DIAGNOSIS — E785 Hyperlipidemia, unspecified: Secondary | ICD-10-CM | POA: Diagnosis not present

## 2019-01-14 DIAGNOSIS — H2512 Age-related nuclear cataract, left eye: Secondary | ICD-10-CM | POA: Diagnosis present

## 2019-01-14 DIAGNOSIS — M1990 Unspecified osteoarthritis, unspecified site: Secondary | ICD-10-CM | POA: Diagnosis not present

## 2019-01-14 DIAGNOSIS — Z853 Personal history of malignant neoplasm of breast: Secondary | ICD-10-CM | POA: Diagnosis not present

## 2019-01-14 DIAGNOSIS — Z6830 Body mass index (BMI) 30.0-30.9, adult: Secondary | ICD-10-CM | POA: Diagnosis not present

## 2019-01-14 DIAGNOSIS — Z885 Allergy status to narcotic agent status: Secondary | ICD-10-CM | POA: Insufficient documentation

## 2019-01-14 DIAGNOSIS — E7800 Pure hypercholesterolemia, unspecified: Secondary | ICD-10-CM | POA: Insufficient documentation

## 2019-01-14 DIAGNOSIS — Z7982 Long term (current) use of aspirin: Secondary | ICD-10-CM | POA: Insufficient documentation

## 2019-01-14 DIAGNOSIS — E1142 Type 2 diabetes mellitus with diabetic polyneuropathy: Secondary | ICD-10-CM | POA: Diagnosis not present

## 2019-01-14 DIAGNOSIS — Z886 Allergy status to analgesic agent status: Secondary | ICD-10-CM | POA: Insufficient documentation

## 2019-01-14 DIAGNOSIS — Z792 Long term (current) use of antibiotics: Secondary | ICD-10-CM | POA: Insufficient documentation

## 2019-01-14 HISTORY — DX: Chronic cough: R05.3

## 2019-01-14 HISTORY — DX: Thoracic aortic aneurysm, without rupture: I71.2

## 2019-01-14 HISTORY — PX: CATARACT EXTRACTION W/PHACO: SHX586

## 2019-01-14 HISTORY — DX: Thoracic aortic aneurysm, without rupture, unspecified: I71.20

## 2019-01-14 LAB — SARS CORONAVIRUS 2 (TAT 6-24 HRS): SARS Coronavirus 2: NEGATIVE

## 2019-01-14 SURGERY — PHACOEMULSIFICATION, CATARACT, WITH IOL INSERTION
Anesthesia: Monitor Anesthesia Care | Site: Eye | Laterality: Left

## 2019-01-14 MED ORDER — LIDOCAINE HCL (PF) 2 % IJ SOLN
INTRAOCULAR | Status: DC | PRN
  Administered 2019-01-14: 08:00:00 2 mL

## 2019-01-14 MED ORDER — BRIMONIDINE TARTRATE-TIMOLOL 0.2-0.5 % OP SOLN
OPHTHALMIC | Status: DC | PRN
  Administered 2019-01-14: 1 [drp] via OPHTHALMIC

## 2019-01-14 MED ORDER — EPINEPHRINE PF 1 MG/ML IJ SOLN
INTRAOCULAR | Status: DC | PRN
  Administered 2019-01-14: 08:00:00 49 mL via OPHTHALMIC

## 2019-01-14 MED ORDER — FENTANYL CITRATE (PF) 100 MCG/2ML IJ SOLN
INTRAMUSCULAR | Status: DC | PRN
  Administered 2019-01-14: 50 ug via INTRAVENOUS

## 2019-01-14 MED ORDER — ACETAMINOPHEN 160 MG/5ML PO SOLN: 325.0000 mg | Freq: Once | ORAL | Status: DC

## 2019-01-14 MED ORDER — ACETAMINOPHEN 325 MG PO TABS: 325.0000 mg | ORAL_TABLET | Freq: Once | ORAL | Status: DC

## 2019-01-14 MED ORDER — MOXIFLOXACIN HCL 0.5 % OP SOLN
OPHTHALMIC | Status: DC | PRN
  Administered 2019-01-14: 0.2 mL via OPHTHALMIC

## 2019-01-14 MED ORDER — MOXIFLOXACIN HCL 0.5 % OP SOLN
1.0000 [drp] | OPHTHALMIC | Status: DC | PRN
  Administered 2019-01-14 (×3): 1 [drp] via OPHTHALMIC

## 2019-01-14 MED ORDER — TETRACAINE HCL 0.5 % OP SOLN
1.0000 [drp] | OPHTHALMIC | Status: DC | PRN
  Administered 2019-01-14 (×3): 1 [drp] via OPHTHALMIC

## 2019-01-14 MED ORDER — NA HYALUR & NA CHOND-NA HYALUR 0.4-0.35 ML IO KIT
PACK | INTRAOCULAR | Status: DC | PRN
  Administered 2019-01-14: 1 mL via INTRAOCULAR

## 2019-01-14 MED ORDER — ARMC OPHTHALMIC DILATING DROPS
1.0000 "application " | OPHTHALMIC | Status: DC | PRN
  Administered 2019-01-14 (×3): 1 via OPHTHALMIC

## 2019-01-14 MED ORDER — MIDAZOLAM HCL 2 MG/2ML IJ SOLN
INTRAMUSCULAR | Status: DC | PRN
  Administered 2019-01-14: 2 mg via INTRAVENOUS

## 2019-01-14 SURGICAL SUPPLY — 18 items
CANNULA ANT/CHMB 27GA (MISCELLANEOUS) ×6 IMPLANT
GLOVE SURG LX 7.5 STRW (GLOVE) ×2
GLOVE SURG LX STRL 7.5 STRW (GLOVE) ×1 IMPLANT
GLOVE SURG TRIUMPH 8.0 PF LTX (GLOVE) ×3 IMPLANT
GOWN STRL REUS W/ TWL LRG LVL3 (GOWN DISPOSABLE) ×2 IMPLANT
GOWN STRL REUS W/TWL LRG LVL3 (GOWN DISPOSABLE) ×4
LENS IOL ACRYSOF IQ TORIC 22.0 ×2 IMPLANT
LENS IOL IQ TORIC 3 22.0 ×1 IMPLANT
MARKER SKIN DUAL TIP RULER LAB (MISCELLANEOUS) ×3 IMPLANT
NEEDLE FILTER BLUNT 18X 1/2SAF (NEEDLE) ×4
NEEDLE FILTER BLUNT 18X1 1/2 (NEEDLE) ×2 IMPLANT
PACK CATARACT BRASINGTON (MISCELLANEOUS) ×3 IMPLANT
PACK EYE AFTER SURG (MISCELLANEOUS) ×3 IMPLANT
PACK OPTHALMIC (MISCELLANEOUS) ×3 IMPLANT
SYR 3ML LL SCALE MARK (SYRINGE) ×3 IMPLANT
SYR TB 1ML LUER SLIP (SYRINGE) ×3 IMPLANT
WATER STERILE IRR 500ML POUR (IV SOLUTION) ×3 IMPLANT
WIPE NON LINTING 3.25X3.25 (MISCELLANEOUS) ×3 IMPLANT

## 2019-01-14 NOTE — Anesthesia Preprocedure Evaluation (Signed)
Anesthesia Evaluation  Patient identified by MRN, date of birth, ID band Patient awake    Reviewed: Allergy & Precautions, H&P , NPO status , Patient's Chart, lab work & pertinent test results  Airway Mallampati: III  TM Distance: >3 FB Neck ROM: full    Dental no notable dental hx.    Pulmonary COPD, former smoker,    Pulmonary exam normal breath sounds clear to auscultation       Cardiovascular hypertension, Normal cardiovascular exam Rhythm:regular Rate:Normal     Neuro/Psych PSYCHIATRIC DISORDERS    GI/Hepatic GERD  ,  Endo/Other  diabetesHypothyroidism   Renal/GU      Musculoskeletal   Abdominal   Peds  Hematology   Anesthesia Other Findings   Reproductive/Obstetrics                             Anesthesia Physical Anesthesia Plan  ASA: III  Anesthesia Plan: MAC   Post-op Pain Management:    Induction:   PONV Risk Score and Plan: 2 and Midazolam, TIVA and Treatment may vary due to age or medical condition  Airway Management Planned:   Additional Equipment:   Intra-op Plan:   Post-operative Plan:   Informed Consent: I have reviewed the patients History and Physical, chart, labs and discussed the procedure including the risks, benefits and alternatives for the proposed anesthesia with the patient or authorized representative who has indicated his/her understanding and acceptance.       Plan Discussed with: CRNA  Anesthesia Plan Comments:         Anesthesia Quick Evaluation

## 2019-01-14 NOTE — H&P (Signed)

## 2019-01-14 NOTE — Anesthesia Postprocedure Evaluation (Signed)
Anesthesia Post Note  Patient: Natasha Vasquez  Procedure(s) Performed: CATARACT EXTRACTION PHACO AND INTRAOCULAR LENS PLACEMENT (IOC) LEFT TECNIS TORIC ADD 5.62 00:46.5 30.0% (Left Eye)     Patient location during evaluation: PACU Anesthesia Type: MAC Level of consciousness: awake and alert and oriented Pain management: satisfactory to patient Vital Signs Assessment: post-procedure vital signs reviewed and stable Respiratory status: spontaneous breathing, nonlabored ventilation and respiratory function stable Cardiovascular status: blood pressure returned to baseline and stable Postop Assessment: Adequate PO intake and No signs of nausea or vomiting Anesthetic complications: no    Raliegh Ip

## 2019-01-14 NOTE — Op Note (Signed)
LOCATION:  [place]   PREOPERATIVE DIAGNOSIS:  Nuclear sclerotic cataract of the left eye.  H25.12  POSTOPERATIVE DIAGNOSIS:  Nuclear sclerotic cataract of the left eye.   PROCEDURE:  Phacoemulsification with Toric posterior chamber intraocular lens placement of the left eye.  Ultrasound time: Procedure(s) with comments: CATARACT EXTRACTION PHACO AND INTRAOCULAR LENS PLACEMENT (IOC) LEFT TECNIS TORIC ADD 5.62 00:46.5 30.0% (Left) - diabetic - diet controlled LENS:[id number] 22.0 D Toric intraocular lens with 1.5 diopters of cylindrical power with axis orientation at 22 degrees.    SURGEON:  Wyonia Hough, MD   ANESTHESIA:  Topical with tetracaine drops and 2% Xylocaine jelly, augmented with 1% preservative-free intracameral lidocaine.  COMPLICATIONS:  None.   DESCRIPTION OF PROCEDURE:  The patient was identified in the holding room and transported to the operating suite and placed in the supine position under the operating microscope.  The left eye was identified as the operative eye, and it was prepped and draped in the usual sterile ophthalmic fashion.    A clear-corneal paracentesis incision was made at the 1:30 position.  0.5 ml of preservative-free 1% lidocaine was injected into the anterior chamber. The anterior chamber was filled with Viscoat.  A 2.4 millimeter near clear corneal incision was then made at the 10:30 position.  A cystotome and capsulorrhexis forceps were then used to make a curvilinear capsulorrhexis.  Hydrodissection and hydrodelineation were then performed using balanced salt solution.   Phacoemulsification was then used in stop and chop fashion to remove the lens, nucleus and epinucleus.  The remaining cortex was aspirated using the irrigation and aspiration handpiece.  Provisc viscoelastic was then placed into the capsular bag to distend it for lens placement.  The Verion digital marker was used to align the implant at the intended axis.   A 22.0  diopter lens was then injected into the capsular bag.  It was rotated clockwise until the axis marks on the lens were approximately 15 degrees in the counterclockwise direction to the intended alignment.  The viscoelastic was aspirated from the eye using the irrigation aspiration handpiece.  Then, a Koch spatula through the sideport incision was used to rotate the lens in a clockwise direction until the axis markings of the intraocular lens were lined up with the Verion alignment.  Balanced salt solution was then used to hydrate the wounds. Vigamox 0.2 ml of a 1mg  per ml solution was injected into the anterior chamber for a dose of 0.2 mg of intracameral antibiotic at the completion of the case.    The eye was noted to have a physiologic pressure and there was no wound leak noted.   Timolol and Brimonidine drops were applied to the eye.  The patient was taken to the recovery room in stable condition having had no complications of anesthesia or surgery.  Nik Gorrell 01/14/2019, 8:06 AM

## 2019-01-14 NOTE — Transfer of Care (Signed)
Immediate Anesthesia Transfer of Care Note  Patient: Natasha Vasquez  Procedure(s) Performed: CATARACT EXTRACTION PHACO AND INTRAOCULAR LENS PLACEMENT (IOC) LEFT TECNIS TORIC ADD 5.62 00:46.5 30.0% (Left Eye)  Patient Location: PACU  Anesthesia Type: MAC  Level of Consciousness: awake, alert  and patient cooperative  Airway and Oxygen Therapy: Patient Spontanous Breathing and Patient connected to supplemental oxygen  Post-op Assessment: Post-op Vital signs reviewed, Patient's Cardiovascular Status Stable, Respiratory Function Stable, Patent Airway and No signs of Nausea or vomiting  Post-op Vital Signs: Reviewed and stable  Complications: No apparent anesthesia complications

## 2019-01-14 NOTE — Anesthesia Procedure Notes (Signed)
Procedure Name: MAC Performed by: Waldemar Siegel, CRNA Pre-anesthesia Checklist: Patient identified, Emergency Drugs available, Suction available, Timeout performed and Patient being monitored Patient Re-evaluated:Patient Re-evaluated prior to induction Oxygen Delivery Method: Nasal cannula Placement Confirmation: positive ETCO2       

## 2019-01-19 ENCOUNTER — Ambulatory Visit (HOSPITAL_COMMUNITY): Payer: Medicare PPO | Attending: Internal Medicine

## 2019-01-19 ENCOUNTER — Other Ambulatory Visit: Payer: Self-pay

## 2019-01-19 DIAGNOSIS — I712 Thoracic aortic aneurysm, without rupture, unspecified: Secondary | ICD-10-CM

## 2019-01-22 DIAGNOSIS — H2511 Age-related nuclear cataract, right eye: Secondary | ICD-10-CM | POA: Diagnosis not present

## 2019-01-28 NOTE — Progress Notes (Signed)
[address]       [place] [address]             [phone]                    Natasha Vasquez [facility] Medical Record [id number] Date of Birth: Sep 06, 1940  Referring: Natasha Lewandowsky, MD Primary Care: Natasha Crews, MD Primary Cardiologist: No primary care provider on file.  Chief Complaint:    Chief Complaint  Patient presents with  . Thoracic Aortic Aneurysm    had CT CHEST 10/17/18 per Natasha Vasquez, M.D....ECHO 01/19/19    History of Present Illness:    Natasha Vasquez 79 y.o. female is seen in the office  today for incidental finding of dilated ascending aorta.  The patient had a CT scan in December 2019 because of respiratory symptoms, a follow-up scan was done in June and noted dilated ascending aorta.  Neither of the scans were with contrast.  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 )  The patient has no previous cardiac history, she does have a history of diet-controlled diabetes and hypertension.  There is no family history of aortic dissection or aneurysm.  She notes her father died at age 77, had sudden death, brother died of lung cancer at age 51.  Patient had an echocardiogram done last week to evaluate her aortic valve specifically if it was bicuspid or trileaflet,  Current Activity/ Functional Status:  Patient is independent with mobility/ambulation, transfers, ADL's, IADL's.   Zubrod Score: At the time of surgery this patient's most appropriate activity status/level should be described as: []     0    Normal activity, no symptoms [x]     1    Restricted in physical strenuous activity but ambulatory, able to do out light work []     2    Ambulatory and capable of self  care, unable to do work activities, up and about               >50 % of waking hours                              []     3    Only limited self care, in bed greater than 50% of waking hours []     4    Completely disabled, no self care, confined to bed or chair []     5    Moribund   Past Medical History:  Diagnosis Date  . Anemia in pregnancy   . Arthritis    hands  . Barrett esophagus   . 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  ER/PR NEGATIVE  . Chronic cough   . Complication of anesthesia    20 years ago pts b/p dropped really low, "coded"  . Depression   . Depression   . Diabetes mellitus without complication (HCC)    diet controlled  . Family history of adverse reaction to anesthesia    son PONV  . GERD (gastroesophageal reflux disease)   . Hypercholesteremia   . Hyperlipidemia   .  Hypertension   . Melanoma ([place])   . MVP (mitral valve prolapse)    followed by PCP  . Nephritis   . Nephritis   . S/P appendectomy   . Thoracic aortic aneurysm ([place])   . Torn meniscus    left    Past Surgical History:  Procedure Laterality Date  . ABDOMINAL HYSTERECTOMY    . APPENDECTOMY    . BREAST BIOPSY Right 04/25/2017   retroareolar 10:00   wing clip   path pending  . BREAST BIOPSY Right 04/25/2017   10:00  [id number]  venus clip    path pending  . BREAST BIOPSY Right 05/03/2017   Affirm Bx- path pending  . BREAST CYST ASPIRATION Bilateral    NEG  . BREAST EXCISIONAL BIOPSY Left 20+ yrs ago   NEG  . BREAST RECONSTRUCTION WITH PLACEMENT OF TISSUE EXPANDER AND FLEX HD (ACELLULAR HYDRATED DERMIS) Right 06/03/2017   Procedure: BREAST RECONSTRUCTION WITH PLACEMENT OF TISSUE EXPANDER AND FLEX HD (ACELLULAR HYDRATED DERMIS);  Surgeon: Wallace Going, DO;  Location: [facility];  Service: Plastics;  Laterality: Right;  . BREAST REDUCTION SURGERY Left 11/06/2017   Procedure: BREAST REDUCTION;  Surgeon: Wallace Going, DO;  Location: [facility];  Service:  Plastics;  Laterality: Left;  . BROW LIFT Bilateral 02/01/2015   Procedure: BLEPHAROPLASTY bilateral upper eyelids.;  Surgeon: Karle Starch, MD;  Location: [place];  Service: Ophthalmology;  Laterality: Bilateral;  DIABETIC - diet controlled  . CATARACT EXTRACTION W/PHACO Left 01/14/2019   Procedure: CATARACT EXTRACTION PHACO AND INTRAOCULAR LENS PLACEMENT (IOC) LEFT TECNIS TORIC ADD 5.62 00:46.5 30.0%;  Surgeon: Leandrew Koyanagi, MD;  Location: [place];  Service: Ophthalmology;  Laterality: Left;  diabetic - diet controlled  . CHOLECYSTECTOMY    . COLONOSCOPY    . COLONOSCOPY WITH PROPOFOL N/A 05/11/2015   Procedure: COLONOSCOPY WITH PROPOFOL;  Surgeon: Manya Silvas, MD;  Location: [facility];  Service: Endoscopy;  Laterality: N/A;  . ESOPHAGOGASTRODUODENOSCOPY    . ESOPHAGOGASTRODUODENOSCOPY (EGD) WITH PROPOFOL  05/11/2015   Procedure: ESOPHAGOGASTRODUODENOSCOPY (EGD) WITH PROPOFOL;  Surgeon: Manya Silvas, MD;  Location: [facility] ENDOSCOPY;  Service: Endoscopy;;  . FRACTURE SURGERY Right    arm and shoulder  . JOINT REPLACEMENT    . KNEE ARTHROSCOPY Left 03/14/2016   Procedure: ARTHROSCOPY KNEE, PARTIAL MEDIAL MENISECTOMY, CHONDROPLASTY;  Surgeon: Dereck Leep, MD;  Location: [facility];  Service: Orthopedics;  Laterality: Left;  Marland Kitchen MASTECTOMY Right 2019  . MASTECTOMY W/ SENTINEL NODE BIOPSY Right 06/03/2017   Procedure: MASTECTOMY WITH SENTINEL LYMPH NODE BIOPSY;  Surgeon: Robert Bellow, MD;  Location: [facility];  Service: General;  Laterality: Right;  . PARTIAL HIP ARTHROPLASTY Right   . REDUCTION MAMMAPLASTY Left 2019  . REMOVAL OF BILATERAL TISSUE EXPANDERS WITH PLACEMENT OF BILATERAL BREAST IMPLANTS Right 11/06/2017   Procedure: REMOVAL OF TISSUE EXPANDER WITH PLACEMENT OF BREAST IMPLANT;  Surgeon: Wallace Going, DO;  Location: [facility];  Service: Plastics;  Laterality: Right;  . TONSILLECTOMY      Family History  Problem Relation Age of  Onset  . Cancer Mother        colon/rectal  . Colon cancer Mother   . Heart disease Father   . Hypertension Father   . Atrial fibrillation Father   . Diabetes Father   . Cancer Brother        lung  . Healthy Son   . Healthy Son   . Breast cancer Neg Hx  Social History   Tobacco Use  Smoking Status Former Smoker  . Types: Cigarettes  . Quit date: 51  . Years since quitting: 56.0  Smokeless Tobacco Never Used  Tobacco Comment   smoked in college    Social History   Substance and Sexual Activity  Alcohol Use Yes  . Alcohol/week: 7.0 - 14.0 standard drinks  . Types: 7 - 14 Glasses of wine per week   Comment: 1-2 glasses of wine per night     Allergies  Allergen Reactions  . Codeine Other (See Comments)    Chest pain  . Lorazepam Other (See Comments)    "Feels loopy"  . Oxycodone Other (See Comments)    MENTAL CHANGES  . Paregoric     Chest pain---tolerated MORPHINE  . Penicillins Hives    Has patient had a PCN reaction causing immediate rash, facial/tongue/throat swelling, SOB or lightheadedness with hypotension: Yes Has patient had a PCN reaction causing severe rash involving mucus membranes or skin necrosis: No Has patient had a PCN reaction that required hospitalization: NO Has patient had a PCN reaction occurring within the last 10 years: No If all of the above answers are "NO", then may proceed with Cephalosporin use.  Can take Augmentin  . Quinolones     PATIENT UNAWARE OF allergy to this class of antibiotics, only aware of allergy to [facility] Patient was warned about not using Cipro and similar antibiotics. Recent studies have raised concern that fluoroquinolone antibiotics could be associated with an increased risk of aortic aneurysm Fluoroquinolones have non-antimicrobial properties that might jeopardise the integrity of the extracellular matrix of the vascular wall In a  propensity score matched cohort study in [place], there was a 66% increased rate   . Aleve [Naproxen Sodium] Hives, Rash and Other (See Comments)    headaches    Current Outpatient Medications  Medication Sig Dispense Refill  . acetaminophen (TYLENOL) 500 MG tablet Take 500 mg by mouth every 6 (six) hours as needed.    Marland Kitchen albuterol (PROVENTIL HFA;VENTOLIN HFA) 108 (90 Base) MCG/ACT inhaler Inhale 2 puffs into the lungs every 6 (six) hours as needed for wheezing or shortness of breath. 1 Inhaler 2  . ARIPiprazole (ABILIFY) 2 MG tablet Take 1 tablet (2 mg total) by mouth every evening. 90 tablet 2  . aspirin 81 MG tablet Take 81 mg by mouth daily.    Marland Kitchen atorvastatin (LIPITOR) 40 MG tablet Take 1 tablet (40 mg total) by mouth daily. 90 tablet 3  . bismuth subsalicylate (PEPTO BISMOL) 262 MG chewable tablet Chew 262 mg by mouth as needed. **capsule**    . calcium carbonate (TUMS - DOSED IN MG ELEMENTAL CALCIUM) 500 MG chewable tablet Chew 1 tablet by mouth as needed for indigestion or heartburn.    . ferrous sulfate 324 MG TBEC Take 324 mg by mouth daily with breakfast.    . lisinopril (ZESTRIL) 5 MG tablet TAKE 1 TABLET BY MOUTH EVERY DAY 90 tablet 1  . Multiple Vitamins-Minerals (CENTRUM SILVER PO) Take 1 tablet by mouth daily.    Marland Kitchen omeprazole (PRILOSEC) 40 MG capsule Take 40 mg by mouth 2 (two) times daily.     Marland Kitchen oxybutynin (DITROPAN-XL) 10 MG 24 hr tablet TAKE 1 TABLET BY MOUTH EVERYDAY AT BEDTIME 90 tablet 1  . tiotropium (SPIRIVA) 18 MCG inhalation capsule Place 1 capsule (18 mcg total) into inhaler and inhale daily. 90 capsule 3  . Venlafaxine HCl 225 MG TB24 TAKE 1 TABLET (225 MG TOTAL)  BY MOUTH DAILY. 90 tablet 2   No current facility-administered medications for this visit.    Pertinent items are noted in HPI.   Review of Systems:     Cardiac Review of Systems: [Y] = yes  or   [ N ] = no   Chest Pain [n    ]  Resting SOB [ n  ] Exertional SOB  [ n ]  Orthopnea [n  ]   Pedal Edema [ n  ]    Palpitations [ n ] Syncope  [ n ]   Presyncope [ n  ]   General Review  of Systems: [Y] = yes [  ]=no Constitional: recent weight change [  ];  Wt loss over the last 3 months [   ] anorexia [  ]; fatigue [  ]; nausea [  ]; night sweats [  ]; fever [  ]; or chills [  ];           Eye : blurred vision [  ]; diplopia [   ]; vision changes [  ];  Amaurosis fugax[  ]; Resp: cough [  ];  wheezing[  ];  hemoptysis[  ]; shortness of breath[  ]; paroxysmal nocturnal dyspnea[  ]; dyspnea on exertion[  ]; or orthopnea[  ];  GI:  gallstones[  ], vomiting[  ];  dysphagia[  ]; melena[  ];  hematochezia [  ]; heartburn[  ];   Hx of  Colonoscopy[  ]; GU: kidney stones [  ]; hematuria[  ];   dysuria [  ];  nocturia[  ];  history of     obstruction [  ]; urinary frequency [  ]             Skin: rash, swelling[  ];, hair loss[  ];  peripheral edema[  ];  or itching[  ]; Musculosketetal: myalgias[  ];  joint swelling[  ];  joint erythema[  ];  joint pain[  ];  back pain[  ];  Heme/Lymph: bruising[  ];  bleeding[  ];  anemia[  ];  Neuro: TIA[  ];  headaches[  ];  stroke[  ];  vertigo[  ];  seizures[  ];   paresthesias[  ];  difficulty walking[  ];  Psych:depression[  ]; anxiety[  ];  Endocrine: diabetes[  ];  thyroid dysfunction[  ];  Immunizations: Flu up to date [  ]; Pneumococcal up to date [  ];  Other:     PHYSICAL EXAMINATION: BP 129/84 (BP Location: Left Arm, Patient Position: Sitting, Cuff Size: Normal)   Pulse 82   Temp 97.7 F (36.5 C)   Resp 16   Ht 5' 5.5" (1.664 m)   Wt 82.6 kg   SpO2 92% Comment: ON RA  BMI 29.83 kg/m  General appearance: alert, cooperative and no distress Head: Normocephalic, without obvious abnormality, atraumatic Neck: no adenopathy, no carotid bruit, no JVD, supple, symmetrical, trachea midline and thyroid not enlarged, symmetric, no tenderness/mass/nodules Lymph nodes: Cervical, supraclavicular, and axillary nodes normal. Resp: clear to auscultation bilaterally Cardio: regular rate and rhythm, S1, S2 normal, no murmur, click, rub or  gallop GI: soft, non-tender; bowel sounds normal; no masses,  no organomegaly Extremities: extremities normal, atraumatic, no cyanosis or edema and Homans sign is negative, no sign of DVT Neurologic: Grossly normal   Diagnostic Studies & Laboratory data:     Recent Radiology Findings:   CLINICAL DATA:  Pulmonary nodules.  EXAM: CT CHEST WITHOUT  CONTRAST  TECHNIQUE: Multidetector CT imaging of the chest was performed following the standard protocol without IV contrast.  COMPARISON:  01/23/2018.  FINDINGS: Cardiovascular: There is aneurysmal dilatation of the ascending aorta, currently measuring 4.6 cm. The heart size is normal. Coronary artery calcifications and thoracic aortic calcifications are noted.  Mediastinum/Nodes: No enlarged mediastinal or axillary lymph nodes. Thyroid gland, trachea, and esophagus demonstrate no significant findings.  Lungs/Pleura: There are a few small 3-4 mm pulmonary nodules in the right upper lobe which are stable from prior study (axial series 3, image 45). There is a stable branching opacity in the right middle lobe, consistent with mucous plugging. There is a stable 4 mm pulmonary nodule in the left peripheral upper lobe. There is improved aeration at the left lung base with near complete resolution of the previously demonstrated area of consolidation. There are few scattered calcified granulomas bilaterally.  Upper Abdomen: There is a small hiatal hernia. The patient appears to be status post cholecystectomy. There is stable dilatation of the common bile duct.  Musculoskeletal: A right-sided breast implant is noted. There is diffuse skin thickening over the right breast implant. This appears stable from prior study. There is a stable nodular opacity in the left breast.  IMPRESSION: 1. Near complete resolution of the previously demonstrated opacity at the left lung base, consistent with a benign etiology. 2. Stable bilateral  pulmonary nodules measuring up to approximately 3-4 mm. These can be evaluated on subsequent follow-up studies for the patient's thoracic aortic aneurysm detailed below. 3. Ascending thoracic aortic aneurysm measuring approximately 4.6 cm in diameter. Ascending thoracic aortic aneurysm. Recommend semi-annual imaging followup by CTA or MRA and referral to cardiothoracic surgery if not already obtained. This recommendation follows 2010 ACCF/AHA/AATS/ACR/ASA/SCA/SCAI/SIR/STS/SVM Guidelines for the Diagnosis and Management of Patients With Thoracic Aortic Disease. Circulation. 2010; [id number]. Aortic aneurysm NOS (ICD10-I71.9) 4. Unchanged skin thickening overlying the patient's right breast implant. Stable left breast nodular opacities are again noted. Correlation with recent mammography is recommended.  Aortic Atherosclerosis (ICD10-I70.0).   Electronically Signed   By: Constance Holster M.D.   On: 06/27/2018 21:36 I have independently reviewed the above radiology studies  and reviewed the findings with the patient.   Recent Lab Findings: Lab Results  Component Value Date   WBC 6.3 12/01/2018   HGB 12.0 12/01/2018   HCT 38.2 12/01/2018   PLT 310 12/01/2018   GLUCOSE 76 09/17/2018   CHOL 179 09/17/2018   TRIG 256 (H) 09/17/2018   HDL 42 09/17/2018   LDLCALC 94 09/17/2018   ALT 14 09/17/2018   AST 17 09/17/2018   NA 140 09/17/2018   K 4.3 09/17/2018   CL 103 09/17/2018   CREATININE 0.91 09/17/2018   BUN 13 09/17/2018   CO2 23 09/17/2018   TSH 4.190 09/17/2018   HGBA1C 6.1 (H) 09/17/2018   Aortic Size Index=     4.4    /Body surface area is 1.95 meters squared. = 2.29  < 2.75 cm/m2      4% risk per year 2.75 to 4.25          8% risk per year > 4.25 cm/m2    20% risk per year     Assessment / Plan:   #1 patient with dilated ascending aorta approximately 4.4 cm, without aortic stenosis or aortic insufficiency on physical exam, echocardiogram confirms a  trileaflet aortic valve without significant valvular disease mitral or aortic.   We will plan follow-up CTA of the chest in 6  months.  Grace Isaac MD      [address] [place] [address] Office [phone]   Beeper [phone]  01/29/2019 1:43 PM

## 2019-01-29 ENCOUNTER — Other Ambulatory Visit: Payer: Self-pay | Admitting: Family Medicine

## 2019-01-29 ENCOUNTER — Ambulatory Visit: Payer: Medicare PPO | Admitting: Cardiothoracic Surgery

## 2019-01-29 ENCOUNTER — Encounter: Payer: Self-pay | Admitting: Cardiothoracic Surgery

## 2019-01-29 ENCOUNTER — Other Ambulatory Visit: Payer: Self-pay

## 2019-01-29 VITALS — BP 129/84 | HR 82 | Temp 97.7°F | Resp 16 | Ht 65.5 in | Wt 182.0 lb

## 2019-01-29 DIAGNOSIS — I712 Thoracic aortic aneurysm, without rupture, unspecified: Secondary | ICD-10-CM

## 2019-01-29 DIAGNOSIS — N3281 Overactive bladder: Secondary | ICD-10-CM

## 2019-01-29 NOTE — Patient Instructions (Signed)

## 2019-02-03 ENCOUNTER — Other Ambulatory Visit: Payer: Self-pay

## 2019-02-03 ENCOUNTER — Encounter: Payer: Self-pay | Admitting: Ophthalmology

## 2019-02-09 ENCOUNTER — Other Ambulatory Visit: Payer: Self-pay

## 2019-02-09 ENCOUNTER — Other Ambulatory Visit
Admission: RE | Admit: 2019-02-09 | Discharge: 2019-02-09 | Disposition: A | Discharge status: Home / Self Care | Payer: Medicare PPO | Source: Ambulatory Visit | Attending: Ophthalmology | Admitting: Ophthalmology

## 2019-02-09 DIAGNOSIS — L309 Dermatitis, unspecified: Secondary | ICD-10-CM | POA: Diagnosis not present

## 2019-02-09 DIAGNOSIS — Z01812 Encounter for preprocedural laboratory examination: Secondary | ICD-10-CM | POA: Insufficient documentation

## 2019-02-09 DIAGNOSIS — L298 Other pruritus: Secondary | ICD-10-CM | POA: Diagnosis not present

## 2019-02-09 DIAGNOSIS — Z85820 Personal history of malignant melanoma of skin: Secondary | ICD-10-CM | POA: Diagnosis not present

## 2019-02-09 DIAGNOSIS — D225 Melanocytic nevi of trunk: Secondary | ICD-10-CM | POA: Diagnosis not present

## 2019-02-09 DIAGNOSIS — B078 Other viral warts: Secondary | ICD-10-CM | POA: Diagnosis not present

## 2019-02-09 DIAGNOSIS — Z20822 Contact with and (suspected) exposure to covid-19: Secondary | ICD-10-CM | POA: Insufficient documentation

## 2019-02-09 DIAGNOSIS — D485 Neoplasm of uncertain behavior of skin: Secondary | ICD-10-CM | POA: Diagnosis not present

## 2019-02-09 DIAGNOSIS — D2271 Melanocytic nevi of right lower limb, including hip: Secondary | ICD-10-CM | POA: Diagnosis not present

## 2019-02-09 DIAGNOSIS — Z85828 Personal history of other malignant neoplasm of skin: Secondary | ICD-10-CM | POA: Diagnosis not present

## 2019-02-09 LAB — SARS CORONAVIRUS 2 (TAT 6-24 HRS): SARS Coronavirus 2: NEGATIVE

## 2019-02-09 NOTE — Discharge Instructions (Signed)

## 2019-02-11 ENCOUNTER — Encounter: Payer: Self-pay | Admitting: Ophthalmology

## 2019-02-11 ENCOUNTER — Ambulatory Visit: Payer: Medicare PPO | Admitting: Anesthesiology

## 2019-02-11 ENCOUNTER — Other Ambulatory Visit: Payer: Self-pay

## 2019-02-11 ENCOUNTER — Ambulatory Visit
Admission: RE | Admit: 2019-02-11 | Discharge: 2019-02-11 | Disposition: A | Discharge status: Home / Self Care | Payer: Medicare PPO | Attending: Ophthalmology | Admitting: Ophthalmology

## 2019-02-11 ENCOUNTER — Encounter
Admission: RE | Disposition: A | Discharge status: Home / Self Care | Payer: Self-pay | Source: Home / Self Care | Attending: Ophthalmology

## 2019-02-11 DIAGNOSIS — H25811 Combined forms of age-related cataract, right eye: Secondary | ICD-10-CM | POA: Diagnosis not present

## 2019-02-11 DIAGNOSIS — J449 Chronic obstructive pulmonary disease, unspecified: Secondary | ICD-10-CM | POA: Insufficient documentation

## 2019-02-11 DIAGNOSIS — Z87891 Personal history of nicotine dependence: Secondary | ICD-10-CM | POA: Diagnosis not present

## 2019-02-11 DIAGNOSIS — I10 Essential (primary) hypertension: Secondary | ICD-10-CM | POA: Insufficient documentation

## 2019-02-11 DIAGNOSIS — Z96641 Presence of right artificial hip joint: Secondary | ICD-10-CM | POA: Insufficient documentation

## 2019-02-11 DIAGNOSIS — F329 Major depressive disorder, single episode, unspecified: Secondary | ICD-10-CM | POA: Insufficient documentation

## 2019-02-11 DIAGNOSIS — H2511 Age-related nuclear cataract, right eye: Secondary | ICD-10-CM | POA: Insufficient documentation

## 2019-02-11 DIAGNOSIS — Z7982 Long term (current) use of aspirin: Secondary | ICD-10-CM | POA: Insufficient documentation

## 2019-02-11 DIAGNOSIS — E1136 Type 2 diabetes mellitus with diabetic cataract: Secondary | ICD-10-CM | POA: Insufficient documentation

## 2019-02-11 DIAGNOSIS — Z79899 Other long term (current) drug therapy: Secondary | ICD-10-CM | POA: Insufficient documentation

## 2019-02-11 DIAGNOSIS — Z853 Personal history of malignant neoplasm of breast: Secondary | ICD-10-CM | POA: Diagnosis not present

## 2019-02-11 DIAGNOSIS — F410 Panic disorder [episodic paroxysmal anxiety] without agoraphobia: Secondary | ICD-10-CM | POA: Insufficient documentation

## 2019-02-11 DIAGNOSIS — Z85820 Personal history of malignant melanoma of skin: Secondary | ICD-10-CM | POA: Diagnosis not present

## 2019-02-11 DIAGNOSIS — E7800 Pure hypercholesterolemia, unspecified: Secondary | ICD-10-CM | POA: Insufficient documentation

## 2019-02-11 DIAGNOSIS — K219 Gastro-esophageal reflux disease without esophagitis: Secondary | ICD-10-CM | POA: Insufficient documentation

## 2019-02-11 DIAGNOSIS — I719 Aortic aneurysm of unspecified site, without rupture: Secondary | ICD-10-CM | POA: Insufficient documentation

## 2019-02-11 DIAGNOSIS — D649 Anemia, unspecified: Secondary | ICD-10-CM | POA: Insufficient documentation

## 2019-02-11 HISTORY — PX: CATARACT EXTRACTION W/PHACO: SHX586

## 2019-02-11 SURGERY — PHACOEMULSIFICATION, CATARACT, WITH IOL INSERTION
Anesthesia: Monitor Anesthesia Care | Site: Eye | Laterality: Right

## 2019-02-11 MED ORDER — ARMC OPHTHALMIC DILATING DROPS
1.0000 "application " | OPHTHALMIC | Status: DC | PRN
  Administered 2019-02-11 (×3): 1 via OPHTHALMIC

## 2019-02-11 MED ORDER — ACETAMINOPHEN 325 MG PO TABS: 325.0000 mg | ORAL_TABLET | ORAL | Status: DC | PRN

## 2019-02-11 MED ORDER — TETRACAINE HCL 0.5 % OP SOLN
1.0000 [drp] | OPHTHALMIC | Status: DC | PRN
  Administered 2019-02-11 (×3): 1 [drp] via OPHTHALMIC

## 2019-02-11 MED ORDER — FENTANYL CITRATE (PF) 100 MCG/2ML IJ SOLN
INTRAMUSCULAR | Status: DC | PRN
  Administered 2019-02-11: 100 ug via INTRAVENOUS

## 2019-02-11 MED ORDER — MOXIFLOXACIN HCL 0.5 % OP SOLN
1.0000 [drp] | OPHTHALMIC | Status: DC | PRN
  Administered 2019-02-11 (×3): 1 [drp] via OPHTHALMIC

## 2019-02-11 MED ORDER — ONDANSETRON HCL 4 MG/2ML IJ SOLN: 4.0000 mg | Freq: Once | INTRAMUSCULAR | Status: DC | PRN

## 2019-02-11 MED ORDER — EPINEPHRINE PF 1 MG/ML IJ SOLN
INTRAOCULAR | Status: DC | PRN
  Administered 2019-02-11: 61 mL via OPHTHALMIC

## 2019-02-11 MED ORDER — LIDOCAINE HCL (PF) 2 % IJ SOLN
INTRAOCULAR | Status: DC | PRN
  Administered 2019-02-11: 2 mL

## 2019-02-11 MED ORDER — MIDAZOLAM HCL 2 MG/2ML IJ SOLN
INTRAMUSCULAR | Status: DC | PRN
  Administered 2019-02-11: 2 mg via INTRAVENOUS

## 2019-02-11 MED ORDER — MOXIFLOXACIN HCL 0.5 % OP SOLN
OPHTHALMIC | Status: DC | PRN
  Administered 2019-02-11: 0.2 mL via OPHTHALMIC

## 2019-02-11 MED ORDER — LACTATED RINGERS IV SOLN: 100.0000 mL/h | INTRAVENOUS | Status: DC

## 2019-02-11 MED ORDER — ACETAMINOPHEN 160 MG/5ML PO SOLN: 325.0000 mg | ORAL | Status: DC | PRN

## 2019-02-11 MED ORDER — BRIMONIDINE TARTRATE-TIMOLOL 0.2-0.5 % OP SOLN
OPHTHALMIC | Status: DC | PRN
  Administered 2019-02-11: 1 [drp] via OPHTHALMIC

## 2019-02-11 MED ORDER — NA HYALUR & NA CHOND-NA HYALUR 0.4-0.35 ML IO KIT
PACK | INTRAOCULAR | Status: DC | PRN
  Administered 2019-02-11: 1 mL via INTRAOCULAR

## 2019-02-11 SURGICAL SUPPLY — 24 items
CANNULA ANT/CHMB 27GA (MISCELLANEOUS) ×3 IMPLANT
GLOVE SURG LX 7.5 STRW (GLOVE) ×2
GLOVE SURG LX STRL 7.5 STRW (GLOVE) ×1 IMPLANT
GLOVE SURG TRIUMPH 8.0 PF LTX (GLOVE) ×3 IMPLANT
GOWN STRL REUS W/ TWL LRG LVL3 (GOWN DISPOSABLE) ×2 IMPLANT
GOWN STRL REUS W/TWL LRG LVL3 (GOWN DISPOSABLE) ×4
KIT SLEEVE INFUSION .9 MICRO (MISCELLANEOUS) ×3 IMPLANT
LENS IOL TECNIS ITEC 22.0 (Intraocular Lens) ×3 IMPLANT
MARKER SKIN DUAL TIP RULER LAB (MISCELLANEOUS) ×3 IMPLANT
NEEDLE CAPSULORHEX 25GA (NEEDLE) ×6 IMPLANT
NEEDLE FILTER BLUNT 18X 1/2SAF (NEEDLE) ×4
NEEDLE FILTER BLUNT 18X1 1/2 (NEEDLE) ×2 IMPLANT
PACK CATARACT BRASINGTON (MISCELLANEOUS) ×3 IMPLANT
PACK EYE AFTER SURG (MISCELLANEOUS) ×3 IMPLANT
PACK OPTHALMIC (MISCELLANEOUS) ×3 IMPLANT
SOL BAL SALT 15ML (MISCELLANEOUS) ×3
SOLUTION BAL SALT 15ML (MISCELLANEOUS) ×1 IMPLANT
SUT ETHILON 10-0 CS-B-6CS-B-6 (SUTURE) ×3
SUTURE EHLN 10-0 CS-B-6CS-B-6 (SUTURE) ×1 IMPLANT
SYR 3ML LL SCALE MARK (SYRINGE) ×6 IMPLANT
SYR TB 1ML LUER SLIP (SYRINGE) ×3 IMPLANT
WATER STERILE IRR 250ML POUR (IV SOLUTION) ×3 IMPLANT
WICK EYE OCUCEL (MISCELLANEOUS) ×3 IMPLANT
WIPE NON LINTING 3.25X3.25 (MISCELLANEOUS) ×3 IMPLANT

## 2019-02-11 NOTE — Transfer of Care (Signed)
Immediate Anesthesia Transfer of Care Note  Patient: Natasha Vasquez  Procedure(s) Performed: CATARACT EXTRACTION PHACO AND INTRAOCULAR LENS PLACEMENT (IOC) RIGHT DIABETIC 4.82 00:46.9 10.3% (Right Eye)  Patient Location: PACU  Anesthesia Type: MAC  Level of Consciousness: awake, alert  and patient cooperative  Airway and Oxygen Therapy: Patient Spontanous Breathing and Patient connected to supplemental oxygen  Post-op Assessment: Post-op Vital signs reviewed, Patient's Cardiovascular Status Stable, Respiratory Function Stable, Patent Airway and No signs of Nausea or vomiting  Post-op Vital Signs: Reviewed and stable  Complications: No apparent anesthesia complications

## 2019-02-11 NOTE — Anesthesia Preprocedure Evaluation (Signed)
Anesthesia Evaluation  Patient identified by MRN, date of birth, ID band Patient awake    Reviewed: Allergy & Precautions, H&P , NPO status , Patient's Chart, lab work & pertinent test results  History of Anesthesia Complications (+) Family history of anesthesia reaction and history of anesthetic complications  Airway Mallampati: III  TM Distance: >3 FB Neck ROM: full    Dental no notable dental hx.    Pulmonary COPD, former smoker,    Pulmonary exam normal breath sounds clear to auscultation       Cardiovascular Exercise Tolerance: Good hypertension, Normal cardiovascular exam Rhythm:regular Rate:Normal     Neuro/Psych PSYCHIATRIC DISORDERS Depression  Neuromuscular disease    GI/Hepatic GERD  ,  Endo/Other  diabetesHypothyroidism   Renal/GU Renal disease     Musculoskeletal  (+) Arthritis ,   Abdominal Normal abdominal exam  (+) - obese,   Peds  Hematology  (+) Blood dyscrasia, anemia ,   Anesthesia Other Findings   Reproductive/Obstetrics                             Anesthesia Physical  Anesthesia Plan  ASA: III  Anesthesia Plan: MAC   Post-op Pain Management:    Induction:   PONV Risk Score and Plan: 2 and Midazolam, TIVA and Treatment may vary due to age or medical condition  Airway Management Planned: Nasal Cannula  Additional Equipment:   Intra-op Plan:   Post-operative Plan:   Informed Consent: I have reviewed the patients History and Physical, chart, labs and discussed the procedure including the risks, benefits and alternatives for the proposed anesthesia with the patient or authorized representative who has indicated his/her understanding and acceptance.       Plan Discussed with: CRNA and Anesthesiologist  Anesthesia Plan Comments:         Anesthesia Quick Evaluation  Patient Active Problem List   Diagnosis Date Noted  . Iron deficiency  10/07/2018  . Osteoarthritis of left knee 09/17/2018  . Emphysema lung ([place]) 03/14/2018  . Diabetic peripheral neuropathy associated with type 2 diabetes mellitus ([place]) 03/14/2018  . Metatarsalgia of both feet 03/14/2018  . Swelling of first metatarsophalangeal (MTP) joint 03/14/2018  . Abnormality of lung 03/14/2018  . Acquired absence of right breast 06/11/2017  . 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[place]) 05/09/2017  . 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 Dysphagia 03/11/2017  . Obesity 03/11/2017  . Subclinical hypothyroidism 06/08/2015  . Osteopenia 06/08/2015  . Billowing mitral valve 06/08/2015  . Melanoma in situ ([place]) 06/08/2015  . Cramps of lower extremity 06/08/2015  . Arthritis 06/08/2015  . Microcytic anemia 06/08/2015  . Type 2 diabetes mellitus ([place]) 05/18/2015  . Colitis, enteritis, and gastroenteritis of presumed infectious origin 05/18/2015  . Closed fracture of neck of right femur ([place]) 03/25/2015  . Clinical depression 01/26/2015  . Hypercholesterolemia 10/28/2014  . Essential hypertension 09/27/2014  . GERD (gastroesophageal reflux disease) 08/06/2014  . Urinary frequency 07/16/2014  . Bell palsy 04/24/2004    CBC Latest Ref Rng & Units 12/01/2018 09/23/2018 09/17/2018  WBC 4.0 - 10.5 K/uL 6.3 8.9 7.7  Hemoglobin 12.0 - 15.0 g/dL 12.0 11.2(L) 10.4(L)  Hematocrit 36.0 - 46.0 % 38.2 36.2 33.0(L)  Platelets 150 - 400 K/uL 310 381 343   BMP Latest Ref Rng & Units 09/17/2018 03/14/2018 09/12/2017  Glucose 65 - 99 mg/dL 76 84 92  BUN 8 - 27 mg/dL  _0 Creatinine 0.57 - 1.00 mg/dL 0.91 0.81 0.83  BUN/Creat Ratio 12 - _1 Sodium 134 - 144 mmol/L 140 142 144  Potassium 3.5 - 5.2 mmol/L 4.3 4.3 4.4  Chloride 96 - 106 mmol/L 103 103 104  CO2 20 - 29 mmol/L _2 Calcium 8.7 - 10.3 mg/dL 9.3 9.4 9.3    Risks and benefits of anesthesia discussed at length, patient or surrogate demonstrates understanding.  Appropriately NPO. Plan to proceed with anesthesia.  Champ Mungo, MD 02/11/19

## 2019-02-11 NOTE — Op Note (Signed)
LOCATION:  [place]   PREOPERATIVE DIAGNOSIS:    Nuclear sclerotic cataract right eye. H25.11   POSTOPERATIVE DIAGNOSIS:  Nuclear sclerotic cataract right eye.     PROCEDURE:  Phacoemusification with posterior chamber intraocular lens placement of the right eye   ULTRASOUND TIME: Procedure(s) with comments: CATARACT EXTRACTION PHACO AND INTRAOCULAR LENS PLACEMENT (IOC) RIGHT DIABETIC 4.82 00:46.9 10.3% (Right) - Diabetic - diet controlled  LENS:   Implant Name Type Inv. Item Serial No. Manufacturer Lot No. LRB No. Used Action  LENS IOL DIOP 22.0 - [id number] Intraocular Lens LENS IOL DIOP 22.0 [id number]  Right 1 Implanted         SURGEON:  Wyonia Hough, MD   ANESTHESIA:  Topical with tetracaine drops and 2% Xylocaine jelly, augmented with 1% preservative-free intracameral lidocaine.    COMPLICATIONS:  iris prolapse requiring reposition and suturing of incisioon   DESCRIPTION OF PROCEDURE:  The patient was identified in the holding room and transported to the operating room and placed in the supine position under the operating microscope.  The right eye was identified as the operative eye and it was prepped and draped in the usual sterile ophthalmic fashion.   A 1 millimeter clear-corneal paracentesis was made at the 12:00 position.  0.5 ml of preservative-free 1% lidocaine was injected into the anterior chamber. The anterior chamber was filled with Viscoat viscoelastic.  A 2.4 millimeter keratome was used to make a near-clear corneal incision at the 9:00 position.  A curvilinear capsulorrhexis was made with a cystotome and capsulorrhexis forceps.  Balanced salt solution was used to hydrodissect and hydrodelineate the nucleus.   Phacoemulsification was then used in stop and chop fashion to remove the lens nucleus and epinucleus.  The remaining cortex was then removed using the irrigation and aspiration handpiece. Provisc was then placed into the capsular bag  to distend it for lens placement.  A lens was then injected into the capsular bag.  The remaining viscoelastic was aspirated.   Wounds were hydrated with balanced salt solution.  The anterior chamber was inflated to a physiologic pressure with balanced salt solution. Iris prolapse repeatedly occurred at the main incision.  It was reposited and incision were hydrated.  Ultimately, two 10-0 nylon sutures were placed through the 2.4 mm incision to ensure a watertight closure and iris stability.    No wound leaks were noted. Vigamox 0.2 ml of a 1mg  per ml solution was injected into the anterior chamber for a dose of 0.2 mg of intracameral antibiotic at the completion of the case.   Timolol and Brimonidine drops were applied to the eye.  The patient was taken to the recovery room in stable condition without complications of anesthesia or surgery.   Geramy Lamorte 02/11/2019, 8:16 AM

## 2019-02-11 NOTE — H&P (Signed)

## 2019-02-11 NOTE — Anesthesia Postprocedure Evaluation (Signed)
Anesthesia Post Note  Patient: Natasha Vasquez  Procedure(s) Performed: CATARACT EXTRACTION PHACO AND INTRAOCULAR LENS PLACEMENT (IOC) RIGHT DIABETIC 4.82 00:46.9 10.3% (Right Eye)     Patient location during evaluation: Phase II Anesthesia Type: MAC Level of consciousness: awake and alert and oriented Pain management: pain level controlled Vital Signs Assessment: vitals unstable and post-procedure vital signs reviewed and stable Respiratory status: spontaneous breathing, nonlabored ventilation and respiratory function stable Cardiovascular status: blood pressure returned to baseline and stable Postop Assessment: no headache and no backache Anesthetic complications: no    Sinda Du

## 2019-02-12 ENCOUNTER — Encounter: Payer: Self-pay | Admitting: *Deleted

## 2019-02-27 ENCOUNTER — Other Ambulatory Visit: Payer: Self-pay

## 2019-02-27 ENCOUNTER — Other Ambulatory Visit: Payer: Self-pay | Admitting: *Deleted

## 2019-02-27 DIAGNOSIS — D500 Iron deficiency anemia secondary to blood loss (chronic): Secondary | ICD-10-CM

## 2019-03-02 ENCOUNTER — Inpatient Hospital Stay: Payer: Medicare PPO

## 2019-03-02 ENCOUNTER — Inpatient Hospital Stay: Payer: Medicare PPO | Attending: Internal Medicine | Admitting: Internal Medicine

## 2019-03-02 ENCOUNTER — Other Ambulatory Visit: Payer: Self-pay

## 2019-03-02 DIAGNOSIS — Z90710 Acquired absence of both cervix and uterus: Secondary | ICD-10-CM | POA: Insufficient documentation

## 2019-03-02 DIAGNOSIS — I10 Essential (primary) hypertension: Secondary | ICD-10-CM | POA: Insufficient documentation

## 2019-03-02 DIAGNOSIS — Z87891 Personal history of nicotine dependence: Secondary | ICD-10-CM | POA: Diagnosis not present

## 2019-03-02 DIAGNOSIS — Z85820 Personal history of malignant melanoma of skin: Secondary | ICD-10-CM | POA: Diagnosis not present

## 2019-03-02 DIAGNOSIS — D500 Iron deficiency anemia secondary to blood loss (chronic): Secondary | ICD-10-CM

## 2019-03-02 DIAGNOSIS — Z7982 Long term (current) use of aspirin: Secondary | ICD-10-CM | POA: Diagnosis not present

## 2019-03-02 DIAGNOSIS — E119 Type 2 diabetes mellitus without complications: Secondary | ICD-10-CM | POA: Insufficient documentation

## 2019-03-02 DIAGNOSIS — E611 Iron deficiency: Secondary | ICD-10-CM | POA: Diagnosis not present

## 2019-03-02 DIAGNOSIS — F329 Major depressive disorder, single episode, unspecified: Secondary | ICD-10-CM | POA: Diagnosis not present

## 2019-03-02 DIAGNOSIS — E785 Hyperlipidemia, unspecified: Secondary | ICD-10-CM | POA: Insufficient documentation

## 2019-03-02 DIAGNOSIS — Z833 Family history of diabetes mellitus: Secondary | ICD-10-CM | POA: Insufficient documentation

## 2019-03-02 DIAGNOSIS — Z8249 Family history of ischemic heart disease and other diseases of the circulatory system: Secondary | ICD-10-CM | POA: Insufficient documentation

## 2019-03-02 DIAGNOSIS — Z800 Family history of malignant neoplasm of digestive organs: Secondary | ICD-10-CM | POA: Insufficient documentation

## 2019-03-02 DIAGNOSIS — Z79899 Other long term (current) drug therapy: Secondary | ICD-10-CM | POA: Diagnosis not present

## 2019-03-02 DIAGNOSIS — D509 Iron deficiency anemia, unspecified: Secondary | ICD-10-CM | POA: Insufficient documentation

## 2019-03-02 DIAGNOSIS — Z9011 Acquired absence of right breast and nipple: Secondary | ICD-10-CM | POA: Diagnosis not present

## 2019-03-02 DIAGNOSIS — Z801 Family history of malignant neoplasm of trachea, bronchus and lung: Secondary | ICD-10-CM | POA: Diagnosis not present

## 2019-03-02 DIAGNOSIS — E7800 Pure hypercholesterolemia, unspecified: Secondary | ICD-10-CM | POA: Insufficient documentation

## 2019-03-02 LAB — CBC WITH DIFFERENTIAL/PLATELET
Abs Immature Granulocytes: 0.05 10*3/uL (ref 0.00–0.07)
Basophils Absolute: 0.1 10*3/uL (ref 0.0–0.1)
Basophils Relative: 1 %
Eosinophils Absolute: 0.2 10*3/uL (ref 0.0–0.5)
Eosinophils Relative: 3 %
HCT: 40 % (ref 36.0–46.0)
Hemoglobin: 12.4 g/dL (ref 12.0–15.0)
Immature Granulocytes: 1 %
Lymphocytes Relative: 25 %
Lymphs Abs: 1.7 10*3/uL (ref 0.7–4.0)
MCH: 27.3 pg (ref 26.0–34.0)
MCHC: 31 g/dL (ref 30.0–36.0)
MCV: 88.1 fL (ref 80.0–100.0)
Monocytes Absolute: 0.6 10*3/uL (ref 0.1–1.0)
Monocytes Relative: 9 %
Neutro Abs: 4.2 10*3/uL (ref 1.7–7.7)
Neutrophils Relative %: 61 %
Platelets: 326 10*3/uL (ref 150–400)
RBC: 4.54 MIL/uL (ref 3.87–5.11)
RDW: 14.9 % (ref 11.5–15.5)
WBC: 6.8 10*3/uL (ref 4.0–10.5)
nRBC: 0 % (ref 0.0–0.2)

## 2019-03-02 LAB — IRON AND TIBC
Iron: 52 ug/dL (ref 28–170)
Saturation Ratios: 18 % (ref 10.4–31.8)
TIBC: 295 ug/dL (ref 250–450)
UIBC: 243 ug/dL

## 2019-03-02 LAB — FERRITIN: Ferritin: 97 ng/mL (ref 11–307)

## 2019-03-02 NOTE — Progress Notes (Signed)
Natasha Vasquez NOTE  Patient Care Team: Brita Romp Dionne Bucy, MD as PCP - General (Family Medicine) Watt Climes, PA as Physician Assistant (Physician Assistant) Marry Guan, Laurice Record, MD as Consulting Physician (Orthopedic Surgery) Pa, [place] as Consulting Physician (Optometry) Bary Castilla, [place] Gleason, MD (General Surgery) Dillingham, Loel Lofty, DO as Attending Physician (Plastic Surgery) Manya Silvas, MD (Inactive) (Gastroenterology) Dasher, Rayvon Char, MD (Dermatology)  CHIEF COMPLAINTS/PURPOSE OF CONSULTATION: Anemia  HEMATOLOGY HISTORY  # ANEMIA EGD/ colonoscopy- 2017 [Dr.Elliot]-August 2020-hemoglobin 10.4 MCV 78; iron saturation 10% ferritin 44; zinc copper/LDH heptoglobin-Normal. CT-C/A-P-NEG.   # Thoracic Aneurysm [4.4cm]- surveillaince  HISTORY OF PRESENTING ILLNESS:  Natasha Vasquez 79 y.o.  female with iron deficient anemia-unclear etiology is here for follow-up.  Patient denies any fatigue.  Denies any nausea vomiting abdominal pain. No Blood in stools black or stools.  She continues to be on oral iron.   Review of Systems  Constitutional: Negative for chills, diaphoresis, fever and weight loss.  HENT: Negative for nosebleeds and sore throat.   Eyes: Negative for double vision.  Respiratory: Negative for cough, hemoptysis, sputum production, shortness of breath and wheezing.   Cardiovascular: Negative for chest pain, palpitations, orthopnea and leg swelling.  Gastrointestinal: Positive for diarrhea. Negative for abdominal pain, blood in stool, constipation, heartburn, melena, nausea and vomiting.  Genitourinary: Negative for dysuria, frequency and urgency.  Musculoskeletal: Positive for back pain and joint pain.  Skin: Negative.  Negative for itching and rash.  Neurological: Negative for dizziness, tingling, focal weakness, weakness and headaches.  Endo/Heme/Allergies: Does not bruise/bleed easily.  Psychiatric/Behavioral: Negative for  depression. The patient is not nervous/anxious and does not have insomnia.     MEDICAL HISTORY:  Past Medical History:  Diagnosis Date  . Anemia in pregnancy   . Arthritis    hands  . Barrett esophagus   . 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  ER/PR NEGATIVE  . Chronic cough   . Complication of anesthesia    20 years ago pts b/p dropped really low, "coded"  . Depression   . Depression   . Diabetes mellitus without complication (HCC)    diet controlled  . Family history of adverse reaction to anesthesia    son PONV  . GERD (gastroesophageal reflux disease)   . Hypercholesteremia   . Hyperlipidemia   . Hypertension   . Melanoma ([place])   . MVP (mitral valve prolapse)    followed by PCP  . Nephritis   . Nephritis   . S/P appendectomy   . Thoracic aortic aneurysm ([place])   . Torn meniscus    left    SURGICAL HISTORY: Past Surgical History:  Procedure Laterality Date  . ABDOMINAL HYSTERECTOMY    . APPENDECTOMY    . BREAST BIOPSY Right 04/25/2017   retroareolar 10:00   wing clip   path pending  . BREAST BIOPSY Right 04/25/2017   10:00  5CMFN  venus clip    path pending  . BREAST BIOPSY Right 05/03/2017   Affirm Bx- path pending  . BREAST CYST ASPIRATION Bilateral    NEG  . BREAST EXCISIONAL BIOPSY Left 20+ yrs ago   NEG  . BREAST RECONSTRUCTION WITH PLACEMENT OF TISSUE EXPANDER AND FLEX HD (ACELLULAR HYDRATED DERMIS) Right 06/03/2017   Procedure: BREAST RECONSTRUCTION WITH PLACEMENT OF TISSUE EXPANDER AND FLEX HD (ACELLULAR HYDRATED DERMIS);  Surgeon: Wallace Going, DO;  Location: [facility];  Service: Plastics;  Laterality: Right;  .  BREAST REDUCTION SURGERY Left 11/06/2017   Procedure: BREAST REDUCTION;  Surgeon: Wallace Going, DO;  Location: [facility];  Service: Plastics;  Laterality: Left;  . BROW LIFT Bilateral 02/01/2015   Procedure: BLEPHAROPLASTY bilateral upper eyelids.;  Surgeon: Karle Starch, MD;  Location: [place];  Service: Ophthalmology;  Laterality: Bilateral;  DIABETIC - diet controlled  . CATARACT EXTRACTION W/PHACO Left 01/14/2019   Procedure: CATARACT EXTRACTION PHACO AND INTRAOCULAR LENS PLACEMENT (IOC) LEFT TECNIS TORIC ADD 5.62 00:46.5 30.0%;  Surgeon: Leandrew Koyanagi, MD;  Location: [place];  Service: Ophthalmology;  Laterality: Left;  diabetic - diet controlled  . CATARACT EXTRACTION W/PHACO Right 02/11/2019   Procedure: CATARACT EXTRACTION PHACO AND INTRAOCULAR LENS PLACEMENT (IOC) RIGHT DIABETIC 4.82 00:46.9 10.3%;  Surgeon: Leandrew Koyanagi, MD;  Location: [place];  Service: Ophthalmology;  Laterality: Right;  Diabetic - diet controlled  . CHOLECYSTECTOMY    . COLONOSCOPY    . COLONOSCOPY WITH PROPOFOL N/A 05/11/2015   Procedure: COLONOSCOPY WITH PROPOFOL;  Surgeon: Manya Silvas, MD;  Location: [facility];  Service: Endoscopy;  Laterality: N/A;  . ESOPHAGOGASTRODUODENOSCOPY    . ESOPHAGOGASTRODUODENOSCOPY (EGD) WITH PROPOFOL  05/11/2015   Procedure: ESOPHAGOGASTRODUODENOSCOPY (EGD) WITH PROPOFOL;  Surgeon: Manya Silvas, MD;  Location: [facility];  Service: Endoscopy;;  . FRACTURE SURGERY Right    arm and shoulder  . JOINT REPLACEMENT    . KNEE ARTHROSCOPY Left 03/14/2016   Procedure: ARTHROSCOPY KNEE, PARTIAL MEDIAL MENISECTOMY, CHONDROPLASTY;  Surgeon: Dereck Leep, MD;  Location: [facility];  Service: Orthopedics;  Laterality: Left;  Marland Kitchen MASTECTOMY Right 2019  . MASTECTOMY W/ SENTINEL NODE BIOPSY Right 06/03/2017   Procedure: MASTECTOMY WITH SENTINEL LYMPH NODE BIOPSY;  Surgeon: Robert Bellow, MD;  Location: [facility];  Service: General;  Laterality: Right;  . PARTIAL HIP ARTHROPLASTY Right   . REDUCTION MAMMAPLASTY Left 2019  . REMOVAL OF BILATERAL TISSUE EXPANDERS WITH PLACEMENT OF BILATERAL BREAST IMPLANTS Right 11/06/2017   Procedure: REMOVAL OF TISSUE EXPANDER WITH PLACEMENT OF BREAST IMPLANT;  Surgeon: Wallace Going, DO;   Location: [facility];  Service: Plastics;  Laterality: Right;  . TONSILLECTOMY      SOCIAL HISTORY: Social History   Socioeconomic History  . Marital status: Married    Spouse name: Sonia Side  . Number of children: 2  . Years of education: Not on file  . Highest education level: Master's degree (e.g., MA, MS, MEng, MEd, MSW, MBA)  Occupational History  . Occupation: retired  Tobacco Use  . Smoking status: Former Smoker    Types: Cigarettes    Quit date: 1965    Years since quitting: 56.1  . Smokeless tobacco: Never Used  . Tobacco comment: smoked in college  Substance and Sexual Activity  . Alcohol use: Yes    Alcohol/week: 7.0 - 14.0 standard drinks    Types: 7 - 14 Glasses of wine per week    Comment: 1-2 glasses of wine per night  . Drug use: No  . Sexual activity: Never  Other Topics Concern  . 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    Social Determinants of Health   Financial Resource Strain:   . Difficulty of Paying Living Expenses: Not on file  Food Insecurity:   . Worried About Charity fundraiser in the Last Year: Not on file  . Ran Out of Food in the  Last Year: Not on file  Transportation Needs:   . Lack of Transportation (Medical): Not on file  . Lack of Transportation (Non-Medical): Not on file  Physical Activity:   . Days of Exercise per Week: Not on file  . Minutes of Exercise per Session: Not on file  Stress: No Stress Concern Present  . Feeling of Stress : Not at all  Social Connections: Unknown  . Frequency of Communication with Friends and Family: Patient refused  . Frequency of Social Gatherings with Friends and Family: Patient refused  . Attends Religious Services: Patient refused  . Active Member of Clubs or Organizations: Patient refused  . Attends Archivist Meetings: Patient refused  . Marital Status: Patient refused  Intimate Partner  Violence: Unknown  . Fear of Current or Ex-Partner: Patient refused  . Emotionally Abused: Patient refused  . Physically Abused: Patient refused  . Sexually Abused: Patient refused    FAMILY HISTORY: Family History  Problem Relation Age of Onset  . Cancer Mother        colon/rectal  . Colon cancer Mother   . Heart disease Father   . Hypertension Father   . Atrial fibrillation Father   . Diabetes Father   . Cancer Brother        lung  . Healthy Son   . Healthy Son   . Breast cancer Neg Hx     ALLERGIES:  is allergic to codeine; lorazepam; oxycodone; paregoric; penicillins; quinolones; and aleve [naproxen sodium].  MEDICATIONS:  Current Outpatient Medications  Medication Sig Dispense Refill  . acetaminophen (TYLENOL) 500 MG tablet Take 500 mg by mouth every 6 (six) hours as needed.    Marland Kitchen albuterol (PROVENTIL HFA;VENTOLIN HFA) 108 (90 Base) MCG/ACT inhaler Inhale 2 puffs into the lungs every 6 (six) hours as needed for wheezing or shortness of breath. 1 Inhaler 2  . ARIPiprazole (ABILIFY) 2 MG tablet Take 1 tablet (2 mg total) by mouth every evening. 90 tablet 2  . aspirin 81 MG tablet Take 81 mg by mouth daily.    Marland Kitchen atorvastatin (LIPITOR) 40 MG tablet Take 1 tablet (40 mg total) by mouth daily. 90 tablet 3  . ferrous sulfate 324 MG TBEC Take 324 mg by mouth daily with breakfast.    . lisinopril (ZESTRIL) 5 MG tablet TAKE 1 TABLET BY MOUTH EVERY DAY 90 tablet 1  . omeprazole (PRILOSEC) 40 MG capsule Take 40 mg by mouth 2 (two) times daily.     Marland Kitchen oxybutynin (DITROPAN-XL) 10 MG 24 hr tablet TAKE 1 TABLET BY MOUTH EVERYDAY AT BEDTIME 90 tablet 1  . tiotropium (SPIRIVA) 18 MCG inhalation capsule Place 1 capsule (18 mcg total) into inhaler and inhale daily. 90 capsule 3  . Venlafaxine HCl 225 MG TB24 TAKE 1 TABLET (225 MG TOTAL) BY MOUTH DAILY. 90 tablet 2  . bismuth subsalicylate (PEPTO BISMOL) 262 MG chewable tablet Chew 262 mg by mouth as needed. **capsule**    . calcium carbonate  (TUMS - DOSED IN MG ELEMENTAL CALCIUM) 500 MG chewable tablet Chew 1 tablet by mouth as needed for indigestion or heartburn.    . Multiple Vitamins-Minerals (CENTRUM SILVER PO) Take 1 tablet by mouth daily.     No current facility-administered medications for this visit.   PHYSICAL EXAMINATION:   Vitals:   03/02/19 1315  BP: (!) 148/90  Pulse: 82  Temp: (!) 96.9 F (36.1 C)   Filed Weights   03/02/19 1315  Weight: 188 lb 3.2 oz (  85.4 kg)    Physical Exam  Constitutional: She is oriented to person, place, and time and well-developed, well-nourished, and in no distress.  HENT:  Head: Normocephalic and atraumatic.  Mouth/Throat: Oropharynx is clear and moist. No oropharyngeal exudate.  Eyes: Pupils are equal, round, and reactive to light.  Cardiovascular: Normal rate and regular rhythm.  Pulmonary/Chest: No respiratory distress. She has no wheezes.  Abdominal: Soft. Bowel sounds are normal. She exhibits no distension and no mass. There is no abdominal tenderness. There is no rebound and no guarding.  Musculoskeletal:        General: No tenderness or edema. Normal range of motion.     Cervical back: Normal range of motion and neck supple.  Neurological: She is alert and oriented to person, place, and time.  Skin: Skin is warm.  Psychiatric: Affect normal.    LABORATORY DATA:  I have reviewed the data as listed Lab Results  Component Value Date   WBC 6.8 03/02/2019   HGB 12.4 03/02/2019   HCT 40.0 03/02/2019   MCV 88.1 03/02/2019   PLT 326 03/02/2019   Recent Labs    03/14/18 1104 09/17/18 1523  NA 142 140  K 4.3 4.3  CL 103 103  CO2 23 23  GLUCOSE 84 76  BUN 13 13  CREATININE 0.81 0.91  CALCIUM 9.4 9.3  GFRNONAA 70 61  GFRAA 81 70  PROT 6.6 6.2  ALBUMIN 4.4 4.5  AST 13 17  ALT 12 14  ALKPHOS 98 87  BILITOT 0.2 0.2     No results found.  Iron deficiency #Iron deficient anemia-  Iron saturation is 10% ferritin is 44.  S/p Venofer-symptoms of fatigue  improved; hemoglobin is 12.5.   #Hold Venofer today.  Continue p.o. iron.  #The etiology of iron deficiency is unclear-CT abdomen pelvis negative; check stool cards x3; if positive would recommend GI evaluation.   # DISPOSITION:stool cards x3.  # HOLD Venfoer IV today  # follow up in 6 months- cbc/iron studies/ferritin; possible Venofer- Dr.B   All questions were answered. The patient knows to call the clinic with any problems, questions or concerns.    Cammie Sickle, MD 03/02/2019 1:58 PM

## 2019-03-02 NOTE — Assessment & Plan Note (Addendum)
#  Iron deficient anemia-  Iron saturation is 10% ferritin is 44.  S/p Venofer-symptoms of fatigue improved; hemoglobin is 12.5.   #Hold Venofer today.  Continue p.o. iron.  #The etiology of iron deficiency is unclear-CT abdomen pelvis negative; check stool cards x3; if positive would recommend GI evaluation.   # DISPOSITION:stool cards x3.  # HOLD Venfoer IV today  # follow up in 6 months- cbc/iron studies/ferritin; possible Venofer- Dr.B

## 2019-03-11 ENCOUNTER — Other Ambulatory Visit: Payer: Self-pay | Admitting: Family Medicine

## 2019-03-11 DIAGNOSIS — F4322 Adjustment disorder with anxiety: Secondary | ICD-10-CM

## 2019-03-11 NOTE — Telephone Encounter (Signed)
Requested medication (s) are due for refill today: yes  Requested medication (s) are on the active medication list:  yes  Last refill:  12/13/2018  Future visit scheduled: yes  Notes to clinic:  this refill cannot be delegated    Requested Prescriptions  Pending Prescriptions Disp Refills   ARIPiprazole (ABILIFY) 2 MG tablet [Pharmacy Med Name: ARIPIPRAZOLE 2 MG TABLET] 90 tablet 2    Sig: Take 1 tablet (2 mg total) by mouth every evening.      Not Delegated - Psychiatry:  Antipsychotics - Second Generation (Atypical) - aripiprazole Failed - 03/11/2019  1:37 AM      Failed - This refill cannot be delegated      Passed - Valid encounter within last 6 months    Recent Outpatient Visits           5 months ago Pulmonary emphysema, unspecified emphysema type [facility])   [facility] [place], Dionne Bucy, MD   8 months ago Primary osteoarthritis of left knee   [facility] [place], Dionne Bucy, MD   8 months ago Pain and swelling of left wrist   [facility] [place], Dionne Bucy, MD   8 months ago Pulmonary emphysema, unspecified emphysema type [facility])   [facility], Dionne Bucy, MD   12 months ago Encounter for annual physical exam   [facility], Dionne Bucy, MD       Future Appointments             In 5 days  [facility], [place]   In 5 days [place], Dionne Bucy, MD [facility] [place], [place]

## 2019-03-12 NOTE — Progress Notes (Addendum)
Subjective:   Natasha Vasquez is a 79 y.o. female who presents for Medicare Annual (Subsequent) preventive examination.   Review of Systems:  N/A  Cardiac Risk Factors include: advanced age (>61men, >31 women);diabetes mellitus;obesity (BMI >30kg/m2);hypertension;dyslipidemia     Objective:     Vitals: BP 136/86 (BP Location: Left Arm)   Pulse 95   Temp 98.2 F (36.8 C)   Ht 5\' 6"  (1.676 m)   Wt 190 lb 12.8 oz (86.5 kg)   SpO2 97%   BMI 30.80 kg/m   Body mass index is 30.8 kg/m.  Advanced Directives 03/16/2019 02/27/2019 01/14/2019 11/28/2018 10/07/2018 09/23/2018 03/12/2018  Does Patient Have a Medical Advance Directive? Yes No Yes Yes Yes No Yes  Type of Paramedic of [place];Living will - [place];Living will [place];Living will [place];Living will - [place];Living will  Does patient want to make changes to medical advance directive? - - No - Patient declined No - Patient declined No - Patient declined - -  Copy of [place] in Chart? Yes - validated most recent copy scanned in chart (See row information) - No - copy requested No - copy requested - - Yes - validated most recent copy scanned in chart (See row information)  Would patient like information on creating a medical advance directive? - No - Patient declined - No - Patient declined - No - Patient declined -    Tobacco Social History   Tobacco Use  Smoking Status Former Smoker  . Types: Cigarettes  . Quit date: 76  . Years since quitting: 56.2  Smokeless Tobacco Never Used  Tobacco Comment   smoked in college     Counseling given: Not Answered Comment: smoked in college   Clinical Intake:  Pre-visit preparation completed: Yes  Pain : No/denies pain Pain Score: 0-No pain     Nutritional Status: BMI > 30  Obese Nutritional Risks: None Diabetes: Yes  How often do you need to  have someone help you when you read instructions, pamphlets, or other written materials from your doctor or pharmacy?: 1 - Never   Diabetes:  Is the patient diabetic?  Yes  If diabetic, was a CBG obtained today?  No  Did the patient bring in their glucometer from home?  No  How often do you monitor your CBG's? Does not check at all.   Financial Strains and Diabetes Management:  Are you having any financial strains with the device, your supplies or your medication? No .  Does the patient want to be seen by Chronic Care Management for management of their diabetes?  No  Would the patient like to be referred to a Nutritionist or for Diabetic Management?  No   Diabetic Exams:  Diabetic Eye Exam: Completed 11/25/18. Repeat yearly.  Diabetic Foot Exam: Completed 03/14/18. Pt has been advised about the importance in completing this exam. Note made to follow up on this at today's in office apt.     Interpreter Needed?: No  Information entered by :: [facility], LPN  Past Medical History:  Diagnosis Date  . Anemia in pregnancy   . Arthritis    hands  . Barrett esophagus   . 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  ER/PR NEGATIVE  . Cataract   . Chronic cough   . Complication of anesthesia    20 years ago pts b/p dropped really low, "  coded"  . Depression   . Depression   . Diabetes mellitus without complication (HCC)    diet controlled  . Family history of adverse reaction to anesthesia    son PONV  . GERD (gastroesophageal reflux disease)   . Hypercholesteremia   . Hyperlipidemia   . Hypertension   . Melanoma ([place])   . MVP (mitral valve prolapse)    followed by PCP  . Nephritis   . Nephritis   . S/P appendectomy   . Thoracic aortic aneurysm ([place])   . Torn meniscus    left   Past Surgical History:  Procedure Laterality Date  . ABDOMINAL HYSTERECTOMY    . APPENDECTOMY    . BREAST BIOPSY Right 04/25/2017   retroareolar 10:00   wing clip   path  pending  . BREAST BIOPSY Right 04/25/2017   10:00  5CMFN  venus clip    path pending  . BREAST BIOPSY Right 05/03/2017   Affirm Bx- path pending  . BREAST CYST ASPIRATION Bilateral    NEG  . BREAST EXCISIONAL BIOPSY Left 20+ yrs ago   NEG  . BREAST RECONSTRUCTION WITH PLACEMENT OF TISSUE EXPANDER AND FLEX HD (ACELLULAR HYDRATED DERMIS) Right 06/03/2017   Procedure: BREAST RECONSTRUCTION WITH PLACEMENT OF TISSUE EXPANDER AND FLEX HD (ACELLULAR HYDRATED DERMIS);  Surgeon: Wallace Going, DO;  Location: [facility];  Service: Plastics;  Laterality: Right;  . BREAST REDUCTION SURGERY Left 11/06/2017   Procedure: BREAST REDUCTION;  Surgeon: Wallace Going, DO;  Location: [facility];  Service: Plastics;  Laterality: Left;  . BROW LIFT Bilateral 02/01/2015   Procedure: BLEPHAROPLASTY bilateral upper eyelids.;  Surgeon: Karle Starch, MD;  Location: [place];  Service: Ophthalmology;  Laterality: Bilateral;  DIABETIC - diet controlled  . CATARACT EXTRACTION W/PHACO Left 01/14/2019   Procedure: CATARACT EXTRACTION PHACO AND INTRAOCULAR LENS PLACEMENT (IOC) LEFT TECNIS TORIC ADD 5.62 00:46.5 30.0%;  Surgeon: Leandrew Koyanagi, MD;  Location: [place];  Service: Ophthalmology;  Laterality: Left;  diabetic - diet controlled  . CATARACT EXTRACTION W/PHACO Right 02/11/2019   Procedure: CATARACT EXTRACTION PHACO AND INTRAOCULAR LENS PLACEMENT (IOC) RIGHT DIABETIC 4.82 00:46.9 10.3%;  Surgeon: Leandrew Koyanagi, MD;  Location: [place];  Service: Ophthalmology;  Laterality: Right;  Diabetic - diet controlled  . CHOLECYSTECTOMY    . COLONOSCOPY    . COLONOSCOPY WITH PROPOFOL N/A 05/11/2015   Procedure: COLONOSCOPY WITH PROPOFOL;  Surgeon: Manya Silvas, MD;  Location: [facility];  Service: Endoscopy;  Laterality: N/A;  . ESOPHAGOGASTRODUODENOSCOPY    . ESOPHAGOGASTRODUODENOSCOPY (EGD) WITH PROPOFOL  05/11/2015   Procedure: ESOPHAGOGASTRODUODENOSCOPY (EGD) WITH  PROPOFOL;  Surgeon: Manya Silvas, MD;  Location: [facility] ENDOSCOPY;  Service: Endoscopy;;  . FRACTURE SURGERY Right    arm and shoulder  . JOINT REPLACEMENT    . KNEE ARTHROSCOPY Left 03/14/2016   Procedure: ARTHROSCOPY KNEE, PARTIAL MEDIAL MENISECTOMY, CHONDROPLASTY;  Surgeon: Dereck Leep, MD;  Location: [facility];  Service: Orthopedics;  Laterality: Left;  Marland Kitchen MASTECTOMY Right 2019  . MASTECTOMY W/ SENTINEL NODE BIOPSY Right 06/03/2017   Procedure: MASTECTOMY WITH SENTINEL LYMPH NODE BIOPSY;  Surgeon: Robert Bellow, MD;  Location: [facility];  Service: General;  Laterality: Right;  . PARTIAL HIP ARTHROPLASTY Right   . REDUCTION MAMMAPLASTY Left 2019  . REMOVAL OF BILATERAL TISSUE EXPANDERS WITH PLACEMENT OF BILATERAL BREAST IMPLANTS Right 11/06/2017   Procedure: REMOVAL OF TISSUE EXPANDER WITH PLACEMENT OF BREAST IMPLANT;  Surgeon: Wallace Going, DO;  Location: [facility];  Service: Plastics;  Laterality: Right;  . TONSILLECTOMY     Family History  Problem Relation Age of Onset  . Cancer Mother        colon/rectal  . Colon cancer Mother   . Heart disease Father   . Hypertension Father   . Atrial fibrillation Father   . Diabetes Father   . Cancer Brother        lung  . Healthy Son   . Healthy Son   . Breast cancer Neg Hx    Social History   Socioeconomic History  . Marital status: Married    Spouse name: Sonia Side  . Number of children: 2  . Years of education: Not on file  . Highest education level: Master's degree (e.g., MA, MS, MEng, MEd, MSW, MBA)  Occupational History  . Occupation: retired  Tobacco Use  . Smoking status: Former Smoker    Types: Cigarettes    Quit date: 1965    Years since quitting: 56.2  . Smokeless tobacco: Never Used  . Tobacco comment: smoked in college  Substance and Sexual Activity  . Alcohol use: Yes    Alcohol/week: 7.0 - 14.0 standard drinks    Types: 7 - 14 Glasses of wine per week    Comment: 1-2 glasses of wine per night  .  Drug use: No  . Sexual activity: Never  Other Topics Concern  . 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    Social Determinants of Health   Financial Resource Strain: Low Risk   . Difficulty of Paying Living Expenses: Not hard at all  Food Insecurity: No Food Insecurity  . Worried About Charity fundraiser in the Last Year: Never true  . Ran Out of Food in the Last Year: Never true  Transportation Needs: No Transportation Needs  . Lack of Transportation (Medical): No  . Lack of Transportation (Non-Medical): No  Physical Activity: Insufficiently Active  . Days of Exercise per Week: 3 days  . Minutes of Exercise per Session: 30 min  Stress: No Stress Concern Present  . Feeling of Stress : Not at all  Social Connections: Not Isolated  . Frequency of Communication with Friends and Family: More than three times a week  . Frequency of Social Gatherings with Friends and Family: Three times a week  . Attends Religious Services: More than 4 times per year  . Active Member of Clubs or Organizations: Yes  . Attends Archivist Meetings: Never  . Marital Status: Married    Outpatient Encounter Medications as of 03/16/2019  Medication Sig  . acetaminophen (TYLENOL) 500 MG tablet Take 500 mg by mouth every 6 (six) hours as needed.  Marland Kitchen albuterol (PROVENTIL HFA;VENTOLIN HFA) 108 (90 Base) MCG/ACT inhaler Inhale 2 puffs into the lungs every 6 (six) hours as needed for wheezing or shortness of breath.  . ARIPiprazole (ABILIFY) 2 MG tablet TAKE 1 TABLET (2 MG TOTAL) BY MOUTH EVERY EVENING.  Marland Kitchen aspirin 81 MG tablet Take 81 mg by mouth daily.  Marland Kitchen atorvastatin (LIPITOR) 40 MG tablet Take 1 tablet (40 mg total) by mouth daily.  Marland Kitchen bismuth subsalicylate (PEPTO BISMOL) 262 MG chewable tablet Chew 262 mg by mouth as needed. **capsule**  . calcium carbonate (TUMS - DOSED IN MG ELEMENTAL CALCIUM) 500 MG chewable  tablet Chew 1 tablet by mouth as needed for indigestion or heartburn.  Marland Kitchen  clobetasol ointment (TEMOVATE) 0.05 % APPLY TO AFFECTED AREAS AT NIGHT AS NEEDED  . ferrous sulfate 324 MG TBEC Take 324 mg by mouth daily with breakfast.  . fluocinonide (LIDEX) 0.05 % external solution APPLY TO AFFECTED AREAS OF SCALP DAILY UNTIL CLEAR, THEN FOR FLARES  . lisinopril (ZESTRIL) 5 MG tablet TAKE 1 TABLET BY MOUTH EVERY DAY  . Multiple Vitamins-Minerals (CENTRUM SILVER PO) Take 1 tablet by mouth daily.  Marland Kitchen omeprazole (PRILOSEC) 40 MG capsule Take 40 mg by mouth 2 (two) times daily.   Marland Kitchen oxybutynin (DITROPAN-XL) 10 MG 24 hr tablet TAKE 1 TABLET BY MOUTH EVERYDAY AT BEDTIME  . tiotropium (SPIRIVA) 18 MCG inhalation capsule Place 1 capsule (18 mcg total) into inhaler and inhale daily.  . Venlafaxine HCl 225 MG TB24 TAKE 1 TABLET (225 MG TOTAL) BY MOUTH DAILY.   No facility-administered encounter medications on file as of 03/16/2019.    Activities of Daily Living In your present state of health, do you have any difficulty performing the following activities: 03/16/2019 01/14/2019  Hearing? N N  Vision? N N  Difficulty concentrating or making decisions? N -  Walking or climbing stairs? Y N  Comment Due to left knee pains from arthritis. -  Dressing or bathing? N N  Doing errands, shopping? N -  Preparing Food and eating ? N -  Using the Toilet? N -  In the past six months, have you accidently leaked urine? Y -  Comment Due to urgeny during the night. -  Do you have problems with loss of bowel control? N -  Managing your Medications? N -  Managing your Finances? N -  Some recent data might be hidden    Patient Care Team: Virginia Crews, MD as PCP - General (Family Medicine) Watt Climes, PA as Physician Assistant (Physician Assistant) Marry Guan, Laurice Record, MD as Consulting Physician (Orthopedic Surgery) Pa, [place] as Consulting Physician (Optometry) Byrnett, Forest Gleason, MD (General  Surgery) Dasher, Rayvon Char, MD (Dermatology) Grace Isaac, MD as Consulting Physician (Cardiothoracic Surgery) Cammie Sickle, MD as Consulting Physician (Internal Medicine)    Assessment:   This is a routine wellness examination for Natasha Vasquez.  Exercise Activities and Dietary recommendations Current Exercise Habits: Home exercise routine, Type of exercise: walking, Time (Minutes): 30, Frequency (Times/Week): 3, Weekly Exercise (Minutes/Week): 90, Intensity: Mild, Exercise limited by: orthopedic condition(s)  Goals    . DIET - INCREASE WATER INTAKE     Recommend to increase water intake to 4-6 glasses a day.        Fall Risk: Fall Risk  03/16/2019 07/28/2018 07/08/2018 06/24/2018 03/12/2018  Falls in the past year? 0 0 0 1 0  Number falls in past yr: 0 0 - 0 -  Injury with Fall? 0 - - 0 -  Follow up - - Falls evaluation completed - -    FALL RISK PREVENTION PERTAINING TO THE HOME:  Any stairs in or around the home? Yes  If so, are there any without handrails? No   Home free of loose throw rugs in walkways, pet beds, electrical cords, etc? Yes  Adequate lighting in your home to reduce risk of falls? Yes   ASSISTIVE DEVICES UTILIZED TO PREVENT FALLS:  Life alert? No  Use of a cane, walker or w/c? No  Grab bars in the bathroom? Yes  Shower chair or bench in shower? Yes  Elevated toilet seat or a handicapped toilet? Yes   TIMED UP AND GO:  Was the test performed? No .    Depression Screen PHQ 2/9 Scores 03/16/2019 03/12/2018 09/12/2017 06/12/2017  PHQ - 2 Score 1 2 2  0  PHQ- 9 Score - 9 7 3      Cognitive Function: Declined today.      6CIT Screen 03/12/2018 03/11/2017  What Year? 0 points 0 points  What month? 0 points 0 points  What time? 0 points 0 points  Count back from 20 0 points 0 points  Months in reverse 2 points 0 points  Repeat phrase 0 points 0 points  Total Score 2 0    Immunization History  Administered Date(s) Administered  . Fluad Quad(high  Dose 65+) 09/09/2018  . Influenza Split 10/24/2011  . Influenza, High Dose Seasonal PF 11/18/2013, 01/24/2016, 10/08/2016, 09/12/2017  . Influenza-Unspecified 10/24/2011, 10/14/2012, 11/18/2013  . Moderna SARS-COVID-2 Vaccination 01/30/2019  . Pneumococcal Conjugate-13 08/21/2013  . Pneumococcal Polysaccharide-23 01/24/2016  . Tdap 10/24/2011  . Zoster Recombinat (Shingrix) 04/01/2016, 08/14/2016    Qualifies for Shingles Vaccine? Completed series  Tdap: Up to date  Flu Vaccine: Up to date  Pneumococcal Vaccine: Completed series  Screening Tests Health Maintenance  Topic Date Due  . FOOT EXAM  03/15/2019  . HEMOGLOBIN A1C  03/17/2019  . OPHTHALMOLOGY EXAM  11/25/2019  . DEXA SCAN  03/09/2021  . TETANUS/TDAP  10/23/2021  . INFLUENZA VACCINE  Completed  . PNA vac Low Risk Adult  Completed    Cancer Screenings:  Colorectal Screening: No longer required.   Mammogram: No longer required.   Bone Density: Completed 03/09/16. Results reflect OSTEOPENIA. Repeat every 5 years.   Lung Cancer Screening: (Low Dose CT Chest recommended if Age 23-80 years, 30 pack-year currently smoking OR have quit w/in 15years.) does not qualify.   Additional Screening:  Dental Screening: Recommended annual dental exams for proper oral hygiene   Community Resource Referral:  CRR required this visit?  No       Plan:  I have personally reviewed and addressed the Medicare Annual Wellness questionnaire and have noted the following in the patient's chart:  A. Medical and social history B. Use of alcohol, tobacco or illicit drugs  C. Current medications and supplements D. Functional ability and status E.  Nutritional status F.  Physical activity G. Advance directives H. List of other physicians I.  Hospitalizations, surgeries, and ER visits in previous 12 months J.  [place] such as hearing and vision if needed, cognitive and depression L. Referrals and appointments   In  addition, I have reviewed and discussed with patient certain preventive protocols, quality metrics, and best practice recommendations. A written personalized care plan for preventive services as well as general preventive health recommendations were provided to patient.   Glendora Score, [place]  [id number] Nurse Health Advisor   Nurse Notes: Pt needs a diabetic foot exam at today's OV.

## 2019-03-16 ENCOUNTER — Ambulatory Visit (INDEPENDENT_AMBULATORY_CARE_PROVIDER_SITE_OTHER): Payer: Medicare PPO | Admitting: Family Medicine

## 2019-03-16 ENCOUNTER — Encounter: Payer: Self-pay | Admitting: Family Medicine

## 2019-03-16 ENCOUNTER — Ambulatory Visit (INDEPENDENT_AMBULATORY_CARE_PROVIDER_SITE_OTHER): Payer: Medicare PPO

## 2019-03-16 ENCOUNTER — Other Ambulatory Visit: Payer: Self-pay

## 2019-03-16 VITALS — BP 136/86 | HR 95 | Temp 98.2°F | Ht 66.0 in | Wt 190.8 lb

## 2019-03-16 VITALS — BP 136/86 | HR 95 | Temp 98.2°F | Resp 16 | Ht 65.0 in | Wt 190.0 lb

## 2019-03-16 DIAGNOSIS — E1142 Type 2 diabetes mellitus with diabetic polyneuropathy: Secondary | ICD-10-CM

## 2019-03-16 DIAGNOSIS — E1169 Type 2 diabetes mellitus with other specified complication: Secondary | ICD-10-CM

## 2019-03-16 DIAGNOSIS — E7800 Pure hypercholesterolemia, unspecified: Secondary | ICD-10-CM

## 2019-03-16 DIAGNOSIS — Z0000 Encounter for general adult medical examination without abnormal findings: Secondary | ICD-10-CM

## 2019-03-16 DIAGNOSIS — E669 Obesity, unspecified: Secondary | ICD-10-CM | POA: Diagnosis not present

## 2019-03-16 DIAGNOSIS — Z6831 Body mass index (BMI) 31.0-31.9, adult: Secondary | ICD-10-CM | POA: Diagnosis not present

## 2019-03-16 DIAGNOSIS — F3341 Major depressive disorder, recurrent, in partial remission: Secondary | ICD-10-CM

## 2019-03-16 DIAGNOSIS — M1712 Unilateral primary osteoarthritis, left knee: Secondary | ICD-10-CM

## 2019-03-16 DIAGNOSIS — E785 Hyperlipidemia, unspecified: Secondary | ICD-10-CM

## 2019-03-16 DIAGNOSIS — I10 Essential (primary) hypertension: Secondary | ICD-10-CM | POA: Diagnosis not present

## 2019-03-16 DIAGNOSIS — J439 Emphysema, unspecified: Secondary | ICD-10-CM

## 2019-03-16 DIAGNOSIS — E039 Hypothyroidism, unspecified: Secondary | ICD-10-CM | POA: Diagnosis not present

## 2019-03-16 DIAGNOSIS — E038 Other specified hypothyroidism: Secondary | ICD-10-CM

## 2019-03-16 NOTE — Assessment & Plan Note (Signed)
Chronic and previously well controlled Diet controlled and on no meds UTD on vaccinations and screenings Foot exam completed today On ACEi and statin Recheck A1c F/u in 6 months

## 2019-03-16 NOTE — Assessment & Plan Note (Signed)
Well controlled Continue Effexor and abilify at current doses

## 2019-03-16 NOTE — Patient Instructions (Signed)
Preventive Care 79 Years and Older, Female Preventive care refers to lifestyle choices and visits with your health care provider that can promote health and wellness. This includes:  A yearly physical exam. This is also called an annual well check.  Regular dental and eye exams.  Immunizations.  Screening for certain conditions.  Healthy lifestyle choices, such as diet and exercise. What can I expect for my preventive care visit? Physical exam Your health care provider will check:  Height and weight. These may be used to calculate body mass index (BMI), which is a measurement that tells if you are at a healthy weight.  Heart rate and blood pressure.  Your skin for abnormal spots. Counseling Your health care provider may ask you questions about:  Alcohol, tobacco, and drug use.  Emotional well-being.  Home and relationship well-being.  Sexual activity.  Eating habits.  History of falls.  Memory and ability to understand (cognition).  Work and work Statistician.  Pregnancy and menstrual history. What immunizations do I need?  Influenza (flu) vaccine  This is recommended every year. Tetanus, diphtheria, and pertussis (Tdap) vaccine  You may need a Td booster every 10 years. Varicella (chickenpox) vaccine  You may need this vaccine if you have not already been vaccinated. Zoster (shingles) vaccine  You may need this after age 79. Pneumococcal conjugate (PCV13) vaccine  One dose is recommended after age 79. Pneumococcal polysaccharide (PPSV23) vaccine  One dose is recommended after age 72. Measles, mumps, and rubella (MMR) vaccine  You may need at least one dose of MMR if you were born in 1957 or later. You may also need a second dose. Meningococcal conjugate (MenACWY) vaccine  You may need this if you have certain conditions. Hepatitis A vaccine  You may need this if you have certain conditions or if you travel or work in places where you may be exposed  to hepatitis A. Hepatitis B vaccine  You may need this if you have certain conditions or if you travel or work in places where you may be exposed to hepatitis B. Haemophilus influenzae type b (Hib) vaccine  You may need this if you have certain conditions. You may receive vaccines as individual doses or as more than one vaccine together in one shot (combination vaccines). Talk with your health care provider about the risks and benefits of combination vaccines. What tests do I need? Blood tests  Lipid and cholesterol levels. These may be checked every 5 years, or more frequently depending on your overall health.  Hepatitis C test.  Hepatitis B test. Screening  Lung cancer screening. You may have this screening every year starting at age 79 if you have a 30-pack-year history of smoking and currently smoke or have quit within the past 15 years.  Colorectal cancer screening. All adults should have this screening starting at age 79 and continuing until age 15. Your health care provider may recommend screening at age 79 if you are at increased risk. You will have tests every 1-10 years, depending on your results and the type of screening test.  Diabetes screening. This is done by checking your blood sugar (glucose) after you have not eaten for a while (fasting). You may have this done every 1-3 years.  Mammogram. This may be done every 1-2 years. Talk with your health care provider about how often you should have regular mammograms.  BRCA-related cancer screening. This may be done if you have a family history of breast, ovarian, tubal, or peritoneal cancers.  Other tests  Sexually transmitted disease (STD) testing.  Bone density scan. This is done to screen for osteoporosis. You may have this done starting at age 79. Follow these instructions at home: Eating and drinking  Eat a diet that includes fresh fruits and vegetables, whole grains, lean protein, and low-fat dairy products. Limit  your intake of foods with high amounts of sugar, saturated fats, and salt.  Take vitamin and mineral supplements as recommended by your health care provider.  Do not drink alcohol if your health care provider tells you not to drink.  If you drink alcohol: ? Limit how much you have to 0-1 drink a day. ? Be aware of how much alcohol is in your drink. In the [place] one drink equals one 12 oz bottle of beer (355 mL), one 5 oz glass of wine (148 mL), or one 1 oz glass of hard liquor (44 mL). Lifestyle  Take daily care of your teeth and gums.  Stay active. Exercise for at least 30 minutes on 5 or more days each week.  Do not use any products that contain nicotine or tobacco, such as cigarettes, e-cigarettes, and chewing tobacco. If you need help quitting, ask your health care provider.  If you are sexually active, practice safe sex. Use a condom or other form of protection in order to prevent STIs (sexually transmitted infections).  Talk with your health care provider about taking a low-dose aspirin or statin. What's next?  Go to your health care provider once a year for a well check visit.  Ask your health care provider how often you should have your eyes and teeth checked.  Stay up to date on all vaccines. This information is not intended to replace advice given to you by your health care provider. Make sure you discuss any questions you have with your health care provider. Document Revised: 12/26/2017 Document Reviewed: 12/26/2017 Elsevier Patient Education  2020 [employer].

## 2019-03-16 NOTE — Assessment & Plan Note (Signed)
Ongoing with intermittent numbness of bilateral toes Continue regular self-foot exams No medication indicated at this time Could consider gabapentin in the future

## 2019-03-16 NOTE — Assessment & Plan Note (Signed)
Chronic and stable Continue Spiriva Albuterol prn mucinex prn F/u in 6-65months

## 2019-03-16 NOTE — Assessment & Plan Note (Signed)
Chronic Previously improved with corticosteroid injection Will repeat today (see procedure notE) Discussed repeating no more than every 3 months as needed to help with funtion

## 2019-03-16 NOTE — Patient Instructions (Signed)
Natasha Vasquez , Thank you for taking time to come for your Medicare Wellness Visit. I appreciate your ongoing commitment to your health goals. Please review the following plan we discussed and let me know if I can assist you in the future.   Screening recommendations/referrals: Colonoscopy: No longer required.  Mammogram: No longer required.  Bone Density: Up to date, due 02/2021 Recommended yearly ophthalmology/optometry visit for glaucoma screening and checkup Recommended yearly dental visit for hygiene and checkup  Vaccinations: Influenza vaccine: Up to date Pneumococcal vaccine: Completed series Tdap vaccine: Up to date, due 10/2021 Shingles vaccine: Completed series    Advanced directives: Currently on file.  Conditions/risks identified: Recommend to increase water intake to 6-8 8 oz glasses of water a day.   Next appointment: 3:00 PM today with Dr Brita Romp    Preventive Care 27 Years and Older, Female Preventive care refers to lifestyle choices and visits with your health care provider that can promote health and wellness. What does preventive care include?  A yearly physical exam. This is also called an annual well check.  Dental exams once or twice a year.  Routine eye exams. Ask your health care provider how often you should have your eyes checked.  Personal lifestyle choices, including:  Daily care of your teeth and gums.  Regular physical activity.  Eating a healthy diet.  Avoiding tobacco and drug use.  Limiting alcohol use.  Practicing safe sex.  Taking low-dose aspirin every day.  Taking vitamin and mineral supplements as recommended by your health care provider. What happens during an annual well check? The services and screenings done by your health care provider during your annual well check will depend on your age, overall health, lifestyle risk factors, and family history of disease. Counseling  Your health care provider may ask you questions  about your:  Alcohol use.  Tobacco use.  Drug use.  Emotional well-being.  Home and relationship well-being.  Sexual activity.  Eating habits.  History of falls.  Memory and ability to understand (cognition).  Work and work Statistician.  Reproductive health. Screening  You may have the following tests or measurements:  Height, weight, and BMI.  Blood pressure.  Lipid and cholesterol levels. These may be checked every 5 years, or more frequently if you are over 74 years old.  Skin check.  Lung cancer screening. You may have this screening every year starting at age 55 if you have a 30-pack-year history of smoking and currently smoke or have quit within the past 15 years.  Fecal occult blood test (FOBT) of the stool. You may have this test every year starting at age 54.  Flexible sigmoidoscopy or colonoscopy. You may have a sigmoidoscopy every 5 years or a colonoscopy every 10 years starting at age 79.  Hepatitis C blood test.  Hepatitis B blood test.  Sexually transmitted disease (STD) testing.  Diabetes screening. This is done by checking your blood sugar (glucose) after you have not eaten for a while (fasting). You may have this done every 1-3 years.  Bone density scan. This is done to screen for osteoporosis. You may have this done starting at age 30.  Mammogram. This may be done every 1-2 years. Talk to your health care provider about how often you should have regular mammograms. Talk with your health care provider about your test results, treatment options, and if necessary, the need for more tests. Vaccines  Your health care provider may recommend certain vaccines, such as:  Influenza vaccine.  This is recommended every year.  Tetanus, diphtheria, and acellular pertussis (Tdap, Td) vaccine. You may need a Td booster every 10 years.  Zoster vaccine. You may need this after age 50.  Pneumococcal 13-valent conjugate (PCV13) vaccine. One dose is  recommended after age 79.  Pneumococcal polysaccharide (PPSV23) vaccine. One dose is recommended after age 75. Talk to your health care provider about which screenings and vaccines you need and how often you need them. This information is not intended to replace advice given to you by your health care provider. Make sure you discuss any questions you have with your health care provider. Document Released: 01/28/2015 Document Revised: 09/21/2015 Document Reviewed: 11/02/2014 Elsevier Interactive Patient Education  2017 [employer] Prevention in the Home Falls can cause injuries. They can happen to people of all ages. There are many things you can do to make your home safe and to help prevent falls. What can I do on the outside of my home?  Regularly fix the edges of walkways and driveways and fix any cracks.  Remove anything that might make you trip as you walk through a door, such as a raised step or threshold.  Trim any bushes or trees on the path to your home.  Use bright outdoor lighting.  Clear any walking paths of anything that might make someone trip, such as rocks or tools.  Regularly check to see if handrails are loose or broken. Make sure that both sides of any steps have handrails.  Any raised decks and porches should have guardrails on the edges.  Have any leaves, snow, or ice cleared regularly.  Use sand or salt on walking paths during winter.  Clean up any spills in your garage right away. This includes oil or grease spills. What can I do in the bathroom?  Use night lights.  Install grab bars by the toilet and in the tub and shower. Do not use towel bars as grab bars.  Use non-skid mats or decals in the tub or shower.  If you need to sit down in the shower, use a plastic, non-slip stool.  Keep the floor dry. Clean up any water that spills on the floor as soon as it happens.  Remove soap buildup in the tub or shower regularly.  Attach bath mats  securely with double-sided non-slip rug tape.  Do not have throw rugs and other things on the floor that can make you trip. What can I do in the bedroom?  Use night lights.  Make sure that you have a light by your bed that is easy to reach.  Do not use any sheets or blankets that are too big for your bed. They should not hang down onto the floor.  Have a firm chair that has side arms. You can use this for support while you get dressed.  Do not have throw rugs and other things on the floor that can make you trip. What can I do in the kitchen?  Clean up any spills right away.  Avoid walking on wet floors.  Keep items that you use a lot in easy-to-reach places.  If you need to reach something above you, use a strong step stool that has a grab bar.  Keep electrical cords out of the way.  Do not use floor polish or wax that makes floors slippery. If you must use wax, use non-skid floor wax.  Do not have throw rugs and other things on the floor that can make  you trip. What can I do with my stairs?  Do not leave any items on the stairs.  Make sure that there are handrails on both sides of the stairs and use them. Fix handrails that are broken or loose. Make sure that handrails are as long as the stairways.  Check any carpeting to make sure that it is firmly attached to the stairs. Fix any carpet that is loose or worn.  Avoid having throw rugs at the top or bottom of the stairs. If you do have throw rugs, attach them to the floor with carpet tape.  Make sure that you have a light switch at the top of the stairs and the bottom of the stairs. If you do not have them, ask someone to add them for you. What else can I do to help prevent falls?  Wear shoes that:  Do not have high heels.  Have rubber bottoms.  Are comfortable and fit you well.  Are closed at the toe. Do not wear sandals.  If you use a stepladder:  Make sure that it is fully opened. Do not climb a closed  stepladder.  Make sure that both sides of the stepladder are locked into place.  Ask someone to hold it for you, if possible.  Clearly mark and make sure that you can see:  Any grab bars or handrails.  First and last steps.  Where the edge of each step is.  Use tools that help you move around (mobility aids) if they are needed. These include:  Canes.  Walkers.  Scooters.  Crutches.  Turn on the lights when you go into a dark area. Replace any light bulbs as soon as they burn out.  Set up your furniture so you have a clear path. Avoid moving your furniture around.  If any of your floors are uneven, fix them.  If there are any pets around you, be aware of where they are.  Review your medicines with your doctor. Some medicines can make you feel dizzy. This can increase your chance of falling. Ask your doctor what other things that you can do to help prevent falls. This information is not intended to replace advice given to you by your health care provider. Make sure you discuss any questions you have with your health care provider. Document Released: 10/28/2008 Document Revised: 06/09/2015 Document Reviewed: 02/05/2014 Elsevier Interactive Patient Education  2017 [employer].

## 2019-03-16 NOTE — Progress Notes (Signed)
Patient: Natasha Vasquez, Female    DOB: 1940/02/03, 79 y.o.   MRN: [id number] Visit Date: 03/16/2019  Today's Provider: Lavon Paganini, MD   Chief Complaint  Patient presents with  . Annual Exam   Subjective:     Complete Physical Natasha Vasquez is a 79 y.o. female. She feels well. She reports exercising yes. She reports she is sleeping well. 03/14/2018 CPE 07/08/2018 Mammogram-F/B Dr. Bary Castilla 03/09/2016 BMD-Osteopenia 05/11/2015 Colonoscopy -----------------------------------------------------------  L knee pain Worked well last time and helped for about 6 months Pain was significantly less after injection last time Would like a repeat injection today  Review of Systems  Constitutional: Negative.   HENT: Negative.   Eyes: Negative.   Respiratory: Negative.   Cardiovascular: Negative.   Gastrointestinal: Negative.   Endocrine: Negative.   Genitourinary: Negative.   Musculoskeletal: Negative.   Skin: Negative.   Allergic/Immunologic: Negative.   Neurological: Positive for light-headedness.  Hematological: Negative.   Psychiatric/Behavioral: Negative.     Social History   Socioeconomic History  . Marital status: Married    Spouse name: Natasha Vasquez  . Number of children: 2  . Years of education: Not on file  . Highest education level: Master's degree (e.g., MA, MS, MEng, MEd, MSW, MBA)  Occupational History  . Occupation: retired  Tobacco Use  . Smoking status: Former Smoker    Types: Cigarettes    Quit date: 1965    Years since quitting: 56.2  . Smokeless tobacco: Never Used  . Tobacco comment: smoked in college  Substance and Sexual Activity  . Alcohol use: Yes    Alcohol/week: 7.0 - 14.0 standard drinks    Types: 7 - 14 Glasses of wine per week    Comment: 1-2 glasses of wine per night  . Drug use: No  . Sexual activity: Never  Other Topics Concern  . 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  No smoking; 2 glasses of wine each day.    Social Determinants of Health   Financial Resource Strain: Low Risk   . Difficulty of Paying Living Expenses: Not hard at all  Food Insecurity: No Food Insecurity  . Worried About Charity fundraiser in the Last Year: Never true  . Ran Out of Food in the Last Year: Never true  Transportation Needs: No Transportation Needs  . Lack of Transportation (Medical): No  . Lack of Transportation (Non-Medical): No  Physical Activity: Insufficiently Active  . Days of Exercise per Week: 3 days  . Minutes of Exercise per Session: 30 min  Stress: No Stress Concern Present  . Feeling of Stress : Not at all  Social Connections: Not Isolated  . Frequency of Communication with Friends and Family: More than three times a week  . Frequency of Social Gatherings with Friends and Family: Three times a week  . Attends Religious Services: More than 4 times per year  . Active Member of Clubs or Organizations: Yes  . Attends Archivist Meetings: Never  . Marital Status: Married  Human resources officer Violence: Not At Risk  . Fear of Current or Ex-Partner: No  . Emotionally Abused: No  . Physically Abused: No  . Sexually Abused: No    Past Medical History:  Diagnosis Date  . Anemia in pregnancy   . Arthritis    hands  . Barrett esophagus   . 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  ER/PR NEGATIVE  . Cataract   . Chronic cough   . Complication of anesthesia    20 years ago pts b/p dropped really low, "coded"  . Depression   . Depression   . Diabetes mellitus without complication (HCC)    diet controlled  . Family history of adverse reaction to anesthesia    son PONV  . GERD (gastroesophageal reflux disease)   . Hypercholesteremia   . Hyperlipidemia   . Hypertension   . Melanoma ([place])   . MVP (mitral valve prolapse)    followed by PCP  . Nephritis   . Nephritis   . S/P appendectomy   . Thoracic aortic aneurysm ([place])    . Torn meniscus    left     Patient Active Problem List   Diagnosis Date Noted  . Iron deficiency 10/07/2018  . Primary osteoarthritis of left knee 09/17/2018  . Emphysema lung ([place]) 03/14/2018  . Diabetic peripheral neuropathy associated with type 2 diabetes mellitus ([place]) 03/14/2018  . Metatarsalgia of both feet 03/14/2018  . Swelling of first metatarsophalangeal (MTP) joint 03/14/2018  . Abnormality of lung 03/14/2018  . Acquired absence of right breast 06/11/2017  . 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[place]) 05/09/2017  . 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 Dysphagia 03/11/2017  . Class 1 obesity with serious comorbidity and body mass index (BMI) of 31.0 to 31.9 in adult 03/11/2017  . Subclinical hypothyroidism 06/08/2015  . Osteopenia 06/08/2015  . Billowing mitral valve 06/08/2015  . Melanoma in situ ([place]) 06/08/2015  . Cramps of lower extremity 06/08/2015  . Arthritis 06/08/2015  . Microcytic anemia 06/08/2015  . Type 2 diabetes mellitus ([place]) 05/18/2015  . Colitis, enteritis, and gastroenteritis of presumed infectious origin 05/18/2015  . Closed fracture of neck of right femur ([place]) 03/25/2015  . Clinical depression 01/26/2015  . Hyperlipidemia associated with type 2 diabetes mellitus ([place]) 10/28/2014  . Essential hypertension 09/27/2014  . GERD (gastroesophageal reflux disease) 08/06/2014  . Urinary frequency 07/16/2014  . Bell palsy 04/24/2004    Past Surgical History:  Procedure Laterality Date  . ABDOMINAL HYSTERECTOMY    . APPENDECTOMY    . BREAST BIOPSY Right 04/25/2017   retroareolar 10:00   wing clip   path pending  . BREAST BIOPSY Right 04/25/2017   10:00  5CMFN  venus clip    path pending  . BREAST BIOPSY Right 05/03/2017   Affirm Bx- path pending  . BREAST CYST ASPIRATION Bilateral    NEG  . BREAST EXCISIONAL BIOPSY Left 20+ yrs ago   NEG  . BREAST RECONSTRUCTION WITH PLACEMENT OF TISSUE EXPANDER  AND FLEX HD (ACELLULAR HYDRATED DERMIS) Right 06/03/2017   Procedure: BREAST RECONSTRUCTION WITH PLACEMENT OF TISSUE EXPANDER AND FLEX HD (ACELLULAR HYDRATED DERMIS);  Surgeon: Wallace Going, DO;  Location: [facility];  Service: Plastics;  Laterality: Right;  . BREAST REDUCTION SURGERY Left 11/06/2017   Procedure: BREAST REDUCTION;  Surgeon: Wallace Going, DO;  Location: [facility];  Service: Plastics;  Laterality: Left;  . BROW LIFT Bilateral 02/01/2015   Procedure: BLEPHAROPLASTY bilateral upper eyelids.;  Surgeon: Karle Starch, MD;  Location: [place];  Service: Ophthalmology;  Laterality: Bilateral;  DIABETIC - diet controlled  . CATARACT EXTRACTION W/PHACO Left 01/14/2019   Procedure: CATARACT EXTRACTION PHACO AND INTRAOCULAR LENS PLACEMENT (IOC) LEFT TECNIS TORIC ADD 5.62 00:46.5 30.0%;  Surgeon: Leandrew Koyanagi, MD;  Location: [facility];  Service: Ophthalmology;  Laterality:  Left;  diabetic - diet controlled  . CATARACT EXTRACTION W/PHACO Right 02/11/2019   Procedure: CATARACT EXTRACTION PHACO AND INTRAOCULAR LENS PLACEMENT (IOC) RIGHT DIABETIC 4.82 00:46.9 10.3%;  Surgeon: Leandrew Koyanagi, MD;  Location: [facility];  Service: Ophthalmology;  Laterality: Right;  Diabetic - diet controlled  . CHOLECYSTECTOMY    . COLONOSCOPY    . COLONOSCOPY WITH PROPOFOL N/A 05/11/2015   Procedure: COLONOSCOPY WITH PROPOFOL;  Surgeon: Manya Silvas, MD;  [facility];  Service: Endoscopy;  Laterality: N/A;  . ESOPHAGOGASTRODUODENOSCOPY    . ESOPHAGOGASTRODUODENOSCOPY (EGD) WITH PROPOFOL  05/11/2015   Procedure: ESOPHAGOGASTRODUODENOSCOPY (EGD) WITH PROPOFOL;  Surgeon: Manya Silvas, MD;  Location: [facility] ENDOSCOPY;  Service: Endoscopy;;  . FRACTURE SURGERY Right    arm and shoulder  . JOINT REPLACEMENT    . KNEE ARTHROSCOPY Left 03/14/2016   Procedure: ARTHROSCOPY KNEE, PARTIAL MEDIAL MENISECTOMY, CHONDROPLASTY;  Surgeon: Dereck Leep, MD;   Location: [facility];  Service: Orthopedics;  Laterality: Left;  Marland Kitchen MASTECTOMY Right 2019  . MASTECTOMY W/ SENTINEL NODE BIOPSY Right 06/03/2017   Procedure: MASTECTOMY WITH SENTINEL LYMPH NODE BIOPSY;  Surgeon: Robert Bellow, MD;  Location: [facility];  Service: General;  Laterality: Right;  . PARTIAL HIP ARTHROPLASTY Right   . REDUCTION MAMMAPLASTY Left 2019  . REMOVAL OF BILATERAL TISSUE EXPANDERS WITH PLACEMENT OF BILATERAL BREAST IMPLANTS Right 11/06/2017   Procedure: REMOVAL OF TISSUE EXPANDER WITH PLACEMENT OF BREAST IMPLANT;  Surgeon: Wallace Going, DO;  Location: [facility];  Service: Plastics;  Laterality: Right;  . TONSILLECTOMY      Her family history includes Atrial fibrillation in her father; Cancer in her brother and mother; Colon cancer in her mother; Diabetes in her father; Healthy in her son and son; Heart disease in her father; Hypertension in her father. There is no history of Breast cancer.   Current Outpatient Medications:  .  acetaminophen (TYLENOL) 500 MG tablet, Take 500 mg by mouth every 6 (six) hours as needed., Disp: , Rfl:  .  albuterol (PROVENTIL HFA;VENTOLIN HFA) 108 (90 Base) MCG/ACT inhaler, Inhale 2 puffs into the lungs every 6 (six) hours as needed for wheezing or shortness of breath., Disp: 1 Inhaler, Rfl: 2 .  ARIPiprazole (ABILIFY) 2 MG tablet, TAKE 1 TABLET (2 MG TOTAL) BY MOUTH EVERY EVENING., Disp: 90 tablet, Rfl: 1 .  aspirin 81 MG tablet, Take 81 mg by mouth daily., Disp: , Rfl:  .  atorvastatin (LIPITOR) 40 MG tablet, Take 1 tablet (40 mg total) by mouth daily., Disp: 90 tablet, Rfl: 3 .  bismuth subsalicylate (PEPTO BISMOL) 262 MG chewable tablet, Chew 262 mg by mouth as needed. **capsule**, Disp: , Rfl:  .  calcium carbonate (TUMS - DOSED IN MG ELEMENTAL CALCIUM) 500 MG chewable tablet, Chew 1 tablet by mouth as needed for indigestion or heartburn., Disp: , Rfl:  .  clobetasol ointment (TEMOVATE) 0.05 %, APPLY TO AFFECTED AREAS AT NIGHT AS  NEEDED, Disp: , Rfl:  .  ferrous sulfate 324 MG TBEC, Take 324 mg by mouth daily with breakfast., Disp: , Rfl:  .  fluocinonide (LIDEX) 0.05 % external solution, APPLY TO AFFECTED AREAS OF SCALP DAILY UNTIL CLEAR, THEN FOR FLARES, Disp: , Rfl:  .  lisinopril (ZESTRIL) 5 MG tablet, TAKE 1 TABLET BY MOUTH EVERY DAY, Disp: 90 tablet, Rfl: 1 .  Multiple Vitamins-Minerals (CENTRUM SILVER PO), Take 1 tablet by mouth daily., Disp: , Rfl:  .  omeprazole (PRILOSEC) 40 MG capsule, Take  40 mg by mouth 2 (two) times daily. , Disp: , Rfl:  .  oxybutynin (DITROPAN-XL) 10 MG 24 hr tablet, TAKE 1 TABLET BY MOUTH EVERYDAY AT BEDTIME, Disp: 90 tablet, Rfl: 1 .  tiotropium (SPIRIVA) 18 MCG inhalation capsule, Place 1 capsule (18 mcg total) into inhaler and inhale daily., Disp: 90 capsule, Rfl: 3 .  Venlafaxine HCl 225 MG TB24, TAKE 1 TABLET (225 MG TOTAL) BY MOUTH DAILY., Disp: 90 tablet, Rfl: 2  Patient Care Team: Virginia Crews, MD as PCP - General (Family Medicine) Watt Climes, PA as Physician Assistant (Physician Assistant) Marry Guan, Laurice Record, MD as Consulting Physician (Orthopedic Surgery) Pa, Yoder as Consulting Physician (Optometry) Byrnett, Forest Gleason, MD (General Surgery) Dasher, Rayvon Char, MD (Dermatology) Grace Isaac, MD as Consulting Physician (Cardiothoracic Surgery) Cammie Sickle, MD as Consulting Physician (Internal Medicine)     Objective:    Vitals: BP 136/86 (BP Location: Left Arm, Patient Position: Sitting, Cuff Size: Large)   Pulse 95   Temp 98.2 F (36.8 C) (Oral)   Resp 16   Ht 5\' 5"  (1.651 m)   Wt 190 lb (86.2 kg)   BMI 31.62 kg/m   Physical Exam Vitals reviewed.  Constitutional:      General: She is not in acute distress.    Appearance: Normal appearance. She is well-developed. She is not diaphoretic.  HENT:     Head: Normocephalic and atraumatic.     Right Ear: Tympanic membrane, ear canal and external ear normal.     Left Ear: Tympanic  membrane, ear canal and external ear normal.  Eyes:     General: No scleral icterus.    Conjunctiva/sclera: Conjunctivae normal.     Pupils: Pupils are equal, round, and reactive to light.  Neck:     Thyroid: No thyromegaly.  Cardiovascular:     Rate and Rhythm: Normal rate and regular rhythm.     Pulses: Normal pulses.     Heart sounds: Normal heart sounds. No murmur.  Pulmonary:     Effort: Pulmonary effort is normal. No respiratory distress.     Breath sounds: Normal breath sounds. No wheezing or rales.  Abdominal:     General: There is no distension.     Palpations: Abdomen is soft.     Tenderness: There is no abdominal tenderness.  Musculoskeletal:     Cervical back: Neck supple.     Right lower leg: No edema.     Left lower leg: No edema.     Comments: L knee: No effusion, good ROM, mild TTP on medial joint line  Lymphadenopathy:     Cervical: No cervical adenopathy.  Skin:    General: Skin is warm and dry.     Capillary Refill: Capillary refill takes less than 2 seconds.     Findings: No rash.  Neurological:     Mental Status: She is alert and oriented to person, place, and time. Mental status is at baseline.  Psychiatric:        Mood and Affect: Mood normal.        Behavior: Behavior normal.        Thought Content: Thought content normal.     Activities of Daily Living In your present state of health, do you have any difficulty performing the following activities: 03/16/2019 01/14/2019  Hearing? N N  Vision? N N  Difficulty concentrating or making decisions? N -  Walking or climbing stairs? Y N  Comment Due to  left knee pains from arthritis. -  Dressing or bathing? N N  Doing errands, shopping? N -  Preparing Food and eating ? N -  Using the Toilet? N -  In the past six months, have you accidently leaked urine? Y -  Comment Due to urgeny during the night. -  Do you have problems with loss of bowel control? N -  Managing your Medications? N -  Managing your  Finances? N -  Some recent data might be hidden    Fall Risk Assessment Fall Risk  03/16/2019 07/28/2018 07/08/2018 06/24/2018 03/12/2018  Falls in the past year? 0 0 0 1 0  Number falls in past yr: 0 0 - 0 -  Injury with Fall? 0 - - 0 -  Follow up - - Falls evaluation completed - -     Depression Screen PHQ 2/9 Scores 03/16/2019 03/12/2018 09/12/2017 06/12/2017  PHQ - 2 Score 1 2 2  0  PHQ- 9 Score - 9 7 3     6CIT Screen 03/12/2018  What Year? 0 points  What month? 0 points  What time? 0 points  Count back from 20 0 points  Months in reverse 2 points  Repeat phrase 0 points  Total Score 2       Assessment & Plan:    Annual Physical Reviewed patient's Family Medical History Reviewed and updated list of patient's medical providers Assessment of cognitive impairment was done Assessed patient's functional ability Established a written schedule for health screening [place] Completed and Reviewed  Exercise Activities and Dietary recommendations Goals    . DIET - INCREASE WATER INTAKE     Recommend to increase water intake to 4-6 glasses a day.        Immunization History  Administered Date(s) Administered  . Fluad Quad(high Dose 65+) 09/09/2018  . Influenza Split 10/24/2011  . Influenza, High Dose Seasonal PF 11/18/2013, 01/24/2016, 10/08/2016, 09/12/2017  . Influenza-Unspecified 10/24/2011, 10/14/2012, 11/18/2013  . Moderna SARS-COVID-2 Vaccination 01/30/2019  . Pneumococcal Conjugate-13 08/21/2013  . Pneumococcal Polysaccharide-23 01/24/2016  . Tdap 10/24/2011  . Zoster Recombinat (Shingrix) 04/01/2016, 08/14/2016    Health Maintenance  Topic Date Due  . HEMOGLOBIN A1C  03/17/2019  . OPHTHALMOLOGY EXAM  11/25/2019  . FOOT EXAM  03/15/2020  . DEXA SCAN  03/09/2021  . TETANUS/TDAP  10/23/2021  . INFLUENZA VACCINE  Completed  . PNA vac Low Risk Adult  Completed     Discussed health benefits of physical activity, and encouraged her to engage  in regular exercise appropriate for her age and condition.    ------------------------------------------------------------------------------------------------------------  Problem List Items Addressed This Visit      Cardiovascular and Mediastinum   Essential hypertension    Well controlled Continue current medications Recheck metabolic panel F/u in 6 months       Relevant Orders   Comprehensive metabolic panel     Respiratory   Emphysema lung (HCC)    Chronic and stable Continue Spiriva Albuterol prn mucinex prn F/u in 6-45months        Endocrine   Hyperlipidemia associated with type 2 diabetes mellitus ([place])    Previously well controlled Continue atorvastatin Repeat CMP and FLP Goal LDL <70      Subclinical hypothyroidism    Asymptomatic No indication for medication Recheck TSH and free T4      Relevant Orders   TSH + free T4   Type 2 diabetes mellitus ([place])    Chronic and previously well controlled Diet  controlled and on no meds UTD on vaccinations and screenings Foot exam completed today On ACEi and statin Recheck A1c F/u in 6 months      Relevant Orders   Hemoglobin A1c   Diabetic peripheral neuropathy associated with type 2 diabetes mellitus ([place])    Ongoing with intermittent numbness of bilateral toes Continue regular self-foot exams No medication indicated at this time Could consider gabapentin in the future        Musculoskeletal and Integument   Primary osteoarthritis of left knee    Chronic Previously improved with corticosteroid injection Will repeat today (see procedure notE) Discussed repeating no more than every 3 months as needed to help with funtion        Other   Clinical depression    Well controlled Continue Effexor and abilify at current doses      Class 1 obesity with serious comorbidity and body mass index (BMI) of 31.0 to 31.9 in adult    Discussed importance of healthy weight management Discussed diet and  exercise        Other Visit Diagnoses    Encounter for annual physical exam    -  Primary   Relevant Orders   Comprehensive metabolic panel   Hemoglobin A1c   Lipid panel   TSH + free T4      INJECTION: Patient was given informed consent,. Appropriate time out was taken. Area prepped and draped in usual sterile fashion. 1 cc of depo-medrol 40 mg/ml plus  4 cc of 1% lidocaine without epinephrine was injected into the L knee using a(n) anterolateral approach. The patient tolerated the procedure well. There were no complications. Post procedure instructions were given.    Return in about 6 months (around 09/16/2019) for chronic disease f/u.   The entirety of the information documented in the History of Present Illness, Review of Systems and Physical Exam were personally obtained by me. Portions of this information were initially documented by Lynford Humphrey, CMA and reviewed by me for thoroughness and accuracy.    Germaine Shenker, Dionne Bucy, MD MPH [facility]

## 2019-03-16 NOTE — Assessment & Plan Note (Signed)
Asymptomatic No indication for medication Recheck TSH and free T4

## 2019-03-16 NOTE — Assessment & Plan Note (Signed)
Discussed importance of healthy weight management Discussed diet and exercise  

## 2019-03-16 NOTE — Assessment & Plan Note (Signed)
Previously well controlled Continue atorvastatin Repeat CMP and FLP Goal LDL <70

## 2019-03-16 NOTE — Assessment & Plan Note (Signed)
Well controlled Continue current medications Recheck metabolic panel F/u in 6 months  

## 2019-03-19 ENCOUNTER — Other Ambulatory Visit: Payer: Self-pay | Admitting: *Deleted

## 2019-03-19 ENCOUNTER — Other Ambulatory Visit: Payer: Self-pay

## 2019-03-19 DIAGNOSIS — D500 Iron deficiency anemia secondary to blood loss (chronic): Secondary | ICD-10-CM

## 2019-03-19 LAB — OCCULT BLOOD X 1 CARD TO LAB, STOOL
Fecal Occult Bld: NEGATIVE
Fecal Occult Bld: NEGATIVE
Fecal Occult Bld: NEGATIVE

## 2019-03-23 DIAGNOSIS — Z961 Presence of intraocular lens: Secondary | ICD-10-CM | POA: Diagnosis not present

## 2019-04-30 ENCOUNTER — Other Ambulatory Visit: Payer: Self-pay | Admitting: General Surgery

## 2019-04-30 ENCOUNTER — Other Ambulatory Visit: Payer: Self-pay | Admitting: Family Medicine

## 2019-04-30 DIAGNOSIS — C50911 Malignant neoplasm of unspecified site of right female breast: Secondary | ICD-10-CM

## 2019-04-30 DIAGNOSIS — I10 Essential (primary) hypertension: Secondary | ICD-10-CM

## 2019-06-12 ENCOUNTER — Ambulatory Visit
Admission: RE | Admit: 2019-06-12 | Discharge: 2019-06-12 | Disposition: A | Discharge status: Home / Self Care | Payer: Medicare PPO | Source: Ambulatory Visit | Attending: General Surgery | Admitting: General Surgery

## 2019-06-12 DIAGNOSIS — C50911 Malignant neoplasm of unspecified site of right female breast: Secondary | ICD-10-CM | POA: Diagnosis not present

## 2019-06-12 DIAGNOSIS — Z1231 Encounter for screening mammogram for malignant neoplasm of breast: Secondary | ICD-10-CM | POA: Insufficient documentation

## 2019-06-18 DIAGNOSIS — Z853 Personal history of malignant neoplasm of breast: Secondary | ICD-10-CM | POA: Diagnosis not present

## 2019-06-20 ENCOUNTER — Other Ambulatory Visit: Payer: Self-pay | Admitting: Family Medicine

## 2019-06-20 NOTE — Telephone Encounter (Signed)
Requested Prescriptions  Pending Prescriptions Disp Refills  . Venlafaxine HCl 225 MG TB24 [Pharmacy Med Name: VENLAFAXINE HCL ER 225 MG TAB] 90 tablet 0    Sig: TAKE 1 TABLET (225 MG TOTAL) BY MOUTH DAILY.     Psychiatry: Antidepressants - SNRI - desvenlafaxine & venlafaxine Failed - 06/20/2019  9:43 AM      Failed - LDL in normal range and within 360 days    LDL Chol Calc (NIH)  Date Value Ref Range Status  09/17/2018 94 0 - 99 mg/dL Final         Failed - Triglycerides in normal range and within 360 days    Triglycerides  Date Value Ref Range Status  09/17/2018 256 (H) 0 - 149 mg/dL Final         Passed - Total Cholesterol in normal range and within 360 days    Cholesterol, Total  Date Value Ref Range Status  09/17/2018 179 100 - 199 mg/dL Final         Passed - Completed PHQ-2 or PHQ-9 in the last 360 days.      Passed - Last BP in normal range    BP Readings from Last 1 Encounters:  03/16/19 136/86         Passed - Valid encounter within last 6 months    Recent Outpatient Visits          3 months ago Encounter for annual physical exam   [facility] [place], Dionne Bucy, MD   9 months ago Pulmonary emphysema, unspecified emphysema type [facility])   [facility], Dionne Bucy, MD   12 months ago Primary osteoarthritis of left knee   [facility] [place], Dionne Bucy, MD   1 year ago Pain and swelling of left wrist   [facility] [place], Dionne Bucy, MD   1 year ago Pulmonary emphysema, unspecified emphysema type [facility])   [place], Dionne Bucy, MD      Future Appointments            In 2 months Bacigalupo, Dionne Bucy, MD [facility] [place]

## 2019-07-02 ENCOUNTER — Other Ambulatory Visit: Payer: Self-pay | Admitting: Cardiothoracic Surgery

## 2019-07-02 DIAGNOSIS — I712 Thoracic aortic aneurysm, without rupture, unspecified: Secondary | ICD-10-CM

## 2019-07-13 ENCOUNTER — Encounter: Payer: Self-pay | Admitting: *Deleted

## 2019-07-13 ENCOUNTER — Other Ambulatory Visit: Payer: Self-pay

## 2019-07-13 DIAGNOSIS — Z87891 Personal history of nicotine dependence: Secondary | ICD-10-CM | POA: Insufficient documentation

## 2019-07-13 DIAGNOSIS — I10 Essential (primary) hypertension: Secondary | ICD-10-CM | POA: Insufficient documentation

## 2019-07-13 DIAGNOSIS — M79662 Pain in left lower leg: Secondary | ICD-10-CM | POA: Diagnosis not present

## 2019-07-13 DIAGNOSIS — Z7982 Long term (current) use of aspirin: Secondary | ICD-10-CM | POA: Insufficient documentation

## 2019-07-13 DIAGNOSIS — E119 Type 2 diabetes mellitus without complications: Secondary | ICD-10-CM | POA: Diagnosis not present

## 2019-07-13 DIAGNOSIS — Z96659 Presence of unspecified artificial knee joint: Secondary | ICD-10-CM | POA: Diagnosis not present

## 2019-07-13 DIAGNOSIS — Z79899 Other long term (current) drug therapy: Secondary | ICD-10-CM | POA: Diagnosis not present

## 2019-07-13 DIAGNOSIS — M79605 Pain in left leg: Secondary | ICD-10-CM | POA: Insufficient documentation

## 2019-07-13 NOTE — ED Triage Notes (Addendum)
Pt reporting for two days she has had a pain in the upper left leg that radiates down through the knee and sometimes down her through her foot. Pt denies that she has had swelling, redness or injury to the extremity. Ambulatory to lobby with steady gait.

## 2019-07-14 ENCOUNTER — Emergency Department: Payer: Medicare PPO

## 2019-07-14 ENCOUNTER — Emergency Department
Admission: EM | Admit: 2019-07-14 | Discharge: 2019-07-14 | Disposition: A | Discharge status: Home / Self Care | Payer: Medicare PPO | Attending: Student in an Organized Health Care Education/Training Program | Admitting: Student in an Organized Health Care Education/Training Program

## 2019-07-14 DIAGNOSIS — M79662 Pain in left lower leg: Secondary | ICD-10-CM | POA: Diagnosis not present

## 2019-07-14 DIAGNOSIS — M79605 Pain in left leg: Secondary | ICD-10-CM | POA: Diagnosis not present

## 2019-07-14 MED ORDER — TRAMADOL HCL 50 MG PO TABS: 50.0000 mg | ORAL_TABLET | Freq: Four times a day (QID) | ORAL | 0 refills | Status: DC | PRN

## 2019-07-14 MED ORDER — ACETAMINOPHEN 325 MG PO TABS
650.0000 mg | ORAL_TABLET | Freq: Once | ORAL | Status: AC
  Administered 2019-07-14: 650 mg via ORAL

## 2019-07-14 MED ORDER — ACETAMINOPHEN 325 MG PO TABS
ORAL_TABLET | ORAL | Status: AC
  Filled 2019-07-14: qty 2

## 2019-07-14 NOTE — ED Provider Notes (Signed)
[facility] Emergency Department Provider Note  ____________________________________________   First MD Initiated Contact with Patient 07/14/19 [phone]     (approximate)  I have reviewed the triage vital signs and the nursing notes.   HISTORY  Chief Complaint Leg Pain    HPI Natasha Vasquez is a 79 y.o. female with medical history as listed below who presents for evaluation of acute onset left leg pain over the last 24 or so hours.  She said that it feels better when she is up walking around and feels worse when she lies down flat.  No swelling or redness.  No known trauma although she did try a new exercise/balance class a few days ago.  She said that the pain at various times may start up in her left groin but more commonly in the left upper thigh and radiate down her leg to her left calf.  She is able to ambulate without difficulty.  She has no numbness nor tingling or weakness in the extremity.  She has no history of blood clots in the legs of the lungs and does not take any anticoagulation.  No recent surgeries or immobilizations.  No recent long trips.         Past Medical History:  Diagnosis Date  . Anemia in pregnancy   . Arthritis    hands  . Barrett esophagus   . 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  ER/PR NEGATIVE  . Cataract   . Chronic cough   . Complication of anesthesia    20 years ago pts b/p dropped really low, "coded"  . Depression   . Depression   . Diabetes mellitus without complication (HCC)    diet controlled  . Family history of adverse reaction to anesthesia    son PONV  . GERD (gastroesophageal reflux disease)   . Hypercholesteremia   . Hyperlipidemia   . Hypertension   . Melanoma ([place])   . MVP (mitral valve prolapse)    followed by PCP  . Nephritis   . Nephritis   . S/P appendectomy   . Thoracic aortic aneurysm ([place])   . Torn meniscus    left    Patient Active Problem List    Diagnosis Date Noted  . Iron deficiency 10/07/2018  . Primary osteoarthritis of left knee 09/17/2018  . Emphysema lung ([place]) 03/14/2018  . Diabetic peripheral neuropathy associated with type 2 diabetes mellitus ([place]) 03/14/2018  . Metatarsalgia of both feet 03/14/2018  . Swelling of first metatarsophalangeal (MTP) joint 03/14/2018  . Abnormality of lung 03/14/2018  . Acquired absence of right breast 06/11/2017  . 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[place]) 05/09/2017  . 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 Dysphagia 03/11/2017  . Class 1 obesity with serious comorbidity and body mass index (BMI) of 31.0 to 31.9 in adult 03/11/2017  . Subclinical hypothyroidism 06/08/2015  . Osteopenia 06/08/2015  . Billowing mitral valve 06/08/2015  . Melanoma in situ ([place]) 06/08/2015  . Cramps of lower extremity 06/08/2015  . Arthritis 06/08/2015  . Microcytic anemia 06/08/2015  . Type 2 diabetes mellitus ([place]) 05/18/2015  . Colitis, enteritis, and gastroenteritis of presumed infectious origin 05/18/2015  . Closed fracture of neck of right femur ([place]) 03/25/2015  . Clinical depression 01/26/2015  . Hyperlipidemia associated with type 2 diabetes mellitus ([place]) 10/28/2014  . Essential hypertension 09/27/2014  . GERD (gastroesophageal reflux disease) 08/06/2014  .  Urinary frequency 07/16/2014  . Bell palsy 04/24/2004    Past Surgical History:  Procedure Laterality Date  . ABDOMINAL HYSTERECTOMY    . APPENDECTOMY    . BREAST BIOPSY Right 04/25/2017   retroareolar 10:00   wing clip   path pending  . BREAST BIOPSY Right 04/25/2017   10:00  5CMFN  venus clip    path pending  . BREAST BIOPSY Right 05/03/2017   Affirm Bx- path pending  . BREAST CYST ASPIRATION Bilateral    NEG  . BREAST EXCISIONAL BIOPSY Left 20+ yrs ago   NEG  . BREAST RECONSTRUCTION WITH PLACEMENT OF TISSUE EXPANDER AND FLEX HD (ACELLULAR HYDRATED DERMIS) Right 06/03/2017    Procedure: BREAST RECONSTRUCTION WITH PLACEMENT OF TISSUE EXPANDER AND FLEX HD (ACELLULAR HYDRATED DERMIS);  Surgeon: Wallace Going, DO;  Location: [facility];  Service: Plastics;  Laterality: Right;  . BREAST REDUCTION SURGERY Left 11/06/2017   Procedure: BREAST REDUCTION;  Surgeon: Wallace Going, DO;  Location: [facility];  Service: Plastics;  Laterality: Left;  . BROW LIFT Bilateral 02/01/2015   Procedure: BLEPHAROPLASTY bilateral upper eyelids.;  Surgeon: Karle Starch, MD;  Location: [facility];  Service: Ophthalmology;  Laterality: Bilateral;  DIABETIC - diet controlled  . CATARACT EXTRACTION W/PHACO Left 01/14/2019   Procedure: CATARACT EXTRACTION PHACO AND INTRAOCULAR LENS PLACEMENT (IOC) LEFT TECNIS TORIC ADD 5.62 00:46.5 30.0%;  Surgeon: Leandrew Koyanagi, MD;  Location: [facility];  Service: Ophthalmology;  Laterality: Left;  diabetic - diet controlled  . CATARACT EXTRACTION W/PHACO Right 02/11/2019   Procedure: CATARACT EXTRACTION PHACO AND INTRAOCULAR LENS PLACEMENT (IOC) RIGHT DIABETIC 4.82 00:46.9 10.3%;  Surgeon: Leandrew Koyanagi, MD;  Location: [facility];  Service: Ophthalmology;  Laterality: Right;  Diabetic - diet controlled  . CHOLECYSTECTOMY    . COLONOSCOPY    . COLONOSCOPY WITH PROPOFOL N/A 05/11/2015   Procedure: COLONOSCOPY WITH PROPOFOL;  Surgeon: Manya Silvas, MD;  Location: [facility] ENDOSCOPY;  Service: Endoscopy;  Laterality: N/A;  . ESOPHAGOGASTRODUODENOSCOPY    . ESOPHAGOGASTRODUODENOSCOPY (EGD) WITH PROPOFOL  05/11/2015   Procedure: ESOPHAGOGASTRODUODENOSCOPY (EGD) WITH PROPOFOL;  Surgeon: Manya Silvas, MD;  Location[facility];  Service: Endoscopy;;  . FRACTURE SURGERY Right    arm and shoulder  . JOINT REPLACEMENT    . KNEE ARTHROSCOPY Left 03/14/2016   Procedure: ARTHROSCOPY KNEE, PARTIAL MEDIAL MENISECTOMY, CHONDROPLASTY;  Surgeon: Dereck Leep, MD;  Location: [facility];  Service: Orthopedics;  Laterality: Left;    Marland Kitchen MASTECTOMY Right 2019  . MASTECTOMY W/ SENTINEL NODE BIOPSY Right 06/03/2017   Procedure: MASTECTOMY WITH SENTINEL LYMPH NODE BIOPSY;  Surgeon: Robert Bellow, MD;  Location: [facility];  Service: General;  Laterality: Right;  . PARTIAL HIP ARTHROPLASTY Right   . REDUCTION MAMMAPLASTY Left 2019  . REMOVAL OF BILATERAL TISSUE EXPANDERS WITH PLACEMENT OF BILATERAL BREAST IMPLANTS Right 11/06/2017   Procedure: REMOVAL OF TISSUE EXPANDER WITH PLACEMENT OF BREAST IMPLANT;  Surgeon: Wallace Going, DO;  Location: [facility];  Service: Plastics;  Laterality: Right;  . TONSILLECTOMY      Prior to Admission medications   Medication Sig Start Date End Date Taking? Authorizing Provider  acetaminophen (TYLENOL) 500 MG tablet Take 500 mg by mouth every 6 (six) hours as needed.    [provider]  albuterol (PROVENTIL HFA;VENTOLIN HFA) 108 (90 Base) MCG/ACT inhaler Inhale 2 puffs into the lungs every 6 (six) hours as needed for wheezing or shortness of breath. 03/14/18   Virginia Crews,  MD  ARIPiprazole (ABILIFY) 2 MG tablet TAKE 1 TABLET (2 MG TOTAL) BY MOUTH EVERY EVENING. 03/11/19   Birdie Sons, MD  aspirin 81 MG tablet Take 81 mg by mouth daily.    [provider]  atorvastatin (LIPITOR) 40 MG tablet Take 1 tablet (40 mg total) by mouth daily. 06/20/18   Bacigalupo, Dionne Bucy, MD  bismuth subsalicylate (PEPTO BISMOL) 262 MG chewable tablet Chew 262 mg by mouth as needed. **capsule**    [provider]  calcium carbonate (TUMS - DOSED IN MG ELEMENTAL CALCIUM) 500 MG chewable tablet Chew 1 tablet by mouth as needed for indigestion or heartburn.    [provider]  clobetasol ointment (TEMOVATE) 0.05 % APPLY TO AFFECTED AREAS AT NIGHT AS NEEDED 02/09/19   [provider]  ferrous sulfate 324 MG TBEC Take 324 mg by mouth daily with breakfast.    [provider]  fluocinonide (LIDEX) 0.05 % external solution APPLY TO AFFECTED AREAS OF SCALP  DAILY UNTIL CLEAR, THEN FOR FLARES 10/31/18   [provider]  lisinopril (ZESTRIL) 5 MG tablet TAKE 1 TABLET BY MOUTH EVERY DAY 04/30/19   Bacigalupo, Dionne Bucy, MD  Multiple Vitamins-Minerals (CENTRUM SILVER PO) Take 1 tablet by mouth daily.    [provider]  omeprazole (PRILOSEC) 40 MG capsule Take 40 mg by mouth 2 (two) times daily.     [provider]  oxybutynin (DITROPAN-XL) 10 MG 24 hr tablet TAKE 1 TABLET BY MOUTH EVERYDAY AT BEDTIME 01/29/19   Virginia Crews, MD  tiotropium (SPIRIVA) 18 MCG inhalation capsule Place 1 capsule (18 mcg total) into inhaler and inhale daily. 06/16/18   Virginia Crews, MD  Venlafaxine HCl 225 MG TB24 TAKE 1 TABLET (225 MG TOTAL) BY MOUTH DAILY. 06/20/19   Virginia Crews, MD    Allergies Codeine, Lorazepam, Oxycodone, Paregoric, Penicillins, Quinolones, and Aleve [naproxen sodium]  Family History  Problem Relation Age of Onset  . Cancer Mother        colon/rectal  . Colon cancer Mother   . Heart disease Father   . Hypertension Father   . Atrial fibrillation Father   . Diabetes Father   . Cancer Brother        lung  . Healthy Son   . Healthy Son   . Breast cancer Neg Hx     Social History Social History   Tobacco Use  . Smoking status: Former Smoker    Types: Cigarettes    Quit date: 1965    Years since quitting: 56.5  . Smokeless tobacco: Never Used  . Tobacco comment: smoked in college  Vaping Use  . Vaping Use: Never used  Substance Use Topics  . Alcohol use: Yes    Alcohol/week: 7.0 - 14.0 standard drinks    Types: 7 - 14 Glasses of wine per week    Comment: 1-2 glasses of wine per night  . Drug use: No    Review of Systems Constitutional: No fever/chills Eyes: No visual changes. ENT: No sore throat. Cardiovascular: Denies chest pain. Respiratory: Denies shortness of breath. Gastrointestinal: No abdominal pain.  No nausea, no vomiting.  No diarrhea.  No constipation. Genitourinary:  Negative for dysuria. Musculoskeletal: Pain in left leg as described above.  Negative for neck pain.  Negative for back pain. Integumentary: Negative for rash. Neurological: Negative for headaches, focal weakness or numbness.   ____________________________________________   PHYSICAL EXAM:  VITAL SIGNS: ED Triage Vitals [07/13/19 2257]  Enc  Vitals Group     BP (!) 157/87     Pulse Rate 88     Resp 16     Temp 98.2 F (36.8 C)     Temp Source Oral     SpO2 96 %     Weight      Height      Head Circumference      Peak Flow      Pain Score 5     Pain Loc      Pain Edu?      Excl. in [place]?     Constitutional: Alert and oriented.  Eyes: Conjunctivae are normal.  Head: Atraumatic. Nose: No congestion/rhinnorhea. Mouth/Throat: Patient is wearing a mask. Neck: No stridor.  No meningeal signs.   Cardiovascular: Normal rate, regular rhythm. Good peripheral circulation. Grossly normal heart sounds. Respiratory: Normal respiratory effort.  No retractions. Gastrointestinal: Soft and nontender. No distention.  Musculoskeletal: No lower extremity tenderness nor edema. No gross deformities of extremities.  No tenderness to palpation, no palpable abnormalities in the popliteal fossa. Neurologic:  Normal speech and language. No gross focal neurologic deficits are appreciated.  Skin:  Skin is warm, dry and intact.  No rash/erythema on the left lower extremity.  No petechiae. Psychiatric: Mood and affect are normal. Speech and behavior are normal.  ____________________________________________   LABS (all labs ordered are listed, but only abnormal results are displayed)  Labs Reviewed - No data to display ____________________________________________  EKG  No indication for EKG ____________________________________________  RADIOLOGY I, Hinda Kehr, personally viewed and evaluated these images (plain radiographs) as part of my medical decision making, as well as reviewing the  written report by the radiologist.  ED MD interpretation: Ultrasound of the left lower extremity to rule out DVT is pending at the time of signout.  Official radiology report(s): No results found.  ____________________________________________   PROCEDURES   Procedure(s) performed (including Critical Care):  Procedures   ____________________________________________   INITIAL IMPRESSION / MDM / [place] / ED COURSE  As part of my medical decision making, I reviewed the following data within the [place] notes reviewed and incorporated, Old chart reviewed and Notes from prior ED visits and signed patient out to Dr. Quentin Cornwall.   Differential diagnosis includes, but is not limited to, musculoskeletal strain, DVT.  Very low suspicion of bony injury.  The patient is ambulatory without difficulty and without gait disturbance.  No physical signs of DVT such as erythema or edema.  She has low risk.  However this is the most likely " emergent" cause of her pain.  I ordered the ultrasound to rule out DVT and she is comfortable with the plan for discharge and outpatient follow-up for musculoskeletal strain if the ultrasound is negative.  Transferring care to Dr. Quentin Cornwall to follow-up on the results and communicate with the patient.           ____________________________________________  FINAL CLINICAL IMPRESSION(S) / ED DIAGNOSES  Final diagnoses:  Left leg pain     MEDICATIONS GIVEN DURING THIS VISIT:  Medications  acetaminophen (TYLENOL) tablet 650 mg (650 mg Oral Given 07/14/19 0553)     ED Discharge Orders    None      *Please note:  Paxtyn Leslye Puccini was evaluated in Emergency Department on 07/14/2019 for the symptoms described in the history of present illness. She was evaluated in the context of the global COVID-19 pandemic, which necessitated consideration that the patient might be  at risk for infection with the SARS-CoV-2 virus  that causes COVID-19. Institutional protocols and algorithms that pertain to the evaluation of patients at risk for COVID-19 are in a state of rapid change based on information released by regulatory bodies including the CDC and federal and state organizations. These policies and algorithms were followed during the patient's care in the ED.  Some ED evaluations and interventions may be delayed as a result of limited staffing during and after the pandemic.*  Note:  This document was prepared using Dragon voice recognition software and may include unintentional dictation errors.   Hinda Kehr, MD 07/14/19 [phone]

## 2019-07-14 NOTE — ED Provider Notes (Signed)
Patient received in signout from Dr. Karma Greaser pending ultrasound.  Ultrasound is reassuring.  Patient states that she did feel like she might have overdone it during exercise over the weekend is still having achy pain.  She has good peripheral pulses.  Neuro intact.  Denies any back pain.  No red flag symptoms.  Discussed conservative measures and follow-up with PCP.   Merlyn Lot, MD 07/14/19 [phone]

## 2019-07-16 NOTE — Progress Notes (Deleted)
     Established patient visit   Patient: Natasha Vasquez   DOB: 1940-07-19   79 y.o. Female  MRN: [id number] Visit Date: 07/17/2019  Today's healthcare provider: Trinna Post, PA-C   No chief complaint on file.  Subjective    Leg Pain  The incident occurred 3 to 5 days ago.    ***  {Show patient history (optional):23778::" "}   Medications: Outpatient Medications Prior to Visit  Medication Sig  . acetaminophen (TYLENOL) 500 MG tablet Take 500 mg by mouth every 6 (six) hours as needed.  Marland Kitchen albuterol (PROVENTIL HFA;VENTOLIN HFA) 108 (90 Base) MCG/ACT inhaler Inhale 2 puffs into the lungs every 6 (six) hours as needed for wheezing or shortness of breath.  . ARIPiprazole (ABILIFY) 2 MG tablet TAKE 1 TABLET (2 MG TOTAL) BY MOUTH EVERY EVENING.  Marland Kitchen aspirin 81 MG tablet Take 81 mg by mouth daily.  Marland Kitchen atorvastatin (LIPITOR) 40 MG tablet Take 1 tablet (40 mg total) by mouth daily.  Marland Kitchen bismuth subsalicylate (PEPTO BISMOL) 262 MG chewable tablet Chew 262 mg by mouth as needed. **capsule**  . calcium carbonate (TUMS - DOSED IN MG ELEMENTAL CALCIUM) 500 MG chewable tablet Chew 1 tablet by mouth as needed for indigestion or heartburn.  . clobetasol ointment (TEMOVATE) 0.05 % APPLY TO AFFECTED AREAS AT NIGHT AS NEEDED  . ferrous sulfate 324 MG TBEC Take 324 mg by mouth daily with breakfast.  . fluocinonide (LIDEX) 0.05 % external solution APPLY TO AFFECTED AREAS OF SCALP DAILY UNTIL CLEAR, THEN FOR FLARES  . lisinopril (ZESTRIL) 5 MG tablet TAKE 1 TABLET BY MOUTH EVERY DAY  . Multiple Vitamins-Minerals (CENTRUM SILVER PO) Take 1 tablet by mouth daily.  Marland Kitchen omeprazole (PRILOSEC) 40 MG capsule Take 40 mg by mouth 2 (two) times daily.   Marland Kitchen oxybutynin (DITROPAN-XL) 10 MG 24 hr tablet TAKE 1 TABLET BY MOUTH EVERYDAY AT BEDTIME  . tiotropium (SPIRIVA) 18 MCG inhalation capsule Place 1 capsule (18 mcg total) into inhaler and inhale daily.  . traMADol (ULTRAM) 50 MG tablet Take 1 tablet (50 mg total)  by mouth every 6 (six) hours as needed.  . Venlafaxine HCl 225 MG TB24 TAKE 1 TABLET (225 MG TOTAL) BY MOUTH DAILY.   No facility-administered medications prior to visit.    Review of Systems  {Heme  Chem  Endocrine  Serology  Results Review (optional):23779::" "}  Objective    There were no vitals taken for this visit. {Show previous vital signs (optional):23777::" "}  Physical Exam  ***  No results found for any visits on 07/17/19.  Assessment & Plan     ***  No follow-ups on file.      {provider attestation***:1}   Paulene Floor  [facility] [phone] (phone) [phone] (fax)  [place]

## 2019-07-17 ENCOUNTER — Inpatient Hospital Stay: Payer: Medicare PPO | Admitting: Physician Assistant

## 2019-07-25 ENCOUNTER — Other Ambulatory Visit: Payer: Self-pay | Admitting: Family Medicine

## 2019-07-25 DIAGNOSIS — N3281 Overactive bladder: Secondary | ICD-10-CM

## 2019-07-25 NOTE — Telephone Encounter (Signed)
Requested Prescriptions  Pending Prescriptions Disp Refills  . oxybutynin (DITROPAN-XL) 10 MG 24 hr tablet [Pharmacy Med Name: OXYBUTYNIN CL ER 10 MG TABLET] 90 tablet 1    Sig: TAKE 1 TABLET BY MOUTH EVERYDAY AT BEDTIME     Urology:  Bladder Agents Passed - 07/25/2019  9:02 AM      Passed - Valid encounter within last 12 months    Recent Outpatient Visits          4 months ago Encounter for annual physical exam   [facility] [place], Dionne Bucy, MD   10 months ago Pulmonary emphysema, unspecified emphysema type [facility])   [facility], Dionne Bucy, MD   1 year ago Primary osteoarthritis of left knee   [facility], Dionne Bucy, MD   1 year ago Pain and swelling of left wrist   [facility] [place], Dionne Bucy, MD   1 year ago Pulmonary emphysema, unspecified emphysema type [facility])   [place], Dionne Bucy, MD      Future Appointments            In 1 month Bacigalupo, Dionne Bucy, MD [facility], [place]   In 7 months Bacigalupo, Dionne Bucy, MD [facility], [place]

## 2019-08-05 DIAGNOSIS — E039 Hypothyroidism, unspecified: Secondary | ICD-10-CM | POA: Diagnosis not present

## 2019-08-05 DIAGNOSIS — Z0000 Encounter for general adult medical examination without abnormal findings: Secondary | ICD-10-CM | POA: Diagnosis not present

## 2019-08-05 DIAGNOSIS — E7800 Pure hypercholesterolemia, unspecified: Secondary | ICD-10-CM | POA: Diagnosis not present

## 2019-08-05 DIAGNOSIS — I10 Essential (primary) hypertension: Secondary | ICD-10-CM | POA: Diagnosis not present

## 2019-08-05 DIAGNOSIS — E1142 Type 2 diabetes mellitus with diabetic polyneuropathy: Secondary | ICD-10-CM | POA: Diagnosis not present

## 2019-08-06 LAB — COMPREHENSIVE METABOLIC PANEL
ALT: 21 IU/L (ref 0–32)
AST: 17 IU/L (ref 0–40)
Albumin/Globulin Ratio: 2 (ref 1.2–2.2)
Albumin: 4.5 g/dL (ref 3.7–4.7)
Alkaline Phosphatase: 105 IU/L (ref 48–121)
BUN/Creatinine Ratio: 15 (ref 12–28)
BUN: 17 mg/dL (ref 8–27)
Bilirubin Total: 0.2 mg/dL (ref 0.0–1.2)
CO2: 23 mmol/L (ref 20–29)
Calcium: 9.8 mg/dL (ref 8.7–10.3)
Chloride: 104 mmol/L (ref 96–106)
Creatinine, Ser: 1.11 mg/dL — ABNORMAL HIGH (ref 0.57–1.00)
GFR calc Af Amer: 55 mL/min/{1.73_m2} — ABNORMAL LOW (ref >59)
GFR calc non Af Amer: 47 mL/min/{1.73_m2} — ABNORMAL LOW (ref >59)
Globulin, Total: 2.2 g/dL (ref 1.5–4.5)
Glucose: 135 mg/dL — ABNORMAL HIGH (ref 65–99)
Potassium: 4.7 mmol/L (ref 3.5–5.2)
Sodium: 144 mmol/L (ref 134–144)
Total Protein: 6.7 g/dL (ref 6.0–8.5)

## 2019-08-06 LAB — LIPID PANEL
Chol/HDL Ratio: 4.4 ratio (ref 0.0–4.4)
Cholesterol, Total: 174 mg/dL (ref 100–199)
HDL: 40 mg/dL (ref >39)
LDL Chol Calc (NIH): 96 mg/dL (ref 0–99)
Triglycerides: 223 mg/dL — ABNORMAL HIGH (ref 0–149)
VLDL Cholesterol Cal: 38 mg/dL (ref 5–40)

## 2019-08-06 LAB — TSH+FREE T4
Free T4: 1.03 ng/dL (ref 0.82–1.77)
TSH: 3.28 u[IU]/mL (ref 0.450–4.500)

## 2019-08-06 LAB — HEMOGLOBIN A1C
Est. average glucose Bld gHb Est-mCnc: 134 mg/dL
Hgb A1c MFr Bld: 6.3 % — ABNORMAL HIGH (ref 4.8–5.6)

## 2019-08-13 ENCOUNTER — Ambulatory Visit
Admission: RE | Admit: 2019-08-13 | Discharge: 2019-08-13 | Disposition: A | Discharge status: Home / Self Care | Payer: Medicare PPO | Source: Ambulatory Visit | Attending: Cardiothoracic Surgery | Admitting: Cardiothoracic Surgery

## 2019-08-13 ENCOUNTER — Ambulatory Visit (INDEPENDENT_AMBULATORY_CARE_PROVIDER_SITE_OTHER): Payer: Medicare PPO | Admitting: Cardiothoracic Surgery

## 2019-08-13 ENCOUNTER — Other Ambulatory Visit: Payer: Self-pay

## 2019-08-13 VITALS — BP 145/87 | HR 82 | Temp 97.9°F | Resp 20 | Ht 65.0 in | Wt 183.0 lb

## 2019-08-13 DIAGNOSIS — I700 Atherosclerosis of aorta: Secondary | ICD-10-CM | POA: Diagnosis not present

## 2019-08-13 DIAGNOSIS — I712 Thoracic aortic aneurysm, without rupture, unspecified: Secondary | ICD-10-CM

## 2019-08-13 DIAGNOSIS — Z9011 Acquired absence of right breast and nipple: Secondary | ICD-10-CM | POA: Diagnosis not present

## 2019-08-13 MED ORDER — IOPAMIDOL (ISOVUE-370) INJECTION 76%
75.0000 mL | Freq: Once | INTRAVENOUS | Status: AC | PRN
  Administered 2019-08-13: 75 mL via INTRAVENOUS

## 2019-08-13 NOTE — Progress Notes (Signed)
[address]       [place] [address]             [phone]                    Natasha Vasquez Medical Record #[id number] Date of Birth: 1940/06/15  Referring: Nestor Lewandowsky, MD Primary Care: Virginia Crews, MD Primary Cardiologist: No primary care provider on file.  Chief Complaint:    Chief Complaint  Patient presents with  . Thoracic Aortic Aneurysm    6 month f/u with CTA Chest    History of Present Illness:    Natasha Vasquez 79 y.o. female is seen in the office  today for incidental finding of dilated ascending aorta.  The patient had a CT scan in December 2019 because of respiratory symptoms, a follow-up scan was done in June and noted dilated ascending aorta.  Neither of the scans were with contrast.  The patient has a history of recently discovered breast cancer stage is not documented in the chart but she notes that she had a right mastectomy and had positive nodes  9 per the pathology report. 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 )  The patient has no previous cardiac history, she does have a history of diet-controlled diabetes and hypertension.  There is no family history of aortic dissection or aneurysm.  She notes her father died at age 68, had sudden death, brother died of lung cancer at age 67.  CT a of the chest was done 6 months ago and repeated today.  Current Activity/ Functional Status:  Patient is independent with mobility/ambulation, transfers, ADL's, IADL's.   Zubrod Score: At the time of surgery this patient's most appropriate activity status/level should be described as: []     0    Normal activity, no symptoms [x]     1    Restricted in physical strenuous activity but ambulatory, able to do out light work []     2    Ambulatory and capable of self care, unable to do work activities, up and about               >50 % of waking hours                               []     3    Only limited self care, in bed greater than 50% of waking hours []     4    Completely disabled, no self care, confined to bed or chair []     5    Moribund   Past Medical History:  Diagnosis Date  . Anemia in pregnancy   . Arthritis    hands  . Barrett esophagus   . 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  ER/PR NEGATIVE  . Cataract   . Chronic cough   . Complication of anesthesia    20 years ago pts b/p dropped really low, "coded"  . Depression   . Depression   . Diabetes mellitus without complication (HCC)    diet controlled  . Family history of adverse reaction to anesthesia    son PONV  . GERD (gastroesophageal reflux disease)   . Hypercholesteremia   . Hyperlipidemia   . Hypertension   .  Melanoma ([place])   . MVP (mitral valve prolapse)    followed by PCP  . Nephritis   . Nephritis   . S/P appendectomy   . Thoracic aortic aneurysm ([place])   . Torn meniscus    left    Past Surgical History:  Procedure Laterality Date  . ABDOMINAL HYSTERECTOMY    . APPENDECTOMY    . BREAST BIOPSY Right 04/25/2017   retroareolar 10:00   wing clip   path pending  . BREAST BIOPSY Right 04/25/2017   10:00  5CMFN  venus clip    path pending  . BREAST BIOPSY Right 05/03/2017   Affirm Bx- path pending  . BREAST CYST ASPIRATION Bilateral    NEG  . BREAST EXCISIONAL BIOPSY Left 20+ yrs ago   NEG  . BREAST RECONSTRUCTION WITH PLACEMENT OF TISSUE EXPANDER AND FLEX HD (ACELLULAR HYDRATED DERMIS) Right 06/03/2017   Procedure: BREAST RECONSTRUCTION WITH PLACEMENT OF TISSUE EXPANDER AND FLEX HD (ACELLULAR HYDRATED DERMIS);  Surgeon: Wallace Going, DO;  Location: [facility];  Service: Plastics;  Laterality: Right;  . BREAST REDUCTION SURGERY Left 11/06/2017   Procedure: BREAST REDUCTION;  Surgeon: Wallace Going, DO;  Location: [facility];  Service: Plastics;  Laterality: Left;  . BROW LIFT Bilateral 02/01/2015   Procedure:  BLEPHAROPLASTY bilateral upper eyelids.;  Surgeon: Karle Starch, MD;  Location: [facility];  Service: Ophthalmology;  Laterality: Bilateral;  DIABETIC - diet controlled  . CATARACT EXTRACTION W/PHACO Left 01/14/2019   Procedure: CATARACT EXTRACTION PHACO AND INTRAOCULAR LENS PLACEMENT (IOC) LEFT TECNIS TORIC ADD 5.62 00:46.5 30.0%;  Surgeon: Leandrew Koyanagi, MD;  Location: [facility];  Service: Ophthalmology;  Laterality: Left;  diabetic - diet controlled  . CATARACT EXTRACTION W/PHACO Right 02/11/2019   Procedure: CATARACT EXTRACTION PHACO AND INTRAOCULAR LENS PLACEMENT (IOC) RIGHT DIABETIC 4.82 00:46.9 10.3%;  Surgeon: Leandrew Koyanagi, MD;  Location: [facility];  Service: Ophthalmology;  Laterality: Right;  Diabetic - diet controlled  . CHOLECYSTECTOMY    . COLONOSCOPY    . COLONOSCOPY WITH PROPOFOL N/A 05/11/2015   Procedure: COLONOSCOPY WITH PROPOFOL;  Surgeon: Manya Silvas, MD;  Location: [facility];  Service: Endoscopy;  Laterality: N/A;  . ESOPHAGOGASTRODUODENOSCOPY    . ESOPHAGOGASTRODUODENOSCOPY (EGD) WITH PROPOFOL  05/11/2015   Procedure: ESOPHAGOGASTRODUODENOSCOPY (EGD) WITH PROPOFOL;  Surgeon: Manya Silvas, MD;  Location: [facility];  Service: Endoscopy;;  . FRACTURE SURGERY Right    arm and shoulder  . JOINT REPLACEMENT    . KNEE ARTHROSCOPY Left 03/14/2016   Procedure: ARTHROSCOPY KNEE, PARTIAL MEDIAL MENISECTOMY, CHONDROPLASTY;  Surgeon: Dereck Leep, MD;  Location: [facility];  Service: Orthopedics;  Laterality: Left;  Marland Kitchen MASTECTOMY Right 2019  . MASTECTOMY W/ SENTINEL NODE BIOPSY Right 06/03/2017   Procedure: MASTECTOMY WITH SENTINEL LYMPH NODE BIOPSY;  Surgeon: Robert Bellow, MD;  Location: [facility];  Service: General;  Laterality: Right;  . PARTIAL HIP ARTHROPLASTY Right   . REDUCTION MAMMAPLASTY Left 2019  . REMOVAL OF BILATERAL TISSUE EXPANDERS WITH PLACEMENT OF BILATERAL BREAST IMPLANTS Right 11/06/2017   Procedure:  REMOVAL OF TISSUE EXPANDER WITH PLACEMENT OF BREAST IMPLANT;  Surgeon: Wallace Going, DO;  Location: [facility];  Service: Plastics;  Laterality: Right;  . TONSILLECTOMY      Family History  Problem Relation Age of Onset  . Cancer Mother        colon/rectal  . Colon cancer Mother   . Heart disease Father   . Hypertension Father   .  Atrial fibrillation Father   . Diabetes Father   . Cancer Brother        lung  . Healthy Son   . Healthy Son   . Breast cancer Neg Hx      Social History   Tobacco Use  Smoking Status Former Smoker  . Types: Cigarettes  . Quit date: 74  . Years since quitting: 56.6  Smokeless Tobacco Never Used  Tobacco Comment   smoked in college    Social History   Substance and Sexual Activity  Alcohol Use Yes  . Alcohol/week: 7.0 - 14.0 standard drinks  . Types: 7 - 14 Glasses of wine per week   Comment: 1-2 glasses of wine per night     Allergies  Allergen Reactions  . Codeine Other (See Comments)    Chest pain  . Lorazepam Other (See Comments)    "Feels loopy"  . Oxycodone Other (See Comments)    MENTAL CHANGES  . Paregoric     Chest pain---tolerated MORPHINE  . Penicillins Hives    Has patient had a PCN reaction causing immediate rash, facial/tongue/throat swelling, SOB or lightheadedness with hypotension: Yes Has patient had a PCN reaction causing severe rash involving mucus membranes or skin necrosis: No Has patient had a PCN reaction that required hospitalization: NO Has patient had a PCN reaction occurring within the last 10 years: No If all of the above answers are "NO", then may proceed with Cephalosporin use.  Can take Augmentin  . Quinolones     PATIENT UNAWARE OF allergy to this class of antibiotics, only aware of allergy to [facility] Patient was warned about not using Cipro and similar antibiotics. Recent studies have raised concern that fluoroquinolone antibiotics could be associated with an increased risk of aortic  aneurysm Fluoroquinolones have non-antimicrobial properties that might jeopardise the integrity of the extracellular matrix of the vascular wall In a  propensity score matched cohort study in [place], there was a 66% increased rate  . Aleve [Naproxen Sodium] Hives, Rash and Other (See Comments)    headaches    Current Outpatient Medications  Medication Sig Dispense Refill  . acetaminophen (TYLENOL) 500 MG tablet Take 500 mg by mouth every 6 (six) hours as needed.    Marland Kitchen albuterol (PROVENTIL HFA;VENTOLIN HFA) 108 (90 Base) MCG/ACT inhaler Inhale 2 puffs into the lungs every 6 (six) hours as needed for wheezing or shortness of breath. 1 Inhaler 2  . ARIPiprazole (ABILIFY) 2 MG tablet TAKE 1 TABLET (2 MG TOTAL) BY MOUTH EVERY EVENING. 90 tablet 1  . aspirin 81 MG tablet Take 81 mg by mouth daily.    Marland Kitchen atorvastatin (LIPITOR) 40 MG tablet Take 1 tablet (40 mg total) by mouth daily. 90 tablet 3  . bismuth subsalicylate (PEPTO BISMOL) 262 MG chewable tablet Chew 262 mg by mouth as needed. **capsule**    . calcium carbonate (TUMS - DOSED IN MG ELEMENTAL CALCIUM) 500 MG chewable tablet Chew 1 tablet by mouth as needed for indigestion or heartburn.    . clobetasol ointment (TEMOVATE) 0.05 % APPLY TO AFFECTED AREAS AT NIGHT AS NEEDED    . ferrous sulfate 324 MG TBEC Take 324 mg by mouth daily with breakfast.    . fluocinonide (LIDEX) 0.05 % external solution APPLY TO AFFECTED AREAS OF SCALP DAILY UNTIL CLEAR, THEN FOR FLARES    . lisinopril (ZESTRIL) 5 MG tablet TAKE 1 TABLET BY MOUTH EVERY DAY 90 tablet 1  . Multiple Vitamins-Minerals (CENTRUM SILVER PO) Take  1 tablet by mouth daily.    Marland Kitchen omeprazole (PRILOSEC) 40 MG capsule Take 40 mg by mouth 2 (two) times daily.     Marland Kitchen oxybutynin (DITROPAN-XL) 10 MG 24 hr tablet TAKE 1 TABLET BY MOUTH EVERYDAY AT BEDTIME 90 tablet 1  . tiotropium (SPIRIVA) 18 MCG inhalation capsule Place 1 capsule (18 mcg total) into inhaler and inhale daily. 90 capsule 3  . traMADol  (ULTRAM) 50 MG tablet Take 1 tablet (50 mg total) by mouth every 6 (six) hours as needed. 8 tablet 0  . Venlafaxine HCl 225 MG TB24 TAKE 1 TABLET (225 MG TOTAL) BY MOUTH DAILY. 90 tablet 0   No current facility-administered medications for this visit.    Pertinent items are noted in HPI.   Review of Systems:     Cardiac Review of Systems: [Y] = yes  or   [ N ] = no   Chest Pain Aqua.Slicker    ]  Resting SOB [ N  ] Exertional SOB  Aqua.Slicker ]  Vertell Limber Aqua.Slicker  ]   Pedal Edema [ N ]    Palpitations [N] Syncope  Aqua.Slicker ]   Presyncope Aqua.Slicker  ]   General Review of Systems: [Y] = yes [  ]=no Constitional: recent weight change [  ];  Wt loss over the last 3 months [   ] anorexia [  ]; fatigue [  ]; nausea [  ]; night sweats [  ]; fever [  ]; or chills [  ];           Eye : blurred vision [  ]; diplopia [   ]; vision changes [  ];  Amaurosis fugax[  ]; Resp: cough [  ];  wheezing[  ];  hemoptysis[  ]; shortness of breath[  ]; paroxysmal nocturnal dyspnea[  ]; dyspnea on exertion[  ]; or orthopnea[  ];  GI:  gallstones[  ], vomiting[  ];  dysphagia[  ]; melena[  ];  hematochezia [  ]; heartburn[  ];   Hx of  Colonoscopy[  ]; GU: kidney stones [  ]; hematuria[  ];   dysuria [  ];  nocturia[  ];  history of     obstruction [  ]; urinary frequency [  ]             Skin: rash, swelling[  ];, hair loss[  ];  peripheral edema[  ];  or itching[  ]; Musculosketetal: myalgias[  ];  joint swelling[  ];  joint erythema[  ];  joint pain[  ];  back pain[  ];  Heme/Lymph: bruising[  ];  bleeding[  ];  anemia[  ];  Neuro: TIA[  ];  headaches[  ];  stroke[  ];  vertigo[  ];  seizures[  ];   paresthesias[  ];  difficulty walking[  ];  Psych:depression[  ]; anxiety[  ];  Endocrine: diabetes[  ];  thyroid dysfunction[  ];  Immunizations: Flu up to date [  ]; Pneumococcal up to date [  ];  Other:     PHYSICAL EXAMINATION: BP (!) 145/87   Pulse 82   Temp 97.9 F (36.6 C) (Skin)   Resp 20   Ht 5\' 5"  (1.651 m)   Wt 183 lb (83 kg)    SpO2 98% Comment: RA  BMI 30.45 kg/m  General appearance: alert, cooperative and no distress Head: Normocephalic, without obvious abnormality, atraumatic Neck: no adenopathy, no carotid bruit, no JVD, supple, symmetrical, trachea  midline and thyroid not enlarged, symmetric, no tenderness/mass/nodules Resp: clear to auscultation bilaterally Cardio: regular rate and rhythm, S1, S2 normal, no murmur, click, rub or gallop GI: soft, non-tender; bowel sounds normal; no masses,  no organomegaly Extremities: extremities normal, atraumatic, no cyanosis or edema and Homans sign is negative, no sign of DVT Neurologic: Grossly normal  Diagnostic Studies & Laboratory data:     Recent Radiology Findings:  CT ANGIO CHEST AORTA W/CM & OR WO/CM  Result Date: 08/13/2019 CLINICAL DATA:  79 year old female with follow-up thoracic aortic aneurysm. EXAM: CT ANGIOGRAPHY CHEST WITH CONTRAST TECHNIQUE: Multidetector CT imaging of the chest was performed using the standard protocol during bolus administration of intravenous contrast. Multiplanar CT image reconstructions and MIPs were obtained to evaluate the vascular anatomy. CONTRAST:  61mL ISOVUE-370 IOPAMIDOL (ISOVUE-370) INJECTION 76% COMPARISON:  Chest CT dated 10/17/2018. FINDINGS: Cardiovascular: There is no cardiomegaly or pericardial effusion. Mildly dilated ascending thoracic aorta measuring up to 4.2 cm in diameter. No interval change. No dissection. There is mild atherosclerotic calcification of the thoracic aorta. The origins of the great vessels of the aortic arch appear patent. The central pulmonary arteries are patent. Mediastinum/Nodes: There is no hilar or mediastinal adenopathy. The esophagus is grossly unremarkable. No mediastinal fluid collection. Lungs/Pleura: The lungs are clear. There is no pleural effusion pneumothorax. The central airways are patent. Upper Abdomen: No acute abnormality. Musculoskeletal: Right mastectomy and implant. No acute osseous  pathology. Review of the MIP images confirms the above findings. IMPRESSION: 1. No acute intrathoracic pathology. 2. Stable mild dilatation of the ascending thoracic aorta measuring up to 4.2 cm in diameter. Recommend annual imaging followup by CTA or MRA. This recommendation follows 2010 ACCF/AHA/AATS/ACR/ASA/SCA/SCAI/SIR/STS/SVM Guidelines for the Diagnosis and Management of Patients with Thoracic Aortic Disease. Circulation. 2010; 121: C144-Y185. Aortic aneurysm NOS (ICD10-I71.9) 3. Aortic Atherosclerosis (ICD10-I70.0). Electronically Signed   By: Anner Crete M.D.   On: 08/13/2019 16:00    CLINICAL DATA:  Pulmonary nodules.  EXAM: CT CHEST WITHOUT CONTRAST  TECHNIQUE: Multidetector CT imaging of the chest was performed following the standard protocol without IV contrast.  COMPARISON:  01/23/2018.  FINDINGS: Cardiovascular: There is aneurysmal dilatation of the ascending aorta, currently measuring 4.6 cm. The heart size is normal. Coronary artery calcifications and thoracic aortic calcifications are noted.  Mediastinum/Nodes: No enlarged mediastinal or axillary lymph nodes. Thyroid gland, trachea, and esophagus demonstrate no significant findings.  Lungs/Pleura: There are a few small 3-4 mm pulmonary nodules in the right upper lobe which are stable from prior study (axial series 3, image 45). There is a stable branching opacity in the right middle lobe, consistent with mucous plugging. There is a stable 4 mm pulmonary nodule in the left peripheral upper lobe. There is improved aeration at the left lung base with near complete resolution of the previously demonstrated area of consolidation. There are few scattered calcified granulomas bilaterally.  Upper Abdomen: There is a small hiatal hernia. The patient appears to be status post cholecystectomy. There is stable dilatation of the common bile duct.  Musculoskeletal: A right-sided breast implant is noted. There  is diffuse skin thickening over the right breast implant. This appears stable from prior study. There is a stable nodular opacity in the left breast.  IMPRESSION: 1. Near complete resolution of the previously demonstrated opacity at the left lung base, consistent with a benign etiology. 2. Stable bilateral pulmonary nodules measuring up to approximately 3-4 mm. These can be evaluated on subsequent follow-up studies for the patient's thoracic aortic  aneurysm detailed below. 3. Ascending thoracic aortic aneurysm measuring approximately 4.6 cm in diameter. Ascending thoracic aortic aneurysm. Recommend semi-annual imaging followup by CTA or MRA and referral to cardiothoracic surgery if not already obtained. This recommendation follows 2010 ACCF/AHA/AATS/ACR/ASA/SCA/SCAI/SIR/STS/SVM Guidelines for the Diagnosis and Management of Patients With Thoracic Aortic Disease. Circulation. 2010; 121: Y850-Y774. Aortic aneurysm NOS (ICD10-I71.9) 4. Unchanged skin thickening overlying the patient's right breast implant. Stable left breast nodular opacities are again noted. Correlation with recent mammography is recommended.  Aortic Atherosclerosis (ICD10-I70.0).   Electronically Signed   By: Constance Holster M.D.   On: 06/27/2018 21:36 I have independently reviewed the above radiology studies  and reviewed the findings with the patient.   Echo 01/2019: 1. Left ventricular ejection fraction, by visual estimation, is 60 to 65%. The left ventricle has normal function. There is mildly increased left ventricular hypertrophy. 2. Elevated left atrial and left ventricular end-diastolic pressures. 3. Left ventricular diastolic parameters are consistent with Grade I diastolic dysfunction (impaired relaxation). 4. The left ventricle has no regional wall motion abnormalities. 5. Global right ventricle has normal systolic function.The right ventricular size is normal. No increase in right ventricular  wall thickness. 6. Left atrial size was normal. 7. Right atrial size was normal. 8. The mitral valve is grossly normal. Trivial mitral valve regurgitation. 9. The tricuspid valve is grossly normal. 10. The aortic valve is tricuspid. Aortic valve regurgitation is not visualized. 11. The pulmonic valve was grossly normal. Pulmonic valve regurgitation is not visualized. 12. Aortic dilatation noted. 13. There is mild dilatation of the ascending aorta measuring 41 mm. 14. The inferior vena cava is normal in size with greater than 50% respiratory variability, suggesting right atrial pressure of 3 mmHg. 15. The interatrial septum was not well visualized.     Recent Lab Findings: Lab Results  Component Value Date   WBC 6.8 03/02/2019   HGB 12.4 03/02/2019   HCT 40.0 03/02/2019   PLT 326 03/02/2019   GLUCOSE 135 (H) 08/05/2019   CHOL 174 08/05/2019   TRIG 223 (H) 08/05/2019   HDL 40 08/05/2019   LDLCALC 96 08/05/2019   ALT 21 08/05/2019   AST 17 08/05/2019   NA 144 08/05/2019   K 4.7 08/05/2019   CL 104 08/05/2019   CREATININE 1.11 (H) 08/05/2019   BUN 17 08/05/2019   CO2 23 08/05/2019   TSH 3.280 08/05/2019   HGBA1C 6.3 (H) 08/05/2019   Aortic Size Index=     4.2    /Body surface area is 1.95 meters squared. = 2.156  < 2.75 cm/m2      4% risk per year 2.75 to 4.25          8% risk per year > 4.25 cm/m2    20% risk per year     Assessment / Plan:   #1 patient with dilated ascending aorta approximately 4.2cm, without aortic stenosis or aortic insufficiency on physical exam, echocardiogram confirms a trileaflet aortic valve without significant valvular disease mitral or aortic.     We will plan follow-up CTA of the chest in 12 months.  Grace Isaac MD      [address] [place] [address] Office [phone]   [place] [phone]  08/13/2019 4:05 PM

## 2019-08-17 ENCOUNTER — Encounter: Payer: Self-pay | Admitting: Cardiothoracic Surgery

## 2019-08-24 ENCOUNTER — Telehealth: Payer: Self-pay

## 2019-08-24 NOTE — Telephone Encounter (Signed)
Patient advised as below. Patient verbalizes understanding and is in agreement with treatment plan.  

## 2019-08-24 NOTE — Telephone Encounter (Signed)
Patient advised as below. Patient reports she is taking atorvastatin daily.

## 2019-08-24 NOTE — Telephone Encounter (Signed)
-----   Message from Virginia Crews, MD sent at 08/24/2019 10:48 AM EDT ----- Normal/stable labs, except slight elevation of creatinine, kidney function.  Recommend holding NSAIDs like ibuprofen and Aleve.  We will recheck at upcoming visit.  A1c is well controlled.  Cholesterol is not at goal in the setting of diabetes.  She taking atorvastatin regularly?

## 2019-08-24 NOTE — Telephone Encounter (Signed)
We will discuss possible increase in dose at upcoming appointment.

## 2019-08-31 ENCOUNTER — Other Ambulatory Visit: Payer: Medicare PPO

## 2019-08-31 ENCOUNTER — Ambulatory Visit: Payer: Medicare PPO

## 2019-08-31 ENCOUNTER — Ambulatory Visit: Payer: Medicare PPO | Admitting: Internal Medicine

## 2019-09-03 ENCOUNTER — Other Ambulatory Visit: Payer: Self-pay | Admitting: Family Medicine

## 2019-09-03 DIAGNOSIS — E7800 Pure hypercholesterolemia, unspecified: Secondary | ICD-10-CM

## 2019-09-03 DIAGNOSIS — F4322 Adjustment disorder with anxiety: Secondary | ICD-10-CM

## 2019-09-03 NOTE — Telephone Encounter (Signed)
Requested Prescriptions  Pending Prescriptions Disp Refills  . atorvastatin (LIPITOR) 40 MG tablet [Pharmacy Med Name: ATORVASTATIN 40 MG TABLET] 90 tablet 3    Sig: TAKE 1 TABLET BY MOUTH EVERY DAY     Cardiovascular:  Antilipid - Statins Failed - 09/03/2019  1:48 AM      Failed - LDL in normal range and within 360 days    LDL Chol Calc (NIH)  Date Value Ref Range Status  08/05/2019 96 0 - 99 mg/dL Final         Failed - Triglycerides in normal range and within 360 days    Triglycerides  Date Value Ref Range Status  08/05/2019 223 (H) 0 - 149 mg/dL Final         Passed - Total Cholesterol in normal range and within 360 days    Cholesterol, Total  Date Value Ref Range Status  08/05/2019 174 100 - 199 mg/dL Final         Passed - HDL in normal range and within 360 days    HDL  Date Value Ref Range Status  08/05/2019 40 >39 mg/dL Final         Passed - Patient is not pregnant      Passed - Valid encounter within last 12 months    Recent Outpatient Visits          5 months ago Encounter for annual physical exam   [facility], Dionne Bucy, MD   11 months ago Pulmonary emphysema, unspecified emphysema type [facility])   [facility], Dionne Bucy, MD   1 year ago Primary osteoarthritis of left knee   [facility], Dionne Bucy, MD   1 year ago Pain and swelling of left wrist   [facility], Dionne Bucy, MD   1 year ago Pulmonary emphysema, unspecified emphysema type [facility])   [place], Dionne Bucy, MD      Future Appointments            In 1 week Bacigalupo, Dionne Bucy, MD [facility], [place]   In 6 months Bacigalupo, Dionne Bucy, MD [facility], [place]

## 2019-09-03 NOTE — Telephone Encounter (Signed)
Requested medications are due for refill today?  Yes - This medication refill cannot be delegated.    Requested medications are on active medication list?  Yes  Last Refill:   03/11/2019  # 90 with one refill.    Future visit scheduled?  Yes  Notes to Clinic:  This medication refill cannot be delegated.

## 2019-09-04 ENCOUNTER — Encounter: Payer: Self-pay | Admitting: Internal Medicine

## 2019-09-04 ENCOUNTER — Inpatient Hospital Stay: Payer: Medicare PPO | Attending: Internal Medicine

## 2019-09-04 ENCOUNTER — Inpatient Hospital Stay: Payer: Medicare PPO

## 2019-09-04 ENCOUNTER — Inpatient Hospital Stay: Payer: Medicare PPO | Admitting: Internal Medicine

## 2019-09-04 ENCOUNTER — Other Ambulatory Visit: Payer: Self-pay

## 2019-09-04 DIAGNOSIS — Z7982 Long term (current) use of aspirin: Secondary | ICD-10-CM | POA: Insufficient documentation

## 2019-09-04 DIAGNOSIS — Z9011 Acquired absence of right breast and nipple: Secondary | ICD-10-CM | POA: Diagnosis not present

## 2019-09-04 DIAGNOSIS — Z171 Estrogen receptor negative status [ER-]: Secondary | ICD-10-CM | POA: Insufficient documentation

## 2019-09-04 DIAGNOSIS — Z9049 Acquired absence of other specified parts of digestive tract: Secondary | ICD-10-CM | POA: Insufficient documentation

## 2019-09-04 DIAGNOSIS — I10 Essential (primary) hypertension: Secondary | ICD-10-CM | POA: Insufficient documentation

## 2019-09-04 DIAGNOSIS — E119 Type 2 diabetes mellitus without complications: Secondary | ICD-10-CM | POA: Diagnosis not present

## 2019-09-04 DIAGNOSIS — E7800 Pure hypercholesterolemia, unspecified: Secondary | ICD-10-CM | POA: Insufficient documentation

## 2019-09-04 DIAGNOSIS — D509 Iron deficiency anemia, unspecified: Secondary | ICD-10-CM | POA: Insufficient documentation

## 2019-09-04 DIAGNOSIS — Z79899 Other long term (current) drug therapy: Secondary | ICD-10-CM | POA: Diagnosis not present

## 2019-09-04 DIAGNOSIS — Z8249 Family history of ischemic heart disease and other diseases of the circulatory system: Secondary | ICD-10-CM | POA: Diagnosis not present

## 2019-09-04 DIAGNOSIS — E611 Iron deficiency: Secondary | ICD-10-CM

## 2019-09-04 DIAGNOSIS — Z801 Family history of malignant neoplasm of trachea, bronchus and lung: Secondary | ICD-10-CM | POA: Diagnosis not present

## 2019-09-04 DIAGNOSIS — Z90710 Acquired absence of both cervix and uterus: Secondary | ICD-10-CM | POA: Insufficient documentation

## 2019-09-04 DIAGNOSIS — Z87891 Personal history of nicotine dependence: Secondary | ICD-10-CM | POA: Diagnosis not present

## 2019-09-04 DIAGNOSIS — Z85820 Personal history of malignant melanoma of skin: Secondary | ICD-10-CM | POA: Insufficient documentation

## 2019-09-04 DIAGNOSIS — Z853 Personal history of malignant neoplasm of breast: Secondary | ICD-10-CM | POA: Insufficient documentation

## 2019-09-04 DIAGNOSIS — Z800 Family history of malignant neoplasm of digestive organs: Secondary | ICD-10-CM | POA: Insufficient documentation

## 2019-09-04 DIAGNOSIS — Z833 Family history of diabetes mellitus: Secondary | ICD-10-CM | POA: Insufficient documentation

## 2019-09-04 DIAGNOSIS — E785 Hyperlipidemia, unspecified: Secondary | ICD-10-CM | POA: Insufficient documentation

## 2019-09-04 DIAGNOSIS — F329 Major depressive disorder, single episode, unspecified: Secondary | ICD-10-CM | POA: Diagnosis not present

## 2019-09-04 LAB — CBC WITH DIFFERENTIAL/PLATELET
Abs Immature Granulocytes: 0.07 10*3/uL (ref 0.00–0.07)
Basophils Absolute: 0.1 10*3/uL (ref 0.0–0.1)
Basophils Relative: 1 %
Eosinophils Absolute: 0.3 10*3/uL (ref 0.0–0.5)
Eosinophils Relative: 3 %
HCT: 38.7 % (ref 36.0–46.0)
Hemoglobin: 12.9 g/dL (ref 12.0–15.0)
Immature Granulocytes: 1 %
Lymphocytes Relative: 21 %
Lymphs Abs: 1.9 10*3/uL (ref 0.7–4.0)
MCH: 28.5 pg (ref 26.0–34.0)
MCHC: 33.3 g/dL (ref 30.0–36.0)
MCV: 85.6 fL (ref 80.0–100.0)
Monocytes Absolute: 0.6 10*3/uL (ref 0.1–1.0)
Monocytes Relative: 7 %
Neutro Abs: 6.1 10*3/uL (ref 1.7–7.7)
Neutrophils Relative %: 67 %
Platelets: 331 10*3/uL (ref 150–400)
RBC: 4.52 MIL/uL (ref 3.87–5.11)
RDW: 14.9 % (ref 11.5–15.5)
WBC: 9.1 10*3/uL (ref 4.0–10.5)
nRBC: 0 % (ref 0.0–0.2)

## 2019-09-04 LAB — IRON AND TIBC
Iron: 41 ug/dL (ref 28–170)
Saturation Ratios: 13 % (ref 10.4–31.8)
TIBC: 319 ug/dL (ref 250–450)
UIBC: 278 ug/dL

## 2019-09-04 LAB — FERRITIN: Ferritin: 93 ng/mL (ref 11–307)

## 2019-09-04 NOTE — Assessment & Plan Note (Addendum)
#  Iron deficient anemia-unclear etiology.  Iron saturation is 10% ferritin is 44.  S/p Venofer-symptoms of fatigue improved; hemoglobin is 12.9.   #Hold Venofer today.  Continue p.o. iron.  # DISPOSITION:will call with irons resulst # HOLD Venfoer IV today  # follow up in 6 months- cbc/iron studies/ferritin; possible Venofer- Dr.B

## 2019-09-04 NOTE — Progress Notes (Signed)
[place] NOTE  Patient Care Team: Brita Romp Dionne Bucy, MD as PCP - General (Family Medicine) Watt Climes, PA as Physician Assistant (Physician Assistant) Marry Guan, Laurice Record, MD as Consulting Physician (Orthopedic Surgery) Pa, [place]anan as Consulting Physician (Optometry) Byrnett, Forest Gleason, MD (General Surgery) Dasher, Rayvon Char, MD (Dermatology) Grace Isaac, MD as Consulting Physician (Cardiothoracic Surgery) Cammie Sickle, MD as Consulting Physician (Internal Medicine)  CHIEF COMPLAINTS/PURPOSE OF CONSULTATION: Anemia  HEMATOLOGY HISTORY  # ANEMIA EGD/ colonoscopy- 2017 [Dr.Elliot]-August 2020-hemoglobin 10.4 MCV 78; iron saturation 10% ferritin 44; zinc copper/LDH heptoglobin-Normal. CT-C/A-P-NEG.   # Thoracic Aneurysm [4.4cm]- surveillaince  HISTORY OF PRESENTING ILLNESS:  Natasha Vasquez 79 y.o.  female with iron deficient anemia-unclear etiology is here for follow-up.  Patient denies any abdominal pain nausea vomiting.  No blood in stools no blood clots.  No constipation.  No fatigue.  She continues to be on oral iron.  Review of Systems  Constitutional: Negative for chills, diaphoresis, fever and weight loss.  HENT: Negative for nosebleeds and sore throat.   Eyes: Negative for double vision.  Respiratory: Negative for cough, hemoptysis, sputum production, shortness of breath and wheezing.   Cardiovascular: Negative for chest pain, palpitations, orthopnea and leg swelling.  Gastrointestinal: Positive for diarrhea. Negative for abdominal pain, blood in stool, constipation, heartburn, melena, nausea and vomiting.  Genitourinary: Negative for dysuria, frequency and urgency.  Musculoskeletal: Positive for back pain and joint pain.  Skin: Negative.  Negative for itching and rash.  Neurological: Negative for dizziness, tingling, focal weakness, weakness and headaches.  Endo/Heme/Allergies: Does not bruise/bleed easily.   Psychiatric/Behavioral: Negative for depression. The patient is not nervous/anxious and does not have insomnia.     MEDICAL HISTORY:  Past Medical History:  Diagnosis Date  . Anemia in pregnancy   . Arthritis    hands  . Barrett esophagus   . 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  ER/PR NEGATIVE  . Cataract   . Chronic cough   . Complication of anesthesia    20 years ago pts b/p dropped really low, "coded"  . Depression   . Depression   . Diabetes mellitus without complication (HCC)    diet controlled  . Family history of adverse reaction to anesthesia    son PONV  . GERD (gastroesophageal reflux disease)   . Hypercholesteremia   . Hyperlipidemia   . Hypertension   . Melanoma ([place])   . MVP (mitral valve prolapse)    followed by PCP  . Nephritis   . Nephritis   . S/P appendectomy   . Thoracic aortic aneurysm ([place])   . Torn meniscus    left    SURGICAL HISTORY: Past Surgical History:  Procedure Laterality Date  . ABDOMINAL HYSTERECTOMY    . APPENDECTOMY    . BREAST BIOPSY Right 04/25/2017   retroareolar 10:00   wing clip   path pending  . BREAST BIOPSY Right 04/25/2017   10:00  5CMFN  venus clip    path pending  . BREAST BIOPSY Right 05/03/2017   Affirm Bx- path pending  . BREAST CYST ASPIRATION Bilateral    NEG  . BREAST EXCISIONAL BIOPSY Left 20+ yrs ago   NEG  . BREAST RECONSTRUCTION WITH PLACEMENT OF TISSUE EXPANDER AND FLEX HD (ACELLULAR HYDRATED DERMIS) Right 06/03/2017   Procedure: BREAST RECONSTRUCTION WITH PLACEMENT OF TISSUE EXPANDER AND FLEX HD (ACELLULAR HYDRATED DERMIS);  Surgeon: Wallace Going, DO;  Location: [facility];  Service: Plastics;  Laterality: Right;  . BREAST REDUCTION SURGERY Left 11/06/2017   Procedure: BREAST REDUCTION;  Surgeon: Wallace Going, DO;  Location: [facility];  Service: Plastics;  Laterality: Left;  . BROW LIFT Bilateral 02/01/2015   Procedure: BLEPHAROPLASTY bilateral upper eyelids.;   Surgeon: Karle Starch, MD;  Location: [place];  Service: Ophthalmology;  Laterality: Bilateral;  DIABETIC - diet controlled  . CATARACT EXTRACTION W/PHACO Left 01/14/2019   Procedure: CATARACT EXTRACTION PHACO AND INTRAOCULAR LENS PLACEMENT (IOC) LEFT TECNIS TORIC ADD 5.62 00:46.5 30.0%;  Surgeon: Leandrew Koyanagi, MD;  Location: [place];  Service: Ophthalmology;  Laterality: Left;  diabetic - diet controlled  . CATARACT EXTRACTION W/PHACO Right 02/11/2019   Procedure: CATARACT EXTRACTION PHACO AND INTRAOCULAR LENS PLACEMENT (IOC) RIGHT DIABETIC 4.82 00:46.9 10.3%;  Surgeon: Leandrew Koyanagi, MD;  Location: [place];  Service: Ophthalmology;  Laterality: Right;  Diabetic - diet controlled  . CHOLECYSTECTOMY    . COLONOSCOPY    . COLONOSCOPY WITH PROPOFOL N/A 05/11/2015   Procedure: COLONOSCOPY WITH PROPOFOL;  Surgeon: Manya Silvas, MD;  Location[facility];  Service: Endoscopy;  Laterality: N/A;  . ESOPHAGOGASTRODUODENOSCOPY    . ESOPHAGOGASTRODUODENOSCOPY (EGD) WITH PROPOFOL  05/11/2015   Procedure: ESOPHAGOGASTRODUODENOSCOPY (EGD) WITH PROPOFOL;  Surgeon: Manya Silvas, MD;  Location: [facility];  Service: Endoscopy;;  . FRACTURE SURGERY Right    arm and shoulder  . JOINT REPLACEMENT    . KNEE ARTHROSCOPY Left 03/14/2016   Procedure: ARTHROSCOPY KNEE, PARTIAL MEDIAL MENISECTOMY, CHONDROPLASTY;  Surgeon: Dereck Leep, MD;  Location: [facility];  Service: Orthopedics;  Laterality: Left;  Marland Kitchen MASTECTOMY Right 2019  . MASTECTOMY W/ SENTINEL NODE BIOPSY Right 06/03/2017   Procedure: MASTECTOMY WITH SENTINEL LYMPH NODE BIOPSY;  Surgeon: Robert Bellow, MD;  Location: [facility];  Service: General;  Laterality: Right;  . PARTIAL HIP ARTHROPLASTY Right   . REDUCTION MAMMAPLASTY Left 2019  . REMOVAL OF BILATERAL TISSUE EXPANDERS WITH PLACEMENT OF BILATERAL BREAST IMPLANTS Right 11/06/2017   Procedure: REMOVAL OF TISSUE EXPANDER WITH PLACEMENT OF  BREAST IMPLANT;  Surgeon: Wallace Going, DO;  Location: [facility];  Service: Plastics;  Laterality: Right;  . TONSILLECTOMY      SOCIAL HISTORY: Social History   Socioeconomic History  . Marital status: Married    Spouse name: Sonia Side  . Number of children: 2  . Years of education: Not on file  . Highest education level: Master's degree (e.g., MA, MS, MEng, MEd, MSW, MBA)  Occupational History  . Occupation: retired  Tobacco Use  . Smoking status: Former Smoker    Types: Cigarettes    Quit date: 1965    Years since quitting: 18.6  . Smokeless tobacco: Never Used  . Tobacco comment: smoked in college  Vaping Use  . Vaping Use: Never used  Substance and Sexual Activity  . Alcohol use: Yes    Alcohol/week: 7.0 - 14.0 standard drinks    Types: 7 - 14 Glasses of wine per week    Comment: 1-2 glasses of wine per night  . Drug use: No  . Sexual activity: Never  Other Topics Concern  . 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    Social Determinants of Health   Financial Resource Strain: Low Risk   . Difficulty of Paying Living Expenses: Not hard at all  Food Insecurity: No  Food Insecurity  . Worried About Charity fundraiser in the Last Year: Never true  . Ran Out of Food in the Last Year: Never true  Transportation Needs: No Transportation Needs  . Lack of Transportation (Medical): No  . Lack of Transportation (Non-Medical): No  Physical Activity: Insufficiently Active  . Days of Exercise per Week: 3 days  . Minutes of Exercise per Session: 30 min  Stress: No Stress Concern Present  . Feeling of Stress : Not at all  Social Connections: Socially Integrated  . Frequency of Communication with Friends and Family: More than three times a week  . Frequency of Social Gatherings with Friends and Family: Three times a week  . Attends Religious Services: More than 4 times per year  .  Active Member of Clubs or Organizations: Yes  . Attends Archivist Meetings: Never  . Marital Status: Married  Human resources officer Violence: Not At Risk  . Fear of Current or Ex-Partner: No  . Emotionally Abused: No  . Physically Abused: No  . Sexually Abused: No    FAMILY HISTORY: Family History  Problem Relation Age of Onset  . Cancer Mother        colon/rectal  . Colon cancer Mother   . Heart disease Father   . Hypertension Father   . Atrial fibrillation Father   . Diabetes Father   . Cancer Brother        lung  . Healthy Son   . Healthy Son   . Breast cancer Neg Hx     ALLERGIES:  is allergic to codeine, lorazepam, oxycodone, paregoric, penicillins, quinolones, and aleve [naproxen sodium].  MEDICATIONS:  Current Outpatient Medications  Medication Sig Dispense Refill  . acetaminophen (TYLENOL) 500 MG tablet Take 500 mg by mouth every 6 (six) hours as needed.    Marland Kitchen albuterol (PROVENTIL HFA;VENTOLIN HFA) 108 (90 Base) MCG/ACT inhaler Inhale 2 puffs into the lungs every 6 (six) hours as needed for wheezing or shortness of breath. 1 Inhaler 2  . ARIPiprazole (ABILIFY) 2 MG tablet TAKE 1 TABLET (2 MG TOTAL) BY MOUTH EVERY EVENING. 90 tablet 1  . aspirin 81 MG tablet Take 81 mg by mouth daily.    Marland Kitchen atorvastatin (LIPITOR) 40 MG tablet TAKE 1 TABLET BY MOUTH EVERY DAY 90 tablet 2  . bismuth subsalicylate (PEPTO BISMOL) 262 MG chewable tablet Chew 262 mg by mouth as needed. **capsule**    . calcium carbonate (TUMS - DOSED IN MG ELEMENTAL CALCIUM) 500 MG chewable tablet Chew 1 tablet by mouth as needed for indigestion or heartburn.    . clobetasol ointment (TEMOVATE) 0.05 % APPLY TO AFFECTED AREAS AT NIGHT AS NEEDED    . ferrous sulfate 324 MG TBEC Take 324 mg by mouth daily with breakfast.    . fluocinonide (LIDEX) 0.05 % external solution APPLY TO AFFECTED AREAS OF SCALP DAILY UNTIL CLEAR, THEN FOR FLARES    . lisinopril (ZESTRIL) 5 MG tablet TAKE 1 TABLET BY MOUTH EVERY  DAY 90 tablet 1  . Multiple Vitamins-Minerals (CENTRUM SILVER PO) Take 1 tablet by mouth daily.    Marland Kitchen omeprazole (PRILOSEC) 40 MG capsule Take 40 mg by mouth 2 (two) times daily.     Marland Kitchen oxybutynin (DITROPAN-XL) 10 MG 24 hr tablet TAKE 1 TABLET BY MOUTH EVERYDAY AT BEDTIME 90 tablet 1  . tiotropium (SPIRIVA) 18 MCG inhalation capsule Place 1 capsule (18 mcg total) into inhaler and inhale daily. 90 capsule 3  . traMADol (ULTRAM)  50 MG tablet Take 1 tablet (50 mg total) by mouth every 6 (six) hours as needed. 8 tablet 0  . Venlafaxine HCl 225 MG TB24 TAKE 1 TABLET (225 MG TOTAL) BY MOUTH DAILY. 90 tablet 0   No current facility-administered medications for this visit.   PHYSICAL EXAMINATION:   Vitals:   09/04/19 1334  BP: 125/76  Pulse: 81  Resp: 16  Temp: (!) 96.1 F (35.6 C)  SpO2: 98%   Filed Weights   09/04/19 1334 09/04/19 1337  Weight: 185 lb (83.9 kg) 185 lb (83.9 kg)    Physical Exam HENT:     Head: Normocephalic and atraumatic.     Mouth/Throat:     Pharynx: No oropharyngeal exudate.  Eyes:     Pupils: Pupils are equal, round, and reactive to light.  Cardiovascular:     Rate and Rhythm: Normal rate and regular rhythm.  Pulmonary:     Effort: No respiratory distress.     Breath sounds: No wheezing.  Abdominal:     General: Bowel sounds are normal. There is no distension.     Palpations: Abdomen is soft. There is no mass.     Tenderness: There is no abdominal tenderness. There is no guarding or rebound.  Musculoskeletal:        General: No tenderness. Normal range of motion.     Cervical back: Normal range of motion and neck supple.  Skin:    General: Skin is warm.  Neurological:     Mental Status: She is alert and oriented to person, place, and time.  Psychiatric:        Mood and Affect: Affect normal.     LABORATORY DATA:  I have reviewed the data as listed Lab Results  Component Value Date   WBC 9.1 09/04/2019   HGB 12.9 09/04/2019   HCT 38.7  09/04/2019   MCV 85.6 09/04/2019   PLT 331 09/04/2019   Recent Labs    09/17/18 1523 08/05/19 0843  NA 140 144  K 4.3 4.7  CL 103 104  CO2 23 23  GLUCOSE 76 135*  BUN 13 17  CREATININE 0.91 1.11*  CALCIUM 9.3 9.8  GFRNONAA 61 47*  GFRAA 70 55*  PROT 6.2 6.7  ALBUMIN 4.5 4.5  AST 17 17  ALT 14 21  ALKPHOS 87 105  BILITOT 0.2 0.2     CT ANGIO CHEST AORTA W/CM & OR WO/CM  Result Date: 08/13/2019 CLINICAL DATA:  79 year old female with follow-up thoracic aortic aneurysm. EXAM: CT ANGIOGRAPHY CHEST WITH CONTRAST TECHNIQUE: Multidetector CT imaging of the chest was performed using the standard protocol during bolus administration of intravenous contrast. Multiplanar CT image reconstructions and MIPs were obtained to evaluate the vascular anatomy. CONTRAST:  4mL ISOVUE-370 IOPAMIDOL (ISOVUE-370) INJECTION 76% COMPARISON:  Chest CT dated 10/17/2018. FINDINGS: Cardiovascular: There is no cardiomegaly or pericardial effusion. Mildly dilated ascending thoracic aorta measuring up to 4.2 cm in diameter. No interval change. No dissection. There is mild atherosclerotic calcification of the thoracic aorta. The origins of the great vessels of the aortic arch appear patent. The central pulmonary arteries are patent. Mediastinum/Nodes: There is no hilar or mediastinal adenopathy. The esophagus is grossly unremarkable. No mediastinal fluid collection. Lungs/Pleura: The lungs are clear. There is no pleural effusion pneumothorax. The central airways are patent. Upper Abdomen: No acute abnormality. Musculoskeletal: Right mastectomy and implant. No acute osseous pathology. Review of the MIP images confirms the above findings. IMPRESSION: 1. No acute intrathoracic pathology. 2.  Stable mild dilatation of the ascending thoracic aorta measuring up to 4.2 cm in diameter. Recommend annual imaging followup by CTA or MRA. This recommendation follows 2010 ACCF/AHA/AATS/ACR/ASA/SCA/SCAI/SIR/STS/SVM Guidelines for the  Diagnosis and Management of Patients with Thoracic Aortic Disease. Circulation. 2010; 121: C588-F027. Aortic aneurysm NOS (ICD10-I71.9) 3. Aortic Atherosclerosis (ICD10-I70.0). Electronically Signed   By: Anner Crete M.D.   On: 08/13/2019 16:00    Iron deficiency #Iron deficient anemia-unclear etiology.  Iron saturation is 10% ferritin is 44.  S/p Venofer-symptoms of fatigue improved; hemoglobin is 12.9.   #Hold Venofer today.  Continue p.o. iron.  # DISPOSITION:will call with irons resulst # HOLD Venfoer IV today  # follow up in 6 months- cbc/iron studies/ferritin; possible Venofer- Dr.B   All questions were answered. The patient knows to call the clinic with any problems, questions or concerns.    Cammie Sickle, MD 09/04/2019 2:29 PM

## 2019-09-15 ENCOUNTER — Other Ambulatory Visit: Payer: Self-pay | Admitting: Family Medicine

## 2019-09-15 NOTE — Telephone Encounter (Signed)
Requested medications are due for refill today?    Yes   Requested medications are on active medication list?  Yes   Last Refill:  06/20/2019  # 90 with no refills  Future visit scheduled?  Yes - Tomorrow  Notes to Clinic:  Medication failed RX refill protocol due to elevated triglycerides.  Please review.

## 2019-09-16 ENCOUNTER — Ambulatory Visit: Payer: Medicare PPO | Admitting: Family Medicine

## 2019-09-16 ENCOUNTER — Encounter: Payer: Self-pay | Admitting: Family Medicine

## 2019-09-16 ENCOUNTER — Ambulatory Visit: Payer: Self-pay | Admitting: Family Medicine

## 2019-09-16 ENCOUNTER — Other Ambulatory Visit: Payer: Self-pay

## 2019-09-16 VITALS — BP 120/82 | HR 77 | Temp 98.4°F | Wt 183.0 lb

## 2019-09-16 DIAGNOSIS — E1142 Type 2 diabetes mellitus with diabetic polyneuropathy: Secondary | ICD-10-CM

## 2019-09-16 DIAGNOSIS — N3281 Overactive bladder: Secondary | ICD-10-CM

## 2019-09-16 DIAGNOSIS — E785 Hyperlipidemia, unspecified: Secondary | ICD-10-CM

## 2019-09-16 DIAGNOSIS — E038 Other specified hypothyroidism: Secondary | ICD-10-CM

## 2019-09-16 DIAGNOSIS — R350 Frequency of micturition: Secondary | ICD-10-CM

## 2019-09-16 DIAGNOSIS — I10 Essential (primary) hypertension: Secondary | ICD-10-CM | POA: Diagnosis not present

## 2019-09-16 DIAGNOSIS — E039 Hypothyroidism, unspecified: Secondary | ICD-10-CM

## 2019-09-16 DIAGNOSIS — E1169 Type 2 diabetes mellitus with other specified complication: Secondary | ICD-10-CM | POA: Diagnosis not present

## 2019-09-16 MED ORDER — CLOTRIMAZOLE 1 % EX CREA: 1.0000 "application " | TOPICAL_CREAM | Freq: Two times a day (BID) | CUTANEOUS | 1 refills | Status: DC

## 2019-09-16 MED ORDER — ROSUVASTATIN CALCIUM 10 MG PO TABS: 10.0000 mg | ORAL_TABLET | Freq: Every day | ORAL | 1 refills | Status: DC

## 2019-09-16 MED ORDER — MIRABEGRON ER 25 MG PO TB24: 25.0000 mg | ORAL_TABLET | Freq: Every day | ORAL | 1 refills | Status: DC

## 2019-09-16 NOTE — Assessment & Plan Note (Signed)
Asymptomatic, no indication for medication Reviewed last TSH

## 2019-09-16 NOTE — Assessment & Plan Note (Addendum)
Well controlled with last A1c 6.3 Diet controlled - no meds UTD on vaccines, eye exam, foot exam On ACEi On Statin Discussed diet and exercise F/u in 6 months

## 2019-09-16 NOTE — Progress Notes (Signed)
I,Laura E Walsh,acting as a scribe for Lavon Paganini, MD.,have documented all relevant documentation on the behalf of Lavon Paganini, MD,as directed by  Lavon Paganini, MD while in the presence of Lavon Paganini, MD.  Established patient visit   Patient: Natasha Vasquez   DOB: 11/22/1940   79 y.o. Female  MRN: [id number] Visit Date: 09/16/2019  Today's healthcare provider: Lavon Paganini, MD   Chief Complaint  Patient presents with   Diabetes   Hyperlipidemia   Hypertension   Rash   Subjective    Rash This is a recurrent problem. The problem has been waxing and waning since onset. Location: Under left breast. The rash is characterized by pain, redness and itchiness. She was exposed to nothing. Associated symptoms include coughing (Chronic issue since 12/2017 ). Pertinent negatives include no fever or shortness of breath.     Diabetes Mellitus Type II, follow-up  Lab Results  Component Value Date   HGBA1C 6.3 (H) 08/05/2019   HGBA1C 6.1 (H) 09/17/2018   HGBA1C 5.9 (H) 03/14/2018   Last seen for diabetes 6 months ago.  Management since then includes continuing the same treatment. She reports excellent compliance with treatment. She is not having side effects.   Home blood sugar records: are not being check  Episodes of hypoglycemia? No    Current insulin regiment: None Most Recent Eye Exam: 11/25/2018  --------------------------------------------------------------------------------------------------- Hypertension, follow-up  BP Readings from Last 3 Encounters:  09/16/19 120/82  09/04/19 125/76  08/13/19 (!) 145/87   Wt Readings from Last 3 Encounters:  09/16/19 183 lb (83 kg)  09/04/19 185 lb (83.9 kg)  08/13/19 183 lb (83 kg)     She was last seen for hypertension 6 months ago.  Management since that visit includes no changes. She reports excellent compliance with treatment. She is not having side effects.  She is exercising. She  is adherent to low salt diet.   Outside blood pressures are not being checked.  She does not smoke.  Use of agents associated with hypertension: none.   --------------------------------------------------------------------------------------------------- Lipid/Cholesterol, follow-up  Last Lipid Panel: Lab Results  Component Value Date   CHOL 174 08/05/2019   LDLCALC 96 08/05/2019   HDL 40 08/05/2019   TRIG 223 (H) 08/05/2019    She was last seen for this 6 months ago.  Management since that visit includes: Cholesterol not to goal at last check. Will discuss treatment options at this visit.  She reports excellent compliance with treatment. She is not having side effects.   Symptoms: No appetite changes No foot ulcerations  No chest pain No chest pressure/discomfort  No dyspnea No orthopnea  No fatigue No lower extremity edema  No palpitations No paroxysmal nocturnal dyspnea  No nausea No numbness or tingling of extremity  No polydipsia No polyuria  No speech difficulty No syncope   She is following a Regular diet. Current exercise: walking  Last metabolic panel Lab Results  Component Value Date   GLUCOSE 135 (H) 08/05/2019   NA 144 08/05/2019   K 4.7 08/05/2019   BUN 17 08/05/2019   CREATININE 1.11 (H) 08/05/2019   GFRNONAA 47 (L) 08/05/2019   GFRAA 55 (L) 08/05/2019   CALCIUM 9.8 08/05/2019   AST 17 08/05/2019   ALT 21 08/05/2019   The 10-year ASCVD risk score Mikey Bussing DC Jr., et al., 2013) is: 45.3%  ---------------------------------------------------------------------------------------------------   Patient Active Problem List   Diagnosis Date Noted   Iron deficiency 10/07/2018   Primary osteoarthritis  of left knee 09/17/2018   Emphysema lung ([place]) 03/14/2018   Diabetic peripheral neuropathy associated with type 2 diabetes mellitus ([place]) 03/14/2018   Metatarsalgia of both feet 03/14/2018   Swelling of first metatarsophalangeal (MTP) joint 03/14/2018     Abnormality of lung 03/14/2018   Acquired absence of right breast 06/11/2017   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[place]) 05/09/2017   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   Dysphagia 03/11/2017   Class 1 obesity with serious comorbidity and body mass index (BMI) of 31.0 to 31.9 in adult 03/11/2017   Subclinical hypothyroidism 06/08/2015   Osteopenia 06/08/2015   Billowing mitral valve 06/08/2015   Melanoma in situ ([place]) 06/08/2015   Cramps of lower extremity 06/08/2015   Arthritis 06/08/2015   Microcytic anemia 06/08/2015   Type 2 diabetes mellitus ([place]) 05/18/2015   Colitis, enteritis, and gastroenteritis of presumed infectious origin 05/18/2015   Closed fracture of neck of right femur ([place]) 03/25/2015   Clinical depression 01/26/2015   Hyperlipidemia associated with type 2 diabetes mellitus ([place]) 10/28/2014   Essential hypertension 09/27/2014   GERD (gastroesophageal reflux disease) 08/06/2014   Overactive bladder 07/16/2014   Past Medical History:  Diagnosis Date   Anemia in pregnancy    Arthritis    hands   Barrett esophagus    Bell palsy 04/24/2004   Breast neoplasm, Tis (DCIS), right 06/03/2017   36 mm area DCIS.  ER/PR NEGATIVE   Cataract    Chronic cough    Complication of anesthesia    20 years ago pts b/p dropped really low, "coded"   Depression    Depression    Diabetes mellitus without complication (HCC)    diet controlled   Family history of adverse reaction to anesthesia    son PONV   GERD (gastroesophageal reflux disease)    Hypercholesteremia    Hyperlipidemia    Hypertension    Melanoma ([place])    MVP (mitral valve prolapse)    followed by PCP   Nephritis    Nephritis    S/P appendectomy    Thoracic aortic aneurysm (HCC)    Torn meniscus    left   Social History   Tobacco Use   Smoking status: Former Smoker    Types: Cigarettes    Quit date: 1965     Years since quitting: 56.7   Smokeless tobacco: Never Used   Tobacco comment: smoked in [employer] Use: Never used  Substance Use Topics   Alcohol use: Yes    Alcohol/week: 7.0 - 14.0 standard drinks    Types: 7 - 14 Glasses of wine per week    Comment: 1-2 glasses of wine per night   Drug use: No   Allergies  Allergen Reactions   Codeine Other (See Comments)    Chest pain   Lorazepam Other (See Comments)    "Feels loopy"   Oxycodone Other (See Comments)    MENTAL CHANGES   Paregoric     Chest pain---tolerated MORPHINE   Penicillins Hives    Has patient had a PCN reaction causing immediate rash, facial/tongue/throat swelling, SOB or lightheadedness with hypotension: Yes Has patient had a PCN reaction causing severe rash involving mucus membranes or skin necrosis: No Has patient had a PCN reaction that required hospitalization: NO Has patient had a PCN reaction occurring within the last 10 years: No If all of the above answers are "NO", then may proceed with Cephalosporin  use.  Can take Augmentin   Quinolones     PATIENT UNAWARE OF allergy to this class of antibiotics, only aware of allergy to [facility] Patient was warned about not using Cipro and similar antibiotics. Recent studies have raised concern that fluoroquinolone antibiotics could be associated with an increased risk of aortic aneurysm Fluoroquinolones have non-antimicrobial properties that might jeopardise the integrity of the extracellular matrix of the vascular wall In a  propensity score matched cohort study in [place], there was a 66% increased rate   Aleve [Naproxen Sodium] Hives, Rash and Other (See Comments)    headaches     Medications: Outpatient Medications Prior to Visit  Medication Sig   acetaminophen (TYLENOL) 500 MG tablet Take 500 mg by mouth every 6 (six) hours as needed.   albuterol (PROVENTIL HFA;VENTOLIN HFA) 108 (90 Base) MCG/ACT inhaler Inhale 2 puffs into the lungs  every 6 (six) hours as needed for wheezing or shortness of breath.   ARIPiprazole (ABILIFY) 2 MG tablet TAKE 1 TABLET (2 MG TOTAL) BY MOUTH EVERY EVENING.   aspirin 81 MG tablet Take 81 mg by mouth daily.   atorvastatin (LIPITOR) 40 MG tablet TAKE 1 TABLET BY MOUTH EVERY DAY   bismuth subsalicylate (PEPTO BISMOL) 262 MG chewable tablet Chew 262 mg by mouth as needed. **capsule**   calcium carbonate (TUMS - DOSED IN MG ELEMENTAL CALCIUM) 500 MG chewable tablet Chew 1 tablet by mouth as needed for indigestion or heartburn.   clobetasol ointment (TEMOVATE) 0.05 % APPLY TO AFFECTED AREAS AT NIGHT AS NEEDED   ferrous sulfate 324 MG TBEC Take 324 mg by mouth daily with breakfast.   fluocinonide (LIDEX) 0.05 % external solution APPLY TO AFFECTED AREAS OF SCALP DAILY UNTIL CLEAR, THEN FOR FLARES   lisinopril (ZESTRIL) 5 MG tablet TAKE 1 TABLET BY MOUTH EVERY DAY   Multiple Vitamins-Minerals (CENTRUM SILVER PO) Take 1 tablet by mouth daily.   omeprazole (PRILOSEC) 40 MG capsule Take 40 mg by mouth 2 (two) times daily.    oxybutynin (DITROPAN-XL) 10 MG 24 hr tablet TAKE 1 TABLET BY MOUTH EVERYDAY AT BEDTIME   tiotropium (SPIRIVA) 18 MCG inhalation capsule Place 1 capsule (18 mcg total) into inhaler and inhale daily.   Venlafaxine HCl 225 MG TB24 TAKE 1 TABLET BY MOUTH DAILY.   [DISCONTINUED] traMADol (ULTRAM) 50 MG tablet Take 1 tablet (50 mg total) by mouth every 6 (six) hours as needed.   No facility-administered medications prior to visit.    Review of Systems  Constitutional: Negative.  Negative for fever.  Respiratory: Positive for cough (Chronic issue since 12/2017 ). Negative for apnea, choking, chest tightness, shortness of breath, wheezing and stridor.   Cardiovascular: Negative.  Negative for palpitations and leg swelling.  Gastrointestinal: Negative.   Skin: Positive for rash. Negative for color change, pallor and wound.  Neurological: Negative for dizziness,  light-headedness and headaches.    Last CBC Lab Results  Component Value Date   WBC 9.1 09/04/2019   HGB 12.9 09/04/2019   HCT 38.7 09/04/2019   MCV 85.6 09/04/2019   MCH 28.5 09/04/2019   RDW 14.9 09/04/2019   PLT 331 16/10/9602   Last metabolic panel Lab Results  Component Value Date   GLUCOSE 135 (H) 08/05/2019   NA 144 08/05/2019   K 4.7 08/05/2019   CL 104 08/05/2019   CO2 23 08/05/2019   BUN 17 08/05/2019   CREATININE 1.11 (H) 08/05/2019   GFRNONAA 47 (L) 08/05/2019   GFRAA 55 (L)  08/05/2019   CALCIUM 9.8 08/05/2019   PROT 6.7 08/05/2019   ALBUMIN 4.5 08/05/2019   LABGLOB 2.2 08/05/2019   AGRATIO 2.0 08/05/2019   BILITOT 0.2 08/05/2019   ALKPHOS 105 08/05/2019   AST 17 08/05/2019   ALT 21 08/05/2019   Last lipids Lab Results  Component Value Date   CHOL 174 08/05/2019   HDL 40 08/05/2019   LDLCALC 96 08/05/2019   TRIG 223 (H) 08/05/2019   CHOLHDL 4.4 08/05/2019    Objective    BP 120/82 (BP Location: Left Arm, Patient Position: Sitting, Cuff Size: Large)    Pulse 77    Temp 98.4 F (36.9 C) (Oral)    Wt 183 lb (83 kg)    BMI 30.45 kg/m  BP Readings from Last 3 Encounters:  09/16/19 120/82  09/04/19 125/76  08/13/19 (!) 145/87    Physical Exam Vitals and nursing note reviewed.  Constitutional:      General: She is not in acute distress.    Appearance: Normal appearance. She is not diaphoretic.  HENT:     Head: Normocephalic and atraumatic.  Cardiovascular:     Rate and Rhythm: Normal rate and regular rhythm.     Pulses: Normal pulses.     Heart sounds: Normal heart sounds. No murmur heard.   Pulmonary:     Effort: Pulmonary effort is normal. No respiratory distress.     Breath sounds: Normal breath sounds. No wheezing.  Abdominal:     General: There is no distension.     Palpations: Abdomen is soft.     Tenderness: There is no abdominal tenderness.  Musculoskeletal:     Cervical back: No tenderness.     Right lower leg: No edema.      Left lower leg: No edema.  Lymphadenopathy:     Cervical: No cervical adenopathy.  Skin:    Comments: Erythematous rash under L breast with satellite lesions  Neurological:     Mental Status: She is alert and oriented to person, place, and time. Mental status is at baseline.  Psychiatric:        Mood and Affect: Mood normal.        Behavior: Behavior normal.       No results found for any visits on 09/16/19.  Assessment & Plan     Problem List Items Addressed This Visit      Cardiovascular and Mediastinum   Essential hypertension - Primary    Well controlled Continue current medications Reviewed metabolic panel F/u in 6 months       Relevant Medications   rosuvastatin (CRESTOR) 10 MG tablet     Endocrine   Hyperlipidemia associated with type 2 diabetes mellitus ([place])    LDL elevated at last check.  Will change from atorvastatin to Crestor 10mg  daily. Discuss the use of CoQ-10 to help with muscle pain/cramps. Repeat FLP and CMP in 3 months  Goal LDL < 70       Relevant Medications   rosuvastatin (CRESTOR) 10 MG tablet   Subclinical hypothyroidism    Asymptomatic, no indication for medication Reviewed last TSH      Type 2 diabetes mellitus ([place])    Well controlled with last A1c 6.3 Diet controlled - no meds UTD on vaccines, eye exam, foot exam On ACEi On Statin Discussed diet and exercise F/u in 6 months       Relevant Medications   rosuvastatin (CRESTOR) 10 MG tablet     Genitourinary   Overactive  bladder    Pt has been on oxybutynin for several years. Pt states it has lost it effectiveness.  She is up several times at night to use the restroom. Trial of Myrbetriq 25mg  daily in place of Oxybutinin      Relevant Medications   mirabegron ER (MYRBETRIQ) 25 MG TB24 tablet      Return in about 6 months (around 03/15/2020) for AWV, CPE.       I, Lavon Paganini, MD, have reviewed all documentation for this visit. The documentation on 09/16/19  for the exam, diagnosis, procedures, and orders are all accurate and complete.   Virdie Penning, Dionne Bucy, MD, MPH [facility]

## 2019-09-16 NOTE — Assessment & Plan Note (Signed)
Well controlled Continue current medications Reviewed metabolic panel F/u in 6 months  

## 2019-09-16 NOTE — Assessment & Plan Note (Addendum)
Pt has been on oxybutynin for several years. Pt states it has lost it effectiveness.  She is up several times at night to use the restroom. Trial of Myrbetriq 25mg  daily in place of Oxybutinin

## 2019-09-16 NOTE — Assessment & Plan Note (Addendum)
LDL elevated at last check.  Will change from atorvastatin to Crestor 10mg  daily. Discuss the use of CoQ-10 to help with muscle pain/cramps. Repeat FLP and CMP in 3 months  Goal LDL < 70

## 2019-09-28 DIAGNOSIS — H35372 Puckering of macula, left eye: Secondary | ICD-10-CM | POA: Diagnosis not present

## 2019-09-28 DIAGNOSIS — E119 Type 2 diabetes mellitus without complications: Secondary | ICD-10-CM | POA: Diagnosis not present

## 2019-09-28 LAB — HM DIABETES EYE EXAM

## 2019-09-30 ENCOUNTER — Encounter: Payer: Self-pay | Admitting: Family Medicine

## 2019-10-01 ENCOUNTER — Telehealth: Payer: Self-pay | Admitting: Family Medicine

## 2019-10-01 DIAGNOSIS — N3281 Overactive bladder: Secondary | ICD-10-CM

## 2019-10-01 NOTE — Telephone Encounter (Signed)
That's interesting.  I've never heard of joint pain with Myrbetriq, but we can switch back to previous dose of Oxybutinin.  Ok to [employer]

## 2019-10-01 NOTE — Telephone Encounter (Signed)
Copied from [place] [id number]. Topic: General - Other >> Oct 01, 2019 10:16 AM Celene Kras wrote: Reason for CRM: Pt called and is requesting to speak with nurse regarding her medication myrbetriq. She states that the medication is causing her knee pain. She states that she sees no other benefits from the medication. She is requesting to be put back on oxybutynin instead. Please advise.     [id number] [place] [address] [place] [address] Phone: [phone] Fax: [phone] Hours: Not open 24 hours

## 2019-10-02 MED ORDER — OXYBUTYNIN CHLORIDE ER 10 MG PO TB24: 10.0000 mg | ORAL_TABLET | Freq: Every day | ORAL | 1 refills | Status: DC

## 2019-10-02 NOTE — Telephone Encounter (Signed)
Pt advised.  [facility] to [facility].   Thanks,   -Shantrice Rodenberg  

## 2019-10-24 ENCOUNTER — Other Ambulatory Visit: Payer: Self-pay | Admitting: Family Medicine

## 2019-10-24 DIAGNOSIS — I10 Essential (primary) hypertension: Secondary | ICD-10-CM

## 2019-11-10 DIAGNOSIS — L298 Other pruritus: Secondary | ICD-10-CM | POA: Diagnosis not present

## 2019-11-10 DIAGNOSIS — D2262 Melanocytic nevi of left upper limb, including shoulder: Secondary | ICD-10-CM | POA: Diagnosis not present

## 2019-11-10 DIAGNOSIS — L538 Other specified erythematous conditions: Secondary | ICD-10-CM | POA: Diagnosis not present

## 2019-11-10 DIAGNOSIS — Z85820 Personal history of malignant melanoma of skin: Secondary | ICD-10-CM | POA: Diagnosis not present

## 2019-11-10 DIAGNOSIS — Z85828 Personal history of other malignant neoplasm of skin: Secondary | ICD-10-CM | POA: Diagnosis not present

## 2019-11-10 DIAGNOSIS — D2272 Melanocytic nevi of left lower limb, including hip: Secondary | ICD-10-CM | POA: Diagnosis not present

## 2019-11-10 DIAGNOSIS — L820 Inflamed seborrheic keratosis: Secondary | ICD-10-CM | POA: Diagnosis not present

## 2019-11-10 DIAGNOSIS — L570 Actinic keratosis: Secondary | ICD-10-CM | POA: Diagnosis not present

## 2019-11-10 DIAGNOSIS — D2261 Melanocytic nevi of right upper limb, including shoulder: Secondary | ICD-10-CM | POA: Diagnosis not present

## 2019-12-08 ENCOUNTER — Other Ambulatory Visit: Payer: Self-pay | Admitting: Family Medicine

## 2020-02-29 ENCOUNTER — Other Ambulatory Visit: Payer: Self-pay | Admitting: Family Medicine

## 2020-03-04 ENCOUNTER — Encounter: Payer: Self-pay | Admitting: Internal Medicine

## 2020-03-04 ENCOUNTER — Inpatient Hospital Stay: Payer: Medicare PPO

## 2020-03-04 ENCOUNTER — Inpatient Hospital Stay (HOSPITAL_BASED_OUTPATIENT_CLINIC_OR_DEPARTMENT_OTHER): Payer: Medicare PPO | Admitting: Internal Medicine

## 2020-03-04 ENCOUNTER — Inpatient Hospital Stay: Payer: Medicare PPO | Attending: Internal Medicine

## 2020-03-04 DIAGNOSIS — E611 Iron deficiency: Secondary | ICD-10-CM | POA: Diagnosis not present

## 2020-03-04 DIAGNOSIS — D509 Iron deficiency anemia, unspecified: Secondary | ICD-10-CM | POA: Insufficient documentation

## 2020-03-04 DIAGNOSIS — Z87891 Personal history of nicotine dependence: Secondary | ICD-10-CM | POA: Insufficient documentation

## 2020-03-04 DIAGNOSIS — Z833 Family history of diabetes mellitus: Secondary | ICD-10-CM | POA: Diagnosis not present

## 2020-03-04 DIAGNOSIS — K529 Noninfective gastroenteritis and colitis, unspecified: Secondary | ICD-10-CM | POA: Insufficient documentation

## 2020-03-04 DIAGNOSIS — Z9049 Acquired absence of other specified parts of digestive tract: Secondary | ICD-10-CM | POA: Diagnosis not present

## 2020-03-04 DIAGNOSIS — Z8249 Family history of ischemic heart disease and other diseases of the circulatory system: Secondary | ICD-10-CM | POA: Insufficient documentation

## 2020-03-04 DIAGNOSIS — K219 Gastro-esophageal reflux disease without esophagitis: Secondary | ICD-10-CM | POA: Diagnosis not present

## 2020-03-04 DIAGNOSIS — I10 Essential (primary) hypertension: Secondary | ICD-10-CM | POA: Diagnosis not present

## 2020-03-04 DIAGNOSIS — Z85820 Personal history of malignant melanoma of skin: Secondary | ICD-10-CM | POA: Insufficient documentation

## 2020-03-04 DIAGNOSIS — E119 Type 2 diabetes mellitus without complications: Secondary | ICD-10-CM | POA: Insufficient documentation

## 2020-03-04 DIAGNOSIS — F32A Depression, unspecified: Secondary | ICD-10-CM | POA: Diagnosis not present

## 2020-03-04 DIAGNOSIS — Z79899 Other long term (current) drug therapy: Secondary | ICD-10-CM | POA: Insufficient documentation

## 2020-03-04 DIAGNOSIS — Z800 Family history of malignant neoplasm of digestive organs: Secondary | ICD-10-CM | POA: Diagnosis not present

## 2020-03-04 DIAGNOSIS — Z801 Family history of malignant neoplasm of trachea, bronchus and lung: Secondary | ICD-10-CM | POA: Insufficient documentation

## 2020-03-04 DIAGNOSIS — Z7982 Long term (current) use of aspirin: Secondary | ICD-10-CM | POA: Insufficient documentation

## 2020-03-04 DIAGNOSIS — E7800 Pure hypercholesterolemia, unspecified: Secondary | ICD-10-CM | POA: Diagnosis not present

## 2020-03-04 DIAGNOSIS — E785 Hyperlipidemia, unspecified: Secondary | ICD-10-CM | POA: Insufficient documentation

## 2020-03-04 LAB — CBC WITH DIFFERENTIAL/PLATELET
Abs Immature Granulocytes: 0.05 10*3/uL (ref 0.00–0.07)
Basophils Absolute: 0.1 10*3/uL (ref 0.0–0.1)
Basophils Relative: 1 %
Eosinophils Absolute: 0.2 10*3/uL (ref 0.0–0.5)
Eosinophils Relative: 3 %
HCT: 38.4 % (ref 36.0–46.0)
Hemoglobin: 12.2 g/dL (ref 12.0–15.0)
Immature Granulocytes: 1 %
Lymphocytes Relative: 26 %
Lymphs Abs: 1.7 10*3/uL (ref 0.7–4.0)
MCH: 27.7 pg (ref 26.0–34.0)
MCHC: 31.8 g/dL (ref 30.0–36.0)
MCV: 87.3 fL (ref 80.0–100.0)
Monocytes Absolute: 0.6 10*3/uL (ref 0.1–1.0)
Monocytes Relative: 9 %
Neutro Abs: 4 10*3/uL (ref 1.7–7.7)
Neutrophils Relative %: 60 %
Platelets: 343 10*3/uL (ref 150–400)
RBC: 4.4 MIL/uL (ref 3.87–5.11)
RDW: 14.4 % (ref 11.5–15.5)
WBC: 6.7 10*3/uL (ref 4.0–10.5)
nRBC: 0 % (ref 0.0–0.2)

## 2020-03-04 LAB — IRON AND TIBC
Iron: 60 ug/dL (ref 28–170)
Saturation Ratios: 17 % (ref 10.4–31.8)
TIBC: 358 ug/dL (ref 250–450)
UIBC: 298 ug/dL

## 2020-03-04 LAB — FERRITIN: Ferritin: 79 ng/mL (ref 11–307)

## 2020-03-04 NOTE — Assessment & Plan Note (Addendum)
#  Iron deficient anemia-unclear etiology.  Iron saturation is 10% ferritin is 44.  S/p Venofer-symptoms of fatigue improved; hemoglobin is 12.2.   #Hold Venofer today.  Continue p.o. iron.  # Chronic diarrhea- [1-4/day]; recommend eval with GI [[email or web address]]. Recommend re-evaluation with GI. Will refer.   # DISPOSITION: # refer to Dr.T[place]edo re: chronic diarrhea.  # No venofer  # follow up in 6 months- cbc/iron studies/ferritin; possible Venofer- Dr.B

## 2020-03-04 NOTE — Progress Notes (Signed)
[facility] NOTE  Patient Care Team: Brita Romp Dionne Bucy, MD as PCP - General (Family Medicine) Watt Climes, PA as Physician Assistant (Physician Assistant) Marry Guan, Laurice Record, MD as Consulting Physician (Orthopedic Surgery) Pa, Callery as Consulting Physician (Optometry) Byrnett, Forest Gleason, MD (General Surgery) Dasher, Rayvon Char, MD (Dermatology) Grace Isaac, MD as Consulting Physician (Cardiothoracic Surgery) Cammie Sickle, MD as Consulting Physician (Internal Medicine)  CHIEF COMPLAINTS/PURPOSE OF CONSULTATION: Anemia  HEMATOLOGY HISTORY  # ANEMIA EGD/ colonoscopy- 2017 [Dr.Elliot]-August 2020-hemoglobin 10.4 MCV 78; iron saturation 10% ferritin 44; zinc copper/LDH heptoglobin-Normal. CT-C/A-P-NEG.   # Thoracic Aneurysm [4.4cm]- surveillaince  HISTORY OF PRESENTING ILLNESS:  Natasha Vasquez 80 y.o.  female with iron deficient anemia-unclear etiology is here for follow-up.  Patient complains of chronic diarrhea.  Stools 1 to 4-day at least 3-4 times a week.  Denies any dietary pattern.  Last colonoscopy in 2017.  Interested in a GI evaluation.  Continues to be fatigued.  Review of Systems  Constitutional: Positive for malaise/fatigue. Negative for chills, diaphoresis, fever and weight loss.  HENT: Negative for nosebleeds and sore throat.   Eyes: Negative for double vision.  Respiratory: Negative for cough, hemoptysis, sputum production, shortness of breath and wheezing.   Cardiovascular: Negative for chest pain, palpitations, orthopnea and leg swelling.  Gastrointestinal: Positive for diarrhea. Negative for abdominal pain, blood in stool, constipation, heartburn, melena, nausea and vomiting.  Genitourinary: Negative for dysuria, frequency and urgency.  Musculoskeletal: Positive for back pain and joint pain.  Skin: Negative.  Negative for itching and rash.  Neurological: Negative for dizziness, tingling, focal weakness, weakness and  headaches.  Endo/Heme/Allergies: Does not bruise/bleed easily.  Psychiatric/Behavioral: Negative for depression. The patient is not nervous/anxious and does not have insomnia.     MEDICAL HISTORY:  Past Medical History:  Diagnosis Date  . Anemia in pregnancy   . Arthritis    hands  . Barrett esophagus   . Bell palsy 04/24/2004  . 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  ER/PR NEGATIVE  . Cataract   . Chronic cough   . Complication of anesthesia    20 years ago pts b/p dropped really low, "coded"  . Depression   . Depression   . Diabetes mellitus without complication (HCC)    diet controlled  . Family history of adverse reaction to anesthesia    son PONV  . GERD (gastroesophageal reflux disease)   . Hypercholesteremia   . Hyperlipidemia   . Hypertension   . Melanoma ([place])   . MVP (mitral valve prolapse)    followed by PCP  . Nephritis   . Nephritis   . S/P appendectomy   . Thoracic aortic aneurysm ([place])   . Torn meniscus    left    SURGICAL HISTORY: Past Surgical History:  Procedure Laterality Date  . ABDOMINAL HYSTERECTOMY    . APPENDECTOMY    . BREAST BIOPSY Right 04/25/2017   retroareolar 10:00   wing clip   path pending  . BREAST BIOPSY Right 04/25/2017   10:00  5CMFN  venus clip    path pending  . BREAST BIOPSY Right 05/03/2017   Affirm Bx- path pending  . BREAST CYST ASPIRATION Bilateral    NEG  . BREAST EXCISIONAL BIOPSY Left 20+ yrs ago   NEG  . BREAST RECONSTRUCTION WITH PLACEMENT OF TISSUE EXPANDER AND FLEX HD (ACELLULAR HYDRATED DERMIS) Right 06/03/2017   Procedure: BREAST RECONSTRUCTION WITH PLACEMENT  OF TISSUE EXPANDER AND FLEX HD (ACELLULAR HYDRATED DERMIS);  Surgeon: Wallace Going, DO;  Location: [facility];  Service: Plastics;  Laterality: Right;  . BREAST REDUCTION SURGERY Left 11/06/2017   Procedure: BREAST REDUCTION;  Surgeon: Wallace Going, DO;  Location: [facility];  Service: Plastics;  Laterality: Left;   . BROW LIFT Bilateral 02/01/2015   Procedure: BLEPHAROPLASTY bilateral upper eyelids.;  Surgeon: Karle Starch, MD;  Location: [facility];  Service: Ophthalmology;  Laterality: Bilateral;  DIABETIC - diet controlled  . CATARACT EXTRACTION W/PHACO Left 01/14/2019   Procedure: CATARACT EXTRACTION PHACO AND INTRAOCULAR LENS PLACEMENT (IOC) LEFT TECNIS TORIC ADD 5.62 00:46.5 30.0%;  Surgeon: Leandrew Koyanagi, MD;  Location: [place];  Service: Ophthalmology;  Laterality: Left;  diabetic - diet controlled  . CATARACT EXTRACTION W/PHACO Right 02/11/2019   Procedure: CATARACT EXTRACTION PHACO AND INTRAOCULAR LENS PLACEMENT (IOC) RIGHT DIABETIC 4.82 00:46.9 10.3%;  Surgeon: Leandrew Koyanagi, MD;  Location: [facility];  Service: Ophthalmology;  Laterality: Right;  Diabetic - diet controlled  . CHOLECYSTECTOMY    . COLONOSCOPY    . COLONOSCOPY WITH PROPOFOL N/A 05/11/2015   Procedure: COLONOSCOPY WITH PROPOFOL;  Surgeon: Manya Silvas, MD;  Location[facility];  Service: Endoscopy;  Laterality: N/A;  . ESOPHAGOGASTRODUODENOSCOPY    . ESOPHAGOGASTRODUODENOSCOPY (EGD) WITH PROPOFOL  05/11/2015   Procedure: ESOPHAGOGASTRODUODENOSCOPY (EGD) WITH PROPOFOL;  Surgeon: Manya Silvas, MD;  Location: [facility]COPY;  Service: Endoscopy;;  . FRACTURE SURGERY Right    arm and shoulder  . JOINT REPLACEMENT    . KNEE ARTHROSCOPY Left 03/14/2016   Procedure: ARTHROSCOPY KNEE, PARTIAL MEDIAL MENISECTOMY, CHONDROPLASTY;  Surgeon: Dereck Leep, MD;  Location: [facility];  Service: Orthopedics;  Laterality: Left;  Marland Kitchen MASTECTOMY Right 2019  . MASTECTOMY W/ SENTINEL NODE BIOPSY Right 06/03/2017   Procedure: MASTECTOMY WITH SENTINEL LYMPH NODE BIOPSY;  Surgeon: Robert Bellow, MD;  Location: [facility];  Service: General;  Laterality: Right;  . PARTIAL HIP ARTHROPLASTY Right   . REDUCTION MAMMAPLASTY Left 2019  . REMOVAL OF BILATERAL TISSUE EXPANDERS WITH PLACEMENT OF BILATERAL  BREAST IMPLANTS Right 11/06/2017   Procedure: REMOVAL OF TISSUE EXPANDER WITH PLACEMENT OF BREAST IMPLANT;  Surgeon: Wallace Going, DO;  Location: [facility];  Service: Plastics;  Laterality: Right;  . TONSILLECTOMY      SOCIAL HISTORY: Social History   Socioeconomic History  . Marital status: Married    Spouse name: Sonia Side  . Number of children: 2  . Years of education: Not on file  . Highest education level: Master's degree (e.g., MA, MS, MEng, MEd, MSW, MBA)  Occupational History  . Occupation: retired  Tobacco Use  . Smoking status: Former Smoker    Types: Cigarettes    Quit date: 1965    Years since quitting: 41.1  . Smokeless tobacco: Never Used  . Tobacco comment: smoked in college  Vaping Use  . Vaping Use: Never used  Substance and Sexual Activity  . Alcohol use: Yes    Alcohol/week: 7.0 - 14.0 standard drinks    Types: 7 - 14 Glasses of wine per week    Comment: 1-2 glasses of wine per night  . Drug use: No  . Sexual activity: Never  Other Topics Concern  . 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    Social Determinants of Health   Financial Resource Strain: Low Risk   .  Difficulty of Paying Living Expenses: Not hard at all  Food Insecurity: No Food Insecurity  . Worried About Charity fundraiser in the Last Year: Never true  . Ran Out of Food in the Last Year: Never true  Transportation Needs: No Transportation Needs  . Lack of Transportation (Medical): No  . Lack of Transportation (Non-Medical): No  Physical Activity: Insufficiently Active  . Days of Exercise per Week: 3 days  . Minutes of Exercise per Session: 30 min  Stress: No Stress Concern Present  . Feeling of Stress : Not at all  Social Connections: Socially Integrated  . Frequency of Communication with Friends and Family: More than three times a week  . Frequency of Social Gatherings with Friends  and Family: Three times a week  . Attends Religious Services: More than 4 times per year  . Active Member of Clubs or Organizations: Yes  . Attends Archivist Meetings: Never  . Marital Status: Married  Human resources officer Violence: Not At Risk  . Fear of Current or Ex-Partner: No  . Emotionally Abused: No  . Physically Abused: No  . Sexually Abused: No    FAMILY HISTORY: Family History  Problem Relation Age of Onset  . Cancer Mother        colon/rectal  . Colon cancer Mother   . Heart disease Father   . Hypertension Father   . Atrial fibrillation Father   . Diabetes Father   . Cancer Brother        lung  . Healthy Son   . Healthy Son   . Breast cancer Neg Hx     ALLERGIES:  is allergic to codeine, lorazepam, oxycodone, paregoric, penicillins, quinolones, and aleve [naproxen sodium].  MEDICATIONS:  Current Outpatient Medications  Medication Sig Dispense Refill  . acetaminophen (TYLENOL) 500 MG tablet Take 500 mg by mouth every 6 (six) hours as needed.    Marland Kitchen albuterol (PROVENTIL HFA;VENTOLIN HFA) 108 (90 Base) MCG/ACT inhaler Inhale 2 puffs into the lungs every 6 (six) hours as needed for wheezing or shortness of breath. 1 Inhaler 2  . ARIPiprazole (ABILIFY) 2 MG tablet TAKE 1 TABLET (2 MG TOTAL) BY MOUTH EVERY EVENING. 90 tablet 1  . aspirin 81 MG tablet Take 81 mg by mouth daily.    Marland Kitchen atorvastatin (LIPITOR) 40 MG tablet TAKE 1 TABLET BY MOUTH EVERY DAY 90 tablet 2  . bismuth subsalicylate (PEPTO BISMOL) 262 MG chewable tablet Chew 262 mg by mouth as needed. **capsule**    . calcium carbonate (TUMS - DOSED IN MG ELEMENTAL CALCIUM) 500 MG chewable tablet Chew 1 tablet by mouth as needed for indigestion or heartburn.    . clobetasol ointment (TEMOVATE) 0.05 % APPLY TO AFFECTED AREAS AT NIGHT AS NEEDED    . clotrimazole (LOTRIMIN) 1 % cream Apply 1 application topically 2 (two) times daily. 30 g 1  . ferrous sulfate 324 MG TBEC Take 324 mg by mouth daily with breakfast.     . fluocinonide (LIDEX) 0.05 % external solution APPLY TO AFFECTED AREAS OF SCALP DAILY UNTIL CLEAR, THEN FOR FLARES    . lisinopril (ZESTRIL) 5 MG tablet TAKE 1 TABLET BY MOUTH EVERY DAY 90 tablet 1  . Multiple Vitamins-Minerals (CENTRUM SILVER PO) Take 1 tablet by mouth daily.    Marland Kitchen omeprazole (PRILOSEC) 40 MG capsule Take 40 mg by mouth 2 (two) times daily.     Marland Kitchen oxybutynin (DITROPAN-XL) 10 MG 24 hr tablet Take 1 tablet (10 mg total)  by mouth daily. 90 tablet 1  . rosuvastatin (CRESTOR) 10 MG tablet Take 1 tablet (10 mg total) by mouth daily. 90 tablet 1  . tiotropium (SPIRIVA) 18 MCG inhalation capsule Place 1 capsule (18 mcg total) into inhaler and inhale daily. 90 capsule 3  . Venlafaxine HCl 225 MG TB24 TAKE 1 TABLET BY MOUTH EVERY DAY 90 tablet 0   No current facility-administered medications for this visit.   PHYSICAL EXAMINATION:   Vitals:   03/04/20 1339  BP: 129/68  Pulse: 84  Resp: 18  Temp: 98.8 F (37.1 C)  SpO2: 97%   Filed Weights   03/04/20 1339  Weight: 188 lb (85.3 kg)    Physical Exam HENT:     Head: Normocephalic and atraumatic.     Mouth/Throat:     Pharynx: No oropharyngeal exudate.  Eyes:     Pupils: Pupils are equal, round, and reactive to light.  Cardiovascular:     Rate and Rhythm: Normal rate and regular rhythm.  Pulmonary:     Effort: No respiratory distress.     Breath sounds: No wheezing.  Abdominal:     General: Bowel sounds are normal. There is no distension.     Palpations: Abdomen is soft. There is no mass.     Tenderness: There is no abdominal tenderness. There is no guarding or rebound.  Musculoskeletal:        General: No tenderness. Normal range of motion.     Cervical back: Normal range of motion and neck supple.  Skin:    General: Skin is warm.  Neurological:     Mental Status: She is alert and oriented to person, place, and time.  Psychiatric:        Mood and Affect: Affect normal.     LABORATORY DATA:  I have  reviewed the data as listed Lab Results  Component Value Date   WBC 6.7 03/04/2020   HGB 12.2 03/04/2020   HCT 38.4 03/04/2020   MCV 87.3 03/04/2020   PLT 343 03/04/2020   Recent Labs    08/05/19 0843  NA 144  K 4.7  CL 104  CO2 23  GLUCOSE 135*  BUN 17  CREATININE 1.11*  CALCIUM 9.8  GFRNONAA 47*  GFRAA 55*  PROT 6.7  ALBUMIN 4.5  AST 17  ALT 21  ALKPHOS 105  BILITOT 0.2     No results found.  Iron deficiency #Iron deficient anemia-unclear etiology.  Iron saturation is 10% ferritin is 44.  S/p Venofer-symptoms of fatigue improved; hemoglobin is 12.2.   #Hold Venofer today.  Continue p.o. iron.  # Chronic diarrhea- [1-4/day]; recommend eval with GI [Dr.Elliot-[place]-2017]. Recommend re-evaluation with GI. Will refer.   # DISPOSITION: # refer to Dr.Toledo re: chronic diarrhea.  # No venofer  # follow up in 6 months- cbc/iron studies/ferritin; possible Venofer- Dr.B   All questions were answered. The patient knows to call the clinic with any problems, questions or concerns.    Cammie Sickle, MD 03/04/2020 2:14 PM

## 2020-03-12 ENCOUNTER — Other Ambulatory Visit: Payer: Self-pay | Admitting: Family Medicine

## 2020-03-12 DIAGNOSIS — F4322 Adjustment disorder with anxiety: Secondary | ICD-10-CM

## 2020-03-12 NOTE — Telephone Encounter (Signed)
Requested medication (s) are due for refill today: yes  Requested medication (s) are on the active medication list: yes  Last refill:  09/03/19  Future visit scheduled: yes  Notes to clinic:  med not delegated to NT to RF   Requested Prescriptions  Pending Prescriptions Disp Refills   ARIPiprazole (ABILIFY) 2 MG tablet [Pharmacy Med Name: ARIPIPRAZOLE 2 MG TABLET] 90 tablet 1    Sig: TAKE 1 TABLET (2 MG TOTAL) BY MOUTH EVERY EVENING.      Not Delegated - Psychiatry:  Antipsychotics - Second Generation (Atypical) - aripiprazole Failed - 03/12/2020  9:02 AM      Failed - This refill cannot be delegated      Passed - Valid encounter within last 6 months    Recent Outpatient Visits           5 months ago Essential hypertension   [facility] [place], Dionne Bucy, MD   12 months ago Encounter for annual physical exam   [facility], Dionne Bucy, MD   1 year ago Pulmonary emphysema, unspecified emphysema type [facility])   [facility], Dionne Bucy, MD   1 year ago Primary osteoarthritis of left knee   [facility] [place], Dionne Bucy, MD   1 year ago Pain and swelling of left wrist   [place], Dionne Bucy, MD       Future Appointments             In 5 days [place], Dionne Bucy, MD [facility] [place]

## 2020-03-14 NOTE — Telephone Encounter (Signed)
LOV 9/21

## 2020-03-15 ENCOUNTER — Other Ambulatory Visit: Payer: Self-pay | Admitting: Family Medicine

## 2020-03-15 DIAGNOSIS — E1169 Type 2 diabetes mellitus with other specified complication: Secondary | ICD-10-CM

## 2020-03-16 NOTE — Progress Notes (Signed)
Subjective:   Natasha Vasquez is a 80 y.o. female who presents for Medicare Annual (Subsequent) preventive examination.  Review of Systems    N/A  Cardiac Risk Factors include: advanced age (>1men, >24 women);dyslipidemia;diabetes mellitus;hypertension;obesity (BMI >30kg/m2)     Objective:    Today's Vitals   03/17/20 1329  BP: 132/88  Pulse: 86  Temp: 98.6 F (37 C)  TempSrc: Oral  SpO2: 94%  Weight: 188 lb 6.4 oz (85.5 kg)  Height: 5\' 5"  (1.651 m)  PainSc: 0-No pain   Body mass index is 31.35 kg/m.  Advanced Directives 03/17/2020 07/13/2019 03/16/2019 02/27/2019 01/14/2019 11/28/2018 10/07/2018  Does Patient Have a Medical Advance Directive? Yes No Yes No Yes Yes Yes  Type of Paramedic of [place];Living will - [place];Living will - [place];Living will [place];Living will [place];Living will  Does patient want to make changes to medical advance directive? - - - - No - Patient declined No - Patient declined No - Patient declined  Copy of [place] in Chart? Yes - validated most recent copy scanned in chart (See row information) - Yes - validated most recent copy scanned in chart (See row information) - No - copy requested No - copy requested -  Would patient like information on creating a medical advance directive? - No - Patient declined - No - Patient declined - No - Patient declined -    Current Medications (verified) Outpatient Encounter Medications as of 03/17/2020  Medication Sig  . acetaminophen (TYLENOL) 500 MG tablet Take 500 mg by mouth every 6 (six) hours as needed.  Marland Kitchen albuterol (PROVENTIL HFA;VENTOLIN HFA) 108 (90 Base) MCG/ACT inhaler Inhale 2 puffs into the lungs every 6 (six) hours as needed for wheezing or shortness of breath.  . ARIPiprazole (ABILIFY) 2 MG tablet TAKE 1 TABLET (2 MG TOTAL) BY MOUTH EVERY EVENING.  Marland Kitchen aspirin 81 MG  tablet Take 81 mg by mouth daily.  Marland Kitchen atorvastatin (LIPITOR) 40 MG tablet TAKE 1 TABLET BY MOUTH EVERY DAY  . bismuth subsalicylate (PEPTO BISMOL) 262 MG chewable tablet Chew 262 mg by mouth as needed. **capsule**  . calcium carbonate (TUMS - DOSED IN MG ELEMENTAL CALCIUM) 500 MG chewable tablet Chew 1 tablet by mouth as needed for indigestion or heartburn.  . clindamycin (CLEOCIN) 150 MG capsule Take 4 capsules by mouth as directed. 1 hour prior to dental procedure  . clobetasol ointment (TEMOVATE) 0.05 % APPLY TO AFFECTED AREAS AT NIGHT AS NEEDED  . clotrimazole (LOTRIMIN) 1 % cream Apply 1 application topically 2 (two) times daily.  . ferrous sulfate 324 MG TBEC Take 324 mg by mouth daily with breakfast.  . fluocinonide (LIDEX) 0.05 % external solution APPLY TO AFFECTED AREAS OF SCALP DAILY UNTIL CLEAR, THEN FOR FLARES  . lisinopril (ZESTRIL) 5 MG tablet TAKE 1 TABLET BY MOUTH EVERY DAY  . Multiple Vitamins-Minerals (CENTRUM SILVER PO) Take 1 tablet by mouth daily.  Marland Kitchen omeprazole (PRILOSEC) 40 MG capsule Take 40 mg by mouth 2 (two) times daily.   Marland Kitchen oxybutynin (DITROPAN-XL) 10 MG 24 hr tablet Take 1 tablet (10 mg total) by mouth daily.  . Venlafaxine HCl 225 MG TB24 TAKE 1 TABLET BY MOUTH EVERY DAY  . rosuvastatin (CRESTOR) 10 MG tablet TAKE 1 TABLET BY MOUTH EVERY DAY (Patient not taking: Reported on 03/17/2020)  . tiotropium (SPIRIVA) 18 MCG inhalation capsule Place 1 capsule (18 mcg total) into  inhaler and inhale daily. (Patient not taking: Reported on 03/17/2020)   No facility-administered encounter medications on file as of 03/17/2020.    Allergies (verified) Codeine, Lorazepam, Oxycodone, Paregoric, Penicillins, Quinolones, and Aleve [naproxen sodium]   History: Past Medical History:  Diagnosis Date  . Anemia in pregnancy   . Arthritis    hands  . Barrett esophagus   . Bell palsy 04/24/2004  . 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  ER/PR NEGATIVE  . Cataract    . Chronic cough   . Complication of anesthesia    20 years ago pts b/p dropped really low, "coded"  . Depression   . Depression   . Diabetes mellitus without complication (HCC)    diet controlled  . Family history of adverse reaction to anesthesia    son PONV  . GERD (gastroesophageal reflux disease)   . Hypercholesteremia   . Hyperlipidemia   . Hypertension   . Melanoma ([place])   . MVP (mitral valve prolapse)    followed by PCP  . Nephritis   . Nephritis   . S/P appendectomy   . Thoracic aortic aneurysm ([place])   . Torn meniscus    left   Past Surgical History:  Procedure Laterality Date  . ABDOMINAL HYSTERECTOMY    . APPENDECTOMY    . BREAST BIOPSY Right 04/25/2017   retroareolar 10:00   wing clip   path pending  . BREAST BIOPSY Right 04/25/2017   10:00  5CMFN  venus clip    path pending  . BREAST BIOPSY Right 05/03/2017   Affirm Bx- path pending  . BREAST CYST ASPIRATION Bilateral    NEG  . BREAST EXCISIONAL BIOPSY Left 20+ yrs ago   NEG  . BREAST RECONSTRUCTION WITH PLACEMENT OF TISSUE EXPANDER AND FLEX HD (ACELLULAR HYDRATED DERMIS) Right 06/03/2017   Procedure: BREAST RECONSTRUCTION WITH PLACEMENT OF TISSUE EXPANDER AND FLEX HD (ACELLULAR HYDRATED DERMIS);  Surgeon: Wallace Going, DO;  Location: [facility];  Service: Plastics;  Laterality: Right;  . BREAST REDUCTION SURGERY Left 11/06/2017   Procedure: BREAST REDUCTION;  Surgeon: Wallace Going, DO;  Location: [facility];  Service: Plastics;  Laterality: Left;  . BROW LIFT Bilateral 02/01/2015   Procedure: BLEPHAROPLASTY bilateral upper eyelids.;  Surgeon: Karle Starch, MD;  Location: [place];  Service: Ophthalmology;  Laterality: Bilateral;  DIABETIC - diet controlled  . CATARACT EXTRACTION W/PHACO Left 01/14/2019   Procedure: CATARACT EXTRACTION PHACO AND INTRAOCULAR LENS PLACEMENT (IOC) LEFT TECNIS TORIC ADD 5.62 00:46.5 30.0%;  Surgeon: Leandrew Koyanagi, MD;  Location: [place];   Service: Ophthalmology;  Laterality: Left;  diabetic - diet controlled  . CATARACT EXTRACTION W/PHACO Right 02/11/2019   Procedure: CATARACT EXTRACTION PHACO AND INTRAOCULAR LENS PLACEMENT (IOC) RIGHT DIABETIC 4.82 00:46.9 10.3%;  Surgeon: Leandrew Koyanagi, MD;  Location: [place];  Service: Ophthalmology;  Laterality: Right;  Diabetic - diet controlled  . CHOLECYSTECTOMY    . COLONOSCOPY    . COLONOSCOPY WITH PROPOFOL N/A 05/11/2015   Procedure: COLONOSCOPY WITH PROPOFOL;  Surgeon: Manya Silvas, MD;  Location: [facility];  Service: Endoscopy;  Laterality: N/A;  . ESOPHAGOGASTRODUODENOSCOPY    . ESOPHAGOGASTRODUODENOSCOPY (EGD) WITH PROPOFOL  05/11/2015   Procedure: ESOPHAGOGASTRODUODENOSCOPY (EGD) WITH PROPOFOL;  Surgeon: Manya Silvas, MD;  Location: [facility] ENDOSCOPY;  Service: Endoscopy;;  . FRACTURE SURGERY Right    arm and shoulder  . JOINT REPLACEMENT    . KNEE ARTHROSCOPY Left 03/14/2016  Procedure: ARTHROSCOPY KNEE, PARTIAL MEDIAL MENISECTOMY, CHONDROPLASTY;  Surgeon: Dereck Leep, MD;  Location: [facility];  Service: Orthopedics;  Laterality: Left;  Marland Kitchen MASTECTOMY Right 2019  . MASTECTOMY W/ SENTINEL NODE BIOPSY Right 06/03/2017   Procedure: MASTECTOMY WITH SENTINEL LYMPH NODE BIOPSY;  Surgeon: Robert Bellow, MD;  Location: [facility];  Service: General;  Laterality: Right;  . PARTIAL HIP ARTHROPLASTY Right   . REDUCTION MAMMAPLASTY Left 2019  . REMOVAL OF BILATERAL TISSUE EXPANDERS WITH PLACEMENT OF BILATERAL BREAST IMPLANTS Right 11/06/2017   Procedure: REMOVAL OF TISSUE EXPANDER WITH PLACEMENT OF BREAST IMPLANT;  Surgeon: Wallace Going, DO;  Location: [facility];  Service: Plastics;  Laterality: Right;  . TONSILLECTOMY     Family History  Problem Relation Age of Onset  . Cancer Mother        colon/rectal  . Colon cancer Mother   . Heart disease Father   . Hypertension Father   . Atrial fibrillation Father   . Diabetes Father   . Cancer Brother         lung  . Healthy Son   . Healthy Son   . Breast cancer Neg Hx    Social History   Socioeconomic History  . Marital status: Married    Spouse name: Sonia Side  . Number of children: 2  . Years of education: Not on file  . Highest education level: Master's degree (e.g., MA, MS, MEng, MEd, MSW, MBA)  Occupational History  . Occupation: retired  Tobacco Use  . Smoking status: Former Smoker    Types: Cigarettes    Quit date: 1965    Years since quitting: 57.2  . Smokeless tobacco: Never Used  . Tobacco comment: smoked in college  Vaping Use  . Vaping Use: Never used  Substance and Sexual Activity  . Alcohol use: Yes    Alcohol/week: 7.0 - 14.0 standard drinks    Types: 7 - 14 Glasses of wine per week    Comment: 1-2 glasses of wine per night  . Drug use: No  . Sexual activity: Never  Other Topics Concern  . 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    Social Determinants of Health   Financial Resource Strain: Low Risk   . Difficulty of Paying Living Expenses: Not hard at all  Food Insecurity: No Food Insecurity  . Worried About Charity fundraiser in the Last Year: Never true  . Ran Out of Food in the Last Year: Never true  Transportation Needs: No Transportation Needs  . Lack of Transportation (Medical): No  . Lack of Transportation (Non-Medical): No  Physical Activity: Inactive  . Days of Exercise per Week: 0 days  . Minutes of Exercise per Session: 0 min  Stress: No Stress Concern Present  . Feeling of Stress : Not at all  Social Connections: Socially Integrated  . Frequency of Communication with Friends and Family: More than three times a week  . Frequency of Social Gatherings with Friends and Family: More than three times a week  . Attends Religious Services: More than 4 times per year  . Active Member of Clubs or Organizations: Yes  . Attends Archivist  Meetings: More than 4 times per year  . Marital Status: Married    Tobacco Counseling Counseling given: Not Answered Comment: smoked in college   Clinical Intake:  Pre-visit preparation completed: Yes  Pain :  No/denies pain Pain Score: 0-No pain     Nutritional Status: BMI > 30  Obese Nutritional Risks: Nausea/ vomitting/ diarrhea (Diarrhea 4-5 times a week. Unsure cause- will talk to PCP about this today.) Diabetes: Yes  How often do you need to have someone help you when you read instructions, pamphlets, or other written materials from your doctor or pharmacy?: 1 - Never  Diabetic? Yes  Nutrition Risk Assessment:  Has the patient had any N/V/D within the last 2 months?  No  Does the patient have any non-healing wounds?  No  Has the patient had any unintentional weight loss or weight gain?  No   Diabetes:  Is the patient diabetic?  Yes  If diabetic, was a CBG obtained today?  No  Did the patient bring in their glucometer from home?  No  How often do you monitor your CBG's? Does not check BS.   Financial Strains and Diabetes Management:  Are you having any financial strains with the device, your supplies or your medication? No .  Does the patient want to be seen by Chronic Care Management for management of their diabetes?  No  Would the patient like to be referred to a Nutritionist or for Diabetic Management?  No   Diabetic Exams:  Diabetic Eye Exam: Completed 09/28/19 Diabetic Foot Exam: Overdue, Pt has been advised about the importance in completing this exam.    Interpreter Needed?: No  Information entered by :: [facility], LPN   Activities of Daily Living In your present state of health, do you have any difficulty performing the following activities: 03/17/2020  Hearing? N  Vision? N  Difficulty concentrating or making decisions? N  Walking or climbing stairs? Y  Comment Due to left knee pain.  Dressing or bathing? N  Doing errands, shopping? N   Preparing Food and eating ? N  Using the Toilet? N  In the past six months, have you accidently leaked urine? Y  Comment Occasionally with pressure. Currently on Oxybutynin.  Do you have problems with loss of bowel control? N  Managing your Medications? N  Managing your Finances? N  Housekeeping or managing your Housekeeping? N  Some recent data might be hidden    Patient Care Team: Virginia Crews, MD as PCP - General (Family Medicine) Watt Climes, PA as Physician Assistant (Physician Assistant) Marry Guan Laurice Record, MD as Consulting Physician (Orthopedic Surgery) Bary Castilla Forest Gleason, MD (General Surgery) Dasher, Rayvon Char, MD (Dermatology) Grace Isaac, MD as Consulting Physician (Cardiothoracic Surgery) Cammie Sickle, MD as Consulting Physician (Internal Medicine) Leandrew Koyanagi, MD as Referring Physician (Ophthalmology)  Indicate any recent Medical Services you may have received from other than [facility] providers in the past year (date may be approximate).     Assessment:   This is a routine wellness examination for Natasha Vasquez.  Hearing/Vision screen No exam data present  Dietary issues and exercise activities discussed: Current Exercise Habits: The patient does not participate in regular exercise at present, Exercise limited by: None identified  Goals    . DIET - INCREASE WATER INTAKE     Recommend to increase water intake to 6-8 8 oz glasses a day.     . Exercise 3x per week (30 min per time)     Recommend to exercise for 3 days a week for at least 30 minutes at a time.        Depression Screen PHQ 2/9 Scores 03/17/2020 03/16/2019 03/12/2018 09/12/2017 06/12/2017 03/11/2017 01/24/2016  PHQ -  2 Score 0 1 2 2  0 3 0  PHQ- 9 Score - - 9 7 3 8  -    Fall Risk Fall Risk  03/17/2020 03/16/2019 07/28/2018 07/08/2018 06/24/2018  Falls in the past year? 0 0 0 0 1  Number falls in past yr: 0 0 0 - 0  Injury with Fall? 0 0 - - 0  Follow up - - - Falls evaluation completed -     FALL RISK PREVENTION PERTAINING TO THE HOME:  Any stairs in or around the home? Yes  If so, are there any without handrails? No  Home free of loose throw rugs in walkways, pet beds, electrical cords, etc? Yes  Adequate lighting in your home to reduce risk of falls? Yes   ASSISTIVE DEVICES UTILIZED TO PREVENT FALLS:  Life alert? Yes  Use of a cane, walker or w/c? No  Grab bars in the bathroom? Yes  Shower chair or bench in shower? Yes  Elevated toilet seat or a handicapped toilet? Yes   TIMED UP AND GO:  Was the test performed? Yes .  Length of time to ambulate 10 feet: 8 sec.   Gait steady and fast without use of assistive device  Cognitive Function:     6CIT Screen 03/12/2018 03/11/2017  What Year? 0 points 0 points  What month? 0 points 0 points  What time? 0 points 0 points  Count back from 20 0 points 0 points  Months in reverse 2 points 0 points  Repeat phrase 0 points 0 points  Total Score 2 0    Immunizations Immunization History  Administered Date(s) Administered  . Fluad Quad(high Dose 65+) 09/09/2018, 10/12/2019  . Influenza Split 10/24/2011  . Influenza, High Dose Seasonal PF 11/18/2013, 01/24/2016, 10/08/2016, 09/12/2017  . Influenza-Unspecified 10/24/2011, 10/14/2012, 11/18/2013  . Moderna Sars-Covid-2 Vaccination 01/30/2019, 03/02/2019  . Pneumococcal Conjugate-13 08/21/2013  . Pneumococcal Polysaccharide-23 01/24/2016  . Tdap 10/24/2011  . Zoster Recombinat (Shingrix) 04/01/2016, 08/14/2016    TDAP status: Up to date  Flu Vaccine status: Up to date  Pneumococcal vaccine status: Up to date  Covid-19 vaccine status: Completed vaccines  Qualifies for Shingles Vaccine? Yes   Zostavax completed No   Shingrix Completed?: Yes  Screening Tests Health Maintenance  Topic Date Due  . COVID-19 Vaccine (3 - Moderna risk 4-dose series) 03/30/2019  . HEMOGLOBIN A1C  02/05/2020  . FOOT EXAM  03/15/2020  . OPHTHALMOLOGY EXAM  09/27/2020  . DEXA  SCAN  03/09/2021  . TETANUS/TDAP  10/23/2021  . INFLUENZA VACCINE  Completed  . PNA vac Low Risk Adult  Completed  . HPV VACCINES  Aged Out    Health Maintenance  Health Maintenance Due  Topic Date Due  . COVID-19 Vaccine (3 - Moderna risk 4-dose series) 03/30/2019  . HEMOGLOBIN A1C  02/05/2020  . FOOT EXAM  03/15/2020    Colorectal cancer screening: No longer required.   Mammogram status: No longer required due to age.  Bone Density status: Completed 03/09/16. Results reflect: Bone density results: OSTEOPENIA. Repeat every 5 years.  Lung Cancer Screening: (Low Dose CT Chest recommended if Age 25-80 years, 30 pack-year currently smoking OR have quit w/in 15years.) does not qualify.   Additional Screening:  Vision Screening: Recommended annual ophthalmology exams for early detection of glaucoma and other disorders of the eye. Is the patient up to date with their annual eye exam?  Yes  Who is the provider or what is the name of the office in which  the patient attends annual eye exams? Dr Wallace Going @ [facility] If pt is not established with a provider, would they like to be referred to a provider to establish care? No .   Dental Screening: Recommended annual dental exams for proper oral hygiene  Community Resource Referral / Chronic Care Management: CRR required this visit?  No   CCM required this visit?  No      Plan:     I have personally reviewed and noted the following in the patient's chart:   . Medical and social history . Use of alcohol, tobacco or illicit drugs  . Current medications and supplements . Functional ability and status . Nutritional status . Physical activity . Advanced directives . List of other physicians . Hospitalizations, surgeries, and ER visits in previous 12 months . Vitals . Screenings to include cognitive, depression, and falls . Referrals and appointments  In addition, I have reviewed and discussed with patient certain preventive  protocols, quality metrics, and best practice recommendations. A written personalized care plan for preventive services as well as general preventive health recommendations were provided to patient.     Natasha Vasquez, [place]   07/17/2200   Nurse Notes: Pt needs a diabetic foot exam and Hgb A1c check today. Pt has received the Covid booster but does not have the updated information. Pt to call in or enter vaccine information on MyChart.

## 2020-03-17 ENCOUNTER — Other Ambulatory Visit: Payer: Self-pay

## 2020-03-17 ENCOUNTER — Encounter: Payer: Self-pay | Admitting: Family Medicine

## 2020-03-17 ENCOUNTER — Ambulatory Visit (INDEPENDENT_AMBULATORY_CARE_PROVIDER_SITE_OTHER): Payer: Medicare PPO

## 2020-03-17 ENCOUNTER — Ambulatory Visit (INDEPENDENT_AMBULATORY_CARE_PROVIDER_SITE_OTHER): Payer: Medicare PPO | Admitting: Family Medicine

## 2020-03-17 VITALS — BP 132/88 | Ht 65.0 in | Wt 188.0 lb

## 2020-03-17 VITALS — BP 132/88 | HR 86 | Temp 98.6°F | Ht 65.0 in | Wt 188.4 lb

## 2020-03-17 DIAGNOSIS — E1159 Type 2 diabetes mellitus with other circulatory complications: Secondary | ICD-10-CM | POA: Diagnosis not present

## 2020-03-17 DIAGNOSIS — Z0000 Encounter for general adult medical examination without abnormal findings: Secondary | ICD-10-CM | POA: Diagnosis not present

## 2020-03-17 DIAGNOSIS — Z6831 Body mass index (BMI) 31.0-31.9, adult: Secondary | ICD-10-CM | POA: Diagnosis not present

## 2020-03-17 DIAGNOSIS — M1712 Unilateral primary osteoarthritis, left knee: Secondary | ICD-10-CM

## 2020-03-17 DIAGNOSIS — E785 Hyperlipidemia, unspecified: Secondary | ICD-10-CM

## 2020-03-17 DIAGNOSIS — K529 Noninfective gastroenteritis and colitis, unspecified: Secondary | ICD-10-CM | POA: Diagnosis not present

## 2020-03-17 DIAGNOSIS — E1169 Type 2 diabetes mellitus with other specified complication: Secondary | ICD-10-CM

## 2020-03-17 DIAGNOSIS — E038 Other specified hypothyroidism: Secondary | ICD-10-CM

## 2020-03-17 DIAGNOSIS — I152 Hypertension secondary to endocrine disorders: Secondary | ICD-10-CM

## 2020-03-17 DIAGNOSIS — J439 Emphysema, unspecified: Secondary | ICD-10-CM

## 2020-03-17 DIAGNOSIS — M8580 Other specified disorders of bone density and structure, unspecified site: Secondary | ICD-10-CM | POA: Diagnosis not present

## 2020-03-17 DIAGNOSIS — E1142 Type 2 diabetes mellitus with diabetic polyneuropathy: Secondary | ICD-10-CM

## 2020-03-17 DIAGNOSIS — E669 Obesity, unspecified: Secondary | ICD-10-CM

## 2020-03-17 NOTE — Progress Notes (Signed)
Complete physical exam   Patient: Natasha Vasquez   DOB: 03-21-1940   80 y.o. Female  MRN: [id number] Visit Date: 03/17/2020  Today's healthcare provider: Lavon Paganini, MD   Chief Complaint  Patient presents with   Annual Exam   Subjective    Natasha Vasquez is a 80 y.o. female who presents today for a complete physical exam.  She reports consuming a general diet. The patient does not participate in regular exercise at present. She generally feels fairly well. She reports sleeping fairly well. She does have additional problems to discuss today.   Had AWV with [facility] today at 1:20pm.   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  ER/PR NEGATIVE   Cataract    Chronic cough    Complication of anesthesia    20 years ago pts b/p dropped really low, "coded"   Depression    Depression    Diabetes mellitus without complication (HCC)    diet controlled   Family history of adverse reaction to anesthesia    son PONV   GERD (gastroesophageal reflux disease)    Hypercholesteremia    Hyperlipidemia    Hypertension    Melanoma ([place])    MVP (mitral valve prolapse)    followed by PCP   Nephritis    Nephritis    S/P appendectomy    Thoracic aortic aneurysm ([place])    Torn meniscus    left   Past Surgical History:  Procedure Laterality Date   ABDOMINAL HYSTERECTOMY     APPENDECTOMY     BREAST BIOPSY Right 04/25/2017   retroareolar 10:00   wing clip   path pending   BREAST BIOPSY Right 04/25/2017   10:00  5CMFN  venus clip    path pending   BREAST BIOPSY Right 05/03/2017   Affirm Bx- path pending   BREAST CYST ASPIRATION Bilateral    NEG   BREAST EXCISIONAL BIOPSY Left 20+ yrs ago   NEG   BREAST RECONSTRUCTION WITH PLACEMENT OF TISSUE EXPANDER AND FLEX HD (ACELLULAR HYDRATED  DERMIS) Right 06/03/2017   Procedure: BREAST RECONSTRUCTION WITH PLACEMENT OF TISSUE EXPANDER AND FLEX HD (ACELLULAR HYDRATED DERMIS);  Surgeon: Wallace Going, DO;  Location: [facility];  Service: Plastics;  Laterality: Right;   BREAST REDUCTION SURGERY Left 11/06/2017   Procedure: BREAST REDUCTION;  Surgeon: Wallace Going, DO;  Location: [facility];  Service: Plastics;  Laterality: Left;   BROW LIFT Bilateral 02/01/2015   Procedure: BLEPHAROPLASTY bilateral upper eyelids.;  Surgeon: Karle Starch, MD;  Location: [place];  Service: Ophthalmology;  Laterality: Bilateral;  DIABETIC - diet controlled   CATARACT EXTRACTION W/PHACO Left 01/14/2019   Procedure: CATARACT EXTRACTION PHACO AND INTRAOCULAR LENS PLACEMENT (IOC) LEFT TECNIS TORIC ADD 5.62 00:46.5 30.0%;  Surgeon: Leandrew Koyanagi, MD;  Location: [place];  Service: Ophthalmology;  Laterality: Left;  diabetic - diet controlled   CATARACT EXTRACTION W/PHACO Right 02/11/2019   Procedure: CATARACT EXTRACTION PHACO AND INTRAOCULAR LENS PLACEMENT (IOC) RIGHT DIABETIC 4.82 00:46.9 10.3%;  Surgeon: Leandrew Koyanagi, MD;  Location: [place];  Service: Ophthalmology;  Laterality: Right;  Diabetic - diet controlled   CHOLECYSTECTOMY     COLONOSCOPY     COLONOSCOPY WITH PROPOFOL N/A 05/11/2015  Procedure: COLONOSCOPY WITH PROPOFOL;  Surgeon: Manya Silvas, MD;  Location: [facility];  Service: Endoscopy;  Laterality: N/A;   ESOPHAGOGASTRODUODENOSCOPY     ESOPHAGOGASTRODUODENOSCOPY (EGD) WITH PROPOFOL  05/11/2015   Procedure: ESOPHAGOGASTRODUODENOSCOPY (EGD) WITH PROPOFOL;  Surgeon: Manya Silvas, MD;  Location: [facility]COPY;  Service: Endoscopy;;   FRACTURE SURGERY Right    arm and shoulder   JOINT REPLACEMENT     KNEE ARTHROSCOPY Left 03/14/2016   Procedure: ARTHROSCOPY KNEE, PARTIAL MEDIAL MENISECTOMY, CHONDROPLASTY;  Surgeon: Dereck Leep, MD;  Location: [facility];  Service:  Orthopedics;  Laterality: Left;   MASTECTOMY Right 2019   MASTECTOMY W/ SENTINEL NODE BIOPSY Right 06/03/2017   Procedure: MASTECTOMY WITH SENTINEL LYMPH NODE BIOPSY;  Surgeon: Robert Bellow, MD;  Location: [facility];  Service: General;  Laterality: Right;   PARTIAL HIP ARTHROPLASTY Right    REDUCTION MAMMAPLASTY Left 2019   REMOVAL OF BILATERAL TISSUE EXPANDERS WITH PLACEMENT OF BILATERAL BREAST IMPLANTS Right 11/06/2017   Procedure: REMOVAL OF TISSUE EXPANDER WITH PLACEMENT OF BREAST IMPLANT;  Surgeon: Wallace Going, DO;  Location: [facility];  Service: Plastics;  Laterality: Right;   TONSILLECTOMY     Social History   Socioeconomic History   Marital status: Married    Spouse name: Sonia Side   Number of children: 2   Years of education: Not on file   Highest education level: Master's degree (e.g., MA, MS, MEng, MEd, MSW, MBA)  Occupational History   Occupation: retired  Tobacco Use   Smoking status: Former Smoker    Types: Cigarettes    Quit date: 1965    Years since quitting: 57.2   Smokeless tobacco: Never Used   Tobacco comment: smoked in Charity fundraiser Use: Never used  Substance and Sexual Activity   Alcohol use: Yes    Alcohol/week: 7.0 - 14.0 standard drinks    Types: 7 - 14 Glasses of wine per week    Comment: 1-2 glasses of wine per night   Drug use: No   Sexual activity: Never  Other Topics Concern   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    Social Determinants of Health   Financial Resource Strain: Low Risk    Difficulty of Paying Living Expenses: Not hard at all  Food Insecurity: No Food Insecurity   Worried About Charity fundraiser in the Last Year: Never true   [place] in the Last Year: Never true  Transportation Needs: No Transportation Needs   Lack of Transportation (Medical): No   Lack of Transportation  (Non-Medical): No  Physical Activity: Inactive   Days of Exercise per Week: 0 days   Minutes of Exercise per Session: 0 min  Stress: No Stress Concern Present   Feeling of Stress : Not at all  Social Connections: Socially Integrated   Frequency of Communication with Friends and Family: More than three times a week   Frequency of Social Gatherings with Friends and Family: More than three times a week   Attends Religious Services: More than 4 times per year   Active Member of [employer] or Organizations: Yes   Attends Music therapist: More than 4 times per year   Marital Status: Married  Human resources officer Violence: Not At Risk   Fear of Current or Ex-Partner: No   Emotionally Abused: No   Physically Abused: No  Sexually Abused: No   Family Status  Relation Name Status   Mother  Deceased   Father  Deceased   Brother  Deceased   Son  Alive   Son  Alive   Neg Hx  (Not Specified)   Family History  Problem Relation Age of Onset   Cancer Mother        colon/rectal   Colon cancer Mother    Heart disease Father    Hypertension Father    Atrial fibrillation Father    Diabetes Father    Cancer Brother        lung   Healthy Son    Healthy Son    Breast cancer Neg Hx    Allergies  Allergen Reactions   Codeine Other (See Comments)    Chest pain   Lorazepam Other (See Comments)    "Feels loopy"   Oxycodone Other (See Comments)    MENTAL CHANGES   Paregoric     Chest pain---tolerated MORPHINE   Penicillins Hives    Has patient had a PCN reaction causing immediate rash, facial/tongue/throat swelling, SOB or lightheadedness with hypotension: Yes Has patient had a PCN reaction causing severe rash involving mucus membranes or skin necrosis: No Has patient had a PCN reaction that required hospitalization: NO Has patient had a PCN reaction occurring within the last 10 years: No If all of the above answers are "NO", then may proceed with  Cephalosporin use.  Can take Augmentin   Quinolones     PATIENT UNAWARE OF allergy to this class of antibiotics, only aware of allergy to [facility] Patient was warned about not using Cipro and similar antibiotics. Recent studies have raised concern that fluoroquinolone antibiotics could be associated with an increased risk of aortic aneurysm Fluoroquinolones have non-antimicrobial properties that might jeopardise the integrity of the extracellular matrix of the vascular wall In a  propensity score matched cohort study in [place], there was a 66% increased rate   Aleve [Naproxen Sodium] Hives, Rash and Other (See Comments)    headaches    Patient Care Team: Virginia Crews, MD as PCP - General (Family Medicine) Watt Climes, PA as Physician Assistant (Physician Assistant) Marry Guan, Laurice Record, MD as Consulting Physician (Orthopedic Surgery) Bary Castilla, Forest Gleason, MD (General Surgery) Dasher, Rayvon Char, MD (Dermatology) Grace Isaac, MD as Consulting Physician (Cardiothoracic Surgery) Cammie Sickle, MD as Consulting Physician (Internal Medicine) Leandrew Koyanagi, MD as Referring Physician (Ophthalmology)   Medications: Outpatient Medications Prior to Visit  Medication Sig   acetaminophen (TYLENOL) 500 MG tablet Take 500 mg by mouth every 6 (six) hours as needed.   albuterol (PROVENTIL HFA;VENTOLIN HFA) 108 (90 Base) MCG/ACT inhaler Inhale 2 puffs into the lungs every 6 (six) hours as needed for wheezing or shortness of breath.   ARIPiprazole (ABILIFY) 2 MG tablet TAKE 1 TABLET (2 MG TOTAL) BY MOUTH EVERY EVENING.   aspirin 81 MG tablet Take 81 mg by mouth daily.   atorvastatin (LIPITOR) 40 MG tablet TAKE 1 TABLET BY MOUTH EVERY DAY   bismuth subsalicylate (PEPTO BISMOL) 262 MG chewable tablet Chew 262 mg by mouth as needed. **capsule**   calcium carbonate (TUMS - DOSED IN MG ELEMENTAL CALCIUM) 500 MG chewable tablet Chew 1 tablet by mouth as needed for indigestion or  heartburn.   clindamycin (CLEOCIN) 150 MG capsule Take 4 capsules by mouth as directed. 1 hour prior to dental procedure   clobetasol ointment (TEMOVATE) 0.05 % APPLY TO AFFECTED AREAS AT NIGHT  AS NEEDED   clotrimazole (LOTRIMIN) 1 % cream Apply 1 application topically 2 (two) times daily.   ferrous sulfate 324 MG TBEC Take 324 mg by mouth daily with breakfast.   fluocinonide (LIDEX) 0.05 % external solution APPLY TO AFFECTED AREAS OF SCALP DAILY UNTIL CLEAR, THEN FOR FLARES   lisinopril (ZESTRIL) 5 MG tablet TAKE 1 TABLET BY MOUTH EVERY DAY   Multiple Vitamins-Minerals (CENTRUM SILVER PO) Take 1 tablet by mouth daily.   omeprazole (PRILOSEC) 40 MG capsule Take 40 mg by mouth 2 (two) times daily.    oxybutynin (DITROPAN-XL) 10 MG 24 hr tablet Take 1 tablet (10 mg total) by mouth daily.   rosuvastatin (CRESTOR) 10 MG tablet TAKE 1 TABLET BY MOUTH EVERY DAY (Patient not taking: Reported on 03/17/2020)   Venlafaxine HCl 225 MG TB24 TAKE 1 TABLET BY MOUTH EVERY DAY   [DISCONTINUED] tiotropium (SPIRIVA) 18 MCG inhalation capsule Place 1 capsule (18 mcg total) into inhaler and inhale daily. (Patient not taking: Reported on 03/17/2020)   No facility-administered medications prior to visit.    Review of Systems  Constitutional: Negative for chills, fatigue and fever.  HENT: Negative for congestion, ear pain, rhinorrhea, sneezing and sore throat.   Eyes: Positive for photophobia. Negative for pain and redness.  Respiratory: Negative for cough, shortness of breath and wheezing.   Cardiovascular: Negative for chest pain and leg swelling.  Gastrointestinal: Positive for diarrhea. Negative for abdominal pain, blood in stool, constipation and nausea.  Endocrine: Negative for polydipsia and polyphagia.  Genitourinary: Negative.  Negative for dysuria, flank pain, hematuria, pelvic pain, vaginal bleeding and vaginal discharge.  Musculoskeletal: Positive for arthralgias (left knee pain for several  years). Negative for back pain, gait problem and joint swelling.  Skin: Negative for rash.  Neurological: Negative.  Negative for dizziness, tremors, seizures, weakness, light-headedness, numbness and headaches.  Hematological: Negative for adenopathy.  Psychiatric/Behavioral: Negative.  Negative for behavioral problems, confusion and dysphoric mood. The patient is not nervous/anxious and is not hyperactive.       Objective    BP 132/88    Ht 5\' 5"  (1.651 m)    Wt 188 lb (85.3 kg)    BMI 31.28 kg/m    Physical Exam Vitals reviewed.  Constitutional:      General: She is not in acute distress.    Appearance: Normal appearance. She is well-developed. She is not diaphoretic.  HENT:     Head: Normocephalic and atraumatic.     Right Ear: Tympanic membrane, ear canal and external ear normal.     Left Ear: Tympanic membrane, ear canal and external ear normal.  Eyes:     General: No scleral icterus.    Conjunctiva/sclera: Conjunctivae normal.     Pupils: Pupils are equal, round, and reactive to light.  Neck:     Thyroid: No thyromegaly.  Cardiovascular:     Rate and Rhythm: Normal rate and regular rhythm.     Pulses: Normal pulses.     Heart sounds: Normal heart sounds. No murmur heard.   Pulmonary:     Effort: Pulmonary effort is normal. No respiratory distress.     Breath sounds: Normal breath sounds. No wheezing or rales.  Abdominal:     General: There is no distension.     Palpations: Abdomen is soft.     Tenderness: There is no abdominal tenderness.  Musculoskeletal:        General: No deformity.     Cervical back: Neck supple.  Right lower leg: No edema.     Left lower leg: No edema.  Lymphadenopathy:     Cervical: No cervical adenopathy.  Skin:    General: Skin is warm and dry.     Findings: No rash.  Neurological:     Mental Status: She is alert and oriented to person, place, and time. Mental status is at baseline.     Gait: Gait normal.  Psychiatric:         Mood and Affect: Mood normal.        Behavior: Behavior normal.        Thought Content: Thought content normal.       Last depression screening scores PHQ 2/9 Scores 03/17/2020 03/17/2020 03/16/2019  PHQ - 2 Score 0 0 1  PHQ- 9 Score 1 - -   Last fall risk screening Fall Risk  03/17/2020  Falls in the past year? 0  Number falls in past yr: 0  Injury with Fall? 0  Follow up -   Last Audit-C alcohol use screening Alcohol Use Disorder Test (AUDIT) 03/17/2020  1. How often do you have a drink containing alcohol? 4  2. How many drinks containing alcohol do you have on a typical day when you are drinking? 0  3. How often do you have six or more drinks on one occasion? 0  AUDIT-C Score 4  4. How often during the last year have you found that you were not able to stop drinking once you had started? 0  5. How often during the last year have you failed to do what was normally expected from you because of drinking? 0  6. How often during the last year have you needed a first drink in the morning to get yourself going after a heavy drinking session? 0  7. How often during the last year have you had a feeling of guilt of remorse after drinking? 0  8. How often during the last year have you been unable to remember what happened the night before because you had been drinking? 0  9. Have you or someone else been injured as a result of your drinking? 0  10. Has a relative or friend or a doctor or another health worker been concerned about your drinking or suggested you cut down? 0  Alcohol Use Disorder Identification Test Final Score (AUDIT) 4  Alcohol Brief Interventions/Follow-up AUDIT Score <7 follow-up not indicated   A score of 3 or more in women, and 4 or more in men indicates increased risk for alcohol abuse, EXCEPT if all of the points are from question 1   No results found for any visits on 03/17/20.  Assessment & Plan    Routine Health Maintenance and Physical Exam  Exercise Activities and  Dietary recommendations Goals     DIET - INCREASE WATER INTAKE     Recommend to increase water intake to 6-8 8 oz glasses a day.      Exercise 3x per week (30 min per time)     Recommend to exercise for 3 days a week for at least 30 minutes at a time.         Immunization History  Administered Date(s) Administered   Fluad Quad(high Dose 65+) 09/09/2018, 10/12/2019   Influenza Split 10/24/2011   Influenza, High Dose Seasonal PF 11/18/2013, 01/24/2016, 10/08/2016, 09/12/2017   Influenza-Unspecified 10/24/2011, 10/14/2012, 11/18/2013   Moderna Sars-Covid-2 Vaccination 01/30/2019, 03/02/2019   Pneumococcal Conjugate-13 08/21/2013   Pneumococcal Polysaccharide-23 01/24/2016  Tdap 10/24/2011   Zoster Recombinat (Shingrix) 04/01/2016, 08/14/2016    Health Maintenance  Topic Date Due   COVID-19 Vaccine (3 - Moderna risk 4-dose series) 03/30/2019   HEMOGLOBIN A1C  02/05/2020   OPHTHALMOLOGY EXAM  09/27/2020   DEXA SCAN  03/09/2021   FOOT EXAM  03/17/2021   TETANUS/TDAP  10/23/2021   INFLUENZA VACCINE  Completed   PNA vac Low Risk Adult  Completed   HPV VACCINES  Aged Out    Discussed health benefits of physical activity, and encouraged her to engage in regular exercise appropriate for her age and condition.  Problem List Items Addressed This Visit      Cardiovascular and Mediastinum   Hypertension associated with diabetes ([place])    Well controlled Continue current medications Recheck metabolic panel F/u in 6 months       Relevant Orders   Comprehensive metabolic panel     Respiratory   Emphysema lung (HCC)    Chronic and stable Continue Spiriva Albuterol as needed Mucinex as needed Follow-up in 6 to 12 months        Digestive   Chronic diarrhea    Longstanding with normal colonoscopies Would like to see GI Referral placed today      Relevant Orders   Ambulatory referral to Gastroenterology     Endocrine   Hyperlipidemia associated  with type 2 diabetes mellitus ([place])    LDL was elevated at last check Continue Crestor Continue co-Q10 Repeat FLP and CMP Goal LDL less than 70      Relevant Orders   Lipid panel   Comprehensive metabolic panel   Subclinical hypothyroidism    Not on medication Recheck TSH and free T4      Relevant Orders   TSH + free T4   Type 2 diabetes mellitus (HCC)    Previously well controlled with diet no medications Associated with HTN and HLD and peripheral neuropathy Up-to-date on screenings and vaccines On ACE inhibitor and statin Discussed diet exercise Recheck A1c Follow-up in 6 months      Relevant Orders   Hemoglobin A1c   Diabetic peripheral neuropathy associated with type 2 diabetes mellitus ([place])    Ongoing with intermittent numbness of bilateral toes Continue regular self foot exams Consider gabapentin in the future, but patient declines at this time        Musculoskeletal and Integument   Osteopenia    Recheck DEXA      Relevant Orders   DG Bone Density   Primary osteoarthritis of left knee    Worsening chronic pain Would like to see orthopedics and consider total knee replacement Referral placed today to Dr. Marry Guan      Relevant Orders   Ambulatory referral to Orthopedic Surgery     Other   Class 1 obesity with serious comorbidity and body mass index (BMI) of 31.0 to 31.9 in adult    Discussed importance of healthy weight management Discussed diet and exercise        Other Visit Diagnoses    Encounter for annual physical exam    -  Primary   Relevant Orders   Lipid panel   Comprehensive metabolic panel   Hemoglobin A1c   TSH + free T4   DG Bone Density       Return in about 6 months (around 09/17/2020) for chronic disease f/u.     I, Lavon Paganini, MD, have reviewed all documentation for this visit. The documentation on 03/18/20 for the exam, diagnosis,  procedures, and orders are all accurate and complete.   Jullien Granquist, Dionne Bucy, MD,  [facility]

## 2020-03-17 NOTE — Patient Instructions (Signed)
[name] , Thank you for taking time to come for your Medicare Wellness Visit. I appreciate your ongoing commitment to your health goals. Please review the following plan we discussed and let me know if I can assist you in the future.   Screening recommendations/referrals: Colonoscopy: No longer required.  Mammogram: No longer required.  Bone Density: Up to date, due 03/09/21 Recommended yearly ophthalmology/optometry visit for glaucoma screening and checkup Recommended yearly dental visit for hygiene and checkup  Vaccinations: Influenza vaccine: Done 10/12/19 Pneumococcal vaccine: Completed series Tdap vaccine: Up to date, due 10/2021 Shingles vaccine: Completed series    Advanced directives: Currently on file.  Conditions/risks identified: Recommend to exercise for 3 days a week for at least 30 minutes at a time. Also recommend to increase water intake to 6-8 8 oz glasses a day.   Next appointment: 2:00 PM today with Dr Natasha Vasquez    Preventive Care 80 Years and Older, Female Preventive care refers to lifestyle choices and visits with your health care provider that can promote health and wellness. What does preventive care include?  A yearly physical exam. This is also called an annual well check.  Dental exams once or twice a year.  Routine eye exams. Ask your health care provider how often you should have your eyes checked.  Personal lifestyle choices, including:  Daily care of your teeth and gums.  Regular physical activity.  Eating a healthy diet.  Avoiding tobacco and drug use.  Limiting alcohol use.  Practicing safe sex.  Taking low-dose aspirin every day.  Taking vitamin and mineral supplements as recommended by your health care provider. What happens during an annual well check? The services and screenings done by your health care provider during your annual well check will depend on your age, overall health, lifestyle risk factors, and family history of  disease. Counseling  Your health care provider may ask you questions about your:  Alcohol use.  Tobacco use.  Drug use.  Emotional well-being.  Home and relationship well-being.  Sexual activity.  Eating habits.  History of falls.  Memory and ability to understand (cognition).  Work and work Statistician.  Reproductive health. Screening  You may have the following tests or measurements:  Height, weight, and BMI.  Blood pressure.  Lipid and cholesterol levels. These may be checked every 5 years, or more frequently if you are over 69 years old.  Skin check.  Lung cancer screening. You may have this screening every year starting at age 80 if you have a 30-pack-year history of smoking and currently smoke or have quit within the past 15 years.  Fecal occult blood test (FOBT) of the stool. You may have this test every year starting at age 80.  Flexible sigmoidoscopy or colonoscopy. You may have a sigmoidoscopy every 5 years or a colonoscopy every 10 years starting at age 80.  Hepatitis C blood test.  Hepatitis B blood test.  Sexually transmitted disease (STD) testing.  Diabetes screening. This is done by checking your blood sugar (glucose) after you have not eaten for a while (fasting). You may have this done every 1-3 years.  Bone density scan. This is done to screen for osteoporosis. You may have this done starting at age 80.  Mammogram. This may be done every 1-2 years. Talk to your health care provider about how often you should have regular mammograms. Talk with your health care provider about your test results, treatment options, and if necessary, the need for more tests. Vaccines  Your health care provider may recommend certain vaccines, such as:  Influenza vaccine. This is recommended every year.  Tetanus, diphtheria, and acellular pertussis (Tdap, Td) vaccine. You may need a Td booster every 10 years.  Zoster vaccine. You may need this after age  80.  Pneumococcal 13-valent conjugate (PCV13) vaccine. One dose is recommended after age 19.  Pneumococcal polysaccharide (PPSV23) vaccine. One dose is recommended after age 2. Talk to your health care provider about which screenings and vaccines you need and how often you need them. This information is not intended to replace advice given to you by your health care provider. Make sure you discuss any questions you have with your health care provider. Document Released: 01/28/2015 Document Revised: 09/21/2015 Document Reviewed: 11/02/2014 Elsevier Interactive Patient Education  2017 [place] Prevention in the Home Falls can cause injuries. They can happen to people of all ages. There are many things you can do to make your home safe and to help prevent falls. What can I do on the outside of my home?  Regularly fix the edges of walkways and driveways and fix any cracks.  Remove anything that might make you trip as you walk through a door, such as a raised step or threshold.  Trim any bushes or trees on the path to your home.  Use bright outdoor lighting.  Clear any walking paths of anything that might make someone trip, such as rocks or tools.  Regularly check to see if handrails are loose or broken. Make sure that both sides of any steps have handrails.  Any raised decks and porches should have guardrails on the edges.  Have any leaves, snow, or ice cleared regularly.  Use sand or salt on walking paths during winter.  Clean up any spills in your garage right away. This includes oil or grease spills. What can I do in the bathroom?  Use night lights.  Install grab bars by the toilet and in the tub and shower. Do not use towel bars as grab bars.  Use non-skid mats or decals in the tub or shower.  If you need to sit down in the shower, use a plastic, non-slip stool.  Keep the floor dry. Clean up any water that spills on the floor as soon as it happens.  Remove  soap buildup in the tub or shower regularly.  Attach bath mats securely with double-sided non-slip rug tape.  Do not have throw rugs and other things on the floor that can make you trip. What can I do in the bedroom?  Use night lights.  Make sure that you have a light by your bed that is easy to reach.  Do not use any sheets or blankets that are too big for your bed. They should not hang down onto the floor.  Have a firm chair that has side arms. You can use this for support while you get dressed.  Do not have throw rugs and other things on the floor that can make you trip. What can I do in the kitchen?  Clean up any spills right away.  Avoid walking on wet floors.  Keep items that you use a lot in easy-to-reach places.  If you need to reach something above you, use a strong step stool that has a grab bar.  Keep electrical cords out of the way.  Do not use floor polish or wax that makes floors slippery. If you must use wax, use non-skid floor wax.  Do  not have throw rugs and other things on the floor that can make you trip. What can I do with my stairs?  Do not leave any items on the stairs.  Make sure that there are handrails on both sides of the stairs and use them. Fix handrails that are broken or loose. Make sure that handrails are as long as the stairways.  Check any carpeting to make sure that it is firmly attached to the stairs. Fix any carpet that is loose or worn.  Avoid having throw rugs at the top or bottom of the stairs. If you do have throw rugs, attach them to the floor with carpet tape.  Make sure that you have a light switch at the top of the stairs and the bottom of the stairs. If you do not have them, ask someone to add them for you. What else can I do to help prevent falls?  Wear shoes that:  Do not have high heels.  Have rubber bottoms.  Are comfortable and fit you well.  Are closed at the toe. Do not wear sandals.  If you use a  stepladder:  Make sure that it is fully opened. Do not climb a closed stepladder.  Make sure that both sides of the stepladder are locked into place.  Ask someone to hold it for you, if possible.  Clearly mark and make sure that you can see:  Any grab bars or handrails.  First and last steps.  Where the edge of each step is.  Use tools that help you move around (mobility aids) if they are needed. These include:  Canes.  Walkers.  Scooters.  Crutches.  Turn on the lights when you go into a dark area. Replace any light bulbs as soon as they burn out.  Set up your furniture so you have a clear path. Avoid moving your furniture around.  If any of your floors are uneven, fix them.  If there are any pets around you, be aware of where they are.  Review your medicines with your doctor. Some medicines can make you feel dizzy. This can increase your chance of falling. Ask your doctor what other things that you can do to help prevent falls. This information is not intended to replace advice given to you by your health care provider. Make sure you discuss any questions you have with your health care provider. Document Released: 10/28/2008 Document Revised: 06/09/2015 Document Reviewed: 02/05/2014 Elsevier Interactive Patient Education  2017 [employer].

## 2020-03-17 NOTE — Patient Instructions (Signed)
Preventive Care 23 Years and Older, Female Preventive care refers to lifestyle choices and visits with your health care provider that can promote health and wellness. This includes:  A yearly physical exam. This is also called an annual wellness visit.  Regular dental and eye exams.  Immunizations.  Screening for certain conditions.  Healthy lifestyle choices, such as: ? Eating a healthy diet. ? Getting regular exercise. ? Not using drugs or products that contain nicotine and tobacco. ? Limiting alcohol use. What can I expect for my preventive care visit? Physical exam Your health care provider will check your:  Height and weight. These may be used to calculate your BMI (body mass index). BMI is a measurement that tells if you are at a healthy weight.  Heart rate and blood pressure.  Body temperature.  Skin for abnormal spots. Counseling Your health care provider may ask you questions about your:  Past medical problems.  Family's medical history.  Alcohol, tobacco, and drug use.  Emotional well-being.  Home life and relationship well-being.  Sexual activity.  Diet, exercise, and sleep habits.  History of falls.  Memory and ability to understand (cognition).  Work and work Statistician.  Pregnancy and menstrual history.  Access to firearms. What immunizations do I need? Vaccines are usually given at various ages, according to a schedule. Your health care provider will recommend vaccines for you based on your age, medical history, and lifestyle or other factors, such as travel or where you work.   What tests do I need? Blood tests  Lipid and cholesterol levels. These may be checked every 5 years, or more often depending on your overall health.  Hepatitis C test.  Hepatitis B test. Screening  Lung cancer screening. You may have this screening every year starting at age 50 if you have a 30-pack-year history of smoking and currently smoke or have quit within  the past 15 years.  Colorectal cancer screening. ? All adults should have this screening starting at age 43 and continuing until age 93. ? Your health care provider may recommend screening at age 63 if you are at increased risk. ? You will have tests every 1-10 years, depending on your results and the type of screening test.  Diabetes screening. ? This is done by checking your blood sugar (glucose) after you have not eaten for a while (fasting). ? You may have this done every 1-3 years.  Mammogram. ? This may be done every 1-2 years. ? Talk with your health care provider about how often you should have regular mammograms.  Abdominal aortic aneurysm (AAA) screening. You may need this if you are a current or former smoker.  BRCA-related cancer screening. This may be done if you have a family history of breast, ovarian, tubal, or peritoneal cancers. Other tests  STD (sexually transmitted disease) testing, if you are at risk.  Bone density scan. This is done to screen for osteoporosis. You may have this done starting at age 44. Talk with your health care provider about your test results, treatment options, and if necessary, the need for more tests. Follow these instructions at home: Eating and drinking  Eat a diet that includes fresh fruits and vegetables, whole grains, lean protein, and low-fat dairy products. Limit your intake of foods with high amounts of sugar, saturated fats, and salt.  Take vitamin and mineral supplements as recommended by your health care provider.  Do not drink alcohol if your health care provider tells you not to drink.  If you drink alcohol: ? Limit how much you have to 0-1 drink a day. ? Be aware of how much alcohol is in your drink. In the [place] one drink equals one 12 oz bottle of beer (355 mL), one 5 oz glass of wine (148 mL), or one 1 oz glass of hard liquor (44 mL).   Lifestyle  Take daily care of your teeth and gums. Brush your teeth every morning  and night with fluoride toothpaste. Floss one time each day.  Stay active. Exercise for at least 30 minutes 5 or more days each week.  Do not use any products that contain nicotine or tobacco, such as cigarettes, e-cigarettes, and chewing tobacco. If you need help quitting, ask your health care provider.  Do not use drugs.  If you are sexually active, practice safe sex. Use a condom or other form of protection in order to prevent STIs (sexually transmitted infections).  Talk with your health care provider about taking a low-dose aspirin or statin.  Find healthy ways to cope with stress, such as: ? Meditation, yoga, or listening to music. ? Journaling. ? Talking to a trusted person. ? Spending time with friends and family. Safety  Always wear your seat belt while driving or riding in a vehicle.  Do not drive: ? If you have been drinking alcohol. Do not ride with someone who has been drinking. ? When you are tired or distracted. ? While texting.  Wear a helmet and other protective equipment during sports activities.  If you have firearms in your house, make sure you follow all gun safety procedures. What's next?  Visit your health care provider once a year for an annual wellness visit.  Ask your health care provider how often you should have your eyes and teeth checked.  Stay up to date on all vaccines. This information is not intended to replace advice given to you by your health care provider. Make sure you discuss any questions you have with your health care provider. Document Revised: 12/23/2019 Document Reviewed: 12/26/2017 Elsevier Patient Education  2021 Elsevier Inc.  

## 2020-03-18 NOTE — Assessment & Plan Note (Signed)
Worsening chronic pain Would like to see orthopedics and consider total knee replacement Referral placed today to Dr. Marry Guan

## 2020-03-18 NOTE — Assessment & Plan Note (Signed)
Discussed importance of healthy weight management Discussed diet and exercise  

## 2020-03-18 NOTE — Assessment & Plan Note (Signed)
Chronic and stable Continue Spiriva Albuterol as needed Mucinex as needed Follow-up in 6 to 12 months

## 2020-03-18 NOTE — Assessment & Plan Note (Signed)
Longstanding with normal colonoscopies Would like to see GI Referral placed today

## 2020-03-18 NOTE — Assessment & Plan Note (Signed)
Previously well controlled with diet no medications Associated with HTN and HLD and peripheral neuropathy Up-to-date on screenings and vaccines On ACE inhibitor and statin Discussed diet exercise Recheck A1c Follow-up in 6 months

## 2020-03-18 NOTE — Assessment & Plan Note (Signed)
Ongoing with intermittent numbness of bilateral toes Continue regular self foot exams Consider gabapentin in the future, but patient declines at this time

## 2020-03-18 NOTE — Assessment & Plan Note (Signed)
Recheck DEXA 

## 2020-03-18 NOTE — Assessment & Plan Note (Signed)
LDL was elevated at last check Continue Crestor Continue co-Q10 Repeat FLP and CMP Goal LDL less than 70

## 2020-03-18 NOTE — Assessment & Plan Note (Signed)
Not on medication Recheck TSH and free T4

## 2020-03-18 NOTE — Assessment & Plan Note (Signed)
Well controlled Continue current medications Recheck metabolic panel F/u in 6 months  

## 2020-03-21 ENCOUNTER — Encounter: Payer: Self-pay | Admitting: *Deleted

## 2020-03-21 ENCOUNTER — Other Ambulatory Visit: Payer: Self-pay | Admitting: Family Medicine

## 2020-03-21 ENCOUNTER — Encounter: Payer: Self-pay | Admitting: Family Medicine

## 2020-03-21 MED ORDER — OMEPRAZOLE 40 MG PO CPDR: 40.0000 mg | DELAYED_RELEASE_CAPSULE | Freq: Two times a day (BID) | ORAL | 1 refills | Status: DC

## 2020-03-21 NOTE — Telephone Encounter (Signed)
   Notes to clinic: script has expired and was last prescribed by  historical provider  Review for continue use   Requested Prescriptions  Pending Prescriptions Disp Refills   omeprazole (PRILOSEC) 40 MG capsule      Sig: Take 1 capsule (40 mg total) by mouth 2 (two) times daily.      Gastroenterology: Proton Pump Inhibitors Passed - 03/21/2020 10:59 AM      Passed - Valid encounter within last 12 months    Recent Outpatient Visits           4 days ago Encounter for annual physical exam   [facility], Dionne Bucy, MD   6 months ago Essential hypertension   [place], Dionne Bucy, MD   1 year ago Encounter for annual physical exam   [facility], Dionne Bucy, MD   1 year ago Pulmonary emphysema, unspecified emphysema type [facility])   [facility], Dionne Bucy, MD   1 year ago Primary osteoarthritis of left knee   [place], Dionne Bucy, MD       Future Appointments             In 6 months Bacigalupo, Dionne Bucy, MD [facility], [place]   In 1 year Bacigalupo, Dionne Bucy, MD [facility], [facility]

## 2020-03-21 NOTE — Telephone Encounter (Signed)
Medication Refill - Medication:   omeprazole (PRILOSEC) 40 MG capsule  Patient is completely out of medication for a few days    Preferred Pharmacy (with phone number or street name):  [id number] [place] [address] Phone:  [phone]  Fax:  [phone]       Agent: Please be advised that RX refills may take up to 3 business days. We ask that you follow-up with your pharmacy.

## 2020-03-25 DIAGNOSIS — E1142 Type 2 diabetes mellitus with diabetic polyneuropathy: Secondary | ICD-10-CM | POA: Diagnosis not present

## 2020-03-25 DIAGNOSIS — E038 Other specified hypothyroidism: Secondary | ICD-10-CM | POA: Diagnosis not present

## 2020-03-25 DIAGNOSIS — I152 Hypertension secondary to endocrine disorders: Secondary | ICD-10-CM | POA: Diagnosis not present

## 2020-03-25 DIAGNOSIS — E1169 Type 2 diabetes mellitus with other specified complication: Secondary | ICD-10-CM | POA: Diagnosis not present

## 2020-03-25 DIAGNOSIS — E785 Hyperlipidemia, unspecified: Secondary | ICD-10-CM | POA: Diagnosis not present

## 2020-03-25 DIAGNOSIS — Z0000 Encounter for general adult medical examination without abnormal findings: Secondary | ICD-10-CM | POA: Diagnosis not present

## 2020-03-25 DIAGNOSIS — E1159 Type 2 diabetes mellitus with other circulatory complications: Secondary | ICD-10-CM | POA: Diagnosis not present

## 2020-03-28 LAB — COMPREHENSIVE METABOLIC PANEL
ALT: 57 IU/L — ABNORMAL HIGH (ref 0–32)
AST: 18 IU/L (ref 0–40)
Albumin/Globulin Ratio: 1.8 (ref 1.2–2.2)
Albumin: 4.5 g/dL (ref 3.7–4.7)
Alkaline Phosphatase: 104 IU/L (ref 44–121)
BUN/Creatinine Ratio: 19 (ref 12–28)
BUN: 15 mg/dL (ref 8–27)
Bilirubin Total: 0.2 mg/dL (ref 0.0–1.2)
CO2: 22 mmol/L (ref 20–29)
Calcium: 9.5 mg/dL (ref 8.7–10.3)
Chloride: 104 mmol/L (ref 96–106)
Creatinine, Ser: 0.81 mg/dL (ref 0.57–1.00)
Globulin, Total: 2.5 g/dL (ref 1.5–4.5)
Glucose: 112 mg/dL — ABNORMAL HIGH (ref 65–99)
Potassium: 4.9 mmol/L (ref 3.5–5.2)
Sodium: 143 mmol/L (ref 134–144)
Total Protein: 7 g/dL (ref 6.0–8.5)
eGFR: 73 mL/min/{1.73_m2} (ref >59)

## 2020-03-28 LAB — TSH+FREE T4
Free T4: 0.97 ng/dL (ref 0.82–1.77)
TSH: 3.06 u[IU]/mL (ref 0.450–4.500)

## 2020-03-28 LAB — LIPID PANEL
Chol/HDL Ratio: 5.6 ratio — ABNORMAL HIGH (ref 0.0–4.4)
Cholesterol, Total: 220 mg/dL — ABNORMAL HIGH (ref 100–199)
HDL: 39 mg/dL — ABNORMAL LOW (ref >39)
LDL Chol Calc (NIH): 134 mg/dL — ABNORMAL HIGH (ref 0–99)
Triglycerides: 259 mg/dL — ABNORMAL HIGH (ref 0–149)
VLDL Cholesterol Cal: 47 mg/dL — ABNORMAL HIGH (ref 5–40)

## 2020-03-28 LAB — HEMOGLOBIN A1C
Est. average glucose Bld gHb Est-mCnc: 126 mg/dL
Hgb A1c MFr Bld: 6 % — ABNORMAL HIGH (ref 4.8–5.6)

## 2020-04-05 DIAGNOSIS — G629 Polyneuropathy, unspecified: Secondary | ICD-10-CM | POA: Diagnosis not present

## 2020-04-05 DIAGNOSIS — I10 Essential (primary) hypertension: Secondary | ICD-10-CM | POA: Diagnosis not present

## 2020-04-05 DIAGNOSIS — J449 Chronic obstructive pulmonary disease, unspecified: Secondary | ICD-10-CM | POA: Diagnosis not present

## 2020-04-05 DIAGNOSIS — D649 Anemia, unspecified: Secondary | ICD-10-CM | POA: Diagnosis not present

## 2020-04-05 DIAGNOSIS — E669 Obesity, unspecified: Secondary | ICD-10-CM | POA: Diagnosis not present

## 2020-04-05 DIAGNOSIS — I739 Peripheral vascular disease, unspecified: Secondary | ICD-10-CM | POA: Diagnosis not present

## 2020-04-05 DIAGNOSIS — E785 Hyperlipidemia, unspecified: Secondary | ICD-10-CM | POA: Diagnosis not present

## 2020-04-05 DIAGNOSIS — F325 Major depressive disorder, single episode, in full remission: Secondary | ICD-10-CM | POA: Diagnosis not present

## 2020-04-05 DIAGNOSIS — F515 Nightmare disorder: Secondary | ICD-10-CM | POA: Diagnosis not present

## 2020-04-21 ENCOUNTER — Other Ambulatory Visit: Payer: Self-pay | Admitting: Family Medicine

## 2020-04-21 DIAGNOSIS — I10 Essential (primary) hypertension: Secondary | ICD-10-CM

## 2020-04-21 DIAGNOSIS — N3281 Overactive bladder: Secondary | ICD-10-CM

## 2020-05-03 ENCOUNTER — Ambulatory Visit
Admission: RE | Admit: 2020-05-03 | Discharge: 2020-05-03 | Disposition: A | Discharge status: Home / Self Care | Payer: Medicare PPO | Source: Ambulatory Visit | Attending: Family Medicine | Admitting: Family Medicine

## 2020-05-03 ENCOUNTER — Other Ambulatory Visit: Payer: Self-pay

## 2020-05-03 DIAGNOSIS — M85832 Other specified disorders of bone density and structure, left forearm: Secondary | ICD-10-CM | POA: Diagnosis not present

## 2020-05-03 DIAGNOSIS — M8589 Other specified disorders of bone density and structure, multiple sites: Secondary | ICD-10-CM | POA: Diagnosis not present

## 2020-05-03 DIAGNOSIS — M85852 Other specified disorders of bone density and structure, left thigh: Secondary | ICD-10-CM | POA: Diagnosis not present

## 2020-05-03 DIAGNOSIS — Z780 Asymptomatic menopausal state: Secondary | ICD-10-CM | POA: Diagnosis not present

## 2020-05-03 DIAGNOSIS — M8580 Other specified disorders of bone density and structure, unspecified site: Secondary | ICD-10-CM

## 2020-05-03 DIAGNOSIS — Z0000 Encounter for general adult medical examination without abnormal findings: Secondary | ICD-10-CM

## 2020-05-03 DIAGNOSIS — Z13820 Encounter for screening for osteoporosis: Secondary | ICD-10-CM | POA: Diagnosis not present

## 2020-05-10 DIAGNOSIS — K2100 Gastro-esophageal reflux disease with esophagitis, without bleeding: Secondary | ICD-10-CM | POA: Diagnosis not present

## 2020-05-10 DIAGNOSIS — M1712 Unilateral primary osteoarthritis, left knee: Secondary | ICD-10-CM | POA: Diagnosis not present

## 2020-05-10 DIAGNOSIS — K31A0 Gastric intestinal metaplasia, unspecified: Secondary | ICD-10-CM | POA: Diagnosis not present

## 2020-05-10 DIAGNOSIS — K2970 Gastritis, unspecified, without bleeding: Secondary | ICD-10-CM | POA: Diagnosis not present

## 2020-05-27 ENCOUNTER — Other Ambulatory Visit: Payer: Self-pay | Admitting: General Surgery

## 2020-05-27 ENCOUNTER — Other Ambulatory Visit: Payer: Self-pay

## 2020-05-27 DIAGNOSIS — E1169 Type 2 diabetes mellitus with other specified complication: Secondary | ICD-10-CM

## 2020-05-27 DIAGNOSIS — C50911 Malignant neoplasm of unspecified site of right female breast: Secondary | ICD-10-CM

## 2020-05-27 NOTE — Progress Notes (Signed)
Patient advised of recommendation for ABI and referral to vascular.

## 2020-06-02 DIAGNOSIS — Z86010 Personal history of colonic polyps: Secondary | ICD-10-CM | POA: Diagnosis not present

## 2020-06-02 DIAGNOSIS — K2100 Gastro-esophageal reflux disease with esophagitis, without bleeding: Secondary | ICD-10-CM | POA: Diagnosis not present

## 2020-06-02 DIAGNOSIS — K31A0 Gastric intestinal metaplasia, unspecified: Secondary | ICD-10-CM | POA: Diagnosis not present

## 2020-06-02 DIAGNOSIS — K2970 Gastritis, unspecified, without bleeding: Secondary | ICD-10-CM | POA: Diagnosis not present

## 2020-06-02 DIAGNOSIS — K591 Functional diarrhea: Secondary | ICD-10-CM | POA: Diagnosis not present

## 2020-06-02 DIAGNOSIS — Z800 Family history of malignant neoplasm of digestive organs: Secondary | ICD-10-CM | POA: Diagnosis not present

## 2020-06-11 ENCOUNTER — Other Ambulatory Visit: Payer: Self-pay | Admitting: Family Medicine

## 2020-06-11 DIAGNOSIS — E785 Hyperlipidemia, unspecified: Secondary | ICD-10-CM

## 2020-06-11 DIAGNOSIS — E1169 Type 2 diabetes mellitus with other specified complication: Secondary | ICD-10-CM

## 2020-06-11 NOTE — Telephone Encounter (Signed)
Requested medication (s) are due for refill today: yes  Requested medication (s) are on the active medication list: yes  Last refill:  03/15/20  Future visit scheduled: yes  Notes to clinic:  overdue lab work   Requested Prescriptions  Pending Prescriptions Disp Refills   rosuvastatin (CRESTOR) 10 MG tablet [Pharmacy Med Name: ROSUVASTATIN CALCIUM 10 MG TAB] 90 tablet 0    Sig: TAKE 1 TABLET BY MOUTH EVERY DAY      Cardiovascular:  Antilipid - Statins Failed - 06/11/2020  9:26 AM      Failed - Total Cholesterol in normal range and within 360 days    Cholesterol, Total  Date Value Ref Range Status  03/25/2020 220 (H) 100 - 199 mg/dL Final          Failed - LDL in normal range and within 360 days    LDL Chol Calc (NIH)  Date Value Ref Range Status  03/25/2020 134 (H) 0 - 99 mg/dL Final          Failed - HDL in normal range and within 360 days    HDL  Date Value Ref Range Status  03/25/2020 39 (L) >39 mg/dL Final          Failed - Triglycerides in normal range and within 360 days    Triglycerides  Date Value Ref Range Status  03/25/2020 259 (H) 0 - 149 mg/dL Final          Passed - Patient is not pregnant      Passed - Valid encounter within last 12 months    Recent Outpatient Visits           2 months ago Encounter for annual physical exam   [facility], Dionne Bucy, MD   8 months ago Essential hypertension   [place], Dionne Bucy, MD   1 year ago Encounter for annual physical exam   [facility] [place], Dionne Bucy, MD   1 year ago Pulmonary emphysema, unspecified emphysema type [facility])   [facility], Dionne Bucy, MD   1 year ago Primary osteoarthritis of left knee   [place], Dionne Bucy, MD       Future Appointments             In 3 months Bacigalupo, Dionne Bucy, MD [facility], [place]   In 9 months Bacigalupo, Dionne Bucy, MD  [facility], [place]

## 2020-06-14 ENCOUNTER — Ambulatory Visit
Admission: RE | Admit: 2020-06-14 | Discharge: 2020-06-14 | Disposition: A | Discharge status: Home / Self Care | Payer: Medicare PPO | Source: Ambulatory Visit | Attending: General Surgery | Admitting: General Surgery

## 2020-06-14 ENCOUNTER — Other Ambulatory Visit: Payer: Self-pay

## 2020-06-14 DIAGNOSIS — Z1231 Encounter for screening mammogram for malignant neoplasm of breast: Secondary | ICD-10-CM | POA: Insufficient documentation

## 2020-06-14 DIAGNOSIS — C50911 Malignant neoplasm of unspecified site of right female breast: Secondary | ICD-10-CM | POA: Insufficient documentation

## 2020-06-20 ENCOUNTER — Other Ambulatory Visit: Payer: Self-pay | Admitting: Family Medicine

## 2020-06-21 DIAGNOSIS — D0511 Intraductal carcinoma in situ of right breast: Secondary | ICD-10-CM | POA: Diagnosis not present

## 2020-06-22 ENCOUNTER — Other Ambulatory Visit (INDEPENDENT_AMBULATORY_CARE_PROVIDER_SITE_OTHER): Payer: Self-pay | Admitting: Vascular Surgery

## 2020-06-22 DIAGNOSIS — R6889 Other general symptoms and signs: Secondary | ICD-10-CM

## 2020-06-23 ENCOUNTER — Encounter: Payer: Self-pay | Admitting: Internal Medicine

## 2020-06-24 ENCOUNTER — Ambulatory Visit (INDEPENDENT_AMBULATORY_CARE_PROVIDER_SITE_OTHER): Payer: Medicare PPO

## 2020-06-24 ENCOUNTER — Ambulatory Visit (INDEPENDENT_AMBULATORY_CARE_PROVIDER_SITE_OTHER): Payer: Medicare PPO | Admitting: Vascular Surgery

## 2020-06-24 ENCOUNTER — Other Ambulatory Visit (INDEPENDENT_AMBULATORY_CARE_PROVIDER_SITE_OTHER): Payer: Self-pay | Admitting: Vascular Surgery

## 2020-06-24 ENCOUNTER — Other Ambulatory Visit: Payer: Self-pay

## 2020-06-24 ENCOUNTER — Encounter (INDEPENDENT_AMBULATORY_CARE_PROVIDER_SITE_OTHER): Payer: Self-pay | Admitting: Vascular Surgery

## 2020-06-24 VITALS — BP 158/92 | HR 77 | Ht 65.0 in | Wt 186.0 lb

## 2020-06-24 DIAGNOSIS — R6889 Other general symptoms and signs: Secondary | ICD-10-CM | POA: Diagnosis not present

## 2020-06-24 DIAGNOSIS — E1159 Type 2 diabetes mellitus with other circulatory complications: Secondary | ICD-10-CM

## 2020-06-24 DIAGNOSIS — E1142 Type 2 diabetes mellitus with diabetic polyneuropathy: Secondary | ICD-10-CM | POA: Diagnosis not present

## 2020-06-24 DIAGNOSIS — I152 Hypertension secondary to endocrine disorders: Secondary | ICD-10-CM

## 2020-06-24 DIAGNOSIS — I739 Peripheral vascular disease, unspecified: Secondary | ICD-10-CM | POA: Diagnosis not present

## 2020-06-24 NOTE — Assessment & Plan Note (Signed)
blood glucose control important in reducing the progression of atherosclerotic disease. Also, involved in wound healing. On appropriate medications.  

## 2020-06-24 NOTE — Progress Notes (Signed)
Patient ID: Natasha Vasquez, female   DOB: 1940-04-11, 80 y.o.   MRN: [id number]  Chief Complaint  Patient presents with   New Patient (Initial Visit)    NP.ABI  and consult ABI following an abnormal PAD screening     HPI Natasha Vasquez is a 80 y.o. female.  I am asked to see the patient by Dr. Brita Romp for evaluation of peripheral arterial disease seen on a home health screening study.  Apparently, her ABI was told that she was in the 0.4 range on the right and 0.8 range on the left.  She has some neuropathy bilaterally, but no claudication symptoms.  No rest pain.  No ulceration.  Her ABIs today are normal at 1.3 bilaterally with triphasic waveforms and good digital pressures consistent with no significant arterial insufficiency..     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  ER/PR NEGATIVE   Cataract    Chronic cough    Complication of anesthesia    20 years ago pts b/p dropped really low, "coded"   Depression    Depression    Diabetes mellitus without complication (HCC)    diet controlled   Family history of adverse reaction to anesthesia    son PONV   GERD (gastroesophageal reflux disease)    Hypercholesteremia    Hyperlipidemia    Hypertension    Melanoma ([place])    MVP (mitral valve prolapse)    followed by PCP   Nephritis    Nephritis    S/P appendectomy    Thoracic aortic aneurysm ([place])    Torn meniscus    left    Past Surgical History:  Procedure Laterality Date   ABDOMINAL HYSTERECTOMY     APPENDECTOMY     BREAST BIOPSY Right 04/25/2017   retroareolar 10:00   wing clip   path pending   BREAST BIOPSY Right 04/25/2017   10:00  5CMFN  venus clip    path pending   BREAST BIOPSY Right 05/03/2017   Affirm Bx- path pending   BREAST CYST ASPIRATION Bilateral    NEG   BREAST EXCISIONAL BIOPSY Left  20+ yrs ago   NEG   BREAST RECONSTRUCTION WITH PLACEMENT OF TISSUE EXPANDER AND FLEX HD (ACELLULAR HYDRATED DERMIS) Right 06/03/2017   Procedure: BREAST RECONSTRUCTION WITH PLACEMENT OF TISSUE EXPANDER AND FLEX HD (ACELLULAR HYDRATED DERMIS);  Surgeon: Wallace Going, DO;  Location: [facility];  Service: Plastics;  Laterality: Right;   BREAST REDUCTION SURGERY Left 11/06/2017   Procedure: BREAST REDUCTION;  Surgeon: Wallace Going, DO;  Location: [facility];  Service: Plastics;  Laterality: Left;   BROW LIFT Bilateral 02/01/2015   Procedure: BLEPHAROPLASTY bilateral upper eyelids.;  Surgeon: Karle Starch, MD;  Location: [place];  Service: Ophthalmology;  Laterality: Bilateral;  DIABETIC - diet controlled   CATARACT EXTRACTION W/PHACO Left 01/14/2019   Procedure: CATARACT EXTRACTION PHACO AND INTRAOCULAR LENS PLACEMENT (IOC) LEFT TECNIS TORIC ADD 5.62 00:46.5 30.0%;  Surgeon: Leandrew Koyanagi, MD;  Location: [place];  Service: Ophthalmology;  Laterality: Left;  diabetic - diet controlled   CATARACT EXTRACTION W/PHACO Right 02/11/2019   Procedure: CATARACT EXTRACTION PHACO AND INTRAOCULAR LENS PLACEMENT (IOC) RIGHT DIABETIC 4.82 00:46.9 10.3%;  Surgeon: Leandrew Koyanagi, MD;  Location: [place];  Service: Ophthalmology;  Laterality: Right;  Diabetic - diet controlled   CHOLECYSTECTOMY     COLONOSCOPY     COLONOSCOPY WITH PROPOFOL N/A 05/11/2015   Procedure: COLONOSCOPY WITH PROPOFOL;  Surgeon: Manya Silvas, MD;  Location: [facility];  Service: Endoscopy;  Laterality: N/A;   ESOPHAGOGASTRODUODENOSCOPY     ESOPHAGOGASTRODUODENOSCOPY (EGD) WITH PROPOFOL  05/11/2015   Procedure: ESOPHAGOGASTRODUODENOSCOPY (EGD) WITH PROPOFOL;  Surgeon: Manya Silvas, MD;  Location: [facility];  Service: Endoscopy;;   FRACTURE SURGERY Right    arm and shoulder   JOINT REPLACEMENT     KNEE ARTHROSCOPY Left 03/14/2016   Procedure: ARTHROSCOPY KNEE, PARTIAL  MEDIAL MENISECTOMY, CHONDROPLASTY;  Surgeon: Dereck Leep, MD;  Location: [facility];  Service: Orthopedics;  Laterality: Left;   MASTECTOMY Right 2019   MASTECTOMY W/ SENTINEL NODE BIOPSY Right 06/03/2017   Procedure: MASTECTOMY WITH SENTINEL LYMPH NODE BIOPSY;  Surgeon: Robert Bellow, MD;  Location: [facility];  Service: General;  Laterality: Right;   PARTIAL HIP ARTHROPLASTY Right    REDUCTION MAMMAPLASTY Left 2019   REMOVAL OF BILATERAL TISSUE EXPANDERS WITH PLACEMENT OF BILATERAL BREAST IMPLANTS Right 11/06/2017   Procedure: REMOVAL OF TISSUE EXPANDER WITH PLACEMENT OF BREAST IMPLANT;  Surgeon: Wallace Going, DO;  Location: [facility];  Service: Plastics;  Laterality: Right;   TONSILLECTOMY       Family History  Problem Relation Age of Onset   Cancer Mother        colon/rectal   Colon cancer Mother    Heart disease Father    Hypertension Father    Atrial fibrillation Father    Diabetes Father    Cancer Brother        lung   Healthy Son    Healthy Son    Breast cancer Neg Hx       Social History   Tobacco Use   Smoking status: Former    Pack years: 0.00    Types: Cigarettes    Quit date: 1965    Years since quitting: 57.4   Smokeless tobacco: Never   Tobacco comments:    smoked in college  Vaping Use   Vaping Use: Never used  Substance Use Topics   Alcohol use: Yes    Alcohol/week: 7.0 - 14.0 standard drinks    Types: 7 - 14 Glasses of wine per week    Comment: 1-2 glasses of wine per night   Drug use: No     Allergies  Allergen Reactions   Codeine Other (See Comments)    Chest pain   Lorazepam Other (See Comments)    "Feels loopy"   Oxycodone Other (See Comments)    MENTAL CHANGES   Paregoric     Chest pain---tolerated MORPHINE   Penicillins Hives    Has patient had a PCN reaction causing immediate rash, facial/tongue/throat swelling, SOB or lightheadedness with hypotension: Yes Has patient had a PCN reaction causing severe rash involving  mucus membranes or skin necrosis: No Has patient had a PCN reaction that required hospitalization: NO Has patient had a PCN reaction occurring within the last 10 years: No If all of the above answers are "NO", then may proceed with Cephalosporin use.  Can take Augmentin   Quinolones     PATIENT UNAWARE OF allergy to this class of antibiotics, only aware of allergy to [facility] Patient was warned about not using Cipro and similar antibiotics. Recent studies have raised concern that fluoroquinolone antibiotics could be associated with an increased  risk of aortic aneurysm Fluoroquinolones have non-antimicrobial properties that might jeopardise the integrity of the extracellular matrix of the vascular wall In a  propensity score matched cohort study in [place], there was a 66% increased rate   Aleve [Naproxen Sodium] Hives, Rash and Other (See Comments)    headaches    Current Outpatient Medications  Medication Sig Dispense Refill   acetaminophen (TYLENOL) 500 MG tablet Take 500 mg by mouth every 6 (six) hours as needed.     albuterol (PROVENTIL HFA;VENTOLIN HFA) 108 (90 Base) MCG/ACT inhaler Inhale 2 puffs into the lungs every 6 (six) hours as needed for wheezing or shortness of breath. 1 Inhaler 2   ARIPiprazole (ABILIFY) 2 MG tablet TAKE 1 TABLET (2 MG TOTAL) BY MOUTH EVERY EVENING. 90 tablet 1   aspirin 81 MG tablet Take 81 mg by mouth daily.     atorvastatin (LIPITOR) 40 MG tablet TAKE 1 TABLET BY MOUTH EVERY DAY 90 tablet 2   bismuth subsalicylate (PEPTO BISMOL) 262 MG chewable tablet Chew 262 mg by mouth as needed. **capsule**     Bismuth Subsalicylate 409 MG TABS Take 1 tablet by mouth as needed.     calcium carbonate (TUMS - DOSED IN MG ELEMENTAL CALCIUM) 500 MG chewable tablet Chew 1 tablet by mouth as needed for indigestion or heartburn.     clindamycin (CLEOCIN) 150 MG capsule Take 4 capsules by mouth as directed. 1 hour prior to dental procedure     clobetasol ointment (TEMOVATE) 0.05 %  APPLY TO AFFECTED AREAS AT NIGHT AS NEEDED     clotrimazole (LOTRIMIN) 1 % cream Apply 1 application topically 2 (two) times daily. 30 g 1   ferrous sulfate 324 MG TBEC Take 324 mg by mouth daily with breakfast.     fluocinonide (LIDEX) 0.05 % external solution APPLY TO AFFECTED AREAS OF SCALP DAILY UNTIL CLEAR, THEN FOR FLARES     lisinopril (ZESTRIL) 5 MG tablet TAKE 1 TABLET BY MOUTH EVERY DAY 90 tablet 1   Multiple Vitamins-Minerals (CENTRUM SILVER PO) Take 1 tablet by mouth daily.     omeprazole (PRILOSEC) 40 MG capsule Take 1 capsule (40 mg total) by mouth 2 (two) times daily. 180 capsule 1   oxybutynin (DITROPAN-XL) 10 MG 24 hr tablet TAKE 1 TABLET BY MOUTH EVERY DAY 90 tablet 1   rosuvastatin (CRESTOR) 10 MG tablet TAKE 1 TABLET BY MOUTH EVERY DAY 90 tablet 0   Venlafaxine HCl 225 MG TB24 TAKE 1 TABLET BY MOUTH EVERY DAY 90 tablet 0   No current facility-administered medications for this visit.      REVIEW OF SYSTEMS (Negative unless checked)  Constitutional: [] Weight loss  [] Fever  [] Chills Cardiac: [] Chest pain   [] Chest pressure   [] Palpitations   [] Shortness of breath when laying flat   [] Shortness of breath at rest   [] Shortness of breath with exertion. Vascular:  [] Pain in legs with walking   [] Pain in legs at rest   [] Pain in legs when laying flat   [] Claudication   [] Pain in feet when walking  [] Pain in feet at rest  [] Pain in feet when laying flat   [] History of DVT   [] Phlebitis   [] Swelling in legs   [] Varicose veins   [] Non-healing ulcers Pulmonary:   [] Uses home oxygen   [] Productive cough   [] Hemoptysis   [] Wheeze  [] COPD   [] Asthma Neurologic:  [] Dizziness  [] Blackouts   [] Seizures   [] History of stroke   [] History of TIA  []   Aphasia   [] Temporary blindness   [] Dysphagia   [] Weakness or numbness in arms   [x] Weakness or numbness in legs Musculoskeletal:  [x] Arthritis   [] Joint swelling   [x] Joint pain   [] Low back pain Hematologic:  [] Easy bruising  [] Easy bleeding    [] Hypercoagulable state   [] Anemic  [] Hepatitis Gastrointestinal:  [] Blood in stool   [] Vomiting blood  [x] Gastroesophageal reflux/heartburn   [] Abdominal pain Genitourinary:  [] Chronic kidney disease   [] Difficult urination  [] Frequent urination  [] Burning with urination   [] Hematuria Skin:  [] Rashes   [] Ulcers   [] Wounds Psychological:  [] History of anxiety   []  History of major depression.    Physical Exam BP (!) 158/92   Pulse 77   Ht 5\' 5"  (1.651 m)   Wt 186 lb (84.4 kg)   BMI 30.95 kg/m  Gen:  WD/WN, NAD Head: [place]/AT, No temporalis wasting.  Ear/Nose/Throat: Hearing grossly intact, nares w/o erythema or drainage, oropharynx w/o Erythema/Exudate Eyes: Conjunctiva clear, sclera non-icteric  Neck: trachea midline.  No JVD.  Pulmonary:  Good air movement, respirations not labored, no use of accessory muscles  Cardiac: RRR, no JVD Vascular:  Vessel Right Left  Radial Palpable Palpable                          DP Palpable Palpable  PT Palpable Palpable   Gastrointestinal:. No masses, surgical incisions, or scars. Musculoskeletal: M/S 5/5 throughout.  Extremities without ischemic changes.  No deformity or atrophy.  No edema. Neurologic: Sensation grossly intact in extremities.  Symmetrical.  Speech is fluent. Motor exam as listed above. Psychiatric: Judgment intact, Mood & affect appropriate for pt's clinical situation. Dermatologic: No rashes or ulcers noted.  No cellulitis or open wounds.    Radiology MM 3D SCREEN BREAST UNI LEFT  Result Date: 06/15/2020 CLINICAL DATA:  Screening. EXAM: DIGITAL SCREENING UNILATERAL LEFT MAMMOGRAM WITH CAD AND TOMOSYNTHESIS TECHNIQUE: Left screening digital craniocaudal and mediolateral oblique mammograms were obtained. Left screening digital breast tomosynthesis was performed. The images were evaluated with computer-aided detection. COMPARISON:  Previous exam(s). ACR Breast Density Category b: There are scattered areas of fibroglandular  density. FINDINGS: There are no findings suspicious for malignancy. Status post LEFT breast reduction and RIGHT mastectomy. The images were evaluated with computer-aided detection. IMPRESSION: No mammographic evidence of malignancy. A result letter of this screening mammogram will be mailed directly to the patient. RECOMMENDATION: Screening mammogram in one year. (Code:SM-B-01Y) BI-RADS CATEGORY  2: Benign. Electronically Signed   By: Valentino Saxon MD   On: 06/15/2020 09:21    Labs No results found for this or any previous visit (from the past 2160 hour(s)).  Assessment/Plan:  PAD (peripheral artery disease) ([place]) The patient had a home health screening suggesting significant peripheral arterial disease bilaterally. Her ABIs today are normal at 1.3 bilaterally with triphasic waveforms and good digital pressures consistent with no significant arterial insufficiency.  As we have seen many times in the past, this home health screening was very inaccurate and clearly does not represent her normal vascular status.  No further vascular work-up or planning is necessary.  Return to clinic as needed.  Hypertension associated with diabetes ([place]) blood pressure control important in reducing the progression of atherosclerotic disease. On appropriate oral medications.   Type 2 diabetes mellitus (HCC) blood glucose control important in reducing the progression of atherosclerotic disease. Also, involved in wound healing. On appropriate medications.      Leotis Pain 06/24/2020, 9:59 AM  This note was created with Dragon medical transcription system.  Any errors from dictation are unintentional.

## 2020-06-24 NOTE — Assessment & Plan Note (Signed)
blood pressure control important in reducing the progression of atherosclerotic disease. On appropriate oral medications.  

## 2020-06-24 NOTE — Assessment & Plan Note (Signed)
The patient had a home health screening suggesting significant peripheral arterial disease bilaterally. Her ABIs today are normal at 1.3 bilaterally with triphasic waveforms and good digital pressures consistent with no significant arterial insufficiency.  As we have seen many times in the past, this home health screening was very inaccurate and clearly does not represent her normal vascular status.  No further vascular work-up or planning is necessary.  Return to clinic as needed.

## 2020-06-27 ENCOUNTER — Other Ambulatory Visit: Payer: Self-pay | Admitting: *Deleted

## 2020-06-27 DIAGNOSIS — I712 Thoracic aortic aneurysm, without rupture, unspecified: Secondary | ICD-10-CM

## 2020-08-10 ENCOUNTER — Encounter: Payer: Self-pay | Admitting: Internal Medicine

## 2020-08-16 ENCOUNTER — Ambulatory Visit: Payer: Medicare PPO | Admitting: Thoracic Surgery (Cardiothoracic Vascular Surgery)

## 2020-08-16 ENCOUNTER — Encounter: Payer: Self-pay | Admitting: Thoracic Surgery (Cardiothoracic Vascular Surgery)

## 2020-08-16 ENCOUNTER — Ambulatory Visit
Admission: RE | Admit: 2020-08-16 | Discharge: 2020-08-16 | Disposition: A | Discharge status: Home / Self Care | Payer: Medicare PPO | Source: Ambulatory Visit | Attending: Thoracic Surgery (Cardiothoracic Vascular Surgery) | Admitting: Thoracic Surgery (Cardiothoracic Vascular Surgery)

## 2020-08-16 ENCOUNTER — Other Ambulatory Visit: Payer: Self-pay

## 2020-08-16 VITALS — BP 150/81 | HR 96 | Resp 20 | Ht 65.0 in | Wt 190.6 lb

## 2020-08-16 DIAGNOSIS — I712 Thoracic aortic aneurysm, without rupture, unspecified: Secondary | ICD-10-CM

## 2020-08-16 DIAGNOSIS — I700 Atherosclerosis of aorta: Secondary | ICD-10-CM | POA: Diagnosis not present

## 2020-08-16 DIAGNOSIS — J984 Other disorders of lung: Secondary | ICD-10-CM | POA: Diagnosis not present

## 2020-08-16 DIAGNOSIS — I2510 Atherosclerotic heart disease of native coronary artery without angina pectoris: Secondary | ICD-10-CM | POA: Diagnosis not present

## 2020-08-16 MED ORDER — IOPAMIDOL (ISOVUE-370) INJECTION 76%
75.0000 mL | Freq: Once | INTRAVENOUS | Status: AC | PRN
  Administered 2020-08-16: 75 mL via INTRAVENOUS

## 2020-08-16 NOTE — Progress Notes (Signed)
[address]       [place] [address]             [phone]       HPI: Mrs. Longaker returns for follow-up of her ascending aneurysm.  Talia Debs is an 80 year old woman with a past medical history significant for ascending thoracic aortic aneurysm, hypertension, hyperlipidemia, mitral valve prolapse, PAD, breast cancer, melanoma, reflux, Barrett's esophagus, nephritis, arthritis, diet-controlled type 2 diabetes, and depression.  She was originally found to have an ascending aneurysm in 2019.  She was followed by Dr. Servando Snare prior to his retirement.  She now returns for a scheduled annual CT.  She has not been having any chest pain, pressure, or tightness.  She has chronic dyspnea with exertion which is not changed.  She does not monitor her blood pressure at home.  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  ER/PR NEGATIVE   Cataract    Chronic cough    Complication of anesthesia    20 years ago pts b/p dropped really low, "coded"   Depression    Depression    Diabetes mellitus without complication (HCC)    diet controlled   Family history of adverse reaction to anesthesia    son PONV   GERD (gastroesophageal reflux disease)    Hypercholesteremia    Hyperlipidemia    Hypertension    Melanoma ([place])    MVP (mitral valve prolapse)    followed by PCP   Nephritis    Nephritis    S/P appendectomy    Thoracic aortic aneurysm (HCC)    Torn meniscus    left    Current Outpatient Medications  Medication Sig Dispense Refill   acetaminophen (TYLENOL) 500 MG tablet Take 500 mg by mouth every 6 (six) hours as needed.     albuterol (PROVENTIL HFA;VENTOLIN HFA) 108 (90 Base) MCG/ACT inhaler Inhale 2 puffs into the lungs every 6 (six) hours as needed for wheezing or shortness of breath. 1 Inhaler 2    ARIPiprazole (ABILIFY) 2 MG tablet TAKE 1 TABLET (2 MG TOTAL) BY MOUTH EVERY EVENING. 90 tablet 1   aspirin 81 MG tablet Take 81 mg by mouth daily.     atorvastatin (LIPITOR) 40 MG tablet TAKE 1 TABLET BY MOUTH EVERY DAY 90 tablet 2   bismuth subsalicylate (PEPTO BISMOL) 262 MG chewable tablet Chew 262 mg by mouth as needed. **capsule**     Bismuth Subsalicylate 99991111 MG TABS Take 1 tablet by mouth as needed.     calcium carbonate (TUMS - DOSED IN MG ELEMENTAL CALCIUM) 500 MG chewable tablet Chew 1 tablet by mouth as needed for indigestion or heartburn.     clindamycin (CLEOCIN) 150 MG capsule Take 4 capsules by mouth as directed. 1 hour prior to dental procedure     clobetasol ointment (TEMOVATE) 0.05 % APPLY TO AFFECTED AREAS AT NIGHT AS NEEDED     clotrimazole (LOTRIMIN) 1 % cream Apply 1 application topically 2 (two) times daily. 30 g 1   ferrous sulfate 324 MG TBEC Take 324 mg by mouth daily with breakfast.     fluocinonide (LIDEX) 0.05 % external solution APPLY TO AFFECTED AREAS OF SCALP DAILY UNTIL CLEAR, THEN FOR FLARES     lisinopril (ZESTRIL) 5  MG tablet TAKE 1 TABLET BY MOUTH EVERY DAY 90 tablet 1   Multiple Vitamins-Minerals (CENTRUM SILVER PO) Take 1 tablet by mouth daily.     omeprazole (PRILOSEC) 40 MG capsule Take 1 capsule (40 mg total) by mouth 2 (two) times daily. 180 capsule 1   oxybutynin (DITROPAN-XL) 10 MG 24 hr tablet TAKE 1 TABLET BY MOUTH EVERY DAY 90 tablet 1   rosuvastatin (CRESTOR) 10 MG tablet TAKE 1 TABLET BY MOUTH EVERY DAY 90 tablet 0   Venlafaxine HCl 225 MG TB24 TAKE 1 TABLET BY MOUTH EVERY DAY 90 tablet 0   No current facility-administered medications for this visit.    Physical Exam BP (!) 150/81 (BP Location: Left Arm, Patient Position: Sitting, Cuff Size: Large)   Pulse 96   Resp 20   Ht '5\' 5"'$  (1.651 m)   Wt 190 lb 9.6 oz (86.5 kg)   SpO2 96% Comment: RA  BMI 31.70 kg/m  80 year old woman in no acute distress Alert and oriented x3, grossly no No  carotid bruits Lungs clear bilaterally Cardiac regular rate and rhythm with normal S1 and S2 with no rubs or murmurs Trace edema both ankles   Diagnostic Tests: CT ANGIOGRAPHY CHEST WITH CONTRAST   TECHNIQUE: Multidetector CT imaging of the chest was performed using the standard protocol during bolus administration of intravenous contrast. Multiplanar CT image reconstructions and MIPs were obtained to evaluate the vascular anatomy.   CONTRAST:  63m ISOVUE-370 IOPAMIDOL (ISOVUE-370) INJECTION 76%   COMPARISON:  08/13/2019   FINDINGS: Cardiovascular: Mild atherosclerotic calcification of the aortic arch and branch vessels.   The ascending thoracic aorta measures up to 4.6 cm in diameter on image 71 of series 5, previously 4.4 cm on 08/13/2019. Proximal ascending aorta 4.0 cm in diameter, proximal arch 3.4 cm in diameter, distal arch 2.9 cm in diameter, mid descending thoracic aorta 2.3 cm in diameter, and thoracic aorta at the hiatus 2.2 cm in diameter.   Today's exam was not timed for pulmonary arterial opacification, but there is no large or central clot in the pulmonary arteries. Upper normal heart size.   Mediastinum/Nodes: Borderline prominent right hilar lymph node at 1.0 cm in diameter, previously the same.   Lungs/Pleura: Mild biapical pleuroparenchymal scarring.   Several adjacent ill-defined ground-glass opacities are present in the right upper lobe inferiorly on images 60 through 64 of series 7, measuring up to about 0.8 cm in diameter. This likely represents localized alveolitis but may warrant observation.   Branching density in the right middle lobe on image 101 series 7 is stable and compatible with a small mucocele.   3 mm subpleural nodule in the left upper lobe on image 66 series 7, unchanged.   Upper Abdomen: Stable prominence of the common bile duct at about 2.2 cm in diameter, possibly a physiologic response to prior cholecystectomy.    Musculoskeletal: Right breast implant.   Review of the MIP images confirms the above findings.   IMPRESSION: 1. Ascending thoracic aortic aneurysm, 4.6 cm in diameter (previously 4.4 cm last year). Recommend semi-annual imaging followup by CTA or MRA and referral to cardiothoracic surgery if not already obtained. This recommendation follows 2010 ACCF/AHA/AATS/ACR/ASA/SCA/SCAI/SIR/STS/SVM Guidelines for the Diagnosis and Management of Patients With Thoracic Aortic Disease. Circulation. 2010; 121:[id number] Aortic aneurysm NOS (ICD10-I71.9) 2. Faint ground-glass opacity inferiorly in the right upper lobe is new and probably represents localized alveolitis, but attention to this region on follow up exams is suggested. 3. Small mucocele in the right  middle lobe, stable. 4. Stable borderline prominent right hilar lymph node at 1.0 cm in diameter.     Electronically Signed   By: Van Clines M.D.   On: 08/16/2020 12:22 I personally reviewed the CT images.  There is a 4.6 cm ascending aneurysm and thoracic aortic atherosclerosis.  Groundglass opacity inferior aspect of right upper lobe.  Impression: Harlequinn Kovalski is an 80 year old woman with a past medical history significant for ascending thoracic aortic aneurysm, hypertension, hyperlipidemia, mitral valve prolapse, PAD, breast cancer, melanoma, reflux, Barrett's esophagus, nephritis, arthritis, diet-controlled type 2 diabetes, and depression.   Ascending aneurysm-her aneurysm today measures 4.6 cm.  That is an increase of 1 to 2 mm from her scan a year ago where it measured 4.4 to 4.5 cm.  No indication for surgery at this time but needs follow-up in 27month once the aneurysm exceeds 4.5 cm.  Hypertension-she is compliant with her medications.  She does not check her blood pressure at home.  Her blood pressure was very elevated on first check but was improved but still elevated on follow-up.  I advised her to get a blood pressure  cuff and check her self on a regular basis at home.  She knows the goal is systolic less than [id number] ideally less than 130.  Hyperlipidemia.  She is on Lipitor.  Plan: Check blood pressure at home.  Notify if greater than [id number] Return in 6 months with CT angio of chest  I spent over 20 minutes in review of records, images, and in consultation with Mrs. Bunning today. SMelrose Nakayama MD [facility] ([phone]

## 2020-09-01 ENCOUNTER — Other Ambulatory Visit: Payer: Self-pay | Admitting: *Deleted

## 2020-09-01 DIAGNOSIS — D500 Iron deficiency anemia secondary to blood loss (chronic): Secondary | ICD-10-CM

## 2020-09-02 ENCOUNTER — Inpatient Hospital Stay: Payer: Medicare PPO | Admitting: Internal Medicine

## 2020-09-02 ENCOUNTER — Inpatient Hospital Stay: Payer: Medicare PPO

## 2020-09-02 ENCOUNTER — Inpatient Hospital Stay: Payer: Medicare PPO | Attending: Internal Medicine

## 2020-09-02 DIAGNOSIS — I10 Essential (primary) hypertension: Secondary | ICD-10-CM | POA: Diagnosis not present

## 2020-09-02 DIAGNOSIS — E7800 Pure hypercholesterolemia, unspecified: Secondary | ICD-10-CM | POA: Diagnosis not present

## 2020-09-02 DIAGNOSIS — D509 Iron deficiency anemia, unspecified: Secondary | ICD-10-CM | POA: Insufficient documentation

## 2020-09-02 DIAGNOSIS — E611 Iron deficiency: Secondary | ICD-10-CM

## 2020-09-02 DIAGNOSIS — E119 Type 2 diabetes mellitus without complications: Secondary | ICD-10-CM | POA: Insufficient documentation

## 2020-09-02 DIAGNOSIS — Z87891 Personal history of nicotine dependence: Secondary | ICD-10-CM | POA: Diagnosis not present

## 2020-09-02 DIAGNOSIS — Z7982 Long term (current) use of aspirin: Secondary | ICD-10-CM | POA: Diagnosis not present

## 2020-09-02 DIAGNOSIS — K529 Noninfective gastroenteritis and colitis, unspecified: Secondary | ICD-10-CM | POA: Diagnosis not present

## 2020-09-02 DIAGNOSIS — D500 Iron deficiency anemia secondary to blood loss (chronic): Secondary | ICD-10-CM

## 2020-09-02 DIAGNOSIS — Z79899 Other long term (current) drug therapy: Secondary | ICD-10-CM | POA: Insufficient documentation

## 2020-09-02 LAB — CBC WITH DIFFERENTIAL/PLATELET
Abs Immature Granulocytes: 0.08 10*3/uL — ABNORMAL HIGH (ref 0.00–0.07)
Basophils Absolute: 0.1 10*3/uL (ref 0.0–0.1)
Basophils Relative: 1 %
Eosinophils Absolute: 0.3 10*3/uL (ref 0.0–0.5)
Eosinophils Relative: 4 %
HCT: 40.5 % (ref 36.0–46.0)
Hemoglobin: 12.6 g/dL (ref 12.0–15.0)
Immature Granulocytes: 1 %
Lymphocytes Relative: 23 %
Lymphs Abs: 1.9 10*3/uL (ref 0.7–4.0)
MCH: 27 pg (ref 26.0–34.0)
MCHC: 31.1 g/dL (ref 30.0–36.0)
MCV: 86.7 fL (ref 80.0–100.0)
Monocytes Absolute: 0.5 10*3/uL (ref 0.1–1.0)
Monocytes Relative: 6 %
Neutro Abs: 5.6 10*3/uL (ref 1.7–7.7)
Neutrophils Relative %: 65 %
Platelets: 336 10*3/uL (ref 150–400)
RBC: 4.67 MIL/uL (ref 3.87–5.11)
RDW: 15.9 % — ABNORMAL HIGH (ref 11.5–15.5)
WBC: 8.6 10*3/uL (ref 4.0–10.5)
nRBC: 0 % (ref 0.0–0.2)

## 2020-09-02 LAB — IRON AND TIBC
Iron: 70 ug/dL (ref 28–170)
Saturation Ratios: 20 % (ref 10.4–31.8)
TIBC: 354 ug/dL (ref 250–450)
UIBC: 284 ug/dL

## 2020-09-02 LAB — FERRITIN: Ferritin: 73 ng/mL (ref 11–307)

## 2020-09-02 NOTE — Progress Notes (Signed)
[place] NOTE  Patient Care Team: Brita Romp Dionne Bucy, MD as PCP - General (Family Medicine) Watt Climes, PA as Physician Assistant (Physician Assistant) Marry Guan, Laurice Record, MD as Consulting Physician (Orthopedic Surgery) Bary Castilla, Forest Gleason, MD (General Surgery) Dasher, Rayvon Char, MD (Dermatology) Grace Isaac, MD (Inactive) as Consulting Physician (Cardiothoracic Surgery) Cammie Sickle, MD as Consulting Physician (Internal Medicine) Leandrew Koyanagi, MD as Referring Physician (Ophthalmology)  CHIEF COMPLAINTS/PURPOSE OF CONSULTATION: Anemia  HEMATOLOGY HISTORY  # ANEMIA EGD/ colonoscopy- 2017 [Dr.Elliot]-August 2020-hemoglobin 10.4 MCV 78; iron saturation 10% ferritin 44; zinc copper/LDH heptoglobin-Normal. CT-C/A-P-NEG.   # Thoracic Aneurysm [4.4cm]- surveillaince  HISTORY OF PRESENTING ILLNESS:  Natasha Vasquez 80 y.o.  female with iron deficient anemia-unclear etiology is here for follow-up.  Patient continues to have chronic diarrhea.  1-2 loose stools a day.  She is awaiting colonoscopy with Dr. Alice Reichert next week.  She continues to be on oral iron.  Denies any blood in stool.  Review of Systems  Constitutional:  Negative for chills, diaphoresis, fever and weight loss.  HENT:  Negative for nosebleeds and sore throat.   Eyes:  Negative for double vision.  Respiratory:  Negative for cough, hemoptysis, sputum production, shortness of breath and wheezing.   Cardiovascular:  Negative for chest pain, palpitations, orthopnea and leg swelling.  Gastrointestinal:  Positive for diarrhea. Negative for abdominal pain, blood in stool, constipation, heartburn, melena, nausea and vomiting.  Genitourinary:  Negative for dysuria, frequency and urgency.  Musculoskeletal:  Positive for back pain and joint pain.  Skin: Negative.  Negative for itching and rash.  Neurological:  Negative for dizziness, tingling, focal weakness, weakness and headaches.   Endo/Heme/Allergies:  Does not bruise/bleed easily.  Psychiatric/Behavioral:  Negative for depression. The patient is not nervous/anxious and does not have insomnia.    MEDICAL HISTORY:  Past Medical History:  Diagnosis Date  . Anemia in pregnancy   . Arthritis    hands  . Barrett esophagus   . Bell palsy 04/24/2004  . 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  ER/PR NEGATIVE  . Cataract   . Chronic cough   . Complication of anesthesia    20 years ago pts b/p dropped really low, "coded"  . Depression   . Depression   . Diabetes mellitus without complication (HCC)    diet controlled  . Family history of adverse reaction to anesthesia    son PONV  . GERD (gastroesophageal reflux disease)   . Hypercholesteremia   . Hyperlipidemia   . Hypertension   . Melanoma ([place])   . MVP (mitral valve prolapse)    followed by PCP  . Nephritis   . Nephritis   . S/P appendectomy   . Thoracic aortic aneurysm ([place])   . Torn meniscus    left    SURGICAL HISTORY: Past Surgical History:  Procedure Laterality Date  . ABDOMINAL HYSTERECTOMY    . APPENDECTOMY    . BREAST BIOPSY Right 04/25/2017   retroareolar 10:00   wing clip   path pending  . BREAST BIOPSY Right 04/25/2017   10:00  5CMFN  venus clip    path pending  . BREAST BIOPSY Right 05/03/2017   Affirm Bx- path pending  . BREAST CYST ASPIRATION Bilateral    NEG  . BREAST EXCISIONAL BIOPSY Left 20+ yrs ago   NEG  . BREAST RECONSTRUCTION WITH PLACEMENT OF TISSUE EXPANDER AND FLEX HD (ACELLULAR HYDRATED DERMIS) Right 06/03/2017  Procedure: BREAST RECONSTRUCTION WITH PLACEMENT OF TISSUE EXPANDER AND FLEX HD (ACELLULAR HYDRATED DERMIS);  Surgeon: Wallace Going, DO;  Location: [facility];  Service: Plastics;  Laterality: Right;  . BREAST REDUCTION SURGERY Left 11/06/2017   Procedure: BREAST REDUCTION;  Surgeon: Wallace Going, DO;  Location: [facility];  Service: Plastics;  Laterality: Left;  . BROW LIFT  Bilateral 02/01/2015   Procedure: BLEPHAROPLASTY bilateral upper eyelids.;  Surgeon: Karle Starch, MD;  Location: [place];  Service: Ophthalmology;  Laterality: Bilateral;  DIABETIC - diet controlled  . CATARACT EXTRACTION W/PHACO Left 01/14/2019   Procedure: CATARACT EXTRACTION PHACO AND INTRAOCULAR LENS PLACEMENT (IOC) LEFT TECNIS TORIC ADD 5.62 00:46.5 30.0%;  Surgeon: Leandrew Koyanagi, MD;  Location: [place];  Service: Ophthalmology;  Laterality: Left;  diabetic - diet controlled  . CATARACT EXTRACTION W/PHACO Right 02/11/2019   Procedure: CATARACT EXTRACTION PHACO AND INTRAOCULAR LENS PLACEMENT (IOC) RIGHT DIABETIC 4.82 00:46.9 10.3%;  Surgeon: Leandrew Koyanagi, MD;  Location: [place];  Service: Ophthalmology;  Laterality: Right;  Diabetic - diet controlled  . CHOLECYSTECTOMY    . COLONOSCOPY    . COLONOSCOPY WITH PROPOFOL N/A 05/11/2015   Procedure: COLONOSCOPY WITH PROPOFOL;  Surgeon: Manya Silvas, MD;  Location: [facility];  Service: Endoscopy;  Laterality: N/A;  . ESOPHAGOGASTRODUODENOSCOPY    . ESOPHAGOGASTRODUODENOSCOPY (EGD) WITH PROPOFOL  05/11/2015   Procedure: ESOPHAGOGASTRODUODENOSCOPY (EGD) WITH PROPOFOL;  Surgeon: Manya Silvas, MD;  Location: [facility];  Service: Endoscopy;;  . FRACTURE SURGERY Right    arm and shoulder  . JOINT REPLACEMENT    . KNEE ARTHROSCOPY Left 03/14/2016   Procedure: ARTHROSCOPY KNEE, PARTIAL MEDIAL MENISECTOMY, CHONDROPLASTY;  Surgeon: Dereck Leep, MD;  Location: [facility];  Service: Orthopedics;  Laterality: Left;  Marland Kitchen MASTECTOMY Right 2019  . MASTECTOMY W/ SENTINEL NODE BIOPSY Right 06/03/2017   Procedure: MASTECTOMY WITH SENTINEL LYMPH NODE BIOPSY;  Surgeon: Robert Bellow, MD;  Location: [facility];  Service: General;  Laterality: Right;  . PARTIAL HIP ARTHROPLASTY Right   . REDUCTION MAMMAPLASTY Left 2019  . REMOVAL OF BILATERAL TISSUE EXPANDERS WITH PLACEMENT OF BILATERAL BREAST IMPLANTS  Right 11/06/2017   Procedure: REMOVAL OF TISSUE EXPANDER WITH PLACEMENT OF BREAST IMPLANT;  Surgeon: Wallace Going, DO;  Location: [facility];  Service: Plastics;  Laterality: Right;  . TONSILLECTOMY      SOCIAL HISTORY: Social History   Socioeconomic History  . Marital status: Married    Spouse name: Sonia Side  . Number of children: 2  . Years of education: Not on file  . Highest education level: Master's degree (e.g., MA, MS, MEng, MEd, MSW, MBA)  Occupational History  . Occupation: retired  Tobacco Use  . Smoking status: Former    Types: Cigarettes    Quit date: 1965    Years since quitting: 85.6  . Smokeless tobacco: Never  . Tobacco comments:    smoked in college  Vaping Use  . Vaping Use: Never used  Substance and Sexual Activity  . Alcohol use: Yes    Alcohol/week: 7.0 - 14.0 standard drinks    Types: 7 - 14 Glasses of wine per week    Comment: 1-2 glasses of wine per night  . Drug use: No  . Sexual activity: Never  Other Topics Concern  . 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    Social Determinants of Health  Financial Resource Strain: Low Risk   . Difficulty of Paying Living Expenses: Not hard at all  Food Insecurity: No Food Insecurity  . Worried About Charity fundraiser in the Last Year: Never true  . Ran Out of Food in the Last Year: Never true  Transportation Needs: No Transportation Needs  . Lack of Transportation (Medical): No  . Lack of Transportation (Non-Medical): No  Physical Activity: Inactive  . Days of Exercise per Week: 0 days  . Minutes of Exercise per Session: 0 min  Stress: No Stress Concern Present  . Feeling of Stress : Not at all  Social Connections: Socially Integrated  . Frequency of Communication with Friends and Family: More than three times a week  . Frequency of Social Gatherings with Friends and Family: More than three times a  week  . Attends Religious Services: More than 4 times per year  . Active Member of Clubs or Organizations: Yes  . Attends Archivist Meetings: More than 4 times per year  . Marital Status: Married  Human resources officer Violence: Not At Risk  . Fear of Current or Ex-Partner: No  . Emotionally Abused: No  . Physically Abused: No  . Sexually Abused: No    FAMILY HISTORY: Family History  Problem Relation Age of Onset  . Cancer Mother        colon/rectal  . Colon cancer Mother   . Heart disease Father   . Hypertension Father   . Atrial fibrillation Father   . Diabetes Father   . Cancer Brother        lung  . Healthy Son   . Healthy Son   . Breast cancer Neg Hx     ALLERGIES:  is allergic to codeine, lorazepam, oxycodone, paregoric, penicillins, quinolones, and aleve [naproxen sodium].  MEDICATIONS:  Current Outpatient Medications  Medication Sig Dispense Refill  . acetaminophen (TYLENOL) 500 MG tablet Take 500 mg by mouth every 6 (six) hours as needed.    Marland Kitchen albuterol (PROVENTIL HFA;VENTOLIN HFA) 108 (90 Base) MCG/ACT inhaler Inhale 2 puffs into the lungs every 6 (six) hours as needed for wheezing or shortness of breath. 1 Inhaler 2  . ARIPiprazole (ABILIFY) 2 MG tablet TAKE 1 TABLET (2 MG TOTAL) BY MOUTH EVERY EVENING. 90 tablet 1  . aspirin 81 MG tablet Take 81 mg by mouth daily.    Marland Kitchen atorvastatin (LIPITOR) 40 MG tablet TAKE 1 TABLET BY MOUTH EVERY DAY 90 tablet 2  . bismuth subsalicylate (PEPTO BISMOL) 262 MG chewable tablet Chew 262 mg by mouth as needed. **capsule**    . Bismuth Subsalicylate 99991111 MG TABS Take 1 tablet by mouth as needed.    . calcium carbonate (TUMS - DOSED IN MG ELEMENTAL CALCIUM) 500 MG chewable tablet Chew 1 tablet by mouth as needed for indigestion or heartburn.    . clindamycin (CLEOCIN) 150 MG capsule Take 4 capsules by mouth as directed. 1 hour prior to dental procedure    . clobetasol ointment (TEMOVATE) 0.05 % APPLY TO AFFECTED AREAS AT NIGHT  AS NEEDED    . clotrimazole (LOTRIMIN) 1 % cream Apply 1 application topically 2 (two) times daily. 30 g 1  . ferrous sulfate 324 MG TBEC Take 324 mg by mouth daily with breakfast.    . fluocinonide (LIDEX) 0.05 % external solution APPLY TO AFFECTED AREAS OF SCALP DAILY UNTIL CLEAR, THEN FOR FLARES    . lisinopril (ZESTRIL) 5 MG tablet TAKE 1 TABLET BY MOUTH EVERY DAY  90 tablet 1  . Multiple Vitamins-Minerals (CENTRUM SILVER PO) Take 1 tablet by mouth daily.    Marland Kitchen omeprazole (PRILOSEC) 40 MG capsule Take 1 capsule (40 mg total) by mouth 2 (two) times daily. 180 capsule 1  . oxybutynin (DITROPAN-XL) 10 MG 24 hr tablet TAKE 1 TABLET BY MOUTH EVERY DAY 90 tablet 1  . Venlafaxine HCl 225 MG TB24 TAKE 1 TABLET BY MOUTH EVERY DAY 90 tablet 0  . rosuvastatin (CRESTOR) 10 MG tablet TAKE 1 TABLET BY MOUTH EVERY DAY 90 tablet 0   No current facility-administered medications for this visit.   PHYSICAL EXAMINATION:   Vitals:   09/02/20 1335  BP: 120/73  Pulse: 86  Resp: 18  Temp: 98.5 F (36.9 C)  SpO2: 95%   Filed Weights   09/02/20 1335  Weight: 182 lb (82.6 kg)    Physical Exam HENT:     Head: Normocephalic and atraumatic.     Mouth/Throat:     Pharynx: No oropharyngeal exudate.  Eyes:     Pupils: Pupils are equal, round, and reactive to light.  Cardiovascular:     Rate and Rhythm: Normal rate and regular rhythm.  Pulmonary:     Effort: No respiratory distress.     Breath sounds: No wheezing.  Abdominal:     General: Bowel sounds are normal. There is no distension.     Palpations: Abdomen is soft. There is no mass.     Tenderness: There is no abdominal tenderness. There is no guarding or rebound.  Musculoskeletal:        General: No tenderness. Normal range of motion.     Cervical back: Normal range of motion and neck supple.  Skin:    General: Skin is warm.  Neurological:     Mental Status: She is alert and oriented to person, place, and time.  Psychiatric:        Mood  and Affect: Affect normal.    LABORATORY DATA:  I have reviewed the data as listed Lab Results  Component Value Date   WBC 8.6 09/02/2020   HGB 12.6 09/02/2020   HCT 40.5 09/02/2020   MCV 86.7 09/02/2020   PLT 336 09/02/2020   Recent Labs    03/25/20 0829  NA 143  K 4.9  CL 104  CO2 22  GLUCOSE 112*  BUN 15  CREATININE 0.81  CALCIUM 9.5  PROT 7.0  ALBUMIN 4.5  AST 18  ALT 57*  ALKPHOS 104  BILITOT 0.2     CT ANGIO CHEST AORTA W/CM & OR WO/CM  Result Date: 08/16/2020 CLINICAL DATA:  Thoracic aortic aneurysm EXAM: CT ANGIOGRAPHY CHEST WITH CONTRAST TECHNIQUE: Multidetector CT imaging of the chest was performed using the standard protocol during bolus administration of intravenous contrast. Multiplanar CT image reconstructions and MIPs were obtained to evaluate the vascular anatomy. CONTRAST:  4m ISOVUE-370 IOPAMIDOL (ISOVUE-370) INJECTION 76% COMPARISON:  08/13/2019 FINDINGS: Cardiovascular: Mild atherosclerotic calcification of the aortic arch and branch vessels. The ascending thoracic aorta measures up to 4.6 cm in diameter on image 71 of series 5, previously 4.4 cm on 08/13/2019. Proximal ascending aorta 4.0 cm in diameter, proximal arch 3.4 cm in diameter, distal arch 2.9 cm in diameter, mid descending thoracic aorta 2.3 cm in diameter, and thoracic aorta at the hiatus 2.2 cm in diameter. Today's exam was not timed for pulmonary arterial opacification, but there is no large or central clot in the pulmonary arteries. Upper normal heart size. Mediastinum/Nodes: Borderline prominent right hilar  lymph node at 1.0 cm in diameter, previously the same. Lungs/Pleura: Mild biapical pleuroparenchymal scarring. Several adjacent ill-defined ground-glass opacities are present in the right upper lobe inferiorly on images 60 through 64 of series 7, measuring up to about 0.8 cm in diameter. This likely represents localized alveolitis but may warrant observation. Branching density in the right  middle lobe on image 101 series 7 is stable and compatible with a small mucocele. 3 mm subpleural nodule in the left upper lobe on image 66 series 7, unchanged. Upper Abdomen: Stable prominence of the common bile duct at about 2.2 cm in diameter, possibly a physiologic response to prior cholecystectomy. Musculoskeletal: Right breast implant. Review of the MIP images confirms the above findings. IMPRESSION: 1. Ascending thoracic aortic aneurysm, 4.6 cm in diameter (previously 4.4 cm last year). Recommend semi-annual imaging followup by CTA or MRA and referral to cardiothoracic surgery if not already obtained. This recommendation follows 2010 ACCF/AHA/AATS/ACR/ASA/SCA/SCAI/SIR/STS/SVM Guidelines for the Diagnosis and Management of Patients With Thoracic Aortic Disease. Circulation. 2010; [id number]. Aortic aneurysm NOS (ICD10-I71.9) 2. Faint ground-glass opacity inferiorly in the right upper lobe is new and probably represents localized alveolitis, but attention to this region on follow up exams is suggested. 3. Small mucocele in the right middle lobe, stable. 4. Stable borderline prominent right hilar lymph node at 1.0 cm in diameter. Electronically Signed   By: Van Clines M.D.   On: 08/16/2020 12:22    Iron deficiency #Iron deficient anemia-unclear etiology.  S/p Venofer-symptoms of fatigue improved; August 2022- hemoglobin is 12.6; iron saturation 22 ferritin greater than 70.  #Hold Venofer today.  Continue p.o. iron.  # Chronic diarrhea- [1-4/day]; awaiting colonoscopy with Dr. Alice Reichert.  # DISPOSITION: # No venofer  # follow up in 6 months- cbc/iron studies/ferritin; possible Venofer- Dr.B  All questions were answered. The patient knows to call the clinic with any problems, questions or concerns.    Cammie Sickle, MD 09/02/2020 2:17 PM

## 2020-09-02 NOTE — Assessment & Plan Note (Addendum)
#  Iron deficient anemia-unclear etiology.  S/p Venofer-symptoms of fatigue improved; August 2022- hemoglobin is 12.6; iron saturation 22 ferritin greater than 70.  #Hold Venofer today.  Continue p.o. iron.  # Chronic diarrhea- [1-4/day]; awaiting colonoscopy with Dr. Alice Reichert.  # DISPOSITION: # No venofer  # follow up in 6 months- cbc/iron studies/ferritin; possible Venofer- Dr.B

## 2020-09-02 NOTE — Progress Notes (Signed)
6 month follow-up. States that she will have a colonoscopy Wednesday due to chronic diarrhea.

## 2020-09-06 ENCOUNTER — Encounter: Payer: Self-pay | Admitting: Internal Medicine

## 2020-09-06 ENCOUNTER — Other Ambulatory Visit: Payer: Self-pay | Admitting: Family Medicine

## 2020-09-06 DIAGNOSIS — E1169 Type 2 diabetes mellitus with other specified complication: Secondary | ICD-10-CM

## 2020-09-06 DIAGNOSIS — F4322 Adjustment disorder with anxiety: Secondary | ICD-10-CM

## 2020-09-06 NOTE — Telephone Encounter (Signed)
Requested medication (s) are due for refill today: no  Requested medication (s) are on the active medication list: yes   Last refill:  06/14/2020  Future visit scheduled: yes  Notes to clinic:  Failed protocol:  Total Cholesterol in normal range and within 360 days   LDL in normal range and within 360 days   HDL in normal range and within 360 days   Triglycerides in normal range and within 360 days  Refill cannot be delegated     Requested Prescriptions  Pending Prescriptions Disp Refills   rosuvastatin (CRESTOR) 10 MG tablet [Pharmacy Med Name: ROSUVASTATIN CALCIUM 10 MG TAB] 90 tablet 0    Sig: TAKE 1 TABLET BY MOUTH EVERY DAY     Cardiovascular:  Antilipid - Statins Failed - 09/06/2020  9:27 AM      Failed - Total Cholesterol in normal range and within 360 days    Cholesterol, Total  Date Value Ref Range Status  03/25/2020 220 (H) 100 - 199 mg/dL Final          Failed - LDL in normal range and within 360 days    LDL Chol Calc (NIH)  Date Value Ref Range Status  03/25/2020 134 (H) 0 - 99 mg/dL Final          Failed - HDL in normal range and within 360 days    HDL  Date Value Ref Range Status  03/25/2020 39 (L) >39 mg/dL Final          Failed - Triglycerides in normal range and within 360 days    Triglycerides  Date Value Ref Range Status  03/25/2020 259 (H) 0 - 149 mg/dL Final          Passed - Patient is not pregnant      Passed - Valid encounter within last 12 months    Recent Outpatient Visits           5 months ago Encounter for annual physical exam   [facility], Dionne Bucy, MD   11 months ago Essential hypertension   [place], Dionne Bucy, MD   1 year ago Encounter for annual physical exam   [facility], Dionne Bucy, MD   1 year ago Pulmonary emphysema, unspecified emphysema type [facility])   [facility], Dionne Bucy, MD   2 years ago Primary  osteoarthritis of left knee   [place], Dionne Bucy, MD       Future Appointments             In 2 weeks [place], Dionne Bucy, MD [facility], [place]   In 6 months Bacigalupo, Dionne Bucy, MD [facility]             ARIPiprazole (ABILIFY) 2 MG tablet [Pharmacy Med Name: ARIPIPRAZOLE 2 MG TABLET] 90 tablet 1    Sig: TAKE 1 TABLET BY MOUTH EVERY EVENING.     Not Delegated - Psychiatry:  Antipsychotics - Second Generation (Atypical) - aripiprazole Failed - 09/06/2020  9:27 AM      Failed - This refill cannot be delegated      Passed - Valid encounter within last 6 months    Recent Outpatient Visits           5 months ago Encounter for annual physical exam   [facility], Dionne Bucy, MD   11 months ago Essential hypertension   [facility],  Dionne Bucy, MD   1 year ago Encounter for annual physical exam   [facility], Dionne Bucy, MD   1 year ago Pulmonary emphysema, unspecified emphysema type [facility])   [facility], Dionne Bucy, MD   2 years ago Primary osteoarthritis of left knee   [place], Dionne Bucy, MD       Future Appointments             In 2 weeks Bacigalupo, Dionne Bucy, MD [facility], [place]   In 6 months Bacigalupo, Dionne Bucy, MD [facility], [place]

## 2020-09-07 ENCOUNTER — Ambulatory Visit: Payer: Medicare PPO | Admitting: Certified Registered Nurse Anesthetist

## 2020-09-07 ENCOUNTER — Encounter
Admission: RE | Disposition: A | Discharge status: Home / Self Care | Payer: Self-pay | Source: Home / Self Care | Attending: Internal Medicine

## 2020-09-07 ENCOUNTER — Ambulatory Visit
Admission: RE | Admit: 2020-09-07 | Discharge: 2020-09-07 | Disposition: A | Discharge status: Home / Self Care | Payer: Medicare PPO | Attending: Internal Medicine | Admitting: Internal Medicine

## 2020-09-07 DIAGNOSIS — Z86010 Personal history of colonic polyps: Secondary | ICD-10-CM | POA: Diagnosis not present

## 2020-09-07 DIAGNOSIS — Z885 Allergy status to narcotic agent status: Secondary | ICD-10-CM | POA: Insufficient documentation

## 2020-09-07 DIAGNOSIS — K591 Functional diarrhea: Secondary | ICD-10-CM | POA: Diagnosis not present

## 2020-09-07 DIAGNOSIS — Z880 Allergy status to penicillin: Secondary | ICD-10-CM | POA: Insufficient documentation

## 2020-09-07 DIAGNOSIS — Z79899 Other long term (current) drug therapy: Secondary | ICD-10-CM | POA: Diagnosis not present

## 2020-09-07 DIAGNOSIS — K640 First degree hemorrhoids: Secondary | ICD-10-CM | POA: Diagnosis not present

## 2020-09-07 DIAGNOSIS — Z881 Allergy status to other antibiotic agents status: Secondary | ICD-10-CM | POA: Insufficient documentation

## 2020-09-07 DIAGNOSIS — K219 Gastro-esophageal reflux disease without esophagitis: Secondary | ICD-10-CM | POA: Diagnosis not present

## 2020-09-07 DIAGNOSIS — Z888 Allergy status to other drugs, medicaments and biological substances status: Secondary | ICD-10-CM | POA: Insufficient documentation

## 2020-09-07 DIAGNOSIS — Z7982 Long term (current) use of aspirin: Secondary | ICD-10-CM | POA: Diagnosis not present

## 2020-09-07 DIAGNOSIS — K5730 Diverticulosis of large intestine without perforation or abscess without bleeding: Secondary | ICD-10-CM | POA: Insufficient documentation

## 2020-09-07 DIAGNOSIS — Z886 Allergy status to analgesic agent status: Secondary | ICD-10-CM | POA: Diagnosis not present

## 2020-09-07 DIAGNOSIS — Z1211 Encounter for screening for malignant neoplasm of colon: Secondary | ICD-10-CM | POA: Diagnosis not present

## 2020-09-07 DIAGNOSIS — K648 Other hemorrhoids: Secondary | ICD-10-CM | POA: Diagnosis not present

## 2020-09-07 HISTORY — DX: Gout, unspecified: M10.9

## 2020-09-07 HISTORY — PX: COLONOSCOPY WITH PROPOFOL: SHX5780

## 2020-09-07 LAB — GLUCOSE, CAPILLARY: Glucose-Capillary: 111 mg/dL — ABNORMAL HIGH (ref 70–99)

## 2020-09-07 SURGERY — COLONOSCOPY WITH PROPOFOL
Anesthesia: General

## 2020-09-07 MED ORDER — PROPOFOL 500 MG/50ML IV EMUL
INTRAVENOUS | Status: DC | PRN
  Administered 2020-09-07: 160 ug/kg/min via INTRAVENOUS

## 2020-09-07 MED ORDER — SODIUM CHLORIDE 0.9 % IV SOLN
INTRAVENOUS | Status: DC
  Administered 2020-09-07: 20 mL/h via INTRAVENOUS

## 2020-09-07 MED ORDER — PROPOFOL 10 MG/ML IV BOLUS
INTRAVENOUS | Status: DC | PRN
  Administered 2020-09-07: 60 mg via INTRAVENOUS
  Administered 2020-09-07: 10 mg via INTRAVENOUS

## 2020-09-07 NOTE — H&P (Signed)
Outpatient short stay form Pre-procedure 09/07/2020 11:06 AM Natasha Gottsch K. Alice Reichert, M.D.  Primary Physician: Lavon Paganini, M.D.  Reason for visit:  Functional diarrhea, personal history of colon polyps - 2017  History of present illness:  Patient with persistent postprandial diarrhea and bloating presents for colonoscopy. Denies bleeding, weight loss, abdominal pain, anorexia. NO vomiting.     Current Facility-Administered Medications:    0.9 %  sodium chloride infusion, , Intravenous, Continuous, [place], Benay Pike, MD, Last Rate: 20 mL/hr at 09/07/20 1038, Continued from Pre-op at 09/07/20 1038  Medications Prior to Admission  Medication Sig Dispense Refill Last Dose   acetaminophen (TYLENOL) 500 MG tablet Take 500 mg by mouth every 6 (six) hours as needed.   Past Week   albuterol (PROVENTIL HFA;VENTOLIN HFA) 108 (90 Base) MCG/ACT inhaler Inhale 2 puffs into the lungs every 6 (six) hours as needed for wheezing or shortness of breath. 1 Inhaler 2 Past Week   ARIPiprazole (ABILIFY) 2 MG tablet TAKE 1 TABLET BY MOUTH EVERY EVENING. 90 tablet 1 09/06/2020   aspirin 81 MG tablet Take 81 mg by mouth daily.   Past Week   atorvastatin (LIPITOR) 40 MG tablet TAKE 1 TABLET BY MOUTH EVERY DAY 90 tablet 2 Past Week   bismuth subsalicylate (PEPTO BISMOL) 262 MG chewable tablet Chew 262 mg by mouth as needed. **capsule**   Past Week   Bismuth Subsalicylate 99991111 MG TABS Take 1 tablet by mouth as needed.   Past Week   calcium carbonate (TUMS - DOSED IN MG ELEMENTAL CALCIUM) 500 MG chewable tablet Chew 1 tablet by mouth as needed for indigestion or heartburn.   Past Week   clindamycin (CLEOCIN) 150 MG capsule Take 4 capsules by mouth as directed. 1 hour prior to dental procedure   Past Week   clobetasol ointment (TEMOVATE) 0.05 % APPLY TO AFFECTED AREAS AT NIGHT AS NEEDED   Past Week   clotrimazole (LOTRIMIN) 1 % cream Apply 1 application topically 2 (two) times daily. 30 g 1 Past Week   ferrous sulfate  324 MG TBEC Take 324 mg by mouth daily with breakfast.   Past Week   fluocinonide (LIDEX) 0.05 % external solution APPLY TO AFFECTED AREAS OF SCALP DAILY UNTIL CLEAR, THEN FOR FLARES   Past Week   lisinopril (ZESTRIL) 5 MG tablet TAKE 1 TABLET BY MOUTH EVERY DAY 90 tablet 1 Past Week   Multiple Vitamins-Minerals (CENTRUM SILVER PO) Take 1 tablet by mouth daily.   Past Month   omeprazole (PRILOSEC) 40 MG capsule Take 1 capsule (40 mg total) by mouth 2 (two) times daily. 180 capsule 1 Past Week   oxybutynin (DITROPAN-XL) 10 MG 24 hr tablet TAKE 1 TABLET BY MOUTH EVERY DAY 90 tablet 1 Past Week   rosuvastatin (CRESTOR) 10 MG tablet TAKE 1 TABLET BY MOUTH EVERY DAY 90 tablet 1 Past Week   Venlafaxine HCl 225 MG TB24 TAKE 1 TABLET BY MOUTH EVERY DAY 90 tablet 0 Past Week     Allergies  Allergen Reactions   Codeine Other (See Comments)    Chest pain   Lorazepam Other (See Comments)    "Feels loopy"   Oxycodone Other (See Comments)    MENTAL CHANGES   Paregoric     Chest pain---tolerated MORPHINE   Penicillins Hives    Has patient had a PCN reaction causing immediate rash, facial/tongue/throat swelling, SOB or lightheadedness with hypotension: Yes Has patient had a PCN reaction causing severe rash involving mucus membranes or skin necrosis:  No Has patient had a PCN reaction that required hospitalization: NO Has patient had a PCN reaction occurring within the last 10 years: No If all of the above answers are "NO", then may proceed with Cephalosporin use.  Can take Augmentin   Quinolones     PATIENT UNAWARE OF allergy to this class of antibiotics, only aware of allergy to [facility] Patient was warned about not using Cipro and similar antibiotics. 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  ER/PR NEGATIVE   Cataract    Chronic cough    Complication of anesthesia    20 years ago pts b/p dropped really low, "coded"   Depression    Depression    Diabetes mellitus without complication (HCC)    diet controlled   Family history of adverse reaction to anesthesia    son PONV   GERD (gastroesophageal reflux disease)    Gout    Hypercholesteremia    Hyperlipidemia    Hypertension    Melanoma ([place])    MVP (mitral valve prolapse)    followed by PCP   Nephritis    Nephritis    S/P appendectomy    Thoracic aortic aneurysm ([place])    Torn meniscus    left    Review of systems:  Otherwise negative.    Physical Exam  Gen: Alert, oriented. Appears stated age.  HEENT: [place]/AT. PERRLA. Lungs: CTA, no wheezes. CV: RR nl S1, S2. Abd: soft, benign, no masses. BS+ Ext: No edema. Pulses 2+    Planned procedures: Proceed with colonoscopy. The patient understands the nature of the planned procedure, indications, risks, alternatives and potential complications including but not limited to bleeding, infection, perforation, damage to internal organs and possible oversedation/side effects from anesthesia. The patient agrees and gives consent to proceed.  Please refer to procedure notes for findings, recommendations and patient disposition/instructions.     Natasha Vasquez K. Alice Reichert, M.D. Gastroenterology 09/07/2020  11:06 AM

## 2020-09-07 NOTE — Transfer of Care (Signed)
Immediate Anesthesia Transfer of Care Note  Patient: Natasha Vasquez  Procedure(s) Performed: COLONOSCOPY WITH PROPOFOL  Patient Location: PACU  Anesthesia Type:General  Level of Consciousness: drowsy  Airway & Oxygen Therapy: Patient Spontanous Breathing and Patient connected to nasal cannula oxygen  Post-op Assessment: Report given to RN and Post -op Vital signs reviewed and stable  Post vital signs: Reviewed and stable  Last Vitals:  Vitals Value Taken Time  BP    Temp    Pulse 88 09/07/20 1136  Resp 19 09/07/20 1136  SpO2 92 % 09/07/20 1136  Vitals shown include unvalidated device data.  Last Pain:  Vitals:   09/07/20 1017  TempSrc: Temporal  PainSc: 0-No pain         Complications: No notable events documented.

## 2020-09-07 NOTE — Anesthesia Preprocedure Evaluation (Addendum)
Anesthesia Evaluation  Patient identified by MRN, date of birth, ID band Patient awake    Reviewed: Allergy & Precautions, H&P , NPO status , Patient's Chart, lab work & pertinent test results  History of Anesthesia Complications (+) Family history of anesthesia reaction and history of anesthetic complications (remote history of low blood pressure associated with an anesthetic requiring a code event)  Airway Mallampati: III  TM Distance: >3 FB Neck ROM: full    Dental no notable dental hx.    Pulmonary COPD, former smoker,    Pulmonary exam normal breath sounds clear to auscultation       Cardiovascular Exercise Tolerance: Good hypertension, Normal cardiovascular exam+ Valvular Problems/Murmurs (MVP)  Rhythm:regular Rate:Normal     Neuro/Psych PSYCHIATRIC DISORDERS Depression  Neuromuscular disease    GI/Hepatic GERD  ,  Endo/Other  diabetes, Well ControlledHypothyroidism   Renal/GU Renal disease     Musculoskeletal  (+) Arthritis ,   Abdominal Normal abdominal exam  (+) - obese,   Peds  Hematology  (+) Blood dyscrasia, anemia ,   Anesthesia Other Findings   Reproductive/Obstetrics                             Anesthesia Physical  Anesthesia Plan  ASA: III  Anesthesia Plan: General   Post-op Pain Management:    Induction:   PONV Risk Score and Plan: 2 and TIVA and Treatment may vary due to age or medical condition  Airway Management Planned: Nasal Cannula and Natural Airway  Additional Equipment:   Intra-op Plan:   Post-operative Plan:   Informed Consent: I have reviewed the patients History and Physical, chart, labs and discussed the procedure including the risks, benefits and alternatives for the proposed anesthesia with the patient or authorized representative who has indicated his/her understanding and acceptance.     Dental advisory given  Plan Discussed with: CRNA  and Anesthesiologist  Anesthesia Plan Comments:        Anesthesia Quick Evaluation  Patient Active Problem List   Diagnosis Date Noted  . PAD (peripheral artery disease) ([place]) 06/24/2020  . Iron deficiency 10/07/2018  . Primary osteoarthritis of left knee 09/17/2018  . Emphysema lung ([place]) 03/14/2018  . Diabetic peripheral neuropathy associated with type 2 diabetes mellitus ([place]) 03/14/2018  . Metatarsalgia of both feet 03/14/2018  . Abnormality of lung 03/14/2018  . Acquired absence of right breast 06/11/2017  . 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 Dysphagia 03/11/2017  . Class 1 obesity with serious comorbidity and body mass index (BMI) of 31.0 to 31.9 in adult 03/11/2017  . Subclinical hypothyroidism 06/08/2015  . Osteopenia 06/08/2015  . Billowing mitral valve 06/08/2015  . Melanoma in situ ([place]) 06/08/2015  . Cramps of lower extremity 06/08/2015  . Microcytic anemia 06/08/2015  . Type 2 diabetes mellitus ([place]) 05/18/2015  . Chronic diarrhea 05/18/2015  . Clinical depression 01/26/2015  . Hyperlipidemia associated with type 2 diabetes mellitus ([place]) 10/28/2014  . Hypertension associated with diabetes ([place]) 09/27/2014  . GERD (gastroesophageal reflux disease) 08/06/2014  . Overactive bladder 07/16/2014    CBC Latest Ref Rng & Units 09/02/2020 03/04/2020 09/04/2019  WBC 4.0 - 10.5 K/uL 8.6 6.7 9.1  Hemoglobin 12.0 - 15.0 g/dL 12.6 12.2 12.9  Hematocrit 36.0 - 46.0 % 40.5 38.4 38.7  Platelets 150 - 400 K/uL 336 343 331   BMP  Latest Ref Rng & Units 03/25/2020 08/05/2019 09/17/2018  Glucose 65 - 99 mg/dL 112(H) 135(H) 76  BUN 8 - 27 mg/dL '15 17 13  '$ Creatinine 0.57 - 1.00 mg/dL 0.81 1.11(H) 0.91  BUN/Creat Ratio 12 - '28 19 15 14  '$ Sodium 134 - 144 mmol/L 143 144 140  Potassium 3.5 - 5.2 mmol/L 4.9 4.7 4.3  Chloride 96 - 106 mmol/L 104 104 103  CO2 20 - 29 mmol/L '22 23 23   '$ Calcium 8.7 - 10.3 mg/dL 9.5 9.8 9.3    Risks and benefits of anesthesia discussed at length, patient or surrogate demonstrates understanding. Appropriately NPO. Plan to proceed with anesthesia.  Champ Mungo, MD 09/07/20

## 2020-09-07 NOTE — Op Note (Signed)
[facility] Gastroenterology Patient Name: Natasha Vasquez Procedure Date: 09/07/2020 10:59 AM MRN: [id number] Account #: [id number] Date of Birth: 04-16-1940 Admit Type: Outpatient Age: 80 Room: [facility] ENDO ROOM 2 Gender: Female Note Status: Finalized Procedure:             Colonoscopy Indications:           Functional diarrhea Providers:             Benay Pike. Alice Reichert MD, MD Referring MD:          Dionne Bucy. Bacigalupo (Referring MD) Medicines:             Propofol per Anesthesia Complications:         No immediate complications. Procedure:             Pre-Anesthesia Assessment:                        - The risks and benefits of the procedure and the                         sedation options and risks were discussed with the                         patient. All questions were answered and informed                         consent was obtained.                        - Patient identification and proposed procedure were                         verified prior to the procedure by the nurse. The                         procedure was verified in the procedure room.                        - ASA Grade Assessment: III - A patient with severe                         systemic disease.                        - After reviewing the risks and benefits, the patient                         was deemed in satisfactory condition to undergo the                         procedure.                        After obtaining informed consent, the colonoscope was                         passed under direct vision. Throughout the procedure,                         the patient's blood pressure, pulse, and oxygen  saturations were monitored continuously. The                         Colonoscope was introduced through the anus and                         advanced to the the cecum, identified by appendiceal                         orifice and ileocecal valve. The colonoscopy was                          performed without difficulty. The patient tolerated                         the procedure well. The quality of the bowel                         preparation was adequate. The ileocecal valve,                         appendiceal orifice, and rectum were photographed. Findings:      The perianal and digital rectal examinations were normal. Pertinent       negatives include normal sphincter tone and no palpable rectal lesions.      Non-bleeding internal hemorrhoids were found during retroflexion. The       hemorrhoids were Grade I (internal hemorrhoids that do not prolapse).      Many small-mouthed diverticula were found in the entire colon. There was       no evidence of diverticular bleeding.      Normal mucosa was found in the entire colon. Biopsies for histology were       taken with a cold forceps from the random colon for evaluation of       microscopic colitis.      There is no endoscopic evidence of inflammation, mass, polyps or       stenosis in the entire colon. Impression:            - Non-bleeding internal hemorrhoids.                        - Mild diverticulosis in the entire examined colon.                         There was no evidence of diverticular bleeding.                        - Normal mucosa in the entire examined colon. Biopsied. Recommendation:        - Patient has a contact number available for                         emergencies. The signs and symptoms of potential                         delayed complications were discussed with the patient.                         Return to normal activities tomorrow. Written  discharge instructions were provided to the patient.                        - Resume previous diet.                        - Continue present medications.                        - Await pathology results.                        - Return to physician assistant in 3 months.                        - Follow up with  Octavia Bruckner, PA-C in the GI office.                         [phone]                        - The findings and recommendations were discussed with                         the patient. Procedure Code(s):     --- Professional ---                        [id number], Colonoscopy, flexible; with biopsy, single or                         multiple Diagnosis Code(s):     --- Professional ---                        K57.30, Diverticulosis of large intestine without                         perforation or abscess without bleeding                        K59.1, Functional diarrhea                        K64.0, First degree hemorrhoids CPT copyright 2019 American Medical Association. All rights reserved. The codes documented in this report are preliminary and upon coder review may  be revised to meet current compliance requirements. Efrain Sella MD, MD 09/07/2020 11:38:29 AM This report has been signed electronically. Number of Addenda: 0 Note Initiated On: 09/07/2020 10:59 AM Scope Withdrawal Time: 0 hours 7 minutes 22 seconds  Total Procedure Duration: 0 hours 16 minutes 22 seconds  Estimated Blood Loss:  Estimated blood loss: none.      [facility]

## 2020-09-08 ENCOUNTER — Encounter: Payer: Self-pay | Admitting: Internal Medicine

## 2020-09-08 LAB — SURGICAL PATHOLOGY

## 2020-09-08 NOTE — Anesthesia Postprocedure Evaluation (Signed)
Anesthesia Post Note  Patient: Natasha Vasquez  Procedure(s) Performed: COLONOSCOPY WITH PROPOFOL  Patient location during evaluation: Endoscopy Anesthesia Type: General Level of consciousness: awake and alert Pain management: pain level controlled Vital Signs Assessment: post-procedure vital signs reviewed and stable Respiratory status: spontaneous breathing, nonlabored ventilation and respiratory function stable Cardiovascular status: blood pressure returned to baseline and stable Postop Assessment: no apparent nausea or vomiting Anesthetic complications: no   No notable events documented.   Last Vitals:  Vitals:   09/07/20 1150 09/07/20 1200  BP: (!) 163/101 (!) 165/101  Pulse: 81 75  Resp: 16 (!) 26  Temp:    SpO2: 99% 99%    Last Pain:  Vitals:   09/07/20 1130  TempSrc: Temporal  PainSc:                  [place] Ouch

## 2020-09-10 DIAGNOSIS — R0781 Pleurodynia: Secondary | ICD-10-CM | POA: Diagnosis not present

## 2020-09-10 DIAGNOSIS — S6991XA Unspecified injury of right wrist, hand and finger(s), initial encounter: Secondary | ICD-10-CM | POA: Diagnosis not present

## 2020-09-10 DIAGNOSIS — S0101XA Laceration without foreign body of scalp, initial encounter: Secondary | ICD-10-CM | POA: Diagnosis not present

## 2020-09-10 DIAGNOSIS — S63501A Unspecified sprain of right wrist, initial encounter: Secondary | ICD-10-CM | POA: Diagnosis not present

## 2020-09-16 ENCOUNTER — Other Ambulatory Visit: Payer: Self-pay | Admitting: Family Medicine

## 2020-09-22 ENCOUNTER — Ambulatory Visit: Payer: Self-pay | Admitting: Family Medicine

## 2020-09-29 DIAGNOSIS — H35372 Puckering of macula, left eye: Secondary | ICD-10-CM | POA: Diagnosis not present

## 2020-09-29 DIAGNOSIS — Z0100 Encounter for examination of eyes and vision without abnormal findings: Secondary | ICD-10-CM | POA: Diagnosis not present

## 2020-09-29 DIAGNOSIS — E119 Type 2 diabetes mellitus without complications: Secondary | ICD-10-CM | POA: Diagnosis not present

## 2020-09-29 LAB — HM DIABETES EYE EXAM

## 2020-10-05 IMAGING — CT CT CHEST WITHOUT CONTRAST
2 of 4 series · 15 of 36 positions shown, 18 images · non-contrast
Comparison: 01/23/2018.

CLINICAL DATA: Pulmonary nodules.

EXAM:
CT CHEST WITHOUT CONTRAST
TECHNIQUE: Multidetector CT imaging of the chest was performed following the
standard protocol without IV contrast.

[Series 2: chest · axial · 0.66mm/px · z∈[-1204,-902]mm · 12 of 179 slices shown, 15 images (1 of 2)]
[im 14/179  mediastinal]
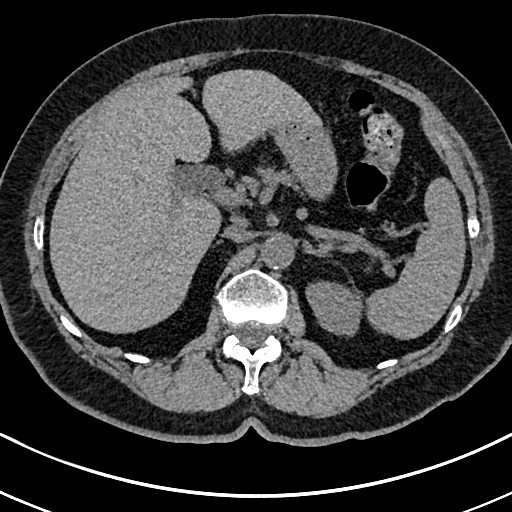
[im 14/179  lung]
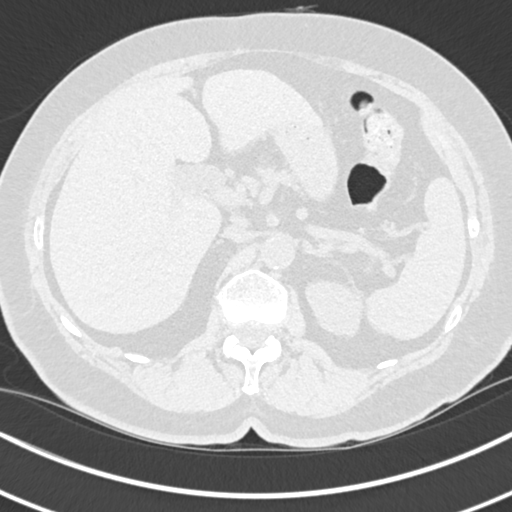
[im 28/179  lung]
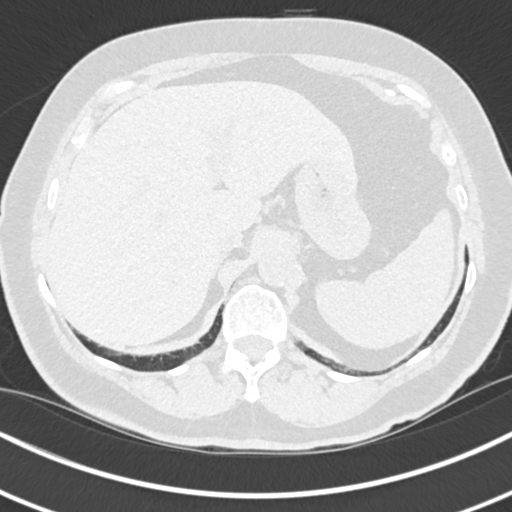
[im 42/179  lung]
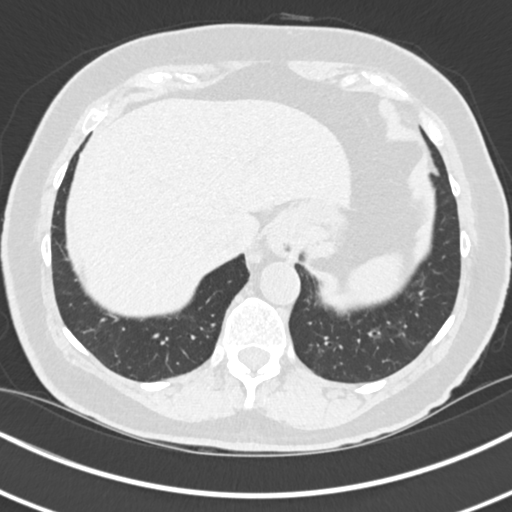
[im 55/179  lung]
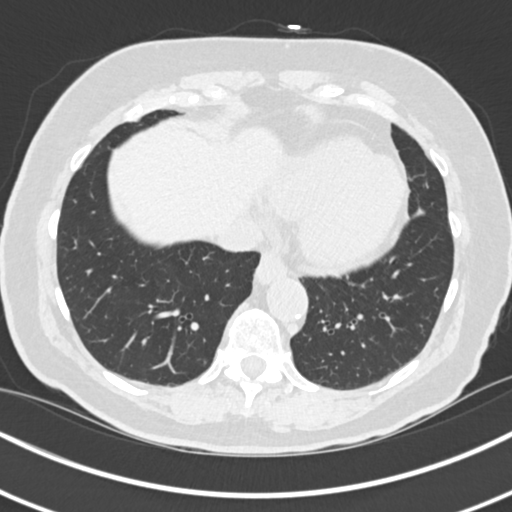
[im 69/179  mediastinal]
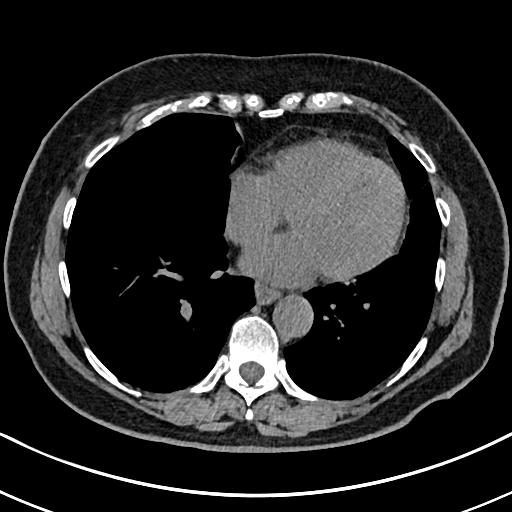
[im 69/179  lung]
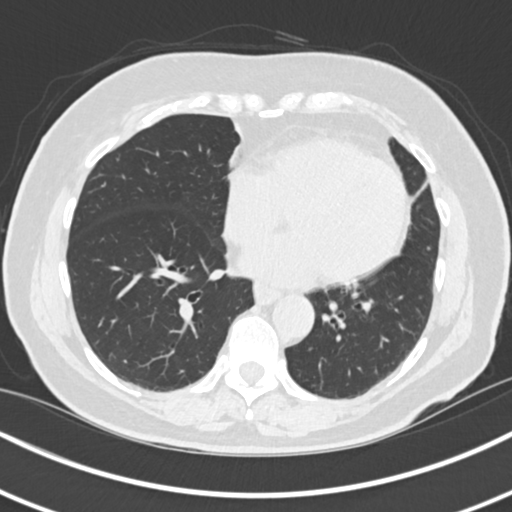
[im 83/179  lung]
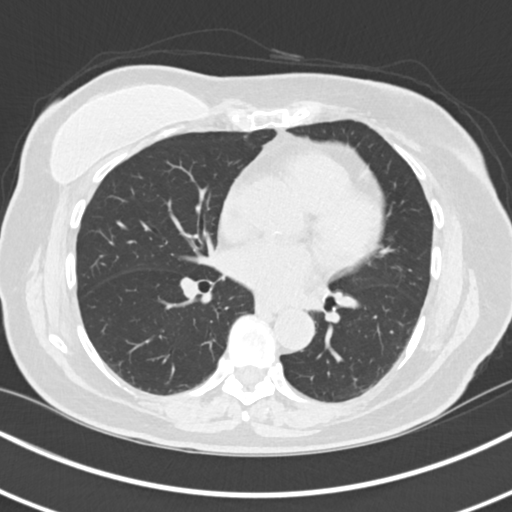
[im 96/179  lung]
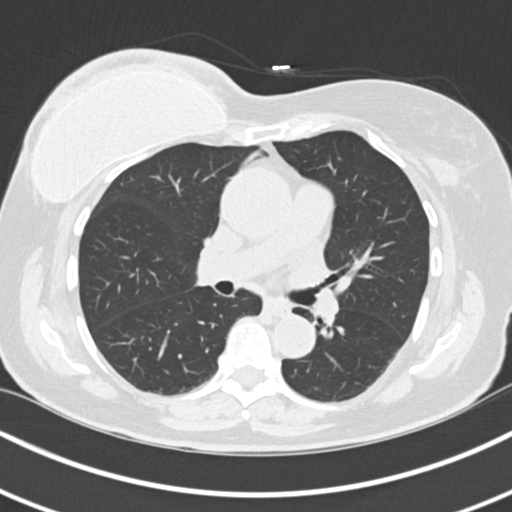
[im 110/179  lung]
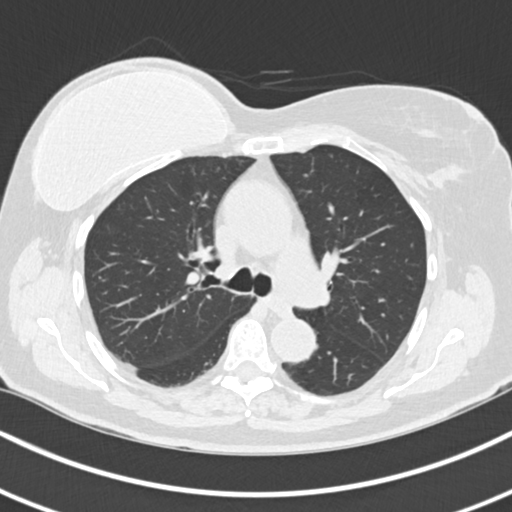
[im 124/179  mediastinal]
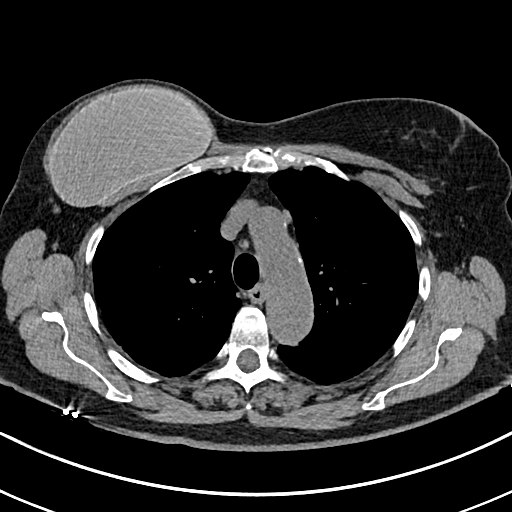
[im 124/179  lung]
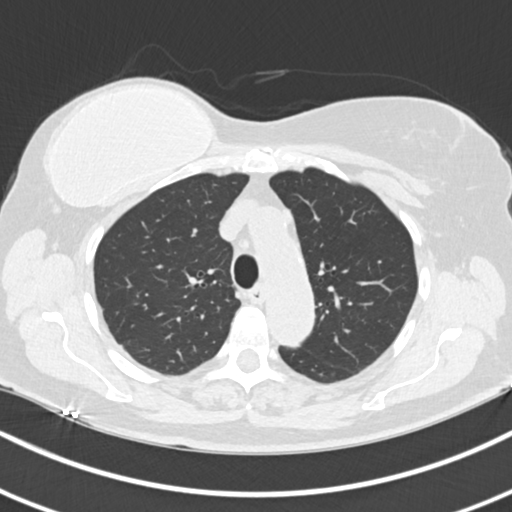
[im 137/179  lung]
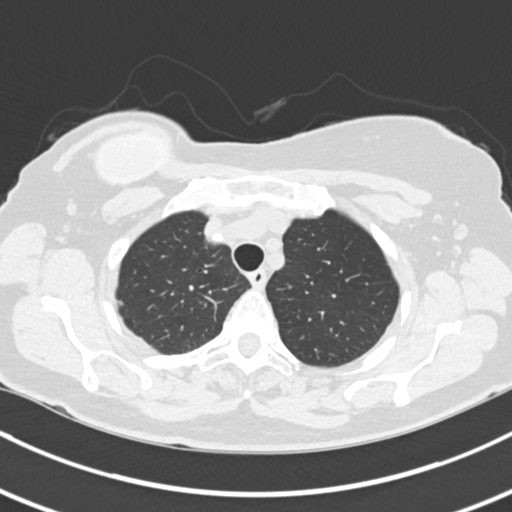
[im 151/179  lung]
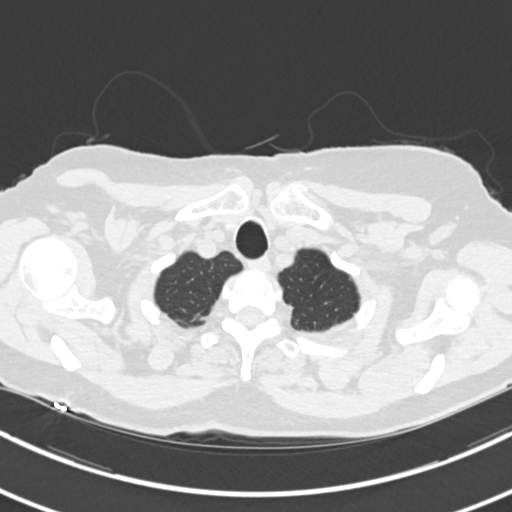
[im 165/179  lung]
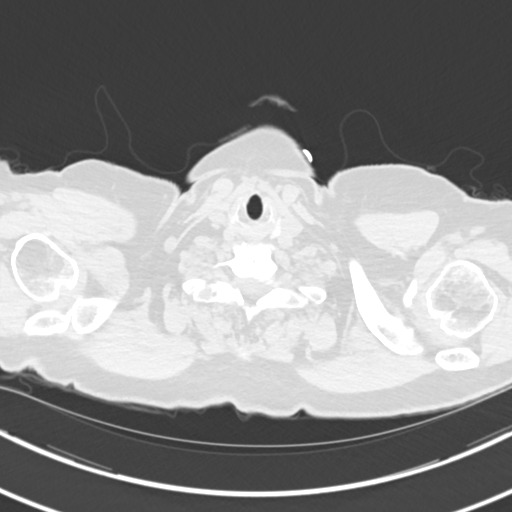

[Series 5: chest · coronal · 0.66mm/px · 3 of 162 slices shown (2 of 2)]
[im 33/162  lung]
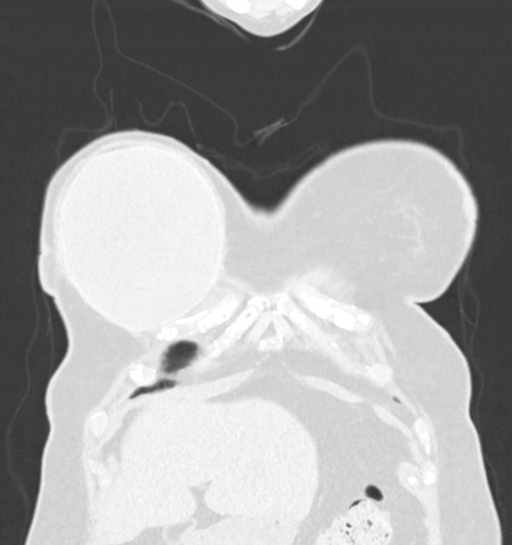
[im 65/162  lung]
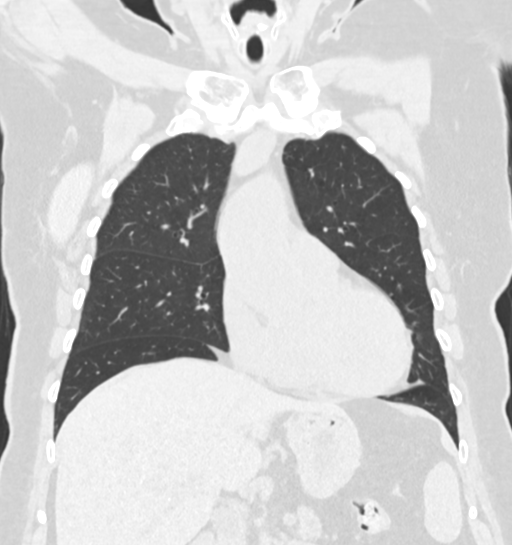
[im 97/162  lung]
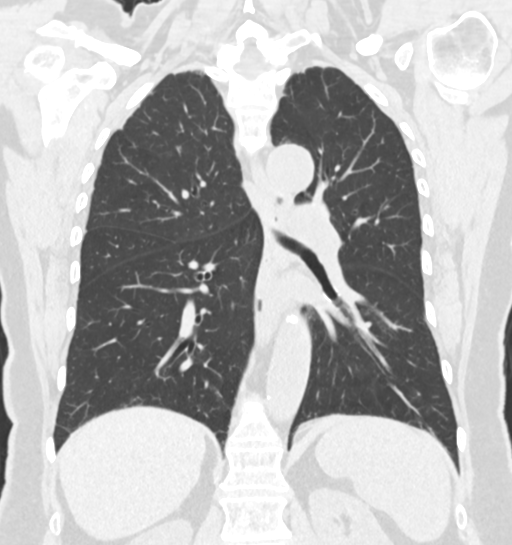

[15 of 36 positions shown; findings below may reference images not displayed]

FINDINGS: Cardiovascular: There is aneurysmal dilatation of the ascending
aorta, currently measuring 4.6 cm. The heart size is normal.
Coronary artery calcifications and thoracic aortic calcifications
are noted.

Mediastinum/Nodes: No enlarged mediastinal or axillary lymph nodes.
Thyroid gland, trachea, and esophagus demonstrate no significant
findings.

Lungs/Pleura: There are a few small 3-4 mm pulmonary nodules in the
right upper lobe which are stable from prior study (axial series 3,
image 45). There is a stable branching opacity in the right middle
lobe, consistent with mucous plugging. There is a stable 4 mm
pulmonary nodule in the left peripheral upper lobe. There is
improved aeration at the left lung base with near complete
resolution of the previously demonstrated area of consolidation.
There are few scattered calcified granulomas bilaterally.

Upper Abdomen: There is a small hiatal hernia. The patient appears
to be status post cholecystectomy. There is stable dilatation of the
common bile duct.

Musculoskeletal: A right-sided breast implant is noted. There is
diffuse skin thickening over the right breast implant. This appears
stable from prior study. There is a stable nodular opacity in the
left breast.
IMPRESSION: 1. Near complete resolution of the previously demonstrated opacity
at the left lung base, consistent with a benign etiology.
2. Stable bilateral pulmonary nodules measuring up to approximately
3-4 mm. These can be evaluated on subsequent follow-up studies for
the patient's thoracic aortic aneurysm detailed below.
3. Ascending thoracic aortic aneurysm measuring approximately 4.6 cm
in diameter. Ascending thoracic aortic aneurysm. Recommend
semi-annual imaging followup by CTA or MRA and referral to
cardiothoracic surgery if not already obtained. This recommendation
follows 9393 ACCF/AHA/AATS/ACR/ASA/SCA/ROSETTA/HARSCH/ZACH/VERISH Guidelines
for the Diagnosis and Management of Patients With Thoracic Aortic
Disease. Circulation. 9393; 121: [id number]. Aortic aneurysm NOS
(Y8R2N-8T7.4)
4. Unchanged skin thickening overlying the patient's right breast
implant. Stable left breast nodular opacities are again noted.
Correlation with recent mammography is recommended.

Aortic Atherosclerosis (Y8R2N-6IR.R).

## 2020-10-13 ENCOUNTER — Ambulatory Visit: Payer: Self-pay | Admitting: *Deleted

## 2020-10-13 DIAGNOSIS — M109 Gout, unspecified: Secondary | ICD-10-CM | POA: Diagnosis not present

## 2020-10-13 DIAGNOSIS — M25571 Pain in right ankle and joints of right foot: Secondary | ICD-10-CM | POA: Diagnosis not present

## 2020-10-13 NOTE — Telephone Encounter (Signed)
Noted  

## 2020-10-13 NOTE — Telephone Encounter (Signed)
Summary: Clinical Advice   Patient states she has been experiencing for 5 years swollen feet off and on. Patient foot and leg is currently swollen and painful to touch. Patient requesting a referral to a podiatrist.      Reason for Disposition  [1] Thigh or calf pain AND [2] only 1 side AND [3] present > 1 hour  Answer Assessment - Initial Assessment Questions 1. ONSET: "When did the swelling start?" (e.g., minutes, hours, days)     Chronic foot swelling- now swelling into leg- right 2. LOCATION: "What part of the leg is swollen?"  "Are both legs swollen or just one leg?"    Foot to the knee-right 3. SEVERITY: "How bad is the swelling?" (e.g., localized; mild, moderate, severe)  - Localized - small area of swelling localized to one leg  - MILD pedal edema - swelling limited to foot and ankle, pitting edema < 1/4 inch (6 mm) deep, rest and elevation eliminate most or all swelling  - MODERATE edema - swelling of lower leg to knee, pitting edema > 1/4 inch (6 mm) deep, rest and elevation only partially reduce swelling  - SEVERE edema - swelling extends above knee, facial or hand swelling present      moderate 4. REDNESS: "Does the swelling look red or infected?"     Main- inside of R ankle-red and over R toes to arch 5. PAIN: "Is the swelling painful to touch?" If Yes, ask: "How painful is it?"   (Scale 1-10; mild, moderate or severe)     6-7 pain level 6. FEVER: "Do you have a fever?" If Yes, ask: "What is it, how was it measured, and when did it start?"      no 7. CAUSE: "What do you think is causing the leg swelling?"     unknown 8. MEDICAL HISTORY: "Do you have a history of heart failure, kidney disease, liver failure, or cancer?"     Kidney disease as child, cancer 67. RECURRENT SYMPTOM: "Have you had leg swelling before?" If Yes, ask: "When was the last time?" "What happened that time?"     Not had the leg swell and have pain this bad before 10. OTHER SYMPTOMS: "Do you have any other  symptoms?" (e.g., chest pain, difficulty breathing)       Out of breath 11. PREGNANCY: "Is there any chance you are pregnant?" "When was your last menstrual period?"       N/a  Protocols used: Leg Swelling and Edema-A-AH

## 2020-10-13 NOTE — Telephone Encounter (Signed)
Call to patient- patient reports changes in her swelling- she has swelling in R foot/ankle and up to knee now. Patient reports it is painful and she does have some redness in her foot. Patient also has had recent travel out of state. Patient advised UC for evaluation.

## 2020-10-24 ENCOUNTER — Other Ambulatory Visit: Payer: Self-pay | Admitting: Family Medicine

## 2020-10-24 DIAGNOSIS — I10 Essential (primary) hypertension: Secondary | ICD-10-CM

## 2020-10-24 DIAGNOSIS — N3281 Overactive bladder: Secondary | ICD-10-CM

## 2020-10-25 NOTE — Telephone Encounter (Signed)
Requested medication (s) are due for refill today- yes  Requested medication (s) are on the active medication list -yes  Future visit scheduled -yes  Last refill: 04/21/20 #90 1RF  Notes to clinic: Call to patient- first available appointment with PCP- 11/17- patient request sooner appointment she has been newly diagnosed with gout and wants to be seen before 1 month and needs medication f/u appointment. Advised would send request for review.    Requested Prescriptions  Pending Prescriptions Disp Refills   lisinopril (ZESTRIL) 5 MG tablet [Pharmacy Med Name: LISINOPRIL 5 MG TABLET] 90 tablet 1    Sig: TAKE 1 TABLET BY MOUTH EVERY DAY     Cardiovascular:  ACE Inhibitors Failed - 10/24/2020 10:30 PM      Failed - Cr in normal range and within 180 days    Creatinine, Ser  Date Value Ref Range Status  03/25/2020 0.81 0.57 - 1.00 mg/dL Final          Failed - K in normal range and within 180 days    Potassium  Date Value Ref Range Status  03/25/2020 4.9 3.5 - 5.2 mmol/L Final          Failed - Last BP in normal range    BP Readings from Last 1 Encounters:  09/07/20 (!) 165/101          Failed - Valid encounter within last 6 months    Recent Outpatient Visits           7 months ago Encounter for annual physical exam   [facility], Dionne Bucy, MD   1 year ago Essential hypertension   [place], Dionne Bucy, MD   1 year ago Encounter for annual physical exam   [facility] [place], Dionne Bucy, MD   2 years ago Pulmonary emphysema, unspecified emphysema type [facility])   [facility], Dionne Bucy, MD   2 years ago Primary osteoarthritis of left knee   [place], Dionne Bucy, MD       Future Appointments             In 4 months Bacigalupo, Dionne Bucy, MD [facility], [place] - Patient is not pregnant      Signed Prescriptions  Disp Refills   oxybutynin (DITROPAN-XL) 10 MG 24 hr tablet 90 tablet 1    Sig: TAKE 1 TABLET BY [place]     Urology:  Bladder Agents Passed - 10/24/2020 10:30 PM      Passed - Valid encounter within last 12 months    Recent Outpatient Visits           7 months ago Encounter for annual physical exam   [facility] [place], Dionne Bucy, MD   1 year ago Essential hypertension   [place], Dionne Bucy, MD   1 year ago Encounter for annual physical exam   [facility] [place], Dionne Bucy, MD   2 years ago Pulmonary emphysema, unspecified emphysema type [facility])   [facility], Dionne Bucy, MD   2 years ago Primary osteoarthritis of left knee   [facility] [place], Dionne Bucy, MD       Future Appointments             In 4 months Bacigalupo, Dionne Bucy, MD [facility], [place]

## 2020-10-26 NOTE — Telephone Encounter (Signed)
Please review.  Are you able to fit her in sooner then 11/17?   Thanks,   -Mickel Baas

## 2020-10-28 NOTE — Telephone Encounter (Signed)
Ok to refill lisinopril.  Please see if there are any ok for 20 min appts upcoming.

## 2020-11-04 ENCOUNTER — Encounter: Payer: Self-pay | Admitting: Family Medicine

## 2020-11-04 ENCOUNTER — Other Ambulatory Visit: Payer: Self-pay

## 2020-11-04 ENCOUNTER — Other Ambulatory Visit: Payer: Self-pay | Admitting: Family Medicine

## 2020-11-04 ENCOUNTER — Ambulatory Visit (INDEPENDENT_AMBULATORY_CARE_PROVIDER_SITE_OTHER): Payer: Medicare PPO | Admitting: Family Medicine

## 2020-11-04 VITALS — BP 137/87 | HR 77 | Wt 184.0 lb

## 2020-11-04 DIAGNOSIS — M1A9XX0 Chronic gout, unspecified, without tophus (tophi): Secondary | ICD-10-CM

## 2020-11-04 DIAGNOSIS — K529 Noninfective gastroenteritis and colitis, unspecified: Secondary | ICD-10-CM | POA: Diagnosis not present

## 2020-11-04 MED ORDER — COLCHICINE 0.6 MG PO TABS: ORAL_TABLET | ORAL | 0 refills | Status: DC

## 2020-11-04 MED ORDER — ALLOPURINOL 100 MG PO TABS: 100.0000 mg | ORAL_TABLET | Freq: Every day | ORAL | 6 refills | Status: DC

## 2020-11-04 NOTE — Progress Notes (Signed)
Established patient visit   Patient: Natasha Vasquez   DOB: 05/10/1940   80 y.o. Female  MRN: [id number] Visit Date: 11/04/2020  Today's healthcare provider: Lelon Huh, MD   Chief Complaint  Patient presents with  . Gout   Subjective    Diarrhea  This is a chronic problem. The problem has been waxing and waning. The patient states that diarrhea awakens her from sleep. Associated symptoms include abdominal pain. Pertinent negatives include no arthralgias, chills, fever, headaches, myalgias or vomiting. Exacerbated by: Usually happens after eating. There are no known risk factors. She has tried bismuth subsalicylate for the symptoms.   Episodes are random and intermittent. Describes them as explosive. May go days or weeks without episodes, but sometimes occurs several times in a day. Has been occurring for years. Had normal stool studies 04/07/2015. She had unremarkable colonoscopies in 2017 and in August of this year. Saw a PA at [facility]  in May and told to try IB Guard and FODMAT diet which did not help at all  Gout: She states she has been having recurring episodes of pain and swelling of the toes of her left foot, especially the great toe, for the last few years. She went to Urgent Care about a month ago and told she has gout and given prescription for 3 colchicine tablets which helped greatly, but pain has since started coming back. Has tried black cherry and tart cherry juice, but couldn't tell much difference.      Medications: Outpatient Medications Prior to Visit  Medication Sig  . acetaminophen (TYLENOL) 500 MG tablet Take 500 mg by mouth every 6 (six) hours as needed.  Marland Kitchen albuterol (PROVENTIL HFA;VENTOLIN HFA) 108 (90 Base) MCG/ACT inhaler Inhale 2 puffs into the lungs every 6 (six) hours as needed for wheezing or shortness of breath.  . ARIPiprazole (ABILIFY) 2 MG tablet TAKE 1 TABLET BY MOUTH EVERY EVENING.  Marland Kitchen aspirin 81 MG tablet Take 81 mg by mouth daily.  Marland Kitchen bismuth  subsalicylate (PEPTO BISMOL) 262 MG chewable tablet Chew 262 mg by mouth as needed. **capsule**  . Bismuth Subsalicylate 923 MG TABS Take 1 tablet by mouth as needed.  . calcium carbonate (TUMS - DOSED IN MG ELEMENTAL CALCIUM) 500 MG chewable tablet Chew 1 tablet by mouth as needed for indigestion or heartburn.  . clindamycin (CLEOCIN) 150 MG capsule Take 4 capsules by mouth as directed. 1 hour prior to dental procedure  . clobetasol ointment (TEMOVATE) 0.05 % APPLY TO AFFECTED AREAS AT NIGHT AS NEEDED  . clotrimazole (LOTRIMIN) 1 % cream Apply 1 application topically 2 (two) times daily.  . ferrous sulfate 324 MG TBEC Take 324 mg by mouth daily with breakfast.  . fluocinonide (LIDEX) 0.05 % external solution APPLY TO AFFECTED AREAS OF SCALP DAILY UNTIL CLEAR, THEN FOR FLARES  . lisinopril (ZESTRIL) 5 MG tablet TAKE 1 TABLET BY MOUTH EVERY DAY  . Multiple Vitamins-Minerals (CENTRUM SILVER PO) Take 1 tablet by mouth daily.  Marland Kitchen omeprazole (PRILOSEC) 40 MG capsule Take 1 capsule (40 mg total) by mouth 2 (two) times daily.  Marland Kitchen oxybutynin (DITROPAN-XL) 10 MG 24 hr tablet TAKE 1 TABLET BY MOUTH EVERY DAY  . rosuvastatin (CRESTOR) 10 MG tablet TAKE 1 TABLET BY MOUTH EVERY DAY  . Venlafaxine HCl 225 MG TB24 TAKE 1 TABLET BY MOUTH EVERY DAY  . atorvastatin (LIPITOR) 40 MG tablet TAKE 1 TABLET BY MOUTH EVERY DAY (Patient not taking: No sig reported)  No facility-administered medications prior to visit.    Review of Systems  Constitutional: Negative.  Negative for appetite change, chills, fatigue and fever.  Respiratory:  Negative for chest tightness and shortness of breath.   Cardiovascular:  Negative for chest pain and palpitations.  Gastrointestinal:  Positive for abdominal distention, abdominal pain and diarrhea. Negative for anal bleeding, blood in stool, constipation, nausea, rectal pain and vomiting.  Musculoskeletal:  Positive for gait problem and joint swelling. Negative for arthralgias, back  pain, myalgias, neck pain and neck stiffness.  Skin:  Negative for wound.  Neurological:  Negative for dizziness, weakness, light-headedness and headaches.      Objective    BP 137/87 (BP Location: Left Arm, Patient Position: Sitting, Cuff Size: Normal)   Pulse 77   Wt 184 lb (83.5 kg)   SpO2 98%   BMI 28.82 kg/m    Physical Exam   Exam:  Slight swelling and tenderness MTPs of right food. No erythema.   Assessment & Plan     1. Chronic diarrhea of unknown origin Ongoing for years. Normal stool studies in 2017. Normal colonoscopy August of this year at [facility]. She would like evaluation by a different gastroenterologist.  - Ambulatory referral to Gastroenterology  2. Chronic gout without tophus, unspecified cause, unspecified site Smoldering for year with significant but brief improvement with colchicine prescribed at Urgent Care last month. Last uric acid was in upper end of normal range.  Lab Results  Component Value Date   LABURIC 6.7 03/14/2018    - colchicine 0.6 MG tablet; Take two pills on first day, then on daily  Dispense: 30 tablet; Refill: 0 - allopurinol (ZYLOPRIM) 100 MG tablet; Take 1 tablet (100 mg total) by mouth daily.  Dispense: 30 tablet; Refill: 6         The entirety of the information documented in the History of Present Illness, Review of Systems and Physical Exam were personally obtained by me. Portions of this information were initially documented by the CMA and reviewed by me for thoroughness and accuracy.     Lelon Huh, MD  [facility] [phone] (phone) [phone] (fax)  [place]

## 2020-11-04 NOTE — Patient Instructions (Signed)
.   Please review the attached list of medications and notify my office if there are any errors.   . Please bring all of your medications to every appointment so we can make sure that our medication list is the same as yours.   

## 2020-11-09 DIAGNOSIS — Z85820 Personal history of malignant melanoma of skin: Secondary | ICD-10-CM | POA: Diagnosis not present

## 2020-11-09 DIAGNOSIS — L820 Inflamed seborrheic keratosis: Secondary | ICD-10-CM | POA: Diagnosis not present

## 2020-11-09 DIAGNOSIS — L538 Other specified erythematous conditions: Secondary | ICD-10-CM | POA: Diagnosis not present

## 2020-11-09 DIAGNOSIS — D2272 Melanocytic nevi of left lower limb, including hip: Secondary | ICD-10-CM | POA: Diagnosis not present

## 2020-11-09 DIAGNOSIS — Z85828 Personal history of other malignant neoplasm of skin: Secondary | ICD-10-CM | POA: Diagnosis not present

## 2020-11-09 DIAGNOSIS — Z86006 Personal history of melanoma in-situ: Secondary | ICD-10-CM | POA: Diagnosis not present

## 2020-11-09 DIAGNOSIS — L300 Nummular dermatitis: Secondary | ICD-10-CM | POA: Diagnosis not present

## 2020-11-29 ENCOUNTER — Other Ambulatory Visit: Payer: Self-pay | Admitting: Family Medicine

## 2020-11-29 DIAGNOSIS — M1A9XX0 Chronic gout, unspecified, without tophus (tophi): Secondary | ICD-10-CM

## 2020-11-29 NOTE — Telephone Encounter (Signed)
Requested medication (s) are due for refill today: due 12/04/20  Requested medication (s) are on the active medication list: yes   Last refill: 11/04/20 #30  0  refills  Future visit scheduled yes 03/21/21  Notes to clinic: Failed due to labs (Uric acid)  Please review. Thank you.  Requested Prescriptions  Pending Prescriptions Disp Refills   MITIGARE 0.6 MG CAPS [Pharmacy Med Name: MITIGARE 0.6 MG CAPSULE] 30 capsule     Sig: TAKE TWO PILLS ON FIRST DAY, THEN ONE DAILY     Endocrinology:  Gout Agents Failed - 11/29/2020  1:32 PM      Failed - Uric Acid in normal range and within 360 days    Uric Acid  Date Value Ref Range Status  03/14/2018 6.7 2.5 - 7.1 mg/dL Final    Comment:               Therapeutic target for gout patients: <6.0          Passed - Cr in normal range and within 360 days    Creatinine, Ser  Date Value Ref Range Status  03/25/2020 0.81 0.57 - 1.00 mg/dL Final          Passed - Valid encounter within last 12 months    Recent Outpatient Visits           3 weeks ago Chronic diarrhea of unknown origin   [facility] Birdie Sons, MD   8 months ago Encounter for annual physical exam   [employer] [facility], Dionne Bucy, MD   1 year ago Essential hypertension   [place], Dionne Bucy, MD   1 year ago Encounter for annual physical exam   [facility] [place], Dionne Bucy, MD   2 years ago Pulmonary emphysema, unspecified emphysema type [facility])   [place], Dionne Bucy, MD       Future Appointments             In 3 months Bacigalupo, Dionne Bucy, MD [facility], [place]

## 2020-12-12 DIAGNOSIS — Z9049 Acquired absence of other specified parts of digestive tract: Secondary | ICD-10-CM | POA: Diagnosis not present

## 2020-12-12 DIAGNOSIS — K591 Functional diarrhea: Secondary | ICD-10-CM | POA: Diagnosis not present

## 2020-12-12 DIAGNOSIS — Z800 Family history of malignant neoplasm of digestive organs: Secondary | ICD-10-CM | POA: Diagnosis not present

## 2020-12-13 ENCOUNTER — Other Ambulatory Visit: Payer: Self-pay | Admitting: Family Medicine

## 2020-12-14 ENCOUNTER — Ambulatory Visit: Payer: Medicare PPO | Admitting: Gastroenterology

## 2020-12-14 NOTE — Telephone Encounter (Signed)
Requested Prescriptions  Pending Prescriptions Disp Refills  . Venlafaxine HCl 225 MG TB24 [Pharmacy Med Name: VENLAFAXINE HCL ER 225 MG TAB] 90 tablet 0    Sig: TAKE 1 TABLET BY MOUTH EVERY DAY     Psychiatry: Antidepressants - SNRI - desvenlafaxine & venlafaxine Failed - 12/13/2020  1:27 AM      Failed - LDL in normal range and within 360 days    LDL Chol Calc (NIH)  Date Value Ref Range Status  03/25/2020 134 (H) 0 - 99 mg/dL Final         Failed - Total Cholesterol in normal range and within 360 days    Cholesterol, Total  Date Value Ref Range Status  03/25/2020 220 (H) 100 - 199 mg/dL Final         Failed - Triglycerides in normal range and within 360 days    Triglycerides  Date Value Ref Range Status  03/25/2020 259 (H) 0 - 149 mg/dL Final         Passed - Completed PHQ-2 or PHQ-9 in the last 360 days      Passed - Last BP in normal range    BP Readings from Last 1 Encounters:  11/04/20 137/87         Passed - Valid encounter within last 6 months    Recent Outpatient Visits          1 month ago Chronic diarrhea of unknown origin   [place], Kirstie Peri, MD   9 months ago Encounter for annual physical exam   [facility] [place], Dionne Bucy, MD   1 year ago Essential hypertension   [place], Dionne Bucy, MD   1 year ago Encounter for annual physical exam   [facility] [place], Dionne Bucy, MD   2 years ago Pulmonary emphysema, unspecified emphysema type [facility])   [place], Dionne Bucy, MD      Future Appointments            In 3 months Bacigalupo, Dionne Bucy, MD [facility], [place]

## 2021-01-12 ENCOUNTER — Other Ambulatory Visit: Payer: Self-pay | Admitting: Family Medicine

## 2021-01-12 DIAGNOSIS — I10 Essential (primary) hypertension: Secondary | ICD-10-CM

## 2021-01-12 NOTE — Telephone Encounter (Signed)
Requested Prescriptions  Pending Prescriptions Disp Refills   lisinopril (ZESTRIL) 5 MG tablet [Pharmacy Med Name: LISINOPRIL 5 MG TABLET] 90 tablet 0    Sig: TAKE 1 TABLET BY MOUTH EVERY DAY     Cardiovascular:  ACE Inhibitors Failed - 01/12/2021  9:11 AM      Failed - Cr in normal range and within 180 days    Creatinine, Ser  Date Value Ref Range Status  03/25/2020 0.81 0.57 - 1.00 mg/dL Final         Failed - K in normal range and within 180 days    Potassium  Date Value Ref Range Status  03/25/2020 4.9 3.5 - 5.2 mmol/L Final         Passed - Patient is not pregnant      Passed - Last BP in normal range    BP Readings from Last 1 Encounters:  11/04/20 137/87         Passed - Valid encounter within last 6 months    Recent Outpatient Visits          2 months ago Chronic diarrhea of unknown origin   [facility] Birdie Sons, MD   10 months ago Encounter for annual physical exam   [facility] [place], Dionne Bucy, MD   1 year ago Essential hypertension   [place], Dionne Bucy, MD   1 year ago Encounter for annual physical exam   [facility] [place], Dionne Bucy, MD   2 years ago Pulmonary emphysema, unspecified emphysema type [facility])   [facility] Bacigalupo, Dionne Bucy, MD      Future Appointments            In 2 months Bacigalupo, Dionne Bucy, MD [facility]

## 2021-01-18 ENCOUNTER — Other Ambulatory Visit: Payer: Self-pay | Admitting: *Deleted

## 2021-01-18 DIAGNOSIS — I7121 Aneurysm of the ascending aorta, without rupture: Secondary | ICD-10-CM

## 2021-01-25 IMAGING — CT CT CHEST W/ CM
2 of 4 series · 14 of 36 positions shown, 17 images · IV contrast (omnipaque)
Comparison: Chest CT 06/27/2018. CT the abdomen and pelvis
09/28/2004.

CLINICAL DATA: 78-year-old female with history of thoracic aortic
aneurysm. Follow-up study. Additional history of right-sided breast
cancer diagnosed in 4751 treated with mastectomy.

EXAM:
CT CHEST, ABDOMEN, AND PELVIS WITH CONTRAST
TECHNIQUE: Multidetector CT imaging of the chest, abdomen and pelvis was
performed following the standard protocol during bolus
administration of intravenous contrast.
CONTRAST:  100mL OMNIPAQUE IOHEXOL 350 MG/ML SOLN

[Series 2: axial chest · axial · 0.61mm/px · z∈[-1209,-945]mm · 11 of 158 slices shown, 14 images]
[im 13/158  mediastinal]
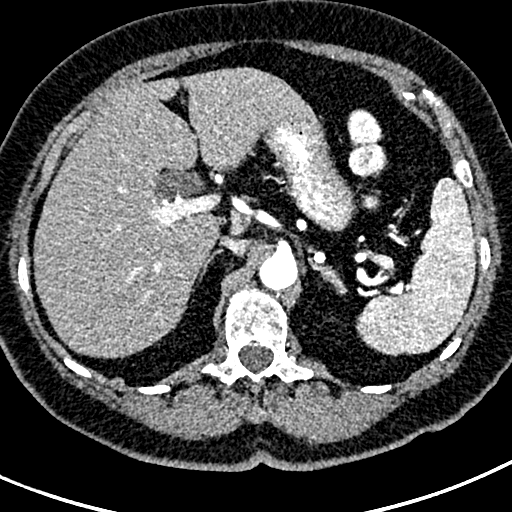
[im 13/158  lung]
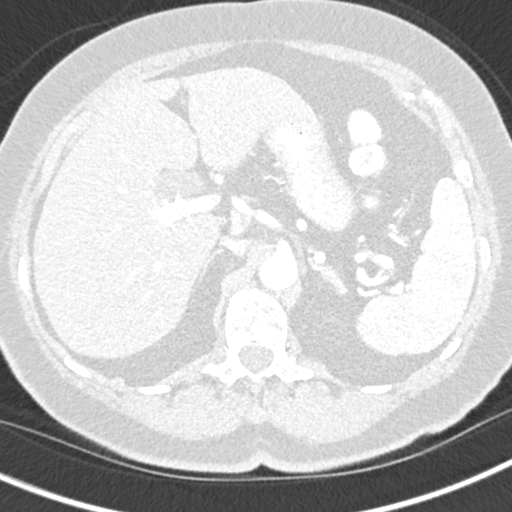
[im 25/158  lung]
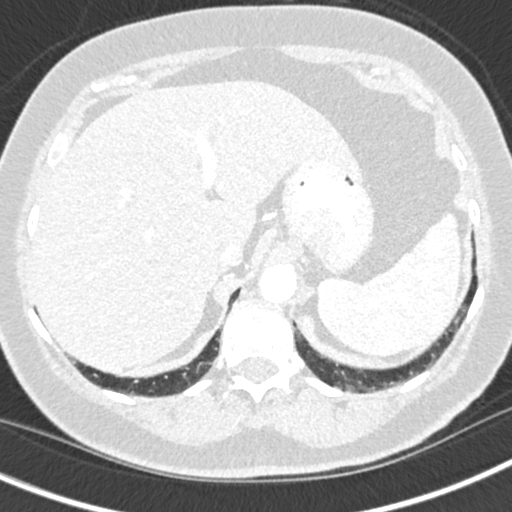
[im 37/158  lung]
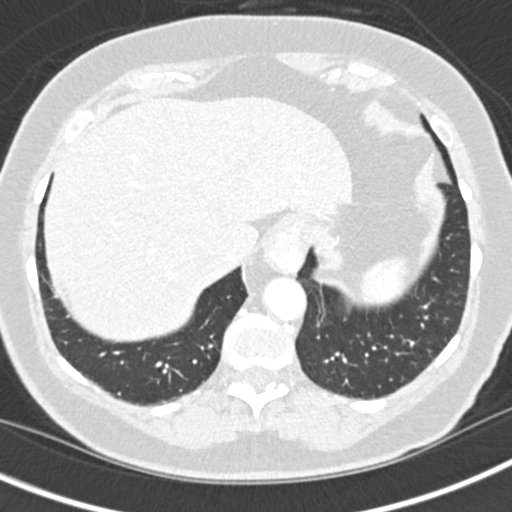
[im 49/158  lung]
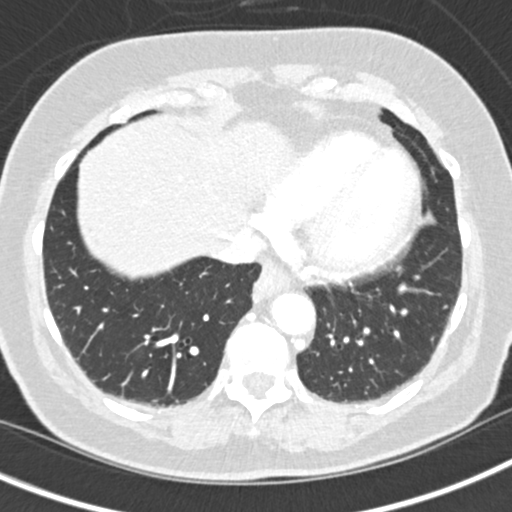
[im 61/158  mediastinal]
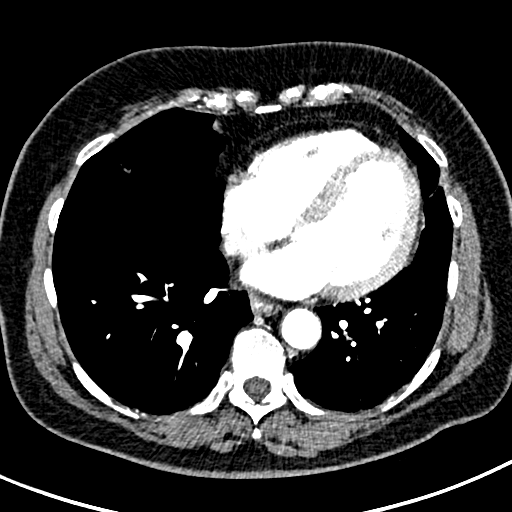
[im 61/158  lung]
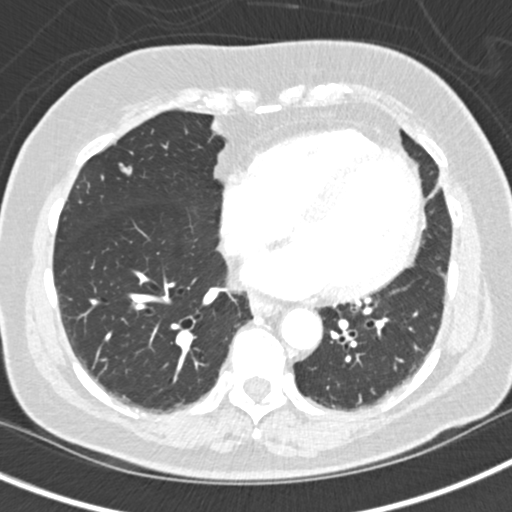
[im 85/158  lung]
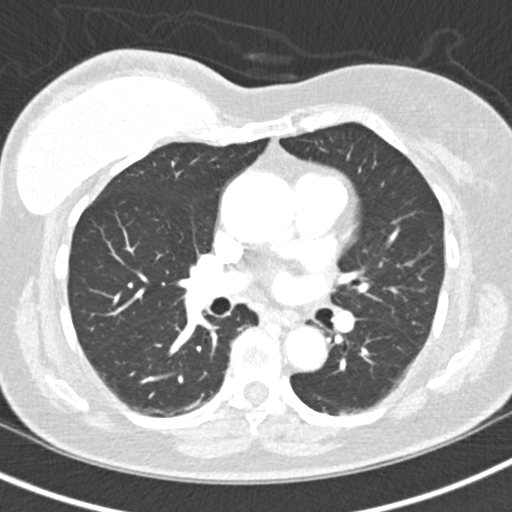
[im 97/158  lung]
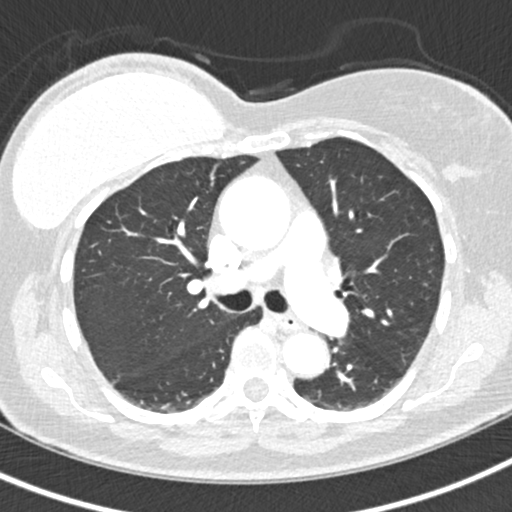
[im 109/158  lung]
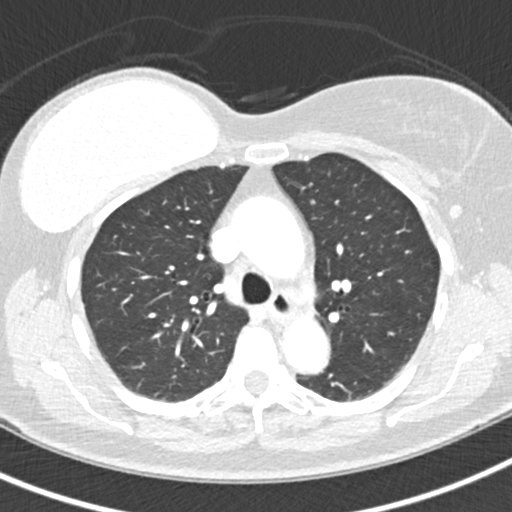
[im 121/158  mediastinal]
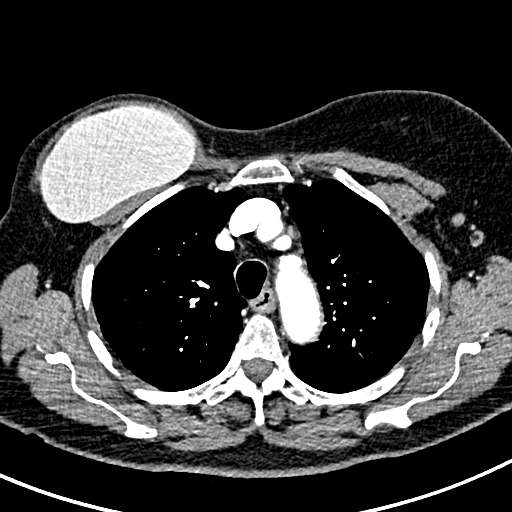
[im 121/158  lung]
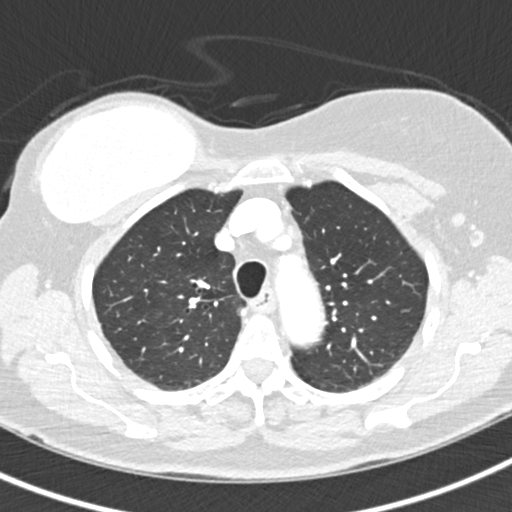
[im 133/158  lung]
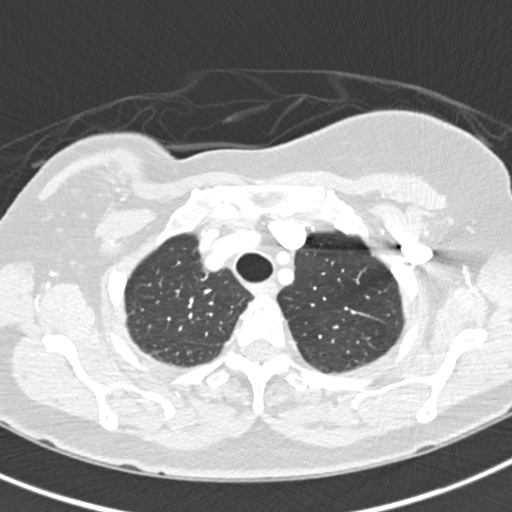
[im 145/158  lung]
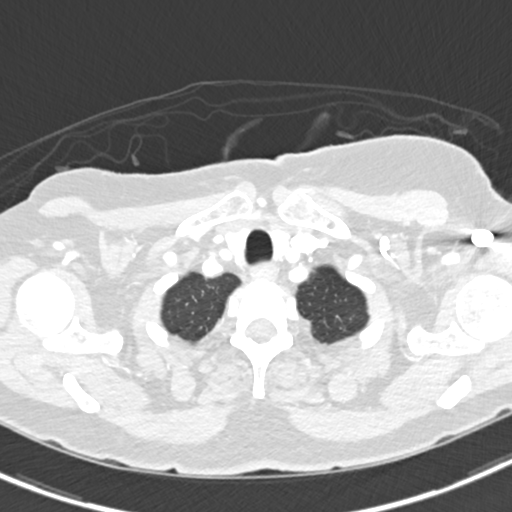

[Series 4: coronal chest · coronal · 0.61mm/px · 3 of 156 slices shown]
[im 32/156  lung]
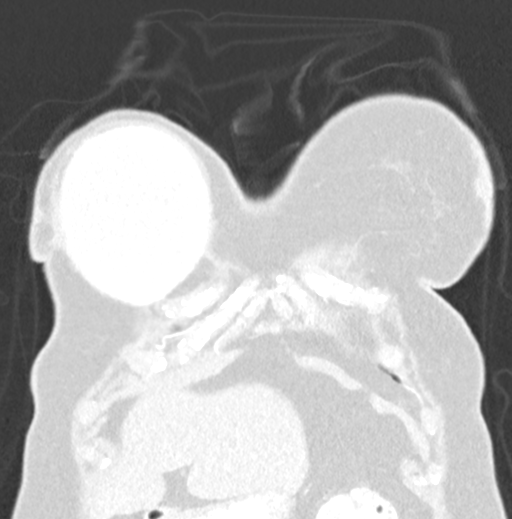
[im 63/156  lung]
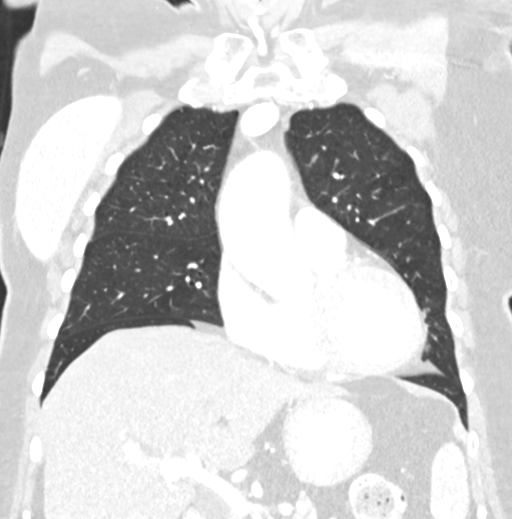
[im 94/156  lung]
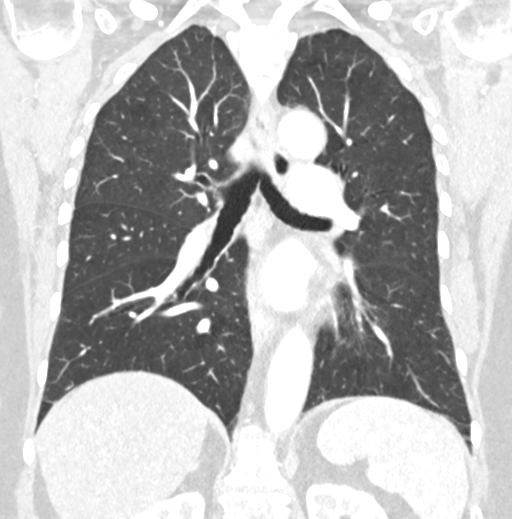

[14 of 36 positions shown; findings below may reference images not displayed]

FINDINGS: CT CHEST FINDINGS

Cardiovascular: Heart size is normal. There is no significant
pericardial fluid, thickening or pericardial calcification. There is
aortic atherosclerosis, as well as atherosclerosis of the great
vessels of the mediastinum and the coronary arteries, including
calcified atherosclerotic plaque in the left anterior descending
coronary artery. Ectasia of ascending thoracic aorta which measures
4.4 cm in diameter. No evidence of thoracic aortic dissection.

Mediastinum/Nodes: No pathologically enlarged mediastinal or hilar
lymph nodes. Small hiatal hernia. No axillary lymphadenopathy.

Lungs/Pleura: Several tiny 2-3 mm pulmonary nodules in the lungs
bilaterally, stable compared to the prior study, likely benign.
Branching density in the lateral segment of the right middle lobe
(axial image 98 of series 3), also stable, most compatible with an
area of mucoid impaction. No other larger suspicious appearing
pulmonary nodules or masses are noted. No acute consolidative
airspace disease. No pleural effusions.

Musculoskeletal: Status post right modified radical mastectomy with
subpectoral breast implant. There are no aggressive appearing lytic
or blastic lesions noted in the visualized portions of the skeleton.

CT ABDOMEN PELVIS FINDINGS

Hepatobiliary: No suspicious cystic or solid hepatic lesions. No
intrahepatic biliary ductal dilatation. Status post cholecystectomy.
Common bile duct measures 15 mm in the porta hepatis, likely
reflective of benign post cholecystectomy physiology, similar to the
prior study.

Pancreas: No pancreatic mass. No pancreatic ductal dilatation. No
pancreatic or peripancreatic fluid collections or inflammatory
changes.

Spleen: Unremarkable.

Adrenals/Urinary Tract: Bilateral kidneys and bilateral adrenal
glands are normal in appearance. No hydroureteronephrosis. Urinary
bladder is normal in appearance.

Stomach/Bowel: Normal appearance of the stomach. No pathologic
dilatation of small bowel or colon. Numerous colonic diverticulae
are noted, without surrounding inflammatory changes to suggest an
acute diverticulitis at this time. The appendix is not confidently
identified and may be surgically absent. Regardless, there are no
inflammatory changes noted adjacent to the cecum to suggest the
presence of an acute appendicitis at this time.

Vascular/Lymphatic: Aortic atherosclerosis, without evidence of
aneurysm or dissection in the abdominal or pelvic vasculature. No
lymphadenopathy noted in the abdomen or pelvis.

Reproductive: Status post hysterectomy. Ovaries are not confidently
identified may be surgically absent or atrophic.

Other: No significant volume of ascites.  No pneumoperitoneum.

Musculoskeletal: There are no aggressive appearing lytic or blastic
lesions noted in the visualized portions of the skeleton. Status
post right hip arthroplasty.
IMPRESSION: 1. Aortic atherosclerosis, in addition to left anterior descending
coronary artery disease. Ectasia of ascending thoracic aorta,
similar to the prior study measuring 4.4 cm in diameter on today's
examination. Recommend annual imaging followup by CTA or MRA. This
recommendation follows 5040
ACCF/AHA/AATS/ACR/ASA/SCA/ANJEL/FACHA/ULICES/JHONJANER Guidelines for the
Diagnosis and Management of Patients with Thoracic Aortic Disease.
Circulation. 5040; 121: [id number]. Aortic aneurysm NOS
([id number]).
2. Mild colonic diverticulosis without evidence of acute
diverticulitis at this time.
3. Additional incidental findings, as above.

## 2021-01-31 ENCOUNTER — Other Ambulatory Visit: Payer: Self-pay | Admitting: Family Medicine

## 2021-01-31 DIAGNOSIS — E785 Hyperlipidemia, unspecified: Secondary | ICD-10-CM

## 2021-01-31 DIAGNOSIS — I10 Essential (primary) hypertension: Secondary | ICD-10-CM

## 2021-01-31 DIAGNOSIS — E1169 Type 2 diabetes mellitus with other specified complication: Secondary | ICD-10-CM

## 2021-01-31 DIAGNOSIS — F4322 Adjustment disorder with anxiety: Secondary | ICD-10-CM

## 2021-01-31 DIAGNOSIS — N3281 Overactive bladder: Secondary | ICD-10-CM

## 2021-01-31 NOTE — Telephone Encounter (Signed)
Requested medication (s) are due for refill today: Rosuvastatin due 03/09/21  Lisinopril  04/12/21   Venlafaxine  due 2/30/23    Abilify due 03/09/20  Requested medication (s) are on the active medication list: yes  Last refill:   Future visit scheduled YEs  Notes to clinic: Failed due to labs that are due 03/25/21  Pt has appt 03/21/21.  Please review, thank you.  Requested Prescriptions  Pending Prescriptions Disp Refills   rosuvastatin (CRESTOR) 10 MG tablet [Pharmacy Med Name: ROSUVASTATIN CALCIUM 10 MG TAB] 90 tablet 1    Sig: TAKE 1 TABLET BY MOUTH EVERY DAY     Cardiovascular:  Antilipid - Statins Failed - 01/31/2021 11:23 AM      Failed - Total Cholesterol in normal range and within 360 days    Cholesterol, Total  Date Value Ref Range Status  03/25/2020 220 (H) 100 - 199 mg/dL Final          Failed - LDL in normal range and within 360 days    LDL Chol Calc (NIH)  Date Value Ref Range Status  03/25/2020 134 (H) 0 - 99 mg/dL Final          Failed - HDL in normal range and within 360 days    HDL  Date Value Ref Range Status  03/25/2020 39 (L) >39 mg/dL Final          Failed - Triglycerides in normal range and within 360 days    Triglycerides  Date Value Ref Range Status  03/25/2020 259 (H) 0 - 149 mg/dL Final          Passed - Patient is not pregnant      Passed - Valid encounter within last 12 months    Recent Outpatient Visits           2 months ago Chronic diarrhea of unknown origin   [facility], Kirstie Peri, MD   10 months ago Encounter for annual physical exam   [employer], Dionne Bucy, MD   1 year ago Essential hypertension   [place], Dionne Bucy, MD   1 year ago Encounter for annual physical exam   [facility] [place], Dionne Bucy, MD   2 years ago Pulmonary emphysema, unspecified emphysema type [facility])   [facility], Dionne Bucy, MD        Future Appointments             In 1 month Bacigalupo, Dionne Bucy, MD [facility]             lisinopril (ZESTRIL) 5 MG tablet [Pharmacy Med Name: LISINOPRIL 5 MG TABLET] 90 tablet 0    Sig: TAKE 1 TABLET BY MOUTH EVERY DAY     Cardiovascular:  ACE Inhibitors Failed - 01/31/2021 11:23 AM      Failed - Cr in normal range and within 180 days    Creatinine, Ser  Date Value Ref Range Status  03/25/2020 0.81 0.57 - 1.00 mg/dL Final          Failed - K in normal range and within 180 days    Potassium  Date Value Ref Range Status  03/25/2020 4.9 3.5 - 5.2 mmol/L Final          Passed - Patient is not pregnant      Passed - Last BP in normal range    BP Readings from Last 1 Encounters:  11/04/20 137/87  Passed - Valid encounter within last 6 months    Recent Outpatient Visits           2 months ago Chronic diarrhea of unknown origin   [facility] [place], Kirstie Peri, MD   10 months ago Encounter for annual physical exam   [facility] [place], Dionne Bucy, MD   1 year ago Essential hypertension   [facility] [place], Dionne Bucy, MD   1 year ago Encounter for annual physical exam   [facility], Dionne Bucy, MD   2 years ago Pulmonary emphysema, unspecified emphysema type [facility])   [facility], Dionne Bucy, MD       Future Appointments             In 1 month Bacigalupo, Dionne Bucy, MD [facility]             Venlafaxine HCl 225 MG TB24 [Pharmacy Med Name: VENLAFAXINE HCL ER 225 MG TAB] 90 tablet 0    Sig: TAKE 1 TABLET BY [place]     Psychiatry: Antidepressants - SNRI - desvenlafaxine & venlafaxine Failed - 01/31/2021 11:23 AM      Failed - LDL in normal range and within 360 days    LDL Chol Calc (NIH)  Date Value Ref Range Status  03/25/2020 134 (H) 0 - 99 mg/dL Final          Failed - Total Cholesterol in normal  range and within 360 days    Cholesterol, Total  Date Value Ref Range Status  03/25/2020 220 (H) 100 - 199 mg/dL Final          Failed - Triglycerides in normal range and within 360 days    Triglycerides  Date Value Ref Range Status  03/25/2020 259 (H) 0 - 149 mg/dL Final          Passed - Completed PHQ-2 or PHQ-9 in the last 360 days      Passed - Last BP in normal range    BP Readings from Last 1 Encounters:  11/04/20 137/87          Passed - Valid encounter within last 6 months    Recent Outpatient Visits           2 months ago Chronic diarrhea of unknown origin   [facility] Birdie Sons, MD   10 months ago Encounter for annual physical exam   [facility] [place], Dionne Bucy, MD   1 year ago Essential hypertension   [place], Dionne Bucy, MD   1 year ago Encounter for annual physical exam   [facility] [place], Dionne Bucy, MD   2 years ago Pulmonary emphysema, unspecified emphysema type [facility])   [place], Dionne Bucy, MD       Future Appointments             In 1 month Bacigalupo, Dionne Bucy, MD [facility], [facility]             ARIPiprazole (ABILIFY) 2 MG tablet [Pharmacy Med Name: ARIPIPRAZOLE 2 MG TABLET] 90 tablet 1    Sig: TAKE 1 TABLET BY MOUTH EVERY DAY IN THE EVENING     Not Delegated - Psychiatry:  Antipsychotics - Second Generation (Atypical) - aripiprazole Failed - 01/31/2021 11:23 AM      Failed - This refill cannot be delegated      Passed - Valid  encounter within last 6 months    Recent Outpatient Visits           2 months ago Chronic diarrhea of unknown origin   [facility] [place], Kirstie Peri, MD   10 months ago Encounter for annual physical exam   [facility], Dionne Bucy, MD   1 year ago Essential hypertension   [place], Dionne Bucy, MD   1 year ago Encounter  for annual physical exam   [facility], Dionne Bucy, MD   2 years ago Pulmonary emphysema, unspecified emphysema type [facility])   [place], Dionne Bucy, MD       Future Appointments             In 1 month Bacigalupo, Dionne Bucy, MD [facility], [facility]            Signed Prescriptions Disp Refills   oxybutynin (DITROPAN-XL) 10 MG 24 hr tablet 90 tablet 0    Sig: TAKE 1 TABLET BY [place]     Urology:  Bladder Agents Passed - 01/31/2021 11:23 AM      Passed - Valid encounter within last 12 months    Recent Outpatient Visits           2 months ago Chronic diarrhea of unknown origin   [place], Kirstie Peri, MD   10 months ago Encounter for annual physical exam   [facility] [place], Dionne Bucy, MD   1 year ago Essential hypertension   [place], Dionne Bucy, MD   1 year ago Encounter for annual physical exam   [facility], Dionne Bucy, MD   2 years ago Pulmonary emphysema, unspecified emphysema type [facility])   [place], Dionne Bucy, MD       Future Appointments             In 1 month Bacigalupo, Dionne Bucy, MD [facility], [place]

## 2021-01-31 NOTE — Telephone Encounter (Signed)
Requested Prescriptions  Pending Prescriptions Disp Refills   rosuvastatin (CRESTOR) 10 MG tablet [Pharmacy Med Name: ROSUVASTATIN CALCIUM 10 MG TAB] 90 tablet 1    Sig: TAKE 1 TABLET BY MOUTH EVERY DAY     Cardiovascular:  Antilipid - Statins Failed - 01/31/2021 11:23 AM      Failed - Total Cholesterol in normal range and within 360 days    Cholesterol, Total  Date Value Ref Range Status  03/25/2020 220 (H) 100 - 199 mg/dL Final         Failed - LDL in normal range and within 360 days    LDL Chol Calc (NIH)  Date Value Ref Range Status  03/25/2020 134 (H) 0 - 99 mg/dL Final         Failed - HDL in normal range and within 360 days    HDL  Date Value Ref Range Status  03/25/2020 39 (L) >39 mg/dL Final         Failed - Triglycerides in normal range and within 360 days    Triglycerides  Date Value Ref Range Status  03/25/2020 259 (H) 0 - 149 mg/dL Final         Passed - Patient is not pregnant      Passed - Valid encounter within last 12 months    Recent Outpatient Visits          2 months ago Chronic diarrhea of unknown origin   [facility], Kirstie Peri, MD   10 months ago Encounter for annual physical exam   [employer], Dionne Bucy, MD   1 year ago Essential hypertension   [place], Dionne Bucy, MD   1 year ago Encounter for annual physical exam   [facility], Dionne Bucy, MD   2 years ago Pulmonary emphysema, unspecified emphysema type [facility])   [facility], Dionne Bucy, MD      Future Appointments            In 1 month Bacigalupo, Dionne Bucy, MD [facility], [facility]            lisinopril (ZESTRIL) 5 MG tablet [Pharmacy Med Name: LISINOPRIL 5 MG TABLET] 90 tablet 0    Sig: TAKE 1 TABLET BY MOUTH EVERY DAY     Cardiovascular:  ACE Inhibitors Failed - 01/31/2021 11:23 AM      Failed - Cr in normal range and within 180 days    Creatinine,  Ser  Date Value Ref Range Status  03/25/2020 0.81 0.57 - 1.00 mg/dL Final         Failed - K in normal range and within 180 days    Potassium  Date Value Ref Range Status  03/25/2020 4.9 3.5 - 5.2 mmol/L Final         Passed - Patient is not pregnant      Passed - Last BP in normal range    BP Readings from Last 1 Encounters:  11/04/20 137/87         Passed - Valid encounter within last 6 months    Recent Outpatient Visits          2 months ago Chronic diarrhea of unknown origin   [facility] Birdie Sons, MD   10 months ago Encounter for annual physical exam   [facility] [place], Dionne Bucy, MD   1 year ago Essential hypertension   [facility] [place], [place]  M, MD   1 year ago Encounter for annual physical exam   [facility] [place], Dionne Bucy, MD   2 years ago Pulmonary emphysema, unspecified emphysema type [facility] [place])   [facility], Dionne Bucy, MD      Future Appointments            In 1 month Bacigalupo, Dionne Bucy, MD [facility]            Venlafaxine HCl 225 MG TB24 [Pharmacy Med Name: VENLAFAXINE HCL ER 225 MG TAB] 90 tablet 0    Sig: TAKE 1 TABLET BY MOUTH EVERY DAY     Psychiatry: Antidepressants - SNRI - desvenlafaxine & venlafaxine Failed - 01/31/2021 11:23 AM      Failed - LDL in normal range and within 360 days    LDL Chol Calc (NIH)  Date Value Ref Range Status  03/25/2020 134 (H) 0 - 99 mg/dL Final         Failed - Total Cholesterol in normal range and within 360 days    Cholesterol, Total  Date Value Ref Range Status  03/25/2020 220 (H) 100 - 199 mg/dL Final         Failed - Triglycerides in normal range and within 360 days    Triglycerides  Date Value Ref Range Status  03/25/2020 259 (H) 0 - 149 mg/dL Final         Passed - Completed PHQ-2 or PHQ-9 in the last 360 days      Passed - Last BP in normal range    BP Readings from  Last 1 Encounters:  11/04/20 137/87         Passed - Valid encounter within last 6 months    Recent Outpatient Visits          2 months ago Chronic diarrhea of unknown origin   [facility] Birdie Sons, MD   10 months ago Encounter for annual physical exam   [facility] [place], Dionne Bucy, MD   1 year ago Essential hypertension   [place], Dionne Bucy, MD   1 year ago Encounter for annual physical exam   [facility], Dionne Bucy, MD   2 years ago Pulmonary emphysema, unspecified emphysema type [facility])   [place], Dionne Bucy, MD      Future Appointments            In 1 month Bacigalupo, Dionne Bucy, MD [facility]            oxybutynin (DITROPAN-XL) 10 MG 24 hr tablet [Pharmacy Med Name: OXYBUTYNIN CL ER 10 MG TABLET] 90 tablet 1    Sig: TAKE 1 TABLET BY MOUTH EVERY DAY     Urology:  Bladder Agents Passed - 01/31/2021 11:23 AM      Passed - Valid encounter within last 12 months    Recent Outpatient Visits          2 months ago Chronic diarrhea of unknown origin   [place], Kirstie Peri, MD   10 months ago Encounter for annual physical exam   [facility] [place], Dionne Bucy, MD   1 year ago Essential hypertension   [place], Dionne Bucy, MD   1 year ago Encounter for annual physical exam   [facility] [place], MD   2 years ago Pulmonary emphysema, unspecified emphysema type ([place])  [facility] Bacigal[place], Dionne Bucy, MD      Future Appointments            In 1 month Bacigalupo, Dionne Bucy, MD [facility], [facility]            ARIPiprazole (ABILIFY) 2 MG tablet [Pharmacy Med Name: ARIPIPRAZOLE 2 MG TABLET] 90 tablet 1    Sig: TAKE 1 TABLET BY MOUTH EVERY DAY IN THE EVENING     Not Delegated - Psychiatry:  Antipsychotics -  Second Generation (Atypical) - aripiprazole Failed - 01/31/2021 11:23 AM      Failed - This refill cannot be delegated      Passed - Valid encounter within last 6 months    Recent Outpatient Visits          2 months ago Chronic diarrhea of unknown origin   [place], Kirstie Peri, MD   10 months ago Encounter for annual physical exam   [facility] [place], Dionne Bucy, MD   1 year ago Essential hypertension   [place], Dionne Bucy, MD   1 year ago Encounter for annual physical exam   [facility] [place], Dionne Bucy, MD   2 years ago Pulmonary emphysema, unspecified emphysema type [facility])   [facility] Bacigal[place], Dionne Bucy, MD      Future Appointments            In 1 month Bacigalupo, Dionne Bucy, MD [facility] [place] with pharmacy regarding Oxybutin. Stated no refills available.

## 2021-02-20 ENCOUNTER — Other Ambulatory Visit: Payer: Self-pay | Admitting: *Deleted

## 2021-02-20 DIAGNOSIS — D500 Iron deficiency anemia secondary to blood loss (chronic): Secondary | ICD-10-CM

## 2021-02-21 ENCOUNTER — Inpatient Hospital Stay: Admission: RE | Admit: 2021-02-21 | Payer: Medicare PPO | Source: Ambulatory Visit

## 2021-02-21 ENCOUNTER — Encounter: Payer: Medicare PPO | Admitting: Thoracic Surgery (Cardiothoracic Vascular Surgery)

## 2021-03-03 ENCOUNTER — Inpatient Hospital Stay: Payer: Medicare PPO

## 2021-03-03 ENCOUNTER — Inpatient Hospital Stay: Payer: Medicare PPO | Attending: Internal Medicine

## 2021-03-03 ENCOUNTER — Encounter: Payer: Self-pay | Admitting: Internal Medicine

## 2021-03-03 ENCOUNTER — Other Ambulatory Visit: Payer: Self-pay

## 2021-03-03 ENCOUNTER — Inpatient Hospital Stay (HOSPITAL_BASED_OUTPATIENT_CLINIC_OR_DEPARTMENT_OTHER): Payer: Medicare PPO | Admitting: Internal Medicine

## 2021-03-03 DIAGNOSIS — Z79899 Other long term (current) drug therapy: Secondary | ICD-10-CM | POA: Diagnosis not present

## 2021-03-03 DIAGNOSIS — E611 Iron deficiency: Secondary | ICD-10-CM

## 2021-03-03 DIAGNOSIS — D509 Iron deficiency anemia, unspecified: Secondary | ICD-10-CM | POA: Diagnosis not present

## 2021-03-03 DIAGNOSIS — Z87891 Personal history of nicotine dependence: Secondary | ICD-10-CM | POA: Insufficient documentation

## 2021-03-03 DIAGNOSIS — Z7982 Long term (current) use of aspirin: Secondary | ICD-10-CM | POA: Diagnosis not present

## 2021-03-03 DIAGNOSIS — D500 Iron deficiency anemia secondary to blood loss (chronic): Secondary | ICD-10-CM

## 2021-03-03 DIAGNOSIS — R197 Diarrhea, unspecified: Secondary | ICD-10-CM | POA: Insufficient documentation

## 2021-03-03 LAB — CBC WITH DIFFERENTIAL/PLATELET
Abs Immature Granulocytes: 0.08 10*3/uL — ABNORMAL HIGH (ref 0.00–0.07)
Basophils Absolute: 0.1 10*3/uL (ref 0.0–0.1)
Basophils Relative: 1 %
Eosinophils Absolute: 0.3 10*3/uL (ref 0.0–0.5)
Eosinophils Relative: 3 %
HCT: 39.6 % (ref 36.0–46.0)
Hemoglobin: 12.7 g/dL (ref 12.0–15.0)
Immature Granulocytes: 1 %
Lymphocytes Relative: 24 %
Lymphs Abs: 1.9 10*3/uL (ref 0.7–4.0)
MCH: 28 pg (ref 26.0–34.0)
MCHC: 32.1 g/dL (ref 30.0–36.0)
MCV: 87.4 fL (ref 80.0–100.0)
Monocytes Absolute: 0.5 10*3/uL (ref 0.1–1.0)
Monocytes Relative: 6 %
Neutro Abs: 5.2 10*3/uL (ref 1.7–7.7)
Neutrophils Relative %: 65 %
Platelets: 350 10*3/uL (ref 150–400)
RBC: 4.53 MIL/uL (ref 3.87–5.11)
RDW: 15.2 % (ref 11.5–15.5)
WBC: 8 10*3/uL (ref 4.0–10.5)
nRBC: 0 % (ref 0.0–0.2)

## 2021-03-03 NOTE — Progress Notes (Signed)
[place] NOTE  Patient Care Team: Brita Romp Dionne Bucy, MD as PCP - General (Family Medicine) Watt Climes, PA as Physician Assistant (Physician Assistant) Marry Guan, Laurice Record, MD as Consulting Physician (Orthopedic Surgery) Bary Castilla, Forest Gleason, MD (General Surgery) Dasher, Rayvon Char, MD (Dermatology) Grace Isaac, MD (Inactive) as Consulting Physician (Cardiothoracic Surgery) Cammie Sickle, MD as Consulting Physician (Internal Medicine) Leandrew Koyanagi, MD as Referring Physician (Ophthalmology)  CHIEF COMPLAINTS/PURPOSE OF CONSULTATION: Anemia  HEMATOLOGY HISTORY  # ANEMIA EGD/ colonoscopy- 2017 [Dr.Elliot]-August 2020-hemoglobin 10.4 MCV 78; iron saturation 10% ferritin 44; zinc copper/LDH heptoglobin-Normal. CT-C/A-P-NEG.   # Thoracic Aneurysm [4.4cm]- surveillaince  HISTORY OF PRESENTING ILLNESS: Ambulating independently.  Alone.  Natasha Vasquez 81 y.o.  female with iron deficient anemia-unclear etiology is here for follow-up.  Status post colonoscopy with Dr. Alice Reichert; status post "antibiotics"-diarrhea has improved.  She continues to be on oral iron.  Denies any blood in stool.  Review of Systems  Constitutional:  Negative for chills, diaphoresis, fever and weight loss.  HENT:  Negative for nosebleeds and sore throat.   Eyes:  Negative for double vision.  Respiratory:  Negative for cough, hemoptysis, sputum production, shortness of breath and wheezing.   Cardiovascular:  Negative for chest pain, palpitations, orthopnea and leg swelling.  Gastrointestinal:  Positive for diarrhea. Negative for abdominal pain, blood in stool, constipation, heartburn, melena, nausea and vomiting.  Genitourinary:  Negative for dysuria, frequency and urgency.  Musculoskeletal:  Positive for back pain and joint pain.  Skin: Negative.  Negative for itching and rash.  Neurological:  Negative for dizziness, tingling, focal weakness, weakness and headaches.   Endo/Heme/Allergies:  Does not bruise/bleed easily.  Psychiatric/Behavioral:  Negative for depression. The patient is not nervous/anxious and does not have insomnia.    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  ER/PR NEGATIVE   Cataract    Chronic cough    Complication of anesthesia    20 years ago pts b/p dropped really low, "coded"   Depression    Depression    Diabetes mellitus without complication (HCC)    diet controlled   Family history of adverse reaction to anesthesia    son PONV   GERD (gastroesophageal reflux disease)    Gout    Hypercholesteremia    Hyperlipidemia    Hypertension    Melanoma ([place])    MVP (mitral valve prolapse)    followed by PCP   Nephritis    Nephritis    S/P appendectomy    Thoracic aortic aneurysm    Torn meniscus    left    SURGICAL HISTORY: Past Surgical History:  Procedure Laterality Date   ABDOMINAL HYSTERECTOMY     APPENDECTOMY     BREAST BIOPSY Right 04/25/2017   retroareolar 10:00   wing clip   path pending   BREAST BIOPSY Right 04/25/2017   10:00  5CMFN  venus clip    path pending   BREAST BIOPSY Right 05/03/2017   Affirm Bx- path pending   BREAST CYST ASPIRATION Bilateral    NEG   BREAST EXCISIONAL BIOPSY Left 20+ yrs ago   NEG   BREAST RECONSTRUCTION WITH PLACEMENT OF TISSUE EXPANDER AND FLEX HD (ACELLULAR HYDRATED DERMIS) Right 06/03/2017   Procedure: BREAST RECONSTRUCTION  WITH PLACEMENT OF TISSUE EXPANDER AND FLEX HD (ACELLULAR HYDRATED DERMIS);  Surgeon: Wallace Going, DO;  Location: [facility];  Service: Plastics;  Laterality: Right;   BREAST REDUCTION SURGERY Left 11/06/2017   Procedure: BREAST REDUCTION;  Surgeon: Wallace Going, DO;  Location: [facility];  Service: Plastics;  Laterality: Left;   BROW LIFT Bilateral 02/01/2015    Procedure: BLEPHAROPLASTY bilateral upper eyelids.;  Surgeon: Karle Starch, MD;  Location: [place];  Service: Ophthalmology;  Laterality: Bilateral;  DIABETIC - diet controlled   CATARACT EXTRACTION W/PHACO Left 01/14/2019   Procedure: CATARACT EXTRACTION PHACO AND INTRAOCULAR LENS PLACEMENT (IOC) LEFT TECNIS TORIC ADD 5.62 00:46.5 30.0%;  Surgeon: Leandrew Koyanagi, MD;  Location: [place];  Service: Ophthalmology;  Laterality: Left;  diabetic - diet controlled   CATARACT EXTRACTION W/PHACO Right 02/11/2019   Procedure: CATARACT EXTRACTION PHACO AND INTRAOCULAR LENS PLACEMENT (IOC) RIGHT DIABETIC 4.82 00:46.9 10.3%;  Surgeon: Leandrew Koyanagi, MD;  Location: [place];  Service: Ophthalmology;  Laterality: Right;  Diabetic - diet controlled   CHOLECYSTECTOMY     COLONOSCOPY     COLONOSCOPY WITH PROPOFOL N/A 05/11/2015   Procedure: COLONOSCOPY WITH PROPOFOL;  Surgeon: Manya Silvas, MD;  Location: [facility];  Service: Endoscopy;  Laterality: N/A;   COLONOSCOPY WITH PROPOFOL N/A 09/07/2020   Procedure: COLONOSCOPY WITH PROPOFOL;  Surgeon: Toledo, Benay Pike, MD;  Location: [facility];  Service: Gastroenterology;  Laterality: N/A;   ESOPHAGOGASTRODUODENOSCOPY     ESOPHAGOGASTRODUODENOSCOPY (EGD) WITH PROPOFOL  05/11/2015   Procedure: ESOPHAGOGASTRODUODENOSCOPY (EGD) WITH PROPOFOL;  Surgeon: Manya Silvas, MD;  Location: [facility];  Service: Endoscopy;;   FRACTURE SURGERY Right    arm and shoulder   JOINT REPLACEMENT     KNEE ARTHROSCOPY Left 03/14/2016   Procedure: ARTHROSCOPY KNEE, PARTIAL MEDIAL MENISECTOMY, CHONDROPLASTY;  Surgeon: Dereck Leep, MD;  Location: [facility];  Service: Orthopedics;  Laterality: Left;   MASTECTOMY Right 2019   MASTECTOMY W/ SENTINEL NODE BIOPSY Right 06/03/2017   Procedure: MASTECTOMY WITH SENTINEL LYMPH NODE BIOPSY;  Surgeon: Robert Bellow, MD;  Location: [facility];  Service: General;  Laterality: Right;    PARTIAL HIP ARTHROPLASTY Right    REDUCTION MAMMAPLASTY Left 2019   REMOVAL OF BILATERAL TISSUE EXPANDERS WITH PLACEMENT OF BILATERAL BREAST IMPLANTS Right 11/06/2017   Procedure: REMOVAL OF TISSUE EXPANDER WITH PLACEMENT OF BREAST IMPLANT;  Surgeon: Wallace Going, DO;  Location: [facility];  Service: Plastics;  Laterality: Right;   TONSILLECTOMY      SOCIAL HISTORY: Social History   Socioeconomic History   Marital status: Married    Spouse name: Sonia Side   Number of children: 2   Years of education: Not on file   Highest education level: Master's degree (e.g., MA, MS, MEng, MEd, MSW, MBA)  Occupational History   Occupation: retired  Tobacco Use   Smoking status: Former    Types: Cigarettes    Quit date: 1965    Years since quitting: 58.1   Smokeless tobacco: Never   Tobacco comments:    smoked in college  Vaping Use   Vaping Use: Never used  Substance and Sexual Activity   Alcohol use: Yes    Alcohol/week: 7.0 - 14.0 standard drinks    Types: 7 - 14 Glasses of wine per week    Comment: 1-2 glasses of wine per night   Drug use: No   Sexual activity: Never  Other Topics Concern   Not on file  Social  History Narrative   [place]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    Social Determinants of Health   Financial Resource Strain: Low Risk    Difficulty of Paying Living Expenses: Not hard at all  Food Insecurity: No Food Insecurity   Worried About Charity fundraiser in the Last Year: Never true   [place] in the Last Year: Never true  Transportation Needs: No Transportation Needs   Lack of Transportation (Medical): No   Lack of Transportation (Non-Medical): No  Physical Activity: Inactive   Days of Exercise per Week: 0 days   Minutes of Exercise per Session: 0 min  Stress: No Stress Concern Present   Feeling of Stress : Not at all  Social Connections: Socially Integrated   Frequency of Communication with Friends  and Family: More than three times a week   Frequency of Social Gatherings with Friends and Family: More than three times a week   Attends Religious Services: More than 4 times per year   Active Member of [employer] or Organizations: Yes   Attends Music therapist: More than 4 times per year   Marital Status: Married  Human resources officer Violence: Not At Risk   Fear of Current or Ex-Partner: No   Emotionally Abused: No   Physically Abused: No   Sexually Abused: No    FAMILY HISTORY: Family History  Problem Relation Age of Onset   Cancer Mother        colon/rectal   Colon cancer Mother    Heart disease Father    Hypertension Father    Atrial fibrillation Father    Diabetes Father    Cancer Brother        lung   Healthy Son    Healthy Son    Breast cancer Neg Hx     ALLERGIES:  is allergic to codeine, lorazepam, oxycodone, paregoric, penicillins, quinolones, and aleve [naproxen sodium].  MEDICATIONS:  Current Outpatient Medications  Medication Sig Dispense Refill   acetaminophen (TYLENOL) 500 MG tablet Take 500 mg by mouth every 6 (six) hours as needed.     albuterol (PROVENTIL HFA;VENTOLIN HFA) 108 (90 Base) MCG/ACT inhaler Inhale 2 puffs into the lungs every 6 (six) hours as needed for wheezing or shortness of breath. 1 Inhaler 2   allopurinol (ZYLOPRIM) 100 MG tablet Take 1 tablet (100 mg total) by mouth daily. 30 tablet 6   ARIPiprazole (ABILIFY) 2 MG tablet TAKE 1 TABLET BY MOUTH EVERY DAY IN THE EVENING 90 tablet 0   aspirin 81 MG tablet Take 81 mg by mouth daily.     bismuth subsalicylate (PEPTO BISMOL) 262 MG chewable tablet Chew 262 mg by mouth as needed. **capsule**     calcium carbonate (TUMS - DOSED IN MG ELEMENTAL CALCIUM) 500 MG chewable tablet Chew 1 tablet by mouth as needed for indigestion or heartburn.     clindamycin (CLEOCIN) 150 MG capsule Take 4 capsules by mouth as directed. 1 hour prior to dental procedure     clobetasol ointment (TEMOVATE) 0.05 %  APPLY TO AFFECTED AREAS AT NIGHT AS NEEDED     clotrimazole (LOTRIMIN) 1 % cream Apply 1 application topically 2 (two) times daily. 30 g 1   colestipol (COLESTID) 1 g tablet Take 1 g by mouth 2 (two) times daily.     ferrous sulfate 324 MG TBEC Take 324 mg by mouth daily with breakfast.     fluocinonide (LIDEX) 0.05 %  external solution APPLY TO AFFECTED AREAS OF SCALP DAILY UNTIL CLEAR, THEN FOR FLARES     lisinopril (ZESTRIL) 5 MG tablet TAKE 1 TABLET BY MOUTH EVERY DAY 90 tablet 0   Multiple Vitamins-Minerals (CENTRUM SILVER PO) Take 1 tablet by mouth daily.     omeprazole (PRILOSEC) 40 MG capsule Take 1 capsule (40 mg total) by mouth 2 (two) times daily. 180 capsule 1   oxybutynin (DITROPAN-XL) 10 MG 24 hr tablet TAKE 1 TABLET BY MOUTH EVERY DAY 90 tablet 0   rosuvastatin (CRESTOR) 10 MG tablet TAKE 1 TABLET BY MOUTH EVERY DAY 90 tablet 0   Venlafaxine HCl 225 MG TB24 TAKE 1 TABLET BY MOUTH EVERY DAY 90 tablet 0   Bismuth Subsalicylate 161 MG TABS Take 1 tablet by mouth as needed. (Patient not taking: Reported on 03/03/2021)     MITIGARE 0.6 MG CAPS TAKE TWO PILLS ON FIRST DAY, THEN ONE DAILY (Patient not taking: Reported on 03/03/2021) 30 capsule 2   No current facility-administered medications for this visit.   PHYSICAL EXAMINATION:   Vitals:   03/03/21 1300  BP: 135/81  Pulse: 88  Resp: 18  Temp: 99.4 F (37.4 C)  SpO2: 94%   Filed Weights   03/03/21 1300  Weight: 189 lb (85.7 kg)    Physical Exam HENT:     Head: Normocephalic and atraumatic.     Mouth/Throat:     Pharynx: No oropharyngeal exudate.  Eyes:     Pupils: Pupils are equal, round, and reactive to light.  Cardiovascular:     Rate and Rhythm: Normal rate and regular rhythm.  Pulmonary:     Effort: No respiratory distress.     Breath sounds: No wheezing.  Abdominal:     General: Bowel sounds are normal. There is no distension.     Palpations: Abdomen is soft. There is no mass.     Tenderness: There is no  abdominal tenderness. There is no guarding or rebound.  Musculoskeletal:        General: No tenderness. Normal range of motion.     Cervical back: Normal range of motion and neck supple.  Skin:    General: Skin is warm.  Neurological:     Mental Status: She is alert and oriented to person, place, and time.  Psychiatric:        Mood and Affect: Affect normal.    LABORATORY DATA:  I have reviewed the data as listed Lab Results  Component Value Date   WBC 8.0 03/03/2021   HGB 12.7 03/03/2021   HCT 39.6 03/03/2021   MCV 87.4 03/03/2021   PLT 350 03/03/2021   Recent Labs    03/25/20 0829  NA 143  K 4.9  CL 104  CO2 22  GLUCOSE 112*  BUN 15  CREATININE 0.81  CALCIUM 9.5  PROT 7.0  ALBUMIN 4.5  AST 18  ALT 57*  ALKPHOS 104  BILITOT 0.2     No results found.  Iron deficiency #Iron deficient anemia-unclear etiology.  S/p Venofer-symptoms of fatigue improved; August 2022- hemoglobin is 12.6; iron saturation 20 ferritin greater than 70.  #Hold Venofer today.  Continue p.o. iron.  # Chronic diarrhea- [1-4/day]; s/p  colonoscopy with Dr. Alice Reichert.  #Since patient is clinically stable I think is reasonable for the patient to follow-up with PCP/can follow-up with [place] as needed.  Patient comfortable with the plan; to call us if any questions or concerns in the interim.  # DISPOSITION: # No venofer  #  follow up as needed- Dr.B   All questions were answered. The patient knows to call the clinic with any problems, questions or concerns.    Cammie Sickle, MD 03/03/2021 1:56 PM

## 2021-03-03 NOTE — Progress Notes (Signed)
Patient has a decrease in energy and increased dyspnea on exertion.

## 2021-03-03 NOTE — Assessment & Plan Note (Signed)
#  Iron deficient anemia-unclear etiology.  S/p Venofer-symptoms of fatigue improved; August 2022- hemoglobin is 12.6; iron saturation 20 ferritin greater than 70.  #Hold Venofer today.  Continue p.o. iron.  # Chronic diarrhea- [1-4/day]; s/p  colonoscopy with Dr. Alice Reichert.  #Since patient is clinically stable I think is reasonable for the patient to follow-up with PCP/can follow-up with [place] as needed.  Patient comfortable with the plan; to call us if any questions or concerns in the interim.  # DISPOSITION: # No venofer  # follow up as needed- Dr.B

## 2021-03-04 LAB — FERRITIN: Ferritin: 102 ng/mL (ref 11–307)

## 2021-03-04 LAB — IRON AND TIBC
Iron: 95 ug/dL (ref 28–170)
Saturation Ratios: 28 % (ref 10.4–31.8)
TIBC: 343 ug/dL (ref 250–450)
UIBC: 248 ug/dL

## 2021-03-07 ENCOUNTER — Ambulatory Visit
Admission: RE | Admit: 2021-03-07 | Discharge: 2021-03-07 | Disposition: A | Discharge status: Home / Self Care | Payer: Medicare PPO | Source: Ambulatory Visit | Attending: Thoracic Surgery (Cardiothoracic Vascular Surgery) | Admitting: Thoracic Surgery (Cardiothoracic Vascular Surgery)

## 2021-03-07 ENCOUNTER — Encounter: Payer: Self-pay | Admitting: Internal Medicine

## 2021-03-07 DIAGNOSIS — I7121 Aneurysm of the ascending aorta, without rupture: Secondary | ICD-10-CM

## 2021-03-07 DIAGNOSIS — R0602 Shortness of breath: Secondary | ICD-10-CM | POA: Diagnosis not present

## 2021-03-07 DIAGNOSIS — I712 Thoracic aortic aneurysm, without rupture, unspecified: Secondary | ICD-10-CM | POA: Diagnosis not present

## 2021-03-07 MED ORDER — IOPAMIDOL (ISOVUE-370) INJECTION 76%
75.0000 mL | Freq: Once | INTRAVENOUS | Status: AC | PRN
  Administered 2021-03-07: 75 mL via INTRAVENOUS

## 2021-03-14 ENCOUNTER — Ambulatory Visit: Payer: Medicare PPO | Admitting: Thoracic Surgery (Cardiothoracic Vascular Surgery)

## 2021-03-14 ENCOUNTER — Other Ambulatory Visit: Payer: Self-pay | Admitting: Thoracic Surgery (Cardiothoracic Vascular Surgery)

## 2021-03-14 ENCOUNTER — Encounter: Payer: Self-pay | Admitting: Thoracic Surgery (Cardiothoracic Vascular Surgery)

## 2021-03-14 ENCOUNTER — Other Ambulatory Visit: Payer: Self-pay

## 2021-03-14 VITALS — BP 138/92 | HR 89 | Resp 20 | Ht 67.0 in | Wt 185.0 lb

## 2021-03-14 DIAGNOSIS — I7121 Aneurysm of the ascending aorta, without rupture: Secondary | ICD-10-CM

## 2021-03-14 LAB — BRAIN NATRIURETIC PEPTIDE: Brain Natriuretic Peptide: 72 pg/mL (ref <100)

## 2021-03-14 NOTE — Progress Notes (Addendum)
[address]       [place] [id number]             [phone]       HPI: [name] returns for a follow-up of her ascending aneurysm.  Natasha Vasquez is an 81 year old woman with past history significant for ascending thoracic aneurysm, hypertension, hyperlipidemia, mitral valve prolapse, PAD, breast cancer, melanoma, reflux, Barrett's esophagitis, nephritis, arthritis, type 2 diabetes, and depression.  She was found to have an ascending aneurysm in 2019 and was followed by Dr. Servando Vasquez.  I saw her in August.  She was doing well at that time.  She had some chronic dyspnea with exertion but that was unchanged.  The aneurysm was read as measuring 4.6 cm so she was scheduled for a 76-month follow-up.  In the interim since that last visit she has been having more shortness of breath.  It has been progressive over the past couple of months.  Still able to walk about a mile but more out of breath when doing so.  Some shortness of breath when she first lays down but does not have to elevate her head.  No PND or peripheral edema.  No chest pain, pressure, or tightness.  Not aware of any recent respiratory infections.  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  ER/PR NEGATIVE   Cataract    Chronic cough    Complication of anesthesia    20 years ago pts b/p dropped really low, "coded"   Depression    Depression    Diabetes mellitus without complication (HCC)    diet controlled   Family history of adverse reaction to anesthesia    son PONV   GERD (gastroesophageal reflux disease)    Gout    Hypercholesteremia    Hyperlipidemia    Hypertension    Melanoma ([place])    MVP (mitral valve prolapse)    followed by PCP   Nephritis    Nephritis    S/P appendectomy    Thoracic aortic aneurysm    Torn meniscus     left    Current Outpatient Medications  Medication Sig Dispense Refill   acetaminophen (TYLENOL) 500 MG tablet Take 500 mg by mouth every 6 (six) hours as needed.     albuterol (PROVENTIL HFA;VENTOLIN HFA) 108 (90 Base) MCG/ACT inhaler Inhale 2 puffs into the lungs every 6 (six) hours as needed for wheezing or shortness of breath. 1 Inhaler 2   allopurinol (ZYLOPRIM) 100 MG tablet Take 1 tablet (100 mg total) by mouth daily. 30 tablet 6   ARIPiprazole (ABILIFY) 2 MG tablet TAKE 1 TABLET BY MOUTH EVERY DAY IN THE EVENING 90 tablet 0   aspirin 81 MG tablet Take 81 mg by mouth daily.     bismuth subsalicylate (PEPTO BISMOL) 262 MG chewable tablet Chew 262 mg by mouth as needed. **capsule**     Bismuth Subsalicylate 737 MG TABS Take 1 tablet by mouth as needed.     calcium carbonate (TUMS - DOSED IN MG ELEMENTAL CALCIUM) 500 MG chewable tablet Chew 1 tablet by mouth as needed for indigestion or heartburn.     clindamycin (CLEOCIN) 150 MG capsule Take 4 capsules by mouth as directed. 1 hour prior to dental procedure  clobetasol ointment (TEMOVATE) 0.05 % APPLY TO AFFECTED AREAS AT NIGHT AS NEEDED     clotrimazole (LOTRIMIN) 1 % cream Apply 1 application topically 2 (two) times daily. 30 g 1   colestipol (COLESTID) 1 g tablet Take 1 g by mouth 2 (two) times daily.     ferrous sulfate 324 MG TBEC Take 324 mg by mouth daily with breakfast.     fluocinonide (LIDEX) 0.05 % external solution APPLY TO AFFECTED AREAS OF SCALP DAILY UNTIL CLEAR, THEN FOR FLARES     lisinopril (ZESTRIL) 5 MG tablet TAKE 1 TABLET BY MOUTH EVERY DAY 90 tablet 0   MITIGARE 0.6 MG CAPS TAKE TWO PILLS ON FIRST DAY, THEN ONE DAILY 30 capsule 2   Multiple Vitamins-Minerals (CENTRUM SILVER PO) Take 1 tablet by mouth daily.     omeprazole (PRILOSEC) 40 MG capsule Take 1 capsule (40 mg total) by mouth 2 (two) times daily. 180 capsule 1   oxybutynin (DITROPAN-XL) 10 MG 24 hr tablet TAKE 1 TABLET BY MOUTH EVERY DAY 90 tablet 0    rosuvastatin (CRESTOR) 10 MG tablet TAKE 1 TABLET BY MOUTH EVERY DAY 90 tablet 0   Venlafaxine HCl 225 MG TB24 TAKE 1 TABLET BY MOUTH EVERY DAY 90 tablet 0   No current facility-administered medications for this visit.    Physical Exam BP (!) 138/92 (BP Location: Left Arm, Patient Position: Sitting)    Pulse 89    Resp 20    Ht 5\' 7"  (1.702 m)    Wt 185 lb (83.9 kg)    SpO2 91% Comment: RA   BMI 28.21 kg/m  81 year old woman in no acute distress Alert and oriented x3 with no focal deficits Lungs clear Cardiac regular rate and rhythm with no rubs, murmurs or gallops No peripheral edema  Diagnostic Tests: CT ANGIOGRAPHY CHEST WITH CONTRAST   TECHNIQUE: Multidetector CT imaging of the chest was performed using the standard protocol during bolus administration of intravenous contrast. Multiplanar CT image reconstructions and MIPs were obtained to evaluate the vascular anatomy.   RADIATION DOSE REDUCTION: This exam was performed according to the departmental dose-optimization program which includes automated exposure control, adjustment of the mA and/or kV according to patient size and/or use of iterative reconstruction technique.   CONTRAST:  44mL ISOVUE-370 IOPAMIDOL (ISOVUE-370) INJECTION 76%   COMPARISON:  08/16/2020   FINDINGS: Cardiovascular: Heart size upper limits normal. No pericardial effusion. The RV is nondilated. Dilated left pulmonary artery. Satisfactory opacification of pulmonary arteries noted, and there is no evidence of pulmonary emboli. Good contrast opacification of the thoracic aorta. No dissection or stenosis. Bovine variant brachiocephalic arterial origin anatomy without stenosis. Scattered calcified atheromatous plaque in the arch and descending thoracic aorta.   Aortic Root:   --Valve: 2.5 cm   --Sinuses: 3.4 cm   --Sinotubular Junction: 2.9 cm   Limitations by motion: Mild   Thoracic Aorta:   --Ascending Aorta: 4.4 cm (previously reported  4.6 cm, although probably not perpendicular to flow direction)   --Aortic Arch: 3.5 cm   --Descending Aorta: 2.9 cm   Mediastinum/Nodes: No mediastinal hematoma, mass or adenopathy. 0.9 cm right hilar lymph node, previously 1 cm.   Lungs/Pleura: No pleural effusion. Ground-glass opacities in the dependent aspect of the lower lobes. More geographic ground-glass opacities in both upper lobes, right worse than left, new since previous.   Upper Abdomen: Mild ectasia of the biliary confluence and proximal visualized CBD as before. No acute findings.   Musculoskeletal: Right breast prosthesis.  Spondylitic changes in the lower cervical spine.   Review of the MIP images confirms the above findings.   IMPRESSION: 1. No progression of 4.4 cm ascending thoracic aortic aneurysm. Recommend annual imaging followup by CTA or MRA. This recommendation follows 2010 ACCF/AHA/AATS/ACR/ASA/SCA/SCAI/SIR/STS/SVM Guidelines for the Diagnosis and Management of Patients with Thoracic Aortic Disease. Circulation. 2010; 121: F758-I325. 2. Interval worsening of geographic ground-glass opacities in both upper lobes, and in the dependent portions of both lung bases. May represent alveolitis, potentially mild edema. 3.  Aortic Atherosclerosis (ICD10-170.0).     Electronically Signed   By: Lucrezia Europe M.D.   On: 03/07/2021 12:57 I personally reviewed the CT images.  On my measurements aneurysm measures about 4.5 cm.  Edema versus alveolitis primarily in upper lobes bilaterally.  Impression: Natasha Vasquez is an 81 year old woman with past history significant for ascending thoracic aneurysm, hypertension, hyperlipidemia, mitral valve prolapse, PAD, breast cancer, melanoma, reflux, Barrett's esophagitis, nephritis, arthritis, type 2 diabetes, and depression.  She is been followed for an ascending aneurysm since 2019.  Ascending aneurysm-stable at 4.4 cm.  Some variation between radiologist in terms of  measurement.  Really no change over the past 18 months.  We will plan to repeat a scan in 1 year.  Groundglass opacities in the lungs-really unclear if this is an infectious or inflammatory issue or related to heart failure.  She really does not have a good history of heart failure.  She had a normal EF on an echo couple years ago.  Did have some diastolic dysfunction.  We will check BNP to see if there might be a heart failure component to this.  Last BNP significantly elevated will refer to Dr. Patsey Berthold of pulmonary to get her input.   Plan: Check BNP although she does not have any overt signs of heart failure Refer to Dr. Patsey Berthold, pulmonary at [facility] for evaluation of groundglass opacities on CT.  I spent 20 minutes in review of records, images, and in consultation with Natasha Vasquez today.  Natasha Nakayama, MD [facility] [phone]  BNP = 72- no CHF  Natasha Din C. Roxan Hockey, MD [facility] [phone]

## 2021-03-15 ENCOUNTER — Encounter: Payer: Self-pay | Admitting: Thoracic Surgery (Cardiothoracic Vascular Surgery)

## 2021-03-21 ENCOUNTER — Ambulatory Visit (INDEPENDENT_AMBULATORY_CARE_PROVIDER_SITE_OTHER): Payer: Medicare PPO

## 2021-03-21 ENCOUNTER — Encounter: Payer: Medicare PPO | Admitting: Family Medicine

## 2021-03-21 ENCOUNTER — Encounter: Payer: Medicare PPO | Admitting: Thoracic Surgery (Cardiothoracic Vascular Surgery)

## 2021-03-21 DIAGNOSIS — Z0000 Encounter for general adult medical examination without abnormal findings: Secondary | ICD-10-CM | POA: Diagnosis not present

## 2021-03-21 NOTE — Patient Instructions (Signed)
Natasha Vasquez , Thank you for taking time to come for your Medicare Wellness Visit. I appreciate your ongoing commitment to your health goals. Please review the following plan we discussed and let me know if I can assist you in the future.   Screening recommendations/referrals: Colonoscopy: 09/07/20 Mammogram: 06/14/20 Bone Density: 05/03/20 Recommended yearly ophthalmology/optometry visit for glaucoma screening and checkup Recommended yearly dental visit for hygiene and checkup  Vaccinations: Influenza vaccine: had at [facility] this season Pneumococcal vaccine: 01/24/16 Tdap vaccine: 10/24/11 Shingles vaccine: Shingrix 04/01/16, 08/14/16   Covid-19:01/30/19, 03/02/19  Advanced directives: yes, requested copy  Conditions/risks identified: none  Next appointment: Follow up in one year for your annual wellness visit -03/26/22 @ 1pm by phone   Preventive Care 29 Years and Older, Female Preventive care refers to lifestyle choices and visits with your health care provider that can promote health and wellness. What does preventive care include? A yearly physical exam. This is also called an annual well check. Dental exams once or twice a year. Routine eye exams. Ask your health care provider how often you should have your eyes checked. Personal lifestyle choices, including: Daily care of your teeth and gums. Regular physical activity. Eating a healthy diet. Avoiding tobacco and drug use. Limiting alcohol use. Practicing safe sex. Taking low-dose aspirin every day. Taking vitamin and mineral supplements as recommended by your health care provider. What happens during an annual well check? The services and screenings done by your health care provider during your annual well check will depend on your age, overall health, lifestyle risk factors, and family history of disease. Counseling  Your health care provider may ask you questions about your: Alcohol use. Tobacco use. Drug use. Emotional  well-being. Home and relationship well-being. Sexual activity. Eating habits. History of falls. Memory and ability to understand (cognition). Work and work Statistician. Reproductive health. Screening  You may have the following tests or measurements: Height, weight, and BMI. Blood pressure. Lipid and cholesterol levels. These may be checked every 5 years, or more frequently if you are over 11 years old. Skin check. Lung cancer screening. You may have this screening every year starting at age 5 if you have a 30-pack-year history of smoking and currently smoke or have quit within the past 15 years. Fecal occult blood test (FOBT) of the stool. You may have this test every year starting at age 52. Flexible sigmoidoscopy or colonoscopy. You may have a sigmoidoscopy every 5 years or a colonoscopy every 10 years starting at age 32. Hepatitis C blood test. Hepatitis B blood test. Sexually transmitted disease (STD) testing. Diabetes screening. This is done by checking your blood sugar (glucose) after you have not eaten for a while (fasting). You may have this done every 1-3 years. Bone density scan. This is done to screen for osteoporosis. You may have this done starting at age 21. Mammogram. This may be done every 1-2 years. Talk to your health care provider about how often you should have regular mammograms. Talk with your health care provider about your test results, treatment options, and if necessary, the need for more tests. Vaccines  Your health care provider may recommend certain vaccines, such as: Influenza vaccine. This is recommended every year. Tetanus, diphtheria, and acellular pertussis (Tdap, Td) vaccine. You may need a Td booster every 10 years. Zoster vaccine. You may need this after age 52. Pneumococcal 13-valent conjugate (PCV13) vaccine. One dose is recommended after age 66. Pneumococcal polysaccharide (PPSV23) vaccine. One dose is recommended after age  103. Talk to your  health care provider about which screenings and vaccines you need and how often you need them. This information is not intended to replace advice given to you by your health care provider. Make sure you discuss any questions you have with your health care provider. Document Released: 01/28/2015 Document Revised: 09/21/2015 Document Reviewed: 11/02/2014 Elsevier Interactive Patient Education  2017 [place] Prevention in the Home Falls can cause injuries. They can happen to people of all ages. There are many things you can do to make your home safe and to help prevent falls. What can I do on the outside of my home? Regularly fix the edges of walkways and driveways and fix any cracks. Remove anything that might make you trip as you walk through a door, such as a raised step or threshold. Trim any bushes or trees on the path to your home. Use bright outdoor lighting. Clear any walking paths of anything that might make someone trip, such as rocks or tools. Regularly check to see if handrails are loose or broken. Make sure that both sides of any steps have handrails. Any raised decks and porches should have guardrails on the edges. Have any leaves, snow, or ice cleared regularly. Use sand or salt on walking paths during winter. Clean up any spills in your garage right away. This includes oil or grease spills. What can I do in the bathroom? Use night lights. Install grab bars by the toilet and in the tub and shower. Do not use towel bars as grab bars. Use non-skid mats or decals in the tub or shower. If you need to sit down in the shower, use a plastic, non-slip stool. Keep the floor dry. Clean up any water that spills on the floor as soon as it happens. Remove soap buildup in the tub or shower regularly. Attach bath mats securely with double-sided non-slip rug tape. Do not have throw rugs and other things on the floor that can make you trip. What can I do in the bedroom? Use night  lights. Make sure that you have a light by your bed that is easy to reach. Do not use any sheets or blankets that are too big for your bed. They should not hang down onto the floor. Have a firm chair that has side arms. You can use this for support while you get dressed. Do not have throw rugs and other things on the floor that can make you trip. What can I do in the kitchen? Clean up any spills right away. Avoid walking on wet floors. Keep items that you use a lot in easy-to-reach places. If you need to reach something above you, use a strong step stool that has a grab bar. Keep electrical cords out of the way. Do not use floor polish or wax that makes floors slippery. If you must use wax, use non-skid floor wax. Do not have throw rugs and other things on the floor that can make you trip. What can I do with my stairs? Do not leave any items on the stairs. Make sure that there are handrails on both sides of the stairs and use them. Fix handrails that are broken or loose. Make sure that handrails are as long as the stairways. Check any carpeting to make sure that it is firmly attached to the stairs. Fix any carpet that is loose or worn. Avoid having throw rugs at the top or bottom of the stairs. If you do have  throw rugs, attach them to the floor with carpet tape. Make sure that you have a light switch at the top of the stairs and the bottom of the stairs. If you do not have them, ask someone to add them for you. What else can I do to help prevent falls? Wear shoes that: Do not have high heels. Have rubber bottoms. Are comfortable and fit you well. Are closed at the toe. Do not wear sandals. If you use a stepladder: Make sure that it is fully opened. Do not climb a closed stepladder. Make sure that both sides of the stepladder are locked into place. Ask someone to hold it for you, if possible. Clearly mark and make sure that you can see: Any grab bars or handrails. First and last  steps. Where the edge of each step is. Use tools that help you move around (mobility aids) if they are needed. These include: Canes. Walkers. Scooters. Crutches. Turn on the lights when you go into a dark area. Replace any light bulbs as soon as they burn out. Set up your furniture so you have a clear path. Avoid moving your furniture around. If any of your floors are uneven, fix them. If there are any pets around you, be aware of where they are. Review your medicines with your doctor. Some medicines can make you feel dizzy. This can increase your chance of falling. Ask your doctor what other things that you can do to help prevent falls. This information is not intended to replace advice given to you by your health care provider. Make sure you discuss any questions you have with your health care provider. Document Released: 10/28/2008 Document Revised: 06/09/2015 Document Reviewed: 02/05/2014 Elsevier Interactive Patient Education  2017 [employer].

## 2021-03-21 NOTE — Progress Notes (Signed)
Virtual Visit via Telephone Note  I connected with  Natasha Vasquez on 03/21/21 at  1:00 PM EST by telephone and verified that I am speaking with the correct person using two identifiers.  Location: Patient: home Provider: [facility] Persons participating in the virtual visit: [place]   I discussed the limitations, risks, security and privacy concerns of performing an evaluation and management service by telephone and the availability of in person appointments. The patient expressed understanding and agreed to proceed.  Interactive audio and video telecommunications were attempted between this nurse and patient, however failed, due to patient having technical difficulties OR patient did not have access to video capability.  We continued and completed visit with audio only.  Some vital signs may be absent or patient reported.   Dionisio David, LPN  Subjective:   Natasha Vasquez is a 81 y.o. female who presents for Medicare Annual (Subsequent) preventive examination.  Review of Systems           Objective:    There were no vitals filed for this visit. There is no height or weight on file to calculate BMI.  Advanced Directives 03/03/2021 09/07/2020 03/17/2020 07/13/2019 03/16/2019 02/27/2019 01/14/2019  Does Patient Have a Medical Advance Directive? Yes No;Yes Yes No Yes No Yes  Type of Advance Directive Living will;Healthcare Power of Attorney Living will [place];Living will - [place];Living will - [place];Living will  Does patient want to make changes to medical advance directive? - - - - - - No - Patient declined  Copy of [place] in Chart? - - Yes - validated most recent copy scanned in chart (See row information) - Yes - validated most recent copy scanned in chart (See row information) - No - copy requested  Would patient like information on creating a medical advance directive? - - -  No - Patient declined - No - Patient declined -    Current Medications (verified) Outpatient Encounter Medications as of 03/21/2021  Medication Sig   acetaminophen (TYLENOL) 500 MG tablet Take 500 mg by mouth every 6 (six) hours as needed.   albuterol (PROVENTIL HFA;VENTOLIN HFA) 108 (90 Base) MCG/ACT inhaler Inhale 2 puffs into the lungs every 6 (six) hours as needed for wheezing or shortness of breath.   allopurinol (ZYLOPRIM) 100 MG tablet Take 1 tablet (100 mg total) by mouth daily.   ARIPiprazole (ABILIFY) 2 MG tablet TAKE 1 TABLET BY MOUTH EVERY DAY IN THE EVENING   aspirin 81 MG tablet Take 81 mg by mouth daily.   bismuth subsalicylate (PEPTO BISMOL) 262 MG chewable tablet Chew 262 mg by mouth as needed. **capsule**   Bismuth Subsalicylate 258 MG TABS Take 1 tablet by mouth as needed.   calcium carbonate (TUMS - DOSED IN MG ELEMENTAL CALCIUM) 500 MG chewable tablet Chew 1 tablet by mouth as needed for indigestion or heartburn.   clindamycin (CLEOCIN) 150 MG capsule Take 4 capsules by mouth as directed. 1 hour prior to dental procedure   clobetasol ointment (TEMOVATE) 0.05 % APPLY TO AFFECTED AREAS AT NIGHT AS NEEDED   clotrimazole (LOTRIMIN) 1 % cream Apply 1 application topically 2 (two) times daily.   colestipol (COLESTID) 1 g tablet Take 1 g by mouth 2 (two) times daily.   ferrous sulfate 324 MG TBEC Take 324 mg by mouth daily with breakfast.   fluocinonide (LIDEX) 0.05 % external solution APPLY TO AFFECTED AREAS OF SCALP DAILY UNTIL  CLEAR, THEN FOR FLARES   lisinopril (ZESTRIL) 5 MG tablet TAKE 1 TABLET BY MOUTH EVERY DAY   MITIGARE 0.6 MG CAPS TAKE TWO PILLS ON FIRST DAY, THEN ONE DAILY   Multiple Vitamins-Minerals (CENTRUM SILVER PO) Take 1 tablet by mouth daily.   omeprazole (PRILOSEC) 40 MG capsule Take 1 capsule (40 mg total) by mouth 2 (two) times daily.   oxybutynin (DITROPAN-XL) 10 MG 24 hr tablet TAKE 1 TABLET BY MOUTH EVERY DAY   rosuvastatin (CRESTOR) 10 MG tablet TAKE 1  TABLET BY MOUTH EVERY DAY   Venlafaxine HCl 225 MG TB24 TAKE 1 TABLET BY MOUTH EVERY DAY   No facility-administered encounter medications on file as of 03/21/2021.    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  ER/PR NEGATIVE   Cataract    Chronic cough    Complication of anesthesia    20 years ago pts b/p dropped really low, "coded"   Depression    Depression    Diabetes mellitus without complication (HCC)    diet controlled   Family history of adverse reaction to anesthesia    son PONV   GERD (gastroesophageal reflux disease)    Gout    Hypercholesteremia    Hyperlipidemia    Hypertension    Melanoma ([place])    MVP (mitral valve prolapse)    followed by PCP   Nephritis    Nephritis    S/P appendectomy    Thoracic aortic aneurysm    Torn meniscus    left   Past Surgical History:  Procedure Laterality Date   ABDOMINAL HYSTERECTOMY     APPENDECTOMY     BREAST BIOPSY Right 04/25/2017   retroareolar 10:00   wing clip   path pending   BREAST BIOPSY Right 04/25/2017   10:00  5CMFN  venus clip    path pending   BREAST BIOPSY Right 05/03/2017   Affirm Bx- path pending   BREAST CYST ASPIRATION Bilateral    NEG   BREAST EXCISIONAL BIOPSY Left 20+ yrs ago   NEG   BREAST RECONSTRUCTION WITH PLACEMENT OF TISSUE EXPANDER AND FLEX HD (ACELLULAR HYDRATED DERMIS) Right 06/03/2017   Procedure: BREAST RECONSTRUCTION WITH PLACEMENT OF TISSUE EXPANDER AND FLEX HD (ACELLULAR HYDRATED DERMIS);  Surgeon: Wallace Going, DO;  Location: [facility];  Service: Plastics;  Laterality: Right;   BREAST REDUCTION SURGERY Left 11/06/2017   Procedure: BREAST REDUCTION;  Surgeon: Wallace Going, DO;  Location:  [facility];  Service: Plastics;  Laterality: Left;   BROW LIFT Bilateral 02/01/2015   Procedure: BLEPHAROPLASTY bilateral upper eyelids.;  Surgeon: Karle Starch, MD;  Location: [place];  Service: Ophthalmology;  Laterality: Bilateral;  DIABETIC - diet controlled   CATARACT EXTRACTION W/PHACO Left 01/14/2019   Procedure: CATARACT EXTRACTION PHACO AND INTRAOCULAR LENS PLACEMENT (IOC) LEFT TECNIS TORIC ADD 5.62 00:46.5 30.0%;  Surgeon: Leandrew Koyanagi, MD;  Location: [place];  Service: Ophthalmology;  Laterality: Left;  diabetic - diet controlled   CATARACT EXTRACTION W/PHACO Right 02/11/2019   Procedure: CATARACT EXTRACTION PHACO AND INTRAOCULAR LENS PLACEMENT (IOC) RIGHT DIABETIC 4.82 00:46.9 10.3%;  Surgeon: Leandrew Koyanagi, MD;  Location: [place];  Service: Ophthalmology;  Laterality:  Right;  Diabetic - diet controlled   CHOLECYSTECTOMY     COLONOSCOPY     COLONOSCOPY WITH PROPOFOL N/A 05/11/2015   Procedure: COLONOSCOPY WITH PROPOFOL;  Surgeon: Manya Silvas, MD;  Location[facility];  Service: Endoscopy;  Laterality: N/A;   COLONOSCOPY WITH PROPOFOL N/A 09/07/2020   Procedure: COLONOSCOPY WITH PROPOFOL;  Surgeon: Toledo, Benay Pike, MD;  Location: [facility];  Service: Gastroenterology;  Laterality: N/A;   ESOPHAGOGASTRODUODENOSCOPY     ESOPHAGOGASTRODUODENOSCOPY (EGD) WITH PROPOFOL  05/11/2015   Procedure: ESOPHAGOGASTRODUODENOSCOPY (EGD) WITH PROPOFOL;  Surgeon: Manya Silvas, MD;  Location[facility];  Service: Endoscopy;;   FRACTURE SURGERY Right    arm and shoulder   JOINT REPLACEMENT     KNEE ARTHROSCOPY Left 03/14/2016   Procedure: ARTHROSCOPY KNEE, PARTIAL MEDIAL MENISECTOMY, CHONDROPLASTY;  Surgeon: Dereck Leep, MD;  Location: [facility];  Service: Orthopedics;  Laterality: Left;   MASTECTOMY Right 2019   MASTECTOMY W/ SENTINEL NODE BIOPSY Right 06/03/2017   Procedure: MASTECTOMY WITH SENTINEL LYMPH NODE BIOPSY;  Surgeon:  Robert Bellow, MD;  Location: [facility];  Service: General;  Laterality: Right;   PARTIAL HIP ARTHROPLASTY Right    REDUCTION MAMMAPLASTY Left 2019   REMOVAL OF BILATERAL TISSUE EXPANDERS WITH PLACEMENT OF BILATERAL BREAST IMPLANTS Right 11/06/2017   Procedure: REMOVAL OF TISSUE EXPANDER WITH PLACEMENT OF BREAST IMPLANT;  Surgeon: Wallace Going, DO;  Location: [facility];  Service: Plastics;  Laterality: Right;   TONSILLECTOMY     Family History  Problem Relation Age of Onset   Cancer Mother        colon/rectal   Colon cancer Mother    Heart disease Father    Hypertension Father    Atrial fibrillation Father    Diabetes Father    Cancer Brother        lung   Healthy Son    Healthy Son    Breast cancer Neg Hx    Social History   Socioeconomic History   Marital status: Married    Spouse name: Sonia Side   Number of children: 2   Years of education: Not on file   Highest education level: Master's degree (e.g., MA, MS, MEng, MEd, MSW, MBA)  Occupational History   Occupation: retired  Tobacco Use   Smoking status: Former    Types: Cigarettes    Quit date: 1965    Years since quitting: 58.2   Smokeless tobacco: Never   Tobacco comments:    smoked in college  Vaping Use   Vaping Use: Never used  Substance and Sexual Activity   Alcohol use: Yes    Alcohol/week: 7.0 - 14.0 standard drinks    Types: 7 - 14 Glasses of wine per week    Comment: 1-2 glasses of wine per night   Drug use: No   Sexual activity: Never  Other Topics Concern   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    Social Determinants of Health   Financial Resource Strain: Not on file  Food Insecurity: Not on file  Transportation Needs: Not on file  Physical Activity: Not on file  Stress: Not on file  Social Connections: Not on file    Tobacco Counseling Counseling given: Not Answered Tobacco comments:  smoked in college   Clinical Intake:  Pre-visit preparation completed: Yes  Pain : No/denies pain     Nutritional Risks: None Diabetes:  Yes CBG done?: No Did pt. bring in CBG monitor from home?: No  How often do you need to have someone help you when you read instructions, pamphlets, or other written materials from your doctor or pharmacy?: 1 - Never  Diabetic?yes Nutrition Risk Assessment:  Has the patient had any N/V/D within the last 2 months?  Yes  Does the patient have any non-healing wounds?  No  Has the patient had any unintentional weight loss or weight gain?  No   Diabetes:  Is the patient diabetic?  Yes  If diabetic, was a CBG obtained today?  No  Did the patient bring in their glucometer from home?  No  How often do you monitor your CBG's? never.   Financial Strains and Diabetes Management:  Are you having any financial strains with the device, your supplies or your medication? No .  Does the patient want to be seen by Chronic Care Management for management of their diabetes?  No  Would the patient like to be referred to a Nutritionist or for Diabetic Management?  No   Diabetic Exams:  Diabetic Eye Exam: Completed 09/29/20.  Pt has been advised about the importance in completing this exam.   Diabetic Foot Exam: Completed 03/17/20. Pt has been advised about the importance in completing this exam.   Interpreter Needed?: No  Information entered by :: Kirke Shaggy, LPN   Activities of Daily Living In your present state of health, do you have any difficulty performing the following activities: 11/04/2020  Hearing? N  Vision? N  Difficulty concentrating or making decisions? N  Walking or climbing stairs? Y  Dressing or bathing? N  Doing errands, shopping? N  Some recent data might be hidden    Patient Care Team: Virginia Crews, MD as PCP - General (Family Medicine) Watt Climes, PA as Physician Assistant (Physician Assistant) Marry Guan Laurice Record, MD  as Consulting Physician (Orthopedic Surgery) Bary Castilla Forest Gleason, MD (General Surgery) Dasher, Rayvon Char, MD (Dermatology) Grace Isaac, MD (Inactive) as Consulting Physician (Cardiothoracic Surgery) Cammie Sickle, MD as Consulting Physician (Internal Medicine) Leandrew Koyanagi, MD as Referring Physician (Ophthalmology)  Indicate any recent Medical Services you may have received from other than [facility] providers in the past year (date may be approximate).     Assessment:   This is a routine wellness examination for [place].  Hearing/Vision screen No results found.  Dietary issues and exercise activities discussed:     Goals Addressed   None    Depression Screen PHQ 2/9 Scores 11/04/2020 03/17/2020 03/17/2020 03/16/2019 03/12/2018 09/12/2017 06/12/2017  PHQ - 2 Score 0 0 0 '1 2 2 '$ 0  PHQ- 9 Score 6 1 - - '9 7 3    '$ Fall Risk Fall Risk  11/04/2020 03/17/2020 03/16/2019 07/28/2018 07/08/2018  Falls in the past year? 1 0 0 0 0  Number falls in past yr: 0 0 0 0 -  Injury with Fall? 0 0 0 - -  Risk for fall due to : No Fall Risks - - - -  Follow up Falls evaluation completed - - - Falls evaluation completed    FALL RISK PREVENTION PERTAINING TO THE HOME:  Any stairs in or around the home? Yes  If so, are there any without handrails? No  Home free of loose throw rugs in walkways, pet beds, electrical cords, etc? Yes  Adequate lighting in your home to reduce risk of falls? Yes   ASSISTIVE DEVICES UTILIZED TO PREVENT FALLS:  Life alert? Yes  Use of a cane, walker or w/c? No  Grab bars in the bathroom? Yes  Shower chair or bench in shower? Yes  Elevated toilet seat or a handicapped toilet? Yes   Cognitive Function:    6CIT Screen 03/12/2018 03/11/2017  What Year? 0 points 0 points  What month? 0 points 0 points  What time? 0 points 0 points  Count back from 20 0 points 0 points  Months in reverse 2 points 0 points  Repeat phrase 0 points 0 points  Total Score 2 0     Immunizations Immunization History  Administered Date(s) Administered   Fluad Quad(high Dose 65+) 09/09/2018, 10/12/2019   Influenza Split 10/24/2011   Influenza, High Dose Seasonal PF 11/18/2013, 01/24/2016, 10/08/2016, 09/12/2017   Influenza-Unspecified 10/24/2011, 10/14/2012, 11/18/2013   Moderna Sars-Covid-2 Vaccination 01/30/2019, 03/02/2019   Pneumococcal Conjugate-13 08/21/2013   Pneumococcal Polysaccharide-23 01/24/2016   Tdap 10/24/2011   Zoster Recombinat (Shingrix) 04/01/2016, 08/14/2016    TDAP status: Due, Education has been provided regarding the importance of this vaccine. Advised may receive this vaccine at local pharmacy or Health Dept. Aware to provide a copy of the vaccination record if obtained from local pharmacy or Health Dept. Verbalized acceptance and understanding.  Flu Vaccine status: Up to date  Pneumococcal vaccine status: Up to date  Covid-19 vaccine status: Completed vaccines  Qualifies for Shingles Vaccine? No   Zostavax completed No   Shingrix Completed?: Yes  Screening Tests Health Maintenance  Topic Date Due   COVID-19 Vaccine (3 - Moderna risk series) 03/30/2019   INFLUENZA VACCINE  08/15/2020   HEMOGLOBIN A1C  09/25/2020   FOOT EXAM  03/17/2021   OPHTHALMOLOGY EXAM  09/29/2021   TETANUS/TDAP  10/23/2021   DEXA SCAN  05/03/2025   Pneumonia Vaccine 39+ Years old  Completed   Zoster Vaccines- Shingrix  Completed   HPV VACCINES  Aged Out    Health Maintenance  Health Maintenance Due  Topic Date Due   COVID-19 Vaccine (3 - Moderna risk series) 03/30/2019   INFLUENZA VACCINE  08/15/2020   HEMOGLOBIN A1C  09/25/2020   FOOT EXAM  03/17/2021    Colorectal cancer screening: Type of screening: Colonoscopy. Completed 09/07/20. Repeat every 5 years  Mammogram status: Completed 06/14/20. Repeat every year  Bone Density status: Completed 05/03/20. Results reflect: Bone density results: OSTEOPENIA. Repeat every 5 years.  Lung Cancer  Screening: (Low Dose CT Chest recommended if Age 56-80 years, 30 pack-year currently smoking OR have quit w/in 15years.) does not qualify.    Additional Screening:  Hepatitis C Screening: does not qualify; Completed no  Vision Screening: Recommended annual ophthalmology exams for early detection of glaucoma and other disorders of the eye. Is the patient up to date with their annual eye exam?  Yes  Who is the provider or what is the name of the office in which the patient attends annual eye exams? [facility] If pt is not established with a provider, would they like to be referred to a provider to establish care? No .   Dental Screening: Recommended annual dental exams for proper oral hygiene  Community Resource Referral / Chronic Care Management: CRR required this visit?  No   CCM required this visit?  No      Plan:     I have personally reviewed and noted the following in the patients chart:   Medical and social history Use of alcohol, tobacco or illicit drugs  Current medications and supplements including  opioid prescriptions.  Functional ability and status Nutritional status Physical activity Advanced directives List of other physicians Hospitalizations, surgeries, and ER visits in previous 12 months Vitals Screenings to include cognitive, depression, and falls Referrals and appointments  In addition, I have reviewed and discussed with patient certain preventive protocols, quality metrics, and best practice recommendations. A written personalized care plan for preventive services as well as general preventive health recommendations were provided to patient.     Dionisio David, LPN   07/22/2421   Nurse Notes: none

## 2021-03-22 ENCOUNTER — Other Ambulatory Visit: Payer: Self-pay | Admitting: Family Medicine

## 2021-03-22 DIAGNOSIS — F4322 Adjustment disorder with anxiety: Secondary | ICD-10-CM

## 2021-03-22 DIAGNOSIS — M1A9XX0 Chronic gout, unspecified, without tophus (tophi): Secondary | ICD-10-CM

## 2021-03-23 ENCOUNTER — Other Ambulatory Visit: Payer: Self-pay | Admitting: Family Medicine

## 2021-03-23 DIAGNOSIS — F4322 Adjustment disorder with anxiety: Secondary | ICD-10-CM

## 2021-03-23 MED ORDER — ARIPIPRAZOLE 2 MG PO TABS: 2.0000 mg | ORAL_TABLET | Freq: Every day | ORAL | 1 refills | Status: DC

## 2021-03-23 NOTE — Progress Notes (Signed)
I,Sulibeya S Dimas,acting as a Education administrator for Lavon Paganini, MD.,have documented all relevant documentation on the behalf of Lavon Paganini, MD,as directed by  Lavon Paganini, MD while in the presence of Lavon Paganini, MD.  Complete physical exam   Patient: Natasha Vasquez   DOB: Dec 23, 1940   81 y.o. Female  MRN: [id number] Visit Date: 03/24/2021  Today's healthcare provider: Lavon Paganini, MD   Chief Complaint  Patient presents with   Annual Exam   Subjective    Natasha Vasquez is a 81 y.o. female who presents today for a complete physical exam.  She reports consuming a general diet. Home exercise routine includes stretching. She generally feels well. She reports sleeping well. She does not have additional problems to discuss today.  HPI  AWV with [facility] 03/21/21  Had gout flare and went to UC - was treated with 2 medications and they helped, but she is not sure what they are. I think she is on allopurinol 159m daily and colchicine prn.   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  ER/PR NEGATIVE   Cataract    Chronic cough    Complication of anesthesia    20 years ago pts b/p dropped really low, "coded"   Depression    Depression    Diabetes mellitus without complication (HCC)    diet controlled   Family history of adverse reaction to anesthesia    son PONV   GERD (gastroesophageal reflux disease)    Gout    Hypercholesteremia    Hyperlipidemia    Hypertension    Melanoma ([place]    MVP (mitral valve prolapse)    followed by PCP   Nephritis    Nephritis    S/P appendectomy    Thoracic aortic aneurysm    Torn meniscus    left   Past Surgical History:  Procedure Laterality Date   ABDOMINAL HYSTERECTOMY     APPENDECTOMY     BREAST BIOPSY Right 04/25/2017   retroareolar 10:00   wing clip    path pending   BREAST BIOPSY Right 04/25/2017   10:00  5CMFN  venus clip    path pending   BREAST BIOPSY Right 05/03/2017   Affirm Bx- path pending   BREAST CYST ASPIRATION Bilateral    NEG   BREAST EXCISIONAL BIOPSY Left 20+ yrs ago   NEG   BREAST RECONSTRUCTION WITH PLACEMENT OF TISSUE EXPANDER AND FLEX HD (ACELLULAR HYDRATED DERMIS) Right 06/03/2017   Procedure: BREAST RECONSTRUCTION WITH PLACEMENT OF TISSUE EXPANDER AND FLEX HD (ACELLULAR HYDRATED DERMIS);  Surgeon: DWallace Going DO;  Location: [facility];  Service: Plastics;  Laterality: Right;   BREAST REDUCTION SURGERY Left 11/06/2017   Procedure: BREAST REDUCTION;  Surgeon: DWallace Going DO;  Location: [facility];  Service: Plastics;  Laterality: Left;   BROW LIFT Bilateral 02/01/2015   Procedure: BLEPHAROPLASTY bilateral upper eyelids.;  Surgeon: AKarle Starch MD;  [place]  Service: Ophthalmology;  Laterality: Bilateral;  DIABETIC - diet controlled   CATARACT EXTRACTION W/PHACO Left 01/14/2019   Procedure: CATARACT EXTRACTION PHACO AND INTRAOCULAR LENS PLACEMENT (IOC) LEFT TECNIS TORIC ADD 5.62 00:46.5 30.0%;  Surgeon: BLeandrew Koyanagi MD;  [place]  Service: Ophthalmology;  Laterality: Left;  diabetic -  diet controlled   CATARACT EXTRACTION W/PHACO Right 02/11/2019   Procedure: CATARACT EXTRACTION PHACO AND INTRAOCULAR LENS PLACEMENT (IOC) RIGHT DIABETIC 4.82 00:46.9 10.3%;  Surgeon: Leandrew Koyanagi, MD;  Location: [place];  Service: Ophthalmology;  Laterality: Right;  Diabetic - diet controlled   CHOLECYSTECTOMY     COLONOSCOPY     COLONOSCOPY WITH PROPOFOL N/A 05/11/2015   Procedure: COLONOSCOPY WITH PROPOFOL;  Surgeon: Manya Silvas, MD;  Location: [facility];  Service: Endoscopy;  Laterality: N/A;   COLONOSCOPY WITH PROPOFOL N/A 09/07/2020   Procedure: COLONOSCOPY WITH PROPOFOL;  Surgeon: Toledo, Benay Pike, MD;  Location: [facility];  Service:  Gastroenterology;  Laterality: N/A;   ESOPHAGOGASTRODUODENOSCOPY     ESOPHAGOGASTRODUODENOSCOPY (EGD) WITH PROPOFOL  05/11/2015   Procedure: ESOPHAGOGASTRODUODENOSCOPY (EGD) WITH PROPOFOL;  Surgeon: Manya Silvas, MD;  Location: [facility];  Service: Endoscopy;;   FRACTURE SURGERY Right    arm and shoulder   JOINT REPLACEMENT     KNEE ARTHROSCOPY Left 03/14/2016   Procedure: ARTHROSCOPY KNEE, PARTIAL MEDIAL MENISECTOMY, CHONDROPLASTY;  Surgeon: Dereck Leep, MD;  Location: [facility];  Service: Orthopedics;  Laterality: Left;   MASTECTOMY Right 2019   MASTECTOMY W/ SENTINEL NODE BIOPSY Right 06/03/2017   Procedure: MASTECTOMY WITH SENTINEL LYMPH NODE BIOPSY;  Surgeon: Robert Bellow, MD;  Location: [facility];  Service: General;  Laterality: Right;   PARTIAL HIP ARTHROPLASTY Right    REDUCTION MAMMAPLASTY Left 2019   REMOVAL OF BILATERAL TISSUE EXPANDERS WITH PLACEMENT OF BILATERAL BREAST IMPLANTS Right 11/06/2017   Procedure: REMOVAL OF TISSUE EXPANDER WITH PLACEMENT OF BREAST IMPLANT;  Surgeon: Wallace Going, DO;  Location: [facility];  Service: Plastics;  Laterality: Right;   TONSILLECTOMY     Social History   Socioeconomic History   Marital status: Married    Spouse name: Sonia Side   Number of children: 2   Years of education: Not on file   Highest education level: Master's degree (e.g., MA, MS, MEng, MEd, MSW, MBA)  Occupational History   Occupation: retired  Tobacco Use   Smoking status: Former    Types: Cigarettes    Quit date: 1965    Years since quitting: 58.2   Smokeless tobacco: Never   Tobacco comments:    smoked in college  Vaping Use   Vaping Use: Never used  Substance and Sexual Activity   Alcohol use: Yes    Alcohol/week: 7.0 - 14.0 standard drinks    Types: 7 - 14 Glasses of wine per week    Comment: 1-2 glasses of wine per night   Drug use: No   Sexual activity: Never  Other Topics Concern   Not on file  Social History Narrative   [place]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    Social Determinants of Health   Financial Resource Strain: Low Risk    Difficulty of Paying Living Expenses: Not hard at all  Food Insecurity: No Food Insecurity   Worried About Charity fundraiser in the Last Year: Never true   [place] in the Last Year: Never true  Transportation Needs: No Transportation Needs   Lack of Transportation (Medical): No   Lack of Transportation (Non-Medical): No  Physical Activity: Sufficiently Active   Days of Exercise per Week: 5 days   Minutes of Exercise per Session: 30 min  Stress: No Stress Concern Present   Feeling of Stress : Not at all  Social Connections:  Socially Integrated   Frequency of Communication with Friends and Family: More than three times a week   Frequency of Social Gatherings with Friends and Family: More than three times a week   Attends Religious Services: More than 4 times per year   Active Member of [employer] or Organizations: Yes   Attends Music therapist: More than 4 times per year   Marital Status: Married  Human resources officer Violence: Not At Risk   Fear of Current or Ex-Partner: No   Emotionally Abused: No   Physically Abused: No   Sexually Abused: No   Family Status  Relation Name Status   Mother  Deceased   Father  Deceased   Brother  Deceased   Son  Alive   Son  Alive   Neg Hx  (Not Specified)   Family History  Problem Relation Age of Onset   Cancer Mother        colon/rectal   Colon cancer Mother    Heart disease Father    Hypertension Father    Atrial fibrillation Father    Diabetes Father    Cancer Brother        lung   Healthy Son    Healthy Son    Breast cancer Neg Hx    Allergies  Allergen Reactions   Codeine Other (See Comments)    Chest pain   Lorazepam Other (See Comments)    "Feels loopy"   Oxycodone Other (See Comments)    MENTAL CHANGES   Paregoric     Chest pain---tolerated  MORPHINE   Penicillins Hives    Has patient had a PCN reaction causing immediate rash, facial/tongue/throat swelling, SOB or lightheadedness with hypotension: Yes Has patient had a PCN reaction causing severe rash involving mucus membranes or skin necrosis: No Has patient had a PCN reaction that required hospitalization: NO Has patient had a PCN reaction occurring within the last 10 years: No If all of the above answers are "NO", then may proceed with Cephalosporin use.  Can take Augmentin   Quinolones     PATIENT UNAWARE OF allergy to this class of antibiotics, only aware of allergy to [facility] ([facility]) Patient was warned about not using Cipro and similar antibiotics. Recent studies have raised concern that fluoroquinolone antibiotics could be associated with an increased risk of aortic aneurysm Fluoroquinolones have non-antimicrobial properties that might jeopardise the integrity of the extracellular matrix of the vascular wall In a  propensity score matched cohort study in [place], there was a 66% increased rate   Aleve [Naproxen Sodium] Hives, Rash and Other (See Comments)    headaches    Patient Care Team: Virginia Crews, MD as PCP - General (Family Medicine) Watt Climes, PA as Physician Assistant (Physician Assistant) Marry Guan, Laurice Record, MD as Consulting Physician (Orthopedic Surgery) Bary Castilla, Forest Gleason, MD (General Surgery) Dasher, Rayvon Char, MD (Dermatology) Grace Isaac, MD (Inactive) as Consulting Physician (Cardiothoracic Surgery) Cammie Sickle, MD as Consulting Physician (Internal Medicine) Leandrew Koyanagi, MD as Referring Physician (Ophthalmology)   Medications: Outpatient Medications Prior to Visit  Medication Sig   acetaminophen (TYLENOL) 500 MG tablet Take 500 mg by mouth every 6 (six) hours as needed.   albuterol (PROVENTIL HFA;VENTOLIN HFA) 108 (90 Base) MCG/ACT inhaler Inhale 2 puffs into the lungs every 6 (six) hours as needed for wheezing or shortness of  breath.   allopurinol (ZYLOPRIM) 100 MG tablet TAKE 1 TABLET BY MOUTH EVERY DAY   ARIPiprazole (ABILIFY) 2 MG tablet  Take 1 tablet (2 mg total) by mouth daily.   aspirin 81 MG tablet Take 81 mg by mouth daily.   bismuth subsalicylate (PEPTO BISMOL) 262 MG chewable tablet Chew 262 mg by mouth as needed. **capsule**   Bismuth Subsalicylate 397 MG TABS Take 1 tablet by mouth as needed.   calcium carbonate (TUMS - DOSED IN MG ELEMENTAL CALCIUM) 500 MG chewable tablet Chew 1 tablet by mouth as needed for indigestion or heartburn.   clindamycin (CLEOCIN) 150 MG capsule Take 4 capsules by mouth as directed. 1 hour prior to dental procedure   clobetasol ointment (TEMOVATE) 0.05 % APPLY TO AFFECTED AREAS AT NIGHT AS NEEDED   clotrimazole (LOTRIMIN) 1 % cream Apply 1 application topically 2 (two) times daily.   colestipol (COLESTID) 1 g tablet Take 1 g by mouth 2 (two) times daily.   ferrous sulfate 324 MG TBEC Take 324 mg by mouth daily with breakfast.   fluocinonide (LIDEX) 0.05 % external solution APPLY TO AFFECTED AREAS OF SCALP DAILY UNTIL CLEAR, THEN FOR FLARES   lisinopril (ZESTRIL) 5 MG tablet TAKE 1 TABLET BY MOUTH EVERY DAY   MITIGARE 0.6 MG CAPS TAKE TWO PILLS ON FIRST DAY, THEN ONE DAILY   Multiple Vitamins-Minerals (CENTRUM SILVER PO) Take 1 tablet by mouth daily.   omeprazole (PRILOSEC) 40 MG capsule Take 1 capsule (40 mg total) by mouth 2 (two) times daily.   oxybutynin (DITROPAN-XL) 10 MG 24 hr tablet TAKE 1 TABLET BY MOUTH EVERY DAY   rosuvastatin (CRESTOR) 10 MG tablet TAKE 1 TABLET BY MOUTH EVERY DAY   Venlafaxine HCl 225 MG TB24 TAKE 1 TABLET BY MOUTH EVERY DAY   No facility-administered medications prior to visit.    Review of Systems  Constitutional:  Positive for diaphoresis.  Eyes:  Positive for photophobia.  Respiratory:  Positive for cough, choking, shortness of breath and wheezing.   Genitourinary:  Positive for frequency.   Last CBC Lab Results  Component Value Date    WBC 8.0 03/03/2021   HGB 12.7 03/03/2021   HCT 39.6 03/03/2021   MCV 87.4 03/03/2021   MCH 28.0 03/03/2021   RDW 15.2 03/03/2021   PLT 350 67/[birth year]/1937   Last metabolic panel Lab Results  Component Value Date   GLUCOSE 112 (H) 03/25/2020   NA 143 03/25/2020   K 4.9 03/25/2020   CL 104 03/25/2020   CO2 22 03/25/2020   BUN 15 03/25/2020   CREATININE 0.81 03/25/2020   EGFR 73 03/25/2020   CALCIUM 9.5 03/25/2020   PROT 7.0 03/25/2020   ALBUMIN 4.5 03/25/2020   LABGLOB 2.5 03/25/2020   AGRATIO 1.8 03/25/2020   BILITOT 0.2 03/25/2020   ALKPHOS 104 03/25/2020   AST 18 03/25/2020   ALT 57 (H) 03/25/2020   Last lipids Lab Results  Component Value Date   CHOL 220 (H) 03/25/2020   HDL 39 (L) 03/25/2020   LDLCALC 134 (H) 03/25/2020   TRIG 259 (H) 03/25/2020   CHOLHDL 5.6 (H) 03/25/2020   Last hemoglobin A1c Lab Results  Component Value Date   HGBA1C 6.0 (H) 03/25/2020   Last thyroid functions Lab Results  Component Value Date   TSH 3.060 03/25/2020      Objective    BP 133/84 (BP Location: Left Arm, Patient Position: Sitting, Cuff Size: Large)    Pulse 74    Temp 97.7 F (36.5 C) (Temporal)    Resp 20    Ht 5' 4.5" (1.638 m)    Wt 190  lb 14.4 oz (86.6 kg)    SpO2 96%    BMI 32.26 kg/m  BP Readings from Last 3 Encounters:  03/24/21 133/84  03/14/21 (!) 138/92  03/03/21 135/81   Wt Readings from Last 3 Encounters:  03/24/21 190 lb 14.4 oz (86.6 kg)  03/14/21 185 lb (83.9 kg)  03/03/21 189 lb (85.7 kg)   Physical Exam Vitals reviewed.  Constitutional:      General: She is not in acute distress.    Appearance: Normal appearance. She is well-developed. She is not diaphoretic.  HENT:     Head: Normocephalic and atraumatic.     Right Ear: Tympanic membrane, ear canal and external ear normal.     Left Ear: Tympanic membrane, ear canal and external ear normal.     Nose: Nose normal.     Mouth/Throat:     Mouth: Mucous membranes are moist.     Pharynx:  Oropharynx is clear. No oropharyngeal exudate.  Eyes:     General: No scleral icterus.    Conjunctiva/sclera: Conjunctivae normal.     Pupils: Pupils are equal, round, and reactive to light.  Neck:     Thyroid: No thyromegaly.  Cardiovascular:     Rate and Rhythm: Normal rate and regular rhythm.     Pulses: Normal pulses.     Heart sounds: Normal heart sounds. No murmur heard. Pulmonary:     Effort: Pulmonary effort is normal. No respiratory distress.     Breath sounds: Normal breath sounds. No wheezing or rales.  Abdominal:     General: There is no distension.     Palpations: Abdomen is soft.     Tenderness: There is no abdominal tenderness.  Musculoskeletal:        General: No deformity.     Cervical back: Neck supple.     Right lower leg: No edema.     Left lower leg: No edema.  Lymphadenopathy:     Cervical: No cervical adenopathy.  Skin:    General: Skin is warm and dry.     Findings: No rash.  Neurological:     Mental Status: She is alert and oriented to person, place, and time. Mental status is at baseline.     Sensory: No sensory deficit.     Motor: No weakness.     Gait: Gait normal.  Psychiatric:        Mood and Affect: Mood normal.        Behavior: Behavior normal.        Thought Content: Thought content normal.    PHQ 2/9 Scores 03/21/2021 11/04/2020 03/17/2020  PHQ - 2 Score 0 0 0  PHQ- 9 Score - 6 1   Last fall risk screening Fall Risk  03/21/2021  Falls in the past year? 1  Number falls in past yr: 0  Injury with Fall? 1  Risk for fall due to : History of fall(s)  Follow up Falls prevention discussed   Last Audit-C alcohol use screening Alcohol Use Disorder Test (AUDIT) 03/21/2021  1. How often do you have a drink containing alcohol? 4  2. How many drinks containing alcohol do you have on a typical day when you are drinking? 0  3. How often do you have six or more drinks on one occasion? 0  AUDIT-C Score 4  4. How often during the last year have you  found that you were not able to stop drinking once you had started? 0  5. How often during  the last year have you failed to do what was normally expected from you because of drinking? 0  6. How often during the last year have you needed a first drink in the morning to get yourself going after a heavy drinking session? 0  7. How often during the last year have you had a feeling of guilt of remorse after drinking? 0  8. How often during the last year have you been unable to remember what happened the night before because you had been drinking? 0  9. Have you or someone else been injured as a result of your drinking? 0  10. Has a relative or friend or a doctor or another health worker been concerned about your drinking or suggested you cut down? 0  Alcohol Use Disorder Identification Test Final Score (AUDIT) 4  Alcohol Brief Interventions/Follow-up -   A score of 3 or more in women, and 4 or more in men indicates increased risk for alcohol abuse, EXCEPT if all of the points are from question 1   No results found for any visits on 03/24/21.  Assessment & Plan    Routine Health Maintenance and Physical Exam  Exercise Activities and Dietary recommendations  Goals      DIET - EAT MORE FRUITS AND VEGETABLES     DIET - INCREASE WATER INTAKE     Recommend to increase water intake to 6-8 8 oz glasses a day.      Exercise 3x per week (30 min per time)     Recommend to exercise for 3 days a week for at least 30 minutes at a time.          Immunization History  Administered Date(s) Administered   Fluad Quad(high Dose 65+) 09/09/2018, 10/12/2019   Influenza Split 10/24/2011   Influenza, High Dose Seasonal PF 11/18/2013, 01/24/2016, 10/08/2016, 09/12/2017   Influenza-Unspecified 10/24/2011, 10/14/2012, 11/18/2013   Moderna Sars-Covid-2 Vaccination 01/30/2019, 03/02/2019   Pneumococcal Conjugate-13 08/21/2013   Pneumococcal Polysaccharide-23 01/24/2016   Tdap 10/24/2011   Zoster Recombinat  (Shingrix) 04/01/2016, 08/14/2016    Health Maintenance  Topic Date Due   COVID-19 Vaccine (3 - Moderna risk series) 03/30/2019   HEMOGLOBIN A1C  09/25/2020   FOOT EXAM  03/17/2021   INFLUENZA VACCINE  04/14/2021 (Originally 08/15/2020)   OPHTHALMOLOGY EXAM  09/29/2021   TETANUS/TDAP  10/23/2021   DEXA SCAN  05/03/2025   Pneumonia Vaccine 43+ Years old  Completed   Zoster Vaccines- Shingrix  Completed   HPV VACCINES  Aged Out    Discussed health benefits of physical activity, and encouraged her to engage in regular exercise appropriate for her age and condition.  Problem List Items Addressed This Visit       Cardiovascular and Mediastinum   Hypertension associated with diabetes ([place])    Well controlled Continue current medications Recheck metabolic panel F/u in 6 months         Respiratory   Emphysema lung ([place])    Upcoming appt with Pulm Continues to have SOB        Digestive   Chronic diarrhea    Functional Treat by GI with Xifaxin and colestipol Improving but not entirely gone        Endocrine   Hyperlipidemia associated with type 2 diabetes mellitus ([place])    Previously well controlled Continue statin and CoQ10 Repeat FLP and CMP Goal LDL < 70      Relevant Orders   Comprehensive metabolic panel   Lipid Panel With LDL/HDL  Ratio   Subclinical hypothyroidism    Asymptomatic Recheck TSH and free T4      Relevant Orders   TSH + free T4   Type 2 diabetes mellitus (HCC)    Well controlled on last A1c  Recheck a1c Continue current medications UTD on vaccines, eye exam, foot exam On Statin Discussed diet and exercise F/u in 6 months       Relevant Orders   Hemoglobin A1c   Urine Microalbumin w/creat. 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[place])    S/p mastectomy and reconstruction F/b Gen Surg      Recurrent major depressive disorder, in partial remission ([place])    Chronic and well controlled Continue effexor and abilify at current dose      Gout    Chronic and stable She will check on her meds Continue allopurinol Colchicine prn Check uric acid - goal <6      Relevant Orders   Uric acid   Other Visit Diagnoses     Encounter for annual physical exam    -  Primary   Malignant neoplasm of right female breast, unspecified estrogen receptor status, unspecified site of breast ([place])   (Chronic)          No follow-ups on file.     I, Lavon Paganini, MD, have reviewed all documentation for this visit. The documentation on 03/24/21 for the exam, diagnosis, procedures, and orders are all accurate and complete.   , Dionne Bucy, MD, MPH [facility]

## 2021-03-23 NOTE — Telephone Encounter (Signed)
Requested medication (s) are due for refill today: No ? ?Requested medication (s) are on the active medication list: yes   ? ?Last refill: Abilify 02/02/21  #90  0 refills    Venlafaxine  1 /18/23 #90  0 refills ? ?Future visit scheduled yes tomorrow 03/24/21 ? ?Notes to clinic:Abilify not delegated, Venlafaxine failed due to labs. Pt has appt tomorrow 03/24/21. Please review. Thank you. ? ?Requested Prescriptions  ?Pending Prescriptions Disp Refills  ? ARIPiprazole (ABILIFY) 2 MG tablet [Pharmacy Med Name: ARIPIPRAZOLE 2 MG TABLET] 90 tablet 0  ?  Sig: TAKE 1 TABLET BY MOUTH EVERY DAY IN THE EVENING  ?  ? Not Delegated - Psychiatry:  Antipsychotics - Second Generation (Atypical) - aripiprazole Failed - 03/22/2021  7:29 PM  ?  ?  Failed - This refill cannot be delegated  ?  ?  Failed - TSH in normal range and within 360 days  ?  TSH  ?Date Value Ref Range Status  ?03/25/2020 3.060 0.450 - 4.500 uIU/mL Final  ?  ?  ?  ?  Failed - Last BP in normal range  ?  BP Readings from Last 1 Encounters:  ?03/14/21 (!) 138/92  ?  ?  ?  ?  Failed - Lipid Panel in normal range within the last 12 months  ?  Cholesterol, Total  ?Date Value Ref Range Status  ?03/25/2020 220 (H) 100 - 199 mg/dL Final  ? ?LDL Chol Calc (NIH)  ?Date Value Ref Range Status  ?03/25/2020 134 (H) 0 - 99 mg/dL Final  ? ?HDL  ?Date Value Ref Range Status  ?03/25/2020 39 (L) >39 mg/dL Final  ? ?Triglycerides  ?Date Value Ref Range Status  ?03/25/2020 259 (H) 0 - 149 mg/dL Final  ? ?  ?  ?  Failed - CMP within normal limits and completed in the last 12 months  ?  Albumin  ?Date Value Ref Range Status  ?03/25/2020 4.5 3.7 - 4.7 g/dL Final  ? ?Alkaline Phosphatase  ?Date Value Ref Range Status  ?03/25/2020 104 44 - 121 IU/L Final  ? ?ALT  ?Date Value Ref Range Status  ?03/25/2020 57 (H) 0 - 32 IU/L Final  ? ?AST  ?Date Value Ref Range Status  ?03/25/2020 18 0 - 40 IU/L Final  ? ?BUN  ?Date Value Ref Range Status  ?03/25/2020 15 8 - 27 mg/dL Final  ? ?Calcium  ?Date  Value Ref Range Status  ?03/25/2020 9.5 8.7 - 10.3 mg/dL Final  ? ?CO2  ?Date Value Ref Range Status  ?03/25/2020 22 20 - 29 mmol/L Final  ? ?Creatinine, Ser  ?Date Value Ref Range Status  ?03/25/2020 0.81 0.57 - 1.00 mg/dL Final  ? ?Glucose  ?Date Value Ref Range Status  ?03/25/2020 112 (H) 65 - 99 mg/dL Final  ? ?Glucose-Capillary  ?Date Value Ref Range Status  ?09/07/2020 111 (H) 70 - 99 mg/dL Final  ?  Comment:  ?  Glucose reference range applies only to samples taken after fasting for at least 8 hours.  ? ?Potassium  ?Date Value Ref Range Status  ?03/25/2020 4.9 3.5 - 5.2 mmol/L Final  ? ?Sodium  ?Date Value Ref Range Status  ?03/25/2020 143 134 - 144 mmol/L Final  ? ?Bilirubin Total  ?Date Value Ref Range Status  ?03/25/2020 0.2 0.0 - 1.2 mg/dL Final  ? ?Protein, ur  ?Date Value Ref Range Status  ?10/07/2018 NEGATIVE NEGATIVE mg/dL Final  ? ?Total Protein  ?Date Value Ref Range Status  ?  03/25/2020 7.0 6.0 - 8.5 g/dL Final  ? ?GFR calc Af Amer  ?Date Value Ref Range Status  ?08/05/2019 55 (L) >59 mL/min/1.73 Final  ?  Comment:  ?  **[facility] currently reports eGFR in compliance with the current** ?  recommendations of the [employer]. [facility] will ?  update reporting as new guidelines are published from the NKF-ASN ?  Task force. ?  ? ?eGFR  ?Date Value Ref Range Status  ?03/25/2020 73 >59 mL/min/1.73 Final  ? ?GFR calc non Af Amer  ?Date Value Ref Range Status  ?08/05/2019 47 (L) >59 mL/min/1.73 Final  ? ?  ?  ?  Passed - Completed PHQ-2 or PHQ-9 in the last 360 days  ?  ?  Passed - Last Heart Rate in normal range  ?  Pulse Readings from Last 1 Encounters:  ?03/14/21 89  ?  ?  ?  ?  Passed - Valid encounter within last 6 months  ?  Recent Outpatient Visits   ? ?      ? 4 months ago Chronic diarrhea of unknown origin  ? [facility], Kirstie Peri, MD  ? 1 year ago Encounter for annual physical exam  ? [facility], Dionne Bucy, MD  ? 1 year ago Essential  hypertension  ? [facility] [place], Dionne Bucy, MD  ? 2 years ago Encounter for annual physical exam  ? [facility] [place], Dionne Bucy, MD  ? 2 years ago Pulmonary emphysema, unspecified emphysema type ([place])  ? [facility] [place], Dionne Bucy, MD  ? ?  ?  ?Future Appointments   ? ?        ? Tomorrow Bacigalupo, Dionne Bucy, MD [facility]  ? ?  ? ?  ?  ?  Passed - CBC within normal limits and completed in the last 12 months  ?  WBC  ?Date Value Ref Range Status  ?03/03/2021 8.0 4.0 - 10.5 K/uL Final  ? ?RBC  ?Date Value Ref Range Status  ?03/03/2021 4.53 3.87 - 5.11 MIL/uL Final  ? ?Hemoglobin  ?Date Value Ref Range Status  ?03/03/2021 12.7 12.0 - 15.0 g/dL Final  ?09/17/2018 10.4 (L) 11.1 - 15.9 g/dL Final  ? ?HCT  ?Date Value Ref Range Status  ?03/03/2021 39.6 36.0 - 46.0 % Final  ? ?Hematocrit  ?Date Value Ref Range Status  ?09/17/2018 33.0 (L) 34.0 - 46.6 % Final  ? ?MCHC  ?Date Value Ref Range Status  ?03/03/2021 32.1 30.0 - 36.0 g/dL Final  ? ?MCH  ?Date Value Ref Range Status  ?03/03/2021 28.0 26.0 - 34.0 pg Final  ? ?MCV  ?Date Value Ref Range Status  ?03/03/2021 87.4 80.0 - 100.0 fL Final  ?09/17/2018 78 (L) 79 - 97 fL Final  ? ?No results found for: PLTCOUNTKUC, LABPLAT, [place] ?RDW  ?Date Value Ref Range Status  ?03/03/2021 15.2 11.5 - 15.5 % Final  ?09/17/2018 16.5 (H) 11.7 - 15.4 % Final  ? ?  ?  ?  ? Venlafaxine HCl 225 MG TB24 [Pharmacy Med Name: VENLAFAXINE HCL ER 225 MG TAB] 90 tablet 0  ?  Sig: TAKE 1 TABLET BY MOUTH EVERY DAY  ?  ? Psychiatry: Antidepressants - SNRI - desvenlafaxine & venlafaxine Failed - 03/22/2021  7:29 PM  ?  ?  Failed - Cr in normal range and within 360 days  ?  Creatinine, Ser  ?Date Value Ref Range Status  ?03/25/2020 0.81 0.57 -  1.00 mg/dL Final  ?  ?  ?  ?  Failed - Last BP in normal range  ?  BP Readings from Last 1 Encounters:  ?03/14/21 (!) 138/92  ?  ?  ?  ?  Failed - Lipid Panel in normal range within  the last 12 months  ?  Cholesterol, Total  ?Date Value Ref Range Status  ?03/25/2020 220 (H) 100 - 199 mg/dL Final  ? ?LDL Chol Calc (NIH)  ?Date Value Ref Range Status  ?03/25/2020 134 (H) 0 - 99 mg/dL Final  ? ?HDL  ?Date Value Ref Range Status  ?03/25/2020 39 (L) >39 mg/dL Final  ? ?Triglycerides  ?Date Value Ref Range Status  ?03/25/2020 259 (H) 0 - 149 mg/dL Final  ? ?  ?  ?  Passed - Completed PHQ-2 or PHQ-9 in the last 360 days  ?  ?  Passed - Valid encounter within last 6 months  ?  Recent Outpatient Visits   ? ?      ? 4 months ago Chronic diarrhea of unknown origin  ? [facility], Kirstie Peri, MD  ? 1 year ago Encounter for annual physical exam  ? [facility], Dionne Bucy, MD  ? 1 year ago Essential hypertension  ? [facility] [place], Dionne Bucy, MD  ? 2 years ago Encounter for annual physical exam  ? [facility] [place], Dionne Bucy, MD  ? 2 years ago Pulmonary emphysema, unspecified emphysema type ([place])  ? [facility] [place], Dionne Bucy, MD  ? ?  ?  ?Future Appointments   ? ?        ? Tomorrow Bacigalupo, Dionne Bucy, MD [facility]  ? ?  ? ?  ?  ?  ? ? ? ? ?

## 2021-03-24 ENCOUNTER — Encounter: Payer: Self-pay | Admitting: Family Medicine

## 2021-03-24 ENCOUNTER — Other Ambulatory Visit: Payer: Self-pay

## 2021-03-24 ENCOUNTER — Ambulatory Visit: Payer: Medicare PPO | Admitting: Family Medicine

## 2021-03-24 VITALS — BP 133/84 | HR 74 | Temp 97.7°F | Resp 20 | Ht 64.5 in | Wt 190.9 lb

## 2021-03-24 DIAGNOSIS — M1A0710 Idiopathic chronic gout, right ankle and foot, without tophus (tophi): Secondary | ICD-10-CM

## 2021-03-24 DIAGNOSIS — F3341 Major depressive disorder, recurrent, in partial remission: Secondary | ICD-10-CM

## 2021-03-24 DIAGNOSIS — E038 Other specified hypothyroidism: Secondary | ICD-10-CM

## 2021-03-24 DIAGNOSIS — K529 Noninfective gastroenteritis and colitis, unspecified: Secondary | ICD-10-CM

## 2021-03-24 DIAGNOSIS — C50411 Malignant neoplasm of upper-outer quadrant of right female breast: Secondary | ICD-10-CM

## 2021-03-24 DIAGNOSIS — C50911 Malignant neoplasm of unspecified site of right female breast: Secondary | ICD-10-CM

## 2021-03-24 DIAGNOSIS — E1169 Type 2 diabetes mellitus with other specified complication: Secondary | ICD-10-CM

## 2021-03-24 DIAGNOSIS — J439 Emphysema, unspecified: Secondary | ICD-10-CM | POA: Diagnosis not present

## 2021-03-24 DIAGNOSIS — Z6832 Body mass index (BMI) 32.0-32.9, adult: Secondary | ICD-10-CM

## 2021-03-24 DIAGNOSIS — Z0000 Encounter for general adult medical examination without abnormal findings: Secondary | ICD-10-CM

## 2021-03-24 DIAGNOSIS — E785 Hyperlipidemia, unspecified: Secondary | ICD-10-CM

## 2021-03-24 DIAGNOSIS — M109 Gout, unspecified: Secondary | ICD-10-CM | POA: Insufficient documentation

## 2021-03-24 DIAGNOSIS — E1142 Type 2 diabetes mellitus with diabetic polyneuropathy: Secondary | ICD-10-CM | POA: Diagnosis not present

## 2021-03-24 DIAGNOSIS — E1159 Type 2 diabetes mellitus with other circulatory complications: Secondary | ICD-10-CM

## 2021-03-24 DIAGNOSIS — E669 Obesity, unspecified: Secondary | ICD-10-CM | POA: Diagnosis not present

## 2021-03-24 DIAGNOSIS — I152 Hypertension secondary to endocrine disorders: Secondary | ICD-10-CM

## 2021-03-24 NOTE — Assessment & Plan Note (Signed)
Well controlled Continue current medications Recheck metabolic panel F/u in 6 months  

## 2021-03-24 NOTE — Assessment & Plan Note (Signed)
Discussed importance of healthy weight management Discussed diet and exercise  

## 2021-03-24 NOTE — Assessment & Plan Note (Signed)
Upcoming appt with Pulm ?Continues to have SOB ?

## 2021-03-24 NOTE — Assessment & Plan Note (Signed)
Chronic and stable ?She will check on her meds ?Continue allopurinol ?Colchicine prn ?Check uric acid - goal <6 ?

## 2021-03-24 NOTE — Assessment & Plan Note (Signed)
Functional ?Treat by GI with Xifaxin and colestipol ?Improving but not entirely gone ?

## 2021-03-24 NOTE — Assessment & Plan Note (Signed)
Asymptomatic Recheck TSH and free T4 

## 2021-03-24 NOTE — Assessment & Plan Note (Signed)
Well controlled on last A1c  ?Recheck a1c ?Continue current medications ?UTD on vaccines, eye exam, foot exam ?On Statin ?Discussed diet and exercise ?F/u in 6 months  ?

## 2021-03-24 NOTE — Assessment & Plan Note (Signed)
S/p mastectomy and reconstruction ?F/b Gen Surg ?

## 2021-03-24 NOTE — Assessment & Plan Note (Signed)
Chronic and stable ?Consider gabapentin in the future  ?

## 2021-03-24 NOTE — Assessment & Plan Note (Addendum)
Chronic and well controlled ?Continue effexor and abilify at current dose ?

## 2021-03-24 NOTE — Assessment & Plan Note (Signed)
Previously well controlled ?Continue statin and CoQ10 ?Repeat FLP and CMP ?Goal LDL < 70 ?

## 2021-03-27 ENCOUNTER — Encounter: Payer: Self-pay | Admitting: Pulmonary Disease

## 2021-03-27 ENCOUNTER — Other Ambulatory Visit: Payer: Self-pay

## 2021-03-27 ENCOUNTER — Ambulatory Visit: Payer: Medicare PPO | Admitting: Pulmonary Disease

## 2021-03-27 VITALS — BP 120/80 | HR 87 | Temp 97.0°F | Ht 64.5 in | Wt 189.4 lb

## 2021-03-27 DIAGNOSIS — R0600 Dyspnea, unspecified: Secondary | ICD-10-CM | POA: Diagnosis not present

## 2021-03-27 DIAGNOSIS — K219 Gastro-esophageal reflux disease without esophagitis: Secondary | ICD-10-CM

## 2021-03-27 DIAGNOSIS — J984 Other disorders of lung: Secondary | ICD-10-CM

## 2021-03-27 DIAGNOSIS — I7121 Aneurysm of the ascending aorta, without rupture: Secondary | ICD-10-CM

## 2021-03-27 DIAGNOSIS — R011 Cardiac murmur, unspecified: Secondary | ICD-10-CM

## 2021-03-27 MED ORDER — ALBUTEROL SULFATE HFA 108 (90 BASE) MCG/ACT IN AERS
2.0000 | INHALATION_SPRAY | Freq: Four times a day (QID) | RESPIRATORY_TRACT | 2 refills | Status: DC | PRN
Start: 2021-03-27 — End: 2021-05-29

## 2021-03-27 NOTE — Patient Instructions (Addendum)
We will order some breathing tests and a heart test.  We will also do an oxygen level at nighttime. ? ?Your oxygen level remained above 94% when you walk which is good. ? ?We wrote a new prescription for your emergency inhaler (albuterol).  Try using 2 puffs before you do your walk or before any strenuous activity.  You can also use it as needed up to 4 times a day. ? ?See you in follow-up in 3 to 4 weeks time with either me or the nurse practitioner at that time. ? ? ?

## 2021-03-27 NOTE — Progress Notes (Unsigned)
Subjective:    Patient ID: Natasha Vasquez, female    DOB: 1940-05-09, 81 y.o.   MRN: [id number] Patient Care Team: Virginia Crews, MD as PCP - General (Family Medicine) Watt Climes, PA as Physician Assistant (Physician Assistant) Marry Guan, Laurice Record, MD as Consulting Physician (Orthopedic Surgery) Bary Castilla, Forest Gleason, MD (General Surgery) Dasher, Rayvon Char, MD (Dermatology) Grace Isaac, MD (Inactive) as Consulting Physician (Cardiothoracic Surgery) Cammie Sickle, MD as Consulting Physician (Internal Medicine) Leandrew Koyanagi, MD as Referring Physician (Ophthalmology)  Chief Complaint  Patient presents with   Consult   HPI The patient is an 81 year old very remote former smoker presents for evaluation of dyspnea   Review of Systems A 10 point review of systems was performed and it is as noted above otherwise negative.  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  ER/PR NEGATIVE   Cataract    Chronic cough    Complication of anesthesia    20 years ago pts b/p dropped really low, "coded"   Depression    Depression    Diabetes mellitus without complication (HCC)    diet controlled   Family history of adverse reaction to anesthesia    son PONV   GERD (gastroesophageal reflux disease)    Gout    Hypercholesteremia    Hyperlipidemia    Hypertension    Melanoma ([place])    MVP (mitral valve prolapse)    followed by PCP   Nephritis    Nephritis    S/P appendectomy    Thoracic aortic aneurysm    Torn meniscus    left   Past Surgical History:  Procedure Laterality Date   ABDOMINAL HYSTERECTOMY     APPENDECTOMY     BREAST BIOPSY Right 04/25/2017   retroareolar 10:00   wing clip   path pending   BREAST BIOPSY Right 04/25/2017   10:00  5CMFN  venus clip    path pending   BREAST BIOPSY Right  05/03/2017   Affirm Bx- path pending   BREAST CYST ASPIRATION Bilateral    NEG   BREAST EXCISIONAL BIOPSY Left 20+ yrs ago   NEG   BREAST RECONSTRUCTION WITH PLACEMENT OF TISSUE EXPANDER AND FLEX HD (ACELLULAR HYDRATED DERMIS) Right 06/03/2017   Procedure: BREAST RECONSTRUCTION WITH PLACEMENT OF TISSUE EXPANDER AND FLEX HD (ACELLULAR HYDRATED DERMIS);  Surgeon: Wallace Going, DO;  Location: [facility];  Service: Plastics;  Laterality: Right;   BREAST REDUCTION SURGERY Left 11/06/2017   Procedure: BREAST REDUCTION;  Surgeon: Wallace Going, DO;  Location: [facility];  Service: Plastics;  Laterality: Left;   BROW LIFT Bilateral 02/01/2015   Procedure: BLEPHAROPLASTY bilateral upper eyelids.;  Surgeon: Karle Starch, MD;  Location: [place];  Service: Ophthalmology;  Laterality: Bilateral;  DIABETIC - diet controlled   CATARACT EXTRACTION W/PHACO Left 01/14/2019   Procedure: CATARACT EXTRACTION PHACO AND INTRAOCULAR LENS PLACEMENT (IOC) LEFT TECNIS TORIC ADD 5.62 00:46.5 30.0%;  Surgeon: Leandrew Koyanagi, MD;  Location: [place];  Service: Ophthalmology;  Laterality: Left;  diabetic - diet controlled   CATARACT EXTRACTION W/PHACO Right 02/11/2019   Procedure: CATARACT EXTRACTION PHACO AND INTRAOCULAR LENS PLACEMENT (IOC) RIGHT DIABETIC 4.82 00:46.9 10.3%;  Surgeon: Leandrew Koyanagi, MD;  Location: [place];  Service: Ophthalmology;  Laterality:  Right;  Diabetic - diet controlled   CHOLECYSTECTOMY     COLONOSCOPY     COLONOSCOPY WITH PROPOFOL N/A 05/11/2015   Procedure: COLONOSCOPY WITH PROPOFOL;  Surgeon: Manya Silvas, MD;  Location[facility];  Service: Endoscopy;  Laterality: N/A;   COLONOSCOPY WITH PROPOFOL N/A 09/07/2020   Procedure: COLONOSCOPY WITH PROPOFOL;  Surgeon: Toledo, Benay Pike, MD;  Location: [facility];  Service: Gastroenterology;  Laterality: N/A;   ESOPHAGOGASTRODUODENOSCOPY     ESOPHAGOGASTRODUODENOSCOPY (EGD) WITH  PROPOFOL  05/11/2015   Procedure: ESOPHAGOGASTRODUODENOSCOPY (EGD) WITH PROPOFOL;  Surgeon: Manya Silvas, MD;  Location: [facility] END[facility]COPY;  Service: Endoscopy;;   FRACTURE SURGERY Right    arm and shoulder   JOINT REPLACEMENT     KNEE ARTHROSCOPY Left 03/14/2016   Procedure: ARTHROSCOPY KNEE, PARTIAL MEDIAL MENISECTOMY, CHONDROPLASTY;  Surgeon: Dereck Leep, MD;  Location: [facility];  Service: Orthopedics;  Laterality: Left;   MASTECTOMY Right 2019   MASTECTOMY W/ SENTINEL NODE BIOPSY Right 06/03/2017   Procedure: MASTECTOMY WITH SENTINEL LYMPH NODE BIOPSY;  Surgeon: Robert Bellow, MD;  Location: [facility];  Service: General;  Laterality: Right;   PARTIAL HIP ARTHROPLASTY Right    REDUCTION MAMMAPLASTY Left 2019   REMOVAL OF BILATERAL TISSUE EXPANDERS WITH PLACEMENT OF BILATERAL BREAST IMPLANTS Right 11/06/2017   Procedure: REMOVAL OF TISSUE EXPANDER WITH PLACEMENT OF BREAST IMPLANT;  Surgeon: Wallace Going, DO;  Location: [facility];  Service: Plastics;  Laterality: Right;   TONSILLECTOMY     Patient Active Problem List   Diagnosis Date Noted   Recurrent major depressive disorder, in partial remission ([place]) 03/24/2021   Gout 03/24/2021   PAD (peripheral artery disease) ([place]) 06/24/2020   Iron deficiency 10/07/2018   Primary osteoarthritis of left knee 09/17/2018   Emphysema lung ([place]) 03/14/2018   Diabetic peripheral neuropathy associated with type 2 diabetes mellitus ([place]) 03/14/2018   Metatarsalgia of both feet 03/14/2018   Abnormality of lung 03/14/2018   Acquired absence of right breast 06/11/2017   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[place]) 05/09/2017   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   Dysphagia 03/11/2017   Obesity 03/11/2017   Subclinical hypothyroidism 06/08/2015   Osteopenia 06/08/2015   Billowing mitral valve 06/08/2015   Melanoma in situ ([place]) 06/08/2015   Cramps of lower extremity 06/08/2015   Type 2  diabetes mellitus ([place]) 05/18/2015   Chronic diarrhea 05/18/2015   Anemia 05/18/2015   Clinical depression 01/26/2015   Hyperlipidemia associated with type 2 diabetes mellitus ([place]) 10/28/2014   Hypertension associated with diabetes ([place]) 09/27/2014   GERD (gastroesophageal reflux disease) 08/06/2014   Overactive bladder 07/16/2014   Family History  Problem Relation Age of Onset   Cancer Mother        colon/rectal   Colon cancer Mother    Heart disease Father    Hypertension Father    Atrial fibrillation Father    Diabetes Father    Cancer Brother        lung   Healthy Son    Healthy Son    Breast cancer Neg Hx    Social History   Tobacco Use   Smoking status: Former    Types: Cigarettes    Quit date: 1965    Years since quitting: 58.2   Smokeless tobacco: Never   Tobacco comments:    smoked in college  Substance Use Topics   Alcohol use: Yes    Alcohol/week: 7.0 - 14.0 standard drinks  Types: 7 - 14 Glasses of wine per week    Comment: 1-2 glasses of wine per night   Allergies  Allergen Reactions   Codeine Other (See Comments)    Chest pain   Lorazepam Other (See Comments)    "Feels loopy"   Oxycodone Other (See Comments)    MENTAL CHANGES   Paregoric     Chest pain---tolerated MORPHINE   Penicillins Hives    Has patient had a PCN reaction causing immediate rash, facial/tongue/throat swelling, SOB or lightheadedness with hypotension: Yes Has patient had a PCN reaction causing severe rash involving mucus membranes or skin necrosis: No Has patient had a PCN reaction that required hospitalization: NO Has patient had a PCN reaction occurring within the last 10 years: No If all of the above answers are "NO", then may proceed with Cephalosporin use.  Can take Augmentin   Quinolones     PATIENT UNAWARE OF allergy to this class of antibiotics, only aware of allergy to [facility] Patient was warned about not using Cipro and similar antibiotics. Recent studies have  raised concern that fluoroquinolone antibiotics could be associated with an increased risk of aortic aneurysm Fluoroquinolones have non-antimicrobial properties that might jeopardise the integrity of the extracellular matrix of the vascular wall In a  propensity score matched cohort study in [place], there was a 66% increased rate   Aleve [Naproxen Sodium] Hives, Rash and Other (See Comments)    headaches   Current Meds  Medication Sig   acetaminophen (TYLENOL) 500 MG tablet Take 500 mg by mouth every 6 (six) hours as needed.   albuterol (PROVENTIL HFA;VENTOLIN HFA) 108 (90 Base) MCG/ACT inhaler Inhale 2 puffs into the lungs every 6 (six) hours as needed for wheezing or shortness of breath.   allopurinol (ZYLOPRIM) 100 MG tablet TAKE 1 TABLET BY MOUTH EVERY DAY   ARIPiprazole (ABILIFY) 2 MG tablet Take 1 tablet (2 mg total) by mouth daily.   aspirin 81 MG tablet Take 81 mg by mouth daily.   bismuth subsalicylate (PEPTO BISMOL) 262 MG chewable tablet Chew 262 mg by mouth as needed. **capsule**   Bismuth Subsalicylate 063 MG TABS Take 1 tablet by mouth as needed.   calcium carbonate (TUMS - DOSED IN MG ELEMENTAL CALCIUM) 500 MG chewable tablet Chew 1 tablet by mouth as needed for indigestion or heartburn.   clindamycin (CLEOCIN) 150 MG capsule Take 4 capsules by mouth as directed. 1 hour prior to dental procedure   clobetasol ointment (TEMOVATE) 0.05 % APPLY TO AFFECTED AREAS AT NIGHT AS NEEDED   clotrimazole (LOTRIMIN) 1 % cream Apply 1 application topically 2 (two) times daily.   colestipol (COLESTID) 1 g tablet Take 1 g by mouth 2 (two) times daily.   ferrous sulfate 324 MG TBEC Take 324 mg by mouth daily with breakfast.   fluocinonide (LIDEX) 0.05 % external solution APPLY TO AFFECTED AREAS OF SCALP DAILY UNTIL CLEAR, THEN FOR FLARES   lisinopril (ZESTRIL) 5 MG tablet TAKE 1 TABLET BY MOUTH EVERY DAY   MITIGARE 0.6 MG CAPS TAKE TWO PILLS ON FIRST DAY, THEN ONE DAILY   Multiple  Vitamins-Minerals (CENTRUM SILVER PO) Take 1 tablet by mouth daily.   omeprazole (PRILOSEC) 40 MG capsule Take 1 capsule (40 mg total) by mouth 2 (two) times daily.   oxybutynin (DITROPAN-XL) 10 MG 24 hr tablet TAKE 1 TABLET BY MOUTH EVERY DAY   rosuvastatin (CRESTOR) 10 MG tablet TAKE 1 TABLET BY MOUTH EVERY DAY   Venlafaxine HCl 225  MG TB24 TAKE 1 TABLET BY MOUTH EVERY DAY   Immunization History  Administered Date(s) Administered   Fluad Quad(high Dose 65+) 09/09/2018, 10/12/2019   Influenza Split 10/24/2011   Influenza, High Dose Seasonal PF 11/18/2013, 01/24/2016, 10/08/2016, 09/12/2017   Influenza-Unspecified 10/24/2011, 10/14/2012, 11/18/2013   Moderna Sars-Covid-2 Vaccination 01/30/2019, 03/02/2019   Pneumococcal Conjugate-13 08/21/2013   Pneumococcal Polysaccharide-23 01/24/2016   Tdap 10/24/2011   Zoster Recombinat (Shingrix) 04/01/2016, 08/14/2016      Objective:   Physical Exam BP 120/80 (BP Location: Left Arm, Patient Position: Sitting, Cuff Size: Normal)    Pulse 87    Temp (!) 97 F (36.1 C) (Oral)    Ht 5' 4.5" (1.638 m)    Wt 189 lb 6.4 oz (85.9 kg)    SpO2 96%    BMI 32.01 kg/m  GENERAL: Overweight woman, spry, age-appropriate, fully ambulatory, mild conversational dyspnea. HEAD: Normocephalic, atraumatic.  EYES: Pupils equal, round, reactive to light.  No scleral icterus.  MOUTH: Nose/mouth/throat not examined due to masking requirements for COVID 19. NECK: Supple. No thyromegaly. Trachea midline. No JVD.  No adenopathy. PULMONARY: Mild tachypnea, good air entry bilaterally.  No adventitious sounds. CARDIOVASCULAR: S1 and S2. Regular rate and rhythm.  Has a grade 2/6 systolic ejection murmur in the mitral position ABDOMEN: Slightly protuberant otherwise benign MUSCULOSKELETAL: No joint deformity, no clubbing, no edema.  NEUROLOGIC: No overt focal deficit, no gait disturbance, speech is fluent. SKIN: Intact,warm,dry. PSYCH: Mood and behavior  normal.   Representative image from the 07 March 2021 CT chest showing mosaic/groundglass attenuation:      Assessment & Plan:

## 2021-03-28 DIAGNOSIS — E785 Hyperlipidemia, unspecified: Secondary | ICD-10-CM | POA: Diagnosis not present

## 2021-03-28 DIAGNOSIS — E1142 Type 2 diabetes mellitus with diabetic polyneuropathy: Secondary | ICD-10-CM | POA: Diagnosis not present

## 2021-03-28 DIAGNOSIS — M1A0710 Idiopathic chronic gout, right ankle and foot, without tophus (tophi): Secondary | ICD-10-CM | POA: Diagnosis not present

## 2021-03-28 DIAGNOSIS — E1169 Type 2 diabetes mellitus with other specified complication: Secondary | ICD-10-CM | POA: Diagnosis not present

## 2021-03-28 DIAGNOSIS — E038 Other specified hypothyroidism: Secondary | ICD-10-CM | POA: Diagnosis not present

## 2021-03-29 LAB — LIPID PANEL WITH LDL/HDL RATIO
Cholesterol, Total: 257 mg/dL — ABNORMAL HIGH (ref 100–199)
HDL: 41 mg/dL (ref >39)
LDL Chol Calc (NIH): 132 mg/dL — ABNORMAL HIGH (ref 0–99)
LDL/HDL Ratio: 3.2 ratio (ref 0.0–3.2)
Triglycerides: 460 mg/dL — ABNORMAL HIGH (ref 0–149)
VLDL Cholesterol Cal: 84 mg/dL — ABNORMAL HIGH (ref 5–40)

## 2021-03-29 LAB — COMPREHENSIVE METABOLIC PANEL
ALT: 33 IU/L — ABNORMAL HIGH (ref 0–32)
AST: 29 IU/L (ref 0–40)
Albumin/Globulin Ratio: 2 (ref 1.2–2.2)
Albumin: 5 g/dL — ABNORMAL HIGH (ref 3.6–4.6)
Alkaline Phosphatase: 105 IU/L (ref 44–121)
BUN/Creatinine Ratio: 17 (ref 12–28)
BUN: 17 mg/dL (ref 8–27)
Bilirubin Total: 0.4 mg/dL (ref 0.0–1.2)
CO2: 25 mmol/L (ref 20–29)
Calcium: 10.1 mg/dL (ref 8.7–10.3)
Chloride: 98 mmol/L (ref 96–106)
Creatinine, Ser: 1.03 mg/dL — ABNORMAL HIGH (ref 0.57–1.00)
Globulin, Total: 2.5 g/dL (ref 1.5–4.5)
Glucose: 148 mg/dL — ABNORMAL HIGH (ref 70–99)
Potassium: 4.3 mmol/L (ref 3.5–5.2)
Sodium: 140 mmol/L (ref 134–144)
Total Protein: 7.5 g/dL (ref 6.0–8.5)
eGFR: 55 mL/min/{1.73_m2} — ABNORMAL LOW (ref >59)

## 2021-03-29 LAB — HEMOGLOBIN A1C
Est. average glucose Bld gHb Est-mCnc: 128 mg/dL
Hgb A1c MFr Bld: 6.1 % — ABNORMAL HIGH (ref 4.8–5.6)

## 2021-03-29 LAB — TSH+FREE T4
Free T4: 1.07 ng/dL (ref 0.82–1.77)
TSH: 4.05 u[IU]/mL (ref 0.450–4.500)

## 2021-03-29 LAB — MICROALBUMIN / CREATININE URINE RATIO
Creatinine, Urine: 233.4 mg/dL
Microalb/Creat Ratio: 22 mg/g creat (ref 0–29)
Microalbumin, Urine: 51.9 ug/mL

## 2021-03-29 LAB — URIC ACID: Uric Acid: 7 mg/dL (ref 3.1–7.9)

## 2021-04-03 ENCOUNTER — Ambulatory Visit
Admission: RE | Admit: 2021-04-03 | Discharge: 2021-04-03 | Disposition: A | Discharge status: Home / Self Care | Payer: Medicare PPO | Source: Ambulatory Visit | Attending: Pulmonary Disease | Admitting: Pulmonary Disease

## 2021-04-03 DIAGNOSIS — E785 Hyperlipidemia, unspecified: Secondary | ICD-10-CM | POA: Insufficient documentation

## 2021-04-03 DIAGNOSIS — R0609 Other forms of dyspnea: Secondary | ICD-10-CM | POA: Diagnosis not present

## 2021-04-03 DIAGNOSIS — I341 Nonrheumatic mitral (valve) prolapse: Secondary | ICD-10-CM | POA: Insufficient documentation

## 2021-04-03 DIAGNOSIS — I119 Hypertensive heart disease without heart failure: Secondary | ICD-10-CM | POA: Insufficient documentation

## 2021-04-03 DIAGNOSIS — R0600 Dyspnea, unspecified: Secondary | ICD-10-CM

## 2021-04-03 LAB — ECHOCARDIOGRAM COMPLETE
AR max vel: 3.08 cm2
AV Area VTI: 3.8 cm2
AV Area mean vel: 3.5 cm2
AV Mean grad: 4 mmHg
AV Peak grad: 5.8 mmHg
Ao pk vel: 1.21 m/s
Area-P 1/2: 2.4 cm2
MV VTI: 2.77 cm2
S' Lateral: 2 cm

## 2021-04-10 DIAGNOSIS — R0683 Snoring: Secondary | ICD-10-CM | POA: Diagnosis not present

## 2021-04-10 DIAGNOSIS — G4730 Sleep apnea, unspecified: Secondary | ICD-10-CM | POA: Diagnosis not present

## 2021-04-18 ENCOUNTER — Telehealth: Payer: Self-pay

## 2021-04-18 DIAGNOSIS — G4736 Sleep related hypoventilation in conditions classified elsewhere: Secondary | ICD-10-CM

## 2021-04-18 NOTE — Telephone Encounter (Signed)
Based off of ONO patient needs to have in lab sleep study. Per Dr Patsey Berthold. Order was placed for inlab sleep study. Nothing further needed. ?

## 2021-04-20 ENCOUNTER — Ambulatory Visit: Payer: Medicare PPO | Attending: Pulmonary Disease

## 2021-04-20 DIAGNOSIS — R0600 Dyspnea, unspecified: Secondary | ICD-10-CM | POA: Insufficient documentation

## 2021-04-20 MED ORDER — ALBUTEROL SULFATE (2.5 MG/3ML) 0.083% IN NEBU
2.5000 mg | INHALATION_SOLUTION | Freq: Once | RESPIRATORY_TRACT | Status: AC
Start: 2021-04-20 — End: 2021-04-20
  Administered 2021-04-20: 2.5 mg via RESPIRATORY_TRACT
  Filled 2021-04-20: qty 3

## 2021-04-27 ENCOUNTER — Encounter: Payer: Self-pay | Admitting: Pulmonary Disease

## 2021-04-27 ENCOUNTER — Ambulatory Visit: Payer: Medicare PPO | Admitting: Pulmonary Disease

## 2021-04-27 VITALS — BP 150/90 | HR 84 | Temp 98.0°F | Ht 65.5 in | Wt 191.6 lb

## 2021-04-27 DIAGNOSIS — J984 Other disorders of lung: Secondary | ICD-10-CM | POA: Diagnosis not present

## 2021-04-27 DIAGNOSIS — G479 Sleep disorder, unspecified: Secondary | ICD-10-CM | POA: Diagnosis not present

## 2021-04-27 DIAGNOSIS — R0989 Other specified symptoms and signs involving the circulatory and respiratory systems: Secondary | ICD-10-CM | POA: Diagnosis not present

## 2021-04-27 DIAGNOSIS — K219 Gastro-esophageal reflux disease without esophagitis: Secondary | ICD-10-CM

## 2021-04-27 DIAGNOSIS — R0600 Dyspnea, unspecified: Secondary | ICD-10-CM

## 2021-04-27 NOTE — Patient Instructions (Signed)
We have made a referral to cardiology. ? ?You will be having a sleep study performed this has been sent to be scheduled. ? ?We have also scheduled studies to check your carotids. ? ?We will see you in follow-up in 6 to 8 weeks time call sooner should any new problems arise. ?

## 2021-04-27 NOTE — Progress Notes (Signed)
? ?Subjective:  ? ? Patient ID: Natasha Vasquez, female    DOB: [birth year], 81 y.o.   MRN: [id number] ?Patient Care Team: ?Virginia Crews, MD as PCP - General (Family Medicine) ?Watt Climes, PA as Physician Assistant (Physician Assistant) ?Hooten, Laurice Record, MD as Consulting Physician (Orthopedic Surgery) ?Robert Bellow, MD (General Surgery) ?Dasher, Rayvon Char, MD (Dermatology) ?Cammie Sickle, MD as Consulting Physician (Internal Medicine) ?Leandrew Koyanagi, MD as Referring Physician (Ophthalmology) ?Tyler Pita, MD as Consulting Physician (Pulmonary Disease) ?Melrose Nakayama, MD as Consulting Physician (Cardiothoracic Surgery) ? ?Chief Complaint  ?Patient presents with  ? Follow-up  ?  F/u on PFT, Echo, ONO  ? ?HPI ?She is an 81 year old very remote former smoker with minimal tobacco history, who presents for follow-up on the issue of dyspnea and abnormal CT scan of the chest with nonspecific groundglass opacities.  At her prior visit we requested PFTs, 2D echo and overnight oximetry.  Overnight oximetry that show significant oxygen desaturations with a abnormal desaturation graph pattern.  Patient will need an in-house split-night study.  The patient is being scheduled for this.  Her pulmonary function testing was normal.  2D echo showed LVEF of 60 to 65%, no wall motion abnormalities, moderate LVH and grade I DD, no significant mitral regurg noted.  Dilatation of the aortic root measuring 39 mm (known). ? ?Patient continues to have issues with shortness of breath and now notes that she also has some dizziness.  Examination today does confirm carotid bruits. ? ?She has not used albuterol as prescribed earlier for shortness of breath.  Continues to have same symptoms except now does note some dizziness from time to time.  Does not endorse any new symptomatology from the previously noted.  No significant cough since her last visit.  No hemoptysis.  No orthopnea, paroxysmal nocturnal  dyspnea or lower extremity edema. ? ?Review of Systems ?A 10 point review of systems was performed and it is as noted above otherwise negative. ? ?Patient Active Problem List  ? Diagnosis Date Noted  ? Aneurysm of ascending aorta without rupture ([place]) 05/10/2021  ? CAP (community acquired pneumonia) 05/04/2021  ? HTN (hypertension) 05/04/2021  ? HLD (hyperlipidemia) 05/04/2021  ? Depression 05/04/2021  ? Elevated troponin 05/04/2021  ? Sepsis ([place]) 05/04/2021  ? Diabetes mellitus without complication ([place]) 68/03/2120  ? Chronic diastolic CHF (congestive heart failure) ([place]) 05/04/2021  ? Recurrent major depressive disorder, in partial remission ([place]) 03/24/2021  ? Gout 03/24/2021  ? PAD (peripheral artery disease) ([place]) 06/24/2020  ? Iron deficiency 10/07/2018  ? Primary osteoarthritis of left knee 09/17/2018  ? Emphysema lung ([place]) 03/14/2018  ? Diabetic peripheral neuropathy associated with type 2 diabetes mellitus ([place]) 03/14/2018  ? Metatarsalgia of both feet 03/14/2018  ? Abnormality of lung 03/14/2018  ? Acquired absence of right breast 06/11/2017  ? 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[place]) 05/09/2017  ? 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  ? Dysphagia 03/11/2017  ? Obesity 03/11/2017  ? Subclinical hypothyroidism 06/08/2015  ? Osteopenia 06/08/2015  ? Billowing mitral valve 06/08/2015  ? Melanoma in situ ([place]) 06/08/2015  ? Cramps of lower extremity 06/08/2015  ? Type 2 diabetes mellitus ([place]) 05/18/2015  ? Chronic diarrhea 05/18/2015  ? Anemia 05/18/2015  ? Clinical depression 01/26/2015  ? Hyperlipidemia associated with type 2 diabetes mellitus ([place]) 10/28/2014  ? Hypertension associated with diabetes ([place]) 09/27/2014  ? GERD (gastroesophageal reflux disease) 08/06/2014  ? Overactive bladder  07/16/2014  ? ?Social History  ? ?Tobacco Use  ? Smoking status: Former  ?  Types: Cigarettes  ?  Quit date: 25  ?  Years since quitting: 58.3  ? Smokeless tobacco:  Never  ? Tobacco comments:  ?  smoked in college  ?Substance Use Topics  ? Alcohol use: Yes  ?  Alcohol/week: 7.0 - 14.0 standard drinks  ?  Types: 7 - 14 Glasses of wine per week  ?  Comment: 1-2 glasses of wine per night  ? ?Allergies  ?Allergen Reactions  ? Codeine Other (See Comments)  ?  Chest pain  ? Lorazepam Other (See Comments)  ?  "Feels loopy"  ? Oxycodone Other (See Comments)  ?  MENTAL CHANGES  ? Paregoric   ?  Chest pain---tolerated MORPHINE  ? Penicillins Hives  ?  Has patient had a PCN reaction causing immediate rash, facial/tongue/throat swelling, SOB or lightheadedness with hypotension: Yes ?Has patient had a PCN reaction causing severe rash involving mucus membranes or skin necrosis: No ?Has patient had a PCN reaction that required hospitalization: NO ?Has patient had a PCN reaction occurring within the last 10 years: No ?If all of the above answers are "NO", then may proceed with Cephalosporin use. ? ?Can take Augmentin  ? Quinolones   ?  PATIENT UNAWARE OF allergy to this class of antibiotics, only aware of allergy to PCNs ?Patient was warned about not using Cipro and similar antibiotics. ?Recent studies have raised concern that fluoroquinolone antibiotics could be associated with an increased risk of aortic aneurysm ?Fluoroquinolones have non-antimicrobial properties that might jeopardise the integrity of the extracellular matrix of the vascular wall ?In a  propensity score matched cohort study in [place], there was a 66% increased rate  ? Aleve [Naproxen Sodium] Hives, Rash and Other (See Comments)  ?  headaches  ? ?Current Meds  ?Medication Sig  ? acetaminophen (TYLENOL) 500 MG tablet Take 500 mg by mouth every 6 (six) hours as needed.  ? albuterol (VENTOLIN HFA) 108 (90 Base) MCG/ACT inhaler Inhale 2 puffs into the lungs every 6 (six) hours as needed for wheezing or shortness of breath.  ? allopurinol (ZYLOPRIM) 100 MG tablet TAKE 1 TABLET BY MOUTH EVERY DAY  ? ARIPiprazole (ABILIFY) 2 MG  tablet Take 1 tablet (2 mg total) by mouth daily.  ? aspirin 81 MG tablet Take 81 mg by mouth daily.  ? bismuth subsalicylate (PEPTO BISMOL) 262 MG chewable tablet Chew 262 mg by mouth as needed. **capsule**  ? calcium carbonate (TUMS - DOSED IN MG ELEMENTAL CALCIUM) 500 MG chewable tablet Chew 1 tablet by mouth as needed for indigestion or heartburn.  ? clobetasol ointment (TEMOVATE) 0.05 % APPLY TO AFFECTED AREAS AT NIGHT AS NEEDED  ? clotrimazole (LOTRIMIN) 1 % cream Apply 1 application topically 2 (two) times daily.  ? colestipol (COLESTID) 1 g tablet Take 1 g by mouth 2 (two) times daily.  ? ferrous sulfate 324 MG TBEC Take 324 mg by mouth daily with breakfast.  ? fluocinonide (LIDEX) 0.05 % external solution APPLY TO AFFECTED AREAS OF SCALP DAILY UNTIL CLEAR, THEN FOR FLARES  ? lisinopril (ZESTRIL) 5 MG tablet TAKE 1 TABLET BY MOUTH EVERY DAY  ? Multiple Vitamins-Minerals (CENTRUM SILVER PO) Take 1 tablet by mouth daily.  ? omeprazole (PRILOSEC) 40 MG capsule Take 1 capsule (40 mg total) by mouth 2 (two) times daily.  ? oxybutynin (DITROPAN-XL) 10 MG 24 hr tablet TAKE 1 TABLET BY MOUTH EVERY DAY  ?  rosuvastatin (CRESTOR) 10 MG tablet TAKE 1 TABLET BY MOUTH EVERY DAY  ? Venlafaxine HCl 225 MG TB24 TAKE 1 TABLET BY MOUTH EVERY DAY  ? [DISCONTINUED] Bismuth Subsalicylate 290 MG TABS Take 1 tablet by mouth as needed. (Patient not taking: Reported on 05/04/2021)  ? [DISCONTINUED] clindamycin (CLEOCIN) 150 MG capsule Take 4 capsules by mouth as directed. 1 hour prior to dental procedure  ? [DISCONTINUED] MITIGARE 0.6 MG CAPS TAKE TWO PILLS ON FIRST DAY, THEN ONE DAILY (Patient not taking: Reported on 05/04/2021)  ? ?Immunization History  ?Administered Date(s) Administered  ? Fluad Quad(high Dose 65+) 09/09/2018, 10/12/2019, 10/14/2020  ? Influenza Split 10/24/2011  ? Influenza, High Dose Seasonal PF 11/18/2013, 01/24/2016, 10/08/2016, 09/12/2017  ? Influenza-Unspecified 10/24/2011, 10/14/2012, 11/18/2013  ? Moderna  Sars-Covid-2 Vaccination 01/30/2019, 03/02/2019  ? Pneumococcal Conjugate-13 08/21/2013  ? Pneumococcal Polysaccharide-23 01/24/2016  ? Tdap 10/24/2011  ? Zoster Recombinat (Shingrix) 04/01/2016, 08/14/2016  ?

## 2021-04-28 ENCOUNTER — Ambulatory Visit
Admission: RE | Admit: 2021-04-28 | Discharge: 2021-04-28 | Disposition: A | Discharge status: Home / Self Care | Payer: Medicare PPO | Source: Ambulatory Visit | Attending: Pulmonary Disease | Admitting: Pulmonary Disease

## 2021-04-28 DIAGNOSIS — R0989 Other specified symptoms and signs involving the circulatory and respiratory systems: Secondary | ICD-10-CM | POA: Insufficient documentation

## 2021-04-28 DIAGNOSIS — I6522 Occlusion and stenosis of left carotid artery: Secondary | ICD-10-CM | POA: Diagnosis not present

## 2021-05-01 NOTE — Telephone Encounter (Signed)
Dr. Gonzalez, please advise. Thanks 

## 2021-05-01 NOTE — Telephone Encounter (Signed)
The "groundglass" appearance in the lung is nonspecific meaning that there is no specific issue that causes it.  More than likely this is related to fluid.  At times this is related to the heart other times to the kidneys etc.  Her heart function appeared good.  She does have however calcifications of her arteries going to the heart that is the reason why we are referring her to cardiology.  May be other things can be revealed at that time.  There is nothing specific to do until we know what is causing it.  It does not appear to be originating from the lungs per se. ?

## 2021-05-04 ENCOUNTER — Inpatient Hospital Stay: Payer: Medicare PPO

## 2021-05-04 ENCOUNTER — Inpatient Hospital Stay
Admission: EM | Admit: 2021-05-04 | Discharge: 2021-05-08 | DRG: 871 | Disposition: A | Discharge status: Home / Self Care | Payer: Medicare PPO | Attending: Internal Medicine | Admitting: Internal Medicine

## 2021-05-04 ENCOUNTER — Ambulatory Visit: Payer: Self-pay

## 2021-05-04 ENCOUNTER — Other Ambulatory Visit: Payer: Self-pay

## 2021-05-04 ENCOUNTER — Emergency Department: Payer: Medicare PPO

## 2021-05-04 DIAGNOSIS — R053 Chronic cough: Secondary | ICD-10-CM | POA: Diagnosis present

## 2021-05-04 DIAGNOSIS — R7989 Other specified abnormal findings of blood chemistry: Secondary | ICD-10-CM | POA: Diagnosis present

## 2021-05-04 DIAGNOSIS — I10 Essential (primary) hypertension: Secondary | ICD-10-CM | POA: Diagnosis present

## 2021-05-04 DIAGNOSIS — Z8249 Family history of ischemic heart disease and other diseases of the circulatory system: Secondary | ICD-10-CM

## 2021-05-04 DIAGNOSIS — Z87442 Personal history of urinary calculi: Secondary | ICD-10-CM

## 2021-05-04 DIAGNOSIS — A419 Sepsis, unspecified organism: Principal | ICD-10-CM | POA: Diagnosis present

## 2021-05-04 DIAGNOSIS — F32A Depression, unspecified: Secondary | ICD-10-CM | POA: Diagnosis present

## 2021-05-04 DIAGNOSIS — I712 Thoracic aortic aneurysm, without rupture, unspecified: Secondary | ICD-10-CM | POA: Diagnosis not present

## 2021-05-04 DIAGNOSIS — I5032 Chronic diastolic (congestive) heart failure: Secondary | ICD-10-CM | POA: Diagnosis not present

## 2021-05-04 DIAGNOSIS — K2270 Barrett's esophagus without dysplasia: Secondary | ICD-10-CM | POA: Diagnosis present

## 2021-05-04 DIAGNOSIS — Z833 Family history of diabetes mellitus: Secondary | ICD-10-CM

## 2021-05-04 DIAGNOSIS — E119 Type 2 diabetes mellitus without complications: Secondary | ICD-10-CM | POA: Diagnosis present

## 2021-05-04 DIAGNOSIS — Z85820 Personal history of malignant melanoma of skin: Secondary | ICD-10-CM

## 2021-05-04 DIAGNOSIS — Z20822 Contact with and (suspected) exposure to covid-19: Secondary | ICD-10-CM | POA: Diagnosis not present

## 2021-05-04 DIAGNOSIS — Z885 Allergy status to narcotic agent status: Secondary | ICD-10-CM

## 2021-05-04 DIAGNOSIS — I700 Atherosclerosis of aorta: Secondary | ICD-10-CM | POA: Diagnosis not present

## 2021-05-04 DIAGNOSIS — K219 Gastro-esophageal reflux disease without esophagitis: Secondary | ICD-10-CM | POA: Diagnosis present

## 2021-05-04 DIAGNOSIS — M19041 Primary osteoarthritis, right hand: Secondary | ICD-10-CM | POA: Diagnosis present

## 2021-05-04 DIAGNOSIS — E039 Hypothyroidism, unspecified: Secondary | ICD-10-CM | POA: Diagnosis present

## 2021-05-04 DIAGNOSIS — I110 Hypertensive heart disease with heart failure: Secondary | ICD-10-CM | POA: Diagnosis present

## 2021-05-04 DIAGNOSIS — Z86000 Personal history of in-situ neoplasm of breast: Secondary | ICD-10-CM | POA: Diagnosis not present

## 2021-05-04 DIAGNOSIS — Z9049 Acquired absence of other specified parts of digestive tract: Secondary | ICD-10-CM

## 2021-05-04 DIAGNOSIS — Z8679 Personal history of other diseases of the circulatory system: Secondary | ICD-10-CM

## 2021-05-04 DIAGNOSIS — R778 Other specified abnormalities of plasma proteins: Secondary | ICD-10-CM | POA: Diagnosis present

## 2021-05-04 DIAGNOSIS — M19042 Primary osteoarthritis, left hand: Secondary | ICD-10-CM | POA: Diagnosis present

## 2021-05-04 DIAGNOSIS — E7800 Pure hypercholesterolemia, unspecified: Secondary | ICD-10-CM | POA: Diagnosis present

## 2021-05-04 DIAGNOSIS — Z7982 Long term (current) use of aspirin: Secondary | ICD-10-CM

## 2021-05-04 DIAGNOSIS — Z79899 Other long term (current) drug therapy: Secondary | ICD-10-CM

## 2021-05-04 DIAGNOSIS — I341 Nonrheumatic mitral (valve) prolapse: Secondary | ICD-10-CM | POA: Diagnosis present

## 2021-05-04 DIAGNOSIS — I248 Other forms of acute ischemic heart disease: Secondary | ICD-10-CM | POA: Diagnosis present

## 2021-05-04 DIAGNOSIS — E785 Hyperlipidemia, unspecified: Secondary | ICD-10-CM | POA: Diagnosis present

## 2021-05-04 DIAGNOSIS — R0602 Shortness of breath: Secondary | ICD-10-CM | POA: Diagnosis not present

## 2021-05-04 DIAGNOSIS — Z881 Allergy status to other antibiotic agents status: Secondary | ICD-10-CM

## 2021-05-04 DIAGNOSIS — Z87891 Personal history of nicotine dependence: Secondary | ICD-10-CM

## 2021-05-04 DIAGNOSIS — J189 Pneumonia, unspecified organism: Secondary | ICD-10-CM | POA: Diagnosis present

## 2021-05-04 DIAGNOSIS — N3281 Overactive bladder: Secondary | ICD-10-CM | POA: Diagnosis present

## 2021-05-04 DIAGNOSIS — M109 Gout, unspecified: Secondary | ICD-10-CM | POA: Diagnosis present

## 2021-05-04 DIAGNOSIS — Z90710 Acquired absence of both cervix and uterus: Secondary | ICD-10-CM

## 2021-05-04 DIAGNOSIS — R042 Hemoptysis: Secondary | ICD-10-CM | POA: Diagnosis not present

## 2021-05-04 DIAGNOSIS — Z886 Allergy status to analgesic agent status: Secondary | ICD-10-CM

## 2021-05-04 DIAGNOSIS — Z9882 Breast implant status: Secondary | ICD-10-CM

## 2021-05-04 DIAGNOSIS — J168 Pneumonia due to other specified infectious organisms: Secondary | ICD-10-CM | POA: Diagnosis not present

## 2021-05-04 DIAGNOSIS — Z880 Allergy status to penicillin: Secondary | ICD-10-CM

## 2021-05-04 DIAGNOSIS — Z888 Allergy status to other drugs, medicaments and biological substances status: Secondary | ICD-10-CM

## 2021-05-04 LAB — BASIC METABOLIC PANEL
Anion gap: 10 (ref 5–15)
BUN: 13 mg/dL (ref 8–23)
CO2: 25 mmol/L (ref 22–32)
Calcium: 9.2 mg/dL (ref 8.9–10.3)
Chloride: 101 mmol/L (ref 98–111)
Creatinine, Ser: 0.97 mg/dL (ref 0.44–1.00)
GFR, Estimated: 59 mL/min — ABNORMAL LOW (ref >60)
Glucose, Bld: 140 mg/dL — ABNORMAL HIGH (ref 70–99)
Potassium: 4.2 mmol/L (ref 3.5–5.1)
Sodium: 136 mmol/L (ref 135–145)

## 2021-05-04 LAB — CBC
HCT: 37.3 % (ref 36.0–46.0)
Hemoglobin: 12 g/dL (ref 12.0–15.0)
MCH: 27.2 pg (ref 26.0–34.0)
MCHC: 32.2 g/dL (ref 30.0–36.0)
MCV: 84.6 fL (ref 80.0–100.0)
Platelets: 315 10*3/uL (ref 150–400)
RBC: 4.41 MIL/uL (ref 3.87–5.11)
RDW: 15.8 % — ABNORMAL HIGH (ref 11.5–15.5)
WBC: 18.2 10*3/uL — ABNORMAL HIGH (ref 4.0–10.5)
nRBC: 0 % (ref 0.0–0.2)

## 2021-05-04 LAB — LIPID PANEL
Cholesterol: 189 mg/dL (ref 0–200)
HDL: 40 mg/dL — ABNORMAL LOW (ref >40)
LDL Cholesterol: 104 mg/dL — ABNORMAL HIGH (ref 0–99)
Total CHOL/HDL Ratio: 4.7 RATIO
Triglycerides: 224 mg/dL — ABNORMAL HIGH (ref <150)
VLDL: 45 mg/dL — ABNORMAL HIGH (ref 0–40)

## 2021-05-04 LAB — HEMOGLOBIN A1C
Hgb A1c MFr Bld: 6.2 % — ABNORMAL HIGH (ref 4.8–5.6)
Mean Plasma Glucose: 131.24 mg/dL

## 2021-05-04 LAB — BRAIN NATRIURETIC PEPTIDE: B Natriuretic Peptide: 88 pg/mL (ref 0.0–100.0)

## 2021-05-04 LAB — RESP PANEL BY RT-PCR (FLU A&B, COVID) ARPGX2
Influenza A by PCR: NEGATIVE
Influenza B by PCR: NEGATIVE
SARS Coronavirus 2 by RT PCR: NEGATIVE

## 2021-05-04 LAB — TROPONIN I (HIGH SENSITIVITY)
Troponin I (High Sensitivity): 20 ng/L — ABNORMAL HIGH (ref <18)
Troponin I (High Sensitivity): 20 ng/L — ABNORMAL HIGH (ref <18)
Troponin I (High Sensitivity): 17 ng/L (ref <18)

## 2021-05-04 LAB — PROCALCITONIN: Procalcitonin: 0.13 ng/mL

## 2021-05-04 LAB — LACTIC ACID, PLASMA: Lactic Acid, Venous: 1.7 mmol/L (ref 0.5–1.9)

## 2021-05-04 LAB — STREP PNEUMONIAE URINARY ANTIGEN: Strep Pneumo Urinary Antigen: NEGATIVE

## 2021-05-04 MED ORDER — CALCIUM CARBONATE ANTACID 500 MG PO CHEW: 1.0000 | CHEWABLE_TABLET | ORAL | Status: DC | PRN

## 2021-05-04 MED ORDER — ALLOPURINOL 100 MG PO TABS
200.0000 mg | ORAL_TABLET | Freq: Every day | ORAL | Status: DC
Start: 2021-05-04 — End: 2021-05-08
  Administered 2021-05-04 – 2021-05-07 (×4): 200 mg via ORAL
  Filled 2021-05-04 (×4): qty 2

## 2021-05-04 MED ORDER — FERROUS SULFATE 325 (65 FE) MG PO TABS
325.0000 mg | ORAL_TABLET | Freq: Every day | ORAL | Status: DC
  Administered 2021-05-05 – 2021-05-08 (×4): 325 mg via ORAL
  Filled 2021-05-04 (×4): qty 1

## 2021-05-04 MED ORDER — COLESTIPOL HCL 1 G PO TABS
1.0000 g | ORAL_TABLET | Freq: Two times a day (BID) | ORAL | Status: DC
  Administered 2021-05-04 – 2021-05-08 (×8): 1 g via ORAL
  Filled 2021-05-04 (×8): qty 1

## 2021-05-04 MED ORDER — SODIUM CHLORIDE 0.9 % IV SOLN
1.0000 g | Freq: Once | INTRAVENOUS | Status: AC
  Administered 2021-05-04: 1 g via INTRAVENOUS
  Filled 2021-05-04: qty 10

## 2021-05-04 MED ORDER — DM-GUAIFENESIN ER 30-600 MG PO TB12: 1.0000 | ORAL_TABLET | Freq: Two times a day (BID) | ORAL | Status: DC | PRN

## 2021-05-04 MED ORDER — OXYBUTYNIN CHLORIDE ER 10 MG PO TB24
10.0000 mg | ORAL_TABLET | Freq: Two times a day (BID) | ORAL | Status: DC
  Administered 2021-05-04 – 2021-05-08 (×8): 10 mg via ORAL
  Filled 2021-05-04 (×8): qty 1

## 2021-05-04 MED ORDER — IOHEXOL 350 MG/ML SOLN
75.0000 mL | Freq: Once | INTRAVENOUS | Status: AC | PRN
  Administered 2021-05-04: 75 mL via INTRAVENOUS
  Filled 2021-05-04: qty 75

## 2021-05-04 MED ORDER — ALBUTEROL SULFATE (2.5 MG/3ML) 0.083% IN NEBU
2.5000 mg | INHALATION_SOLUTION | RESPIRATORY_TRACT | Status: DC | PRN
Start: 2021-05-04 — End: 2021-05-08
  Administered 2021-05-07: 2.5 mg via RESPIRATORY_TRACT
  Filled 2021-05-04: qty 3

## 2021-05-04 MED ORDER — LISINOPRIL 10 MG PO TABS
5.0000 mg | ORAL_TABLET | Freq: Every day | ORAL | Status: DC
  Administered 2021-05-05 – 2021-05-08 (×4): 5 mg via ORAL
  Filled 2021-05-04 (×4): qty 1

## 2021-05-04 MED ORDER — PANTOPRAZOLE SODIUM 40 MG PO TBEC
40.0000 mg | DELAYED_RELEASE_TABLET | Freq: Every day | ORAL | Status: DC
  Administered 2021-05-04 – 2021-05-05 (×2): 40 mg via ORAL
  Filled 2021-05-04 (×2): qty 1

## 2021-05-04 MED ORDER — METHYLPREDNISOLONE SODIUM SUCC 40 MG IJ SOLR
40.0000 mg | Freq: Every day | INTRAMUSCULAR | Status: DC
  Administered 2021-05-04 – 2021-05-06 (×3): 40 mg via INTRAVENOUS
  Filled 2021-05-04 (×3): qty 1

## 2021-05-04 MED ORDER — ONDANSETRON HCL 4 MG/2ML IJ SOLN: 4.0000 mg | Freq: Three times a day (TID) | INTRAMUSCULAR | Status: DC | PRN

## 2021-05-04 MED ORDER — VENLAFAXINE HCL ER 75 MG PO CP24
225.0000 mg | ORAL_CAPSULE | Freq: Every day | ORAL | Status: DC
  Administered 2021-05-04 – 2021-05-08 (×5): 225 mg via ORAL
  Filled 2021-05-04 (×5): qty 3

## 2021-05-04 MED ORDER — ARIPIPRAZOLE 2 MG PO TABS
2.0000 mg | ORAL_TABLET | Freq: Every day | ORAL | Status: DC
Start: 2021-05-04 — End: 2021-05-05
  Administered 2021-05-04: 2 mg via ORAL
  Filled 2021-05-04 (×2): qty 1

## 2021-05-04 MED ORDER — SODIUM CHLORIDE 0.9 % IV SOLN
1.0000 g | INTRAVENOUS | Status: DC
  Administered 2021-05-05 – 2021-05-08 (×4): 1 g via INTRAVENOUS
  Filled 2021-05-04 (×2): qty 10
  Filled 2021-05-04: qty 1
  Filled 2021-05-04 (×2): qty 10

## 2021-05-04 MED ORDER — SODIUM CHLORIDE 0.9 % IV SOLN
500.0000 mg | INTRAVENOUS | Status: DC
  Administered 2021-05-05: 500 mg via INTRAVENOUS
  Filled 2021-05-04: qty 5
  Filled 2021-05-04: qty 500

## 2021-05-04 MED ORDER — ACETAMINOPHEN 325 MG PO TABS
650.0000 mg | ORAL_TABLET | Freq: Four times a day (QID) | ORAL | Status: DC | PRN
Start: 2021-05-04 — End: 2021-05-08

## 2021-05-04 MED ORDER — HYDRALAZINE HCL 20 MG/ML IJ SOLN: 5.0000 mg | INTRAMUSCULAR | Status: DC | PRN

## 2021-05-04 MED ORDER — ROSUVASTATIN CALCIUM 10 MG PO TABS
10.0000 mg | ORAL_TABLET | Freq: Every day | ORAL | Status: DC
  Administered 2021-05-05 – 2021-05-08 (×4): 10 mg via ORAL
  Filled 2021-05-04 (×4): qty 1

## 2021-05-04 MED ORDER — IPRATROPIUM-ALBUTEROL 0.5-2.5 (3) MG/3ML IN SOLN
3.0000 mL | RESPIRATORY_TRACT | Status: DC
Start: 2021-05-04 — End: 2021-05-05
  Administered 2021-05-04 – 2021-05-05 (×2): 3 mL via RESPIRATORY_TRACT
  Filled 2021-05-04 (×2): qty 3

## 2021-05-04 MED ORDER — BISMUTH SUBSALICYLATE 262 MG PO CHEW: 262.0000 mg | CHEWABLE_TABLET | ORAL | Status: DC | PRN

## 2021-05-04 MED ORDER — SODIUM CHLORIDE 0.9 % IV SOLN
500.0000 mg | Freq: Once | INTRAVENOUS | Status: AC
Start: 2021-05-04 — End: 2021-05-04
  Administered 2021-05-04: 500 mg via INTRAVENOUS
  Filled 2021-05-04: qty 5

## 2021-05-04 MED ORDER — ADULT MULTIVITAMIN W/MINERALS CH
1.0000 | ORAL_TABLET | Freq: Every day | ORAL | Status: DC
  Administered 2021-05-05 – 2021-05-08 (×4): 1 via ORAL
  Filled 2021-05-04 (×4): qty 1

## 2021-05-04 NOTE — Assessment & Plan Note (Signed)
-   Continue home medications 

## 2021-05-04 NOTE — ED Provider Notes (Signed)
? ?[facility] ?Provider Note ? ? ? Event Date/Time  ? First MD Initiated Contact with Patient 05/04/21 1209   ?  (approximate) ? ? ?History  ? ?Hemoptysis ? ? ?HPI ? ?Natasha Vasquez is a 81 y.o. female  who, per pulmonology note dated 04/27/21 has history of dyspnea, who presents to the emergency department today because of concerns for coughing up blood.  Patient states she started developing cough yesterday.  Today however she noticed some blood in her sputum.  She denies any significant associated chest pain with this.  She denies any fevers.  Patient denies similar symptoms in the past.  She does state that she irritated her throat that is what caused the hemoptysis. ? ?Physical Exam  ? ?Triage Vital Signs: ?ED Triage Vitals  ?Enc Vitals Group  ?   BP 05/04/21 1205 (!) 163/90  ?   Pulse Rate 05/04/21 1205 87  ?   Resp 05/04/21 1205 (!) 24  ?   Temp 05/04/21 1205 99.6 ?F (37.6 ?C)  ?   Temp Source 05/04/21 1205 Oral  ?   SpO2 05/04/21 1205 92 %  ?   Weight 05/04/21 1149 190 lb (86.2 kg)  ?   Height 05/04/21 1149 5' 5.5" (1.664 m)  ?   Head Circumference --   ?   Peak Flow --   ?   Pain Score 05/04/21 1149 0  ? ?Most recent vital signs: ?Vitals:  ? 05/04/21 1205  ?BP: (!) 163/90  ?Pulse: 87  ?Resp: (!) 24  ?Temp: 99.6 ?F (37.6 ?C)  ?SpO2: 92%  ? ? ?General: Awake, alert and oriented. ?CV:  Good peripheral perfusion. Regular rate and rhythm. ?Resp:  Rhonchi in left lung. ?Abd:  No distention. Non tender to palpation.  ? ?ED Results / Procedures / Treatments  ? ?Labs ?(all labs ordered are listed, but only abnormal results are displayed) ?Labs Reviewed  ?BASIC METABOLIC PANEL - Abnormal; Notable for the following components:  ?    Result Value  ? Glucose, Bld 140 (*)   ? GFR, Estimated 59 (*)   ? All other components within normal limits  ?CBC - Abnormal; Notable for the following components:  ? WBC 18.2 (*)   ? RDW 15.8 (*)   ? All other components within normal limits  ?TROPONIN I (HIGH  SENSITIVITY) - Abnormal; Notable for the following components:  ? Troponin I (High Sensitivity) 20 (*)   ? All other components within normal limits  ?RESP PANEL BY RT-PCR (FLU A&B, COVID) [id number]  ?CULTURE, BLOOD (ROUTINE X 2)  ?CULTURE, BLOOD (ROUTINE X 2)  ?EXPECTORATED SPUTUM ASSESSMENT W GRAM STAIN, RFLX TO RESP C  ?LACTIC ACID, PLASMA  ?BRAIN NATRIURETIC PEPTIDE  ?LIPID PANEL  ?PROCALCITONIN  ?STREP PNEUMONIAE URINARY ANTIGEN  ?LEGIONELLA PNEUMOPHILA SEROGP 1 UR AG  ?HEMOGLOBIN A1C  ?TROPONIN I (HIGH SENSITIVITY)  ? ? ? ?EKG ? ?INance Pear, attending physician, personally viewed and interpreted this EKG ? ?EKG Time: 1200 ?Rate: 97 ?Rhythm: sinus rhythm ?Axis: left axis deviation ?Intervals: qtc 470 ?QRS: LVH ?ST changes: no st elevation ?Impression: abnormal ekg ? ? ? ?RADIOLOGY ?I independently interpreted and visualized the CXR. My interpretation: Left sided airspace opacity ?Radiology interpretation:  ?IMPRESSION:  ?New ill-defined airspace opacity in left midlung, which may be due  ?to pneumonia or hemorrhage. Recommend continued chest radiographic  ?follow-up to confirm resolution.  ? ?PROCEDURES: ? ?Critical Care performed: No ? ?Procedures ? ? ?MEDICATIONS ORDERED IN ED: ?  Medications - No data to display ? ? ?IMPRESSION / MDM / ASSESSMENT AND PLAN / ED COURSE  ?I reviewed the triage vital signs and the nursing notes. ?             ?               ? ?Differential diagnosis includes, but is not limited to, PE, bronchiectasis, infection. ? ?Patient presents to the emergency department today because of concerns for coughing up blood.  Work-up here is consistent with pneumonia.  Patient was found to have leukocytosis and blood work and pneumonia on x-ray.  I did discuss this finding with the patient.  Did start IV antibiotics.  Discussed with Dr. Blaine Hamper with the hospitalist service who will plan on admission. ? ? ?FINAL CLINICAL IMPRESSION(S) / ED DIAGNOSES  ? ?Final diagnoses:  ?Pneumonia due to  infectious organism, unspecified laterality, unspecified part of lung  ? ? ? ?Note:  This document was prepared using Dragon voice recognition software and may include unintentional dictation errors. ? ?  ?Nance Pear, MD ?05/04/21 1443 ? ?

## 2021-05-04 NOTE — Assessment & Plan Note (Signed)
-   Crestor 

## 2021-05-04 NOTE — Telephone Encounter (Signed)
?  Chief Complaint: Coughing up blood ?Symptoms: Blood in sputum ?Frequency: Since this morning ?Pertinent Negatives: Patient denies  ?Disposition: '[x]'$ ED /'[]'$ Urgent Care (no appt availability in office) / '[]'$ Appointment(In office/virtual)/ '[]'$  [facility]/ '[]'$ Home Care/ '[]'$ Refused Recommended Disposition /'[]'$ [facility]/ '[]'$  Follow-up with PCP ?Additional Notes: Pt has ongoing lung issues and coughed a lot last night. Today pt also has low grade fever. Per Jiles Garter no appts availble. Recommended ED. ?Reason for Disposition ? [1] Coughed up blood AND [2] > 1 tablespoon (15 ml)  (Exception: Blood-tinged sputum.) ? ?Answer Assessment - Initial Assessment Questions ?1. ONSET: "When did the cough begin?"  ?    Cough for a while  - got worse overnight. ?2. SEVERITY: "How bad is the cough today?" "Did the blood appear after a coughing spell?"  ?    yes ?3. SPUTUM: "Describe the color of your sputum" (none, dry cough; clear, white, yellow, green) ?    salvia ?4. HEMOPTYSIS: "How much blood?" (flecks, streaks, tablespoons, etc.) ?    Streaks - 3 napkins - since this morning ?5. DIFFICULTY BREATHING: "Are you having difficulty breathing?" If Yes, ask: "How bad is it?" (e.g., mild, moderate, severe)  ?  - MILD: No SOB at rest, mild SOB with walking, speaks normally in sentences, can lie down, no retractions, pulse < 100.  ?  - MODERATE: SOB at rest, SOB with minimal exertion and prefers to sit, cannot lie down flat, speaks in phrases, mild retractions, audible wheezing, pulse 100-120.  ?  - SEVERE: Very SOB at rest, speaks in single words, struggling to breathe, sitting hunched forward, retractions, pulse > 120  ?    moderate ?6. FEVER: "Do you have a fever?" If Yes, ask: "What is your temperature, how was it measured, and when did it start?" ?    100.4 ?7. CARDIAC HISTORY: "Do you have any history of heart disease?" (e.g., heart attack, congestive heart failure)  ?    yes ?8. LUNG HISTORY: "Do you have any  history of lung disease?"  (e.g., pulmonary embolus, asthma, emphysema) ?    yes ?9. PE RISK FACTORS: "Do you have a history of blood clots?" (or: recent major surgery, recent prolonged travel, bedridden) ?    no ?10. OTHER SYMPTOMS: "Do you have any other symptoms?" (e.g., runny nose, wheezing, chest pain) ?      Upper resp wheezing ?11. PREGNANCY: "Is there any chance you are pregnant?" "When was your last menstrual period?" ?      na ?12. TRAVEL: "Have you traveled out of the country in the last month?" (e.g., travel history, exposures) ?      na ? ?Protocols used: Coughing Up Blood-A-AH ? ?

## 2021-05-04 NOTE — Assessment & Plan Note (Addendum)
Sepsis due to CAP: pt has sepsis with WBC 18.2, heart rate 91, RR 24.  Lactic acid is normal 1.7.  CT angiograms negative for PE, but  showed infiltration in right upper lobe and lingular lobe.  Due to hemoptysis, consulted Dr. Lanney Gins of pulmonology. ? ? ?- Will admit to tele med-surg bed as inpt ?- IV Rocephin and azithromycin ?-Solu-Medrol 40 mg daily ?- Mucinex for cough  ?- Bronchodilators ?- Urine legionella and S. pneumococcal antigen ?- Follow up blood culture x2, sputum culture ?- will get Procalcitonin ?- IVF: will not give IVF due to normal lactic acid level ?

## 2021-05-04 NOTE — Assessment & Plan Note (Signed)
Continue allopurinol 

## 2021-05-04 NOTE — Telephone Encounter (Signed)
Noted  

## 2021-05-04 NOTE — Assessment & Plan Note (Signed)
2D echo on 04/03/2021 showed EF of 60 to 65% with grade 1 diastolic dysfunction.  No pulm edema on chest x-ray.  BNP normal 88.  Patient does not seem to have CHF exacerbation. ?-Watch volume status closely ?

## 2021-05-04 NOTE — H&P (Addendum)
?History and Physical  ? ? ?Natasha Vasquez INO:[id number] DOB: 1940-05-11 DOA: 05/04/2021 ? ?Referring MD/NP/PA:  ? ?PCP: Virginia Crews, MD  ? ?Patient coming from:  The patient is coming from home.  At baseline, pt is independent for most of ADL.       ? ?Chief Complaint: SOB ? ?HPI: Natasha Vasquez is a 81 y.o. female with medical history significant of hypertension, hyperlipidemia, diet controlled diabetes, GERD, hypothyroidism, gout, depression, thoracic aortic aneurysm, kidney stone, right breast DCIS, Bell's palsy, Barrett's esophagus, PAD, overactive bladder, who presents with shortness of breath. ? ?Patient states that she has chronic shortness of breath for several months, which has worsened recently. She has cough with little mucus production.  No chest pain.  She states that he has low-grade fever of 100.1 at 1 time, no chills.  She states that she coughed up some bright red-colored blood this morning.  Patient does not have nausea, vomiting, diarrhea or abdominal pain.  No symptoms of UTI. Of note,  ? ?Of note, pt had CTA by CT surgeon on 03/07/21 due to hx of thoracic aortic aneurysm and was found to have groundglass appearance of lung. She was given referral to pulmonologist, Dr. Patsey Berthold.  Per Dr. Domingo Dimes note, this is nonspecific finding, could be related to fluid.  She had 2D echo on 04/03/2021 which showed EF of 60 to 65% with grade 1 diastolic dysfunction. ? ?Data Reviewed and ED Course: pt was found to have WBC 18.2, troponin level 20 --> 20, lactic acid 1.7, negative COVID PCR, creatinine 0.97, BUN 13, temperature 99.6, blood pressure 161/89, heart rate 91, RR 24, oxygen saturation 91-92% on room air.  Chest x-ray showed opacity in the left middle lobe due to possible infiltration versus hemorrhage.  Patient is admitted to telemetry bed as inpatient. Dr. Lanney Gins of pulmonology is consulted. ? ?CTA: ?LEFT upper lobe and lingular pneumonia. ?  ?Negative for pulmonary embolism. ?   ?Stable appearance of an approximately 4.4 cm thoracic aortic ?aneurysm accounting for some motion on the current study, within 1 ?mm of the previous exam when measured in the coronal plane. ?Recommend annual imaging followup by CTA or MRA. This recommendation ?follows 2010 ACCF/AHA/AATS/ACR/ASA/SCA/SCAI/SIR/STS/SVM Guidelines ?for the Diagnosis and Management of Patients with Thoracic Aortic ?Disease. Circulation. 2010; 121: S962-E366. Aortic aneurysm NOS ?(ICD10-I71.9) ?  ?Aortic atherosclerosis. ?  ?Hepatic steatosis. ?  ?Aortic Atherosclerosis (ICD10-I70.0). ? ? ?EKG: I have personally reviewed.  LVH, sinus rhythm, QTc 470, LAD, poor R wave progression, T wave inversion in lead I/aVL ? ?Review of Systems:  ? ?General: no fevers, chills, no body weight gain, has fatigue ?HEENT: no blurry vision, hearing changes or sore throat ?Respiratory: has dyspnea, coughing, no wheezing ?CV: no chest pain, no palpitations ?GI: no nausea, vomiting, abdominal pain, diarrhea, constipation ?GU: no dysuria, burning on urination, increased urinary frequency, hematuria  ?Ext: no leg edema ?Neuro: no unilateral weakness, numbness, or tingling, no vision change or hearing loss ?Skin: no rash, no skin tear. ?MSK: No muscle spasm, no deformity, no limitation of range of movement in spin ?Heme: No easy bruising.  ?Travel history: No recent long distant travel. ? ? ?Allergy:  ?Allergies  ?Allergen Reactions  ? Codeine Other (See Comments)  ?  Chest pain  ? Lorazepam Other (See Comments)  ?  "Feels loopy"  ? Oxycodone Other (See Comments)  ?  MENTAL CHANGES  ? Paregoric   ?  Chest pain---tolerated MORPHINE  ? Penicillins Hives  ?  Has patient had a PCN reaction causing immediate rash, facial/tongue/throat swelling, SOB or lightheadedness with hypotension: Yes ?Has patient had a PCN reaction causing severe rash involving mucus membranes or skin necrosis: No ?Has patient had a PCN reaction that required hospitalization: NO ?Has patient had  a PCN reaction occurring within the last 10 years: No ?If all of the above answers are "NO", then may proceed with Cephalosporin use. ? ?Can take Augmentin  ? Quinolones   ?  PATIENT UNAWARE OF allergy to this class of antibiotics, only aware of allergy to PCNs ?Patient was warned about not using Cipro and similar antibiotics. ?Recent studies have raised concern that fluoroquinolone antibiotics could be associated with an increased risk of aortic aneurysm ?Fluoroquinolones have non-antimicrobial properties that might jeopardise the integrity of the extracellular matrix of the vascular wall ?In a  propensity score matched cohort study in [place], there was a 66% increased rate  ? Aleve [Naproxen Sodium] Hives, Rash and Other (See Comments)  ?  headaches  ? ? ?Past Medical History:  ?Diagnosis Date  ? Anemia in pregnancy   ? Arthritis   ? hands  ? Barrett esophagus   ? Bell palsy 04/24/2004  ? Breast neoplasm, Tis (DCIS), right 06/03/2017  ? 36 mm area DCIS.  ER/PR NEGATIVE  ? Cataract   ? Chronic cough   ? Complication of anesthesia   ? 20 years ago pts b/p dropped really low, "coded"  ? Depression   ? Depression   ? Diabetes mellitus without complication ([place])   ? diet controlled  ? Family history of adverse reaction to anesthesia   ? son PONV  ? GERD (gastroesophageal reflux disease)   ? Gout   ? Hypercholesteremia   ? Hyperlipidemia   ? Hypertension   ? Melanoma ([place])   ? MVP (mitral valve prolapse)   ? followed by PCP  ? Nephritis   ? Nephritis   ? S/P appendectomy   ? Thoracic aortic aneurysm ([place])   ? Torn meniscus   ? left  ? ? ?Past Surgical History:  ?Procedure Laterality Date  ? ABDOMINAL HYSTERECTOMY    ? APPENDECTOMY    ? BREAST BIOPSY Right 04/25/2017  ? retroareolar 10:00   wing clip   path pending  ? BREAST BIOPSY Right 04/25/2017  ? 10:00  5CMFN  venus clip    path pending  ? BREAST BIOPSY Right 05/03/2017  ? Affirm Bx- path pending  ? BREAST CYST ASPIRATION Bilateral   ? NEG  ? BREAST EXCISIONAL  BIOPSY Left 20+ yrs ago  ? NEG  ? BREAST RECONSTRUCTION WITH PLACEMENT OF TISSUE EXPANDER AND FLEX HD (ACELLULAR HYDRATED DERMIS) Right 06/03/2017  ? Procedure: BREAST RECONSTRUCTION WITH PLACEMENT OF TISSUE EXPANDER AND FLEX HD (ACELLULAR HYDRATED DERMIS);  Surgeon: Wallace Going, DO;  Location: [facility];  Service: Plastics;  Laterality: Right;  ? BREAST REDUCTION SURGERY Left 11/06/2017  ? Procedure: BREAST REDUCTION;  Surgeon: Wallace Going, DO;  Location: [facility];  Service: Plastics;  Laterality: Left;  ? BROW LIFT Bilateral 02/01/2015  ? Procedure: BLEPHAROPLASTY bilateral upper eyelids.;  Surgeon: Karle Starch, MD;  Location: [place];  Service: Ophthalmology;  Laterality: Bilateral;  DIABETIC - diet controlled  ? CATARACT EXTRACTION W/PHACO Left 01/14/2019  ? Procedure: CATARACT EXTRACTION PHACO AND INTRAOCULAR LENS PLACEMENT (IOC) LEFT TECNIS TORIC ADD 5.62 00:46.5 30.0%;  Surgeon: Leandrew Koyanagi, MD;  Location: [place];  Service: Ophthalmology;  Laterality: Left;  diabetic - diet  controlled  ? CATARACT EXTRACTION W/PHACO Right 02/11/2019  ? Procedure: CATARACT EXTRACTION PHACO AND INTRAOCULAR LENS PLACEMENT (IOC) RIGHT DIABETIC 4.82 00:46.9 10.3%;  Surgeon: Leandrew Koyanagi, MD;  Location: [place];  Service: Ophthalmology;  Laterality: Right;  Diabetic - diet controlled  ? CHOLECYSTECTOMY    ? COLONOSCOPY    ? COLONOSCOPY WITH PROPOFOL N/A 05/11/2015  ? Procedure: COLONOSCOPY WITH PROPOFOL;  Surgeon: Manya Silvas, MD;  Location: [facility];  Service: Endoscopy;  Laterality: N/A;  ? COLONOSCOPY WITH PROPOFOL N/A 09/07/2020  ? Procedure: COLONOSCOPY WITH PROPOFOL;  Surgeon: Toledo, Benay Pike, MD;  Location: [facility] ENDOSCOPY;  Service: Gastroenterology;  Laterality: N/A;  ? ESOPHAGOGASTRODUODENOSCOPY    ? ESOPHAGOGASTRODUODENOSCOPY (EGD) WITH PROPOFOL  05/11/2015  ? Procedure: ESOPHAGOGASTRODUODENOSCOPY (EGD) WITH PROPOFOL;  Surgeon: Manya Silvas,  MD;  Location: [facility] ENDOSCOPY;  Service: Endoscopy;;  ? FRACTURE SURGERY Right   ? arm and shoulder  ? JOINT REPLACEMENT    ? KNEE ARTHROSCOPY Left 03/14/2016  ? Procedure: ARTHROSCOPY KNEE, PARTIAL MEDIAL MENIS

## 2021-05-04 NOTE — Assessment & Plan Note (Signed)
See above

## 2021-05-04 NOTE — ED Triage Notes (Signed)
Pt c/o having a cough since last night and started coughing up some bloody sputum. Having SOB , denies having any pain.. pt is ambulatory with a steady gait. ?

## 2021-05-04 NOTE — Assessment & Plan Note (Signed)
Troponin level 20--> 20.  No chest pain.  Most likely due to demand ischemia ?-Hold aspirin due to hemoptysis ?-Crestor ?-Trend troponin  ?-Check A1c, FLP ?

## 2021-05-04 NOTE — Assessment & Plan Note (Signed)
-   IV hydralazine as needed ?-Continue lisinopril ?

## 2021-05-05 DIAGNOSIS — J189 Pneumonia, unspecified organism: Secondary | ICD-10-CM | POA: Diagnosis not present

## 2021-05-05 LAB — CBC
HCT: 37.4 % (ref 36.0–46.0)
Hemoglobin: 11.7 g/dL — ABNORMAL LOW (ref 12.0–15.0)
MCH: 27.3 pg (ref 26.0–34.0)
MCHC: 31.3 g/dL (ref 30.0–36.0)
MCV: 87.4 fL (ref 80.0–100.0)
Platelets: 290 10*3/uL (ref 150–400)
RBC: 4.28 MIL/uL (ref 3.87–5.11)
RDW: 15.9 % — ABNORMAL HIGH (ref 11.5–15.5)
WBC: 12.7 10*3/uL — ABNORMAL HIGH (ref 4.0–10.5)
nRBC: 0 % (ref 0.0–0.2)

## 2021-05-05 LAB — GLUCOSE, CAPILLARY: Glucose-Capillary: 161 mg/dL — ABNORMAL HIGH (ref 70–99)

## 2021-05-05 MED ORDER — ARIPIPRAZOLE 2 MG PO TABS
2.0000 mg | ORAL_TABLET | Freq: Every day | ORAL | Status: DC
Start: 2021-05-05 — End: 2021-05-08
  Administered 2021-05-05 – 2021-05-07 (×3): 2 mg via ORAL
  Filled 2021-05-05 (×3): qty 1

## 2021-05-05 MED ORDER — PANTOPRAZOLE SODIUM 40 MG PO TBEC
40.0000 mg | DELAYED_RELEASE_TABLET | Freq: Two times a day (BID) | ORAL | Status: DC
  Administered 2021-05-05 – 2021-05-08 (×6): 40 mg via ORAL
  Filled 2021-05-05 (×6): qty 1

## 2021-05-05 MED ORDER — IPRATROPIUM-ALBUTEROL 0.5-2.5 (3) MG/3ML IN SOLN
3.0000 mL | Freq: Four times a day (QID) | RESPIRATORY_TRACT | Status: DC
  Administered 2021-05-05 (×3): 3 mL via RESPIRATORY_TRACT
  Filled 2021-05-05 (×3): qty 3

## 2021-05-05 MED ORDER — AZITHROMYCIN 250 MG PO TABS
500.0000 mg | ORAL_TABLET | Freq: Every day | ORAL | Status: DC
Start: 2021-05-06 — End: 2021-05-08
  Administered 2021-05-06 – 2021-05-08 (×3): 500 mg via ORAL
  Filled 2021-05-05 (×3): qty 2

## 2021-05-05 NOTE — Progress Notes (Signed)
?PROGRESS NOTE ? ? ? ?Natasha Vasquez  RAQ:[id number] DOB: [birth year] DOA: 05/04/2021 ?PCP: Virginia Crews, MD  ? ? ?Brief Narrative:  ?81 y.o. female with medical history significant of hypertension, hyperlipidemia, diet controlled diabetes, GERD, hypothyroidism, gout, depression, thoracic aortic aneurysm, kidney stone, right breast DCIS, Bell's palsy, Barrett's esophagus, PAD, overactive bladder, who presents with shortness of breath. ?  ?Patient states that she has chronic shortness of breath for several months, which has worsened recently. She has cough with little mucus production.  No chest pain.  She states that he has low-grade fever of 100.1 at 1 time, no chills.  She states that she coughed up some bright red-colored blood this morning.  Patient does not have nausea, vomiting, diarrhea or abdominal pain.  No symptoms of UTI. Of note,  ?  ?Of note, pt had CTA by CT surgeon on 03/07/21 due to hx of thoracic aortic aneurysm and was found to have groundglass appearance of lung. She was given referral to pulmonologist, Dr. Patsey Berthold.  Per Dr. Domingo Dimes note, this is nonspecific finding, could be related to fluid.  She had 2D echo on 04/03/2021 which showed EF of 60 to 65% with grade 1 diastolic dysfunction. ? ?Hemoglobin stable.  No further episodes of hemoptysis. ? ? ?Assessment & Plan: ?  ?Principal Problem: ?  CAP (community acquired pneumonia) ?Active Problems: ?  Sepsis ([place]) ?  Gout ?  HTN (hypertension) ?  HLD (hyperlipidemia) ?  Depression ?  Elevated troponin ?  Chronic diastolic CHF (congestive heart failure) ([place]) ? ?CAP (community acquired pneumonia) ?Sepsis due to CAP ?Small-volume hemoptysis ?pt has sepsis with WBC 18.2, heart rate 91, RR 24.  Lactic acid is normal 1.7.   ?CT angiograms negative for PE, but  showed infiltration in right upper lobe and lingular lobe. Due to hemoptysis, consulted Dr. Lanney Gins of pulmonology. ?Plan: ?Continue IV steroids for today ?De-escalate to prednisone  tomorrow ?IV azithromycin and Rocephin ?Bronchodilators/mucolytic's ?Follow infectious panel ?Monitor vitals and fever curve ?Appreciate pulmonology recommendations ?  ?Sepsis ([place]) ?See above ?  ?Chronic diastolic CHF (congestive heart failure) ([place]) ?2D echo on 04/03/2021 showed EF of 60 to 65% with grade 1 diastolic dysfunction.   ?No pulm edema on chest x-ray.  BNP normal 88.   ?Patient does not seem to have CHF exacerbation. ?-Watch volume status closely ?  ?Elevated troponin ?Troponin level 20--> 20.  No chest pain.   ?Most likely due to demand ischemia ?-Hold aspirin due to hemoptysis ?-Crestor ?  ?Depression ?- Continue home medications ?  ?HLD (hyperlipidemia) ?- Crestor ?  ?HTN (hypertension) ?- IV hydralazine as needed ?-Continue lisinopril ?  ?Gout ?- Continue allopurinol ? ? ?DVT prophylaxis: SCD ?Code Status: Full ?Family Communication: Husband via phone 4/21 ?Disposition Plan: Status is: Inpatient ?Remains inpatient appropriate because: CAP with associated small-volume hemoptysis ? ? ?Level of care: Med-Surg ? ?Consultants:  ?Pulmonology ? ?Procedures:  ?None ? ?Antimicrobials: ?Rocephin ?Azithromycin ? ? ?Subjective: ?Patient seen and examined.  Hemodynamically stable.  On 2 L nasal cannula.  Speaking in complete sentences.  No further hemoptysis. ? ?Objective: ?Vitals:  ? 05/05/21 0220 05/05/21 0722 05/05/21 0748 05/05/21 1037  ?BP: (!) 156/105 (!) 142/89    ?Pulse: 78 77    ?Resp: 18     ?Temp: 98 ?F (36.7 ?C) 97.6 ?F (36.4 ?C)    ?TempSrc:      ?SpO2: 97% 93% 95% 91%  ?Weight:      ?Height:      ? ? ?  Intake/Output Summary (Last 24 hours) at 05/05/2021 1300 ?Last data filed at 05/05/2021 1044 ?Gross per 24 hour  ?Intake 667.49 ml  ?Output --  ?Net 667.49 ml  ? ?Filed Weights  ? 05/04/21 1149  ?Weight: 86.2 kg  ? ? ?Examination: ? ?General exam: Appears calm and comfortable  ?Respiratory system: Scattered crackles bilaterally, worse on left, normal work of breathing, 2 L ?Cardiovascular system: S1-S2,  RRR, no murmurs, no pedal edema ?Gastrointestinal system: Soft, NT/ND, normal bowel sounds ?Central nervous system: Alert and oriented. No focal neurological deficits. ?Extremities: Symmetric 5 x 5 power. ?Skin: No rashes, lesions or ulcers ?Psychiatry: Judgement and insight appear normal. Mood & affect appropriate.  ? ? ? ?Data Reviewed: I have personally reviewed following labs and imaging studies ? ?CBC: ?Recent Labs  ?Lab 05/04/21 ?1213 05/05/21 ?3151  ?WBC 18.2* 12.7*  ?HGB 12.0 11.7*  ?HCT 37.3 37.4  ?MCV 84.6 87.4  ?PLT 315 290  ? ?Basic Metabolic Panel: ?Recent Labs  ?Lab 05/04/21 ?1213  ?NA 136  ?K 4.2  ?CL 101  ?CO2 25  ?GLUCOSE 140*  ?BUN 13  ?CREATININE 0.97  ?CALCIUM 9.2  ? ?GFR: ?Estimated Creatinine Clearance: 49.8 mL/min (by C-G formula based on SCr of 0.97 mg/dL). ?Liver Function Tests: ?No results for input(s): AST, ALT, ALKPHOS, BILITOT, PROT, ALBUMIN in the last 168 hours. ?No results for input(s): LIPASE, AMYLASE in the last 168 hours. ?No results for input(s): AMMONIA in the last 168 hours. ?Coagulation Profile: ?No results for input(s): INR, PROTIME in the last 168 hours. ?Cardiac Enzymes: ?No results for input(s): CKTOTAL, CKMB, CKMBINDEX, TROPONINI in the last 168 hours. ?BNP (last 3 results) ?No results for input(s): PROBNP in the last 8760 hours. ?HbA1C: ?Recent Labs  ?  05/04/21 ?1448  ?HGBA1C 6.2*  ? ?CBG: ?Recent Labs  ?Lab 05/05/21 ?0721  ?GLUCAP 161*  ? ?Lipid Profile: ?Recent Labs  ?  05/04/21 ?1413  ?CHOL 189  ?HDL 40*  ?LDLCALC 104*  ?TRIG 224*  ?CHOLHDL 4.7  ? ?Thyroid Function Tests: ?No results for input(s): TSH, T4TOTAL, FREET4, T3FREE, THYROIDAB in the last 72 hours. ?Anemia Panel: ?No results for input(s): VITAMINB12, FOLATE, FERRITIN, TIBC, IRON, RETICCTPCT in the last 72 hours. ?Sepsis Labs: ?Recent Labs  ?Lab 05/04/21 ?1257 05/04/21 ?1413  ?PROCALCITON  --  0.13  ?LATICACIDVEN 1.7  --   ? ? ?Recent Results (from the past 240 hour(s))  ?Resp Panel by RT-PCR (Flu A&B, Covid)  Nasopharyngeal Swab     Status: None  ? Collection Time: 05/04/21 12:57 PM  ? Specimen: Nasopharyngeal Swab; Nasopharyngeal(NP) swabs in vial transport medium  ?Result Value Ref Range Status  ? SARS Coronavirus 2 by RT PCR NEGATIVE NEGATIVE Final  ?  Comment: (NOTE) ?SARS-CoV-2 target nucleic acids are NOT DETECTED. ? ?The SARS-CoV-2 RNA is generally detectable in upper respiratory ?specimens during the acute phase of infection. The lowest ?concentration of SARS-CoV-2 viral copies this assay can detect is ?138 copies/mL. A negative result does not preclude SARS-Cov-2 ?infection and should not be used as the sole basis for treatment or ?other patient management decisions. A negative result may occur with  ?improper specimen collection/handling, submission of specimen other ?than nasopharyngeal swab, presence of viral mutation(s) within the ?areas targeted by this assay, and inadequate number of viral ?copies(<138 copies/mL). A negative result must be combined with ?clinical observations, patient history, and epidemiological ?information. The expected result is Negative. ? ?Fact Sheet for Patients:  ?EntrepreneurPulse.com.au ? ?Fact Sheet for Healthcare Providers:  ?IncredibleEmployment.be ? ?  This test is no t yet approved or cleared by the [place] FDA and  ?has been authorized for detection and/or diagnosis of SARS-CoV-2 by ?FDA under an Emergency Use Authorization (EUA). This EUA will remain  ?in effect (meaning this test can be used) for the duration of the ?COVID-19 declaration under Section 564(b)(1) of the Act, 21 ?U.S.C.section 360bbb-3(b)(1), unless the authorization is terminated  ?or revoked sooner.  ? ? ?  ? Influenza A by PCR NEGATIVE NEGATIVE Final  ? Influenza B by PCR NEGATIVE NEGATIVE Final  ?  Comment: (NOTE) ?The Xpert Xpress SARS-CoV-2/FLU/RSV plus assay is intended as an aid ?in the diagnosis of influenza from Nasopharyngeal swab specimens and ?should not be  used as a sole basis for treatment. Nasal washings and ?aspirates are unacceptable for Xpert Xpress SARS-CoV-2/FLU/RSV ?testing. ? ?Fact Sheet for Patients: ?EntrepreneurPulse.com.au ? ?

## 2021-05-05 NOTE — Plan of Care (Signed)
?  Problem: Activity: ?Goal: Ability to tolerate increased activity will improve ?Outcome: Progressing ?Goal: Will verbalize the importance of balancing activity with adequate rest periods ?Outcome: Progressing ?  ?Problem: Respiratory: ?Goal: Ability to maintain a clear airway will improve ?Outcome: Progressing ?Goal: Levels of oxygenation will improve ?Outcome: Progressing ?Goal: Ability to maintain adequate ventilation will improve ?Outcome: Progressing ?  ?Problem: Activity: ?Goal: Ability to tolerate increased activity will improve ?Outcome: Progressing ?  ?Problem: Clinical Measurements: ?Goal: Ability to maintain a body temperature in the normal range will improve ?Outcome: Progressing ?  ?Pt is involved in and agrees with the plan of care. Alert and oriented. V/S stable. Still on oxygen at 2lpm/[place] with sats at 97%. Dyspnea on exertion noted. No complaints of pain.  ?

## 2021-05-05 NOTE — Progress Notes (Signed)
PHARMACIST - PHYSICIAN COMMUNICATION ?DR:   Priscella Mann ?CONCERNING: Antibiotic IV to Oral Route Change Policy ? ?RECOMMENDATION: ?This patient is receiving Azithromycin by the intravenous route.  Based on criteria approved by the Pharmacy and Therapeutics Committee, the antibiotic(s) is/are being converted to the equivalent oral dose form(s). ? ? ?DESCRIPTION: ?These criteria include: ?Patient being treated for a respiratory tract infection, urinary tract infection, cellulitis or clostridium difficile associated diarrhea if on metronidazole ?The patient is not neutropenic and does not exhibit a GI malabsorption state ?The patient is eating (either orally or via tube) and/or has been taking other orally administered medications for a least 24 hours ?The patient is improving clinically and has a Tmax < 100.5 ? ?If you have questions about this conversion, please contact the Pharmacy Department  ?'[]'$   [phone] )  [facility] ?'[]'$   [phone] )  [facility]  ?'[]'$   [phone] )  [facility] ?'[]'$   [phone] )  [facility]  ? ?Eleaner Dibartolo Rodriguez-Guzman PharmD, BCPS ?05/05/2021 3:30 PM ? ?

## 2021-05-05 NOTE — Progress Notes (Signed)
Order received from Dr Priscella Mann to let the telemetry expire ?

## 2021-05-05 NOTE — TOC Initial Note (Signed)
Transition of Care (TOC) - Initial/Assessment Note  ? ? ?Patient Details  ?Name: Natasha Vasquez ?MRN: [id number] ?Date of Birth: 24-Mar-1940 ? ?Transition of Care (TOC) CM/SW Contact:    ?Laurena Slimmer, RN ?Phone Number: ?05/05/2021, 9:19 AM ? ?Clinical Narrative:                 ? ?Transition of Care (TOC) Screening Note ? ? ?Patient Details  ?Name: Natasha Vasquez ?Date of Birth: 19-May-1940 ? ? ?Transition of Care (TOC) CM/SW Contact:    ?Laurena Slimmer, RN ?Phone Number: ?05/05/2021, 9:19 AM ? ? ? ?Transition of Care Department [facility]) has reviewed patient and no TOC needs have been identified at this time. We will continue to monitor patient advancement through interdisciplinary progression rounds. If new patient transition needs arise, please place a TOC consult. ?  ? ?  ?  ? ? ?Patient Goals and CMS Choice ?  ?  ?  ? ?Expected Discharge Plan and Services ?  ?  ?  ?  ?  ?                ?  ?  ?  ?  ?  ?  ?  ?  ?  ?  ? ?Prior Living Arrangements/Services ?  ?  ?  ?       ?  ?  ?  ?  ? ?Activities of Daily Living ?Home Assistive Devices/Equipment: Grab bars in shower, Grab bars around toilet ?ADL Screening (condition at time of admission) ?Patient's cognitive ability adequate to safely complete daily activities?: Yes ?Is the patient deaf or have difficulty hearing?: No ?Does the patient have difficulty seeing, even when wearing glasses/contacts?: No ?Does the patient have difficulty concentrating, remembering, or making decisions?: No ?Patient able to express need for assistance with ADLs?: Yes ?Does the patient have difficulty dressing or bathing?: No ?Independently performs ADLs?: Yes (appropriate for developmental age) ?Does the patient have difficulty walking or climbing stairs?: No ?Weakness of Legs: None ?Weakness of Arms/Hands: None ? ?Permission Sought/Granted ?  ?  ?   ?   ?   ?   ? ?Emotional Assessment ?  ?  ?  ?  ?  ?  ? ?Admission diagnosis:  CAP (community acquired pneumonia) [J18.9] ?Pneumonia due to  infectious organism, unspecified laterality, unspecified part of lung [J18.9] ?Patient Active Problem List  ? Diagnosis Date Noted  ? CAP (community acquired pneumonia) 05/04/2021  ? HTN (hypertension) 05/04/2021  ? HLD (hyperlipidemia) 05/04/2021  ? Depression 05/04/2021  ? Elevated troponin 05/04/2021  ? Sepsis ([place]) 05/04/2021  ? Diabetes mellitus without complication ([place]) 41/96/2229  ? Chronic diastolic CHF (congestive heart failure) ([place]) 05/04/2021  ? Recurrent major depressive disorder, in partial remission ([place]) 03/24/2021  ? Gout 03/24/2021  ? PAD (peripheral artery disease) ([place]) 06/24/2020  ? Iron deficiency 10/07/2018  ? Primary osteoarthritis of left knee 09/17/2018  ? Emphysema lung ([place]) 03/14/2018  ? Diabetic peripheral neuropathy associated with type 2 diabetes mellitus ([place]) 03/14/2018  ? Metatarsalgia of both feet 03/14/2018  ? Abnormality of lung 03/14/2018  ? Acquired absence of right breast 06/11/2017  ? 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[place]) 05/09/2017  ? 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  ? Dysphagia 03/11/2017  ? Obesity 03/11/2017  ? Subclinical hypothyroidism 06/08/2015  ? Osteopenia 06/08/2015  ? Billowing mitral valve 06/08/2015  ? Melanoma in situ ([place]) 06/08/2015  ? Cramps of lower extremity 06/08/2015  ?  Type 2 diabetes mellitus ([place]) 05/18/2015  ? Chronic diarrhea 05/18/2015  ? Anemia 05/18/2015  ? Clinical depression 01/26/2015  ? Hyperlipidemia associated with type 2 diabetes mellitus ([place]) 10/28/2014  ? Hypertension associated with diabetes ([place]) 09/27/2014  ? GERD (gastroesophageal reflux disease) 08/06/2014  ? Overactive bladder 07/16/2014  ? ?PCP:  Virginia Crews, MD ?Pharmacy:   ?[id number]?Phone: [phone]: [phone]? ? ? ? ?Social Determinants of Health (SDOH) Interventions ?  ? ?Readmission Risk Interventions ?   ?  View : No data to display.  ?  ?  ?  ? ? ? ?

## 2021-05-06 DIAGNOSIS — J189 Pneumonia, unspecified organism: Secondary | ICD-10-CM | POA: Diagnosis not present

## 2021-05-06 LAB — GLUCOSE, CAPILLARY: Glucose-Capillary: 110 mg/dL — ABNORMAL HIGH (ref 70–99)

## 2021-05-06 MED ORDER — IPRATROPIUM-ALBUTEROL 0.5-2.5 (3) MG/3ML IN SOLN
3.0000 mL | Freq: Three times a day (TID) | RESPIRATORY_TRACT | Status: DC
  Administered 2021-05-06 – 2021-05-07 (×4): 3 mL via RESPIRATORY_TRACT
  Filled 2021-05-06 (×4): qty 3

## 2021-05-06 MED ORDER — ALUM & MAG HYDROXIDE-SIMETH 200-200-20 MG/5ML PO SUSP: 30.0000 mL | ORAL | Status: DC | PRN

## 2021-05-06 MED ORDER — FAMOTIDINE 20 MG PO TABS
20.0000 mg | ORAL_TABLET | Freq: Two times a day (BID) | ORAL | Status: DC
  Administered 2021-05-06 – 2021-05-08 (×5): 20 mg via ORAL
  Filled 2021-05-06 (×5): qty 1

## 2021-05-06 NOTE — Progress Notes (Signed)
?PROGRESS NOTE ? ? ? ?Natasha Vasquez  IHK:[id number] DOB: 05-Jun-1940 DOA: 05/04/2021 ?PCP: Virginia Crews, MD  ? ? ?Brief Narrative:  ?81 y.o. female with medical history significant of hypertension, hyperlipidemia, diet controlled diabetes, GERD, hypothyroidism, gout, depression, thoracic aortic aneurysm, kidney stone, right breast DCIS, Bell's palsy, Barrett's esophagus, PAD, overactive bladder, who presents with shortness of breath. ?  ?Patient states that she has chronic shortness of breath for several months, which has worsened recently. She has cough with little mucus production.  No chest pain.  She states that he has low-grade fever of 100.1 at 1 time, no chills.  She states that she coughed up some bright red-colored blood this morning.  Patient does not have nausea, vomiting, diarrhea or abdominal pain.  No symptoms of UTI. Of note,  ?  ?Of note, pt had CTA by CT surgeon on 03/07/21 due to hx of thoracic aortic aneurysm and was found to have groundglass appearance of lung. She was given referral to pulmonologist, Dr. Patsey Berthold.  Per Dr. Domingo Dimes note, this is nonspecific finding, could be related to fluid.  She had 2D echo on 04/03/2021 which showed EF of 60 to 65% with grade 1 diastolic dysfunction. ? ?Hemoglobin stable.  No further episodes of hemoptysis. ? ? ?Assessment & Plan: ?  ?Principal Problem: ?  CAP (community acquired pneumonia) ?Active Problems: ?  Sepsis ([place]) ?  Gout ?  HTN (hypertension) ?  HLD (hyperlipidemia) ?  Depression ?  Elevated troponin ?  Chronic diastolic CHF (congestive heart failure) ([place]) ? ?CAP (community acquired pneumonia) ?Sepsis due to CAP ?Small-volume hemoptysis ?pt has sepsis with WBC 18.2, heart rate 91, RR 24.  Lactic acid is normal 1.7.   ?CT angiograms negative for PE, but  showed infiltration in right upper lobe and lingular lobe.  No further hemoptysis ?Plan: ?Can DC steroids ?Continue Rocephin and azithromycin ?Bronchodilators/mucolytic's ?Follow infectious  panel, no growth to date ?Monitor vitals and fever curve ?Appreciate pulmonology recommendations ?  ?Sepsis ([place]) ?See above ? ?Severe GERD ?Likely underlying some of the patient's symptoms of chest tightness and contributing to cough ?Plan: ?Twice daily Protonix ?Twice daily Pepcid ?As needed Mylanta ?Upright as possible during the day ?  ?Chronic diastolic CHF (congestive heart failure) ([place]) ?2D echo on 04/03/2021 showed EF of 60 to 65% with grade 1 diastolic dysfunction.   ?No pulm edema on chest x-ray.  BNP normal 88.   ?Patient does not seem to have CHF exacerbation. ?-Watch volume status closely ?-No IVF.  Patient tolerating p.o. ?  ?Elevated troponin ?Troponin level 20--> 20.  No chest pain.   ?Most likely due to demand ischemia ?-Hold aspirin due to hemoptysis ?-Crestor ?  ?Depression ?- Continue home medications ?  ?HLD (hyperlipidemia) ?- Crestor ?  ?HTN (hypertension) ?- IV hydralazine as needed ?-Continue lisinopril ?  ?Gout ?- Continue allopurinol ? ? ?DVT prophylaxis: SCD ?Code Status: Full ?Family Communication: Husband via phone 4/21 ?Disposition Plan: Status is: Inpatient ?Remains inpatient appropriate because: CAP with associated small-volume hemoptysis.  Anticipated date of discharge 4/23 ? ? ?Level of care: Med-Surg ? ?Consultants:  ?Pulmonology ? ?Procedures:  ?None ? ?Antimicrobials: ?Rocephin ?Azithromycin ? ? ?Subjective: ?Patient seen and examined.  On 2 L.  Symptomatically improved.  Does endorse some cough associated with sore throat and epigastric tightness ? ?Objective: ?Vitals:  ? 05/05/21 2047 05/06/21 0353 05/06/21 0732 05/06/21 0757  ?BP: 134/69 137/75 (!) 179/100   ?Pulse: 79 72 73 73  ?Resp: 20 18 18  18  ?Temp: 97.6 ?F (36.4 ?C) 97.9 ?F (36.6 ?C) 98 ?F (36.7 ?C)   ?TempSrc: Oral     ?SpO2: 99% 96% 92% 94%  ?Weight:      ?Height:      ? ? ?Intake/Output Summary (Last 24 hours) at 05/06/2021 1153 ?Last data filed at 05/06/2021 1023 ?Gross per 24 hour  ?Intake 1310 ml  ?Output --  ?Net  1310 ml  ? ?Filed Weights  ? 05/04/21 1149  ?Weight: 86.2 kg  ? ? ?Examination: ? ?General exam: NAD ?Respiratory system: Mild scattered crackles.  Worse on left.  Normal work of breathing.  2 L ?Cardiovascular system: S1-S2, RRR, no murmurs, no pedal edema ?Gastrointestinal system: Soft, NT/ND, normal bowel sounds ?Central nervous system: Alert and oriented. No focal neurological deficits. ?Extremities: Symmetric 5 x 5 power. ?Skin: No rashes, lesions or ulcers ?Psychiatry: Judgement and insight appear normal. Mood & affect appropriate.  ? ? ? ?Data Reviewed: I have personally reviewed following labs and imaging studies ? ?CBC: ?Recent Labs  ?Lab 05/04/21 ?1213 05/05/21 ?0092  ?WBC 18.2* 12.7*  ?HGB 12.0 11.7*  ?HCT 37.3 37.4  ?MCV 84.6 87.4  ?PLT 315 290  ? ?Basic Metabolic Panel: ?Recent Labs  ?Lab 05/04/21 ?1213  ?NA 136  ?K 4.2  ?CL 101  ?CO2 25  ?GLUCOSE 140*  ?BUN 13  ?CREATININE 0.97  ?CALCIUM 9.2  ? ?GFR: ?Estimated Creatinine Clearance: 49.8 mL/min (by C-G formula based on SCr of 0.97 mg/dL). ?Liver Function Tests: ?No results for input(s): AST, ALT, ALKPHOS, BILITOT, PROT, ALBUMIN in the last 168 hours. ?No results for input(s): LIPASE, AMYLASE in the last 168 hours. ?No results for input(s): AMMONIA in the last 168 hours. ?Coagulation Profile: ?No results for input(s): INR, PROTIME in the last 168 hours. ?Cardiac Enzymes: ?No results for input(s): CKTOTAL, CKMB, CKMBINDEX, TROPONINI in the last 168 hours. ?BNP (last 3 results) ?No results for input(s): PROBNP in the last 8760 hours. ?HbA1C: ?Recent Labs  ?  05/04/21 ?1448  ?HGBA1C 6.2*  ? ?CBG: ?Recent Labs  ?Lab 05/05/21 ?0721 05/06/21 ?3300  ?GLUCAP 161* 110*  ? ?Lipid Profile: ?Recent Labs  ?  05/04/21 ?1413  ?CHOL 189  ?HDL 40*  ?LDLCALC 104*  ?TRIG 224*  ?CHOLHDL 4.7  ? ?Thyroid Function Tests: ?No results for input(s): TSH, T4TOTAL, FREET4, T3FREE, THYROIDAB in the last 72 hours. ?Anemia Panel: ?No results for input(s): VITAMINB12, FOLATE,  FERRITIN, TIBC, IRON, RETICCTPCT in the last 72 hours. ?Sepsis Labs: ?Recent Labs  ?Lab 05/04/21 ?1257 05/04/21 ?1413  ?PROCALCITON  --  0.13  ?LATICACIDVEN 1.7  --   ? ? ?Recent Results (from the past 240 hour(s))  ?Resp Panel by RT-PCR (Flu A&B, Covid) Nasopharyngeal Swab     Status: None  ? Collection Time: 05/04/21 12:57 PM  ? Specimen: Nasopharyngeal Swab; Nasopharyngeal(NP) swabs in vial transport medium  ?Result Value Ref Range Status  ? SARS Coronavirus 2 by RT PCR NEGATIVE NEGATIVE Final  ?  Comment: (NOTE) ?SARS-CoV-2 target nucleic acids are NOT DETECTED. ? ?The SARS-CoV-2 RNA is generally detectable in upper respiratory ?specimens during the acute phase of infection. The lowest ?concentration of SARS-CoV-2 viral copies this assay can detect is ?138 copies/mL. A negative result does not preclude SARS-Cov-2 ?infection and should not be used as the sole basis for treatment or ?other patient management decisions. A negative result may occur with  ?improper specimen collection/handling, submission of specimen other ?than nasopharyngeal swab, presence of viral mutation(s) within the ?areas targeted  by this assay, and inadequate number of viral ?copies(<138 copies/mL). A negative result must be combined with ?clinical observations, patient history, and epidemiological ?information. The expected result is Negative. ? ?Fact Sheet for Patients:  ?EntrepreneurPulse.com.au ? ?Fact Sheet for Healthcare Providers:  ?IncredibleEmployment.be ? ?This test is no t yet approved or cleared by the [place] FDA and  ?has been authorized for detection and/or diagnosis of SARS-CoV-2 by ?FDA under an Emergency Use Authorization (EUA). This EUA will remain  ?in effect (meaning this test can be used) for the duration of the ?COVID-19 declaration under Section 564(b)(1) of the Act, 21 ?U.S.C.section 360bbb-3(b)(1), unless the authorization is terminated  ?or revoked sooner.  ? ? ?  ?  Influenza A by PCR NEGATIVE NEGATIVE Final  ? Influenza B by PCR NEGATIVE NEGATIVE Final  ?  Comment: (NOTE) ?The Xpert Xpress SARS-CoV-2/FLU/RSV plus assay is intended as an aid ?in the diagnosis of influenza from

## 2021-05-06 NOTE — Care Plan (Signed)
Courtesy visit.  Patient had questions about her pneumonia and quite it was not seen on CT performed on 21 February.  I showed the patient both the 21 February CT as well as the 20 April CT performed on admission.  On the 21 February CT she had some diffuse groundglass opacities that were nonspecific believed to be edema versus inflammation.  These have been discussed with the patient at the time of her initial consultation at [facility].  On the 20 April CT the groundglass opacities have resolved however, he now has a left upper lobe and lingular pneumonia this is a new, acute process and consistent with pneumonia.  The patient was allowed to ask questions and these were answered to her satisfaction.  She reports that she has been having severe issues with reflux to the point of regurgitating food.  I discussed this with Dr. Priscella Mann yesterday and the patient has been placed on PPI twice daily she does feel better in this regard today. ? ?With regards to her prior groundglass she does have diastolic dysfunction noted on echocardiogram and has coronary calcifications noted on CT.  She has been referred to cardiology and has a future appointment for risk stratification and assessment. ? ?Advised the patient that with regards to pneumonia radiographic clearing takes sometimes 8 to 12 weeks.  She will feel better before her radiographic studies improve.  She has upcoming appointments with [facility] cardiology and will [facility] already scheduled.   ? ?Appreciative of  the hospitalist service for the care given to the patient. ? ?C. Derrill Kay, MD ?Advanced Bronchoscopy ?[facility] ? ? ? ?*This note was dictated using voice recognition software/Dragon.  Despite best efforts to proofread, errors can occur which can change the meaning. Any transcriptional errors that result from this process are unintentional and may not be fully corrected at the time of dictation. ?

## 2021-05-07 DIAGNOSIS — J189 Pneumonia, unspecified organism: Secondary | ICD-10-CM | POA: Diagnosis not present

## 2021-05-07 LAB — GLUCOSE, CAPILLARY: Glucose-Capillary: 142 mg/dL — ABNORMAL HIGH (ref 70–99)

## 2021-05-07 NOTE — TOC Transition Note (Signed)
Transition of Care (TOC) - CM/SW Discharge Note ? ? ?Patient Details  ?Name: Natasha Vasquez ?MRN: [id number] ?Date of Birth: 1940-10-16 ? ?Transition of Care (TOC) CM/SW Contact:  ?Adelene Amas, LCSW ?Phone Number: ?05/07/2021, 3:15 PM ? ? ?Clinical Narrative:    ? ?Plan for patient to discharge back to [facility] independent living, tomorrow 05/08/2021. No TOC needs have been identified at this time. If new patient transition needs arise, please place a TOC consult. ? ?  ?  ? ? ?Patient Goals and CMS Choice ?  ?  ?  ? ?Discharge Placement ?  ?           ?  ?  ?  ?  ? ?Discharge Plan and Services ?  ?  ?           ?  ?  ?  ?  ?  ?  ?  ?  ?  ?  ? ?Social Determinants of Health (SDOH) Interventions ?  ? ? ?Readmission Risk Interventions ?   ? View : No data to display.  ?  ?  ?  ? ? ? ? ? ?

## 2021-05-07 NOTE — Progress Notes (Signed)
?PROGRESS NOTE ? ? ? ?Natasha Vasquez  DDU:[id number] DOB: 31-May-1940 DOA: 05/04/2021 ?PCP: Virginia Crews, MD  ? ? ?Brief Narrative:  ?81 y.o. female with medical history significant of hypertension, hyperlipidemia, diet controlled diabetes, GERD, hypothyroidism, gout, depression, thoracic aortic aneurysm, kidney stone, right breast DCIS, Bell's palsy, Barrett's esophagus, PAD, overactive bladder, who presents with shortness of breath. ?  ?Patient states that she has chronic shortness of breath for several months, which has worsened recently. She has cough with little mucus production.  No chest pain.  She states that he has low-grade fever of 100.1 at 1 time, no chills.  She states that she coughed up some bright red-colored blood this morning.  Patient does not have nausea, vomiting, diarrhea or abdominal pain.  No symptoms of UTI. Of note,  ?  ?Of note, pt had CTA by CT surgeon on 03/07/21 due to hx of thoracic aortic aneurysm and was found to have groundglass appearance of lung. She was given referral to pulmonologist, Dr. Patsey Berthold.  Per Dr. Domingo Dimes note, this is nonspecific finding, could be related to fluid.  She had 2D echo on 04/03/2021 which showed EF of 60 to 65% with grade 1 diastolic dysfunction. ? ?Hemoglobin stable.  Still having some small-volume hemoptysis ? ? ?Assessment & Plan: ?  ?Principal Problem: ?  CAP (community acquired pneumonia) ?Active Problems: ?  Sepsis ([place]) ?  Gout ?  HTN (hypertension) ?  HLD (hyperlipidemia) ?  Depression ?  Elevated troponin ?  Chronic diastolic CHF (congestive heart failure) ([place]) ? ?CAP (community acquired pneumonia) ?Sepsis due to CAP ?Small-volume hemoptysis ?pt has sepsis with WBC 18.2, heart rate 91, RR 24.  Lactic acid is normal 1.7.   ?CT angiograms negative for PE, but  showed infiltration in right upper lobe and lingular lobe.  No further hemoptysis ?Plan: ?No steroids ?Continue Rocephin and azithromycin ?Bronchodilators/mucolytic's ?Follow  infectious panel, no growth to date ?Monitor vitals and fever curve ?Appreciate pulmonology recommendations ?Anticipate discharge 4/24 ?  ?Sepsis ([place]) ?See above ? ?Severe GERD ?Likely underlying some of the patient's symptoms of chest tightness and contributing to cough ?Appears to be improving ?Plan: ?Twice daily Protonix ?Twice daily Pepcid ?As needed Mylanta ?Upright as possible during the day ?  ?Chronic diastolic CHF (congestive heart failure) ([place]) ?2D echo on 04/03/2021 showed EF of 60 to 65% with grade 1 diastolic dysfunction.   ?No pulm edema on chest x-ray.  BNP normal 88.   ?Patient does not seem to have CHF exacerbation. ?-Watch volume status closely ?-No IVF.  Patient tolerating p.o. ?  ?Elevated troponin ?Troponin level 20--> 20.  No chest pain.   ?Most likely due to demand ischemia ?-Hold aspirin due to hemoptysis ?-Crestor ?  ?Depression ?- Continue home medications ?  ?HLD (hyperlipidemia) ?- Crestor ?  ?HTN (hypertension) ?- IV hydralazine as needed ?-Continue lisinopril ?  ?Gout ?- Continue allopurinol ? ? ?DVT prophylaxis: SCD ?Code Status: Full ?Family Communication: Husband via phone 4/21 ?Disposition Plan: Status is: Inpatient ?Remains inpatient appropriate because: CAP with associated small-volume hemoptysis.  Anticipated date of discharge 4/24 ? ? ?Level of care: Med-Surg ? ?Consultants:  ?Pulmonology ? ?Procedures:  ?None ? ?Antimicrobials: ?Rocephin ?Azithromycin ? ? ?Subjective: ?Patient seen and examined.  Weaned to room air.  Still having intermittent shortness of breath but improving over interval.  Still with small-volume hemoptysis ? ?Objective: ?Vitals:  ? 05/07/21 0327 05/07/21 0342 05/07/21 0758 05/07/21 0900  ?BP: 134/73   (!) 141/67  ?Pulse: 77  77 81  ?Resp: '16  18 18  '$ ?Temp: 98 ?F (36.7 ?C)     ?TempSrc: Oral     ?SpO2: 95%  96% 93%  ?Weight:  86.2 kg    ?Height:      ? ? ?Intake/Output Summary (Last 24 hours) at 05/07/2021 1130 ?Last data filed at 05/07/2021 1003 ?Gross per 24  hour  ?Intake 960 ml  ?Output --  ?Net 960 ml  ? ?Filed Weights  ? 05/04/21 1149 05/07/21 0342  ?Weight: 86.2 kg 86.2 kg  ? ? ?Examination: ? ?General exam: No acute distress ?Respiratory system: Mild bibasilar crackles.  Normal work of breathing.  Room air ?Cardiovascular system: S1-S2, RRR, no murmurs, no pedal edema ?Gastrointestinal system: Soft, NT/ND, normal bowel sounds ?Central nervous system: Alert and oriented. No focal neurological deficits. ?Extremities: Symmetric 5 x 5 power. ?Skin: No rashes, lesions or ulcers ?Psychiatry: Judgement and insight appear normal. Mood & affect appropriate.  ? ? ? ?Data Reviewed: I have personally reviewed following labs and imaging studies ? ?CBC: ?Recent Labs  ?Lab 05/04/21 ?1213 05/05/21 ?7106  ?WBC 18.2* 12.7*  ?HGB 12.0 11.7*  ?HCT 37.3 37.4  ?MCV 84.6 87.4  ?PLT 315 290  ? ?Basic Metabolic Panel: ?Recent Labs  ?Lab 05/04/21 ?1213  ?NA 136  ?K 4.2  ?CL 101  ?CO2 25  ?GLUCOSE 140*  ?BUN 13  ?CREATININE 0.97  ?CALCIUM 9.2  ? ?GFR: ?Estimated Creatinine Clearance: 49.8 mL/min (by C-G formula based on SCr of 0.97 mg/dL). ?Liver Function Tests: ?No results for input(s): AST, ALT, ALKPHOS, BILITOT, PROT, ALBUMIN in the last 168 hours. ?No results for input(s): LIPASE, AMYLASE in the last 168 hours. ?No results for input(s): AMMONIA in the last 168 hours. ?Coagulation Profile: ?No results for input(s): INR, PROTIME in the last 168 hours. ?Cardiac Enzymes: ?No results for input(s): CKTOTAL, CKMB, CKMBINDEX, TROPONINI in the last 168 hours. ?BNP (last 3 results) ?No results for input(s): PROBNP in the last 8760 hours. ?HbA1C: ?Recent Labs  ?  05/04/21 ?1448  ?HGBA1C 6.2*  ? ?CBG: ?Recent Labs  ?Lab 05/05/21 ?0721 05/06/21 ?2694 05/07/21 ?8546  ?GLUCAP 161* 110* 142*  ? ?Lipid Profile: ?Recent Labs  ?  05/04/21 ?1413  ?CHOL 189  ?HDL 40*  ?LDLCALC 104*  ?TRIG 224*  ?CHOLHDL 4.7  ? ?Thyroid Function Tests: ?No results for input(s): TSH, T4TOTAL, FREET4, T3FREE, THYROIDAB in the  last 72 hours. ?Anemia Panel: ?No results for input(s): VITAMINB12, FOLATE, FERRITIN, TIBC, IRON, RETICCTPCT in the last 72 hours. ?Sepsis Labs: ?Recent Labs  ?Lab 05/04/21 ?1257 05/04/21 ?1413  ?PROCALCITON  --  0.13  ?LATICACIDVEN 1.7  --   ? ? ?Recent Results (from the past 240 hour(s))  ?Resp Panel by RT-PCR (Flu A&B, Covid) Nasopharyngeal Swab     Status: None  ? Collection Time: 05/04/21 12:57 PM  ? Specimen: Nasopharyngeal Swab; Nasopharyngeal(NP) swabs in vial transport medium  ?Result Value Ref Range Status  ? SARS Coronavirus 2 by RT PCR NEGATIVE NEGATIVE Final  ?  Comment: (NOTE) ?SARS-CoV-2 target nucleic acids are NOT DETECTED. ? ?The SARS-CoV-2 RNA is generally detectable in upper respiratory ?specimens during the acute phase of infection. The lowest ?concentration of SARS-CoV-2 viral copies this assay can detect is ?138 copies/mL. A negative result does not preclude SARS-Cov-2 ?infection and should not be used as the sole basis for treatment or ?other patient management decisions. A negative result may occur with  ?improper specimen collection/handling, submission of specimen other ?than nasopharyngeal swab, presence  of viral mutation(s) within the ?areas targeted by this assay, and inadequate number of viral ?copies(<138 copies/mL). A negative result must be combined with ?clinical observations, patient history, and epidemiological ?information. The expected result is Negative. ? ?Fact Sheet for Patients:  ?EntrepreneurPulse.com.au ? ?Fact Sheet for Healthcare Providers:  ?IncredibleEmployment.be ? ?This test is no t yet approved or cleared by the [place] FDA and  ?has been authorized for detection and/or diagnosis of SARS-CoV-2 by ?FDA under an Emergency Use Authorization (EUA). This EUA will remain  ?in effect (meaning this test can be used) for the duration of the ?COVID-19 declaration under Section 564(b)(1) of the Act, 21 ?U.S.C.section 360bbb-3(b)(1),  unless the authorization is terminated  ?or revoked sooner.  ? ? ?  ? Influenza A by PCR NEGATIVE NEGATIVE Final  ? Influenza B by PCR NEGATIVE NEGATIVE Final  ?  Comment: (NOTE) ?The Xpert Xpress SARS-CoV-2/F

## 2021-05-08 DIAGNOSIS — J189 Pneumonia, unspecified organism: Secondary | ICD-10-CM | POA: Diagnosis not present

## 2021-05-08 LAB — GLUCOSE, CAPILLARY: Glucose-Capillary: 99 mg/dL (ref 70–99)

## 2021-05-08 LAB — BASIC METABOLIC PANEL
Anion gap: 8 (ref 5–15)
BUN: 17 mg/dL (ref 8–23)
CO2: 29 mmol/L (ref 22–32)
Calcium: 8.4 mg/dL — ABNORMAL LOW (ref 8.9–10.3)
Chloride: 104 mmol/L (ref 98–111)
Creatinine, Ser: 0.87 mg/dL (ref 0.44–1.00)
GFR, Estimated: >60 mL/min (ref >60)
Glucose, Bld: 104 mg/dL — ABNORMAL HIGH (ref 70–99)
Potassium: 3.9 mmol/L (ref 3.5–5.1)
Sodium: 141 mmol/L (ref 135–145)

## 2021-05-08 LAB — CBC
HCT: 35.8 % — ABNORMAL LOW (ref 36.0–46.0)
Hemoglobin: 11.1 g/dL — ABNORMAL LOW (ref 12.0–15.0)
MCH: 26.9 pg (ref 26.0–34.0)
MCHC: 31 g/dL (ref 30.0–36.0)
MCV: 86.9 fL (ref 80.0–100.0)
Platelets: 331 10*3/uL (ref 150–400)
RBC: 4.12 MIL/uL (ref 3.87–5.11)
RDW: 15.9 % — ABNORMAL HIGH (ref 11.5–15.5)
WBC: 9.9 10*3/uL (ref 4.0–10.5)
nRBC: 0 % (ref 0.0–0.2)

## 2021-05-08 MED ORDER — ALUM & MAG HYDROXIDE-SIMETH 200-200-20 MG/5ML PO SUSP: 30.0000 mL | ORAL | 0 refills | Status: DC | PRN

## 2021-05-08 MED ORDER — CEFDINIR 300 MG PO CAPS: 300.0000 mg | ORAL_CAPSULE | Freq: Two times a day (BID) | ORAL | 0 refills | Status: AC

## 2021-05-08 MED ORDER — FAMOTIDINE 20 MG PO TABS: 20.0000 mg | ORAL_TABLET | Freq: Two times a day (BID) | ORAL | 0 refills | Status: DC

## 2021-05-08 NOTE — Discharge Summary (Signed)
Physician Discharge Summary  ?Natasha Vasquez EXH:[id number] DOB: September 01, 1940 DOA: 05/04/2021 ? ?PCP: Virginia Crews, MD ? ?Admit date: 05/04/2021 ?Discharge date: 05/08/2021 ? ?Admitted From: Home ?Disposition: Home ? ?Recommendations for Outpatient Follow-up:  ?Follow up with PCP in 1-2 weeks ?Consider referral to GI ?Follow-up with cardiology in 3 weeks ? ?Home Health: No ?Equipment/Devices: None ? ?Discharge Condition: Stable ?CODE STATUS: Full ?Diet recommendation: Regular ? ?Brief/Interim Summary: ?81 y.o. female with medical history significant of hypertension, hyperlipidemia, diet controlled diabetes, GERD, hypothyroidism, gout, depression, thoracic aortic aneurysm, kidney stone, right breast DCIS, Bell's palsy, Barrett's esophagus, PAD, overactive bladder, who presents with shortness of breath. ?  ?Patient states that she has chronic shortness of breath for several months, which has worsened recently. She has cough with little mucus production.  No chest pain.  She states that he has low-grade fever of 100.1 at 1 time, no chills.  She states that she coughed up some bright red-colored blood this morning.  Patient does not have nausea, vomiting, diarrhea or abdominal pain.  No symptoms of UTI. Of note,  ?  ?Of note, pt had CTA by CT surgeon on 03/07/21 due to hx of thoracic aortic aneurysm and was found to have groundglass appearance of lung. She was given referral to pulmonologist, Dr. Patsey Berthold.  Per Dr. Domingo Dimes note, this is nonspecific finding, could be related to fluid.  She had 2D echo on 04/03/2021 which showed EF of 60 to 65% with grade 1 diastolic dysfunction. ?  ?Hemoglobin stable.  Still having some small-volume hemoptysis.  Improved on the time of discharge.  Hemoglobin stable.  No evidence of hemodynamically significant bleeding.  Transition to oral antibiotics and discharged home with follow-up with PCP, pulmonology, cardiology.  Consider referral to gastroenterology for evaluation of IBS and  severe GERD. ? ? ? ?Discharge Diagnoses:  ?Principal Problem: ?  CAP (community acquired pneumonia) ?Active Problems: ?  Sepsis ([place]) ?  Gout ?  HTN (hypertension) ?  HLD (hyperlipidemia) ?  Depression ?  Elevated troponin ?  Chronic diastolic CHF (congestive heart failure) ([place]) ? ?CAP (community acquired pneumonia) ?Sepsis due to CAP ?Small-volume hemoptysis ?pt has sepsis with WBC 18.2, heart rate 91, RR 24.  Lactic acid is normal 1.7.   ?CT angiograms negative for PE, but  showed infiltration in right upper lobe and lingular lobe.  No further hemoptysis ?Plan: ?Discharge home.  Discontinue Rocephin and azithromycin.  Start cefdinir to complete total 7-day antibiotic course.  Patient stable for discharge home.  Will follow-up outpatient PCP, pulmonology ?  ?Sepsis ([place]) ?See above ?  ?Severe GERD ?Likely underlying some of the patient's symptoms of chest tightness and contributing to cough ?Appears to be improving ?Plan: ?Twice daily omeprazole ?Twice daily Pepcid ?As needed Mylanta ?Upright as possible during the day ?Strongly recommend outpatient referral to gastroenterology for EGD for evaluation of suspected IBS and severe GERD ? ?Discharge Instructions ? ?Discharge Instructions   ? ? Diet - low sodium heart healthy   Complete by: As directed ?  ? Increase activity slowly   Complete by: As directed ?  ? ?  ? ?Allergies as of 05/08/2021   ? ?   Reactions  ? Codeine Other (See Comments)  ? Chest pain  ? Lorazepam Other (See Comments)  ? "Feels loopy"  ? Oxycodone Other (See Comments)  ? MENTAL CHANGES  ? Paregoric   ? Chest pain---tolerated MORPHINE  ? Penicillins Hives  ? Has patient had a PCN reaction causing immediate  rash, facial/tongue/throat swelling, SOB or lightheadedness with hypotension: Yes ?Has patient had a PCN reaction causing severe rash involving mucus membranes or skin necrosis: No ?Has patient had a PCN reaction that required hospitalization: NO ?Has patient had a PCN reaction occurring within  the last 10 years: No ?If all of the above answers are "NO", then may proceed with Cephalosporin use. ?Can take Augmentin  ? Quinolones   ? PATIENT UNAWARE OF allergy to this class of antibiotics, only aware of allergy to PCNs ?Patient was warned about not using Cipro and similar antibiotics. ?Recent studies have raised concern that fluoroquinolone antibiotics could be associated with an increased risk of aortic aneurysm ?Fluoroquinolones have non-antimicrobial properties that might jeopardise the integrity of the extracellular matrix of the vascular wall ?In a  propensity score matched cohort study in [place], there was a 66% increased rate  ? Aleve [naproxen Sodium] Hives, Rash, Other (See Comments)  ? headaches  ? ?  ? ?  ?Medication List  ?  ? ?STOP taking these medications   ? ?clindamycin 150 MG capsule ?Commonly known as: CLEOCIN ?  ?Mitigare 0.6 MG Caps ?Generic drug: Colchicine ?  ? ?  ? ?TAKE these medications   ? ?acetaminophen 500 MG tablet ?Commonly known as: TYLENOL ?Take 500 mg by mouth every 6 (six) hours as needed. ?  ?albuterol 108 (90 Base) MCG/ACT inhaler ?Commonly known as: VENTOLIN HFA ?Inhale 2 puffs into the lungs every 6 (six) hours as needed for wheezing or shortness of breath. ?  ?allopurinol 100 MG tablet ?Commonly known as: ZYLOPRIM ?TAKE 1 TABLET BY MOUTH EVERY DAY ?  ?alum & mag hydroxide-simeth 200-200-20 MG/5ML suspension ?Commonly known as: MAALOX/MYLANTA ?Take 30 mLs by mouth every 4 (four) hours as needed for indigestion or heartburn. ?  ?ARIPiprazole 2 MG tablet ?Commonly known as: ABILIFY ?Take 1 tablet (2 mg total) by mouth daily. ?  ?aspirin 81 MG tablet ?Take 81 mg by mouth daily. ?  ?bismuth subsalicylate 096 MG chewable tablet ?Commonly known as: PEPTO BISMOL ?Chew 262 mg by mouth as needed. **capsule** ?What changed: Another medication with the same name was removed. Continue taking this medication, and follow the directions you see here. ?  ?calcium carbonate 500 MG  chewable tablet ?Commonly known as: TUMS - dosed in mg elemental calcium ?Chew 1 tablet by mouth as needed for indigestion or heartburn. ?  ?cefdinir 300 MG capsule ?Commonly known as: OMNICEF ?Take 1 capsule (300 mg total) by mouth 2 (two) times daily for 4 days. ?Start taking on: May 09, 2021 ?  ?CENTRUM SILVER PO ?Take 1 tablet by mouth daily. ?  ?clobetasol ointment 0.05 % ?Commonly known as: TEMOVATE ?APPLY TO AFFECTED AREAS AT NIGHT AS NEEDED ?  ?clotrimazole 1 % cream ?Commonly known as: LOTRIMIN ?Apply 1 application topically 2 (two) times daily. ?  ?colestipol 1 g tablet ?Commonly known as: COLESTID ?Take 1 g by mouth 2 (two) times daily. ?  ?famotidine 20 MG tablet ?Commonly known as: PEPCID ?Take 1 tablet (20 mg total) by mouth 2 (two) times daily. ?  ?ferrous sulfate 324 MG Tbec ?Take 324 mg by mouth daily with breakfast. ?  ?fluocinonide 0.05 % external solution ?Commonly known as: LIDEX ?APPLY TO AFFECTED AREAS OF SCALP DAILY UNTIL CLEAR, THEN FOR FLARES ?  ?lisinopril 5 MG tablet ?Commonly known as: ZESTRIL ?TAKE 1 TABLET BY MOUTH EVERY DAY ?  ?omeprazole 40 MG capsule ?Commonly known as: PRILOSEC ?Take 1 capsule (40 mg total) by mouth  2 (two) times daily. ?  ?oxybutynin 10 MG 24 hr tablet ?Commonly known as: DITROPAN-XL ?TAKE 1 TABLET BY MOUTH EVERY DAY ?  ?rosuvastatin 10 MG tablet ?Commonly known as: CRESTOR ?TAKE 1 TABLET BY MOUTH EVERY DAY ?  ?Venlafaxine HCl 225 MG Tb24 ?TAKE 1 TABLET BY MOUTH EVERY DAY ?  ? ?  ? ? ?Allergies  ?Allergen Reactions  ? Codeine Other (See Comments)  ?  Chest pain  ? Lorazepam Other (See Comments)  ?  "Feels loopy"  ? Oxycodone Other (See Comments)  ?  MENTAL CHANGES  ? Paregoric   ?  Chest pain---tolerated MORPHINE  ? Penicillins Hives  ?  Has patient had a PCN reaction causing immediate rash, facial/tongue/throat swelling, SOB or lightheadedness with hypotension: Yes ?Has patient had a PCN reaction causing severe rash involving mucus membranes or skin necrosis:  No ?Has patient had a PCN reaction that required hospitalization: NO ?Has patient had a PCN reaction occurring within the last 10 years: No ?If all of the above answers are "NO", then may proceed with Cephal

## 2021-05-08 NOTE — Discharge Instructions (Signed)
IV's removed, discharged instructions reviewed, patient to be discharged to home with husband ?

## 2021-05-08 NOTE — Care Management Important Message (Signed)
Important Message ? ?Patient Details  ?Name: Natasha Vasquez ?MRN: [id number] ?Date of Birth: 08/28/1940 ? ? ?Medicare Important Message Given:  Yes ? ? ? ? ?Dannette Barbara ?05/08/2021, 11:56 AM ?

## 2021-05-09 ENCOUNTER — Telehealth: Payer: Self-pay

## 2021-05-09 LAB — CULTURE, BLOOD (ROUTINE X 2)
Culture: NO GROWTH
Culture: NO GROWTH

## 2021-05-09 LAB — LEGIONELLA PNEUMOPHILA SEROGP 1 UR AG: L. pneumophila Serogp 1 Ur Ag: NEGATIVE

## 2021-05-09 NOTE — Telephone Encounter (Signed)
Transition Care Management Follow-up Telephone Call ?Date of discharge and from where: [facility] 05-08-21 Dx: CAP  ?How have you been since you were released from the hospital? Feeling better  ?Any questions or concerns? No ? ?Items Reviewed: ?Did the pt receive and understand the discharge instructions provided? Yes  ?Medications obtained and verified? Yes  ?Other? No  ?Any new allergies since your discharge? No  ?Dietary orders reviewed? Yes ?Do you have support at home? Yes  ? ?Home Care and Equipment/Supplies: ?Were home health services ordered? no ?If so, what is the name of the agency? na  ?Has the agency set up a time to come to the patient's home? not applicable ?Were any new equipment or medical supplies ordered?  No ?What is the name of the medical supply agency? na ?Were you able to get the supplies/equipment? not applicable ?Do you have any questions related to the use of the equipment or supplies? No ? ?Functional Questionnaire: (I = Independent and D = Dependent) ?ADLs: I ? ?Bathing/Dressing- I ? ?Meal Prep- I ? ?Eating- I ? ?Maintaining continence- I ? ?Transferring/Ambulation- I ? ?Managing Meds- I ? ?Follow up appointments reviewed: ? ?PCP Hospital f/u appt confirmed? Yes  Scheduled to see Dr Brita Romp on 05-16-21 @ 11am. ?[facility] f/u appt confirmed? Yes  Scheduled to see Dr Rockey Situ on 05-29-21 @ 1040am- Dr Patsey Berthold 06-28-21 at 10am. ?Are transportation arrangements needed? No  ?If their condition worsens, is the pt aware to call PCP or go to the Emergency Dept.? Yes ?Was the patient provided with contact information for the PCP's office or ED? Yes ?Was to pt encouraged to call back with questions or concerns? Yes  ?

## 2021-05-10 DIAGNOSIS — I7121 Aneurysm of the ascending aorta, without rupture: Secondary | ICD-10-CM | POA: Insufficient documentation

## 2021-05-12 ENCOUNTER — Other Ambulatory Visit: Payer: Self-pay | Admitting: General Surgery

## 2021-05-12 DIAGNOSIS — Z1239 Encounter for other screening for malignant neoplasm of breast: Secondary | ICD-10-CM

## 2021-05-12 DIAGNOSIS — C50911 Malignant neoplasm of unspecified site of right female breast: Secondary | ICD-10-CM

## 2021-05-16 ENCOUNTER — Encounter: Payer: Self-pay | Admitting: Family Medicine

## 2021-05-16 ENCOUNTER — Ambulatory Visit: Payer: Medicare PPO | Admitting: Family Medicine

## 2021-05-16 VITALS — BP 124/90 | HR 82 | Temp 98.1°F | Resp 16 | Wt 188.0 lb

## 2021-05-16 DIAGNOSIS — M1A0710 Idiopathic chronic gout, right ankle and foot, without tophus (tophi): Secondary | ICD-10-CM | POA: Diagnosis not present

## 2021-05-16 DIAGNOSIS — J439 Emphysema, unspecified: Secondary | ICD-10-CM | POA: Diagnosis not present

## 2021-05-16 DIAGNOSIS — K219 Gastro-esophageal reflux disease without esophagitis: Secondary | ICD-10-CM

## 2021-05-16 DIAGNOSIS — E1159 Type 2 diabetes mellitus with other circulatory complications: Secondary | ICD-10-CM

## 2021-05-16 DIAGNOSIS — I5032 Chronic diastolic (congestive) heart failure: Secondary | ICD-10-CM

## 2021-05-16 DIAGNOSIS — J189 Pneumonia, unspecified organism: Secondary | ICD-10-CM

## 2021-05-16 DIAGNOSIS — R0602 Shortness of breath: Secondary | ICD-10-CM

## 2021-05-16 DIAGNOSIS — I152 Hypertension secondary to endocrine disorders: Secondary | ICD-10-CM | POA: Diagnosis not present

## 2021-05-16 NOTE — Assessment & Plan Note (Signed)
Recent hospitalization for sepsis due to ?Negative blood cultures ?Completed course of antibiotics and Solu-Medrol ?Seems to be back at baseline with chronic shortness of breath ?Cough, hemoptysis, fatigue improving ?Walking pulse ox does not meet criteria for home O2 ?

## 2021-05-16 NOTE — Progress Notes (Signed)
?  ? ?I,Sulibeya S Dimas,acting as a scribe for Lavon Paganini, MD.,have documented all relevant documentation on the behalf of Lavon Paganini, MD,as directed by  Lavon Paganini, MD while in the presence of Lavon Paganini, MD.  ? ?Established patient visit ? ? ?Patient: Natasha Vasquez   DOB: 05/02/1940   81 y.o. Female  MRN: [id number] ?Visit Date: 05/16/2021 ? ?Today's healthcare provider: Lavon Paganini, MD  ? ?Chief Complaint  ?Patient presents with  ? Hospitalization Follow-up  ? ?Subjective  ?  ?HPI  ?Follow up Hospitalization ? ?Patient was admitted to [facility] on 05/04/21 and discharged on 05/08/21. ?She was treated for CAP, sepsis. ?Treatment for this included IV rocephin and azithromycin. Completed cefdinir course at home after d/c.   ?-Solu-Medrol 40 mg daily ?- Mucinex for cough  ?- Bronchodilators ?Telephone follow up was done on 05/09/21 ?She reports excellent compliance with treatment. ?She reports this condition is improved. ? ? ?She is concerned that she continues to have chronic SOB and does not have an answer for that. Felt better while in the hospital on O2.  Getting worse since stopping O2 and going home.   ? ?She has upcoming appts with cardiology and pulmonology ?----------------------------------------------------------------------------------------- - ? ? ?Medications: ?Outpatient Medications Prior to Visit  ?Medication Sig  ? acetaminophen (TYLENOL) 500 MG tablet Take 500 mg by mouth every 6 (six) hours as needed.  ? albuterol (VENTOLIN HFA) 108 (90 Base) MCG/ACT inhaler Inhale 2 puffs into the lungs every 6 (six) hours as needed for wheezing or shortness of breath.  ? allopurinol (ZYLOPRIM) 100 MG tablet TAKE 1 TABLET BY MOUTH EVERY DAY  ? alum & mag hydroxide-simeth (MAALOX/MYLANTA) 200-200-20 MG/5ML suspension Take 30 mLs by mouth every 4 (four) hours as needed for indigestion or heartburn.  ? ARIPiprazole (ABILIFY) 2 MG tablet Take 1 tablet (2 mg total) by mouth daily.  ?  aspirin 81 MG tablet Take 81 mg by mouth daily.  ? bismuth subsalicylate (PEPTO BISMOL) 262 MG chewable tablet Chew 262 mg by mouth as needed. **capsule**  ? calcium carbonate (TUMS - DOSED IN MG ELEMENTAL CALCIUM) 500 MG chewable tablet Chew 1 tablet by mouth as needed for indigestion or heartburn.  ? clobetasol ointment (TEMOVATE) 0.05 % APPLY TO AFFECTED AREAS AT NIGHT AS NEEDED  ? clotrimazole (LOTRIMIN) 1 % cream Apply 1 application topically 2 (two) times daily.  ? colestipol (COLESTID) 1 g tablet Take 1 g by mouth 2 (two) times daily.  ? famotidine (PEPCID) 20 MG tablet Take 1 tablet (20 mg total) by mouth 2 (two) times daily.  ? ferrous sulfate 324 MG TBEC Take 324 mg by mouth daily with breakfast.  ? fluocinonide (LIDEX) 0.05 % external solution APPLY TO AFFECTED AREAS OF SCALP DAILY UNTIL CLEAR, THEN FOR FLARES  ? lisinopril (ZESTRIL) 5 MG tablet TAKE 1 TABLET BY MOUTH EVERY DAY  ? Multiple Vitamins-Minerals (CENTRUM SILVER PO) Take 1 tablet by mouth daily.  ? omeprazole (PRILOSEC) 40 MG capsule Take 1 capsule (40 mg total) by mouth 2 (two) times daily.  ? oxybutynin (DITROPAN-XL) 10 MG 24 hr tablet TAKE 1 TABLET BY MOUTH EVERY DAY  ? rosuvastatin (CRESTOR) 10 MG tablet TAKE 1 TABLET BY MOUTH EVERY DAY  ? Venlafaxine HCl 225 MG TB24 TAKE 1 TABLET BY MOUTH EVERY DAY  ? ?No facility-administered medications prior to visit.  ? ? ?Review of Systems  ?Constitutional:  Positive for appetite change and fatigue. Negative for chills and fever.  ?HENT:  Negative  for congestion.   ?Respiratory:  Positive for cough, shortness of breath and wheezing.   ?Cardiovascular:  Negative for chest pain, palpitations and leg swelling.  ? ?Last CBC ?Lab Results  ?Component Value Date  ? WBC 9.9 05/08/2021  ? HGB 11.1 (L) 05/08/2021  ? HCT 35.8 (L) 05/08/2021  ? MCV 86.9 05/08/2021  ? MCH 26.9 05/08/2021  ? RDW 15.9 (H) 05/08/2021  ? PLT 331 05/08/2021  ? ?Last metabolic panel ?Lab Results  ?Component Value Date  ? GLUCOSE 104 (H)  05/08/2021  ? NA 141 05/08/2021  ? K 3.9 05/08/2021  ? CL 104 05/08/2021  ? CO2 29 05/08/2021  ? BUN 17 05/08/2021  ? CREATININE 0.87 05/08/2021  ? GFRNONAA >60 05/08/2021  ? CALCIUM 8.4 (L) 05/08/2021  ? PROT 7.5 03/28/2021  ? ALBUMIN 5.0 (H) 03/28/2021  ? LABGLOB 2.5 03/28/2021  ? AGRATIO 2.0 03/28/2021  ? BILITOT 0.4 03/28/2021  ? ALKPHOS 105 03/28/2021  ? AST 29 03/28/2021  ? ALT 33 (H) 03/28/2021  ? ANIONGAP 8 05/08/2021  ? ?Last lipids ?Lab Results  ?Component Value Date  ? CHOL 189 05/04/2021  ? HDL 40 (L) 05/04/2021  ? LDLCALC 104 (H) 05/04/2021  ? TRIG 224 (H) 05/04/2021  ? CHOLHDL 4.7 05/04/2021  ? ?  ?  Objective  ?  ?BP 124/90 (BP Location: Left Arm, Patient Position: Sitting, Cuff Size: Large)   Pulse 82   Temp 98.1 ?F (36.7 ?C) (Oral)   Resp 16   Wt 188 lb (85.3 kg)   SpO2 94%   BMI 30.81 kg/m?  ?BP Readings from Last 3 Encounters:  ?05/16/21 124/90  ?05/08/21 (!) 190/91  ?04/27/21 (!) 150/90  ? ?Wt Readings from Last 3 Encounters:  ?05/16/21 188 lb (85.3 kg)  ?05/08/21 189 lb 4.8 oz (85.9 kg)  ?04/27/21 191 lb 9.6 oz (86.9 kg)  ? ?  ?Physical Exam ?Vitals reviewed.  ?Constitutional:   ?   General: She is not in acute distress. ?   Appearance: Normal appearance. She is well-developed. She is not diaphoretic.  ?HENT:  ?   Head: Normocephalic and atraumatic.  ?Eyes:  ?   General: No scleral icterus. ?   Conjunctiva/sclera: Conjunctivae normal.  ?Neck:  ?   Thyroid: No thyromegaly.  ?Cardiovascular:  ?   Rate and Rhythm: Normal rate and regular rhythm.  ?   Pulses: Normal pulses.  ?   Heart sounds: Murmur heard.  ?Pulmonary:  ?   Effort: Pulmonary effort is normal. No respiratory distress.  ?   Breath sounds: Normal breath sounds. No wheezing, rhonchi or rales.  ?Musculoskeletal:  ?   Cervical back: Neck supple.  ?   Right lower leg: No edema.  ?   Left lower leg: No edema.  ?Lymphadenopathy:  ?   Cervical: No cervical adenopathy.  ?Skin: ?   General: Skin is warm and dry.  ?   Findings: No rash.   ?Neurological:  ?   Mental Status: She is alert and oriented to person, place, and time. Mental status is at baseline.  ?Psychiatric:     ?   Mood and Affect: Mood normal.     ?   Behavior: Behavior normal.  ?  ?Walking SpO2 89% at the lowest. ? ? ?No results found for any visits on 05/16/21. ? Assessment & Plan  ?  ? ?Problem List Items Addressed This Visit   ? ?  ? Cardiovascular and Mediastinum  ? Hypertension associated with diabetes ([place])  ?  Well controlled ?Continue current medications ?Reviewed recent metabolic panel ?  ?  ? Chronic diastolic CHF (congestive heart failure) ([place])  ?  2D echo in 03/2021 showed EF of 60 to 65% with grade 1 diastolic dysfunction ?No signs of volume overload and no pulmonary edema on chest x-ray ?Not currently on loop diuretic ?Does not seem to be the likely cause of her chronic shortness of breath as below ? ?  ?  ?  ? Respiratory  ? Emphysema lung ([place])  ?  Followed by pulmonology ?Continue current medications ?Continues to have chronic shortness of breath with no change ? ?  ?  ? CAP (community acquired pneumonia) - Primary  ?  Recent hospitalization for sepsis due to ?Negative blood cultures ?Completed course of antibiotics and Solu-Medrol ?Seems to be back at baseline with chronic shortness of breath ?Cough, hemoptysis, fatigue improving ?Walking pulse ox does not meet criteria for home O2 ? ?  ?  ?  ? Digestive  ? GERD (gastroesophageal reflux disease)  ?  Has been followed by GI ?Feels like she is well controlled at this time on PPI and H2 blocker ?Does not seem to be the main cause of her chronic shortness of breath and chronic cough has resolved ? ?  ?  ?  ? Other  ? Gout  ?  Reports that this is improving significantly after increasing allopurinol dose ? ?  ?  ? SOB (shortness of breath)  ?  Chronic and worse with exertion ?Walking pulse ox does not qualify for home O2 ?Has been evaluated by pulmonology and found to have nonspecific groundglass opacities on CT scan ?She  would later be scheduled for a sleep study ?2D echo as above with normal systolic function and grade 1 diastolic function, but no evidence of volume overload ?She has not had an evaluation for CAD or for occult a

## 2021-05-16 NOTE — Assessment & Plan Note (Signed)
Chronic and worse with exertion ?Walking pulse ox does not qualify for home O2 ?Has been evaluated by pulmonology and found to have nonspecific groundglass opacities on CT scan ?She would later be scheduled for a sleep study ?2D echo as above with normal systolic function and grade 1 diastolic function, but no evidence of volume overload ?She has not had an evaluation for CAD or for occult arrhythmia, but does have appointment upcoming with cardiology ?

## 2021-05-16 NOTE — Assessment & Plan Note (Signed)
Well controlled Continue current medications Reviewed recent metabolic panel 

## 2021-05-16 NOTE — Assessment & Plan Note (Signed)
Has been followed by GI ?Feels like she is well controlled at this time on PPI and H2 blocker ?Does not seem to be the main cause of her chronic shortness of breath and chronic cough has resolved ?

## 2021-05-16 NOTE — Assessment & Plan Note (Signed)
Reports that this is improving significantly after increasing allopurinol dose ?

## 2021-05-16 NOTE — Assessment & Plan Note (Signed)
2D echo in 03/2021 showed EF of 60 to 65% with grade 1 diastolic dysfunction ?No signs of volume overload and no pulmonary edema on chest x-ray ?Not currently on loop diuretic ?Does not seem to be the likely cause of her chronic shortness of breath as below ?

## 2021-05-16 NOTE — Assessment & Plan Note (Signed)
Followed by pulmonology ?Continue current medications ?Continues to have chronic shortness of breath with no change ?

## 2021-05-18 ENCOUNTER — Encounter: Payer: Self-pay | Admitting: Pulmonary Disease

## 2021-05-20 ENCOUNTER — Other Ambulatory Visit: Payer: Self-pay | Admitting: Family Medicine

## 2021-05-20 DIAGNOSIS — E1169 Type 2 diabetes mellitus with other specified complication: Secondary | ICD-10-CM

## 2021-05-22 NOTE — Telephone Encounter (Signed)
Requested Prescriptions  ?Pending Prescriptions Disp Refills  ?? rosuvastatin (CRESTOR) 10 MG tablet [Pharmacy Med Name: ROSUVASTATIN CALCIUM 10 MG TAB] 90 tablet 0  ?  Sig: TAKE 1 TABLET BY MOUTH EVERY DAY  ?  ? Cardiovascular:  Antilipid - Statins 2 Failed - 05/20/2021  9:13 AM  ?  ?  Failed - Lipid Panel in normal range within the last 12 months  ?  Cholesterol, Total  ?Date Value Ref Range Status  ?03/28/2021 257 (H) 100 - 199 mg/dL Final  ? ?Cholesterol  ?Date Value Ref Range Status  ?05/04/2021 189 0 - 200 mg/dL Final  ? ?LDL Chol Calc (NIH)  ?Date Value Ref Range Status  ?03/28/2021 132 (H) 0 - 99 mg/dL Final  ? ?LDL Cholesterol  ?Date Value Ref Range Status  ?05/04/2021 104 (H) 0 - 99 mg/dL Final  ?  Comment:  ?         ?Total Cholesterol/HDL:CHD Risk ?Coronary Heart Disease Risk Table ?                    Men   Women ? 1/2 Average Risk   3.4   3.3 ? Average Risk       5.0   4.4 ? 2 X Average Risk   9.6   7.1 ? 3 X Average Risk  23.4   11.0 ?       ?Use the calculated Patient Ratio ?above and the CHD Risk Table ?to determine the patient's CHD Risk. ?       ?ATP III CLASSIFICATION (LDL): ? <100     mg/dL   Optimal ? 100-129  mg/dL   Near or Above ?                   Optimal ? 130-159  mg/dL   Borderline ? 160-189  mg/dL   High ? >190     mg/dL   Very High ?Performed at [facility], [address] [place], [place] [address] ?  ? ?HDL  ?Date Value Ref Range Status  ?05/04/2021 40 (L) >40 mg/dL Final  ?03/28/2021 41 >39 mg/dL Final  ? ?Triglycerides  ?Date Value Ref Range Status  ?05/04/2021 224 (H) <150 mg/dL Final  ? ?  ?  ?  Passed - Cr in normal range and within 360 days  ?  Creatinine, Ser  ?Date Value Ref Range Status  ?05/08/2021 0.87 0.44 - 1.00 mg/dL Final  ?   ?  ?  Passed - Patient is not pregnant  ?  ?  Passed - Valid encounter within last 12 months  ?  Recent Outpatient Visits   ?      ? 6 days ago Community acquired pneumonia of left lung, unspecified part of lung  ? [facility] Bacigalupo, Dionne Bucy, MD  ? 1 month ago Encounter for annual physical exam  ? [facility] [place], Dionne Bucy, MD  ? 6 months ago Chronic diarrhea of unknown origin  ? [facility] C[facility] Section, Kirstie Peri, MD  ? 1 year ago Encounter for annual physical exam  ? [facility], Dionne Bucy, MD  ? 1 year ago Essential hypertension  ? [facility] Bacigalupo, Dionne Bucy, MD  ?  ?  ?Future Appointments   ?        ? In 1 week Gollan, Kathlene November, MD [facility]  ? In 4 months Bacigalupo, Dionne Bucy, MD [facility], [facility]  ?  ? ?  ?  ?  ? ?

## 2021-05-24 NOTE — Progress Notes (Signed)
Cardiology Office Note ? ?Date:  05/29/2021  ? ?ID:  Natasha Vasquez, DOB March 04, 1940, MRN [id number] ? ?PCP:  Natasha Crews, MD  ? ?Chief Complaint  ?Patient presents with  ? New Patient (Initial Visit)  ?  Ref by Dr. Patsey Vasquez for shortness of breath and discuss Carotid & Echo results. Medications reviewed by the patient verbally.   ? ? 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 Patsey Vasquez for shortness of breath ? ?Recent records reviewed ?Recent hospitalization records reviewed for pneumonia ? ?Has been followed by pulmonary for shortness of breath ?Reports minimal smoking history but does report some secondhand exposure ? ? 2D echo 3/23 reviewed ? estimated EF of 60 to 65%, though on pulling up the images,  ?EF appears less than this  ?On apical images there appears to be hypokinesis of the lateral wall  ?No imaging is challenging ? ?Reports that she eats out almost every lunch at restaurants with her husband ?High salt and fluid intake ? ?Other imaging reviewed ?Chest CT scan ?LEFT upper lobe and lingular pneumonia. ?4.6 cm ascending thoracic aortic dilation is stable ?as measured in a similar fashion compared to the previous study, 4.3 ?cm in the coronal plane. Heart size is stable and mildly enlarged. ? ?Aorta 4.2 cm in July 2021 ?Aorta 4.4 cm dilation October 2020 ?Aorta 4.0 cm January 2020 ? ?Sedentary  ?Used to walk, would sit every 1/4 mile ?SOB >1 year ? ?Coughing up blood: small-volume hemoptysis ?Admitted 04/2021 for 4 days, low-grade fever of 100.1 ?referral to gastroenterology for evaluation of IBS and severe GERD ? ?Weight trending up up 6 to 7 pounds from February ? ?EKG personally reviewed by myself on todays visit ?NSR rate 76 bpm left anterior fascicular block ? ? 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 ? ?PSH:    ?Past Surgical History:  ?Procedure Laterality Date  ? ABDOMINAL HYSTERECTOMY    ? APPENDECTOMY    ? BREAST BIOPSY Right 04/25/2017  ? retroareolar 10:00   wing clip   path pending  ? BREAST BIOPSY Right 04/25/2017  ? 10:00  5CMFN  venus clip    path pending  ? BREAST BIOPSY Right 05/03/2017  ? Affirm Bx- path pending  ? BREAST CYST ASPIRATION Bilateral   ? NEG  ? BREAST EXCISIONAL BIOPSY Left 20+ yrs ago  ? NEG  ? BREAST RECONSTRUCTION WITH PLACEMENT OF TISSUE EXPANDER AND FLEX HD (ACELLULAR HYDRATED DERMIS) Right 06/03/2017  ? Procedure: BREAST RECONSTRUCTION WITH PLACEMENT OF TISSUE EXPANDER AND FLEX HD (ACELLULAR HYDRATED DERMIS);  Surgeon: Wallace Going, DO;  Location: [facility];  Service: Plastics;  Laterality: Right;  ? BREAST REDUCTION SURGERY Left 11/06/2017  ? Procedure: BREAST REDUCTION;  Surgeon: Wallace Going, DO;  Location: [facility];  Service: Plastics;  Laterality: Left;  ? BROW LIFT Bilateral 02/01/2015  ? Procedure: BLEPHAROPLASTY bilateral upper eyelids.;  Surgeon: Karle Starch, MD;  Location: [place];  Service: Ophthalmology;  Laterality: Bilateral;  DIABETIC - diet controlled  ? CATARACT EXTRACTION W/PHACO Left 01/14/2019  ? Procedure: CATARACT EXTRACTION PHACO AND INTRAOCULAR LENS PLACEMENT (IOC) LEFT TECNIS TORIC ADD 5.62 00:46.5 30.0%;  Surgeon: Leandrew Koyanagi, MD;  Location: [place];  Service: Ophthalmology;  Laterality: Left;  diabetic - diet controlled  ?  CATARACT EXTRACTION W/PHACO Right 02/11/2019  ? Procedure: CATARACT EXTRACTION PHACO AND INTRAOCULAR LENS PLACEMENT (IOC) RIGHT DIABETIC 4.82 00:46.9 10.3%;  Surgeon: Leandrew Koyanagi, MD;  Location: [place];  Service: Ophthalmology;  Laterality: Right;  Diabetic - diet controlled  ? CHOLECYSTECTOMY    ? COLONOSCOPY    ? COLONOSCOPY WITH PROPOFOL N/A 05/11/2015  ? Procedure: COLONOSCOPY WITH PROPOFOL;  Surgeon: Manya Silvas, MD;  Location[facility];  Service: Endoscopy;  Laterality: N/A;  ? COLONOSCOPY WITH PROPOFOL N/A 09/07/2020  ? Procedure: COLONOSCOPY WITH PROPOFOL;  Surgeon: Toledo, Benay Pike, MD;  Location: [facility];  Service: Gastroenterology;  Laterality: N/A;  ? ESOPHAGOGASTRODUODENOSCOPY    ? ESOPHAGOGASTRODUODENOSCOPY (EGD) WITH PROPOFOL  05/11/2015  ? Procedure: ESOPHAGOGASTRODUODENOSCOPY (EGD) WITH PROPOFOL;  Surgeon: Manya Silvas, MD;  Location: [facility] END[facility]COPY;  Service: Endoscopy;;  ? FRACTURE SURGERY Right   ? arm and shoulder  ? JOINT REPLACEMENT    ? KNEE ARTHROSCOPY Left 03/14/2016  ? Procedure: ARTHROSCOPY KNEE, PARTIAL MEDIAL MENISECTOMY, CHONDROPLASTY;  Surgeon: Dereck Leep, MD;  Location: [facility];  Service: Orthopedics;  Laterality: Left;  ? MASTECTOMY Right 2019  ? MASTECTOMY W/ SENTINEL NODE BIOPSY Right 06/03/2017  ? Procedure: MASTECTOMY WITH SENTINEL LYMPH NODE BIOPSY;  Surgeon: Robert Bellow, MD;  Location: [facility];  Service: General;  Laterality: Right;  ? PARTIAL HIP ARTHROPLASTY Right   ? REDUCTION MAMMAPLASTY Left 2019  ? REMOVAL OF BILATERAL TISSUE EXPANDERS WITH PLACEMENT OF BILATERAL BREAST IMPLANTS Right 11/06/2017  ? Procedure: REMOVAL OF TISSUE EXPANDER WITH PLACEMENT OF BREAST IMPLANT;  Surgeon: Wallace Going, DO;  Location: [facility];  Service: Plastics;  Laterality: Right;  ? TONSILLECTOMY    ? ? ?Current Outpatient Medications  ?Medication Sig Dispense Refill  ? acetaminophen (TYLENOL) 500 MG tablet Take 500 mg by mouth every 6  (six) hours as needed.    ? albuterol (VENTOLIN HFA) 108 (90 Base) MCG/ACT inhaler TAKE 2 PUFFS BY MOUTH EVERY 6 HOURS AS NEEDED FOR WHEEZE OR SHORTNESS OF BREATH 8.5 each 2  ? allopurinol (ZYLOPRIM) 100 MG tablet TAKE 1 TABLET BY MOUTH EVERY DAY 90 tablet 0  ? alum & mag hydroxide-simeth (MAALOX/MYLANTA) 200-200-20 MG/5ML suspension Take 30 mLs by mouth every 4 (four) hours as needed for indigestion or heartburn. 355 mL 0  ? ARIPiprazole (ABILIFY) 2 MG tablet Take 1 tablet (2 mg total) by mouth daily. 90 tablet 1  ? aspirin 81 MG tablet Take 81 mg by mouth daily.    ? bismuth subsalicylate (PEPTO BISMOL) 262 MG chewable tablet Chew 262 mg by mouth as needed. **capsule**    ? calcium carbonate (TUMS - DOSED IN MG ELEMENTAL CALCIUM) 500 MG chewable tablet Chew 1 tablet by mouth as needed for indigestion or heartburn.    ? clobetasol ointment (TEMOVATE) 0.05 % APPLY TO AFFECTED AREAS AT NIGHT AS NEEDED    ? clotrimazole (LOTRIMIN) 1 % cream Apply 1 application topically 2 (two) times daily. 30 g 1  ? colestipol (COLESTID) 1 g tablet Take 1 g by mouth 2 (two) times daily.    ? famotidine (PEPCID) 20 MG tablet Take 1 tablet (20 mg total) by mouth 2 (  two) times daily. 60 tablet 0  ? ferrous sulfate 324 MG TBEC Take 324 mg by mouth daily with breakfast.    ? fluocinonide (LIDEX) 0.05 % external solution APPLY TO AFFECTED AREAS OF SCALP DAILY UNTIL CLEAR, THEN FOR FLARES    ? furosemide (LASIX) 20 MG tablet Take 1 tablet (20 mg total) by mouth daily. 90 tablet 3  ? lisinopril (ZESTRIL) 5 MG tablet TAKE 1 TABLET BY MOUTH EVERY DAY 90 tablet 0  ? metoprolol tartrate (LOPRESSOR) 100 MG tablet Take 1 tablet (100 mg total) by mouth once for 1 dose. Take two hours prior to your CT. 1 tablet 0  ? Multiple Vitamins-Minerals (CENTRUM SILVER PO) Take 1 tablet by mouth daily.    ? omeprazole (PRILOSEC) 40 MG capsule Take 1 capsule (40 mg total) by mouth 2 (two) times daily. 180 capsule 1  ? oxybutynin (DITROPAN-XL) 10 MG 24 hr tablet  TAKE 1 TABLET BY MOUTH EVERY DAY 90 tablet 0  ? potassium chloride (KLOR-CON) 10 MEQ tablet Take 1 tablet (10 mEq total) by mouth daily. 90 tablet 3  ? rosuvastatin (CRESTOR) 10 MG tablet TAKE 1 TABLET BY M

## 2021-05-27 ENCOUNTER — Other Ambulatory Visit: Payer: Self-pay | Admitting: Pulmonary Disease

## 2021-05-29 ENCOUNTER — Ambulatory Visit: Payer: Medicare PPO | Admitting: Cardiovascular Disease

## 2021-05-29 ENCOUNTER — Encounter: Payer: Self-pay | Admitting: Cardiovascular Disease

## 2021-05-29 VITALS — BP 120/80 | HR 76 | Ht 66.0 in | Wt 191.1 lb

## 2021-05-29 DIAGNOSIS — R0602 Shortness of breath: Secondary | ICD-10-CM | POA: Diagnosis not present

## 2021-05-29 DIAGNOSIS — E1159 Type 2 diabetes mellitus with other circulatory complications: Secondary | ICD-10-CM | POA: Diagnosis not present

## 2021-05-29 DIAGNOSIS — I152 Hypertension secondary to endocrine disorders: Secondary | ICD-10-CM | POA: Diagnosis not present

## 2021-05-29 DIAGNOSIS — E782 Mixed hyperlipidemia: Secondary | ICD-10-CM | POA: Diagnosis not present

## 2021-05-29 DIAGNOSIS — R072 Precordial pain: Secondary | ICD-10-CM

## 2021-05-29 DIAGNOSIS — J432 Centrilobular emphysema: Secondary | ICD-10-CM

## 2021-05-29 DIAGNOSIS — I7121 Aneurysm of the ascending aorta, without rupture: Secondary | ICD-10-CM | POA: Diagnosis not present

## 2021-05-29 DIAGNOSIS — I739 Peripheral vascular disease, unspecified: Secondary | ICD-10-CM | POA: Diagnosis not present

## 2021-05-29 MED ORDER — METOPROLOL TARTRATE 100 MG PO TABS: 100.0000 mg | ORAL_TABLET | Freq: Once | ORAL | 0 refills | Status: DC

## 2021-05-29 MED ORDER — FUROSEMIDE 20 MG PO TABS: 20.0000 mg | ORAL_TABLET | Freq: Every day | ORAL | 3 refills | Status: DC

## 2021-05-29 MED ORDER — POTASSIUM CHLORIDE ER 10 MEQ PO TBCR: 10.0000 meq | EXTENDED_RELEASE_TABLET | Freq: Every day | ORAL | 3 refills | Status: DC

## 2021-05-29 NOTE — Patient Instructions (Addendum)
Moderate fluid intake,  ?cut back on restaurant food and drink ? ?Medication Instructions:  ?Please start lasix 20 mg daily with potassium 10 meq daily ? ?If you need a refill on your cardiac medications before your next appointment, please call your pharmacy.  ? ?Lab work: ?No new labs needed ? ?Testing/Procedures: ?Your cardiac CT has been scheduled for May 22 at 11 am at:  ? ?[facility] [address] ?[address] ?[place], [place] [address] ?[phone] ?Please arrive 15 mins early for check-in and test prep. ? ? ? ?Please follow these instructions carefully (unless otherwise directed): ? ? ?On the Night Before the Test: ?Be sure to Drink plenty of water. ?Do not consume any caffeinated/decaffeinated beverages or chocolate 12 hours prior to your test. ? ?On the Day of the Test: ?Drink plenty of water until 1 hour prior to the test. ?Do not eat any food 4 hours prior to the test. ?You may take your regular medications prior to the test.  ?Take metoprolol (Lopressor) 100 mg two hours prior to test. This has been sent into your pharmacy. ?HOLD Furosemide/Hydrochlorothiazide morning of the test. ?FEMALES- please wear underwire-free bra if available, avoid dresses & tight clothing ? ?After the Test: ?Drink plenty of water. ?After receiving IV contrast, you may experience a mild flushed feeling. This is normal. ?On occasion, you may experience a mild rash up to 24 hours after the test. This is not dangerous. If this occurs, you can take Benadryl 25 mg and increase your fluid intake. ?If you experience trouble breathing, this can be serious. If it is severe call 911 IMMEDIATELY. If it is mild, please call our office. ? ? ?Please allow 2-4 weeks for scheduling of routine cardiac CTs. Some insurance companies require a pre-authorization which may delay scheduling of this test.  ? ?For non-scheduling related questions, please contact the cardiac imaging nurse navigator should you have  any questions/concerns: ?Marchia Bond, Cardiac Imaging Nurse Navigator ?Gordy Clement, Cardiac Imaging Nurse Navigator ?[facility] ?Direct Office Dial: [phone]  ? ?For scheduling needs, including cancellations and rescheduling, please call [place], [phone].   ? ?Follow-Up: ?At [facility], you and your health needs are our priority.  As part of our continuing mission to provide you with exceptional heart care, we have created designated Provider Care Teams.  These Care Teams include your primary Cardiologist (physician) and Advanced Practice Providers (APPs -  Physician Assistants and Nurse Practitioners) who all work together to provide you with the care you need, when you need it. ? ?You will need a follow up appointment in 2 months ? ?Providers on your designated Care Team:   ?Murray Hodgkins, NP ?Christell Faith, PA-C ?Cadence Kathlen Mody, PA-C ? ?COVID-19 Vaccine Information can be found at: [email or web address].[email or web address] For questions related to vaccine distribution or appointments, please email [email or web address] or call 2483741829.  ? ?

## 2021-05-31 ENCOUNTER — Telehealth (HOSPITAL_COMMUNITY): Payer: Self-pay | Admitting: *Deleted

## 2021-05-31 ENCOUNTER — Encounter: Payer: Self-pay | Admitting: Family Medicine

## 2021-05-31 MED ORDER — FAMOTIDINE 20 MG PO TABS: 20.0000 mg | ORAL_TABLET | Freq: Two times a day (BID) | ORAL | 1 refills | Status: DC

## 2021-05-31 NOTE — Telephone Encounter (Signed)
Reaching out to patient to offer assistance regarding upcoming cardiac imaging study; pt verbalizes understanding of appt date/time, parking situation and where to check in, pre-test NPO status and medications ordered, and verified current allergies; name and call back number provided for further questions should they arise ? ?Antha Niday RN Navigator Cardiac Imaging ?[facility] ?[phone] office ?[phone] cell ? ?Patient to take 100mg metoprolol tartrate two hours prior to her cardiac CT scan.  ?

## 2021-06-01 ENCOUNTER — Ambulatory Visit
Admission: RE | Admit: 2021-06-01 | Discharge: 2021-06-01 | Disposition: A | Discharge status: Home / Self Care | Payer: Medicare PPO | Source: Ambulatory Visit | Attending: Cardiovascular Disease | Admitting: Cardiovascular Disease

## 2021-06-01 DIAGNOSIS — R072 Precordial pain: Secondary | ICD-10-CM | POA: Insufficient documentation

## 2021-06-01 MED ORDER — IOHEXOL 350 MG/ML SOLN
75.0000 mL | Freq: Once | INTRAVENOUS | Status: AC | PRN
  Administered 2021-06-01: 75 mL via INTRAVENOUS

## 2021-06-01 MED ORDER — NITROGLYCERIN 0.4 MG SL SUBL
0.8000 mg | SUBLINGUAL_TABLET | Freq: Once | SUBLINGUAL | Status: AC
  Administered 2021-06-01: 0.8 mg via SUBLINGUAL

## 2021-06-01 NOTE — Progress Notes (Signed)
Patient tolerated CT well. Drank water after. Vital signs stable encourage to drink water throughout day.Reasons explained and verbalized understanding. Ambulated steady gait.  

## 2021-06-05 ENCOUNTER — Inpatient Hospital Stay: Admission: RE | Admit: 2021-06-05 | Payer: Medicare PPO | Source: Ambulatory Visit

## 2021-06-14 ENCOUNTER — Telehealth: Payer: Self-pay | Admitting: Pulmonary Disease

## 2021-06-15 NOTE — Telephone Encounter (Signed)
Since we have a hard time talking with anyone from [facility] I have faxed over this message asking them to contact the patient again about scheduling

## 2021-06-16 ENCOUNTER — Encounter: Payer: Self-pay | Admitting: Cardiovascular Disease

## 2021-06-16 ENCOUNTER — Ambulatory Visit
Admission: RE | Admit: 2021-06-16 | Discharge: 2021-06-16 | Disposition: A | Discharge status: Home / Self Care | Payer: Medicare PPO | Source: Ambulatory Visit | Attending: General Surgery | Admitting: General Surgery

## 2021-06-16 DIAGNOSIS — Z1231 Encounter for screening mammogram for malignant neoplasm of breast: Secondary | ICD-10-CM | POA: Insufficient documentation

## 2021-06-16 DIAGNOSIS — Z1239 Encounter for other screening for malignant neoplasm of breast: Secondary | ICD-10-CM

## 2021-06-27 DIAGNOSIS — D0511 Intraductal carcinoma in situ of right breast: Secondary | ICD-10-CM | POA: Diagnosis not present

## 2021-06-28 ENCOUNTER — Ambulatory Visit: Payer: Medicare PPO | Admitting: Pulmonary Disease

## 2021-06-28 ENCOUNTER — Encounter: Payer: Self-pay | Admitting: Pulmonary Disease

## 2021-06-28 VITALS — BP 130/84 | HR 78 | Temp 97.8°F | Ht 64.0 in | Wt 190.4 lb

## 2021-06-28 DIAGNOSIS — G479 Sleep disorder, unspecified: Secondary | ICD-10-CM | POA: Diagnosis not present

## 2021-06-28 DIAGNOSIS — R0600 Dyspnea, unspecified: Secondary | ICD-10-CM | POA: Diagnosis not present

## 2021-06-28 DIAGNOSIS — J45998 Other asthma: Secondary | ICD-10-CM | POA: Diagnosis not present

## 2021-06-28 MED ORDER — FLUTICASONE FUROATE-VILANTEROL 100-25 MCG/ACT IN AEPB: 1.0000 | INHALATION_SPRAY | Freq: Every day | RESPIRATORY_TRACT | 6 refills | Status: DC

## 2021-06-28 NOTE — Patient Instructions (Signed)
Overall your lung function is not bad.  You do have some degree of asthma noted on today's exam and on the test that we did today.  I am giving you a trial of an inhaler called Breo Ellipta.  This is 1 puff daily make sure you rinse your mouth well after you use it.  I want to see if this helps you with your breathing.  Continue to try to stay as active as possible.  We will see you in follow-up in 3 months time call sooner should any new problems arise.

## 2021-06-28 NOTE — Progress Notes (Signed)
Subjective:    Patient ID: Natasha Vasquez, female    DOB: [birth year], 81 y.o.   MRN: [id number] Patient Care Team: Erasmo Downer, MD as PCP - General (Family Medicine) Tera Partridge, PA as Physician Assistant (Physician Assistant) Ernest Pine, Illene Labrador, MD as Consulting Physician (Orthopedic Surgery) Byrnett, Merrily Pew, MD (General Surgery) Dasher, Cliffton Asters, MD (Dermatology) Earna Coder, MD as Consulting Physician (Internal Medicine) Lockie Mola, MD as Referring Physician (Ophthalmology) Salena Saner, MD as Consulting Physician (Pulmonary Disease) Loreli Slot, MD as Consulting Physician (Cardiothoracic Surgery)  Chief Complaint  Patient presents with   Follow-up    C/o dry cough, SOB with exertion and occ wheezing.     HPI The patient is an 81 year old very remote former smoker with minimal tobacco history, who presents for follow-up on the issue of dyspnea and abnormal CT scanning of the chest with nonspecific groundglass opacities noted previously.  We last saw her on 27 April 2021.  After that visit she was admitted to [facility] on 20 April through 24 April for a left upper lobe and lingular pneumonia.  She was noted to have significant reflux and has had an episode of actual regurgitation in the middle of the night prior to admission.  Suspect her pneumonia was secondary to aspiration.  She has been since placed on a PPI and is following antireflux measures.  She still has a sleep study pending for sleep disordered breathing and significant desaturations at nighttime.  She continues to have issues with a dry cough since her discharge.  Shortness of breath on exertion and occasional wheezing.  She had a coronary CT on 01 Jun 2021 that showed that her left upper lobe/lingular process had resolved.  She has not had any fevers, chills or sweats since her discharge.  She does not note any new complaint from prior.  She has not had any orthopnea or paroxysmal  nocturnal dyspnea.  No lower extremity edema.  No calf tenderness.  DATA: 04/03/2021 echocardiogram: LVEF 60 to 65%, no regional wall motion abnormalities.  Moderate LVH.  Grade 1 DD.  RV function normal. 04/20/2021 PFTs: FEV1 1.95 L or 99% predicted, FVC 2.63 L or 102% predicted, FEV1/FVC 74%, no bronchodilator response.  Lung volumes normal with minimal air trapping.  Diffusion capacity mildly reduced.  Overall normal study. 05/04/2021 CT angio chest: Left upper lobe and lingular pneumonia, no pulmonary embolism (performed during admission to [facility]). 06/01/2021 CT coronary: Coronary score 0, no overt pulmonary findings on lung windows, small hiatal hernia.  Review of Systems A 10 point review of systems was performed and it is as noted above otherwise negative.  Patient Active Problem List   Diagnosis Date Noted   SOB (shortness of breath) 05/16/2021   Aneurysm of ascending aorta without rupture (HCC) 05/10/2021   CAP (community acquired pneumonia) 05/04/2021   HLD (hyperlipidemia) 05/04/2021   Elevated troponin 05/04/2021   Chronic diastolic CHF (congestive heart failure) (HCC) 05/04/2021   Recurrent major depressive disorder, in partial remission (HCC) 03/24/2021   Gout 03/24/2021   PAD (peripheral artery disease) (HCC) 06/24/2020   Iron deficiency 10/07/2018   Primary osteoarthritis of left knee 09/17/2018   Emphysema lung (HCC) 03/14/2018   Diabetic peripheral neuropathy associated with type 2 diabetes mellitus (HCC) 03/14/2018   Metatarsalgia of both feet 03/14/2018   Abnormality of lung 03/14/2018   Acquired absence of right breast 06/11/2017   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   Dysphagia 03/11/2017   Obesity 03/11/2017   Subclinical hypothyroidism 06/08/2015   Osteopenia 06/08/2015   Billowing mitral valve 06/08/2015   Melanoma in situ (HCC) 06/08/2015   Cramps of  lower extremity 06/08/2015   Type 2 diabetes mellitus (HCC) 05/18/2015   Chronic diarrhea 05/18/2015   Anemia 05/18/2015   Clinical depression 01/26/2015   Hyperlipidemia associated with type 2 diabetes mellitus (HCC) 10/28/2014   Hypertension associated with diabetes (HCC) 09/27/2014   GERD (gastroesophageal reflux disease) 08/06/2014   Overactive bladder 07/16/2014   Social History   Tobacco Use   Smoking status: Former    Years: 2.00    Types: Cigarettes    Quit date: 1965    Years since quitting: 58.4   Smokeless tobacco: Never   Tobacco comments:    smoked in college--1 pack would last one week.  Substance Use Topics   Alcohol use: Yes    Alcohol/week: 7.0 - 14.0 standard drinks of alcohol    Types: 7 - 14 Glasses of wine per week    Comment: 1-2 glasses of wine per night   Allergies  Allergen Reactions   Codeine Other (See Comments)    Chest pain   Lorazepam Other (See Comments)    "Feels loopy"   Oxycodone Other (See Comments)    MENTAL CHANGES   Paregoric     Chest pain---tolerated MORPHINE   Penicillins Hives    Has patient had a PCN reaction causing immediate rash, facial/tongue/throat swelling, SOB or lightheadedness with hypotension: Yes Has patient had a PCN reaction causing severe rash involving mucus membranes or skin necrosis: No Has patient had a PCN reaction that required hospitalization: NO Has patient had a PCN reaction occurring within the last 10 years: No If all of the above answers are "NO", then may proceed with Cephalosporin use.  Can take Augmentin   Quinolones     PATIENT UNAWARE OF allergy to this class of antibiotics, only aware of allergy to [facility] Patient was warned about not using Cipro and similar antibiotics. Recent studies have raised concern that fluoroquinolone antibiotics could be associated with an increased risk of aortic aneurysm Fluoroquinolones have non-antimicrobial properties that might jeopardise the integrity of the  extracellular matrix of the vascular wall In a  propensity score matched cohort study in [place], there was a 66% increased rate   Aleve [Naproxen Sodium] Hives, Rash and Other (See Comments)    headaches   Current Meds  Medication Sig   acetaminophen (TYLENOL) 500 MG tablet Take 500 mg by mouth every 6 (six) hours as needed.   albuterol (VENTOLIN HFA) 108 (90 Base) MCG/ACT inhaler TAKE 2 PUFFS BY MOUTH EVERY 6 HOURS AS NEEDED FOR WHEEZE OR SHORTNESS OF BREATH   allopurinol (ZYLOPRIM) 100 MG tablet TAKE 1 TABLET BY MOUTH EVERY DAY   alum & mag hydroxide-simeth (MAALOX/MYLANTA) 200-200-20 MG/5ML suspension Take 30 mLs by mouth every 4 (four) hours as needed for indigestion or heartburn.   ARIPiprazole (ABILIFY) 2 MG tablet Take 1 tablet (2 mg total) by mouth daily.   aspirin 81 MG tablet Take 81 mg by mouth daily.   bismuth subsalicylate (PEPTO BISMOL) 262 MG chewable tablet Chew 262 mg by mouth as needed. **capsule**   calcium carbonate (TUMS - DOSED IN MG ELEMENTAL CALCIUM) 500 MG chewable tablet Chew 1 tablet by mouth as needed for indigestion or heartburn.   clobetasol ointment (TEMOVATE) 0.05 % APPLY TO AFFECTED AREAS  AT NIGHT AS NEEDED   clotrimazole (LOTRIMIN) 1 % cream Apply 1 application topically 2 (two) times daily.   colestipol (COLESTID) 1 g tablet Take 1 g by mouth 2 (two) times daily.   famotidine (PEPCID) 20 MG tablet Take 1 tablet (20 mg total) by mouth 2 (two) times daily.   ferrous sulfate 324 MG TBEC Take 324 mg by mouth daily with breakfast.   fluocinonide (LIDEX) 0.05 % external solution APPLY TO AFFECTED AREAS OF SCALP DAILY UNTIL CLEAR, THEN FOR FLARES   furosemide (LASIX) 20 MG tablet Take 1 tablet (20 mg total) by mouth daily.   lisinopril (ZESTRIL) 5 MG tablet TAKE 1 TABLET BY MOUTH EVERY DAY   Multiple Vitamins-Minerals (CENTRUM SILVER PO) Take 1 tablet by mouth daily.   omeprazole (PRILOSEC) 40 MG capsule Take 1 capsule (40 mg total) by mouth 2 (two) times daily.    oxybutynin (DITROPAN-XL) 10 MG 24 hr tablet TAKE 1 TABLET BY MOUTH EVERY DAY   potassium chloride (KLOR-CON) 10 MEQ tablet Take 1 tablet (10 mEq total) by mouth daily.   rosuvastatin (CRESTOR) 10 MG tablet TAKE 1 TABLET BY MOUTH EVERY DAY   Venlafaxine HCl 225 MG TB24 TAKE 1 TABLET BY MOUTH EVERY DAY   Immunization History  Administered Date(s) Administered   Fluad Quad(high Dose 65+) 09/09/2018, 10/12/2019, 10/14/2020   Influenza Split 10/24/2011   Influenza, High Dose Seasonal PF 11/18/2013, 01/24/2016, 10/08/2016, 09/12/2017   Influenza-Unspecified 10/24/2011, 10/14/2012, 11/18/2013   Moderna Sars-Covid-2 Vaccination 01/30/2019, 03/02/2019   Pneumococcal Conjugate-13 08/21/2013   Pneumococcal Polysaccharide-23 01/24/2016   Tdap 10/24/2011   Zoster Recombinat (Shingrix) 04/01/2016, 08/14/2016       Objective:   Physical Exam BP 130/84 (BP Location: Left Arm, Cuff Size: Large)   Pulse 78   Temp 97.8 F (36.6 C) (Temporal)   Ht 5\' 6"  (1.676 m)   Wt 190 lb 6.4 oz (86.4 kg)   SpO2 96%   BMI 30.73 kg/m   SpO2: 96 % Room air.   GENERAL: Overweight woman, spry, age-appropriate, fully ambulatory, mild conversational dyspnea. HEAD: Normocephalic, atraumatic.  EYES: Pupils equal, round, reactive to light.  No scleral icterus.  MOUTH: Oral mucosa moist, no thrush. NECK: Supple. No thyromegaly. Trachea midline. No JVD.  No adenopathy.  Faint bilateral carotid bruits. PULMONARY: Mild tachypnea, good air entry bilaterally.  Faint end expiratory wheezes noted. CARDIOVASCULAR: S1 and S2. Regular rate and rhythm.  Has a grade 2/6 systolic ejection murmur in the mitral position. ABDOMEN: Slightly protuberant otherwise benign MUSCULOSKELETAL: No joint deformity, no clubbing, no edema.  NEUROLOGIC: No overt focal deficit, no gait disturbance, speech is fluent. SKIN: Intact,warm,dry. PSYCH: Mood and behavior normal.   Spirometry was performed today: patient had difficulty doing the test  due to coughing.  The most consistent maneuvers reveal mild obstructive defect.     Assessment & Plan:     ICD-10-CM   1. Dyspnea, unspecified type  R06.00 Spirometry with Graph   Spirometry consistent with mild obstruction today Suspect poorly compensated asthma Other factors: Deconditioning, diastolic dysfunction    2. Asthma, persistent not controlled  J45.998    Trial of Breo Ellipta 100, 1 puff daily As needed albuterol    3. Disordered sleep  G47.9    Sleep study pending     Orders Placed This Encounter  Procedures   Spirometry with Graph    Order Specific Question:   Where should this test be performed?    Answer:   [facility]  Meds ordered this encounter  Medications   fluticasone furoate-vilanterol (BREO ELLIPTA) 100-25 MCG/ACT AEPB    Sig: Inhale 1 puff into the lungs daily.    Dispense:  28 each    Refill:  6   Will see the patient in follow-up in 3 months time she is to contact us prior to that time should any new difficulties arise.  Gailen Shelter, MD Advanced Bronchoscopy PCCM [facility]    *This note was dictated using voice recognition software/Dragon.  Despite best efforts to proofread, errors can occur which can change the meaning. Any transcriptional errors that result from this process are unintentional and may not be fully corrected at the time of dictation.

## 2021-06-30 ENCOUNTER — Other Ambulatory Visit: Payer: Self-pay | Admitting: Family Medicine

## 2021-06-30 DIAGNOSIS — I10 Essential (primary) hypertension: Secondary | ICD-10-CM

## 2021-06-30 NOTE — Telephone Encounter (Signed)
Requested Prescriptions  Pending Prescriptions Disp Refills  . lisinopril (ZESTRIL) 5 MG tablet [Pharmacy Med Name: LISINOPRIL 5 MG TABLET] 90 tablet 0    Sig: TAKE 1 TABLET BY MOUTH EVERY DAY     Cardiovascular:  ACE Inhibitors Passed - 06/30/2021  2:08 AM      Passed - Cr in normal range and within 180 days    Creatinine, Ser  Date Value Ref Range Status  05/08/2021 0.87 0.44 - 1.00 mg/dL Final         Passed - K in normal range and within 180 days    Potassium  Date Value Ref Range Status  05/08/2021 3.9 3.5 - 5.1 mmol/L Final         Passed - Patient is not pregnant      Passed - Last BP in normal range    BP Readings from Last 1 Encounters:  06/28/21 130/84         Passed - Valid encounter within last 6 months    Recent Outpatient Visits          1 month ago Community acquired pneumonia of left lung, unspecified part of lung   [facility], Dionne Bucy, MD   3 months ago Encounter for annual physical exam   [facility] [place], Dionne Bucy, MD   7 months ago Chronic diarrhea of unknown origin   [place], Kirstie Peri, MD   1 year ago Encounter for annual physical exam   [facility] [place], Dionne Bucy, MD   1 year ago Essential hypertension   [place], Dionne Bucy, MD      Future Appointments            In 1 month Gollan, Kathlene November, MD [facility]   In 2 months Bacigalupo, Dionne Bucy, MD [facility], [place]

## 2021-07-05 ENCOUNTER — Other Ambulatory Visit: Payer: Self-pay | Admitting: Family Medicine

## 2021-07-05 DIAGNOSIS — M1A9XX0 Chronic gout, unspecified, without tophus (tophi): Secondary | ICD-10-CM

## 2021-07-05 DIAGNOSIS — E1169 Type 2 diabetes mellitus with other specified complication: Secondary | ICD-10-CM

## 2021-07-05 DIAGNOSIS — N3281 Overactive bladder: Secondary | ICD-10-CM

## 2021-07-31 NOTE — Progress Notes (Unsigned)
Cardiology Office Note  Date:  08/01/2021   ID:  Natasha Vasquez, DOB 1940/07/20, MRN [id number]  PCP:  Virginia Crews, MD   Chief Complaint  Patient presents with   2 month follow up     Discuss the CTA scan results. Patient c/o shortness of breath. Medications reviewed by the patient verbally.     HPI:  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 in April 2023, no change compared to 2020 Who presents for follow-up of her dilated aorta and shortness of breath  LOV 5/23 On today's visit we reviewed her cardiac studies in detail 5/23: Normal cardiac CTA, mild to moderate dilation of ascending aorta 4.4 cm No significant coronary artery disease, calcium score 0 Echo 3/23: normal EF, no significant valvular heart disease Echo 2021 normal ejection fraction no significant valvular heart disease  Recent records reviewed CT scans reviewed August 2022 groundglass opacities Pneumonia on CT scan 4/23, groundglass opacities  Reports she is taking Breo daily, continues to have raspy voice, shortness of breath on exertion Minimal smoking history Used 2to exercise but has not been doing so recently Lives at [facility], previously walking on the track  No EKG on today's visit  Other imaging reviewed Chest CT scan LEFT upper lobe and lingular pneumonia. 4.6 cm ascending thoracic aortic dilation is stable as measured in a similar fashion compared to the previous study, 4.3 cm in the coronal plane. Heart size is stable and mildly enlarged.  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  PSH:    Past Surgical History:  Procedure Laterality Date   ABDOMINAL HYSTERECTOMY     APPENDECTOMY     BREAST BIOPSY Right 04/25/2017   retroareolar 10:00   wing clip   path pending   BREAST BIOPSY Right 04/25/2017   10:00  5CMFN  venus clip    path pending   BREAST BIOPSY Right 05/03/2017   Affirm Bx- path pending   BREAST CYST ASPIRATION Bilateral    NEG   BREAST EXCISIONAL BIOPSY Left 20+ yrs ago   NEG   BREAST RECONSTRUCTION WITH PLACEMENT OF TISSUE EXPANDER AND FLEX HD (ACELLULAR HYDRATED DERMIS) Right 06/03/2017   Procedure: BREAST RECONSTRUCTION WITH PLACEMENT OF TISSUE EXPANDER AND FLEX HD (ACELLULAR HYDRATED DERMIS);  Surgeon: Wallace Going, DO;  Location: [facility];  Service: Plastics;  Laterality: Right;   BREAST REDUCTION SURGERY Left 11/06/2017   Procedure: BREAST REDUCTION;  Surgeon: Wallace Going, DO;  Location: [facility];  Service: Plastics;  Laterality: Left;   BROW LIFT Bilateral 02/01/2015   Procedure: BLEPHAROPLASTY bilateral upper eyelids.;  Surgeon: Karle Starch, MD;  Location: [place];  Service: Ophthalmology;  Laterality: Bilateral;  DIABETIC - diet controlled  CATARACT EXTRACTION W/PHACO Left 01/14/2019   Procedure: CATARACT EXTRACTION PHACO AND INTRAOCULAR LENS PLACEMENT (IOC) LEFT TECNIS TORIC ADD 5.62 00:46.5 30.0%;  Surgeon: Leandrew Koyanagi, MD;  Location: [place];  Service: Ophthalmology;  Laterality:  Left;  diabetic - diet controlled   CATARACT EXTRACTION W/PHACO Right 02/11/2019   Procedure: CATARACT EXTRACTION PHACO AND INTRAOCULAR LENS PLACEMENT (IOC) RIGHT DIABETIC 4.82 00:46.9 10.3%;  Surgeon: Leandrew Koyanagi, MD;  Location: [place];  Service: Ophthalmology;  Laterality: Right;  Diabetic - diet controlled   CHOLECYSTECTOMY     COLONOSCOPY     COLONOSCOPY WITH PROPOFOL N/A 05/11/2015   Procedure: COLONOSCOPY WITH PROPOFOL;  Surgeon: Manya Silvas, MD;  Location: [facility];  Service: Endoscopy;  Laterality: N/A;   COLONOSCOPY WITH PROPOFOL N/A 09/07/2020   Procedure: COLONOSCOPY WITH PROPOFOL;  Surgeon: Tol[place], Benay Pike, MD;  Location: [facility];  Service: Gastroenterology;  Laterality: N/A;   ESOPHAGOGASTRODUODENOSCOPY     ESOPHAGOGASTRODUODENOSCOPY (EGD) WITH PROPOFOL  05/11/2015   Procedure: ESOPHAGOGASTRODUODENOSCOPY (EGD) WITH PROPOFOL;  Surgeon: Manya Silvas, MD;  Location: [facility] ENDOSCOPY;  Service: Endoscopy;;   FRACTURE SURGERY Right    arm and shoulder   JOINT REPLACEMENT     KNEE ARTHROSCOPY Left 03/14/2016   Procedure: ARTHROSCOPY KNEE, PARTIAL MEDIAL MENISECTOMY, CHONDROPLASTY;  Surgeon: Dereck Leep, MD;  Location: [facility];  Service: Orthopedics;  Laterality: Left;   MASTECTOMY Right 2019   MASTECTOMY W/ SENTINEL NODE BIOPSY Right 06/03/2017   Procedure: MASTECTOMY WITH SENTINEL LYMPH NODE BIOPSY;  Surgeon: Robert Bellow, MD;  Location: [facility];  Service: General;  Laterality: Right;   PARTIAL HIP ARTHROPLASTY Right    REDUCTION MAMMAPLASTY Left 2019   REMOVAL OF BILATERAL TISSUE EXPANDERS WITH PLACEMENT OF BILATERAL BREAST IMPLANTS Right 11/06/2017   Procedure: REMOVAL OF TISSUE EXPANDER WITH PLACEMENT OF BREAST IMPLANT;  Surgeon: Wallace Going, DO;  Location: [facility];  Service: Plastics;  Laterality: Right;   TONSILLECTOMY      Current Outpatient Medications  Medication Sig Dispense Refill   acetaminophen (TYLENOL) 500  MG tablet Take 500 mg by mouth every 6 (six) hours as needed.     albuterol (VENTOLIN HFA) 108 (90 Base) MCG/ACT inhaler TAKE 2 PUFFS BY MOUTH EVERY 6 HOURS AS NEEDED FOR WHEEZE OR SHORTNESS OF BREATH 8.5 each 2   allopurinol (ZYLOPRIM) 100 MG tablet TAKE 1 TABLET BY MOUTH EVERY DAY 90 tablet 1   alum & mag hydroxide-simeth (MAALOX/MYLANTA) 200-200-20 MG/5ML suspension Take 30 mLs by mouth every 4 (four) hours as needed for indigestion or heartburn. 355 mL 0   ARIPiprazole (ABILIFY) 2 MG tablet Take 1 tablet (2 mg total) by mouth daily. 90 tablet 1   aspirin 81 MG tablet Take 81 mg by mouth daily.     bismuth subsalicylate (PEPTO BISMOL) 262 MG chewable tablet Chew 262 mg by mouth as needed. **capsule**     calcium carbonate (TUMS - DOSED IN MG ELEMENTAL CALCIUM) 500 MG chewable tablet Chew 1 tablet by mouth as needed for indigestion or heartburn.     clobetasol ointment (TEMOVATE) 0.05 % APPLY TO AFFECTED AREAS AT NIGHT AS NEEDED     clotrimazole (LOTRIMIN) 1 % cream Apply 1 application topically 2 (two) times daily. 30 g 1   colestipol (COLESTID) 1 g tablet Take 1 g by mouth 2 (two) times daily.     famotidine (PEPCID) 20 MG tablet Take 1 tablet (20 mg total) by mouth 2 (two) times daily. 180 tablet 1  ferrous sulfate 324 MG TBEC Take 324 mg by mouth daily with breakfast.     fluocinonide (LIDEX) 0.05 % external solution APPLY TO AFFECTED AREAS OF SCALP DAILY UNTIL CLEAR, THEN FOR FLARES     fluticasone furoate-vilanterol (BREO ELLIPTA) 100-25 MCG/ACT AEPB Inhale 1 puff into the lungs daily. 28 each 6   furosemide (LASIX) 20 MG tablet Take 1 tablet (20 mg total) by mouth daily. 90 tablet 3   lisinopril (ZESTRIL) 5 MG tablet TAKE 1 TABLET BY MOUTH EVERY DAY 90 tablet 0   Multiple Vitamins-Minerals (CENTRUM SILVER PO) Take 1 tablet by mouth daily.     omeprazole (PRILOSEC) 40 MG capsule Take 1 capsule (40 mg total) by mouth 2 (two) times daily. 180 capsule 1   oxybutynin (DITROPAN-XL) 10 MG 24 hr  tablet TAKE 1 TABLET BY MOUTH EVERY DAY 90 tablet 1   potassium chloride (KLOR-CON) 10 MEQ tablet Take 1 tablet (10 mEq total) by mouth daily. 90 tablet 3   rosuvastatin (CRESTOR) 10 MG tablet TAKE 1 TABLET BY MOUTH EVERY DAY 90 tablet 2   Venlafaxine HCl 225 MG TB24 TAKE 1 TABLET BY MOUTH EVERY DAY 90 tablet 1   No current facility-administered medications for this visit.    Allergies:   Codeine, Lorazepam, Oxycodone, Paregoric, Penicillins, Quinolones, and Aleve [naproxen sodium]   Social History:  The patient  reports that she quit smoking about 58 years ago. Her smoking use included cigarettes. She has never used smokeless tobacco. She reports current alcohol use of about 7.0 - 14.0 standard drinks of alcohol per week. She reports that she does not use drugs.   Family History:   family history includes Atrial fibrillation in her father; Cancer in her brother and mother; Colon cancer in her mother; Diabetes in her father; Healthy in her son and son; Heart disease in her father; Hypertension in her father.   Review of Systems: Review of Systems  Constitutional: Negative.   HENT: Negative.    Respiratory:  Positive for shortness of breath.   Cardiovascular: Negative.   Gastrointestinal: Negative.   Musculoskeletal: Negative.   Neurological: Negative.   Psychiatric/Behavioral: Negative.    All other systems reviewed and are negative.   PHYSICAL EXAM: VS:  BP 120/90 (BP Location: Left Arm, Patient Position: Sitting, Cuff Size: Normal)   Pulse 76   Ht 5' 5.5" (1.664 m)   Wt 190 lb (86.2 kg)   SpO2 96%   BMI 31.14 kg/m  , BMI Body mass index is 31.14 kg/m. Constitutional:  oriented to person, place, and time. No distress.  HENT:  Head: Grossly normal Eyes:  no discharge. No scleral icterus.  Neck: No JVD, no carotid bruits  Cardiovascular: Regular rate and rhythm, no murmurs appreciated Pulmonary/Chest: Clear to auscultation bilaterally, no wheezes or rails Abdominal: Soft.   no distension.  no tenderness.  Musculoskeletal: Normal range of motion Neurological:  normal muscle tone. Coordination normal. No atrophy Skin: Skin warm and dry Psychiatric: normal affect, pleasant  Recent Labs: 03/28/2021: ALT 33; TSH 4.050 05/04/2021: B Natriuretic Peptide 88.0 05/08/2021: BUN 17; Creatinine, Ser 0.87; Hemoglobin 11.1; Platelets 331; Potassium 3.9; Sodium 141    Lipid Panel Lab Results  Component Value Date   CHOL 189 05/04/2021   HDL 40 (L) 05/04/2021   LDLCALC 104 (H) 05/04/2021   TRIG 224 (H) 05/04/2021    Wt Readings from Last 3 Encounters:  08/01/21 190 lb (86.2 kg)  06/28/21 190 lb 6.4 oz (86.4 kg)  05/29/21  191 lb 2 oz (86.7 kg)     ASSESSMENT AND PLAN:  Problem List Items Addressed This Visit       Cardiology Problems   Hypertension associated with diabetes ([place]) - Primary   Aneurysm of ascending aorta without rupture (HCC)   HLD (hyperlipidemia)   PAD (peripheral artery disease) (HCC)     Other   Emphysema lung (HCC)   SOB (shortness of breath)   Other Visit Diagnoses     Dyspnea, unspecified type         Shortness of breath Negative cardiac work-up, echo 2021 in 2023 relatively normal Cardiac CTA calcium score 0 no coronary artery disease Groundglass opacities on CT scan dating back August 2022 which have persisted Finding minimal relief on Breo and Ventolin as needed Recommend she continue close follow-up with pulmonary, restart her walking program for conditioning  Dilated ascending aorta Stable over the past 3 years Most recent CT scan  4.4 cm, unchanged from 2020  Essential hypertension Blood pressure is well controlled on today's visit. No changes made to the medications.    Total encounter time more than 30 minutes  Greater than 50% was spent in counseling and coordination of care with the patient    Signed, Esmond Plants, M.D., Ph.D. [place], [place]

## 2021-08-01 ENCOUNTER — Encounter: Payer: Self-pay | Admitting: Cardiovascular Disease

## 2021-08-01 ENCOUNTER — Ambulatory Visit: Payer: Medicare PPO | Admitting: Cardiovascular Disease

## 2021-08-01 VITALS — BP 120/90 | HR 76 | Ht 65.5 in | Wt 190.0 lb

## 2021-08-01 DIAGNOSIS — I739 Peripheral vascular disease, unspecified: Secondary | ICD-10-CM

## 2021-08-01 DIAGNOSIS — R0602 Shortness of breath: Secondary | ICD-10-CM | POA: Diagnosis not present

## 2021-08-01 DIAGNOSIS — I7121 Aneurysm of the ascending aorta, without rupture: Secondary | ICD-10-CM | POA: Diagnosis not present

## 2021-08-01 DIAGNOSIS — J432 Centrilobular emphysema: Secondary | ICD-10-CM | POA: Diagnosis not present

## 2021-08-01 DIAGNOSIS — E782 Mixed hyperlipidemia: Secondary | ICD-10-CM | POA: Diagnosis not present

## 2021-08-01 DIAGNOSIS — E1159 Type 2 diabetes mellitus with other circulatory complications: Secondary | ICD-10-CM | POA: Diagnosis not present

## 2021-08-01 DIAGNOSIS — I152 Hypertension secondary to endocrine disorders: Secondary | ICD-10-CM

## 2021-08-01 DIAGNOSIS — R0600 Dyspnea, unspecified: Secondary | ICD-10-CM

## 2021-08-01 NOTE — Patient Instructions (Signed)
Medication Instructions:  No changes  If you need a refill on your cardiac medications before your next appointment, please call your pharmacy.   Lab work: No new labs needed  Testing/Procedures: No new testing needed  Follow-Up: At [facility], you and your health needs are our priority.  As part of our continuing mission to provide you with exceptional heart care, we have created designated Provider Care Teams.  These Care Teams include your primary Cardiologist (physician) and Advanced Practice Providers (APPs -  Physician Assistants and Nurse Practitioners) who all work together to provide you with the care you need, when you need it.  You will need a follow up appointment in 12 months  Providers on your designated Care Team:   [name] Berge, NP Ryan Dunn, PA-C Cadence Furth, PA-C  COVID-19 Vaccine Information can be found at: [email or web address] For questions related to vaccine distribution or appointments, please email [email or web address] or call [phone].   

## 2021-08-14 ENCOUNTER — Ambulatory Visit: Payer: Medicare PPO | Attending: Pulmonary Disease

## 2021-08-14 DIAGNOSIS — Z6831 Body mass index (BMI) 31.0-31.9, adult: Secondary | ICD-10-CM | POA: Diagnosis not present

## 2021-08-14 DIAGNOSIS — I509 Heart failure, unspecified: Secondary | ICD-10-CM | POA: Diagnosis not present

## 2021-08-14 DIAGNOSIS — G4736 Sleep related hypoventilation in conditions classified elsewhere: Secondary | ICD-10-CM | POA: Diagnosis not present

## 2021-08-16 ENCOUNTER — Telehealth (INDEPENDENT_AMBULATORY_CARE_PROVIDER_SITE_OTHER): Payer: Self-pay | Admitting: Pulmonary Disease

## 2021-08-16 DIAGNOSIS — G4733 Obstructive sleep apnea (adult) (pediatric): Secondary | ICD-10-CM

## 2021-08-16 NOTE — Telephone Encounter (Signed)
Severe OSA - AHI 90/h  Central apneas emerged on CPAP  & persisted on BiPAP titrated upto 11/6  Suggest trial of autoCPAP first 5-15 , If central apneas persist , then trial of biPAP May need referral to sleep MD

## 2021-08-17 ENCOUNTER — Telehealth: Payer: Self-pay

## 2021-08-17 NOTE — Telephone Encounter (Signed)
Lm for [facility] to request sleep study.

## 2021-08-18 NOTE — Telephone Encounter (Signed)
Spoke to [place] with [facility] and requested sleep study.  Will await fax.

## 2021-08-21 NOTE — Telephone Encounter (Signed)
Spoke to patient and relayed below message/results. She stated that she would like to think about how she would like to proceed and she will call back with update.

## 2021-08-21 NOTE — Telephone Encounter (Signed)
Per Dr. Elsworth Soho: Severe OSA - AHI 90/h   Central apneas emerged on CPAP  & persisted on BiPAP titrated upto 11/6   Suggest trial of autoCPAP first 5-15 , If central apneas persist , then trial of biPAP May need referral to sleep MD  Because of the complexity of her sleep apnea which is quite severe and with a central component, recommend a BiPAP titration study or  an Adaptive Servo Ventilation titration study.  She also will benefit from evaluation by Dr. Halford Chessman as her sleep apnea is complex.

## 2021-08-21 NOTE — Telephone Encounter (Signed)
Sleep study has been received and placed in Dr. Domingo Dimes folder for review.

## 2021-08-24 NOTE — Telephone Encounter (Signed)
Spoke to patient for update. She stated that she is still doing some research and has not made a decision. She will call back with update.

## 2021-09-24 ENCOUNTER — Other Ambulatory Visit: Payer: Self-pay | Admitting: Family Medicine

## 2021-09-24 DIAGNOSIS — I10 Essential (primary) hypertension: Secondary | ICD-10-CM

## 2021-09-25 ENCOUNTER — Encounter: Payer: Self-pay | Admitting: Family Medicine

## 2021-09-25 ENCOUNTER — Ambulatory Visit: Payer: Medicare PPO | Admitting: Family Medicine

## 2021-09-25 VITALS — BP 117/78 | HR 98 | Temp 98.0°F | Resp 20 | Wt 189.9 lb

## 2021-09-25 DIAGNOSIS — E1142 Type 2 diabetes mellitus with diabetic polyneuropathy: Secondary | ICD-10-CM

## 2021-09-25 DIAGNOSIS — Z23 Encounter for immunization: Secondary | ICD-10-CM | POA: Diagnosis not present

## 2021-09-25 DIAGNOSIS — I152 Hypertension secondary to endocrine disorders: Secondary | ICD-10-CM | POA: Diagnosis not present

## 2021-09-25 DIAGNOSIS — M1A9XX0 Chronic gout, unspecified, without tophus (tophi): Secondary | ICD-10-CM

## 2021-09-25 DIAGNOSIS — J432 Centrilobular emphysema: Secondary | ICD-10-CM | POA: Diagnosis not present

## 2021-09-25 DIAGNOSIS — E1159 Type 2 diabetes mellitus with other circulatory complications: Secondary | ICD-10-CM

## 2021-09-25 DIAGNOSIS — R0602 Shortness of breath: Secondary | ICD-10-CM

## 2021-09-25 DIAGNOSIS — M5432 Sciatica, left side: Secondary | ICD-10-CM

## 2021-09-25 LAB — POCT GLYCOSYLATED HEMOGLOBIN (HGB A1C)
Est. average glucose Bld gHb Est-mCnc: 128
Hemoglobin A1C: 6.1 % — AB (ref 4.0–5.6)

## 2021-09-25 MED ORDER — ALLOPURINOL 200 MG PO TABS: 200.0000 mg | ORAL_TABLET | Freq: Every day | ORAL | 1 refills | Status: DC

## 2021-09-25 NOTE — Progress Notes (Unsigned)
   SUBJECTIVE:   CHIEF COMPLAINT / HPI:   Hypertension: - Medications: lasix, lisinopril - Compliance: good - Denies any SOB, CP, vision changes, LE edema, medication SEs, or symptoms of hypotension - Diet: has not been eating healthy recently, lots of carbs. - Exercise: difficult 2/2 pain, see below  Diabetes, Type 2 - Last A1c 6.2 04/2021 - Medications: none - Compliance: n/a - Checking BG at home: no - Diet: see above - Exercise: see above - Eye exam: UTD - Foot exam: due - Microalbumin: UTD - Statin: yes - Denies symptoms of hypoglycemia, polyuria, polydipsia, numbness extremities, foot ulcers/trauma  Emphysema/COPD - Medications: albuterol prn, breo - has upcoming appt with Pulm 9/14 - Compliance: good - doesn't feel breo is helping.   L Sciatic pain - difficult to walk 2/2 pain. Relieved by rest. No meds. Years ago was in PT, helped and relieved for several years. Pain returned few years ago. Radiates to L knee.   Gout - still on allopurinol '100mg'$ . Flares usually in 1-2nd MTP joints. Couldn't remember if she is supposed to increase allopurinol after last labs.    OBJECTIVE:   BP 117/78 (BP Location: Left Arm, Patient Position: Sitting, Cuff Size: Large)   Pulse 98   Temp 98 F (36.7 C) (Oral)   Resp 20   Wt 189 lb 14.4 oz (86.1 kg)   BMI 31.12 kg/m   Gen: well appearing, in NAD Card: RRR Lungs: CTAB MSK: no midline spinal tenderness. Full AROM in flexion, sidebending. Limited AROM in extension 2/2 pain. TTP over piriformis and bilateral lumbar paravertebral musculature. No tenderness over greater trochanter. 5/5 LE strength. No LE edema.  Ext: WWP, no edema   ASSESSMENT/PLAN:   Hypertension associated with diabetes ([place]) At goal. No changes to medications.   SOB (shortness of breath) Chronic, worse with exertion. Previously evaluated by cardiology with no cardiac source of dyspnea. Pulmonary groundglass opacities on CT, recently started on breo but  doesn't feel it is helping. Does have severe OSA on recent sleep study, hesitant to start CPAP, strongly recommended reconsidering. Per chart review, considering referral to complex sleep specialist given results. Has upcoming Pulm visit to discuss.   Gout Reviewed recent labs, agree with increase in allopurinol, increased dose sent to pharmacy.  Type 2 diabetes mellitus (HCC) A1c at goal, 6.1. Continue to monitor and control with diet.   Sciatic pain, left Chronic, with worsening few years ago. Refer back to PT.   Emphysema lung (HCC) Chronic. With ongoing shortness of breath (see separate problem), followed by Pulm. Continue current meds.      Myles Gip, DO

## 2021-09-27 DIAGNOSIS — M5432 Sciatica, left side: Secondary | ICD-10-CM | POA: Insufficient documentation

## 2021-09-27 NOTE — Assessment & Plan Note (Signed)
Chronic, worse with exertion. Previously evaluated by cardiology with no cardiac source of dyspnea. Pulmonary groundglass opacities on CT, recently started on breo but doesn't feel it is helping. Does have severe OSA on recent sleep study, hesitant to start CPAP, strongly recommended reconsidering. Per chart review, considering referral to complex sleep specialist given results. Has upcoming Pulm visit to discuss.

## 2021-09-27 NOTE — Assessment & Plan Note (Signed)
A1c at goal, 6.1. Continue to monitor and control with diet.

## 2021-09-27 NOTE — Assessment & Plan Note (Signed)
At goal. No changes to medications.

## 2021-09-27 NOTE — Assessment & Plan Note (Signed)
Reviewed recent labs, agree with increase in allopurinol, increased dose sent to pharmacy.

## 2021-09-27 NOTE — Assessment & Plan Note (Signed)
Chronic. With ongoing shortness of breath (see separate problem), followed by Pulm. Continue current meds.

## 2021-09-27 NOTE — Assessment & Plan Note (Signed)
Chronic, with worsening few years ago. Refer back to PT.

## 2021-09-28 ENCOUNTER — Encounter: Payer: Self-pay | Admitting: Pulmonary Disease

## 2021-09-28 ENCOUNTER — Ambulatory Visit: Payer: Medicare PPO | Admitting: Pulmonary Disease

## 2021-09-28 VITALS — BP 102/70 | HR 95 | Temp 97.9°F | Ht 64.0 in | Wt 192.0 lb

## 2021-09-28 DIAGNOSIS — G4731 Primary central sleep apnea: Secondary | ICD-10-CM

## 2021-09-28 DIAGNOSIS — R0600 Dyspnea, unspecified: Secondary | ICD-10-CM

## 2021-09-28 NOTE — Progress Notes (Signed)
Subjective:    Patient ID: Natasha Vasquez, female    DOB: 1940-02-17, 81 y.o.   MRN: [id number] Patient Care Team: Virginia Crews, MD as PCP - General (Family Medicine) Watt Climes, PA as Physician Assistant (Physician Assistant) Marry Guan, Laurice Record, MD as Consulting Physician (Orthopedic Surgery) Byrnett, Forest Gleason, MD (General Surgery) Dasher, Rayvon Char, MD (Dermatology) Cammie Sickle, MD as Consulting Physician (Internal Medicine) Leandrew Koyanagi, MD as Referring Physician (Ophthalmology) Tyler Pita, MD as Consulting Physician (Pulmonary Disease) Melrose Nakayama, MD as Consulting Physician (Cardiothoracic Surgery) Germaine Pomfret, [facility] as Pharmacist (Pharmacist)  Chief Complaint  Patient presents with   Follow-up    HPI She is an 81 year old very remote former smoker with minimal tobacco history, who presents for follow-up on the issue of dyspnea on exertion.  She has been noticed with complex sleep apnea and will need AVAPS for control of this issue she had the split-night sleep study on 18 August 2021.  Discussed the implications of her very severe sleep apnea which is complex. She has however not been able to use it reliably.    Previously we had given her a trial of Breo Ellipta she has not felt that this helps her much with dyspnea chest congestion.  She has not had any fevers, chills or sweats.  No cough.  No hemoptysis.  No chest pain, no paroxysmal nocturnal dyspnea. No edema or calf tenderness.  She does not endorse any other symptomatology today.  Mainly here to discuss sleep study results.  DATA: 04/03/2021 echocardiogram: LVEF 60 to 65%, no regional wall motion abnormalities.  Moderate LVH.  Grade 1 DD.  RV function normal. 04/20/2021 PFTs: FEV1 1.95 L or 99% predicted, FVC 2.63 L or 102% predicted, FEV1/FVC 74%, no bronchodilator response.  Lung volumes normal with minimal air trapping.  Diffusion capacity mildly reduced.  Overall normal  study. 05/04/2021 CT angio chest: Left upper lobe and lingular pneumonia, no pulmonary embolism 06/01/2021 CT coronary: Coronary score 0, no overt coronary findings on lung windows, small hiatal hernia. 08/18/2021 split-night sleep study: Consistent with severe obstructive sleep apnea and treatment emergent central sleep apnea, titration was not successful.  Eventually patient titrated to AVAPS.  Review of Systems A 10 point review of systems was performed and it is as noted above otherwise negative.  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 (HCC) 05/10/2021   CAP (community acquired pneumonia) 05/04/2021   HLD (hyperlipidemia) 05/04/2021   Elevated troponin 05/04/2021   Chronic diastolic CHF (congestive heart failure) ([place]) 05/04/2021   Recurrent major depressive disorder, in partial remission ([place]) 03/24/2021   Gout 03/24/2021   PAD (peripheral artery disease) ([place]) 06/24/2020   Iron deficiency 10/07/2018   Primary osteoarthritis of left knee 09/17/2018   Emphysema lung ([place]) 03/14/2018   Diabetic peripheral neuropathy associated with type 2 diabetes mellitus ([place]) 03/14/2018   Metatarsalgia of both feet 03/14/2018   Abnormality of lung 03/14/2018   Acquired absence of right breast 06/11/2017   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[place]) 05/09/2017   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   Dysphagia 03/11/2017   Obesity 03/11/2017   Subclinical hypothyroidism 06/08/2015   Osteopenia 06/08/2015   Billowing mitral valve 06/08/2015   Melanoma in situ ([place]) 06/08/2015   Cramps of lower extremity 06/08/2015   Type  2 diabetes mellitus ([place]) 05/18/2015   Chronic diarrhea 05/18/2015   Anemia 05/18/2015   Clinical depression 01/26/2015   Hyperlipidemia associated with type 2 diabetes mellitus ([place]) 10/28/2014    Hypertension associated with diabetes ([place]) 09/27/2014   GERD (gastroesophageal reflux disease) 08/06/2014   Overactive bladder 07/16/2014   Social History   Tobacco Use   Smoking status: Former    Years: 2.00    Types: Cigarettes    Quit date: 1965    Years since quitting: 59.2   Smokeless tobacco: Never   Tobacco comments:    smoked in college--1 pack would last one week.  Substance Use Topics   Alcohol use: Yes    Alcohol/week: 7.0 - 14.0 standard drinks of alcohol    Types: 7 - 14 Glasses of wine per week    Comment: 1-2 glasses of wine per night   Allergies  Allergen Reactions   Codeine Other (See Comments)    Chest pain   Lorazepam Other (See Comments)    "Feels loopy"   Oxycodone Other (See Comments)    MENTAL CHANGES   Paregoric     Chest pain---tolerated MORPHINE   Penicillins Hives    Has patient had a PCN reaction causing immediate rash, facial/tongue/throat swelling, SOB or lightheadedness with hypotension: Yes Has patient had a PCN reaction causing severe rash involving mucus membranes or skin necrosis: No Has patient had a PCN reaction that required hospitalization: NO Has patient had a PCN reaction occurring within the last 10 years: No If all of the above answers are "NO", then may proceed with Cephalosporin use.  Can take Augmentin   Quinolones     PATIENT UNAWARE OF allergy to this class of antibiotics, only aware of allergy to [facility] Patient was warned about not using Cipro and similar antibiotics. Recent studies have raised concern that fluoroquinolone antibiotics could be associated with an increased risk of aortic aneurysm Fluoroquinolones have non-antimicrobial properties that might jeopardise the integrity of the extracellular matrix of the vascular wall In a  propensity score matched cohort study in [place], there was a 66% increased rate   Aleve [Naproxen Sodium] Hives, Rash and Other (See Comments)    headaches   Current Meds  Medication Sig    acetaminophen (TYLENOL) 500 MG tablet Take 500 mg by mouth every 6 (six) hours as needed.   albuterol (VENTOLIN HFA) 108 (90 Base) MCG/ACT inhaler TAKE 2 PUFFS BY MOUTH EVERY 6 HOURS AS NEEDED FOR WHEEZE OR SHORTNESS OF BREATH   allopurinol 200 MG TABS Take 200 mg by mouth daily.   aspirin 81 MG tablet Take 81 mg by mouth daily.   bismuth subsalicylate (PEPTO BISMOL) 262 MG chewable tablet Chew 262 mg by mouth as needed. **capsule**   calcium carbonate (TUMS - DOSED IN MG ELEMENTAL CALCIUM) 500 MG chewable tablet Chew 1 tablet by mouth as needed for indigestion or heartburn.   colestipol (COLESTID) 1 g tablet Take 1 g by mouth 2 (two) times daily.   ferrous sulfate 324 MG TBEC Take 324 mg by mouth daily with breakfast.   fluocinonide (LIDEX) 0.05 % external solution APPLY TO AFFECTED AREAS OF SCALP DAILY UNTIL CLEAR, THEN FOR FLARES   furosemide (LASIX) 20 MG tablet Take 1 tablet (20 mg total) by mouth daily.   Multiple Vitamins-Minerals (CENTRUM SILVER PO) Take 1 tablet by mouth daily.   omeprazole (PRILOSEC) 40 MG capsule Take 1 capsule (40 mg total) by mouth 2 (two) times daily.   potassium chloride (  KLOR-CON) 10 MEQ tablet Take 1 tablet (10 mEq total) by mouth daily.   [DISCONTINUED] alum & mag hydroxide-simeth (MAALOX/MYLANTA) 200-200-20 MG/5ML suspension Take 30 mLs by mouth every 4 (four) hours as needed for indigestion or heartburn.   [DISCONTINUED] ARIPiprazole (ABILIFY) 2 MG tablet Take 1 tablet (2 mg total) by mouth daily.   clobetasol ointment (TEMOVATE) 0.05 % APPLY TO AFFECTED AREAS AT NIGHT AS NEEDED   clotrimazole (LOTRIMIN) 1 % cream Apply 1 application topically 2 (two) times daily.   famotidine (PEPCID) 20 MG tablet Take 1 tablet (20 mg total) by mouth 2 (two) times daily.   fluticasone furoate-vilanterol (BREO ELLIPTA) 100-25 MCG/ACT AEPB Inhale 1 puff into the lungs daily.   lisinopril (ZESTRIL) 5 MG tablet TAKE 1 TABLET BY MOUTH EVERY DAY   oxybutynin (DITROPAN-XL) 10 MG 24  hr tablet TAKE 1 TABLET BY MOUTH EVERY DAY   rosuvastatin (CRESTOR) 10 MG tablet TAKE 1 TABLET BY MOUTH EVERY DAY   Venlafaxine HCl 225 MG TB24 TAKE 1 TABLET BY MOUTH EVERY DAY   Immunization History  Administered Date(s) Administered   Fluad Quad(high Dose 65+) 09/09/2018, 10/12/2019, 10/14/2020, 09/25/2021   Influenza Split 10/24/2011   Influenza, High Dose Seasonal PF 11/18/2013, 01/24/2016, 10/08/2016, 09/12/2017   Influenza-Unspecified 10/24/2011, 10/14/2012, 11/18/2013   Moderna Sars-Covid-2 Vaccination 01/30/2019, 03/02/2019   Pneumococcal Conjugate-13 08/21/2013   Pneumococcal Polysaccharide-23 01/24/2016   Respiratory Syncytial Virus Vaccine,Recomb Aduvanted(Arexvy) 11/11/2021   Tdap 10/24/2011   Zoster Recombinat (Shingrix) 04/01/2016, 08/14/2016       Objective:   Physical Exam BP 102/70 (BP Location: Right Arm, Patient Position: Sitting, Cuff Size: Large)   Pulse 95   Temp 97.9 F (36.6 C) (Oral)   Ht '5\' 4"'$  (1.626 m)   Wt 192 lb (87.1 kg)   SpO2 96%   BMI 32.96 kg/m   SpO2: 96 % O2 Device: None (Room air)  GENERAL: Overweight woman, spry, age-appropriate, fully ambulatory, mild conversational dyspnea. HEAD: Normocephalic, atraumatic.  EYES: Pupils equal, round, reactive to light.  No scleral icterus.  MOUTH: Oral mucosa moist, no thrush. NECK: Supple. No thyromegaly. Trachea midline. No JVD.  No adenopathy.  Faint bilateral carotid bruits. PULMONARY: Mild tachypnea, good air entry bilaterally.  Faint end expiratory wheezes noted.Marland Kitchen CARDIOVASCULAR: S1 and S2. Regular rate and rhythm.  Has a grade 2/6 systolic ejection murmur in the mitral position. ABDOMEN: Slightly protuberant otherwise benign MUSCULOSKELETAL: No joint deformity, no clubbing, no edema.  NEUROLOGIC: No overt focal deficit, no gait disturbance, speech is fluent. SKIN: Intact,warm,dry. PSYCH: Mood and behavior normal.      Assessment & Plan:     ICD-10-CM   1. Complex sleep apnea syndrome   G47.31 AMB REFERRAL FOR DME   Will be set up with AVAPS Recommend visit with sleep provider    2. Dyspnea, unspecified type  R06.00    Does not find relief with Breo Trial off of Breo Continue as needed albuterol for now Dyspnea out of proportion to pulmonary function     Orders Placed This Encounter  Procedures   AMB REFERRAL FOR DME    Referral Priority:   Routine    Referral Type:   Durable Medical Equipment Purchase    Number of Visits Requested:   1   Patient will be set up with AVAPS through [facility].  She will be set up to see Dr. Chesley Mires our sleep specialist.  Will see her in follow-up in 3 months time otherwise, she is to contact us  prior to that time should any new difficulties arise.  Renold Don, MD Advanced Bronchoscopy PCCM [facility]    *This note was dictated using voice recognition software/Dragon.  Despite best efforts to proofread, errors can occur which can change the meaning. Any transcriptional errors that result from this process are unintentional and may not be fully corrected at the time of dictation.

## 2021-09-28 NOTE — Patient Instructions (Signed)
We are going to set you up with a machine call AVAPS through a company name [employer].  They will set you up with the device.  We will see you in follow-up in 6 to 8 weeks time with one of our sleep providers.  Dr. Halford Chessman is our main sleep provider and he can fine-tune any issues that you may have with the AVAPS.   I will see you in follow-up in 3 months time otherwise.

## 2021-11-13 ENCOUNTER — Encounter (INDEPENDENT_AMBULATORY_CARE_PROVIDER_SITE_OTHER): Payer: Self-pay

## 2021-11-13 DIAGNOSIS — L538 Other specified erythematous conditions: Secondary | ICD-10-CM | POA: Diagnosis not present

## 2021-11-13 DIAGNOSIS — Z85828 Personal history of other malignant neoplasm of skin: Secondary | ICD-10-CM | POA: Diagnosis not present

## 2021-11-13 DIAGNOSIS — D2361 Other benign neoplasm of skin of right upper limb, including shoulder: Secondary | ICD-10-CM | POA: Diagnosis not present

## 2021-11-13 DIAGNOSIS — D2271 Melanocytic nevi of right lower limb, including hip: Secondary | ICD-10-CM | POA: Diagnosis not present

## 2021-11-13 DIAGNOSIS — L298 Other pruritus: Secondary | ICD-10-CM | POA: Diagnosis not present

## 2021-11-13 DIAGNOSIS — Z85820 Personal history of malignant melanoma of skin: Secondary | ICD-10-CM | POA: Diagnosis not present

## 2021-11-13 DIAGNOSIS — L820 Inflamed seborrheic keratosis: Secondary | ICD-10-CM | POA: Diagnosis not present

## 2021-11-13 DIAGNOSIS — D485 Neoplasm of uncertain behavior of skin: Secondary | ICD-10-CM | POA: Diagnosis not present

## 2021-11-13 DIAGNOSIS — B078 Other viral warts: Secondary | ICD-10-CM | POA: Diagnosis not present

## 2021-11-13 DIAGNOSIS — D2261 Melanocytic nevi of right upper limb, including shoulder: Secondary | ICD-10-CM | POA: Diagnosis not present

## 2021-11-13 DIAGNOSIS — R208 Other disturbances of skin sensation: Secondary | ICD-10-CM | POA: Diagnosis not present

## 2021-11-15 ENCOUNTER — Other Ambulatory Visit: Payer: Self-pay | Admitting: Family Medicine

## 2021-11-15 DIAGNOSIS — F4322 Adjustment disorder with anxiety: Secondary | ICD-10-CM

## 2021-11-15 DIAGNOSIS — M1A9XX0 Chronic gout, unspecified, without tophus (tophi): Secondary | ICD-10-CM

## 2021-11-15 DIAGNOSIS — N3281 Overactive bladder: Secondary | ICD-10-CM

## 2021-11-15 DIAGNOSIS — I10 Essential (primary) hypertension: Secondary | ICD-10-CM

## 2021-11-16 NOTE — Telephone Encounter (Signed)
Refilled 09/25/2021 #[id number] rf. Requested Prescriptions  Pending Prescriptions Disp Refills   lisinopril (ZESTRIL) 5 MG tablet [Pharmacy Med Name: LISINOPRIL 5 MG TABLET] 90 tablet 0    Sig: TAKE 1 TABLET BY MOUTH EVERY DAY     Cardiovascular:  ACE Inhibitors Failed - 11/15/2021  4:24 PM      Failed - Cr in normal range and within 180 days    Creatinine, Ser  Date Value Ref Range Status  05/08/2021 0.87 0.44 - 1.00 mg/dL Final         Failed - K in normal range and within 180 days    Potassium  Date Value Ref Range Status  05/08/2021 3.9 3.5 - 5.1 mmol/L Final         Passed - Patient is not pregnant      Passed - Last BP in normal range    BP Readings from Last 1 Encounters:  09/28/21 102/70         Passed - Valid encounter within last 6 months    Recent Outpatient Visits           1 month ago Hypertension associated with diabetes [facility])   [place], Jake Church, DO   6 months ago Community acquired pneumonia of left lung, unspecified part of lung   [facility] [place], Dionne Bucy, MD   7 months ago Encounter for annual physical exam   [facility] [place], Dionne Bucy, MD   1 year ago Chronic diarrhea of unknown origin   [place], Kirstie Peri, MD   1 year ago Encounter for annual physical exam   [facility], Dionne Bucy, MD

## 2021-11-16 NOTE — Telephone Encounter (Signed)
Requested medication (s) are due for refill today: no  Requested medication (s) are on the active medication list: yes  Last refill:  03/23/21 and 07/05/21  Future visit scheduled:yes  Notes to clinic:  Unable to refill per protocol, Rx request is too soon for oxybutynin and unable to delegate ambilify, routing for review.     Requested Prescriptions  Pending Prescriptions Disp Refills   oxybutynin (DITROPAN-XL) 10 MG 24 hr tablet [Pharmacy Med Name: OXYBUTYNIN CL ER 10 MG TABLET] 90 tablet 1    Sig: TAKE 1 TABLET BY MOUTH EVERY DAY     Urology:  Bladder Agents Passed - 11/15/2021  4:24 PM      Passed - Valid encounter within last 12 months    Recent Outpatient Visits           1 month ago Hypertension associated with diabetes [facility])   [facility] [place], Jake Church, DO   6 months ago Community acquired pneumonia of left lung, unspecified part of lung   [facility] [place], Dionne Bucy, MD   7 months ago Encounter for annual physical exam   [facility] [place], Dionne Bucy, MD   1 year ago Chronic diarrhea of unknown origin   [place], Kirstie Peri, MD   1 year ago Encounter for annual physical exam   [facility] [place], Dionne Bucy, MD               ARIPiprazole (ABILIFY) 2 MG tablet [Pharmacy Med Name: ARIPIPRAZOLE 2 MG TABLET] 90 tablet 1    Sig: TAKE 1 TABLET BY MOUTH EVERY DAY     Not Delegated - Psychiatry:  Antipsychotics - Second Generation (Atypical) - aripiprazole Failed - 11/15/2021  4:24 PM      Failed - This refill cannot be delegated      Failed - Lipid Panel in normal range within the last 12 months    Cholesterol, Total  Date Value Ref Range Status  03/28/2021 257 (H) 100 - 199 mg/dL Final   Cholesterol  Date Value Ref Range Status  05/04/2021 189 0 - 200 mg/dL Final   LDL Chol Calc (NIH)  Date Value Ref Range Status  03/28/2021 132 (H) 0 - 99 mg/dL Final   LDL  Cholesterol  Date Value Ref Range Status  05/04/2021 104 (H) 0 - 99 mg/dL Final    Comment:           Total Cholesterol/HDL:CHD Risk Coronary Heart Disease Risk Table                     Men   Women  1/2 Average Risk   3.4   3.3  Average Risk       5.0   4.4  2 X Average Risk   9.6   7.1  3 X Average Risk  23.4   11.0        Use the calculated Patient Ratio above and the CHD Risk Table to determine the patient's CHD Risk.        ATP III CLASSIFICATION (LDL):  <100     mg/dL   Optimal  100-129  mg/dL   Near or Above                    Optimal  130-159  mg/dL   Borderline  160-189  mg/dL   High  >190     mg/dL   Very High Performed at  [place], [place] [address]    HDL  Date Value Ref Range Status  05/04/2021 40 (L) >40 mg/dL Final  03/28/2021 41 >39 mg/dL Final   Triglycerides  Date Value Ref Range Status  05/04/2021 224 (H) <150 mg/dL Final         Passed - TSH in normal range and within 360 days    TSH  Date Value Ref Range Status  03/28/2021 4.050 0.450 - 4.500 uIU/mL Final         Passed - Completed PHQ-2 or PHQ-9 in the last 360 days      Passed - Last BP in normal range    BP Readings from Last 1 Encounters:  09/28/21 102/70         Passed - Last Heart Rate in normal range    Pulse Readings from Last 1 Encounters:  09/28/21 95         Passed - Valid encounter within last 6 months    Recent Outpatient Visits           1 month ago Hypertension associated with diabetes [facility])   [facility] [place], Jake Church, DO   6 months ago Community acquired pneumonia of left lung, unspecified part of lung   [facility] [place], Dionne Bucy, MD   7 months ago Encounter for annual physical exam   [facility] [place], Dionne Bucy, MD   1 year ago Chronic diarrhea of unknown origin   [place], Kirstie Peri, MD   1 year ago Encounter for annual physical exam    [facility] [place], Dionne Bucy, MD              Passed - CBC within normal limits and completed in the last 12 months    WBC  Date Value Ref Range Status  05/08/2021 9.9 4.0 - 10.5 K/uL Final   RBC  Date Value Ref Range Status  05/08/2021 4.12 3.87 - 5.11 MIL/uL Final   Hemoglobin  Date Value Ref Range Status  05/08/2021 11.1 (L) 12.0 - 15.0 g/dL Final  09/17/2018 10.4 (L) 11.1 - 15.9 g/dL Final   HCT  Date Value Ref Range Status  05/08/2021 35.8 (L) 36.0 - 46.0 % Final   Hematocrit  Date Value Ref Range Status  09/17/2018 33.0 (L) 34.0 - 46.6 % Final   MCHC  Date Value Ref Range Status  05/08/2021 31.0 30.0 - 36.0 g/dL Final   [facility]  Date Value Ref Range Status  05/08/2021 26.9 26.0 - 34.0 pg Final   MCV  Date Value Ref Range Status  05/08/2021 86.9 80.0 - 100.0 fL Final  09/17/2018 78 (L) 79 - 97 fL Final   No results found for: "PLTCOUNTKUC", "LABPLAT", "POCPLA" RDW  Date Value Ref Range Status  05/08/2021 15.9 (H) 11.5 - 15.5 % Final  09/17/2018 16.5 (H) 11.7 - 15.4 % Final         Passed - CMP within normal limits and completed in the last 12 months    Albumin  Date Value Ref Range Status  03/28/2021 5.0 (H) 3.6 - 4.6 g/dL Final   Alkaline Phosphatase  Date Value Ref Range Status  03/28/2021 105 44 - 121 IU/L Final   ALT  Date Value Ref Range Status  03/28/2021 33 (H) 0 - 32 IU/L Final   AST  Date Value Ref Range Status  03/28/2021 29 0 - 40 IU/L Final  BUN  Date Value Ref Range Status  05/08/2021 17 8 - 23 mg/dL Final  03/28/2021 17 8 - 27 mg/dL Final   Calcium  Date Value Ref Range Status  05/08/2021 8.4 (L) 8.9 - 10.3 mg/dL Final   CO2  Date Value Ref Range Status  05/08/2021 29 22 - 32 mmol/L Final   Creatinine, Ser  Date Value Ref Range Status  05/08/2021 0.87 0.44 - 1.00 mg/dL Final   Glucose, Bld  Date Value Ref Range Status  05/08/2021 104 (H) 70 - 99 mg/dL Final    Comment:    Glucose reference range  applies only to samples taken after fasting for at least 8 hours.   Glucose-Capillary  Date Value Ref Range Status  05/08/2021 99 70 - 99 mg/dL Final    Comment:    Glucose reference range applies only to samples taken after fasting for at least 8 hours.   Potassium  Date Value Ref Range Status  05/08/2021 3.9 3.5 - 5.1 mmol/L Final   Sodium  Date Value Ref Range Status  05/08/2021 141 135 - 145 mmol/L Final  03/28/2021 140 134 - 144 mmol/L Final   Bilirubin Total  Date Value Ref Range Status  03/28/2021 0.4 0.0 - 1.2 mg/dL Final   Protein, ur  Date Value Ref Range Status  10/07/2018 NEGATIVE NEGATIVE mg/dL Final   Total Protein  Date Value Ref Range Status  03/28/2021 7.5 6.0 - 8.5 g/dL Final   GFR calc Af Amer  Date Value Ref Range Status  08/05/2019 55 (L) >59 mL/min/1.73 Final    Comment:    **[facility] currently reports eGFR in compliance with the current**   recommendations of the [employer]. [facility] will   update reporting as new guidelines are published from the NKF-ASN   Task force.    eGFR  Date Value Ref Range Status  03/28/2021 55 (L) >59 mL/min/1.73 Final   GFR, Estimated  Date Value Ref Range Status  05/08/2021 >60 >60 mL/min Final    Comment:    (NOTE) Calculated using the CKD-EPI Creatinine Equation (2021)           allopurinol (ZYLOPRIM) 100 MG tablet [Pharmacy Med Name: ALLOPURINOL 100 MG TABLET] 90 tablet 1    Sig: TAKE 1 [place] DAY     Endocrinology:  Gout Agents - allopurinol Passed - 11/15/2021  4:24 PM      Passed - Uric Acid in normal range and within 360 days    Uric Acid  Date Value Ref Range Status  03/28/2021 7.0 3.1 - 7.9 mg/dL Final    Comment:               Therapeutic target for gout patients: <6.0         Passed - Cr in normal range and within 360 days    Creatinine, Ser  Date Value Ref Range Status  05/08/2021 0.87 0.44 - 1.00 mg/dL Final         Passed - Valid encounter within last  12 months    Recent Outpatient Visits           1 month ago Hypertension associated with diabetes [facility])   [place], Jake Church, DO   6 months ago Community acquired pneumonia of left lung, unspecified part of lung   [employer], Dionne Bucy, MD   7 months ago Encounter for annual physical exam   [facility] [place],  MD   1 year ago Chronic diarrhea of unknown origin   [facility], Kirstie Peri, MD   1 year ago Encounter for annual physical exam   [facility] [place], Dionne Bucy, MD              Passed - CBC within normal limits and completed in the last 12 months    WBC  Date Value Ref Range Status  05/08/2021 9.9 4.0 - 10.5 K/uL Final   RBC  Date Value Ref Range Status  05/08/2021 4.12 3.87 - 5.11 MIL/uL Final   Hemoglobin  Date Value Ref Range Status  05/08/2021 11.1 (L) 12.0 - 15.0 g/dL Final  09/17/2018 10.4 (L) 11.1 - 15.9 g/dL Final   HCT  Date Value Ref Range Status  05/08/2021 35.8 (L) 36.0 - 46.0 % Final   Hematocrit  Date Value Ref Range Status  09/17/2018 33.0 (L) 34.0 - 46.6 % Final   MCHC  Date Value Ref Range Status  05/08/2021 31.0 30.0 - 36.0 g/dL Final   [facility]  Date Value Ref Range Status  05/08/2021 26.9 26.0 - 34.0 pg Final   MCV  Date Value Ref Range Status  05/08/2021 86.9 80.0 - 100.0 fL Final  09/17/2018 78 (L) 79 - 97 fL Final   No results found for: "PLTCOUNTKUC", "LABPLAT", "POCPLA" RDW  Date Value Ref Range Status  05/08/2021 15.9 (H) 11.5 - 15.5 % Final  09/17/2018 16.5 (H) 11.7 - 15.4 % Final         Signed Prescriptions Disp Refills   Venlafaxine HCl 225 MG TB24 90 tablet 0    Sig: TAKE 1 TABLET BY MOUTH EVERY DAY     Psychiatry: Antidepressants - SNRI - desvenlafaxine & venlafaxine Failed - 11/15/2021  4:24 PM      Failed - Lipid Panel in normal range within the last 12 months    Cholesterol, Total  Date Value  Ref Range Status  03/28/2021 257 (H) 100 - 199 mg/dL Final   Cholesterol  Date Value Ref Range Status  05/04/2021 189 0 - 200 mg/dL Final   LDL Chol Calc (NIH)  Date Value Ref Range Status  03/28/2021 132 (H) 0 - 99 mg/dL Final   LDL Cholesterol  Date Value Ref Range Status  05/04/2021 104 (H) 0 - 99 mg/dL Final    Comment:           Total Cholesterol/HDL:CHD Risk Coronary Heart Disease Risk Table                     Men   Women  1/2 Average Risk   3.4   3.3  Average Risk       5.0   4.4  2 X Average Risk   9.6   7.1  3 X Average Risk  23.4   11.0        Use the calculated Patient Ratio above and the CHD Risk Table to determine the patient's CHD Risk.        ATP III CLASSIFICATION (LDL):  <100     mg/dL   Optimal  100-129  mg/dL   Near or Above                    Optimal  130-159  mg/dL   Borderline  160-189  mg/dL   High  >190     mg/dL   Very High Performed at [facility], [address] [place], [place] [address]    HDL  Date Value Ref Range Status  05/04/2021 40 (L) >40 mg/dL Final  03/28/2021 41 >39 mg/dL Final   Triglycerides  Date Value Ref Range Status  05/04/2021 224 (H) <150 mg/dL Final         Passed - Cr in normal range and within 360 days    Creatinine, Ser  Date Value Ref Range Status  05/08/2021 0.87 0.44 - 1.00 mg/dL Final         Passed - Completed PHQ-2 or PHQ-9 in the last 360 days      Passed - Last BP in normal range    BP Readings from Last 1 Encounters:  09/28/21 102/70         Passed - Valid encounter within last 6 months    Recent Outpatient Visits           1 month ago Hypertension associated with diabetes [facility])   [facility] [place], Jake Church, DO   6 months ago Community acquired pneumonia of left lung, unspecified part of lung   [facility], Dionne Bucy, MD   7 months ago Encounter for annual physical exam   [facility], Dionne Bucy, MD   1 year  ago Chronic diarrhea of unknown origin   [place], Kirstie Peri, MD   1 year ago Encounter for annual physical exam   [facility] [place], Dionne Bucy, MD               famotidine (PEPCID) 20 MG tablet 180 tablet 0    Sig: TAKE 1 TABLET BY MOUTH TWICE A DAY     Gastroenterology:  H2 Antagonists Passed - 11/15/2021  4:24 PM      Passed - Valid encounter within last 12 months    Recent Outpatient Visits           1 month ago Hypertension associated with diabetes [facility])   [place], Jake Church, DO   6 months ago Community acquired pneumonia of left lung, unspecified part of lung   [facility] [place], Dionne Bucy, MD   7 months ago Encounter for annual physical exam   [facility] [place], Dionne Bucy, MD   1 year ago Chronic diarrhea of unknown origin   [place], Kirstie Peri, MD   1 year ago Encounter for annual physical exam   [facility], Dionne Bucy, MD

## 2021-11-20 ENCOUNTER — Ambulatory Visit: Payer: Self-pay | Admitting: *Deleted

## 2021-11-20 NOTE — Telephone Encounter (Signed)
Likely another viral illness. There are a lot of things going around.  If she is feelign better, that is good news

## 2021-11-20 NOTE — Telephone Encounter (Signed)
Summary: weakness   Patient called in says had headache and was vomiting on Friday, but that has stopped,just feeling weak now. She wants to know if this is common., but she has tested negative for covid she says.       Chief Complaint: weakness  Symptoms:  feels weak and reports getting over sickness last week. Last Monday night started with diarrhea headache, on Friday vomiting. Stopped over weekend and no V/D now. Just feels week. Denies dizziness, able to walk . Tolerating fluids and eating better. Shortness of breath at times and has been seen for this x 1 month ago. Wanted to make PCP aware of how she is feeling Frequency: 1 week ago  Pertinent Negatives: Patient denies chest pain no difficulty breathing, no fever, no dizziness or lightheadedness. No headaches.  Disposition: '[]'$ ED /'[]'$ Urgent Care (no appt availability in office) / '[]'$ Appointment(In office/virtual)/ '[]'$  [place] [facility]/ '[]'$ Home Care/ '[]'$ Refused Recommended Disposition /'[]'$ [place] [facility]/ '[x]'$  Follow-up with PCP Additional Notes:   Patient reports she does not want appt or feel like coming in to office. Just wanted to make PCP aware of sickness last week. Offered My Chart VV and patient declined. Reports if PCP wants appt she will agree. Please advise. Only 1 appt available this afternoon. Recommended if sx worsen go to [facility] / ED.         Reason for Disposition  [1] MODERATE weakness (i.e., interferes with work, school, normal activities) AND [2] persists > 3 days  Answer Assessment - Initial Assessment Questions 1. DESCRIPTION: "Describe how you are feeling."     Weakness  2. SEVERITY: "How bad is it?"  "Can you stand and walk?"   - MILD (0-3): Feels weak or tired, but does not interfere with work, school or normal activities.   - MODERATE (4-7): Able to stand and walk; weakness interferes with work, school, or normal activities.   - SEVERE (8-10): Unable to stand or walk; unable to do usual activities.      Moderate can do some normal activities.  3. ONSET: "When did these symptoms begin?" (e.g., hours, days, weeks, months)     Moderate  4. CAUSE: "What do you think is causing the weakness or fatigue?" (e.g., not drinking enough fluids, medical problem, trouble sleeping)     Sickness last week with diarrhea, and vomiting Friday . Now feels better.  5. NEW MEDICINES:  "Have you started on any new medicines recently?" (e.g., opioid pain medicines, benzodiazepines, muscle relaxants, antidepressants, antihistamines, neuroleptics, beta blockers)     no 6. OTHER SYMPTOMS: "Do you have any other symptoms?" (e.g., chest pain, fever, cough, SOB, vomiting, diarrhea, bleeding, other areas of pain)     Weakness , shortness of breath x 1 month. Has been seen by cardiology . 7. PREGNANCY: "Is there any chance you are pregnant?" "When was your last menstrual period?"     na  Protocols used: Weakness (Generalized) and Fatigue-A-AH

## 2021-11-20 NOTE — Telephone Encounter (Signed)
Please Review and advise. Thanks

## 2021-11-20 NOTE — Telephone Encounter (Signed)
Noted. Spoke with patient, she is feeling better and does not wish to have an appt.

## 2021-11-21 DIAGNOSIS — H35372 Puckering of macula, left eye: Secondary | ICD-10-CM | POA: Diagnosis not present

## 2021-11-21 DIAGNOSIS — H524 Presbyopia: Secondary | ICD-10-CM | POA: Diagnosis not present

## 2021-11-21 LAB — HM DIABETES EYE EXAM

## 2021-12-29 ENCOUNTER — Ambulatory Visit: Payer: Medicare PPO | Admitting: Pulmonary Disease

## 2021-12-29 ENCOUNTER — Encounter: Payer: Self-pay | Admitting: Pulmonary Disease

## 2021-12-29 VITALS — BP 120/80 | HR 83 | Temp 97.0°F | Ht 64.0 in | Wt 191.4 lb

## 2021-12-29 DIAGNOSIS — R0600 Dyspnea, unspecified: Secondary | ICD-10-CM | POA: Diagnosis not present

## 2021-12-29 DIAGNOSIS — G4731 Primary central sleep apnea: Secondary | ICD-10-CM | POA: Diagnosis not present

## 2021-12-29 DIAGNOSIS — J4520 Mild intermittent asthma, uncomplicated: Secondary | ICD-10-CM

## 2021-12-29 DIAGNOSIS — K219 Gastro-esophageal reflux disease without esophagitis: Secondary | ICD-10-CM

## 2021-12-29 LAB — NITRIC OXIDE: Nitric Oxide: 24

## 2021-12-29 MED ORDER — TRELEGY ELLIPTA 100-62.5-25 MCG/ACT IN AEPB: 1.0000 | INHALATION_SPRAY | Freq: Every day | RESPIRATORY_TRACT | 0 refills | Status: DC

## 2021-12-29 NOTE — Patient Instructions (Signed)
Recommend restarting exercise slowly.  Walking on the indoor track is perfect particularly at this time of the year.  I recommend weight loss.  We have switched your Breo Ellipta to Trelegy Ellipta this is 1 puff daily sure you rinse your mouth well after you use it.  For your AVAPS machine, I recommend that you use the chinstrap provided for you to see if this helps with tolerating the mask better.  If you still have difficulty, let us know so we can refer you to the [place] lab for assessment of your mask issues.  We will see you in follow-up in 3 months time call sooner should any problems arise.

## 2021-12-29 NOTE — Progress Notes (Signed)
Subjective:    Patient ID: Natasha Vasquez, female    DOB: [birth year], 81 y.o.   MRN: [id number] Patient Care Team: Virginia Crews, MD as PCP - General (Family Medicine) Watt Climes, PA as Physician Assistant (Physician Assistant) Marry Guan, Laurice Record, MD as Consulting Physician (Orthopedic Surgery) Byrnett, Forest Gleason, MD (General Surgery) Dasher, Rayvon Char, MD (Dermatology) Cammie Sickle, MD as Consulting Physician (Internal Medicine) Leandrew Koyanagi, MD as Referring Physician (Ophthalmology) Tyler Pita, MD as Consulting Physician (Pulmonary Disease) Melrose Nakayama, MD as Consulting Physician (Cardiothoracic Surgery)  Chief Complaint  Patient presents with   Follow-up    DOE and wheezing. Some cough occasional.      HPI She is an 81 year old very remote former smoker with minimal tobacco history, who presents for follow-up on the issue of dyspnea on exertion.  She has been noticed with complex sleep apnea and is currently on AVAPS which she received a few weeks ago.  She has had difficulties tolerating the device because of the type of mask.  She was provided with a chinstrap but has not used it and notes that she is a "mouth breather".  The 1 night where she was able to keep her mouth closed and use the device all night she noted that she felt great the following day.  She has however not been able to use it reliably.  As noted she has not tried chinstrap we encouraged her to do this as it may help her keep the mask on appropriate position all night.  Previously we had given her a trial of Breo Ellipta which she did not feel helped however lately she has had some increased upper respiratory congestion and "wheezing" and started using the Breo again 3 days ago and feels that it helps her.  She however feels it could be "stronger".  Today during the visit she is noted to have some upper airway rhonchorous sounds.  Has not had any fevers, chills or sweats.  No  cough.  No hemoptysis.  No chest pain, no paroxysmal nocturnal dyspnea.  No edema or calf tenderness.  Not endorse any other symptomatology today.   DATA: 04/03/2021 echocardiogram: LVEF 60 to 65%, no regional wall motion abnormalities.  Moderate LVH. Grade 1 DD.  RV function normal. 04/20/2021 PFTs: FEV1 1.95 L or 99% predicted, FVC 2.63 L or 102% predicted, FEV1/FVC 74%, no bronchodilator response.  Lung volumes normal with minimal air trapping.  Diffusion capacity mildly reduced.  Overall normal study. 05/04/2021 CT angio chest: Left upper lobe and lingular pneumonia, no pulmonary embolism 06/01/2021 CT coronary: Coronary score 0, no overt coronary findings on lung windows, small hiatal hernia. 08/18/2021 split-night sleep study: Consistent with severe obstructive sleep apnea and treatment emergent central sleep apnea, duration was not successful.  Eventually patient titrated to AVAPS  Review of Systems A 10 point review of systems was performed and it is as noted above otherwise negative.  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 ([place]) 05/10/2021   CAP (community acquired pneumonia) 05/04/2021   HLD (hyperlipidemia) 05/04/2021   Elevated troponin 05/04/2021   Chronic diastolic CHF (congestive heart failure) ([place]) 05/04/2021   Recurrent major depressive disorder, in partial remission ([place]) 03/24/2021   Gout 03/24/2021   PAD (peripheral artery disease) ([place]) 06/24/2020   Iron deficiency 10/07/2018   Primary osteoarthritis of left  knee 09/17/2018   Emphysema lung ([place]) 03/14/2018   Diabetic peripheral neuropathy associated with type 2 diabetes mellitus ([place]) 03/14/2018   Metatarsalgia of both feet 03/14/2018   Abnormality of lung 03/14/2018   Acquired absence of right breast 06/11/2017   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[place])  05/09/2017   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   Dysphagia 03/11/2017   Obesity 03/11/2017   Subclinical hypothyroidism 06/08/2015   Osteopenia 06/08/2015   Billowing mitral valve 06/08/2015   Melanoma in situ ([place]) 06/08/2015   Cramps of lower extremity 06/08/2015   Type 2 diabetes mellitus ([place]) 05/18/2015   Chronic diarrhea 05/18/2015   Anemia 05/18/2015   Clinical depression 01/26/2015   Hyperlipidemia associated with type 2 diabetes mellitus ([place]) 10/28/2014   Hypertension associated with diabetes ([place]) 09/27/2014   GERD (gastroesophageal reflux disease) 08/06/2014   Overactive bladder 07/16/2014   Social History   Tobacco Use   Smoking status: Former    Years: 2.00    Types: Cigarettes    Quit date: 1965    Years since quitting: 58.9   Smokeless tobacco: Never   Tobacco comments:    smoked in college--1 pack would last one week.  Substance Use Topics   Alcohol use: Yes    Alcohol/week: 7.0 - 14.0 standard drinks of alcohol    Types: 7 - 14 Glasses of wine per week    Comment: 1-2 glasses of wine per night   Allergies  Allergen Reactions   Codeine Other (See Comments)    Chest pain   Lorazepam Other (See Comments)    "Feels loopy"   Oxycodone Other (See Comments)    MENTAL CHANGES   Paregoric     Chest pain---tolerated MORPHINE   Penicillins Hives    Has patient had a PCN reaction causing immediate rash, facial/tongue/throat swelling, SOB or lightheadedness with hypotension: Yes Has patient had a PCN reaction causing severe rash involving mucus membranes or skin necrosis: No Has patient had a PCN reaction that required hospitalization: NO Has patient had a PCN reaction occurring within the last 10 years: No If all of the above answers are "NO", then may proceed with Cephalosporin use.  Can take Augmentin   Quinolones     PATIENT UNAWARE OF allergy to this class of antibiotics, only aware of allergy to [facility] Patient was warned about  not using Cipro and similar antibiotics. Recent studies have raised concern that fluoroquinolone antibiotics could be associated with an increased risk of aortic aneurysm Fluoroquinolones have non-antimicrobial properties that might jeopardise the integrity of the extracellular matrix of the vascular wall In a  propensity score matched cohort study in [place], there was a 66% increased rate   Aleve [Naproxen Sodium] Hives, Rash and Other (See Comments)    headaches   Current Meds  Medication Sig   acetaminophen (TYLENOL) 500 MG tablet Take 500 mg by mouth every 6 (six) hours as needed.   albuterol (VENTOLIN HFA) 108 (90 Base) MCG/ACT inhaler TAKE 2 PUFFS BY MOUTH EVERY 6 HOURS AS NEEDED FOR WHEEZE OR SHORTNESS OF BREATH   allopurinol (ZYLOPRIM) 100 MG tablet TAKE 1 TABLET BY MOUTH EVERY DAY   alum & mag hydroxide-simeth (MAALOX/MYLANTA) 200-200-20 MG/5ML suspension Take 30 mLs by mouth every 4 (four) hours as needed for indigestion or heartburn.   ARIPiprazole (ABILIFY) 2 MG tablet TAKE 1 TABLET BY MOUTH EVERY DAY   aspirin 81 MG tablet Take 81 mg by mouth daily.  bismuth subsalicylate (PEPTO BISMOL) 262 MG chewable tablet Chew 262 mg by mouth as needed. **capsule**   calcium carbonate (TUMS - DOSED IN MG ELEMENTAL CALCIUM) 500 MG chewable tablet Chew 1 tablet by mouth as needed for indigestion or heartburn.   clobetasol ointment (TEMOVATE) 0.05 % APPLY TO AFFECTED AREAS AT NIGHT AS NEEDED   clotrimazole (LOTRIMIN) 1 % cream Apply 1 application topically 2 (two) times daily.   colestipol (COLESTID) 1 g tablet Take 1 g by mouth 2 (two) times daily.   famotidine (PEPCID) 20 MG tablet TAKE 1 TABLET BY MOUTH TWICE A DAY   ferrous sulfate 324 MG TBEC Take 324 mg by mouth daily with breakfast.   fluocinonide (LIDEX) 0.05 % external solution APPLY TO AFFECTED AREAS OF SCALP DAILY UNTIL CLEAR, THEN FOR FLARES   fluticasone furoate-vilanterol (BREO ELLIPTA) 100-25 MCG/ACT AEPB Inhale 1 puff into the  lungs daily.   Fluticasone-Umeclidin-Vilant (TRELEGY ELLIPTA) 100-62.5-25 MCG/ACT AEPB Inhale 1 puff into the lungs daily.   furosemide (LASIX) 20 MG tablet Take 1 tablet (20 mg total) by mouth daily.   lisinopril (ZESTRIL) 5 MG tablet TAKE 1 TABLET BY MOUTH EVERY DAY   Multiple Vitamins-Minerals (CENTRUM SILVER PO) Take 1 tablet by mouth daily.   omeprazole (PRILOSEC) 40 MG capsule Take 1 capsule (40 mg total) by mouth 2 (two) times daily.   oxybutynin (DITROPAN-XL) 10 MG 24 hr tablet TAKE 1 TABLET BY MOUTH EVERY DAY   potassium chloride (KLOR-CON) 10 MEQ tablet Take 1 tablet (10 mEq total) by mouth daily.   rosuvastatin (CRESTOR) 10 MG tablet TAKE 1 TABLET BY MOUTH EVERY DAY   Venlafaxine HCl 225 MG TB24 TAKE 1 TABLET BY MOUTH EVERY DAY   Immunization History  Administered Date(s) Administered   Fluad Quad(high Dose 65+) 09/09/2018, 10/12/2019, 10/14/2020, 09/25/2021   Influenza Split 10/24/2011   Influenza, High Dose Seasonal PF 11/18/2013, 01/24/2016, 10/08/2016, 09/12/2017   Influenza-Unspecified 10/24/2011, 10/14/2012, 11/18/2013   Moderna Sars-Covid-2 Vaccination 01/30/2019, 03/02/2019   Pneumococcal Conjugate-13 08/21/2013   Pneumococcal Polysaccharide-23 01/24/2016   Respiratory Syncytial Virus Vaccine,Recomb Aduvanted(Arexvy) 11/11/2021   Tdap 10/24/2011   Zoster Recombinat (Shingrix) 04/01/2016, 08/14/2016       Objective:   Physical Exam BP 120/80 (BP Location: Left Arm, Cuff Size: Normal)   Pulse 83   Temp (!) 97 F (36.1 C)   Ht '5\' 4"'$  (1.626 m)   Wt 191 lb 6.4 oz (86.8 kg)   SpO2 95%   BMI 32.85 kg/m   SpO2: 95 % O2 Device: None (Room air)  GENERAL: Overweight woman, spry, age-appropriate, fully ambulatory, mild conversational dyspnea. HEAD: Normocephalic, atraumatic.  EYES: Pupils equal, round, reactive to light.  No scleral icterus.  MOUTH: Oral mucosa moist, no thrush. NECK: Supple. No thyromegaly. Trachea midline. No JVD.  No adenopathy.  Faint bilateral  carotid bruits. PULMONARY: Good air entry bilaterally.  Faint end expiratory wheezes noted.Marland Kitchen CARDIOVASCULAR: S1 and S2. Regular rate and rhythm.  Has a grade 1/6 systolic ejection murmur in the mitral position. ABDOMEN: Slightly protuberant otherwise benign MUSCULOSKELETAL: No joint deformity, no clubbing, no edema.  NEUROLOGIC: No overt focal deficit, no gait disturbance, speech is fluent. SKIN: Intact,warm,dry. PSYCH: Mood and behavior normal.    Lab Results  Component Value Date   NITRICOXIDE 24 12/29/2021   Ambulatory oximetry was performed today: At rest her heart rate was 78 bpm, resting O2 sats 91%, O2 sats INCREASED at maximum exercise to 94% and remained there.  Heart rate maximum 120 bpm.  Assessment & Plan:     ICD-10-CM   1. Dyspnea, unspecified type  R06.00 Nitric oxide   Suspect deconditioning a major component Recommend start activity slowly and increase as tolerated Patient has access to indoor walking track    2. Mild intermittent asthmatic bronchitis without complication  A19.37    Notes some improvement on Breo Ellipta Trial of Trelegy to see if can optimize    3. Complex sleep apnea syndrome  G47.31    Having difficulties adapting to AVAPS Recommended use of chinstrap If not able to add that we will have to refer to [place] lab    4. Gastroesophageal reflux disease, unspecified whether esophagitis present  K21.9    Patient on omeprazole and Pepcid Symptoms controlled     Orders Placed This Encounter  Procedures   Nitric oxide   Meds ordered this encounter  Medications   Fluticasone-Umeclidin-Vilant (TRELEGY ELLIPTA) 100-62.5-25 MCG/ACT AEPB    Sig: Inhale 1 puff into the lungs daily.    Dispense:  28 each    Refill:  0    Order Specific Question:   Lot Number?    Answer:   [id number]    Order Specific Question:   Expiration Date?    Answer:   05/16/2023    Order Specific Question:   Quantity    Answer:   2   We will see the patient in  follow-up in 3 months time she is to call sooner should any new problems arise.  She is to let us know if she continues to have difficulties adapting to her AVAPS machine.   Renold Don, MD Advanced Bronchoscopy PCCM [facility]    *This note was dictated using voice recognition software/Dragon.  Despite best efforts to proofread, errors can occur which can change the meaning. Any transcriptional errors that result from this process are unintentional and may not be fully corrected at the time of dictation.

## 2022-01-24 ENCOUNTER — Other Ambulatory Visit: Payer: Self-pay | Admitting: Thoracic Surgery (Cardiothoracic Vascular Surgery)

## 2022-01-24 DIAGNOSIS — I7121 Aneurysm of the ascending aorta, without rupture: Secondary | ICD-10-CM

## 2022-01-26 DIAGNOSIS — G4733 Obstructive sleep apnea (adult) (pediatric): Secondary | ICD-10-CM | POA: Diagnosis not present

## 2022-01-29 ENCOUNTER — Encounter: Payer: Self-pay | Admitting: Family Medicine

## 2022-02-01 DIAGNOSIS — K219 Gastro-esophageal reflux disease without esophagitis: Secondary | ICD-10-CM | POA: Diagnosis not present

## 2022-02-01 DIAGNOSIS — M1990 Unspecified osteoarthritis, unspecified site: Secondary | ICD-10-CM | POA: Diagnosis not present

## 2022-02-01 DIAGNOSIS — I110 Hypertensive heart disease with heart failure: Secondary | ICD-10-CM | POA: Diagnosis not present

## 2022-02-01 DIAGNOSIS — R32 Unspecified urinary incontinence: Secondary | ICD-10-CM | POA: Diagnosis not present

## 2022-02-01 DIAGNOSIS — Z6831 Body mass index (BMI) 31.0-31.9, adult: Secondary | ICD-10-CM | POA: Diagnosis not present

## 2022-02-01 DIAGNOSIS — Z881 Allergy status to other antibiotic agents status: Secondary | ICD-10-CM | POA: Diagnosis not present

## 2022-02-01 DIAGNOSIS — E669 Obesity, unspecified: Secondary | ICD-10-CM | POA: Diagnosis not present

## 2022-02-01 DIAGNOSIS — M109 Gout, unspecified: Secondary | ICD-10-CM | POA: Diagnosis not present

## 2022-02-01 DIAGNOSIS — E785 Hyperlipidemia, unspecified: Secondary | ICD-10-CM | POA: Diagnosis not present

## 2022-02-01 DIAGNOSIS — F419 Anxiety disorder, unspecified: Secondary | ICD-10-CM | POA: Diagnosis not present

## 2022-02-01 DIAGNOSIS — I2510 Atherosclerotic heart disease of native coronary artery without angina pectoris: Secondary | ICD-10-CM | POA: Diagnosis not present

## 2022-02-01 DIAGNOSIS — F329 Major depressive disorder, single episode, unspecified: Secondary | ICD-10-CM | POA: Diagnosis not present

## 2022-02-01 DIAGNOSIS — Z85820 Personal history of malignant melanoma of skin: Secondary | ICD-10-CM | POA: Diagnosis not present

## 2022-02-05 ENCOUNTER — Other Ambulatory Visit: Payer: Self-pay

## 2022-02-05 DIAGNOSIS — E1159 Type 2 diabetes mellitus with other circulatory complications: Secondary | ICD-10-CM

## 2022-02-05 DIAGNOSIS — E1142 Type 2 diabetes mellitus with diabetic polyneuropathy: Secondary | ICD-10-CM

## 2022-02-05 DIAGNOSIS — I152 Hypertension secondary to endocrine disorders: Secondary | ICD-10-CM

## 2022-02-05 DIAGNOSIS — I5032 Chronic diastolic (congestive) heart failure: Secondary | ICD-10-CM

## 2022-02-06 ENCOUNTER — Telehealth: Payer: Self-pay

## 2022-02-06 NOTE — Progress Notes (Signed)
Care Management & Coordination Services Pharmacy Team  Reason for Encounter: Appointment Reminder  Contacted patient to confirm in office appointment with Daron Offer PharmD, on 02/09/2022 at 10:00 am.  Spoke with patient on 02/06/2022   Do you have any problems getting your medications? No If yes what types of problems are you experiencing?   Patient denies any issue or problems at this time.   What is your top health concern you would like to discuss at your upcoming visit?  Over all health Have you seen any other providers since your last visit with PCP? No   Chart review:  Recent office visits:  09/25/2021 Rory Percy DO (PCP Office) Increase Allopurinol 200 mg daily,  Return in about 3 months   Recent consult visits:  12/29/2021 Dr. Patsey Berthold MD (Pulmonology) Trial of Trelegy started, follow-up in 3 months  09/28/2021 Dr. Patsey Berthold MD (Pulmonology) No Medication Changes noted, follow-up in 3 months   Hospital visits:  None in previous 6 months  Medications: Outpatient Encounter Medications as of 02/06/2022  Medication Sig Note   acetaminophen (TYLENOL) 500 MG tablet Take 500 mg by mouth every 6 (six) hours as needed.    albuterol (VENTOLIN HFA) 108 (90 Base) MCG/ACT inhaler TAKE 2 PUFFS BY MOUTH EVERY 6 HOURS AS NEEDED FOR WHEEZE OR SHORTNESS OF BREATH    allopurinol (ZYLOPRIM) 100 MG tablet TAKE 1 TABLET BY MOUTH EVERY DAY    allopurinol 200 MG TABS Take 200 mg by mouth daily.    alum & mag hydroxide-simeth (MAALOX/MYLANTA) 200-200-20 MG/5ML suspension Take 30 mLs by mouth every 4 (four) hours as needed for indigestion or heartburn.    ARIPiprazole (ABILIFY) 2 MG tablet TAKE 1 TABLET BY MOUTH EVERY DAY    aspirin 81 MG tablet Take 81 mg by mouth daily.    bismuth subsalicylate (PEPTO BISMOL) 262 MG chewable tablet Chew 262 mg by mouth as needed. **capsule** 03/17/2020: Almost daily due to diarrhea   calcium carbonate (TUMS - DOSED IN MG ELEMENTAL CALCIUM) 500 MG  chewable tablet Chew 1 tablet by mouth as needed for indigestion or heartburn.    clobetasol ointment (TEMOVATE) 0.05 % APPLY TO AFFECTED AREAS AT NIGHT AS NEEDED    clotrimazole (LOTRIMIN) 1 % cream Apply 1 application topically 2 (two) times daily. 03/17/2020: As needed   colestipol (COLESTID) 1 g tablet Take 1 g by mouth 2 (two) times daily.    famotidine (PEPCID) 20 MG tablet TAKE 1 TABLET BY MOUTH TWICE A DAY    ferrous sulfate 324 MG TBEC Take 324 mg by mouth daily with breakfast.    fluocinonide (LIDEX) 0.05 % external solution APPLY TO AFFECTED AREAS OF SCALP DAILY UNTIL CLEAR, THEN FOR FLARES 03/17/2020: As needed   fluticasone furoate-vilanterol (BREO ELLIPTA) 100-25 MCG/ACT AEPB Inhale 1 puff into the lungs daily.    Fluticasone-Umeclidin-Vilant (TRELEGY ELLIPTA) 100-62.5-25 MCG/ACT AEPB Inhale 1 puff into the lungs daily.    furosemide (LASIX) 20 MG tablet Take 1 tablet (20 mg total) by mouth daily.    lisinopril (ZESTRIL) 5 MG tablet TAKE 1 TABLET BY MOUTH EVERY DAY    Multiple Vitamins-Minerals (CENTRUM SILVER PO) Take 1 tablet by mouth daily.    omeprazole (PRILOSEC) 40 MG capsule Take 1 capsule (40 mg total) by mouth 2 (two) times daily.    oxybutynin (DITROPAN-XL) 10 MG 24 hr tablet TAKE 1 TABLET BY MOUTH EVERY DAY    potassium chloride (KLOR-CON) 10 MEQ tablet Take 1 tablet (10 mEq total) by mouth  daily.    rosuvastatin (CRESTOR) 10 MG tablet TAKE 1 TABLET BY MOUTH EVERY DAY    Venlafaxine HCl 225 MG TB24 TAKE 1 TABLET BY MOUTH EVERY DAY    No facility-administered encounter medications on file as of 02/06/2022.   Lab Results  Component Value Date/Time   HGBA1C 6.1 (A) 09/25/2021 01:36 PM   HGBA1C 6.2 (H) 05/04/2021 02:48 PM   HGBA1C 6.1 (H) 03/28/2021 08:17 AM    BP Readings from Last 3 Encounters:  12/29/21 120/80  09/28/21 102/70  09/25/21 117/78    Star Rating Drugs:  Medication: Lisinopril 5 mg Last Fill: 09/25/2021 90 Day Supply at [facility] Medication:  Rosuvastatin 10 mg Last Fill: 11/18/2021 90 Day Supply [facility]  Care Gaps: Annual wellness visit in last year? Yes, last completed 03/22/2022 COVID-19 Vaccine Dtap Vaccine   If Diabetic: Last eye exam / retinopathy screening: last completed 11/21/2021 Last diabetic foot exam: last completed 03/17/2020   [facility] Clinical Pharmacist Assistant [phone]

## 2022-02-09 ENCOUNTER — Ambulatory Visit: Payer: Medicare PPO

## 2022-02-09 DIAGNOSIS — M8589 Other specified disorders of bone density and structure, multiple sites: Secondary | ICD-10-CM

## 2022-02-09 DIAGNOSIS — J432 Centrilobular emphysema: Secondary | ICD-10-CM

## 2022-02-09 DIAGNOSIS — I5032 Chronic diastolic (congestive) heart failure: Secondary | ICD-10-CM

## 2022-02-09 DIAGNOSIS — K219 Gastro-esophageal reflux disease without esophagitis: Secondary | ICD-10-CM

## 2022-02-09 NOTE — Progress Notes (Signed)
Care Management & Coordination Services Pharmacy Note  02/09/2022 Name:  Natasha Vasquez MRN:  [id number] DOB:  Jun 01, 1940  Summary: Patient presents for initial consult.   -Patient denies consistent use of Trelegy; patient often forgets to take. This is due to only mild improvement in symptoms.   -Patient high risk for fracture.   Patient considering [facility], but defers for today.  Recommendations/Changes made from today's visit: - Inhaler technique reviewed in person today.  Continue optimizing vitamin D and calcium intake, will plan to recheck DEXA in 3 months and then broach discussion of osteoporosis agents.  Follow up plan: CPP follow-up 3 months   Subjective: Natasha Vasquez is an 82 y.o. year old female who is a primary patient of Bacigalupo, Dionne Bucy, MD.  The care coordination team was consulted for assistance with disease management and care coordination needs.    Engaged with patient by telephone for follow up visit.  Recent office visits: 09/25/2021 Rory Percy DO (PCP Office) Increase Allopurinol 200 mg daily,  Return in about 3 months    Recent consult visits: 12/29/2021 Dr. Patsey Berthold MD (Pulmonology) Trial of Trelegy started, follow-up in 3 months  09/28/2021 Dr. Patsey Berthold MD (Pulmonology) No Medication Changes noted, follow-up in 3 months   Hospital visits: None in previous 6 months   Objective:  Lab Results  Component Value Date   CREATININE 0.87 05/08/2021   BUN 17 05/08/2021   EGFR 55 (L) 03/28/2021   GFRNONAA >60 05/08/2021   GFRAA 55 (L) 08/05/2019   NA 141 05/08/2021   K 3.9 05/08/2021   CALCIUM 8.4 (L) 05/08/2021   CO2 29 05/08/2021   GLUCOSE 104 (H) 05/08/2021    Lab Results  Component Value Date/Time   HGBA1C 6.1 (A) 09/25/2021 01:36 PM   HGBA1C 6.2 (H) 05/04/2021 02:48 PM   HGBA1C 6.1 (H) 03/28/2021 08:17 AM    Last diabetic Eye exam:  Lab Results  Component Value Date/Time   HMDIABEYEEXA No Retinopathy 11/21/2021  12:00 AM    Last diabetic Foot exam: No results found for: "HMDIABFOOTEX"   Lab Results  Component Value Date   CHOL 189 05/04/2021   HDL 40 (L) 05/04/2021   LDLCALC 104 (H) 05/04/2021   TRIG 224 (H) 05/04/2021   CHOLHDL 4.7 05/04/2021       Latest Ref Rng & Units 03/28/2021    8:17 AM 03/25/2020    8:29 AM 08/05/2019    8:43 AM  Hepatic Function  Total Protein 6.0 - 8.5 g/dL 7.5  7.0  6.7   Albumin 3.6 - 4.6 g/dL 5.0  4.5  4.5   AST 0 - 40 IU/L '29  18  17   '$ ALT 0 - 32 IU/L 33  57  21   Alk Phosphatase 44 - 121 IU/L 105  104  105   Total Bilirubin 0.0 - 1.2 mg/dL 0.4  0.2  0.2     Lab Results  Component Value Date/Time   TSH 4.050 03/28/2021 08:17 AM   TSH 3.060 03/25/2020 08:29 AM   FREET4 1.07 03/28/2021 08:17 AM   FREET4 0.97 03/25/2020 08:29 AM       Latest Ref Rng & Units 05/08/2021    8:03 AM 05/05/2021    5:08 AM 05/04/2021   12:13 PM  CBC  WBC 4.0 - 10.5 K/uL 9.9  12.7  18.2   Hemoglobin 12.0 - 15.0 g/dL 11.1  11.7  12.0   Hematocrit 36.0 - 46.0 % 35.8  37.4  37.3   Platelets 150 - 400 K/uL 331  290  315     No results found for: "VD25OH", "VITAMINB12"  Clinical ASCVD: No  The ASCVD Risk score (Arnett DK, et al., 2019) failed to calculate for the following reasons:   The 2019 ASCVD risk score is only valid for ages 26 to 67       09/25/2021    1:37 PM 05/16/2021   11:15 AM 03/21/2021    1:25 PM  Depression screen PHQ 2/9  Decreased Interest 0 1 0  Down, Depressed, Hopeless 0 1 0  PHQ - 2 Score 0 2 0  Altered sleeping 0 1   Tired, decreased energy 0 1   Change in appetite 0 0   Feeling bad or failure about yourself  0 0   Trouble concentrating 0 0   Moving slowly or fidgety/restless 0 0   Suicidal thoughts 0 0   PHQ-9 Score 0 4   Difficult doing work/chores Not difficult at all Not difficult at all      Social History   Tobacco Use  Smoking Status Former   Years: 2.00   Types: Cigarettes   Quit date: 1965   Years since quitting: 59.1   Smokeless Tobacco Never  Tobacco Comments   smoked in college--1 pack would last one week.   BP Readings from Last 3 Encounters:  12/29/21 120/80  09/28/21 102/70  09/25/21 117/78   Pulse Readings from Last 3 Encounters:  12/29/21 83  09/28/21 95  09/25/21 98   Wt Readings from Last 3 Encounters:  12/29/21 191 lb 6.4 oz (86.8 kg)  09/28/21 192 lb (87.1 kg)  09/25/21 189 lb 14.4 oz (86.1 kg)   BMI Readings from Last 3 Encounters:  12/29/21 32.85 kg/m  09/28/21 32.96 kg/m  09/25/21 31.12 kg/m    Allergies  Allergen Reactions   Codeine Other (See Comments)    Chest pain   Lorazepam Other (See Comments)    "Feels loopy"   Oxycodone Other (See Comments)    MENTAL CHANGES   Paregoric     Chest pain---tolerated MORPHINE   Penicillins Hives    Has patient had a PCN reaction causing immediate rash, facial/tongue/throat swelling, SOB or lightheadedness with hypotension: Yes Has patient had a PCN reaction causing severe rash involving mucus membranes or skin necrosis: No Has patient had a PCN reaction that required hospitalization: NO Has patient had a PCN reaction occurring within the last 10 years: No If all of the above answers are "NO", then may proceed with Cephalosporin use.  Can take Augmentin   Quinolones     PATIENT UNAWARE OF allergy to this class of antibiotics, only aware of allergy to [facility] Patient was warned about not using Cipro and similar antibiotics. Recent studies have raised concern that fluoroquinolone antibiotics could be associated with an increased risk of aortic aneurysm Fluoroquinolones have non-antimicrobial properties that might jeopardise the integrity of the extracellular matrix of the vascular wall In a  propensity score matched cohort study in [place], there was a 66% increased rate   Aleve [Naproxen Sodium] Hives, Rash and Other (See Comments)    headaches    Medications Reviewed Today     Reviewed by Tyler Pita, MD (Physician)  on 12/29/21 at 1210  Med List Status: <None>   Medication Order Taking? Sig Documenting Provider Last Dose Status Informant  acetaminophen (TYLENOL) 500 MG tablet [id number] Yes Take 500 mg by mouth every 6 (six) hours as needed.  [provider] Taking Active Self  albuterol (VENTOLIN HFA) 108 (90 Base) MCG/ACT inhaler [id number] Yes TAKE 2 PUFFS BY MOUTH EVERY 6 HOURS AS NEEDED FOR WHEEZE OR SHORTNESS OF Fults Blas, MD Taking Active   allopurinol (ZYLOPRIM) 100 MG tablet [id number] Yes TAKE 1 TABLET BY MOUTH EVERY DAY Virginia Crews, MD Taking Active   allopurinol 200 MG TABS [id number]  Take 200 mg by mouth daily. Myles Gip, DO  Expired 12/24/21 2359   alum & mag hydroxide-simeth (MAALOX/MYLANTA) 200-200-20 MG/5ML suspension [id number] Yes Take 30 mLs by mouth every 4 (four) hours as needed for indigestion or heartburn. Sidney Ace, MD Taking Active   ARIPiprazole (ABILIFY) 2 MG tablet [id number] Yes TAKE 1 TABLET BY MOUTH EVERY DAY Brita Romp Dionne Bucy, MD Taking Active   aspirin 81 MG tablet [id number] Yes Take 81 mg by mouth daily. [provider] Taking Active Self           Med Note Waldo Laine, Laurice Record Sep 12, 2017  1:12 PM)    bismuth subsalicylate (PEPTO BISMOL) 262 MG chewable tablet [id number] Yes Chew 262 mg by mouth as needed. **capsule** [provider] Taking Active Self           Med Note Hassan Buckler, MCKENZIE A   Thu Mar 17, 2020  1:35 PM) Almost daily due to diarrhea  calcium carbonate (TUMS - DOSED IN MG ELEMENTAL CALCIUM) 500 MG chewable tablet [id number] Yes Chew 1 tablet by mouth as needed for indigestion or heartburn. [provider] Taking Active Self  clobetasol ointment (TEMOVATE) 0.05 % [id number] Yes APPLY TO AFFECTED AREAS AT NIGHT AS NEEDED [provider] Taking Active Self  clotrimazole (LOTRIMIN) 1 % cream [id number] Yes Apply 1 application topically 2 (two) times daily. Virginia Crews,  MD Taking Active Self           Med Note Hassan Buckler, MCKENZIE A   Thu Mar 17, 2020  1:36 PM) As needed  colestipol (COLESTID) 1 g tablet [id number] Yes Take 1 g by mouth 2 (two) times daily. [provider] Taking Active Self  famotidine (PEPCID) 20 MG tablet [id number] Yes TAKE 1 TABLET BY MOUTH TWICE A DAY Bacigalupo, Dionne Bucy, MD Taking Active   ferrous sulfate 324 MG TBEC [id number] Yes Take 324 mg by mouth daily with breakfast. [provider] Taking Active Self  fluocinonide (LIDEX) 0.05 % external solution [id number] Yes APPLY TO AFFECTED AREAS OF SCALP DAILY UNTIL CLEAR, THEN FOR FLARES [provider] Taking Active Self           Med Note Hassan Buckler, MCKENZIE A   Thu Mar 17, 2020  1:36 PM) As needed  fluticasone furoate-vilanterol (BREO ELLIPTA) 100-25 MCG/ACT AEPB [id number] Yes Inhale 1 puff into the lungs daily. Tyler Pita, MD Taking Active   Fluticasone-Umeclidin-[facility] ELLIPTA) 100-62.5-25 MCG/ACT AEPB [id number] Yes Inhale 1 puff into the lungs daily. Tyler Pita, MD  Active   furosemide (LASIX) 20 MG tablet [id number] Yes Take 1 tablet (20 mg total) by mouth daily. Minna Merritts, MD Taking Active   lisinopril (ZESTRIL) 5 MG tablet [id number] Yes TAKE 1 TABLET BY MOUTH EVERY DAY Myles Gip, DO Taking Active   Multiple Vitamins-Minerals (CENTRUM SILVER PO) [id number] Yes Take 1 tablet by mouth daily. [provider] Taking Active Self  omeprazole (PRILOSEC) 40 MG capsule [id number] Yes Take 1 capsule (40 mg total) by mouth 2 (two) times daily. Virginia Crews,  MD Taking Active Self  oxybutynin (DITROPAN-XL) 10 MG 24 hr tablet [id number] Yes TAKE 1 TABLET BY MOUTH EVERY DAY Bacigalupo, Dionne Bucy, MD Taking Active   potassium chloride (KLOR-CON) 10 MEQ tablet [id number] Yes Take 1 tablet (10 mEq total) by mouth daily. Minna Merritts, MD Taking Active   rosuvastatin (CRESTOR) 10 MG tablet [id number] Yes TAKE 1 TABLET BY MOUTH  EVERY DAY Virginia Crews, MD Taking Active   Venlafaxine HCl 225 MG TB24 [id number] Yes TAKE 1 TABLET BY MOUTH EVERY DAY Bacigalupo, Dionne Bucy, MD Taking Active             SDOH:  (Social Determinants of Health) assessments and interventions performed: Yes SDOH Interventions    Flowsheet Row Office Visit from 05/16/2021 in [facility] from 03/21/2021 in [place] Office Visit from 03/17/2020 in [place]  SDOH Interventions     Food Insecurity Interventions -- Intervention Not Indicated --  Housing Interventions -- Intervention Not Indicated --  Transportation Interventions -- Intervention Not Indicated --  Depression Interventions/Treatment  PHQ2-9 Score <4 Follow-up Not Indicated -- PHQ2-9 Score <4 Follow-up Not Indicated  Financial Strain Interventions -- Intervention Not Indicated --  Physical Activity Interventions -- Intervention Not Indicated --  Stress Interventions -- Intervention Not Indicated --  Social Connections Interventions -- Intervention Not Indicated --       Medication Assistance: None required.  Patient affirms current coverage meets needs.  Medication Access: Within the past 30 days, how often has patient missed a dose of medication? None Is a pillbox or other method used to improve adherence? Yes  Factors that may affect medication adherence? lack of understanding of disease management and Pill burden Are meds synced by current pharmacy? No  Are meds delivered by current pharmacy? No  Does patient experience delays in picking up medications due to transportation concerns? No   [facility] Reviewed: Is patient disadvantaged to use [facility]?: No  Current Rx insurance plan: [employer] Name and location of Current pharmacy:  [id number] [address] [address]Phone: [phone]:  [phone] [facility] services reviewed with patient today?: Yes  Patient requests to transfer care to [facility]?: No  Reason patient declined to change pharmacies: TLuz Lexfrequently and Resistance to change  Compliance/Adherence/Medication fill history: Care Gaps: Annual wellness visit in last year? Yes, last completed 03/22/2022 COVID-19 Vaccine Dtap Vaccine   Star-Rating Drugs: Medication: Lisinopril 5 mg Last Fill: 09/25/2021 90 Day Supply at [facility] Medication: Rosuvastatin 10 mg Last Fill: 11/18/2021 90  Day Supply [facility]   Assessment/Plan  Heart Failure (Goal: manage symptoms and prevent exacerbations) -Controlled -Last ejection fraction: 60-65% (Date: Mar 2023) -HF type: Diastolic -NYHA Class: I (no actitivty limitation) -AHA HF Stage: A (HF risk factors present) -Current treatment: Furosemide 20 mg  Lisinopril 5 mg daily  -Medications previously tried: NA  -Current home BP/HR readings: Does not monitor routinely -Current home daily weights: Does not monitor routinely -Recommended to continue current medication  Hyperlipidemia: (LDL goal < 70) -Controlled -Current treatment: Rosuvastatin 10 mg daily  -Medications previously tried: NA  -Recommended to continue current medication  Shortness of Breath (Goal: control symptoms and prevent exacerbations) -Uncontrolled -Current treatment  Trelegy 1 puff daily  -Medications previously tried: NA  -Patient with complex, likely multifactorial shortness of breath. Pulmonology believes symptoms are a combination of asthma, OSA, and deconditioning.  -Pulmonary  function testing: (Apr 2023): FEV1 1.95 L or 99% predicted, FVC 2.63 L or 102% predicted, FEV1/FVC 74%, no bronchodilator response. Lung volumes normal with minimal air trapping. Diffusion capacity mildly reduced. Overall normal study.  -Patient denies consistent use of maintenance inhaler; patient often forgets to take Trelegy, thinks this  is due to only mild improvement in symptoms.  - Inhaler technique reviewed in person today.  -Recommended to continue current medication  Depression(Goal: Maintain symptom remission) -Controlled -Current treatment: Aripiprazole 2 mg daily  Venlafaxine 225 mg daily  -Medications previously tried/failed: NA -Worsening nightmares.  -Recommended to continue current medication  GERD (Goal: Prevent symptoms) -Controlled -History of severe symptoms, small hiatal hernia. -Current treatment  Famotidine 20 mg twice daily  Omeprazole 40 mg twice daily  Tums as needed  -Medications previously tried: NA -Consider tapering regimen in the future, continue for now.  -Recommended to continue current medication  Osteopenia (Goal Prevent fractures) -Uncontrolled -Fracture of right femur 2017, femoral neck fracture 2010 (friend fell on her).  -Last DEXA Scan: 05/03/20   T-Score femoral neck:   T-Score total hip: -1.9  T-Score lumbar spine: -0.1  T-Score forearm radius: -2.1  10-year probability of major osteoporotic fracture: 34.3%  10-year probability of hip fracture: 19.8% -Patient is a candidate for pharmacologic treatment due to history of hip fracture , T-Score -1.0 to -2.5 and 10-year risk of major osteoporotic fracture > 20%, and T-Score -1.0 to -2.5 and 10-year risk of hip fracture > 3% -Current treatment  Centrum Silver  -Medications previously tried: NA  -Patient high risk for fracture. Continue optimizing vitamin D and calcium intake, will plan to recheck DEXA in 3 months and then broach discussion of osteoporosis agents. -Recommended to continue current medication  Gout (Goal: Prevent gout flares) -Controlled -Last Gout Flare: Prior to Sep 2022 -Current treatment  Allopurinol 200 mg daily  -Medications previously tried: NA  -We discussed:  Counseled patient on low purine diet plan. Counseled patient to reduce consumption of high-fructose corn syrup, sweetened soft drinks, fruit  juices, meat, and seafood. -Recommended to continue current medication  Junius Argyle, PharmD, [place], [place] [phone]

## 2022-02-09 NOTE — Patient Instructions (Addendum)
Visit Information It was great speaking with you today!  Please let me know if you have any questions about our visit.   Print copy of patient instructions, educational materials, and care plan provided in person.   Junius Argyle, PharmD, [place], CPP  Clinical Pharmacist Practitioner  [facility] [place] Clinical Pharmacist Assistant [phone]

## 2022-02-26 DIAGNOSIS — G4733 Obstructive sleep apnea (adult) (pediatric): Secondary | ICD-10-CM | POA: Diagnosis not present

## 2022-03-08 ENCOUNTER — Other Ambulatory Visit: Payer: Self-pay | Admitting: Family Medicine

## 2022-03-08 DIAGNOSIS — I10 Essential (primary) hypertension: Secondary | ICD-10-CM

## 2022-03-08 DIAGNOSIS — F4322 Adjustment disorder with anxiety: Secondary | ICD-10-CM

## 2022-03-08 DIAGNOSIS — E1169 Type 2 diabetes mellitus with other specified complication: Secondary | ICD-10-CM

## 2022-03-08 NOTE — Telephone Encounter (Signed)
Requested medications are due for refill today.  Provider to decide  Requested medications are on the active medications list.  yes  Last refill. 11/16/2021 #90 1 rf  Future visit scheduled.   no  Notes to clinic.  Refill not delegated.    Requested Prescriptions  Pending Prescriptions Disp Refills   ARIPiprazole (ABILIFY) 2 MG tablet [Pharmacy Med Name: ARIPIPRAZOLE 2 MG TABLET] 90 tablet 1    Sig: TAKE 1 TABLET BY MOUTH EVERY DAY     Not Delegated - Psychiatry:  Antipsychotics - Second Generation (Atypical) - aripiprazole Failed - 03/08/2022  3:48 PM      Failed - This refill cannot be delegated      Failed - Lipid Panel in normal range within the last 12 months    Cholesterol, Total  Date Value Ref Range Status  03/28/2021 257 (H) 100 - 199 mg/dL Final   Cholesterol  Date Value Ref Range Status  05/04/2021 189 0 - 200 mg/dL Final   LDL Chol Calc (NIH)  Date Value Ref Range Status  03/28/2021 132 (H) 0 - 99 mg/dL Final   LDL Cholesterol  Date Value Ref Range Status  05/04/2021 104 (H) 0 - 99 mg/dL Final    Comment:           Total Cholesterol/HDL:CHD Risk Coronary Heart Disease Risk Table                     Men   Women  1/2 Average Risk   3.4   3.3  Average Risk       5.0   4.4  2 X Average Risk   9.6   7.1  3 X Average Risk  23.4   11.0        Use the calculated Patient Ratio above and the CHD Risk Table to determine the patient's CHD Risk.        ATP III CLASSIFICATION (LDL):  <100     mg/dL   Optimal  100-129  mg/dL   Near or Above                    Optimal  130-159  mg/dL   Borderline  160-189  mg/dL   High  >190     mg/dL   Very High Performed at [place], [place] [address]    HDL  Date Value Ref Range Status  05/04/2021 40 (L) >40 mg/dL Final  03/28/2021 41 >39 mg/dL Final   Triglycerides  Date Value Ref Range Status  05/04/2021 224 (H) <150 mg/dL Final         Failed - CBC within normal limits and  completed in the last 12 months    WBC  Date Value Ref Range Status  05/08/2021 9.9 4.0 - 10.5 K/uL Final   RBC  Date Value Ref Range Status  05/08/2021 4.12 3.87 - 5.11 MIL/uL Final   Hemoglobin  Date Value Ref Range Status  05/08/2021 11.1 (L) 12.0 - 15.0 g/dL Final  09/17/2018 10.4 (L) 11.1 - 15.9 g/dL Final   HCT  Date Value Ref Range Status  05/08/2021 35.8 (L) 36.0 - 46.0 % Final   Hematocrit  Date Value Ref Range Status  09/17/2018 33.0 (L) 34.0 - 46.6 % Final   MCHC  Date Value Ref Range Status  05/08/2021 31.0 30.0 - 36.0 g/dL Final   [facility]  Date Value Ref Range Status  05/08/2021 26.9 26.0 -  34.0 pg Final   MCV  Date Value Ref Range Status  05/08/2021 86.9 80.0 - 100.0 fL Final  09/17/2018 78 (L) 79 - 97 fL Final   No results found for: "PLTCOUNTKUC", "LABPLAT", "POCPLA" RDW  Date Value Ref Range Status  05/08/2021 15.9 (H) 11.5 - 15.5 % Final  09/17/2018 16.5 (H) 11.7 - 15.4 % Final         Passed - TSH in normal range and within 360 days    TSH  Date Value Ref Range Status  03/28/2021 4.050 0.450 - 4.500 uIU/mL Final         Passed - Completed PHQ-2 or PHQ-9 in the last 360 days      Passed - Last BP in normal range    BP Readings from Last 1 Encounters:  12/29/21 120/80         Passed - Last Heart Rate in normal range    Pulse Readings from Last 1 Encounters:  12/29/21 83         Passed - Valid encounter within last 6 months    Recent Outpatient Visits           5 months ago Hypertension associated with diabetes [facility])   [place] Rory Percy M, DO   9 months ago Community acquired pneumonia of left lung, unspecified part of lung   [place], Dionne Bucy, MD   11 months ago Encounter for annual physical exam   [place], Dionne Bucy, MD   1 year ago Chronic diarrhea of unknown origin   [place], Donald E, MD   1 year ago Encounter for annual physical exam   [facility] [place], Dionne Bucy, MD              Passed - CMP within normal limits and completed in the last 12 months    Albumin  Date Value Ref Range Status  03/28/2021 5.0 (H) 3.6 - 4.6 g/dL Final   Alkaline Phosphatase  Date Value Ref Range Status  03/28/2021 105 44 - 121 IU/L Final   ALT  Date Value Ref Range Status  03/28/2021 33 (H) 0 - 32 IU/L Final   AST  Date Value Ref Range Status  03/28/2021 29 0 - 40 IU/L Final   BUN  Date Value Ref Range Status  05/08/2021 17 8 - 23 mg/dL Final  03/28/2021 17 8 - 27 mg/dL Final   Calcium  Date Value Ref Range Status  05/08/2021 8.4 (L) 8.9 - 10.3 mg/dL Final   CO2  Date Value Ref Range Status  05/08/2021 29 22 - 32 mmol/L Final   Creatinine, Ser  Date Value Ref Range Status  05/08/2021 0.87 0.44 - 1.00 mg/dL Final   Glucose, Bld  Date Value Ref Range Status  05/08/2021 104 (H) 70 - 99 mg/dL Final    Comment:    Glucose reference range applies only to samples taken after fasting for at least 8 hours.   Glucose-Capillary  Date Value Ref Range Status  05/08/2021 99 70 - 99 mg/dL Final    Comment:    Glucose reference range applies only to samples taken after fasting for at least 8 hours.   Potassium  Date Value Ref Range Status  05/08/2021 3.9 3.5 - 5.1 mmol/L Final   Sodium  Date Value Ref Range Status  05/08/2021 141 135 - 145 mmol/L  Final  03/28/2021 140 134 - 144 mmol/L Final   Bilirubin Total  Date Value Ref Range Status  03/28/2021 0.4 0.0 - 1.2 mg/dL Final   Protein, ur  Date Value Ref Range Status  10/07/2018 NEGATIVE NEGATIVE mg/dL Final   Total Protein  Date Value Ref Range Status  03/28/2021 7.5 6.0 - 8.5 g/dL Final   GFR calc Af Amer  Date Value Ref Range Status  08/05/2019 55 (L) >59 mL/min/1.73 Final    Comment:    **[facility] currently reports eGFR in compliance with the current**    recommendations of the [employer]. [facility] will   update reporting as new guidelines are published from the NKF-ASN   Task force.    eGFR  Date Value Ref Range Status  03/28/2021 55 (L) >59 mL/min/1.73 Final   GFR, Estimated  Date Value Ref Range Status  05/08/2021 >60 >60 mL/min Final    Comment:    (NOTE) Calculated using the CKD-EPI Creatinine Equation (2021)          Signed Prescriptions Disp Refills   rosuvastatin (CRESTOR) 10 MG tablet 90 tablet 0    Sig: TAKE 1 TABLET BY MOUTH EVERY DAY     Cardiovascular:  Antilipid - Statins 2 Failed - 03/08/2022  3:48 PM      Failed - Lipid Panel in normal range within the last 12 months    Cholesterol, Total  Date Value Ref Range Status  03/28/2021 257 (H) 100 - 199 mg/dL Final   Cholesterol  Date Value Ref Range Status  05/04/2021 189 0 - 200 mg/dL Final   LDL Chol Calc (NIH)  Date Value Ref Range Status  03/28/2021 132 (H) 0 - 99 mg/dL Final   LDL Cholesterol  Date Value Ref Range Status  05/04/2021 104 (H) 0 - 99 mg/dL Final    Comment:           Total Cholesterol/HDL:CHD Risk Coronary Heart Disease Risk Table                     Men   Women  1/2 Average Risk   3.4   3.3  Average Risk       5.0   4.4  2 X Average Risk   9.6   7.1  3 X Average Risk  23.4   11.0        Use the calculated Patient Ratio above and the CHD Risk Table to determine the patient's CHD Risk.        ATP III CLASSIFICATION (LDL):  <100     mg/dL   Optimal  100-129  mg/dL   Near or Above                    Optimal  130-159  mg/dL   Borderline  160-189  mg/dL   High  >190     mg/dL   Very High Performed at [facility], [place],  [address]    HDL  Date Value Ref Range Status  05/04/2021 40 (L) >40 mg/dL Final  03/28/2021 41 >39 mg/dL Final   Triglycerides  Date Value Ref Range Status  05/04/2021 224 (H) <150 mg/dL Final         Passed - Cr in normal range and within 360 days     Creatinine, Ser  Date Value Ref Range Status  05/08/2021 0.87 0.44 - 1.00 mg/dL Final  Passed - Patient is not pregnant      Passed - Valid encounter within last 12 months    Recent Outpatient Visits           5 months ago Hypertension associated with diabetes [facility])   [place] Myles Gip, DO   9 months ago Community acquired pneumonia of left lung, unspecified part of lung   [place], Dionne Bucy, MD   11 months ago Encounter for annual physical exam   [facility] [place], Dionne Bucy, MD   1 year ago Chronic diarrhea of unknown origin   [facility] Birdie Sons, MD   1 year ago Encounter for annual physical exam   [place], Dionne Bucy, MD               famotidine (PEPCID) 20 MG tablet 180 tablet 0    Sig: TAKE 1 TABLET BY MOUTH TWICE A DAY     Gastroenterology:  H2 Antagonists Passed - 03/08/2022  3:48 PM      Passed - Valid encounter within last 12 months    Recent Outpatient Visits           5 months ago Hypertension associated with diabetes [facility])   [place] Myles Gip, DO   9 months ago Community acquired pneumonia of left lung, unspecified part of lung   [facility] [place], Dionne Bucy, MD   11 months ago Encounter for annual physical exam   [facility] [place], Dionne Bucy, MD   1 year ago Chronic diarrhea of unknown origin   [facility] Birdie Sons, MD   1 year ago Encounter for annual physical exam   [facility] [place], Dionne Bucy, MD

## 2022-03-08 NOTE — Telephone Encounter (Signed)
Requested Prescriptions  Pending Prescriptions Disp Refills   rosuvastatin (CRESTOR) 10 MG tablet [Pharmacy Med Name: ROSUVASTATIN CALCIUM 10 MG TAB] 90 tablet 0    Sig: TAKE 1 TABLET BY MOUTH EVERY DAY     Cardiovascular:  Antilipid - Statins 2 Failed - 03/08/2022  3:48 PM      Failed - Lipid Panel in normal range within the last 12 months    Cholesterol, Total  Date Value Ref Range Status  03/28/2021 257 (H) 100 - 199 mg/dL Final   Cholesterol  Date Value Ref Range Status  05/04/2021 189 0 - 200 mg/dL Final   LDL Chol Calc (NIH)  Date Value Ref Range Status  03/28/2021 132 (H) 0 - 99 mg/dL Final   LDL Cholesterol  Date Value Ref Range Status  05/04/2021 104 (H) 0 - 99 mg/dL Final    Comment:           Total Cholesterol/HDL:CHD Risk Coronary Heart Disease Risk Table                     Men   Women  1/2 Average Risk   3.4   3.3  Average Risk       5.0   4.4  2 X Average Risk   9.6   7.1  3 X Average Risk  23.4   11.0        Use the calculated Patient Ratio above and the CHD Risk Table to determine the patient's CHD Risk.        ATP III CLASSIFICATION (LDL):  <100     mg/dL   Optimal  100-129  mg/dL   Near or Above                    Optimal  130-159  mg/dL   Borderline  160-189  mg/dL   High  >190     mg/dL   Very High Performed at [facility], [place], [place] [address]    HDL  Date Value Ref Range Status  05/04/2021 40 (L) >40 mg/dL Final  03/28/2021 41 >39 mg/dL Final   Triglycerides  Date Value Ref Range Status  05/04/2021 224 (H) <150 mg/dL Final         Passed - Cr in normal range and within 360 days    Creatinine, Ser  Date Value Ref Range Status  05/08/2021 0.87 0.44 - 1.00 mg/dL Final         Passed - Patient is not pregnant      Passed - Valid encounter within last 12 months    Recent Outpatient Visits           5 months ago Hypertension associated with diabetes [facility])   [place]  Rory Percy M, DO   9 months ago Community acquired pneumonia of left lung, unspecified part of lung   [place], Dionne Bucy, MD   11 months ago Encounter for annual physical exam   [facility] [place], Dionne Bucy, MD   1 year ago Chronic diarrhea of unknown origin   [place], Donald E, MD   1 year ago Encounter for annual physical exam   [place], Dionne Bucy, MD               famotidine (PEPCID) 20 MG tablet [Pharmacy Med Name: FAMOTIDINE 20 MG  TABLET] 180 tablet 0    Sig: TAKE 1 TABLET BY MOUTH TWICE A DAY     Gastroenterology:  H2 Antagonists Passed - 03/08/2022  3:48 PM      Passed - Valid encounter within last 12 months    Recent Outpatient Visits           5 months ago Hypertension associated with diabetes [facility])   [place] Myles Gip, DO   9 months ago Community acquired pneumonia of left lung, unspecified part of lung   [place], Dionne Bucy, MD   11 months ago Encounter for annual physical exam   [place], Dionne Bucy, MD   1 year ago Chronic diarrhea of unknown origin   [facility] Birdie Sons, MD   1 year ago Encounter for annual physical exam   [place], Dionne Bucy, MD               ARIPiprazole (ABILIFY) 2 MG tablet [Pharmacy Med Name: ARIPIPRAZOLE 2 MG TABLET] 90 tablet 1    Sig: TAKE 1 TABLET BY MOUTH EVERY DAY     Not Delegated - Psychiatry:  Antipsychotics - Second Generation (Atypical) - aripiprazole Failed - 03/08/2022  3:48 PM      Failed - This refill cannot be delegated      Failed - Lipid Panel in normal range within the last 12 months    Cholesterol, Total  Date Value Ref Range Status  03/28/2021 257 (H) 100 - 199 mg/dL  Final   Cholesterol  Date Value Ref Range Status  05/04/2021 189 0 - 200 mg/dL Final   LDL Chol Calc (NIH)  Date Value Ref Range Status  03/28/2021 132 (H) 0 - 99 mg/dL Final   LDL Cholesterol  Date Value Ref Range Status  05/04/2021 104 (H) 0 - 99 mg/dL Final    Comment:           Total Cholesterol/HDL:CHD Risk Coronary Heart Disease Risk Table                     Men   Women  1/2 Average Risk   3.4   3.3  Average Risk       5.0   4.4  2 X Average Risk   9.6   7.1  3 X Average Risk  23.4   11.0        Use the calculated Patient Ratio above and the CHD Risk Table to determine the patient's CHD Risk.        ATP III CLASSIFICATION (LDL):  <100     mg/dL   Optimal  100-129  mg/dL   Near or Above                    Optimal  130-159  mg/dL   Borderline  160-189  mg/dL   High  >190     mg/dL   Very High Performed at [facility], [place], [place] [address]    HDL  Date Value Ref Range Status  05/04/2021 40 (L) >40 mg/dL Final  03/28/2021 41 >39 mg/dL Final   Triglycerides  Date Value Ref Range Status  05/04/2021 224 (H) <150 mg/dL Final         Failed - CBC within normal limits and completed in the last 12 months  WBC  Date Value Ref Range Status  05/08/2021 9.9 4.0 - 10.5 K/uL Final   RBC  Date Value Ref Range Status  05/08/2021 4.12 3.87 - 5.11 MIL/uL Final   Hemoglobin  Date Value Ref Range Status  05/08/2021 11.1 (L) 12.0 - 15.0 g/dL Final  09/17/2018 10.4 (L) 11.1 - 15.9 g/dL Final   HCT  Date Value Ref Range Status  05/08/2021 35.8 (L) 36.0 - 46.0 % Final   Hematocrit  Date Value Ref Range Status  09/17/2018 33.0 (L) 34.0 - 46.6 % Final   MCHC  Date Value Ref Range Status  05/08/2021 31.0 30.0 - 36.0 g/dL Final   [facility]  Date Value Ref Range Status  05/08/2021 26.9 26.0 - 34.0 pg Final   MCV  Date Value Ref Range Status  05/08/2021 86.9 80.0 - 100.0 fL Final  09/17/2018 78 (L) 79 - 97 fL Final   No results  found for: "PLTCOUNTKUC", "LABPLAT", "POCPLA" RDW  Date Value Ref Range Status  05/08/2021 15.9 (H) 11.5 - 15.5 % Final  09/17/2018 16.5 (H) 11.7 - 15.4 % Final         Passed - TSH in normal range and within 360 days    TSH  Date Value Ref Range Status  03/28/2021 4.050 0.450 - 4.500 uIU/mL Final         Passed - Completed PHQ-2 or PHQ-9 in the last 360 days      Passed - Last BP in normal range    BP Readings from Last 1 Encounters:  12/29/21 120/80         Passed - Last Heart Rate in normal range    Pulse Readings from Last 1 Encounters:  12/29/21 83         Passed - Valid encounter within last 6 months    Recent Outpatient Visits           5 months ago Hypertension associated with diabetes [facility])   [place] Myles Gip, DO   9 months ago Community acquired pneumonia of left lung, unspecified part of lung   [place], Dionne Bucy, MD   11 months ago Encounter for annual physical exam   [place], Dionne Bucy, MD   1 year ago Chronic diarrhea of unknown origin   [place], Donald E, MD   1 year ago Encounter for annual physical exam   [facility] [place], Dionne Bucy, MD              Passed - CMP within normal limits and completed in the last 12 months    Albumin  Date Value Ref Range Status  03/28/2021 5.0 (H) 3.6 - 4.6 g/dL Final   Alkaline Phosphatase  Date Value Ref Range Status  03/28/2021 105 44 - 121 IU/L Final   ALT  Date Value Ref Range Status  03/28/2021 33 (H) 0 - 32 IU/L Final   AST  Date Value Ref Range Status  03/28/2021 29 0 - 40 IU/L Final   BUN  Date Value Ref Range Status  05/08/2021 17 8 - 23 mg/dL Final  03/28/2021 17 8 - 27 mg/dL Final   Calcium  Date Value Ref Range Status  05/08/2021 8.4 (L) 8.9 - 10.3 mg/dL Final   CO2  Date Value Ref Range Status   05/08/2021 29 22 - 32 mmol/L Final   Creatinine, Ser  Date Value Ref Range Status  05/08/2021 0.87 0.44 - 1.00 mg/dL Final   Glucose, Bld  Date Value Ref Range Status  05/08/2021 104 (H) 70 - 99 mg/dL Final    Comment:    Glucose reference range applies only to samples taken after fasting for at least 8 hours.   Glucose-Capillary  Date Value Ref Range Status  05/08/2021 99 70 - 99 mg/dL Final    Comment:    Glucose reference range applies only to samples taken after fasting for at least 8 hours.   Potassium  Date Value Ref Range Status  05/08/2021 3.9 3.5 - 5.1 mmol/L Final   Sodium  Date Value Ref Range Status  05/08/2021 141 135 - 145 mmol/L Final  03/28/2021 140 134 - 144 mmol/L Final   Bilirubin Total  Date Value Ref Range Status  03/28/2021 0.4 0.0 - 1.2 mg/dL Final   Protein, ur  Date Value Ref Range Status  10/07/2018 NEGATIVE NEGATIVE mg/dL Final   Total Protein  Date Value Ref Range Status  03/28/2021 7.5 6.0 - 8.5 g/dL Final   GFR calc Af Amer  Date Value Ref Range Status  08/05/2019 55 (L) >59 mL/min/1.73 Final    Comment:    **[facility] currently reports eGFR in compliance with the current**   recommendations of the [employer]. [facility] will   update reporting as new guidelines are published from the NKF-ASN   Task force.    eGFR  Date Value Ref Range Status  03/28/2021 55 (L) >59 mL/min/1.73 Final   GFR, Estimated  Date Value Ref Range Status  05/08/2021 >60 >60 mL/min Final    Comment:    (NOTE) Calculated using the CKD-EPI Creatinine Equation (2021)

## 2022-03-08 NOTE — Telephone Encounter (Signed)
Requested medication (s) are due for refill today: yes  Requested medication (s) are on the active medication list: yes    Last refill: 09/25/21  #90  0 refills  Future visit scheduled no  Notes to clinic:FAiled due to labs, please review. Thank you.  Requested Prescriptions  Pending Prescriptions Disp Refills   lisinopril (ZESTRIL) 5 MG tablet [Pharmacy Med Name: LISINOPRIL 5 MG TABLET] 90 tablet 0    Sig: TAKE 1 TABLET BY MOUTH EVERY DAY     Cardiovascular:  ACE Inhibitors Failed - 03/08/2022  3:48 PM      Failed - Cr in normal range and within 180 days    Creatinine, Ser  Date Value Ref Range Status  05/08/2021 0.87 0.44 - 1.00 mg/dL Final         Failed - K in normal range and within 180 days    Potassium  Date Value Ref Range Status  05/08/2021 3.9 3.5 - 5.1 mmol/L Final         Passed - Patient is not pregnant      Passed - Last BP in normal range    BP Readings from Last 1 Encounters:  12/29/21 120/80         Passed - Valid encounter within last 6 months    Recent Outpatient Visits           5 months ago Hypertension associated with diabetes [facility])   [place] Myles Gip, DO   9 months ago Community acquired pneumonia of left lung, unspecified part of lung   [place], Dionne Bucy, MD   11 months ago Encounter for annual physical exam   [facility] [place], Dionne Bucy, MD   1 year ago Chronic diarrhea of unknown origin   [place], Donald E, MD   1 year ago Encounter for annual physical exam   [facility] [place], Dionne Bucy, MD

## 2022-03-09 ENCOUNTER — Ambulatory Visit
Admission: RE | Admit: 2022-03-09 | Discharge: 2022-03-09 | Disposition: A | Discharge status: Home / Self Care | Payer: Medicare PPO | Source: Ambulatory Visit | Attending: Thoracic Surgery (Cardiothoracic Vascular Surgery) | Admitting: Thoracic Surgery (Cardiothoracic Vascular Surgery)

## 2022-03-09 DIAGNOSIS — I7121 Aneurysm of the ascending aorta, without rupture: Secondary | ICD-10-CM | POA: Diagnosis not present

## 2022-03-09 DIAGNOSIS — I700 Atherosclerosis of aorta: Secondary | ICD-10-CM | POA: Diagnosis not present

## 2022-03-10 ENCOUNTER — Other Ambulatory Visit: Payer: Self-pay | Admitting: Family Medicine

## 2022-03-12 ENCOUNTER — Telehealth: Payer: Self-pay | Admitting: Family Medicine

## 2022-03-12 NOTE — Telephone Encounter (Signed)
Contacted [place] to schedule their annual wellness visit. Appointment made for 04/23/2022.  [place] Direct Dial: [phone]

## 2022-03-13 ENCOUNTER — Ambulatory Visit: Payer: Medicare PPO | Admitting: Thoracic Surgery (Cardiothoracic Vascular Surgery)

## 2022-03-27 ENCOUNTER — Ambulatory Visit: Payer: Medicare PPO | Admitting: Thoracic Surgery (Cardiothoracic Vascular Surgery)

## 2022-03-27 VITALS — BP 156/95 | HR 87 | Resp 20 | Ht 64.0 in | Wt 190.0 lb

## 2022-03-27 DIAGNOSIS — G4733 Obstructive sleep apnea (adult) (pediatric): Secondary | ICD-10-CM | POA: Diagnosis not present

## 2022-03-27 DIAGNOSIS — I7121 Aneurysm of the ascending aorta, without rupture: Secondary | ICD-10-CM | POA: Diagnosis not present

## 2022-03-27 MED ORDER — METOPROLOL SUCCINATE ER 25 MG PO TB24: 25.0000 mg | ORAL_TABLET | Freq: Every day | ORAL | 5 refills | Status: DC

## 2022-03-27 NOTE — Progress Notes (Signed)
[address]       [place] [address]             [phone]     HPI: Natasha Vasquez returns for follow-up of her ascending aneurysm.  Natasha Vasquez is an 82 year old woman with a history of an ascending thoracic aneurysm, hypertension, hyperlipidemia, mitral valve prolapse, PAD, breast cancer, melanoma, reflux, Barrett's esophagitis, nephritis, arthritis, type 2 diabetes, depression, and pneumonia.  I have been following her for an ascending aneurysm since 2019.  Her most recent scan was read at 4.4 cm by radiology.  On personal measurement was about 4.5 to 4.6 cm..  In the interim since her last visit she has been under a great deal of stress.  She complains of fatiguing easily.  No chest pain, pressure, or tightness.  She had a coronary CT which showed a calcium score of 0.  She has been under a lot of stress as her husband has some early dementia.  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  ER/PR NEGATIVE   Cataract    Chronic cough    Complication of anesthesia    20 years ago pts b/p dropped really low, "coded"   Depression    Depression    Diabetes mellitus without complication (HCC)    diet controlled   Family history of adverse reaction to anesthesia    son PONV   GERD (gastroesophageal reflux disease)    Gout    Hypercholesteremia    Hyperlipidemia    Hypertension    Melanoma ([place])    MVP (mitral valve prolapse)    followed by PCP   Nephritis    Nephritis    S/P appendectomy    Thoracic aortic aneurysm (HCC)    Torn meniscus    left    Current Outpatient Medications  Medication Sig Dispense Refill   acetaminophen (TYLENOL) 500 MG tablet Take 500 mg by mouth every 6 (six) hours as needed.     albuterol (VENTOLIN HFA) 108 (90 Base) MCG/ACT inhaler TAKE 2 PUFFS BY MOUTH EVERY 6  HOURS AS NEEDED FOR WHEEZE OR SHORTNESS OF BREATH 8.5 each 2   ARIPiprazole (ABILIFY) 2 MG tablet TAKE 1 TABLET BY MOUTH EVERY DAY 90 tablet 1   aspirin 81 MG tablet Take 81 mg by mouth daily.     bismuth subsalicylate (PEPTO BISMOL) 262 MG chewable tablet Chew 262 mg by mouth as needed. **capsule**     calcium carbonate (TUMS - DOSED IN MG ELEMENTAL CALCIUM) 500 MG chewable tablet Chew 1 tablet by mouth as needed for indigestion or heartburn.     carboxymethylcellulose (REFRESH PLUS) 0.5 % SOLN Place 1 drop into both eyes 3 (three) times daily as needed (Dry Eyes).     colestipol (COLESTID) 1 g tablet Take 1 g by mouth 2 (two) times daily.     famotidine (PEPCID) 20 MG tablet TAKE 1 TABLET BY MOUTH TWICE A DAY 180 tablet 0   ferrous sulfate 324 MG TBEC Take 324 mg by mouth daily with breakfast.     fluocinonide (LIDEX) 0.05 % external solution APPLY TO AFFECTED AREAS OF SCALP DAILY UNTIL CLEAR, THEN FOR FLARES     Fluticasone-Umeclidin-Vilant (TRELEGY ELLIPTA) 100-62.5-25 MCG/ACT AEPB Inhale 1 puff into the  lungs daily. 28 each 0   furosemide (LASIX) 20 MG tablet Take 1 tablet (20 mg total) by mouth daily. 90 tablet 3   lisinopril (ZESTRIL) 5 MG tablet Take 1 tablet (5 mg total) by mouth daily. Please schedule office visit before any future refills 90 tablet 0   metoprolol succinate (TOPROL XL) 25 MG 24 hr tablet Take 1 tablet (25 mg total) by mouth daily. 30 tablet 5   Multiple Vitamins-Minerals (CENTRUM SILVER PO) Take 1 tablet by mouth daily.     omeprazole (PRILOSEC) 40 MG capsule Take 1 capsule (40 mg total) by mouth 2 (two) times daily. 180 capsule 1   oxybutynin (DITROPAN-XL) 10 MG 24 hr tablet TAKE 1 TABLET BY MOUTH EVERY DAY 90 tablet 1   potassium chloride (KLOR-CON) 10 MEQ tablet Take 1 tablet (10 mEq total) by mouth daily. 90 tablet 3   rosuvastatin (CRESTOR) 10 MG tablet TAKE 1 TABLET BY MOUTH EVERY DAY 90 tablet 0   Venlafaxine HCl 225 MG TB24 Take 1 tablet (225 mg total) by mouth  daily. Please schedule office visit before any future refills 90 tablet 0   allopurinol 200 MG TABS Take 200 mg by mouth daily. 90 tablet 1   No current facility-administered medications for this visit.    Physical Exam BP (!) 156/95   Pulse 87   Resp 20   Ht '5\' 4"'$  (1.626 m)   Wt 190 lb (86.2 kg)   SpO2 94% Comment: RA  BMI 32.75 kg/m  82 year old woman in no acute distress Alert and oriented x 3 with no focal deficits Lungs clear bilaterally Cardiac regular rate and rhythm, no murmur  Diagnostic Tests: CT CHEST WITHOUT CONTRAST   TECHNIQUE: Multidetector CT imaging of the chest was performed following the standard protocol without IV contrast.   RADIATION DOSE REDUCTION: This exam was performed according to the departmental dose-optimization program which includes automated exposure control, adjustment of the mA and/or kV according to patient size and/or use of iterative reconstruction technique.   COMPARISON:  06/01/2021, 05/04/2021   FINDINGS: Cardiovascular: Mid ascending thoracic aorta measures approximately 4.8 x 4.6 cm (previously 4.6 cm on 05/04/2021). Scattered atherosclerotic vascular calcifications of the aorta. Central pulmonary vasculature is nondilated. Heart size is normal. No pericardial effusion.   Mediastinum/Nodes: No enlarged mediastinal or axillary lymph nodes. Thyroid gland, trachea, and esophagus demonstrate no significant findings. Small hiatal hernia.   Lungs/Pleura: Lungs are clear. No pleural effusion or pneumothorax.   Upper Abdomen: Hepatic steatosis. No acute abnormality within the included upper abdomen.   Musculoskeletal: No acute osseous abnormality. No suspicious bone lesions. Right breast prosthesis.   IMPRESSION: 1. Enlarging ascending thoracic aortic aneurysm measuring up to 4.8 cm (previously 4.6 cm on 05/04/2021). 2. Hepatic steatosis. 3. Small hiatal hernia. 4. Aortic atherosclerosis (ICD10-I70.0).     Electronically  Signed   By: Davina Poke D.O.   On: 03/09/2022 13:48 I personally reviewed the CT images.  The CT was done without contrast so the measurements are as precise.  Aneurysm may be slightly larger, but not enough to warrant surgical intervention.  Will plan to do her next scan with contrast to get a more precise measurement.  Impression: Natasha Vasquez is an 82 year old woman with a history of an ascending thoracic aneurysm, hypertension, hyperlipidemia, mitral valve prolapse, PAD, breast cancer, melanoma, reflux, Barrett's esophagitis, nephritis, arthritis, type 2 diabetes, depression, and pneumonia.  Ascending aneurysm-measured around 4.6 cm.  Radiology measured at 4.8 cm.  I can  see one area on the axials where it appears to be that size but is not a true cross-section.  It does look a little larger than it was at her last visit.  There is no indication for surgery but I do think we need to see her again in 6 months.  Will do a CT angiogram to get a more precise measurement at that time.  Hypertension-blood pressure is elevated.  She is on lisinopril and Lasix.  She is checking herself at home, but not necessarily regularly.  Her aorta is large enough that we do not need her blood pressure being greater than 150 at any time and it has been high on multiple occasions.  I am going to start her on Toprol-XL 25 mg p.o. daily.  I advised her to continue all of her other medications.  Plan: Continue lisinopril Add Toprol-XL 25 mg p.o. daily Monitor blood pressure at home on a regular basis. Return in 6 months with CT angio of chest  I spent over 20 minutes in review of records, images, and in consultation with Mrs. Caamano today. Melrose Nakayama, MD [facility] [phone]

## 2022-04-05 ENCOUNTER — Other Ambulatory Visit
Admission: RE | Admit: 2022-04-05 | Discharge: 2022-04-05 | Disposition: A | Discharge status: Home / Self Care | Payer: Medicare PPO | Source: Ambulatory Visit | Attending: Pulmonary Disease | Admitting: Pulmonary Disease

## 2022-04-05 ENCOUNTER — Ambulatory Visit: Payer: Medicare PPO | Admitting: Pulmonary Disease

## 2022-04-05 ENCOUNTER — Encounter: Payer: Self-pay | Admitting: Pulmonary Disease

## 2022-04-05 VITALS — BP 120/80 | HR 71 | Temp 97.8°F | Ht 64.0 in | Wt 193.2 lb

## 2022-04-05 DIAGNOSIS — E559 Vitamin D deficiency, unspecified: Secondary | ICD-10-CM | POA: Insufficient documentation

## 2022-04-05 DIAGNOSIS — Z780 Asymptomatic menopausal state: Secondary | ICD-10-CM

## 2022-04-05 DIAGNOSIS — R5383 Other fatigue: Secondary | ICD-10-CM

## 2022-04-05 DIAGNOSIS — M8580 Other specified disorders of bone density and structure, unspecified site: Secondary | ICD-10-CM

## 2022-04-05 DIAGNOSIS — G4731 Primary central sleep apnea: Secondary | ICD-10-CM

## 2022-04-05 DIAGNOSIS — J4520 Mild intermittent asthma, uncomplicated: Secondary | ICD-10-CM

## 2022-04-05 LAB — VITAMIN D 25 HYDROXY (VIT D DEFICIENCY, FRACTURES): Vit D, 25-Hydroxy: 27.85 ng/mL — ABNORMAL LOW (ref 30–100)

## 2022-04-05 LAB — T4, FREE: Free T4: 0.71 ng/dL (ref 0.61–1.12)

## 2022-04-05 LAB — TSH: TSH: 4.608 u[IU]/mL — ABNORMAL HIGH (ref 0.350–4.500)

## 2022-04-05 NOTE — Progress Notes (Signed)
Subjective:    Patient ID: Natasha Vasquez, female    DOB: 1940/09/29, 82 y.o.   MRN: [id number] Patient Care Team: Virginia Crews, MD as PCP - General (Family Medicine) Watt Climes, PA as Physician Assistant (Physician Assistant) Marry Guan, Laurice Record, MD as Consulting Physician (Orthopedic Surgery) Byrnett, Forest Gleason, MD (General Surgery) Dasher, Rayvon Char, MD (Dermatology) Cammie Sickle, MD as Consulting Physician (Internal Medicine) Leandrew Koyanagi, MD as Referring Physician (Ophthalmology) Tyler Pita, MD as Consulting Physician (Pulmonary Disease) Melrose Nakayama, MD as Consulting Physician (Cardiothoracic Surgery) Germaine Pomfret, [facility] as Pharmacist (Pharmacist)  Chief Complaint  Patient presents with   Follow-up    Breathing is the same. SOB with exertion. Occasional wheezing and dry cough.    HPI Natasha Vasquez is an 82 year old very remote former smoker with minimal tobacco history, who presents for follow-up on the issue of dyspnea on exertion she expresses now mostly as fatigue.  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  She was last seen here on 29 December 2021 this is a follow-up visit.  At her prior visit she was given a trial of Trelegy Ellipta for mild intermittent asthmatic bronchitis.  She notes that this has helped the issues with chest congestion and cough.  She has no dyspnea per se but she does note significant loss of vim,vigor and vitality.  She has significant stress as her husband has now been diagnosed with memory decline possible early Alzheimer's.  It has become more acclimatized to her AVAPS device.  She has very good compliance.  The compliance download does show a hiatus she took during a trip.  Her residual AHI when she uses the device is 1.4 which is excellent.  She does note that she is starting to sleep better with the device and she does feel better and more refreshed in the mornings.   She has not  had any issues with wheezing, chest congestion or cough since starting on the Trelegy.  She feels that this is helping her.   Has not had any fevers, chills or sweats.  No cough.  No hemoptysis.  No chest pain, no paroxysmal nocturnal dyspnea.  No edema or calf tenderness.  She does not endorse any other symptomatology today.  She is very discouraged because she has "no get up and go".  DATA: 04/03/2021 echocardiogram: LVEF 60 to 65%, no regional wall motion abnormalities.  Moderate LVH. Grade 1 DD.  RV function normal. 04/20/2021 PFTs: FEV1 1.95 L or 99% predicted, FVC 2.63 L or 102% predicted, FEV1/FVC 74%, no bronchodilator response.  Lung volumes normal with minimal air trapping.  Diffusion capacity mildly reduced.  Overall normal study. 05/04/2021 CT angio chest: Left upper lobe and lingular pneumonia, no pulmonary embolism 06/01/2021 CT coronary: Coronary score 0, no overt coronary findings on lung windows, small hiatal hernia. 08/18/2021 split-night sleep study: Consistent with severe obstructive sleep apnea and treatment emergent central sleep apnea, titration was not successful.  Eventually patient titrated to AVAPS. 06/01/2021 CT coronary: Normal coronary calcium score of 0, mild to moderately dilated ascending aorta enthesis known issue), no abnormalities on the pulmonary parenchyma visualized.  Small sliding hiatal hernia.  Review of Systems A 10 point review of systems was performed and it is as noted above otherwise negative.  Patient Active Problem List   Diagnosis Date Noted   Sciatic pain, left 09/27/2021   SOB (shortness of breath) 05/16/2021   Aneurysm of ascending aorta  without rupture ([place]) 05/10/2021   CAP (community acquired pneumonia) 05/04/2021   HLD (hyperlipidemia) 05/04/2021   Elevated troponin 05/04/2021   Chronic diastolic CHF (congestive heart failure) ([place]) 05/04/2021   Recurrent major depressive disorder, in partial remission ([place]) 03/24/2021   Gout  03/24/2021   PAD (peripheral artery disease) ([place]) 06/24/2020   Iron deficiency 10/07/2018   Primary osteoarthritis of left knee 09/17/2018   Emphysema lung ([place]) 03/14/2018   Diabetic peripheral neuropathy associated with type 2 diabetes mellitus ([place]) 03/14/2018   Metatarsalgia of both feet 03/14/2018   Abnormality of lung 03/14/2018   Acquired absence of right breast 06/11/2017   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[place]) 05/09/2017   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   Dysphagia 03/11/2017   Obesity 03/11/2017   Subclinical hypothyroidism 06/08/2015   Osteopenia 06/08/2015   Billowing mitral valve 06/08/2015   Melanoma in situ ([place]) 06/08/2015   Cramps of lower extremity 06/08/2015   Type 2 diabetes mellitus ([place]) 05/18/2015   Chronic diarrhea 05/18/2015   Anemia 05/18/2015   Clinical depression 01/26/2015   Hyperlipidemia associated with type 2 diabetes mellitus ([place]) 10/28/2014   Hypertension associated with diabetes ([place]) 09/27/2014   GERD (gastroesophageal reflux disease) 08/06/2014   Overactive bladder 07/16/2014   Social History   Tobacco Use   Smoking status: Former    Years: 2    Types: Cigarettes    Quit date: 1965    Years since quitting: 59.2   Smokeless tobacco: Never   Tobacco comments:    smoked in college--1 pack would last one week.  Substance Use Topics   Alcohol use: Yes    Alcohol/week: 7.0 - 14.0 standard drinks of alcohol    Types: 7 - 14 Glasses of wine per week    Comment: 1-2 glasses of wine per night   Allergies  Allergen Reactions   Codeine Other (See Comments)    Chest pain   Lorazepam Other (See Comments)    "Feels loopy"   Oxycodone Other (See Comments)    MENTAL CHANGES   Paregoric     Chest pain---tolerated MORPHINE   Penicillins Hives    Has patient had a PCN reaction causing immediate rash, facial/tongue/throat swelling, SOB or lightheadedness with hypotension: Yes Has patient  had a PCN reaction causing severe rash involving mucus membranes or skin necrosis: No Has patient had a PCN reaction that required hospitalization: NO Has patient had a PCN reaction occurring within the last 10 years: No If all of the above answers are "NO", then may proceed with Cephalosporin use.  Can take Augmentin   Quinolones     PATIENT UNAWARE OF allergy to this class of antibiotics, only aware of allergy to [facility] Patient was warned about not using Cipro and similar antibiotics. Recent studies have raised concern that fluoroquinolone antibiotics could be associated with an increased risk of aortic aneurysm Fluoroquinolones have non-antimicrobial properties that might jeopardise the integrity of the extracellular matrix of the vascular wall In a  propensity score matched cohort study in [place], there was a 66% increased rate   Aleve [Naproxen Sodium] Hives, Rash and Other (See Comments)    headaches   Current Meds  Medication Sig   acetaminophen (TYLENOL) 500 MG tablet Take 500 mg by mouth every 6 (six) hours as needed.   albuterol (VENTOLIN HFA) 108 (90 Base) MCG/ACT inhaler TAKE 2 PUFFS BY MOUTH EVERY 6 HOURS AS NEEDED FOR WHEEZE OR SHORTNESS OF BREATH  ARIPiprazole (ABILIFY) 2 MG tablet TAKE 1 TABLET BY MOUTH EVERY DAY   aspirin 81 MG tablet Take 81 mg by mouth daily.   bismuth subsalicylate (PEPTO BISMOL) 262 MG chewable tablet Chew 262 mg by mouth as needed. **capsule**   calcium carbonate (TUMS - DOSED IN MG ELEMENTAL CALCIUM) 500 MG chewable tablet Chew 1 tablet by mouth as needed for indigestion or heartburn.   carboxymethylcellulose (REFRESH PLUS) 0.5 % SOLN Place 1 drop into both eyes 3 (three) times daily as needed (Dry Eyes).   colestipol (COLESTID) 1 g tablet Take 1 g by mouth 2 (two) times daily.   famotidine (PEPCID) 20 MG tablet TAKE 1 TABLET BY MOUTH TWICE A DAY   ferrous sulfate 324 MG TBEC Take 324 mg by mouth daily with breakfast.   fluocinonide (LIDEX) 0.05 %  external solution APPLY TO AFFECTED AREAS OF SCALP DAILY UNTIL CLEAR, THEN FOR FLARES   Fluticasone-Umeclidin-Vilant (TRELEGY ELLIPTA) 100-62.5-25 MCG/ACT AEPB Inhale 1 puff into the lungs daily.   furosemide (LASIX) 20 MG tablet Take 1 tablet (20 mg total) by mouth daily.   lisinopril (ZESTRIL) 5 MG tablet Take 1 tablet (5 mg total) by mouth daily. Please schedule office visit before any future refills   metoprolol succinate (TOPROL XL) 25 MG 24 hr tablet Take 1 tablet (25 mg total) by mouth daily.   Multiple Vitamins-Minerals (CENTRUM SILVER PO) Take 1 tablet by mouth daily.   omeprazole (PRILOSEC) 40 MG capsule Take 1 capsule (40 mg total) by mouth 2 (two) times daily.   oxybutynin (DITROPAN-XL) 10 MG 24 hr tablet TAKE 1 TABLET BY MOUTH EVERY DAY   potassium chloride (KLOR-CON) 10 MEQ tablet Take 1 tablet (10 mEq total) by mouth daily.   rosuvastatin (CRESTOR) 10 MG tablet TAKE 1 TABLET BY MOUTH EVERY DAY   Venlafaxine HCl 225 MG TB24 Take 1 tablet (225 mg total) by mouth daily. Please schedule office visit before any future refills   Immunization History  Administered Date(s) Administered   Fluad Quad(high Dose 65+) 09/09/2018, 10/12/2019, 10/14/2020, 09/25/2021   Influenza Split 10/24/2011   Influenza, High Dose Seasonal PF 11/18/2013, 01/24/2016, 10/08/2016, 09/12/2017   Influenza-Unspecified 10/24/2011, 10/14/2012, 11/18/2013   Moderna Sars-Covid-2 Vaccination 01/30/2019, 03/02/2019   Pneumococcal Conjugate-13 08/21/2013   Pneumococcal Polysaccharide-23 01/24/2016   Respiratory Syncytial Virus Vaccine,Recomb Aduvanted(Arexvy) 11/11/2021   Tdap 10/24/2011   Zoster Recombinat (Shingrix) 04/01/2016, 08/14/2016       Objective:   Physical Exam BP 120/80 (BP Location: Left Arm, Cuff Size: Normal)   Pulse 71   Temp 97.8 F (36.6 C)   Ht 5\' 4"  (1.626 m)   Wt 193 lb 3.2 oz (87.6 kg)   SpO2 94%   BMI 33.16 kg/m   SpO2: 94 % O2 Device: None (Room air)  GENERAL: Overweight  woman, spry, age-appropriate, fully ambulatory, mild conversational dyspnea. HEAD: Normocephalic, atraumatic.  EYES: Pupils equal, round, reactive to light.  No scleral icterus.  MOUTH: Oral mucosa moist, no thrush. NECK: Supple. No thyromegaly. Trachea midline. No JVD.  No adenopathy.  Faint bilateral carotid bruits. PULMONARY: Good air entry bilaterally.  Faint end expiratory wheezes noted.Marland Kitchen CARDIOVASCULAR: S1 and S2. Regular rate and rhythm.  Has a grade 1/6 systolic ejection murmur in the mitral position. ABDOMEN: Slightly protuberant otherwise benign MUSCULOSKELETAL: No joint deformity, no clubbing, no edema.  NEUROLOGIC: No overt focal deficit, no gait disturbance, speech is fluent. SKIN: Intact,warm,dry. PSYCH: Mood and behavior normal.       Assessment & Plan:  ICD-10-CM   1. Mild intermittent asthmatic bronchitis without complication  [id number]    Continue Trelegy 100, 1 inhalation daily Continue as needed albuterol Well compensated    2. Complex sleep apnea syndrome  G47.31    Patient is currently on AVAPS Good compliance with device Patient notes more restorative sleep    3. Other fatigue  R53.83 T4, free    TSH    VITAMIN D 25 Hydroxy (Vit-D Deficiency, Fractures)    CANCELED: VITAMIN D 25 Hydroxy (Vit-D Deficiency, Fractures)    CANCELED: TSH    CANCELED: T4, free   Describes loss of vim,vigor and vitality Not dyspnea per se Check TSH/free T4    4. Osteopenia after menopause  M85.80 VITAMIN D 25 Hydroxy (Vit-D Deficiency, Fractures)   Z78.0 CANCELED: VITAMIN D 25 Hydroxy (Vit-D Deficiency, Fractures)   Vitamin D level     Orders Placed This Encounter  Procedures   T4, free    Standing Status:   Future    Number of Occurrences:   1    Standing Expiration Date:   04/05/2023   TSH    Standing Status:   Future    Number of Occurrences:   1    Standing Expiration Date:   04/05/2023   VITAMIN D 25 Hydroxy (Vit-D Deficiency, Fractures)    Standing Status:    Future    Number of Occurrences:   1    Standing Expiration Date:   04/05/2023   We will see the patient in follow-up in 4 to 6 months time she is to contact us prior to that time should any new difficulties arise.  We will notify her the results of her thyroid and vitamin D testing.  Renold Don, MD Advanced Bronchoscopy PCCM [facility]    *This note was dictated using voice recognition software/Dragon.  Despite best efforts to proofread, errors can occur which can change the meaning. Any transcriptional errors that result from this process are unintentional and may not be fully corrected at the time of dictation.

## 2022-04-05 NOTE — Patient Instructions (Signed)
We have ordered some blood work today for you to get at the lab.  Continue using your AVAPS machine.  We will see you in follow-up in 4 to 6 months time call sooner should any new problems arise.  We will call you with the results of your blood work once these are known.

## 2022-04-06 ENCOUNTER — Telehealth: Payer: Self-pay | Admitting: Family Medicine

## 2022-04-06 DIAGNOSIS — R7989 Other specified abnormal findings of blood chemistry: Secondary | ICD-10-CM

## 2022-04-06 NOTE — Telephone Encounter (Signed)
Pt is calling in because she was told by "Dr. Patsey Berthold" that pt's vitamin d is low and their thyroid is declining. Please follow up with pt.

## 2022-04-06 NOTE — Addendum Note (Signed)
Addended by: Doristine Devoid on: 04/06/2022 04:04 PM   Modules accepted: Orders

## 2022-04-06 NOTE — Telephone Encounter (Signed)
Vitamin D is mildly low, would recommend starting over the counter vitamin D3 supplementation   TSH is mildly elevated, normal level is 4.5, patient's is 4.6 this year compared to last.   Would recommend rechecking this level in 6 weeks and following up with Dr. Brita Romp.   Natasha Foster, MD  [facility]

## 2022-04-06 NOTE — Telephone Encounter (Signed)
Please Review for Dr. B ?

## 2022-04-06 NOTE — Telephone Encounter (Signed)
Patient advised.

## 2022-04-07 ENCOUNTER — Encounter: Payer: Self-pay | Admitting: Pulmonary Disease

## 2022-04-23 ENCOUNTER — Ambulatory Visit (INDEPENDENT_AMBULATORY_CARE_PROVIDER_SITE_OTHER): Payer: Medicare PPO

## 2022-04-23 VITALS — Ht 65.0 in | Wt 193.0 lb

## 2022-04-23 DIAGNOSIS — Z0000 Encounter for general adult medical examination without abnormal findings: Secondary | ICD-10-CM | POA: Diagnosis not present

## 2022-04-23 NOTE — Progress Notes (Signed)
I connected with  [name] on 04/23/22 by a audio enabled telemedicine application and verified that I am speaking with the correct person using two identifiers.  Patient Location: Home  Provider Location: Office/Clinic  I discussed the limitations of evaluation and management by telemedicine. The patient expressed understanding and agreed to proceed.  Subjective:   Natasha Vasquez is a 82 y.o. female who presents for Medicare Annual (Subsequent) preventive examination.  Review of Systems    Cardiac Risk Factors include: advanced age (>44men, >30 women);diabetes mellitus;hypertension;obesity (BMI >30kg/m2)    Objective:    Today's Vitals   04/23/22 1402  Weight: 193 lb (87.5 kg)  Height: 5\' 5"  (1.651 m)   Body mass index is 32.12 kg/m.     04/23/2022    2:12 PM 05/04/2021   11:52 AM 03/21/2021    1:30 PM 03/03/2021    1:02 PM 09/07/2020   10:16 AM 03/17/2020    1:44 PM 07/13/2019   10:59 PM  Advanced Directives  Does Patient Have a Medical Advance Directive? Yes No Yes Yes No;Yes Yes No  Type of Advance Directive   Living will Living will;Healthcare Power of Attorney Living will Healthcare Power of [place];Living will   Does patient want to make changes to medical advance directive?   Yes (Inpatient - patient defers changing a medical advance directive and declines information at this time)      Copy of Healthcare Power of Attorney in Chart?      Yes - validated most recent copy scanned in chart (See row information)   Would patient like information on creating a medical advance directive?  No - Patient declined     No - Patient declined    Current Medications (verified) Outpatient Encounter Medications as of 04/23/2022  Medication Sig   acetaminophen (TYLENOL) 500 MG tablet Take 500 mg by mouth every 6 (six) hours as needed.   albuterol (VENTOLIN HFA) 108 (90 Base) MCG/ACT inhaler TAKE 2 PUFFS BY MOUTH EVERY 6 HOURS AS NEEDED FOR WHEEZE OR SHORTNESS OF BREATH    allopurinol 200 MG TABS Take 200 mg by mouth daily.   ARIPiprazole (ABILIFY) 2 MG tablet TAKE 1 TABLET BY MOUTH EVERY DAY   aspirin 81 MG tablet Take 81 mg by mouth daily.   bismuth subsalicylate (PEPTO BISMOL) 262 MG chewable tablet Chew 262 mg by mouth as needed. **capsule**   calcium carbonate (TUMS - DOSED IN MG ELEMENTAL CALCIUM) 500 MG chewable tablet Chew 1 tablet by mouth as needed for indigestion or heartburn.   carboxymethylcellulose (REFRESH PLUS) 0.5 % SOLN Place 1 drop into both eyes 3 (three) times daily as needed (Dry Eyes).   colestipol (COLESTID) 1 g tablet Take 1 g by mouth 2 (two) times daily.   famotidine (PEPCID) 20 MG tablet TAKE 1 TABLET BY MOUTH TWICE A DAY   ferrous sulfate 324 MG TBEC Take 324 mg by mouth daily with breakfast.   fluocinonide (LIDEX) 0.05 % external solution APPLY TO AFFECTED AREAS OF SCALP DAILY UNTIL CLEAR, THEN FOR FLARES   Fluticasone-Umeclidin-Vilant (TRELEGY ELLIPTA) 100-62.5-25 MCG/ACT AEPB Inhale 1 puff into the lungs daily.   furosemide (LASIX) 20 MG tablet Take 1 tablet (20 mg total) by mouth daily.   lisinopril (ZESTRIL) 5 MG tablet Take 1 tablet (5 mg total) by mouth daily. Please schedule office visit before any future refills   metoprolol succinate (TOPROL XL) 25 MG 24 hr tablet Take 1 tablet (25 mg total) by mouth daily.  Multiple Vitamins-Minerals (CENTRUM SILVER PO) Take 1 tablet by mouth daily.   omeprazole (PRILOSEC) 40 MG capsule Take 1 capsule (40 mg total) by mouth 2 (two) times daily.   oxybutynin (DITROPAN-XL) 10 MG 24 hr tablet TAKE 1 TABLET BY MOUTH EVERY DAY   potassium chloride (KLOR-CON) 10 MEQ tablet Take 1 tablet (10 mEq total) by mouth daily.   rosuvastatin (CRESTOR) 10 MG tablet TAKE 1 TABLET BY MOUTH EVERY DAY   Venlafaxine HCl 225 MG TB24 Take 1 tablet (225 mg total) by mouth daily. Please schedule office visit before any future refills   No facility-administered encounter medications on file as of 04/23/2022.     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  ER/PR NEGATIVE   Cataract    Chronic cough    Complication of anesthesia    20 years ago pts b/p dropped really low, "coded"   Depression    Depression    Diabetes mellitus without complication    diet controlled   Family history of adverse reaction to anesthesia    son PONV   GERD (gastroesophageal reflux disease)    Gout    Hypercholesteremia    Hyperlipidemia    Hypertension    Melanoma    MVP (mitral valve prolapse)    followed by PCP   Nephritis    Nephritis    S/P appendectomy    Thoracic aortic aneurysm    Torn meniscus    left   Past Surgical History:  Procedure Laterality Date   ABDOMINAL HYSTERECTOMY     APPENDECTOMY     BREAST BIOPSY Right 04/25/2017   retroareolar 10:00   wing clip   path pending   BREAST BIOPSY Right 04/25/2017   10:00  5CMFN  venus clip    path pending   BREAST BIOPSY Right 05/03/2017   Affirm Bx- path pending   BREAST CYST ASPIRATION Bilateral    NEG   BREAST EXCISIONAL BIOPSY Left 20+ yrs ago   NEG   BREAST RECONSTRUCTION WITH PLACEMENT OF TISSUE EXPANDER AND FLEX HD (ACELLULAR HYDRATED DERMIS) Right 06/03/2017   Procedure: BREAST RECONSTRUCTION WITH PLACEMENT OF TISSUE EXPANDER AND FLEX HD (ACELLULAR HYDRATED DERMIS);  Surgeon: Peggye Form, DO;  Location: [facility];  Service: Plastics;  Laterality: Right;   BREAST REDUCTION SURGERY Left 11/06/2017   Procedure: BREAST REDUCTION;  Surgeon: Peggye Form, DO;  Location: [facility];  Service: Plastics;  Laterality: Left;   BROW LIFT Bilateral 02/01/2015   Procedure: BLEPHAROPLASTY bilateral upper eyelids.;  Surgeon: Imagene Riches, MD;  Location:  [facility];  Service: Ophthalmology;  Laterality: Bilateral;  DIABETIC - diet controlled   CATARACT EXTRACTION W/PHACO Left 01/14/2019   Procedure: CATARACT EXTRACTION PHACO AND INTRAOCULAR LENS PLACEMENT (IOC) LEFT TECNIS TORIC ADD 5.62 00:46.5 30.0%;  Surgeon: Lockie Mola, MD;  Location: [facility];  Service: Ophthalmology;  Laterality: Left;  diabetic - diet controlled   CATARACT EXTRACTION W/PHACO Right 02/11/2019   Procedure: CATARACT EXTRACTION PHACO AND INTRAOCULAR LENS PLACEMENT (IOC) RIGHT DIABETIC 4.82 00:46.9 10.3%;  Surgeon: Lockie Mola, MD;  Location: [facility];  Service: Ophthalmology;  Laterality: Right;  Diabetic - diet controlled   CHOLECYSTECTOMY  COLONOSCOPY     COLONOSCOPY WITH PROPOFOL N/A 05/11/2015   Procedure: COLONOSCOPY WITH PROPOFOL;  Surgeon: Scot Jun, MD;  Location: [facility];  Service: Endoscopy;  Laterality: N/A;   COLONOSCOPY WITH PROPOFOL N/A 09/07/2020   Procedure: COLONOSCOPY WITH PROPOFOL;  Surgeon: Toledo, Boykin Nearing, MD;  Location: [facility] ENDOSCOPY;  Service: Gastroenterology;  Laterality: N/A;   ESOPHAGOGASTRODUODENOSCOPY     ESOPHAGOGASTRODUODENOSCOPY (EGD) WITH PROPOFOL  05/11/2015   Procedure: ESOPHAGOGASTRODUODENOSCOPY (EGD) WITH PROPOFOL;  Surgeon: Scot Jun, MD;  Location: [facility] ENDOSCOPY;  Service: Endoscopy;;   FRACTURE SURGERY Right    arm and shoulder   JOINT REPLACEMENT     KNEE ARTHROSCOPY Left 03/14/2016   Procedure: ARTHROSCOPY KNEE, PARTIAL MEDIAL MENISECTOMY, CHONDROPLASTY;  Surgeon: Donato Heinz, MD;  Location: [facility];  Service: Orthopedics;  Laterality: Left;   MASTECTOMY Right 2019   MASTECTOMY W/ SENTINEL NODE BIOPSY Right 06/03/2017   Procedure: MASTECTOMY WITH SENTINEL LYMPH NODE BIOPSY;  Surgeon: Earline Mayotte, MD;  Location: [facility];  Service: General;  Laterality: Right;   PARTIAL HIP ARTHROPLASTY Right    REDUCTION MAMMAPLASTY Left 2019   REMOVAL OF BILATERAL  TISSUE EXPANDERS WITH PLACEMENT OF BILATERAL BREAST IMPLANTS Right 11/06/2017   Procedure: REMOVAL OF TISSUE EXPANDER WITH PLACEMENT OF BREAST IMPLANT;  Surgeon: Peggye Form, DO;  Location: [facility];  Service: Plastics;  Laterality: Right;   TONSILLECTOMY     Family History  Problem Relation Age of Onset   Cancer Mother        colon/rectal   Colon cancer Mother    Heart disease Father    Hypertension Father    Atrial fibrillation Father    Diabetes Father    Cancer Brother        lung   Healthy Son    Healthy Son    Breast cancer Neg Hx    Social History   Socioeconomic History   Marital status: Married    Spouse name: Dorene Sorrow   Number of children: 2   Years of education: Not on file   Highest education level: Master's degree (e.g., MA, MS, MEng, MEd, MSW, MBA)  Occupational History   Occupation: retired  Tobacco Use   Smoking status: Former    Years: 2    Types: Cigarettes    Quit date: 1965    Years since quitting: 59.3   Smokeless tobacco: Never   Tobacco comments:    smoked in college--1 pack would last one week.  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    Social Determinants of Health   Financial Resource Strain: Low Risk  (04/23/2022)   Overall Financial Resource Strain (CARDIA)    Difficulty of Paying Living Expenses: Not hard at all  Food Insecurity: No Food Insecurity (04/23/2022)   Hunger Vital Sign    Worried About Running Out of Food in the Last Year: Never true    Ran Out of Food in the Last Year: Never true  Transportation Needs: No Transportation Needs (04/23/2022)  PRAPARE - Administrator, Civil Service (Medical): No    Lack of Transportation (Non-Medical): No  Physical Activity: Sufficiently Active (04/23/2022)   Exercise Vital Sign    Days of Exercise per Week: 5 days    Minutes of Exercise per Session: 40 min  Stress: No Stress Concern Present (04/23/2022)   [employer] of Occupational Health - Occupational Stress Questionnaire    Feeling of Stress : Not at all  Social Connections: Socially Integrated (04/23/2022)   Social Connection and Isolation Panel [NHANES]    Frequency of Communication with Friends and Family: More than three times a week    Frequency of Social Gatherings with Friends and Family: More than three times a week    Attends Religious Services: More than 4 times per year    Active Member of [employer] or Organizations: Yes    Attends Engineer, structural: More than 4 times per year    Marital Status: Married    Tobacco Counseling Counseling given: Not Answered Tobacco comments: smoked in college--1 pack would last one week.   Clinical Intake:  Pre-visit preparation completed: Yes  Pain : No/denies pain   BMI - recorded: 32.12 Nutritional Status: BMI > 30  Obese Nutritional Risks: None Diabetes: Yes CBG done?: No Did pt. bring in CBG monitor from home?: No  How often do you need to have someone help you when you read instructions, pamphlets, or other written materials from your doctor or pharmacy?: 1 - Never  Diabetic?YES  Interpreter Needed?: No  Comments: lives w/husband Information entered by :: B.Aimar Shrewsbury,LPN  Activities of Daily Living    04/23/2022    2:17 PM 09/25/2021    1:37 PM  In your present state of health, do you have any difficulty performing the following activities:  Hearing? 1 0  Vision? 0 0  Difficulty concentrating or making decisions? 0 0  Walking or climbing stairs? 1 0  Dressing or bathing? 0 0  Doing errands, shopping? 0 0  Preparing Food and eating ? N    Using the Toilet? N   In the past six months, have you accidently leaked urine? Y   Do you have problems with loss of bowel control? N   Managing your Medications? N   Managing your Finances? N   Housekeeping or managing your Housekeeping? N     Patient Care Team: Erasmo Downer, MD as PCP - General (Family Medicine) Tera Partridge, PA as Physician Assistant (Physician Assistant) Ernest Pine, Illene Labrador, MD as Consulting Physician (Orthopedic Surgery) Lemar Livings, Merrily Pew, MD (General Surgery) Dasher, Cliffton Asters, MD (Dermatology) Earna Coder, MD as Consulting Physician (Internal Medicine) Lockie Mola, MD as Referring Physician (Ophthalmology) Salena Saner, MD as Consulting Physician (Pulmonary Disease) Loreli Slot, MD as Consulting Physician (Cardiothoracic Surgery) Gaspar Cola, [facility] as Pharmacist (Pharmacist)  Indicate any recent Medical Services you may have received from other than [facility] providers in the past year (date may be approximate).     Assessment:   This is a routine wellness examination for Malachi.  Hearing/Vision screen Hearing Screening - Comments:: Adequate hearing:hearing test this month  Vision Screening - Comments:: Adequate vision w/glasses [facility]  Dietary issues and exercise activities discussed: Current Exercise Habits: Structured exercise class;Home exercise routine, Type of exercise: stretching;walking, Time (Minutes): 45, Frequency (Times/Week): 5, Weekly Exercise (Minutes/Week): 225, Intensity: Mild, Exercise limited by: orthopedic condition(s)   Goals Addressed  This Visit's Progress    DIET - EAT MORE FRUITS AND VEGETABLES   On track    DIET - INCREASE WATER INTAKE   On track    Recommend to increase water intake to 6-8 8 oz glasses a day.      Exercise 3x per week (30 min per time)   On track    Recommend to exercise for 3 days a week for at least 30 minutes at a time.         Depression  Screen    04/23/2022    2:10 PM 09/25/2021    1:37 PM 05/16/2021   11:15 AM 03/21/2021    1:25 PM 11/04/2020    2:30 PM 03/17/2020    2:15 PM 03/17/2020    1:43 PM  PHQ 2/9 Scores  PHQ - 2 Score 0 0 2 0 0 0 0  PHQ- 9 Score  0 Fall Risk    04/23/2022    2:05 PM 09/25/2021    1:24 PM 03/21/2021    1:32 PM 11/04/2020    2:29 PM 03/17/2020    1:43 PM  Fall Risk   Falls in the past year? 0 0 1 1 0  Number falls in past yr: 0 0 0 0 0  Injury with Fall? 0 0 1 0 0  Risk for fall due to : No Fall Risks No Fall Risks History of fall(s) No Fall Risks   Follow up Education provided;Falls prevention discussed Falls evaluation completed Falls prevention discussed Falls evaluation completed     FALL RISK PREVENTION PERTAINING TO THE HOME:  Any stairs in or around the home? Yes  If so, are there any without handrails? Yes  Home free of loose throw rugs in walkways, pet beds, electrical cords, etc? Yes Adequate lighting in your home to reduce risk of falls? Yes   ASSISTIVE DEVICES UTILIZED TO PREVENT FALLS:  Life alert? No  Use of a cane, walker or w/c? No  Grab bars in the bathroom? Yes  Shower chair or bench in shower? Yes  Elevated toilet seat or a handicapped toilet? Yes    Cognitive Function:        04/23/2022    2:21 PM 03/12/2018    2:24 PM 03/11/2017    9:40 AM  6CIT Screen  What Year? 0 points 0 points 0 points  What month? 0 points 0 points 0 points  What time? 0 points 0 points 0 points  Count back from 20 0 points 0 points 0 points  Months in reverse 0 points 2 points 0 points  Repeat phrase 0 points 0 points 0 points  Total Score 0 points 2 points 0 points    Immunizations Immunization History  Administered Date(s) Administered   Fluad Quad(high Dose 65+) 09/09/2018, 10/12/2019, 10/14/2020, 09/25/2021   Influenza Split 10/24/2011   Influenza, High Dose Seasonal PF 11/18/2013, 01/24/2016, 10/08/2016, 09/12/2017   Influenza-Unspecified 10/24/2011, 10/14/2012,  11/18/2013   Moderna Sars-Covid-2 Vaccination 01/30/2019, 03/02/2019   Pneumococcal Conjugate-13 08/21/2013   Pneumococcal Polysaccharide-23 01/24/2016   Respiratory Syncytial Virus Vaccine,Recomb Aduvanted(Arexvy) 11/11/2021   Tdap 10/24/2011   Zoster Recombinat (Shingrix) 04/01/2016, 08/14/2016    TDAP status: Up to date  Flu Vaccine status: Up to date  Pneumococcal vaccine status: Up to date  Covid-19 vaccine status: Completed vaccines  Qualifies for Shingles Vaccine? Yes   Zostavax completed No   Shingrix Completed?: No.    Education has been provided  regarding the importance of this vaccine. Patient has been advised to call insurance company to determine out of pocket expense if they have not yet received this vaccine. Advised may also receive vaccine at local pharmacy or Health Dept. Verbalized acceptance and understanding.  Screening Tests Health Maintenance  Topic Date Due   COVID-19 Vaccine (3 - Moderna risk series) 03/30/2019   FOOT EXAM  03/17/2021   DTaP/Tdap/Td (2 - Td or Tdap) 10/23/2021   Diabetic kidney evaluation - Urine ACR  03/29/2022   HEMOGLOBIN A1C  03/26/2022   Diabetic kidney evaluation - eGFR measurement  05/09/2022   INFLUENZA VACCINE  08/16/2022   OPHTHALMOLOGY EXAM  11/22/2022   Medicare Annual Wellness (AWV)  04/23/2023   DEXA SCAN  05/03/2025   Pneumonia Vaccine 6565+ Years old  Completed   Zoster Vaccines- Shingrix  Completed   HPV VACCINES  Aged Out    Health Maintenance  Health Maintenance Due  Topic Date Due   COVID-19 Vaccine (3 - Moderna risk series) 03/30/2019   FOOT EXAM  03/17/2021   DTaP/Tdap/Td (2 - Td or Tdap) 10/23/2021   Diabetic kidney evaluation - Urine ACR  03/29/2022   HEMOGLOBIN A1C  03/26/2022   Diabetic kidney evaluation - eGFR measurement  05/09/2022    Colorectal cancer screening: Type of screening: Colonoscopy. Completed yes. Repeat every 5 years pt AGED out  Mammogram status: No longer required due to  age.  Bone Density status: Completed yes. Results reflect: Bone density results: OSTEOPENIA. Repeat every 3 years. 2027  Lung Cancer Screening: (Low Dose CT Chest recommended if Age 9-80 years, 30 pack-year currently smoking OR have quit w/in 15years.) does not qualify.   Lung Cancer Screening Referral: no o Additional Screening:  Hepatitis C Screening: does not qualify; Completed yes  Vision Screening: Recommended annual ophthalmology exams for early detection of glaucoma and other disorders of the eye. Is the patient up to date with their annual eye exam?  Yes  Who is the provider or what is the name of the office in which the patient attends annual eye exams? [facility] If pt is not established with a provider, would they like to be referred to a provider to establish care? No .   Dental Screening: Recommended annual dental exams for proper oral hygiene  Community Resource Referral / Chronic Care Management: CRR required this visit?  No   CCM required this visit?  No      Plan:     I have personally reviewed and noted the following in the patient's chart:   Medical and social history Use of alcohol, tobacco or illicit drugs  Current medications and supplements including opioid prescriptions. Patient is not currently taking opioid prescriptions. Functional ability and status Nutritional status Physical activity Advanced directives List of other physicians Hospitalizations, surgeries, and ER visits in previous 12 months Vitals Screenings to include cognitive, depression, and falls Referrals and appointments  In addition, I have reviewed and discussed with patient certain preventive protocols, quality metrics, and best practice recommendations. A written personalized care plan for preventive services as well as general preventive health recommendations were provided to patient.     Sue LushBrenda L Adolfo Granieri, LPN   9/1/47824/08/2022   Nurse Notes: pt sts she is at a [employer]  party and doing alright other than her knees hurting. Pt relays she wants her PCP to know she is having more pain in her knees. No other concerns or questions.

## 2022-04-23 NOTE — Patient Instructions (Addendum)
Natasha Vasquez , Thank you for taking time to come for your Medicare Wellness Visit. I appreciate your ongoing commitment to your health goals. Please review the following plan we discussed and let me know if I can assist you in the future.   These are the goals we discussed:  Goals      DIET - EAT MORE FRUITS AND VEGETABLES     DIET - INCREASE WATER INTAKE     Recommend to increase water intake to 6-8 8 oz glasses a day.      Exercise 3x per week (30 min per time)     Recommend to exercise for 3 days a week for at least 30 minutes at a time.          This is a list of the screening recommended for you and due dates:  Health Maintenance  Topic Date Due   COVID-19 Vaccine (3 - Moderna risk series) 03/30/2019   Complete foot exam   03/17/2021   DTaP/Tdap/Td vaccine (2 - Td or Tdap) 10/23/2021   Yearly kidney health urinalysis for diabetes  03/29/2022   Hemoglobin A1C  03/26/2022   Yearly kidney function blood test for diabetes  05/09/2022   Flu Shot  08/16/2022   Eye exam for diabetics  11/22/2022   Medicare Annual Wellness Visit  04/23/2023   DEXA scan (bone density measurement)  05/03/2025   Pneumonia Vaccine  Completed   Zoster (Shingles) Vaccine  Completed   HPV Vaccine  Aged Out    Advanced directives: yes  Conditions/risks identified: none  Next appointment: Follow up in one year for your annual wellness visit 04/29/2023 @1 :30 pm telephone   Preventive Care 65 Years and Older, Female Preventive care refers to lifestyle choices and visits with your health care provider that can promote health and wellness. What does preventive care include? A yearly physical exam. This is also called an annual well check. Dental exams once or twice a year. Routine eye exams. Ask your health care provider how often you should have your eyes checked. Personal lifestyle choices, including: Daily care of your teeth and gums. Regular physical activity. Eating a healthy diet. Avoiding  tobacco and drug use. Limiting alcohol use. Practicing safe sex. Taking low-dose aspirin every day. Taking vitamin and mineral supplements as recommended by your health care provider. What happens during an annual well check? The services and screenings done by your health care provider during your annual well check will depend on your age, overall health, lifestyle risk factors, and family history of disease. Counseling  Your health care provider may ask you questions about your: Alcohol use. Tobacco use. Drug use. Emotional well-being. Home and relationship well-being. Sexual activity. Eating habits. History of falls. Memory and ability to understand (cognition). Work and work Astronomer. Reproductive health. Screening  You may have the following tests or measurements: Height, weight, and BMI. Blood pressure. Lipid and cholesterol levels. These may be checked every 5 years, or more frequently if you are over 53 years old. Skin check. Lung cancer screening. You may have this screening every year starting at age 27 if you have a 30-pack-year history of smoking and currently smoke or have quit within the past 15 years. Fecal occult blood test (FOBT) of the stool. You may have this test every year starting at age 24. Flexible sigmoidoscopy or colonoscopy. You may have a sigmoidoscopy every 5 years or a colonoscopy every 10 years starting at age 55. Hepatitis C blood test. Hepatitis B blood  test. Sexually transmitted disease (STD) testing. Diabetes screening. This is done by checking your blood sugar (glucose) after you have not eaten for a while (fasting). You may have this done every 1-3 years. Bone density scan. This is done to screen for osteoporosis. You may have this done starting at age 21. Mammogram. This may be done every 1-2 years. Talk to your health care provider about how often you should have regular mammograms. Talk with your health care provider about your test  results, treatment options, and if necessary, the need for more tests. Vaccines  Your health care provider may recommend certain vaccines, such as: Influenza vaccine. This is recommended every year. Tetanus, diphtheria, and acellular pertussis (Tdap, Td) vaccine. You may need a Td booster every 10 years. Zoster vaccine. You may need this after age 63. Pneumococcal 13-valent conjugate (PCV13) vaccine. One dose is recommended after age 62. Pneumococcal polysaccharide (PPSV23) vaccine. One dose is recommended after age 40. Talk to your health care provider about which screenings and vaccines you need and how often you need them. This information is not intended to replace advice given to you by your health care provider. Make sure you discuss any questions you have with your health care provider. Document Released: 01/28/2015 Document Revised: 09/21/2015 Document Reviewed: 11/02/2014 Elsevier Interactive Patient Education  2017 [place] Prevention in the Home Falls can cause injuries. They can happen to people of all ages. There are many things you can do to make your home safe and to help prevent falls. What can I do on the outside of my home? Regularly fix the edges of walkways and driveways and fix any cracks. Remove anything that might make you trip as you walk through a door, such as a raised step or threshold. Trim any bushes or trees on the path to your home. Use bright outdoor lighting. Clear any walking paths of anything that might make someone trip, such as rocks or tools. Regularly check to see if handrails are loose or broken. Make sure that both sides of any steps have handrails. Any raised decks and porches should have guardrails on the edges. Have any leaves, snow, or ice cleared regularly. Use sand or salt on walking paths during winter. Clean up any spills in your garage right away. This includes oil or grease spills. What can I do in the bathroom? Use night  lights. Install grab bars by the toilet and in the tub and shower. Do not use towel bars as grab bars. Use non-skid mats or decals in the tub or shower. If you need to sit down in the shower, use a plastic, non-slip stool. Keep the floor dry. Clean up any water that spills on the floor as soon as it happens. Remove soap buildup in the tub or shower regularly. Attach bath mats securely with double-sided non-slip rug tape. Do not have throw rugs and other things on the floor that can make you trip. What can I do in the bedroom? Use night lights. Make sure that you have a light by your bed that is easy to reach. Do not use any sheets or blankets that are too big for your bed. They should not hang down onto the floor. Have a firm chair that has side arms. You can use this for support while you get dressed. Do not have throw rugs and other things on the floor that can make you trip. What can I do in the kitchen? Clean up any spills right  away. Avoid walking on wet floors. Keep items that you use a lot in easy-to-reach places. If you need to reach something above you, use a strong step stool that has a grab bar. Keep electrical cords out of the way. Do not use floor polish or wax that makes floors slippery. If you must use wax, use non-skid floor wax. Do not have throw rugs and other things on the floor that can make you trip. What can I do with my stairs? Do not leave any items on the stairs. Make sure that there are handrails on both sides of the stairs and use them. Fix handrails that are broken or loose. Make sure that handrails are as long as the stairways. Check any carpeting to make sure that it is firmly attached to the stairs. Fix any carpet that is loose or worn. Avoid having throw rugs at the top or bottom of the stairs. If you do have throw rugs, attach them to the floor with carpet tape. Make sure that you have a light switch at the top of the stairs and the bottom of the stairs. If  you do not have them, ask someone to add them for you. What else can I do to help prevent falls? Wear shoes that: Do not have high heels. Have rubber bottoms. Are comfortable and fit you well. Are closed at the toe. Do not wear sandals. If you use a stepladder: Make sure that it is fully opened. Do not climb a closed stepladder. Make sure that both sides of the stepladder are locked into place. Ask someone to hold it for you, if possible. Clearly mark and make sure that you can see: Any grab bars or handrails. First and last steps. Where the edge of each step is. Use tools that help you move around (mobility aids) if they are needed. These include: Canes. Walkers. Scooters. Crutches. Turn on the lights when you go into a dark area. Replace any light bulbs as soon as they burn out. Set up your furniture so you have a clear path. Avoid moving your furniture around. If any of your floors are uneven, fix them. If there are any pets around you, be aware of where they are. Review your medicines with your doctor. Some medicines can make you feel dizzy. This can increase your chance of falling. Ask your doctor what other things that you can do to help prevent falls. This information is not intended to replace advice given to you by your health care provider. Make sure you discuss any questions you have with your health care provider. Document Released: 10/28/2008 Document Revised: 06/09/2015 Document Reviewed: 02/05/2014 Elsevier Interactive Patient Education  2017 [employer].

## 2022-04-27 DIAGNOSIS — G4733 Obstructive sleep apnea (adult) (pediatric): Secondary | ICD-10-CM | POA: Diagnosis not present

## 2022-05-01 ENCOUNTER — Ambulatory Visit: Payer: Medicare PPO | Admitting: Family Medicine

## 2022-05-01 ENCOUNTER — Ambulatory Visit: Payer: Self-pay

## 2022-05-01 ENCOUNTER — Encounter: Payer: Self-pay | Admitting: Family Medicine

## 2022-05-01 VITALS — BP 127/86 | HR 73 | Temp 97.7°F | Resp 20 | Wt 194.9 lb

## 2022-05-01 DIAGNOSIS — E1159 Type 2 diabetes mellitus with other circulatory complications: Secondary | ICD-10-CM

## 2022-05-01 DIAGNOSIS — R0602 Shortness of breath: Secondary | ICD-10-CM

## 2022-05-01 DIAGNOSIS — I10 Essential (primary) hypertension: Secondary | ICD-10-CM | POA: Diagnosis not present

## 2022-05-01 DIAGNOSIS — E038 Other specified hypothyroidism: Secondary | ICD-10-CM

## 2022-05-01 DIAGNOSIS — I152 Hypertension secondary to endocrine disorders: Secondary | ICD-10-CM | POA: Diagnosis not present

## 2022-05-01 MED ORDER — LISINOPRIL 10 MG PO TABS: 10.0000 mg | ORAL_TABLET | Freq: Every day | ORAL | 1 refills | Status: DC

## 2022-05-01 NOTE — Assessment & Plan Note (Addendum)
Fairly well controlled in the office today but seeing quite high BPs at home  Will cautiously increase her antiHTN meds Needs good BP control in setting of aortic aneurysm Continue toprol XL at current dose Increase lisinopril  daily F/u in 2 weks Recheck metabolic panel at that visit

## 2022-05-01 NOTE — Patient Instructions (Addendum)
Increase lisinopril to 10 mg daily (2 of the 5mg  pills) No other med changes  Bring BP log and BP cuff to next appt

## 2022-05-01 NOTE — Assessment & Plan Note (Signed)
Plan to recheck TSH and free T4 at next visit

## 2022-05-01 NOTE — Assessment & Plan Note (Signed)
Chronic and not improving Discussed the many contributing factors Encouraged to f/u with Pulm and continue to treat severe OSA as istructed

## 2022-05-01 NOTE — Progress Notes (Signed)
I,Sulibeya S Dimas,acting as a Neurosurgeon for Shirlee Latch, MD.,have documented all relevant documentation on the behalf of Shirlee Latch, MD,as directed by  Shirlee Latch, MD while in the presence of Shirlee Latch, MD.    Established patient visit   Patient: Natasha Vasquez   DOB: Apr 30, 1940   82 y.o. Female  MRN: [id number] Visit Date: 05/01/2022  Today's healthcare provider: Shirlee Latch, MD   No chief complaint on file.  Subjective    HPI  Hypertension: Patient here for follow-up of elevated blood pressure. She is not adherent to low salt diet.  Blood pressure is not well controlled at home. Cardiac symptoms dyspnea, fatigue, and lower extremity edema. Patient denies chest pain, exertional chest pressure/discomfort, and irregular heart beat.    Feels fine Home BP cuff shows 200s systolic BP intermittently Missed pills for last 2 days - took this AM  Medications: Outpatient Medications Prior to Visit  Medication Sig   acetaminophen (TYLENOL) 500 MG tablet Take 500 mg by mouth every 6 (six) hours as needed.   albuterol (VENTOLIN HFA) 108 (90 Base) MCG/ACT inhaler TAKE 2 PUFFS BY MOUTH EVERY 6 HOURS AS NEEDED FOR WHEEZE OR SHORTNESS OF BREATH   allopurinol 200 MG TABS Take 200 mg by mouth daily.   ARIPiprazole (ABILIFY) 2 MG tablet TAKE 1 TABLET BY MOUTH EVERY DAY   aspirin 81 MG tablet Take 81 mg by mouth daily.   bismuth subsalicylate (PEPTO BISMOL) 262 MG chewable tablet Chew 262 mg by mouth as needed. **capsule**   calcium carbonate (TUMS - DOSED IN MG ELEMENTAL CALCIUM) 500 MG chewable tablet Chew 1 tablet by mouth as needed for indigestion or heartburn.   carboxymethylcellulose (REFRESH PLUS) 0.5 % SOLN Place 1 drop into both eyes 3 (three) times daily as needed (Dry Eyes).   colestipol (COLESTID) 1 g tablet Take 1 g by mouth 2 (two) times daily.   famotidine (PEPCID) 20 MG tablet TAKE 1 TABLET BY MOUTH TWICE A DAY   ferrous sulfate 324 MG TBEC Take 324  mg by mouth daily with breakfast.   fluocinonide (LIDEX) 0.05 % external solution APPLY TO AFFECTED AREAS OF SCALP DAILY UNTIL CLEAR, THEN FOR FLARES   Fluticasone-Umeclidin-Vilant (TRELEGY ELLIPTA) 100-62.5-25 MCG/ACT AEPB Inhale 1 puff into the lungs daily.   furosemide (LASIX) 20 MG tablet Take 1 tablet (20 mg total) by mouth daily.   metoprolol succinate (TOPROL XL) 25 MG 24 hr tablet Take 1 tablet (25 mg total) by mouth daily.   Multiple Vitamins-Minerals (CENTRUM SILVER PO) Take 1 tablet by mouth daily.   omeprazole (PRILOSEC) 40 MG capsule Take 1 capsule (40 mg total) by mouth 2 (two) times daily.   oxybutynin (DITROPAN-XL) 10 MG 24 hr tablet TAKE 1 TABLET BY MOUTH EVERY DAY   potassium chloride (KLOR-CON) 10 MEQ tablet Take 1 tablet (10 mEq total) by mouth daily.   rosuvastatin (CRESTOR) 10 MG tablet TAKE 1 TABLET BY MOUTH EVERY DAY   Venlafaxine HCl 225 MG TB24 Take 1 tablet (225 mg total) by mouth daily. Please schedule office visit before any future refills   [DISCONTINUED] lisinopril (ZESTRIL) 5 MG tablet Take 1 tablet (5 mg total) by mouth daily. Please schedule office visit before any future refills   No facility-administered medications prior to visit.    Review of Systems  Eyes:  Negative for visual disturbance.  Respiratory:  Positive for shortness of breath and wheezing. Negative for cough and chest tightness.   Cardiovascular:  Positive for leg  swelling. Negative for chest pain and palpitations.  Gastrointestinal:  Negative for abdominal pain, nausea and vomiting.  Neurological:  Positive for dizziness, light-headedness and headaches.       Objective    BP 127/86 (BP Location: Left Arm, Patient Position: Sitting, Cuff Size: Large)   Pulse 73   Temp 97.7 F (36.5 C) (Temporal)   Resp 20   Wt 194 lb 14.4 oz (88.4 kg)   SpO2 95%   BMI 32.43 kg/m  BP Readings from Last 3 Encounters:  05/01/22 127/86  04/05/22 120/80  03/27/22 (!) 156/95   Wt Readings from Last  3 Encounters:  05/01/22 194 lb 14.4 oz (88.4 kg)  04/23/22 193 lb (87.5 kg)  04/05/22 193 lb 3.2 oz (87.6 kg)      Physical Exam Vitals reviewed.  Constitutional:      General: She is not in acute distress.    Appearance: Normal appearance. She is well-developed. She is not diaphoretic.  HENT:     Head: Normocephalic and atraumatic.  Eyes:     General: No scleral icterus.    Conjunctiva/sclera: Conjunctivae normal.  Neck:     Thyroid: No thyromegaly.  Cardiovascular:     Rate and Rhythm: Normal rate and regular rhythm.     Heart sounds: Normal heart sounds. No murmur heard. Pulmonary:     Effort: Pulmonary effort is normal. No respiratory distress.     Breath sounds: Normal breath sounds. No wheezing, rhonchi or rales.  Musculoskeletal:     Cervical back: Neck supple.     Right lower leg: No edema.     Left lower leg: No edema.  Lymphadenopathy:     Cervical: No cervical adenopathy.  Skin:    General: Skin is warm and dry.     Findings: No rash.  Neurological:     Mental Status: She is alert and oriented to person, place, and time. Mental status is at baseline.  Psychiatric:        Mood and Affect: Mood normal.        Behavior: Behavior normal.       No results found for any visits on 05/01/22.  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    Fairly well controlled in the office today but seeing quite high BPs at home  Will cautiously increase her antiHTN meds Needs good BP control in setting of aortic aneurysm Continue toprol XL at current dose Increase lisinopril  daily F/u in 2 weks Recheck metabolic panel at that visit      Relevant Medications   lisinopril (ZESTRIL) 10 MG tablet     Endocrine   Subclinical hypothyroidism    Plan to recheck TSH and free T4 at next visit        Other   SOB (shortness of breath)    Chronic and not  improving Discussed the many contributing factors Encouraged to f/u with Pulm and continue to treat severe OSA as istructed      Other Visit Diagnoses     Essential hypertension       Relevant Medications   lisinopril (ZESTRIL) 10 MG tablet        Return in about 2 weeks (around 05/15/2022) for BP and thyroid f/u.      I, Shirlee Latch, MD, have reviewed all documentation for this visit. The documentation on 05/01/22  for the exam, diagnosis, procedures, and orders are all accurate and complete.   Corvin Sorbo, Marzella Schlein, MD, MPH [facility]

## 2022-05-01 NOTE — Telephone Encounter (Signed)
  Chief Complaint: High BP readings Symptoms: nothing new Frequency: yesterday and today Pertinent Negatives: Patient denies any neurological or chest pain Disposition: [] ED /[] Urgent Care (no appt availability in office) / [x] Appointment(In office/virtual)/ []  [facility]/ [] Home Care/ [] Refused Recommended Disposition /[] [facility]/ []  Follow-up with PCP Additional Notes: PT has had high BP readings for the past few days. Pt has missed her BP meds the 2 days previous. She took them today about 7 am and high readings continue. Pt is SOB, but not more than she usually is.     Reason for Disposition  [1] Systolic BP  >= 200 OR Diastolic >= 120 AND [2] having NO cardiac or neurologic symptoms  Answer Assessment - Initial Assessment Questions 1. BLOOD PRESSURE: "What is the blood pressure?" "Did you take at least two measurements 5 minutes apart?"     This morning 212/106.  A little 186/96  and now 169/114. Yesterday 199/ ? 2. ONSET: "When did you take your blood pressure?"     3. HOW: "How did you take your blood pressure?" (e.g., automatic home BP monitor, visiting nurse)     Home cuff 4. HISTORY: "Do you have a history of high blood pressure?"     yes 5. MEDICINES: "Are you taking any medicines for blood pressure?" "Have you missed any doses recently?"     Yes did not take for 2 days. 6. OTHER SYMPTOMS: "Do you have any symptoms?" (e.g., blurred vision, chest pain, difficulty breathing, headache, weakness)     Sunday not feeling well - had diarrhea  Protocols used: Blood Pressure - High-A-AH

## 2022-05-09 DIAGNOSIS — H903 Sensorineural hearing loss, bilateral: Secondary | ICD-10-CM | POA: Diagnosis not present

## 2022-05-18 ENCOUNTER — Encounter: Payer: Self-pay | Admitting: Physician Assistant

## 2022-05-18 ENCOUNTER — Ambulatory Visit: Payer: Medicare PPO | Admitting: Physician Assistant

## 2022-05-18 ENCOUNTER — Ambulatory Visit: Payer: Medicare PPO | Admitting: Family Medicine

## 2022-05-18 VITALS — BP 123/81 | HR 76 | Wt 198.0 lb

## 2022-05-18 DIAGNOSIS — E559 Vitamin D deficiency, unspecified: Secondary | ICD-10-CM | POA: Diagnosis not present

## 2022-05-18 DIAGNOSIS — E1159 Type 2 diabetes mellitus with other circulatory complications: Secondary | ICD-10-CM

## 2022-05-18 DIAGNOSIS — E038 Other specified hypothyroidism: Secondary | ICD-10-CM

## 2022-05-18 DIAGNOSIS — I152 Hypertension secondary to endocrine disorders: Secondary | ICD-10-CM | POA: Diagnosis not present

## 2022-05-18 NOTE — Assessment & Plan Note (Signed)
Repeat tsh/t4 today

## 2022-05-18 NOTE — Assessment & Plan Note (Addendum)
Meds were cautiously increased to lisinopril 10 mg d/t high home blood pressures Bps at home still high -- initial in office high but after she caught her breath it improved.  Advised at home to check while at rest.  Will discuss w/ pcp.  Ordered cmp today

## 2022-05-18 NOTE — Progress Notes (Signed)
I,Sha'taria Tyson,acting as a Neurosurgeon for [employer], PA-C.,have documented all relevant documentation on the behalf of Alfredia Ferguson, PA-C,as directed by  Alfredia Ferguson, PA-C while in the presence of Alfredia Ferguson, PA-C.   Established patient visit   Patient: Natasha Vasquez   DOB: [birth year]   82 y.o. Female  MRN: [id number] Visit Date: 05/18/2022  Today's healthcare provider: Alfredia Ferguson, PA-C   Cc. Htn f/u  Subjective    HPI  Hypertension, follow-up  BP Readings from Last 3 Encounters:  05/18/22 123/81  05/01/22 127/86  04/05/22 120/80   Wt Readings from Last 3 Encounters:  05/18/22 198 lb (89.8 kg)  05/01/22 194 lb 14.4 oz (88.4 kg)  04/23/22 193 lb (87.5 kg)     She was last seen for hypertension 2 weeks ago.  BP at that visit was 127/86. Management since that visit includes increase lisinopril 10 mg.  Outside blood pressures are varied. Best 149/96 worst 196/109 . On average > 160s/>90s Symptoms: light headed No chest pain No chest pressure  No palpitations No syncope  Yes dyspnea Yes orthopnea  No paroxysmal nocturnal dyspnea No lower extremity edema   Pertinent labs Lab Results  Component Value Date   CHOL 189 05/04/2021   HDL 40 (L) 05/04/2021   LDLCALC 104 (H) 05/04/2021   TRIG 224 (H) 05/04/2021   CHOLHDL 4.7 05/04/2021   Lab Results  Component Value Date   NA 141 05/08/2021   K 3.9 05/08/2021   CREATININE 0.87 05/08/2021   GFRNONAA >60 05/08/2021   GLUCOSE 104 (H) 05/08/2021   TSH 4.608 (H) 04/05/2022     The ASCVD Risk score (Arnett DK, et al., 2019) failed to calculate for the following reasons:   The 2019 ASCVD risk score is only valid for ages 15 to 27  ---------------------------------------------------------------------------------------------------   Medications: Outpatient Medications Prior to Visit  Medication Sig   acetaminophen (TYLENOL) 500 MG tablet Take 500 mg by mouth every 6 (six) hours as needed.   albuterol  (VENTOLIN HFA) 108 (90 Base) MCG/ACT inhaler TAKE 2 PUFFS BY MOUTH EVERY 6 HOURS AS NEEDED FOR WHEEZE OR SHORTNESS OF BREATH   allopurinol 200 MG TABS Take 200 mg by mouth daily.   ARIPiprazole (ABILIFY) 2 MG tablet TAKE 1 TABLET BY MOUTH EVERY DAY   aspirin 81 MG tablet Take 81 mg by mouth daily.   bismuth subsalicylate (PEPTO BISMOL) 262 MG chewable tablet Chew 262 mg by mouth as needed. **capsule**   calcium carbonate (TUMS - DOSED IN MG ELEMENTAL CALCIUM) 500 MG chewable tablet Chew 1 tablet by mouth as needed for indigestion or heartburn.   carboxymethylcellulose (REFRESH PLUS) 0.5 % SOLN Place 1 drop into both eyes 3 (three) times daily as needed (Dry Eyes).   colestipol (COLESTID) 1 g tablet Take 1 g by mouth 2 (two) times daily.   famotidine (PEPCID) 20 MG tablet TAKE 1 TABLET BY MOUTH TWICE A DAY   ferrous sulfate 324 MG TBEC Take 324 mg by mouth daily with breakfast.   fluocinonide (LIDEX) 0.05 % external solution APPLY TO AFFECTED AREAS OF SCALP DAILY UNTIL CLEAR, THEN FOR FLARES   Fluticasone-Umeclidin-Vilant (TRELEGY ELLIPTA) 100-62.5-25 MCG/ACT AEPB Inhale 1 puff into the lungs daily.   furosemide (LASIX) 20 MG tablet Take 1 tablet (20 mg total) by mouth daily.   lisinopril (ZESTRIL) 10 MG tablet Take 1 tablet (10 mg total) by mouth daily.   metoprolol succinate (TOPROL XL) 25 MG 24 hr tablet Take  1 tablet (25 mg total) by mouth daily.   Multiple Vitamins-Minerals (CENTRUM SILVER PO) Take 1 tablet by mouth daily.   omeprazole (PRILOSEC) 40 MG capsule Take 1 capsule (40 mg total) by mouth 2 (two) times daily.   oxybutynin (DITROPAN-XL) 10 MG 24 hr tablet TAKE 1 TABLET BY MOUTH EVERY DAY   potassium chloride (KLOR-CON) 10 MEQ tablet Take 1 tablet (10 mEq total) by mouth daily.   rosuvastatin (CRESTOR) 10 MG tablet TAKE 1 TABLET BY MOUTH EVERY DAY   Venlafaxine HCl 225 MG TB24 Take 1 tablet (225 mg total) by mouth daily. Please schedule office visit before any future refills   No  facility-administered medications prior to visit.    Review of Systems  Constitutional:  Positive for fatigue. Negative for fever.  Respiratory:  Positive for shortness of breath. Negative for cough.   Cardiovascular:  Negative for chest pain and leg swelling.  Gastrointestinal:  Negative for abdominal pain.  Neurological:  Negative for dizziness and headaches.     Objective    BP 123/81 (BP Location: Left Arm, Patient Position: Sitting, Cuff Size: Normal)   Pulse 76   Wt 198 lb (89.8 kg)   SpO2 100%   BMI 32.95 kg/m   Physical Exam Vitals reviewed.  Constitutional:      Appearance: She is not ill-appearing.  HENT:     Head: Normocephalic.  Eyes:     Conjunctiva/sclera: Conjunctivae normal.  Cardiovascular:     Rate and Rhythm: Normal rate.  Pulmonary:     Effort: Pulmonary effort is normal. No respiratory distress.  Neurological:     General: No focal deficit present.     Mental Status: She is alert and oriented to person, place, and time.  Psychiatric:        Mood and Affect: Mood normal.        Behavior: Behavior normal.      No results found for any visits on 05/18/22.  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  Advised at home to check while at rest.  Will discuss w/ pcp.  Ordered cmp today      Relevant Orders   Comprehensive Metabolic Panel (CMET)     Endocrine   Subclinical hypothyroidism    Repeat tsh/t4 today      Relevant Orders   TSH + free T4   Other Visit Diagnoses     Avitaminosis D       Relevant Orders   Vitamin D (25 hydroxy)        Return if symptoms worsen or fail to improve.      I, Alfredia Ferguson, PA-C have reviewed all  documentation for this visit. The documentation on  05/18/22   for the exam, diagnosis, procedures, and orders are all accurate and complete.  Alfredia Ferguson, PA-C [facility] [address] [place], [place], [address] Office: [phone] Fax: [phone]   [facility]

## 2022-05-19 LAB — COMPREHENSIVE METABOLIC PANEL
ALT: 21 IU/L (ref 0–32)
AST: 21 IU/L (ref 0–40)
Albumin/Globulin Ratio: 2.1 (ref 1.2–2.2)
Albumin: 4.6 g/dL (ref 3.7–4.7)
Alkaline Phosphatase: 102 IU/L (ref 44–121)
BUN/Creatinine Ratio: 18 (ref 12–28)
BUN: 16 mg/dL (ref 8–27)
Bilirubin Total: 0.2 mg/dL (ref 0.0–1.2)
CO2: 24 mmol/L (ref 20–29)
Calcium: 9.8 mg/dL (ref 8.7–10.3)
Chloride: 102 mmol/L (ref 96–106)
Creatinine, Ser: 0.91 mg/dL (ref 0.57–1.00)
Globulin, Total: 2.2 g/dL (ref 1.5–4.5)
Glucose: 84 mg/dL (ref 70–99)
Potassium: 4 mmol/L (ref 3.5–5.2)
Sodium: 142 mmol/L (ref 134–144)
Total Protein: 6.8 g/dL (ref 6.0–8.5)
eGFR: 63 mL/min/{1.73_m2} (ref >59)

## 2022-05-19 LAB — TSH+FREE T4
Free T4: 0.97 ng/dL (ref 0.82–1.77)
TSH: 3.21 u[IU]/mL (ref 0.450–4.500)

## 2022-05-19 LAB — VITAMIN D 25 HYDROXY (VIT D DEFICIENCY, FRACTURES): Vit D, 25-Hydroxy: 42.2 ng/mL (ref 30.0–100.0)

## 2022-05-27 DIAGNOSIS — G4733 Obstructive sleep apnea (adult) (pediatric): Secondary | ICD-10-CM | POA: Diagnosis not present

## 2022-06-04 ENCOUNTER — Other Ambulatory Visit: Payer: Self-pay | Admitting: Cardiovascular Disease

## 2022-06-05 ENCOUNTER — Other Ambulatory Visit: Payer: Self-pay | Admitting: Family Medicine

## 2022-06-05 DIAGNOSIS — I10 Essential (primary) hypertension: Secondary | ICD-10-CM

## 2022-06-08 DIAGNOSIS — H903 Sensorineural hearing loss, bilateral: Secondary | ICD-10-CM | POA: Diagnosis not present

## 2022-06-11 ENCOUNTER — Other Ambulatory Visit: Payer: Self-pay | Admitting: Family Medicine

## 2022-06-18 DIAGNOSIS — G4733 Obstructive sleep apnea (adult) (pediatric): Secondary | ICD-10-CM | POA: Diagnosis not present

## 2022-06-22 ENCOUNTER — Encounter: Payer: Self-pay | Admitting: Pulmonary Disease

## 2022-06-26 ENCOUNTER — Other Ambulatory Visit: Payer: Self-pay | Admitting: Family Medicine

## 2022-06-26 DIAGNOSIS — N3281 Overactive bladder: Secondary | ICD-10-CM

## 2022-06-26 DIAGNOSIS — M1A9XX0 Chronic gout, unspecified, without tophus (tophi): Secondary | ICD-10-CM

## 2022-06-26 NOTE — Telephone Encounter (Unsigned)
Copied from CRM [id number]. Topic: General - Other >> Jun 26, 2022  3:23 PM Everette C wrote: Reason for CRM: Medication Refill - Medication: allopurinol 200 MG TABS [[id number]]  omeprazole (PRILOSEC) 40 MG capsule [[id number]]   Has the patient contacted their pharmacy? Yes.  The patient will be traveling and needs the medication by 06/29/22 (Agent: If no, request that the patient contact the pharmacy for the refill. If patient does not wish to contact the pharmacy document the reason why and proceed with request.) (Agent: If yes, when and what did the pharmacy advise?)  Preferred Pharmacy (with phone number or street name): [facility] [phone] [name] [place] [address] Phone: [phone] Fax: [phone] Hours: Not open 24 hours   Has the patient been seen for an appointment in the last year OR does the patient have an upcoming appointment? Yes.    Agent: Please be advised that RX refills may take up to 3 business days. We ask that you follow-up with your pharmacy.

## 2022-06-27 DIAGNOSIS — G4733 Obstructive sleep apnea (adult) (pediatric): Secondary | ICD-10-CM | POA: Diagnosis not present

## 2022-06-27 MED ORDER — OMEPRAZOLE 40 MG PO CPDR: 40.0000 mg | DELAYED_RELEASE_CAPSULE | Freq: Two times a day (BID) | ORAL | 0 refills | Status: DC

## 2022-06-27 NOTE — Telephone Encounter (Signed)
Requested Prescriptions  Pending Prescriptions Disp Refills   omeprazole (PRILOSEC) 40 MG capsule 180 capsule 0    Sig: Take 1 capsule (40 mg total) by mouth 2 (two) times daily.     Gastroenterology: Proton Pump Inhibitors Passed - 06/26/2022  5:43 PM      Passed - Valid encounter within last 12 months    Recent Outpatient Visits           1 month ago Hypertension associated with diabetes [facility])   [place] [facility] [place] Alfredia Ferguson, PA-C   1 month ago Hypertension associated with diabetes [facility])   [place] [facility] [place], Marzella Schlein, MD   9 months ago Hypertension associated with diabetes [facility])   [place] [facility] Caro Laroche, DO   1 year ago Community acquired pneumonia of left lung, unspecified part of lung    [facility] [place], Marzella Schlein, MD   1 year ago Encounter for annual physical exam   [facility] [place], Marzella Schlein, MD

## 2022-06-28 ENCOUNTER — Other Ambulatory Visit: Payer: Self-pay | Admitting: Physician Assistant

## 2022-06-28 DIAGNOSIS — M1A9XX0 Chronic gout, unspecified, without tophus (tophi): Secondary | ICD-10-CM

## 2022-06-28 MED ORDER — ALLOPURINOL 100 MG PO TABS: 200.0000 mg | ORAL_TABLET | Freq: Every day | ORAL | 1 refills | Status: DC

## 2022-06-29 ENCOUNTER — Other Ambulatory Visit: Payer: Self-pay | Admitting: Family Medicine

## 2022-06-29 NOTE — Telephone Encounter (Signed)
Unable to refill per protocol, Rx request is too soon. Last refill 06/12/22 for 90 days.  Requested Prescriptions  Pending Prescriptions Disp Refills   Venlafaxine HCl 225 MG TB24 [Pharmacy Med Name: VENLAFAXINE HCL ER 225 MG TAB] 90 tablet 0    Sig: TAKE 1 TABLET (225 MG TOTAL) BY MOUTH DAILY. PLEASE SCHEDULE OFFICE VISIT BEFORE ANY FUTURE REFILLS     Psychiatry: Antidepressants - SNRI - desvenlafaxine & venlafaxine Failed - 06/29/2022 11:15 AM      Failed - Lipid Panel in normal range within the last 12 months    Cholesterol, Total  Date Value Ref Range Status  03/28/2021 257 (H) 100 - 199 mg/dL Final   Cholesterol  Date Value Ref Range Status  05/04/2021 189 0 - 200 mg/dL Final   LDL Chol Calc (NIH)  Date Value Ref Range Status  03/28/2021 132 (H) 0 - 99 mg/dL Final   LDL Cholesterol  Date Value Ref Range Status  05/04/2021 104 (H) 0 - 99 mg/dL Final    Comment:           Total Cholesterol/HDL:CHD Risk Coronary Heart Disease Risk Table                     Men   Women  1/2 Average Risk   3.4   3.3  Average Risk       5.0   4.4  2 X Average Risk   9.6   7.1  3 X Average Risk  23.4   11.0        Use the calculated Patient Ratio above and the CHD Risk Table to determine the patient's CHD Risk.        ATP III CLASSIFICATION (LDL):  <100     mg/dL   Optimal  409-811  mg/dL   Near or Above                    Optimal  130-159  mg/dL   Borderline  914-782  mg/dL   High  >956     mg/dL   Very High Performed at [facility], [address] [place], [place] [address]    HDL  Date Value Ref Range Status  05/04/2021 40 (L) >40 mg/dL Final  65/78/4696 41 >29 mg/dL Final   Triglycerides  Date Value Ref Range Status  05/04/2021 224 (H) <150 mg/dL Final         Passed - Cr in normal range and within 360 days    Creatinine, Ser  Date Value Ref Range Status  05/18/2022 0.91 0.57 - 1.00 mg/dL Final         Passed - Completed PHQ-2 or PHQ-9 in the last 360 days       Passed - Last BP in normal range    BP Readings from Last 1 Encounters:  05/18/22 123/81         Passed - Valid encounter within last 6 months    Recent Outpatient Visits           1 month ago Hypertension associated with diabetes [facility])   [place] [facility] [place], [place], PA-C   1 month ago Hypertension associated with [facility])   [place] [facility] [place], Marzella Schlein, MD   9 months ago Hypertension associated with diabetes [facility])   [place] [facility] Caro Laroche, DO   1 year ago Community acquired pneumonia of left lung, unspecified  part of lung   [facility] [place], Marzella Schlein, MD   1 year ago Encounter for annual physical exam   [facility] [place], Marzella Schlein, MD

## 2022-07-27 DIAGNOSIS — G4733 Obstructive sleep apnea (adult) (pediatric): Secondary | ICD-10-CM | POA: Diagnosis not present

## 2022-08-08 ENCOUNTER — Encounter: Payer: Self-pay | Admitting: Pulmonary Disease

## 2022-08-08 ENCOUNTER — Ambulatory Visit: Payer: Medicare PPO | Admitting: Pulmonary Disease

## 2022-08-08 VITALS — BP 124/80 | HR 72 | Temp 97.7°F | Ht 65.0 in | Wt 198.6 lb

## 2022-08-08 DIAGNOSIS — G4733 Obstructive sleep apnea (adult) (pediatric): Secondary | ICD-10-CM | POA: Diagnosis not present

## 2022-08-08 DIAGNOSIS — I7121 Aneurysm of the ascending aorta, without rupture: Secondary | ICD-10-CM | POA: Diagnosis not present

## 2022-08-08 DIAGNOSIS — R0602 Shortness of breath: Secondary | ICD-10-CM

## 2022-08-08 DIAGNOSIS — R079 Chest pain, unspecified: Secondary | ICD-10-CM | POA: Diagnosis not present

## 2022-08-08 DIAGNOSIS — J45998 Other asthma: Secondary | ICD-10-CM

## 2022-08-08 LAB — NITRIC OXIDE: Nitric Oxide: 28

## 2022-08-08 MED ORDER — TRELEGY ELLIPTA 100-62.5-25 MCG/ACT IN AEPB: 1.0000 | INHALATION_SPRAY | Freq: Every day | RESPIRATORY_TRACT | 11 refills | Status: DC

## 2022-08-08 MED ORDER — TRELEGY ELLIPTA 100-62.5-25 MCG/ACT IN AEPB: 1.0000 | INHALATION_SPRAY | Freq: Every day | RESPIRATORY_TRACT | Status: DC

## 2022-08-08 NOTE — Progress Notes (Signed)
Subjective:    Patient ID: Natasha Vasquez, female    DOB: 02-11-1940, 82 y.o.   MRN: [id number]  Patient Care Team: Erasmo Downer, MD as PCP - General (Family Medicine) Tera Partridge, PA as Physician Assistant (Physician Assistant) Ernest Pine, Illene Labrador, MD as Consulting Physician (Orthopedic Surgery) Byrnett, Merrily Pew, MD (General Surgery) Dasher, Cliffton Asters, MD (Dermatology) Earna Coder, MD as Consulting Physician (Internal Medicine) Lockie Mola, MD as Referring Physician (Ophthalmology) Salena Saner, MD as Consulting Physician (Pulmonary Disease) Loreli Slot, MD as Consulting Physician (Cardiothoracic Surgery) Gaspar Cola, [facility] (Inactive) as Pharmacist (Pharmacist)  Chief Complaint  Patient presents with   Follow-up    DOE. Dry cough. Wheezing.     HPI Natasha Vasquez is an 82 year old very remote former smoker with minimal tobacco history, who presents for follow-up on the issue of dyspnea on exertion that she expresses mostly as fatigue.  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  She was last seen here on 05 April 2022 this is a follow-up visit.  At her prior visit she was given a trial of Trelegy Ellipta for mild intermittent asthmatic bronchitis.  She notes that this has helped the issues with chest congestion and cough.  However she discontinued taking the Trelegy and has noted increased shortness of breath since then.  Also states that she has no energy and no "get up and go".  Previously she had vitamin D levels and thyroid function checked and now these have now normalized.  She has become more acclimatized to her AVAPS device now states she cannot sleep without it.  She has very good compliance.  The compliance download to 93.33% compliance of days of usage over 4 hours.  Her residual AHI when she uses the device is 0.6 which is excellent.  She does note that she is more refreshed in the mornings.  Of concern is  that 6 weeks ago she had an episode of chest pain that was severe enough to limit her activity however, she did not seek any medical attention.  She states that she had a "soreness" in her chest days afterwards.  Given that she has an ascending thoracic aortic aneurysm.    Has not had any fevers, chills or sweats.  No cough.  No hemoptysis.  No paroxysmal nocturnal dyspnea.  No edema or calf tenderness.  She does not endorse any other symptomatology today.     DATA: 04/03/2021 echocardiogram: LVEF 60 to 65%, no regional wall motion abnormalities.  Moderate LVH. Grade 1 DD.  RV function normal. 04/20/2021 PFTs: FEV1 1.95 L or 99% predicted, FVC 2.63 L or 102% predicted, FEV1/FVC 74%, no bronchodilator response.  Lung volumes normal with minimal air trapping.  Diffusion capacity mildly reduced.  Overall normal study. 05/04/2021 CT angio chest: Left upper lobe and lingular pneumonia, no pulmonary embolism 06/01/2021 CT coronary: Coronary score 0, no overt coronary findings on lung windows, small hiatal hernia. 08/18/2021 split-night sleep study: Consistent with severe obstructive sleep apnea and treatment emergent central sleep apnea, titration was not successful.  Eventually patient titrated to AVAPS. 06/01/2021 CT coronary: Normal coronary calcium score of 0, mild to moderately dilated ascending aorta (known issue), no abnormalities on the pulmonary parenchyma visualized.  Small sliding hiatal hernia. 03/09/2022 CT chest contrast: Enlarging ascending thoracic aortic aneurysm measuring up to 4.8 cm, hepatic steatosis, small hiatal hernia, aortic atherosclerosis.  Review of Systems A 10 point review of systems was performed  and it is as noted above otherwise negative.   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 (HCC) 05/10/2021   CAP (community acquired pneumonia)  05/04/2021   HLD (hyperlipidemia) 05/04/2021   Elevated troponin 05/04/2021   Chronic diastolic CHF (congestive heart failure) (HCC) 05/04/2021   Recurrent major depressive disorder, in partial remission (HCC) 03/24/2021   Gout 03/24/2021   PAD (peripheral artery disease) (HCC) 06/24/2020   Iron deficiency 10/07/2018   Primary osteoarthritis of left knee 09/17/2018   Emphysema lung (HCC) 03/14/2018   Diabetic peripheral neuropathy associated with type 2 diabetes mellitus (HCC) 03/14/2018   Metatarsalgia of both feet 03/14/2018   Abnormality of lung 03/14/2018   Acquired absence of right breast 06/11/2017   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   Dysphagia 03/11/2017   Obesity 03/11/2017   Subclinical hypothyroidism 06/08/2015   Osteopenia 06/08/2015   Billowing mitral valve 06/08/2015   Melanoma in situ (HCC) 06/08/2015   Cramps of lower extremity 06/08/2015   Type 2 diabetes mellitus (HCC) 05/18/2015   Chronic diarrhea 05/18/2015   Anemia 05/18/2015   Clinical depression 01/26/2015   Hyperlipidemia associated with type 2 diabetes mellitus (HCC) 10/28/2014   Hypertension associated with diabetes (HCC) 09/27/2014   GERD (gastroesophageal reflux disease) 08/06/2014   Overactive bladder 07/16/2014    Social History   Tobacco Use   Smoking status: Former    Current packs/day: 0.00    Types: Cigarettes    Start date: 1963    Quit date: 1965    Years since quitting: 59.6   Smokeless tobacco: Never   Tobacco comments:    smoked in college--1 pack would last one week.  Substance Use Topics   Alcohol use: Yes    Alcohol/week: 7.0 - 14.0 standard drinks of alcohol    Types: 7 - 14 Glasses of wine per week    Comment: 1-2 glasses of wine per night    Allergies  Allergen Reactions   Codeine Other (See Comments)    Chest pain   Lorazepam Other (See Comments)    "Feels loopy"    Oxycodone Other (See Comments)    MENTAL CHANGES   Paregoric     Chest pain---tolerated MORPHINE   Penicillins Hives    Has patient had a PCN reaction causing immediate rash, facial/tongue/throat swelling, SOB or lightheadedness with hypotension: Yes Has patient had a PCN reaction causing severe rash involving mucus membranes or skin necrosis: No Has patient had a PCN reaction that required hospitalization: NO Has patient had a PCN reaction occurring within the last 10 years: No If all of the above answers are "NO", then may proceed with Cephalosporin use.  Can take Augmentin   Quinolones     PATIENT UNAWARE OF allergy to this class of antibiotics, only aware of allergy to [facility] Patient was warned about not using Cipro and similar antibiotics. Recent studies have raised concern that fluoroquinolone antibiotics could be associated with an increased risk of aortic aneurysm Fluoroquinolones have non-antimicrobial properties that might jeopardise the integrity of the extracellular matrix of the vascular wall In a  propensity score matched cohort study in [place], there was a 66% increased rate   Aleve [Naproxen Sodium] Hives, Rash and Other (See Comments)    headaches    Current Meds  Medication Sig   acetaminophen (TYLENOL) 500 MG tablet Take 500 mg by mouth every 6 (six) hours as needed.   albuterol (VENTOLIN HFA) 108 (90 Base) MCG/ACT inhaler TAKE 2 PUFFS BY MOUTH EVERY 6 HOURS AS NEEDED FOR WHEEZE OR SHORTNESS OF BREATH   allopurinol (ZYLOPRIM) 100 MG tablet Take 2 tablets (200 mg total) by mouth daily.   ARIPiprazole (ABILIFY) 2 MG tablet TAKE 1 TABLET BY MOUTH EVERY DAY   aspirin 81 MG tablet Take 81 mg by mouth daily.   bismuth subsalicylate (PEPTO BISMOL) 262 MG chewable tablet Chew 262 mg by mouth as needed. **capsule**   calcium carbonate (TUMS - DOSED IN MG ELEMENTAL CALCIUM) 500 MG chewable tablet Chew 1 tablet by mouth as needed for indigestion or heartburn.    carboxymethylcellulose (REFRESH PLUS) 0.5 % SOLN Place 1 drop into both eyes 3 (three) times daily as needed (Dry Eyes).   colestipol (COLESTID) 1 g tablet Take 1 g by mouth 2 (two) times daily.   famotidine (PEPCID) 20 MG tablet TAKE 1 TABLET BY MOUTH TWICE A DAY   ferrous sulfate 324 MG TBEC Take 324 mg by mouth daily with breakfast.   fluocinonide (LIDEX) 0.05 % external solution APPLY TO AFFECTED AREAS OF SCALP DAILY UNTIL CLEAR, THEN FOR FLARES   Fluticasone-Umeclidin-Vilant (TRELEGY ELLIPTA) 100-62.5-25 MCG/ACT AEPB Inhale 1 puff into the lungs daily.   Fluticasone-Umeclidin-Vilant (TRELEGY ELLIPTA) 100-62.5-25 MCG/ACT AEPB Inhale 1 puff into the lungs daily.   furosemide (LASIX) 20 MG tablet TAKE 1 TABLET BY MOUTH EVERY DAY   lisinopril (ZESTRIL) 10 MG tablet Take 1 tablet (10 mg total) by mouth daily.   lisinopril (ZESTRIL) 5 MG tablet Take 1 tablet (5 mg total) by mouth daily.   metoprolol succinate (TOPROL XL) 25 MG 24 hr tablet Take 1 tablet (25 mg total) by mouth daily.   Multiple Vitamins-Minerals (CENTRUM SILVER PO) Take 1 tablet by mouth daily.   omeprazole (PRILOSEC) 40 MG capsule Take 1 capsule (40 mg total) by mouth 2 (two) times daily.   oxybutynin (DITROPAN-XL) 10 MG 24 hr tablet TAKE 1 TABLET BY MOUTH EVERY DAY   potassium chloride (KLOR-CON) 10 MEQ tablet TAKE 1 TABLET BY MOUTH EVERY DAY   rosuvastatin (CRESTOR) 10 MG tablet TAKE 1 TABLET BY MOUTH EVERY DAY   Venlafaxine HCl 225 MG TB24 TAKE 1 TABLET (225 MG TOTAL) BY MOUTH DAILY. PLEASE SCHEDULE OFFICE VISIT BEFORE ANY FUTURE REFILLS    Immunization History  Administered Date(s) Administered   Fluad Quad(high Dose 65+) 09/09/2018, 10/12/2019, 10/14/2020, 09/25/2021   Influenza Split 10/24/2011   Influenza, High Dose Seasonal PF 11/18/2013, 01/24/2016, 10/08/2016, 09/12/2017   Influenza-Unspecified 10/24/2011, 10/14/2012, 11/18/2013   Moderna Sars-Covid-2 Vaccination 01/30/2019, 03/02/2019   Pneumococcal Conjugate-13  08/21/2013   Pneumococcal Polysaccharide-23 01/24/2016   Respiratory Syncytial Virus Vaccine,Recomb Aduvanted(Arexvy) 11/11/2021   Tdap 10/24/2011   Zoster Recombinant(Shingrix) 04/01/2016, 08/14/2016        Objective:     BP 124/80 (BP Location: Left Arm, Cuff Size: Normal)   Pulse 72   Temp 97.7 F (36.5 C)   Ht 5\' 5"  (1.651 m)   Wt 198 lb 9.6 oz (90.1 kg)   SpO2 100%   BMI 33.05 kg/m   SpO2: 100 % O2 Device: None (Room air)  GENERAL: Overweight woman, spry, age-appropriate, fully ambulatory, mild conversational dyspnea. HEAD: Normocephalic, atraumatic.  EYES: Pupils equal, round, reactive to light.  No scleral icterus.  MOUTH: Oral mucosa moist, no thrush. NECK: Supple. No thyromegaly. Trachea  midline. No JVD.  No adenopathy.  Faint bilateral carotid bruits. PULMONARY: Good air entry bilaterally.  Faint end expiratory wheezes noted.Marland Kitchen CARDIOVASCULAR: S1 and S2. Regular rate and rhythm.  Has a grade 1/6 systolic ejection murmur in the mitral position. ABDOMEN: Slightly protuberant otherwise benign MUSCULOSKELETAL: No joint deformity, no clubbing, no edema.  NEUROLOGIC: No overt focal deficit, no gait disturbance, speech is fluent. SKIN: Intact,warm,dry. PSYCH: Mood and behavior normal.  Lab Results  Component Value Date   NITRICOXIDE 28 08/08/2022      Assessment & Plan:     ICD-10-CM   1. Aneurysm of ascending aorta without rupture (HCC)  I71.21  CT ANGIO CHEST AORTA W/CM & OR WO/CM     Enlarging as of last CT of 02/2022, 4.8 cm at time Recent episode of chest pain CT angio chest to reevaluate    2. Asthma, persistent not controlled  J45.998 Pulmonary Function Test [facility] Only    Nitric oxide   Resume Trelegy 100, 1 inhalation daily Continue as needed albuterol    3. SOB (shortness of breath)  R06.02 ECHOCARDIOGRAM COMPLETE    Pulmonary Function Test [facility] Only    Nitric oxide   Reassess with PFTs Reassess with 2D echo    4. Chest pain, unspecified type   R07.9 ECHOCARDIOGRAM COMPLETE    CT ANGIO CHEST AORTA W/CM & OR WO/CM   Of concern given ascending aortic aneurysm CT angio as above    5. OSA (obstructive sleep apnea)  G47.33    Continue AVAPS Patient compliant      Orders Placed This Encounter  Procedures   CT ANGIO CHEST AORTA W/CM & OR WO/CM    [employer] MEDICARE CHOICE PPO Wt 198/No Needs/ /nt diabetic/nkda to iv dye or contrast//no kid dz or liver dz /No spinal cord/No body injector/no glucose mon/no heart monitor//ab w/anit@ofc  Please remember if you need to cancel your appt, please do so 24 hours prior to your appointment to avoid getting charged a no-show fee of $75.00 pt is aware/pt verbal understood instructions given    Standing Status:   Future    Standing Expiration Date:   08/08/2023    Order Specific Question:   If indicated for the ordered procedure, I authorize the administration of contrast media per Radiology protocol    Answer:   Yes    Order Specific Question:   Initiate TAVR/CTA/Valve Evaluation Adult Protocol    Answer:   Yes    Order Specific Question:   Does the patient have a contrast media/X-ray dye allergy?    Answer:   No    Order Specific Question:   Preferred imaging location?    Answer:   [facility]   Pulmonary Function Test [facility] Only    Standing Status:   Future    Standing Expiration Date:   08/08/2023    Order Specific Question:   Full PFT: includes the following: basic spirometry, spirometry pre & post bronchodilator, diffusion capacity (DLCO), lung volumes    Answer:   Full PFT    Order Specific Question:   This test can only be performed at    Answer:   [facility]   Nitric oxide   ECHOCARDIOGRAM COMPLETE    Standing Status:   Future    Standing Expiration Date:   08/08/2023    Order Specific Question:   Where should this test be performed    Answer:   [facility] [place]    Order Specific Question:   Perflutren DEFINITY (  image enhancing agent) should be administered unless  hypersensitivity or allergy exist    Answer:   Administer Perflutren    Order Specific Question:   Reason for exam-Echo    Answer:   Dyspnea  R06.00    Meds ordered this encounter  Medications   Fluticasone-Umeclidin-Vilant (TRELEGY ELLIPTA) 100-62.5-25 MCG/ACT AEPB    Sig: Inhale 1 puff into the lungs daily.    Dispense:  60 each    Refill:  11   Fluticasone-Umeclidin-Vilant (TRELEGY ELLIPTA) 100-62.5-25 MCG/ACT AEPB    Sig: Inhale 1 puff into the lungs daily.    Order Specific Question:   Lot Number?    Answer:   [id number]    Order Specific Question:   Expiration Date?    Answer:   11/16/2023    Order Specific Question:   Quantity    Answer:   1   The patient in follow-up in 4 to 6 weeks time she is to call sooner should any new problems arise.   Gailen Shelter, MD Advanced Bronchoscopy [facility]-    *This note was dictated using voice recognition software/Dragon.  Despite best efforts to proofread, errors can occur which can change the meaning. Any transcriptional errors that result from this process are unintentional and may not be fully corrected at the time of dictation.

## 2022-08-08 NOTE — Patient Instructions (Signed)
We gave you samples of the Trelegy and sent a prescription to the pharmacy for the same.  Trelegy is 1 puff a day.  Make sure you rinse your mouth well after you use it.  You did have a slight increase in inflammation in your airway today as noted by the test we did in the office.  This is usually seen with asthma.  Continue using your AVAPS machine at night.  We have ordered a CT of the chest, heart test and breathing tests.  We will see you in follow-up in 4 to 6 weeks time call sooner should any new problems arise.

## 2022-08-09 ENCOUNTER — Encounter: Payer: Self-pay | Admitting: Pulmonary Disease

## 2022-08-10 ENCOUNTER — Other Ambulatory Visit: Payer: Self-pay | Admitting: Family Medicine

## 2022-08-10 DIAGNOSIS — E1169 Type 2 diabetes mellitus with other specified complication: Secondary | ICD-10-CM

## 2022-08-10 NOTE — Telephone Encounter (Signed)
Requested Prescriptions  Pending Prescriptions Disp Refills   rosuvastatin (CRESTOR) 10 MG tablet [Pharmacy Med Name: ROSUVASTATIN CALCIUM 10 MG TAB] 90 tablet 0    Sig: TAKE 1 TABLET BY MOUTH EVERY DAY     Cardiovascular:  Antilipid - Statins 2 Failed - 08/10/2022  2:34 AM      Failed - Lipid Panel in normal range within the last 12 months    Cholesterol, Total  Date Value Ref Range Status  03/28/2021 257 (H) 100 - 199 mg/dL Final   Cholesterol  Date Value Ref Range Status  05/04/2021 189 0 - 200 mg/dL Final   LDL Chol Calc (NIH)  Date Value Ref Range Status  03/28/2021 132 (H) 0 - 99 mg/dL Final   LDL Cholesterol  Date Value Ref Range Status  05/04/2021 104 (H) 0 - 99 mg/dL Final    Comment:           Total Cholesterol/HDL:CHD Risk Coronary Heart Disease Risk Table                     Men   Women  1/2 Average Risk   3.4   3.3  Average Risk       5.0   4.4  2 X Average Risk   9.6   7.1  3 X Average Risk  23.4   11.0        Use the calculated Patient Ratio above and the CHD Risk Table to determine the patient's CHD Risk.        ATP III CLASSIFICATION (LDL):  <100     mg/dL   Optimal  366-440  mg/dL   Near or Above                    Optimal  130-159  mg/dL   Borderline  347-425  mg/dL   High  >956     mg/dL   Very High Performed at [facility], [address] [place], [place] [address]    HDL  Date Value Ref Range Status  05/04/2021 40 (L) >40 mg/dL Final  43/32/9518 41 >84 mg/dL Final   Triglycerides  Date Value Ref Range Status  05/04/2021 224 (H) <150 mg/dL Final         Passed - Cr in normal range and within 360 days    Creatinine, Ser  Date Value Ref Range Status  05/18/2022 0.91 0.57 - 1.00 mg/dL Final         Passed - Patient is not pregnant      Passed - Valid encounter within last 12 months    Recent Outpatient Visits           2 months ago Hypertension associated with diabetes [facility])   [facility]  Ok Edwards, [place], PA-C   3 months ago Hypertension associated with diabetes [facility])   [place] [facility] [place], Marzella Schlein, MD   10 months ago Hypertension associated with diabetes [facility])   [place] [facility] Caro [place], DO   1 year ago Community acquired pneumonia of left lung, unspecified part of lung   [place] [facility] [place], Marzella Schlein, MD   1 year ago Encounter for annual physical exam   [facility] [place], Marzella Schlein, MD

## 2022-08-13 ENCOUNTER — Ambulatory Visit
Admission: RE | Admit: 2022-08-13 | Discharge: 2022-08-13 | Disposition: A | Discharge status: Home / Self Care | Payer: Medicare PPO | Source: Ambulatory Visit | Attending: Pulmonary Disease | Admitting: Pulmonary Disease

## 2022-08-13 DIAGNOSIS — R079 Chest pain, unspecified: Secondary | ICD-10-CM | POA: Diagnosis not present

## 2022-08-13 DIAGNOSIS — I700 Atherosclerosis of aorta: Secondary | ICD-10-CM | POA: Diagnosis not present

## 2022-08-13 DIAGNOSIS — I7121 Aneurysm of the ascending aorta, without rupture: Secondary | ICD-10-CM | POA: Diagnosis not present

## 2022-08-13 MED ORDER — IOPAMIDOL (ISOVUE-370) INJECTION 76%
75.0000 mL | Freq: Once | INTRAVENOUS | Status: AC | PRN
  Administered 2022-08-13: 75 mL via INTRAVENOUS

## 2022-08-23 ENCOUNTER — Ambulatory Visit: Payer: Medicare PPO | Attending: Pulmonary Disease

## 2022-08-23 DIAGNOSIS — R0602 Shortness of breath: Secondary | ICD-10-CM | POA: Insufficient documentation

## 2022-08-23 DIAGNOSIS — Z87891 Personal history of nicotine dependence: Secondary | ICD-10-CM | POA: Insufficient documentation

## 2022-08-23 DIAGNOSIS — J449 Chronic obstructive pulmonary disease, unspecified: Secondary | ICD-10-CM | POA: Insufficient documentation

## 2022-08-23 DIAGNOSIS — J45998 Other asthma: Secondary | ICD-10-CM | POA: Insufficient documentation

## 2022-08-23 LAB — PULMONARY FUNCTION TEST ARMC ONLY
DL/VA % pred: 77 %
DL/VA: 3.1 ml/min/mmHg/L
DLCO unc % pred: 80 %
DLCO unc: 15.5 ml/min/mmHg
FEF 25-75 Post: 1.49 L/sec
FEF 25-75 Pre: 0.83 L/sec
FEF2575-%Change-Post: 79 %
FEF2575-%Pred-Post: 109 %
FEF2575-%Pred-Pre: 60 %
FEV1-%Change-Post: 15 %
FEV1-%Pred-Post: 96 %
FEV1-%Pred-Pre: 83 %
FEV1-Post: 1.89 L
FEV1-Pre: 1.64 L
FEV1FVC-%Change-Post: 5 %
FEV1FVC-%Pred-Pre: 90 %
FEV6-%Change-Post: 9 %
FEV6-%Pred-Post: 108 %
FEV6-%Pred-Pre: 99 %
FEV6-Post: 2.71 L
FEV6-Pre: 2.48 L
FEV6FVC-%Change-Post: 0 %
FEV6FVC-%Pred-Post: 105 %
FEV6FVC-%Pred-Pre: 105 %
FVC-%Change-Post: 9 %
FVC-%Pred-Post: 102 %
FVC-%Pred-Pre: 93 %
FVC-Post: 2.71 L
FVC-Pre: 2.48 L
Post FEV1/FVC ratio: 70 %
Post FEV6/FVC ratio: 100 %
Pre FEV1/FVC ratio: 66 %
Pre FEV6/FVC Ratio: 100 %
RV % pred: 90 %
RV: 2.25 L
TLC % pred: 94 %
TLC: 4.93 L

## 2022-08-23 MED ORDER — ALBUTEROL SULFATE (2.5 MG/3ML) 0.083% IN NEBU
2.5000 mg | INHALATION_SOLUTION | Freq: Once | RESPIRATORY_TRACT | Status: AC
  Administered 2022-08-23: 2.5 mg via RESPIRATORY_TRACT
  Filled 2022-08-23: qty 3

## 2022-08-27 DIAGNOSIS — G4733 Obstructive sleep apnea (adult) (pediatric): Secondary | ICD-10-CM | POA: Diagnosis not present

## 2022-08-28 ENCOUNTER — Other Ambulatory Visit: Payer: Self-pay | Admitting: Thoracic Surgery (Cardiothoracic Vascular Surgery)

## 2022-08-28 DIAGNOSIS — I7121 Aneurysm of the ascending aorta, without rupture: Secondary | ICD-10-CM

## 2022-09-04 ENCOUNTER — Other Ambulatory Visit: Payer: Self-pay | Admitting: Family Medicine

## 2022-09-05 ENCOUNTER — Encounter: Payer: Self-pay | Admitting: Family Medicine

## 2022-09-05 ENCOUNTER — Ambulatory Visit: Payer: Medicare PPO | Attending: Pulmonary Disease

## 2022-09-05 DIAGNOSIS — R079 Chest pain, unspecified: Secondary | ICD-10-CM

## 2022-09-05 DIAGNOSIS — R0602 Shortness of breath: Secondary | ICD-10-CM | POA: Diagnosis not present

## 2022-09-05 LAB — ECHOCARDIOGRAM COMPLETE
Area-P 1/2: 3.77 cm2
S' Lateral: 2.8 cm

## 2022-09-05 MED ORDER — PERFLUTREN LIPID MICROSPHERE
1.0000 mL | INTRAVENOUS | Status: AC | PRN
  Administered 2022-09-05: 2 mL via INTRAVENOUS

## 2022-09-06 ENCOUNTER — Ambulatory Visit: Payer: Medicare PPO | Admitting: Pulmonary Disease

## 2022-09-06 ENCOUNTER — Encounter: Payer: Self-pay | Admitting: Pulmonary Disease

## 2022-09-06 VITALS — BP 124/84 | HR 85 | Temp 97.7°F | Ht 65.0 in | Wt 196.4 lb

## 2022-09-06 DIAGNOSIS — M546 Pain in thoracic spine: Secondary | ICD-10-CM

## 2022-09-06 DIAGNOSIS — I7121 Aneurysm of the ascending aorta, without rupture: Secondary | ICD-10-CM | POA: Diagnosis not present

## 2022-09-06 DIAGNOSIS — M2550 Pain in unspecified joint: Secondary | ICD-10-CM

## 2022-09-06 DIAGNOSIS — G4733 Obstructive sleep apnea (adult) (pediatric): Secondary | ICD-10-CM | POA: Diagnosis not present

## 2022-09-06 DIAGNOSIS — J4530 Mild persistent asthma, uncomplicated: Secondary | ICD-10-CM | POA: Diagnosis not present

## 2022-09-06 DIAGNOSIS — R5382 Chronic fatigue, unspecified: Secondary | ICD-10-CM

## 2022-09-06 MED ORDER — COLESTIPOL HCL 1 G PO TABS: 1.0000 g | ORAL_TABLET | Freq: Two times a day (BID) | ORAL | 2 refills | Status: DC

## 2022-09-06 NOTE — Progress Notes (Signed)
Subjective:    Patient ID: Natasha Vasquez, female    DOB: April 30, 1940, 82 y.o.   MRN: [id number]  Patient Care Team: Erasmo Downer, MD as PCP - General (Family Medicine) Tera Partridge, PA as Physician Assistant (Physician Assistant) Ernest Pine, Illene Labrador, MD as Consulting Physician (Orthopedic Surgery) Byrnett, Merrily Pew, MD (General Surgery) Dasher, Cliffton Asters, MD (Dermatology) Earna Coder, MD as Consulting Physician (Internal Medicine) Lockie Mola, MD as Referring Physician (Ophthalmology) Salena Saner, MD as Consulting Physician (Pulmonary Disease) Loreli Slot, MD as Consulting Physician (Cardiothoracic Surgery) Gaspar Cola, [facility] (Inactive) as Pharmacist (Pharmacist)  Chief Complaint  Patient presents with   Follow-up    DOE. Little wheezing. Dry cough.   HPI [name] is an 82 year old very remote former smoker with minimal tobacco history, who presents for follow-up on the issue of dyspnea on exertion that she expresses mostly as fatigue.  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  She was last seen here on 08 August 2022 this is a follow-up visit.  At her prior visit she was given a trial of Trelegy Ellipta for mild intermittent asthmatic bronchitis.  She notes that this has helped the issues with chest congestion and cough.  However it does not do much for her fatigue.  Also states that she has no energy and no "get up and go".  Previously she had vitamin D levels and thyroid function checked and now these have now normalized.  She has become more acclimatized to her AVAPS device now states she cannot sleep without it.  She has very good compliance.  The compliance download to 93.33% compliance of days of usage over 4 hours.  Her residual AHI when she uses the device is 0.9which is excellent.  She does note that she is more refreshed in the mornings.   At her prior visit we ordered a repeat evaluation of her thoracic  aneurysm given that she had had an episode of chest pain which she did not seek medical attention for.  This did not show any dissection or thrombus.  The aneurysm measures up to 4.7 cm.  She follows with Dr. Dorris Fetch for this.    Has not had any fevers, chills or sweats.  No cough.  No hemoptysis.  No paroxysmal nocturnal dyspnea.  No edema or calf tenderness.  She does not endorse any other symptomatology today.  She has very minimal airways dysfunction on PFTs, her sensation of dyspnea and fatigue is out of proportion to these very minimal findings.  She states that sometimes she feels short of breath even talking.  We discussed that it is imperative that she engage in a conditioning program as I suspect a lot of her issues are due to deconditioning.  She has bilateral hip pain and arthralgias.  She has back pain lower thoracic to lumbar.  No muscle weakness.   DATA: 04/03/2021 echocardiogram: LVEF 60 to 65%, no regional wall motion abnormalities.  Moderate LVH. Grade 1 DD.  RV function normal. 04/20/2021 PFTs: FEV1 1.95 L or 99% predicted, FVC 2.63 L or 102% predicted, FEV1/FVC 74%, no bronchodilator response.  Lung volumes normal with minimal air trapping.  Diffusion capacity mildly reduced.  Overall normal study. 05/04/2021 CT angio chest: Left upper lobe and lingular pneumonia, no pulmonary embolism 06/01/2021 CT coronary: Coronary score 0, no overt coronary findings on lung windows, small hiatal hernia. 08/18/2021 split-night sleep study: Consistent with severe obstructive sleep apnea and  treatment emergent central sleep apnea, titration was not successful.  Eventually patient titrated to AVAPS. 06/01/2021 CT coronary: Normal coronary calcium score of 0, mild to moderately dilated ascending aorta (known issue), no abnormalities on the pulmonary parenchyma visualized.  Small sliding hiatal hernia. 03/09/2022 CT chest contrast: Enlarging ascending thoracic aortic aneurysm measuring up to 4.8  cm, hepatic steatosis, small hiatal hernia, aortic atherosclerosis. 08/13/2022 CT chest with contrast: Unchanged size of thoracic aortic aneurysm estimated to be 4.7 cm concurrent study.  Central airways clear.  No pleural effusion.  No airspace disease.  Hiatal hernia present.  No other acute finding in the upper abdomen. 08/23/2022 PFTs: FEV1 1.64 L or 83% predicted, FVC 2.48 L or 93% predicted, FEV1/FVC 66%, diffusion capacity normal, lung volumes normal.  Normal airway obstruction presents suggesting small airways disease.  There is bronchodilator response.  Review of Systems A 10 point review of systems was performed and it is as noted above otherwise negative.   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 (HCC) 05/10/2021   CAP (community acquired pneumonia) 05/04/2021   HLD (hyperlipidemia) 05/04/2021   Elevated troponin 05/04/2021   Chronic diastolic CHF (congestive heart failure) (HCC) 05/04/2021   Recurrent major depressive disorder, in partial remission (HCC) 03/24/2021   Gout 03/24/2021   PAD (peripheral artery disease) (HCC) 06/24/2020   Iron deficiency 10/07/2018   Primary osteoarthritis of left knee 09/17/2018   Emphysema lung (HCC) 03/14/2018   Diabetic peripheral neuropathy associated with type 2 diabetes mellitus (HCC) 03/14/2018   Metatarsalgia of both feet 03/14/2018   Abnormality of lung 03/14/2018   Acquired absence of right breast 06/11/2017   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   Dysphagia 03/11/2017   Obesity 03/11/2017   Subclinical hypothyroidism 06/08/2015   Osteopenia 06/08/2015   Billowing mitral valve 06/08/2015   Melanoma in situ (HCC) 06/08/2015   Cramps of lower extremity 06/08/2015   Type 2 diabetes mellitus (HCC) 05/18/2015    Chronic diarrhea 05/18/2015   Anemia 05/18/2015   Clinical depression 01/26/2015   Hyperlipidemia associated with type 2 diabetes mellitus (HCC) 10/28/2014   Hypertension associated with diabetes (HCC) 09/27/2014   GERD (gastroesophageal reflux disease) 08/06/2014   Overactive bladder 07/16/2014    Social History   Tobacco Use   Smoking status: Former    Current packs/day: 0.00    Types: Cigarettes    Start date: 1963    Quit date: 1965    Years since quitting: 59.6   Smokeless tobacco: Never   Tobacco comments:    smoked in college--1 pack would last one week.  Substance Use Topics   Alcohol use: Yes    Alcohol/week: 7.0 - 14.0 standard drinks of alcohol    Types: 7 - 14 Glasses of wine per week    Comment: 1-2 glasses of wine per night    Allergies  Allergen Reactions   Codeine Other (See Comments)    Chest pain   Lorazepam Other (See Comments)    "Feels loopy"   Oxycodone Other (See Comments)    MENTAL CHANGES   Paregoric     Chest pain---tolerated MORPHINE   Penicillins Hives    Has patient had a PCN reaction causing immediate rash, facial/tongue/throat swelling, SOB or lightheadedness with hypotension: Yes Has patient had a  PCN reaction causing severe rash involving mucus membranes or skin necrosis: No Has patient had a PCN reaction that required hospitalization: NO Has patient had a PCN reaction occurring within the last 10 years: No If all of the above answers are "NO", then may proceed with Cephalosporin use.  Can take Augmentin   Quinolones     PATIENT UNAWARE OF allergy to this class of antibiotics, only aware of allergy to [facility] Patient was warned about not using Cipro and similar antibiotics. Recent studies have raised concern that fluoroquinolone antibiotics could be associated with an increased risk of aortic aneurysm Fluoroquinolones have non-antimicrobial properties that might jeopardise the integrity of the extracellular matrix of the vascular  wall In a  propensity score matched cohort study in [place], there was a 66% increased rate   Aleve [Naproxen Sodium] Hives, Rash and Other (See Comments)    headaches    Current Meds  Medication Sig   acetaminophen (TYLENOL) 500 MG tablet Take 500 mg by mouth every 6 (six) hours as needed.   albuterol (VENTOLIN HFA) 108 (90 Base) MCG/ACT inhaler TAKE 2 PUFFS BY MOUTH EVERY 6 HOURS AS NEEDED FOR WHEEZE OR SHORTNESS OF BREATH   allopurinol (ZYLOPRIM) 100 MG tablet Take 2 tablets (200 mg total) by mouth daily.   ARIPiprazole (ABILIFY) 2 MG tablet TAKE 1 TABLET BY MOUTH EVERY DAY   aspirin 81 MG tablet Take 81 mg by mouth daily.   bismuth subsalicylate (PEPTO BISMOL) 262 MG chewable tablet Chew 262 mg by mouth as needed. **capsule**   calcium carbonate (TUMS - DOSED IN MG ELEMENTAL CALCIUM) 500 MG chewable tablet Chew 1 tablet by mouth as needed for indigestion or heartburn.   carboxymethylcellulose (REFRESH PLUS) 0.5 % SOLN Place 1 drop into both eyes 3 (three) times daily as needed (Dry Eyes).   Cholecalciferol (VITAMIN D-3 PO) Take 1 tablet by mouth daily.   colestipol (COLESTID) 1 g tablet Take 1 tablet (1 g total) by mouth 2 (two) times daily.   famotidine (PEPCID) 20 MG tablet TAKE 1 TABLET BY MOUTH TWICE A DAY   ferrous sulfate 324 MG TBEC Take 324 mg by mouth daily with breakfast.   fluocinonide (LIDEX) 0.05 % external solution APPLY TO AFFECTED AREAS OF SCALP DAILY UNTIL CLEAR, THEN FOR FLARES   Fluticasone-Umeclidin-Vilant (TRELEGY ELLIPTA) 100-62.5-25 MCG/ACT AEPB Inhale 1 puff into the lungs daily.   Fluticasone-Umeclidin-Vilant (TRELEGY ELLIPTA) 100-62.5-25 MCG/ACT AEPB Inhale 1 puff into the lungs daily.   furosemide (LASIX) 20 MG tablet TAKE 1 TABLET BY MOUTH EVERY DAY   lisinopril (ZESTRIL) 10 MG tablet Take 1 tablet (10 mg total) by mouth daily.   lisinopril (ZESTRIL) 5 MG tablet Take 1 tablet (5 mg total) by mouth daily.   metoprolol succinate (TOPROL XL) 25 MG 24 hr tablet  Take 1 tablet (25 mg total) by mouth daily.   Multiple Vitamins-Minerals (CENTRUM SILVER PO) Take 1 tablet by mouth daily.   omeprazole (PRILOSEC) 40 MG capsule Take 1 capsule (40 mg total) by mouth 2 (two) times daily.   oxybutynin (DITROPAN-XL) 10 MG 24 hr tablet TAKE 1 TABLET BY MOUTH EVERY DAY   potassium chloride (KLOR-CON) 10 MEQ tablet TAKE 1 TABLET BY MOUTH EVERY DAY   rosuvastatin (CRESTOR) 10 MG tablet TAKE 1 TABLET BY MOUTH EVERY DAY   Venlafaxine HCl 225 MG TB24 TAKE 1 TABLET (225 MG TOTAL) BY MOUTH DAILY. PLEASE SCHEDULE OFFICE VISIT BEFORE ANY FUTURE REFILLS    Immunization History  Administered Date(s) Administered  Fluad Quad(high Dose 65+) 09/09/2018, 10/12/2019, 10/14/2020, 09/25/2021   Influenza Split 10/24/2011   Influenza, High Dose Seasonal PF 11/18/2013, 01/24/2016, 10/08/2016, 09/12/2017   Influenza-Unspecified 10/24/2011, 10/14/2012, 11/18/2013   Moderna Sars-Covid-2 Vaccination 01/30/2019, 03/02/2019   Pneumococcal Conjugate-13 08/21/2013   Pneumococcal Polysaccharide-23 01/24/2016   Respiratory Syncytial Virus Vaccine,Recomb Aduvanted(Arexvy) 11/11/2021   Tdap 10/24/2011   Zoster Recombinant(Shingrix) 04/01/2016, 08/14/2016        Objective:     BP 124/84 (BP Location: Left Arm, Cuff Size: Normal)   Pulse 85   Temp 97.7 F (36.5 C)   Ht 5\' 5"  (1.651 m)   Wt 196 lb 6.4 oz (89.1 kg)   SpO2 91%   BMI 32.68 kg/m   SpO2: 91 % O2 Device: None (Room air)  GENERAL: Overweight woman, spry, age-appropriate, fully ambulatory, mild conversational dyspnea. HEAD: Normocephalic, atraumatic.  EYES: Pupils equal, round, reactive to light.  No scleral icterus.  MOUTH: Oral mucosa moist, no thrush. NECK: Supple. No thyromegaly. Trachea midline. No JVD.  No adenopathy.  Faint bilateral carotid bruits. PULMONARY: Good air entry bilaterally.  Faint end expiratory wheezes noted.Marland Kitchen CARDIOVASCULAR: S1 and S2. Regular rate and rhythm.  Has a grade 1/6 systolic ejection  murmur in the mitral position. ABDOMEN: Slightly protuberant otherwise benign MUSCULOSKELETAL: No joint deformity, no clubbing, no edema.  NEUROLOGIC: No overt focal deficit, no gait disturbance, speech is fluent. SKIN: Intact,warm,dry. PSYCH: Mood and behavior normal.   Ambulatory oximetry was performed today: At rest on room air oxygen saturation was 94%, the patient ambulated at a moderate pace, completed 3 laps, O2 nadir 92 to 93%, mod A at shortness of breath.  Resting heart rate was 87 bpm at maximum for this exercise 124 bpm.  Assessment & Plan:     ICD-10-CM   1. Chronic fatigue  R53.82 CBC w/Diff    Rheumatoid Factor    Sedimentation rate    Antinuclear Antib (ANA)    HLA-B27 Antigen    C-reactive protein   Query RA/polymyalgia rheumatica Query other connective tissue disease Symptom is out of proportion to very minimal lung findings on PFTs    2. Aneurysm of ascending aorta without rupture [facility])  I71.21    This issue adds complexity to her management Follows with Dr. Dorris Fetch    3. Mild persistent asthma without complication  J45.30    Continue Trelegy Ellipta Continue as needed albuterol    4. Thoracic back pain, unspecified back pain laterality, unspecified chronicity  M54.6 HLA-B27 Antigen   Checking for rheumatic diseases    5. Arthralgia, unspecified joint  M25.50 CBC w/Diff    Rheumatoid Factor    Sedimentation rate    Antinuclear Antib (ANA)    HLA-B27 Antigen    C-reactive protein   Checking for rheumatic diseases    6. OSA (obstructive sleep apnea)  G47.33    Continue AVAPS Patient compliant with therapy Notes benefit of therapy     Orders Placed This Encounter  Procedures   CBC w/Diff    Standing Status:   Future    Number of Occurrences:   1    Standing Expiration Date:   12/07/2022   Rheumatoid Factor    Standing Status:   Future    Number of Occurrences:   1    Standing Expiration Date:   09/06/2023   Sedimentation rate    Standing  Status:   Future    Number of Occurrences:   1    Standing Expiration Date:   09/06/2023  Antinuclear Antib (ANA)    Standing Status:   Future    Number of Occurrences:   1    Standing Expiration Date:   12/07/2022   HLA-B27 Antigen    Standing Status:   Future    Number of Occurrences:   1    Standing Expiration Date:   12/07/2022   C-reactive protein    Standing Status:   Future    Number of Occurrences:   1    Standing Expiration Date:   12/07/2022    I assured the patient that she does not have significant lung disease to cause all of the symptoms she is having.  She seems frustrated because she cannot figure out why she is so fatigued.  I suspect that there is a big component of deconditioning but she is somewhat reluctant to endorse this assessment.  Will see the patient back in 6 to 8 weeks time she is to contact us prior to that time should any new difficulties arise.    Gailen Shelter, MD Advanced Bronchoscopy PCCM [facility]    *This note was dictated using voice recognition software/Dragon.  Despite best efforts to proofread, errors can occur which can change the meaning. Any transcriptional errors that result from this process are unintentional and may not be fully corrected at the time of dictation.

## 2022-09-06 NOTE — Patient Instructions (Signed)
I have ordered some blood work to see if we can determine what is causing your fatigue.  Your lungs are working well and are not responsible for the symptoms you are feeling.  We will see you in follow-up in 6 to 8 weeks time call sooner should any new problems arise.  We will call you with the results of the blood test as they come back.

## 2022-09-07 ENCOUNTER — Other Ambulatory Visit
Admission: RE | Admit: 2022-09-07 | Discharge: 2022-09-07 | Disposition: A | Discharge status: Home / Self Care | Payer: Medicare PPO | Attending: Pulmonary Disease | Admitting: Pulmonary Disease

## 2022-09-07 DIAGNOSIS — M2550 Pain in unspecified joint: Secondary | ICD-10-CM | POA: Insufficient documentation

## 2022-09-07 DIAGNOSIS — R5382 Chronic fatigue, unspecified: Secondary | ICD-10-CM | POA: Insufficient documentation

## 2022-09-07 DIAGNOSIS — M546 Pain in thoracic spine: Secondary | ICD-10-CM | POA: Insufficient documentation

## 2022-09-07 LAB — CBC WITH DIFFERENTIAL/PLATELET
Abs Immature Granulocytes: 0.12 10*3/uL — ABNORMAL HIGH (ref 0.00–0.07)
Basophils Absolute: 0.1 10*3/uL (ref 0.0–0.1)
Basophils Relative: 1 %
Eosinophils Absolute: 0.3 10*3/uL (ref 0.0–0.5)
Eosinophils Relative: 3 %
HCT: 40.1 % (ref 36.0–46.0)
Hemoglobin: 12.9 g/dL (ref 12.0–15.0)
Immature Granulocytes: 1 %
Lymphocytes Relative: 20 %
Lymphs Abs: 2.1 10*3/uL (ref 0.7–4.0)
MCH: 28.4 pg (ref 26.0–34.0)
MCHC: 32.2 g/dL (ref 30.0–36.0)
MCV: 88.1 fL (ref 80.0–100.0)
Monocytes Absolute: 0.8 10*3/uL (ref 0.1–1.0)
Monocytes Relative: 8 %
Neutro Abs: 7 10*3/uL (ref 1.7–7.7)
Neutrophils Relative %: 67 %
Platelets: 341 10*3/uL (ref 150–400)
RBC: 4.55 MIL/uL (ref 3.87–5.11)
RDW: 15.5 % (ref 11.5–15.5)
WBC: 10.4 10*3/uL (ref 4.0–10.5)
nRBC: 0 % (ref 0.0–0.2)

## 2022-09-07 LAB — C-REACTIVE PROTEIN: CRP: 1.9 mg/dL — ABNORMAL HIGH (ref <1.0)

## 2022-09-07 LAB — SEDIMENTATION RATE: Sed Rate: 27 mm/hr (ref 0–30)

## 2022-09-08 ENCOUNTER — Encounter: Payer: Self-pay | Admitting: Pulmonary Disease

## 2022-09-09 LAB — RHEUMATOID FACTOR: Rheumatoid fact SerPl-aCnc: 18.3 [IU]/mL — ABNORMAL HIGH (ref <14.0)

## 2022-09-10 LAB — ANA: Anti Nuclear Antibody (ANA): NEGATIVE

## 2022-09-11 ENCOUNTER — Other Ambulatory Visit: Payer: Self-pay | Admitting: Family Medicine

## 2022-09-11 DIAGNOSIS — Z1231 Encounter for screening mammogram for malignant neoplasm of breast: Secondary | ICD-10-CM

## 2022-09-12 LAB — HLA-B27 ANTIGEN: HLA-B27: NEGATIVE

## 2022-09-13 ENCOUNTER — Ambulatory Visit
Admission: RE | Admit: 2022-09-13 | Discharge: 2022-09-13 | Disposition: A | Discharge status: Home / Self Care | Payer: Medicare PPO | Source: Ambulatory Visit | Attending: Family Medicine | Admitting: Family Medicine

## 2022-09-13 DIAGNOSIS — Z1231 Encounter for screening mammogram for malignant neoplasm of breast: Secondary | ICD-10-CM | POA: Insufficient documentation

## 2022-09-18 DIAGNOSIS — G4733 Obstructive sleep apnea (adult) (pediatric): Secondary | ICD-10-CM | POA: Diagnosis not present

## 2022-09-21 ENCOUNTER — Other Ambulatory Visit: Payer: Self-pay | Admitting: Thoracic Surgery (Cardiothoracic Vascular Surgery)

## 2022-09-27 ENCOUNTER — Other Ambulatory Visit: Payer: Self-pay | Admitting: Family Medicine

## 2022-09-27 DIAGNOSIS — G4733 Obstructive sleep apnea (adult) (pediatric): Secondary | ICD-10-CM | POA: Diagnosis not present

## 2022-09-27 NOTE — Telephone Encounter (Signed)
Requested medication (s) are due for refill today: not until 10/05/22  Requested medication (s) are on the active medication list:yes  Last refill:  09/04/22  Future visit scheduled: NO  Notes to clinic:  Sent message to pt via MyChart to call office to make appt for refills   Requested Prescriptions  Pending Prescriptions Disp Refills   Venlafaxine HCl 225 MG TB24 [Pharmacy Med Name: VENLAFAXINE HCL ER 225 MG TAB] 90 tablet 1    Sig: TAKE 1 TABLET (225 MG TOTAL) BY MOUTH DAILY. PLEASE SCHEDULE OFFICE VISIT BEFORE ANY FUTURE REFILLS     Psychiatry: Antidepressants - SNRI - desvenlafaxine & venlafaxine Failed - 09/27/2022 11:30 AM      Failed - Lipid Panel in normal range within the last 12 months    Cholesterol, Total  Date Value Ref Range Status  03/28/2021 257 (H) 100 - 199 mg/dL Final   Cholesterol  Date Value Ref Range Status  05/04/2021 189 0 - 200 mg/dL Final   LDL Chol Calc (NIH)  Date Value Ref Range Status  03/28/2021 132 (H) 0 - 99 mg/dL Final   LDL Cholesterol  Date Value Ref Range Status  05/04/2021 104 (H) 0 - 99 mg/dL Final    Comment:           Total Cholesterol/HDL:CHD Risk Coronary Heart Disease Risk Table                     Men   Women  1/2 Average Risk   3.4   3.3  Average Risk       5.0   4.4  2 X Average Risk   9.6   7.1  3 X Average Risk  23.4   11.0        Use the calculated Patient Ratio above and the CHD Risk Table to determine the patient's CHD Risk.        ATP III CLASSIFICATION (LDL):  <100     mg/dL   Optimal  161-096  mg/dL   Near or Above                    Optimal  130-159  mg/dL   Borderline  045-409  mg/dL   High  >811     mg/dL   Very High Performed at [facility], [address] [place], [place] [address]    HDL  Date Value Ref Range Status  05/04/2021 40 (L) >40 mg/dL Final  29/56/2130 41 >86 mg/dL Final   Triglycerides  Date Value Ref Range Status  05/04/2021 224 (H) <150 mg/dL Final         Passed -  Cr in normal range and within 360 days    Creatinine, Ser  Date Value Ref Range Status  05/18/2022 0.91 0.57 - 1.00 mg/dL Final         Passed - Completed PHQ-2 or PHQ-9 in the last 360 days      Passed - Last BP in normal range    BP Readings from Last 1 Encounters:  09/06/22 124/84         Passed - Valid encounter within last 6 months    Recent Outpatient Visits           4 months ago Hypertension associated with diabetes [facility])   [place] [facility], [place], PA-C   4 months ago Hypertension associated with diabetes [facility])   [place] [facility] [place], [place]  M, MD   1 year ago Hypertension associated with diabetes [facility])   [place] [facility] [place], Darl Householder, DO   1 year ago Community acquired pneumonia of left lung, unspecified part of lung   [place] [facility] [place], Marzella Schlein, MD   1 year ago Encounter for annual physical exam   [facility] [place], Marzella Schlein, MD

## 2022-10-02 ENCOUNTER — Ambulatory Visit: Payer: Medicare PPO | Admitting: Thoracic Surgery (Cardiothoracic Vascular Surgery)

## 2022-10-02 ENCOUNTER — Other Ambulatory Visit: Payer: Medicare PPO

## 2022-10-02 VITALS — BP 166/89 | HR 91 | Resp 18 | Ht 65.0 in | Wt 194.0 lb

## 2022-10-02 DIAGNOSIS — I7121 Aneurysm of the ascending aorta, without rupture: Secondary | ICD-10-CM

## 2022-10-02 MED ORDER — METOPROLOL SUCCINATE ER 50 MG PO TB24: 25.0000 mg | ORAL_TABLET | Freq: Every day | ORAL | 5 refills | Status: DC

## 2022-10-02 NOTE — Progress Notes (Signed)
[address]       [place] [address]             [phone]    HPI: Natasha Vasquez returns for scheduled follow-up regarding her ascending aneurysm.  Natasha "Natasha Vasquez" Vasquez is an 82 year old woman with a history of an ascending thoracic aneurysm, hypertension, hyperlipidemia, mitral prolapse, PAD, breast cancer, melanoma, reflux, Barrett's esophagitis, nephritis, arthritis, type 2 diabetes, obesity, depression, pneumonia, and chronic dyspnea.  She did follow-up for an ascending aneurysm since 2019.  Is measured around 4.6 cm.  She checks her blood pressure at home and says it has been elevated.  She has chronic dyspnea.  She is not having any chest pain, pressure, or tightness.  She again mentions stress related to her husband's diagnosis of dementia.  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  ER/PR NEGATIVE   Cataract    Chronic cough    Complication of anesthesia    20 years ago pts b/p dropped really low, "coded"   Depression    Depression    Diabetes mellitus without complication (HCC)    diet controlled   Family history of adverse reaction to anesthesia    son PONV   GERD (gastroesophageal reflux disease)    Gout    Hypercholesteremia    Hyperlipidemia    Hypertension    Melanoma (HCC)    MVP (mitral valve prolapse)    followed by PCP   Nephritis    Nephritis    S/P appendectomy    Thoracic aortic aneurysm (HCC)    Torn meniscus    left    Current Outpatient Medications  Medication Sig Dispense Refill   acetaminophen (TYLENOL) 500 MG tablet Take 500 mg by mouth every 6 (six) hours as needed.     albuterol (VENTOLIN HFA) 108 (90 Base) MCG/ACT inhaler TAKE 2 PUFFS BY MOUTH EVERY 6 HOURS AS NEEDED FOR WHEEZE OR SHORTNESS OF BREATH 8.5 each 2   allopurinol (ZYLOPRIM) 100 MG tablet Take 2  tablets (200 mg total) by mouth daily. 180 tablet 1   ARIPiprazole (ABILIFY) 2 MG tablet TAKE 1 TABLET BY MOUTH EVERY DAY 90 tablet 1   aspirin 81 MG tablet Take 81 mg by mouth daily.     bismuth subsalicylate (PEPTO BISMOL) 262 MG chewable tablet Chew 262 mg by mouth as needed. **capsule**     calcium carbonate (TUMS - DOSED IN MG ELEMENTAL CALCIUM) 500 MG chewable tablet Chew 1 tablet by mouth as needed for indigestion or heartburn.     carboxymethylcellulose (REFRESH PLUS) 0.5 % SOLN Place 1 drop into both eyes 3 (three) times daily as needed (Dry Eyes).     Cholecalciferol (VITAMIN D-3 PO) Take 1 tablet by mouth daily.     colestipol (COLESTID) 1 g tablet Take 1 tablet (1 g total) by mouth 2 (two) times daily. 60 tablet 2   famotidine (PEPCID) 20 MG tablet TAKE 1 TABLET BY MOUTH TWICE A DAY 180 tablet 0   ferrous sulfate 324 MG TBEC Take 324 mg by mouth daily with breakfast.     fluocinonide (LIDEX) 0.05 % external solution APPLY TO AFFECTED AREAS OF SCALP DAILY UNTIL CLEAR, THEN FOR FLARES     Fluticasone-Umeclidin-Vilant (TRELEGY ELLIPTA) 100-62.5-25 MCG/ACT AEPB Inhale  1 puff into the lungs daily. 60 each 11   furosemide (LASIX) 20 MG tablet TAKE 1 TABLET BY MOUTH EVERY DAY 90 tablet 3   lisinopril (ZESTRIL) 10 MG tablet Take 1 tablet (10 mg total) by mouth daily. 90 tablet 1   lisinopril (ZESTRIL) 5 MG tablet Take 1 tablet (5 mg total) by mouth daily. 90 tablet 0   Multiple Vitamins-Minerals (CENTRUM SILVER PO) Take 1 tablet by mouth daily.     omeprazole (PRILOSEC) 40 MG capsule Take 1 capsule (40 mg total) by mouth 2 (two) times daily. 180 capsule 0   oxybutynin (DITROPAN-XL) 10 MG 24 hr tablet TAKE 1 TABLET BY MOUTH EVERY DAY 90 tablet 1   potassium chloride (KLOR-CON) 10 MEQ tablet TAKE 1 TABLET BY MOUTH EVERY DAY 90 tablet 3   rosuvastatin (CRESTOR) 10 MG tablet TAKE 1 TABLET BY MOUTH EVERY DAY 90 tablet 0   Venlafaxine HCl 225 MG TB24 TAKE 1 TABLET (225 MG TOTAL) BY MOUTH DAILY.  PLEASE SCHEDULE OFFICE VISIT BEFORE ANY FUTURE REFILLS 90 tablet 1   metoprolol succinate (TOPROL XL) 50 MG 24 hr tablet Take 0.5 tablets (25 mg total) by mouth daily. 30 tablet 5   No current facility-administered medications for this visit.    Physical Exam BP (!) 166/89 (BP Location: Left Arm, Patient Position: Sitting)   Pulse 91   Resp 18   Ht 5\' 5"  (1.651 m)   Wt 194 lb (88 kg)   SpO2 91% Comment: RA  BMI 32.28 kg/m  Obese 82 year old woman in no acute distress Alert and oriented x 3 with no focal deficits Lungs diminished bilaterally but otherwise clear Cardiac regular rate and rhythm with a 2/6 systolic murmur No peripheral edema  Diagnostic Tests: CT ANGIOGRAPHY CHEST WITH CONTRAST   TECHNIQUE: Multidetector CT imaging of the chest was performed using the standard protocol during bolus administration of intravenous contrast. Multiplanar CT image reconstructions and MIPs were obtained to evaluate the vascular anatomy.   RADIATION DOSE REDUCTION: This exam was performed according to the departmental dose-optimization program which includes automated exposure control, adjustment of the mA and/or kV according to patient size and/or use of iterative reconstruction technique.   CONTRAST:  75mL ISOVUE-370 IOPAMIDOL (ISOVUE-370) INJECTION 76%   COMPARISON:  03/09/2022, 06/01/2021, 05/04/2021, most remote 01/23/2018   FINDINGS: Cardiovascular:   Heart:   Heart size borderline enlarged. No pericardial fluid/thickening. Calcifications of left main coronary artery.   Aorta:   No significant aortic valve calcifications.   Greatest estimated diameter of the aortic annulus 24 mm on the coronal reformatted images.   Greatest estimated diameter of the sino-tubular junction, 30 mm on the coronal images.   Greatest estimated diameter of the ascending aorta on the axial images, 47 mm.   Greatest estimated diameter on the most remote comparison of 01/23/2018 is  estimated 44 mm.   Diameter estimated 03/09/2022 is 48 mm.   Mild atherosclerotic changes of the aorta. Branch vessels are patent with a 3 vessel arch. Cervical cerebral vessels patent at the base of the neck.   No pedunculated plaque, ulcerated plaque, dissection, periaortic fluid. No wall thickening.   Pulmonary arteries:   Timing of the contrast bolus is not optimized for evaluation of pulmonary artery filling defects. Unremarkable size of the main pulmonary artery.   Mediastinum/Nodes: No mediastinal adenopathy. Unremarkable appearance of the thoracic esophagus.   Unremarkable appearance of the thoracic inlet.   Lungs/Pleura: Central airways are clear. No pleural effusion. No confluent airspace  disease.   No pneumothorax.   Upper Abdomen: Hiatal hernia. No acute finding of the upper abdomen.   Musculoskeletal: No acute displaced fracture. Degenerative changes of the spine. Right breast reconstruction.   Review of the MIP images confirms the above findings.   IMPRESSION: Relatively unchanged size and configuration of the ascending aorta when compared to the most recent CT, estimated 4.7 cm on the current. Ascending thoracic aortic aneurysm. Recommend semi-annual imaging followup by CTA or MRA and referral to cardiothoracic surgery if not already obtained. This recommendation follows 2010 ACCF/AHA/AATS/ACR/ASA/SCA/SCAI/SIR/STS/SVM Guidelines for the Diagnosis and Management of Patients With Thoracic Aortic Disease. Circulation. 2010; 121: Z610-R604. Aortic aneurysm NOS (ICD10-I71.9)   No acute CT finding.   Aortic Atherosclerosis (ICD10-I70.0). Associated coronary artery disease.   Signed,   Yvone Neu. Miachel Roux, RPVI   [facility]   [facility]     Electronically Signed   By: Gilmer Mor D.O.   On: 08/14/2022 11:48 I personally reviewed the CT images.  4.7 cm ascending aneurysm, stable.  Aortic and  coronary atherosclerosis.  Impression: Juliett "Natasha Vasquez" Kapsner is an 82 year old woman with a history of an ascending thoracic aneurysm, hypertension, hyperlipidemia, mitral prolapse, PAD, breast cancer, melanoma, reflux, Barrett's esophagitis, nephritis, arthritis, type 2 diabetes, obesity, depression, pneumonia, and chronic dyspnea.  Ascending aortic aneurysm-stable at 4.7 cm.  Minimal change over past 5 years.  Needs continued semiannual follow-up.  Emphasized the importance of blood pressure control.  Hypertension-blood pressure elevated at 166/97 today.  She says that she does check it at home and it has been elevated recently.  She has 2 different doses of lisinopril listed in her medications.  Either 5 or 10 mg.  She is going to check when she gets home and let us know which dose she is taking.  I recommended that we increase her metoprolol to 50 mg daily and placed a prescription for that.  Dyspnea-no obvious cardiac source.  She does have a heart murmur but echocardiogram recently showed no evidence of aortic stenosis or insufficiency and she had preserved left ventricular function.  Pulmonary function was also unremarkable.  Being evaluated by Dr. Jayme Cloud. Plan: Increase Toprol XL to 50 mg daily She will check on her lisinopril dose and let us know which dosing is correct Follow-up with Dr. Jayme Cloud regarding dyspnea Return in 6 months with CT chest to follow-up ascending aneurysm.  Loreli Slot, MD [facility] [phone]

## 2022-10-04 ENCOUNTER — Other Ambulatory Visit: Payer: Self-pay | Admitting: Family Medicine

## 2022-10-08 ENCOUNTER — Other Ambulatory Visit: Payer: Self-pay | Admitting: Family Medicine

## 2022-10-08 DIAGNOSIS — I10 Essential (primary) hypertension: Secondary | ICD-10-CM

## 2022-10-08 NOTE — Telephone Encounter (Signed)
Requested Prescriptions  Pending Prescriptions Disp Refills   famotidine (PEPCID) 20 MG tablet [Pharmacy Med Name: FAMOTIDINE 20 MG TABLET] 180 tablet 1    Sig: TAKE 1 TABLET BY MOUTH TWICE A DAY     Gastroenterology:  H2 Antagonists Passed - 10/04/2022  3:47 PM      Passed - Valid encounter within last 12 months    Recent Outpatient Visits           4 months ago Hypertension associated with diabetes [facility])   [place] [facility] [place], [place], PA-C   5 months ago Hypertension associated with diabetes [facility])   [place] [facility] [place], Marzella Schlein, MD   1 year ago Hypertension associated with diabetes [facility])   [place] [facility] Caro [place], DO   1 year ago Community acquired pneumonia of left lung, unspecified part of lung   [place] [facility] [place], Marzella Schlein, MD   1 year ago Encounter for annual physical exam   [facility] [place], Marzella Schlein, MD

## 2022-10-09 ENCOUNTER — Other Ambulatory Visit: Payer: Self-pay | Admitting: Family Medicine

## 2022-10-09 DIAGNOSIS — I10 Essential (primary) hypertension: Secondary | ICD-10-CM

## 2022-10-09 MED ORDER — LISINOPRIL 10 MG PO TABS
10.0000 mg | ORAL_TABLET | Freq: Every day | ORAL | 0 refills | Status: DC
Start: 2022-10-09 — End: 2023-01-29

## 2022-10-09 NOTE — Telephone Encounter (Signed)
Please review Note from 05/18/2022 Meds were cautiously increased to lisinopril 10 mg.

## 2022-10-25 ENCOUNTER — Other Ambulatory Visit: Payer: Self-pay | Admitting: Physician Assistant

## 2022-10-25 ENCOUNTER — Other Ambulatory Visit: Payer: Self-pay | Admitting: Family Medicine

## 2022-10-25 ENCOUNTER — Other Ambulatory Visit: Payer: Self-pay | Admitting: Thoracic Surgery (Cardiothoracic Vascular Surgery)

## 2022-10-25 DIAGNOSIS — E1169 Type 2 diabetes mellitus with other specified complication: Secondary | ICD-10-CM

## 2022-10-25 DIAGNOSIS — F4322 Adjustment disorder with anxiety: Secondary | ICD-10-CM

## 2022-10-25 DIAGNOSIS — N3281 Overactive bladder: Secondary | ICD-10-CM

## 2022-10-25 DIAGNOSIS — M1A9XX0 Chronic gout, unspecified, without tophus (tophi): Secondary | ICD-10-CM

## 2022-10-25 DIAGNOSIS — I10 Essential (primary) hypertension: Secondary | ICD-10-CM

## 2022-10-25 NOTE — Telephone Encounter (Signed)
Requested Prescriptions  Pending Prescriptions Disp Refills   omeprazole (PRILOSEC) 40 MG capsule [Pharmacy Med Name: OMEPRAZOLE DR 40 MG CAPSULE] 180 capsule 0    Sig: TAKE 1 CAPSULE BY MOUTH TWICE A DAY     Gastroenterology: Proton Pump Inhibitors Passed - 10/25/2022  4:45 PM      Passed - Valid encounter within last 12 months    Recent Outpatient Visits           5 months ago Hypertension associated with diabetes [facility])   [employer] [facility] [place], [place], PA-C   5 months ago Hypertension associated with diabetes [facility])   [place] [facility] [place], Marzella Schlein, MD   1 year ago Hypertension associated with diabetes [facility])   [place] [facility] Caro Lar[place], DO   1 year ago Community acquired pneumonia of left lung, unspecified part of lung   [place] [facility] [place], Marzella Schlein, MD   1 year ago Encounter for annual physical exam   [place] [place], Marzella Schlein, MD

## 2022-10-25 NOTE — Telephone Encounter (Signed)
Requested medication (s) are due for refill today: yes except lisinopril  Requested medication (s) are on the active medication list: yes  Last refill:  see med list, multiple rxs  Future visit scheduled: no  Notes to clinic:  Unable to refill per protocol due to failed labs, no updated results.  Lisinopril 10mg  is on pt med list, request was for 5mg . Please review     Requested Prescriptions  Pending Prescriptions Disp Refills   rosuvastatin (CRESTOR) 10 MG tablet [Pharmacy Med Name: ROSUVASTATIN CALCIUM 10 MG TAB] 90 tablet 0    Sig: TAKE 1 TABLET BY MOUTH EVERY DAY     Cardiovascular:  Antilipid - Statins 2 Failed - 10/25/2022  4:45 PM      Failed - Lipid Panel in normal range within the last 12 months    Cholesterol, Total  Date Value Ref Range Status  03/28/2021 257 (H) 100 - 199 mg/dL Final   Cholesterol  Date Value Ref Range Status  05/04/2021 189 0 - 200 mg/dL Final   LDL Chol Calc (NIH)  Date Value Ref Range Status  03/28/2021 132 (H) 0 - 99 mg/dL Final   LDL Cholesterol  Date Value Ref Range Status  05/04/2021 104 (H) 0 - 99 mg/dL Final    Comment:           Total Cholesterol/HDL:CHD Risk Coronary Heart Disease Risk Table                     Men   Women  1/2 Average Risk   3.4   3.3  Average Risk       5.0   4.4  2 X Average Risk   9.6   7.1  3 X Average Risk  23.4   11.0        Use the calculated Patient Ratio above and the CHD Risk Table to determine the patient's CHD Risk.        ATP III CLASSIFICATION (LDL):  <100     mg/dL   Optimal  161-096  mg/dL   Near or Above                    Optimal  130-159  mg/dL   Borderline  045-409  mg/dL   High  >811     mg/dL   Very High Performed at [facility], [address] [place], [place] [address]    HDL  Date Value Ref Range Status  05/04/2021 40 (L) >40 mg/dL Final  29/56/2130 41 >86 mg/dL Final   Triglycerides  Date Value Ref Range Status  05/04/2021 224 (H) <150 mg/dL Final          Passed - Cr in normal range and within 360 days    Creatinine, Ser  Date Value Ref Range Status  05/18/2022 0.91 0.57 - 1.00 mg/dL Final         Passed - Patient is not pregnant      Passed - Valid encounter within last 12 months    Recent Outpatient Visits           5 months ago Hypertension associated with diabetes [facility])   [place] [facility] [place], [place], PA-C   5 months ago Hypertension associated with diabetes [facility])   [place] [facility] [place], Marzella Schlein, MD   1 year ago Hypertension associated with diabetes [facility])   [place] [facility] Caro Laroche, DO  1 year ago Community acquired pneumonia of left lung, unspecified part of lung   [place] [facility] [place], Marzella Schlein, MD   1 year ago Encounter for annual physical exam   [place] [facility] [place], Marzella Schlein, MD               ARIPiprazole (ABILIFY) 2 MG tablet [Pharmacy Med Name: ARIPIPRAZOLE 2 MG TABLET] 90 tablet 1    Sig: TAKE 1 TABLET BY MOUTH EVERY DAY     Not Delegated - Psychiatry:  Antipsychotics - Second Generation (Atypical) - aripiprazole Failed - 10/25/2022  4:45 PM      Failed - This refill cannot be delegated      Failed - Last BP in normal range    BP Readings from Last 1 Encounters:  10/02/22 (!) 166/89         Failed - Lipid Panel in normal range within the last 12 months    Cholesterol, Total  Date Value Ref Range Status  03/28/2021 257 (H) 100 - 199 mg/dL Final   Cholesterol  Date Value Ref Range Status  05/04/2021 189 0 - 200 mg/dL Final   LDL Chol Calc (NIH)  Date Value Ref Range Status  03/28/2021 132 (H) 0 - 99 mg/dL Final   LDL Cholesterol  Date Value Ref Range Status  05/04/2021 104 (H) 0 - 99 mg/dL Final    Comment:           Total Cholesterol/HDL:CHD Risk Coronary Heart Disease Risk Table                     Men   Women  1/2 Average Risk   3.4   3.3   Average Risk       5.0   4.4  2 X Average Risk   9.6   7.1  3 X Average Risk  23.4   11.0        Use the calculated Patient Ratio above and the CHD Risk Table to determine the patient's CHD Risk.        ATP III CLASSIFICATION (LDL):  <100     mg/dL   Optimal  161-096  mg/dL   Near or Above                    Optimal  130-159  mg/dL   Borderline  045-409  mg/dL   High  >811     mg/dL   Very High Performed at [facility], [address] [place], [place] [address]    HDL  Date Value Ref Range Status  05/04/2021 40 (L) >40 mg/dL Final  29/56/2130 41 >86 mg/dL Final   Triglycerides  Date Value Ref Range Status  05/04/2021 224 (H) <150 mg/dL Final         Passed - TSH in normal range and within 360 days    TSH  Date Value Ref Range Status  05/18/2022 3.210 0.450 - 4.500 uIU/mL Final         Passed - Completed PHQ-2 or PHQ-9 in the last 360 days      Passed - Last Heart Rate in normal range    Pulse Readings from Last 1 Encounters:  10/02/22 91         Passed - Valid encounter within last 6 months    Recent Outpatient Visits           5 months ago Hypertension associated with  diabetes [facility])   [place] [facility] Alfredia Ferguson, PA-C   5 months ago Hypertension associated with diabetes [facility])   [place] [facility] [place], Marzella Schlein, MD   1 year ago Hypertension associated with diabetes [facility])   [place] [facility] Caro Laroche, DO   1 year ago Community acquired pneumonia of left lung, unspecified part of lung   [place] [facility] [place], Marzella Schlein, MD   1 year ago Encounter for annual physical exam   [place] [facility] [place], Marzella Schlein, MD              Passed - CBC within normal limits and completed in the last 12 months    WBC  Date Value Ref Range Status  09/07/2022 10.4 4.0 - 10.5 K/uL Final   RBC  Date Value Ref Range Status   09/07/2022 4.55 3.87 - 5.11 MIL/uL Final   Hemoglobin  Date Value Ref Range Status  09/07/2022 12.9 12.0 - 15.0 g/dL Final  16/10/9602 54.0 (L) 11.1 - 15.9 g/dL Final   HCT  Date Value Ref Range Status  09/07/2022 40.1 36.0 - 46.0 % Final   Hematocrit  Date Value Ref Range Status  09/17/2018 33.0 (L) 34.0 - 46.6 % Final   MCHC  Date Value Ref Range Status  09/07/2022 32.2 30.0 - 36.0 g/dL Final   [facility]  Date Value Ref Range Status  09/07/2022 28.4 26.0 - 34.0 pg Final   MCV  Date Value Ref Range Status  09/07/2022 88.1 80.0 - 100.0 fL Final  09/17/2018 78 (L) 79 - 97 fL Final   No results found for: "PLTCOUNTKUC", "LABPLAT", "POCPLA" RDW  Date Value Ref Range Status  09/07/2022 15.5 11.5 - 15.5 % Final  09/17/2018 16.5 (H) 11.7 - 15.4 % Final         Passed - CMP within normal limits and completed in the last 12 months    Albumin  Date Value Ref Range Status  05/18/2022 4.6 3.7 - 4.7 g/dL Final   Alkaline Phosphatase  Date Value Ref Range Status  05/18/2022 102 44 - 121 IU/L Final   ALT  Date Value Ref Range Status  05/18/2022 21 0 - 32 IU/L Final   AST  Date Value Ref Range Status  05/18/2022 21 0 - 40 IU/L Final   BUN  Date Value Ref Range Status  05/18/2022 16 8 - 27 mg/dL Final   Calcium  Date Value Ref Range Status  05/18/2022 9.8 8.7 - 10.3 mg/dL Final   CO2  Date Value Ref Range Status  05/18/2022 24 20 - 29 mmol/L Final   Creatinine, Ser  Date Value Ref Range Status  05/18/2022 0.91 0.57 - 1.00 mg/dL Final   Glucose  Date Value Ref Range Status  05/18/2022 84 70 - 99 mg/dL Final   Glucose, Bld  Date Value Ref Range Status  05/08/2021 104 (H) 70 - 99 mg/dL Final    Comment:    Glucose reference range applies only to samples taken after fasting for at least 8 hours.   Glucose-Capillary  Date Value Ref Range Status  05/08/2021 99 70 - 99 mg/dL Final    Comment:    Glucose reference range applies only to samples taken after  fasting for at least 8 hours.   Potassium  Date Value Ref Range Status  05/18/2022 4.0 3.5 - 5.2 mmol/L Final   Sodium  Date Value Ref Range  Status  05/18/2022 142 134 - 144 mmol/L Final   Bilirubin Total  Date Value Ref Range Status  05/18/2022 0.2 0.0 - 1.2 mg/dL Final   Protein, ur  Date Value Ref Range Status  10/07/2018 NEGATIVE NEGATIVE mg/dL Final   Total Protein  Date Value Ref Range Status  05/18/2022 6.8 6.0 - 8.5 g/dL Final   GFR calc Af Amer  Date Value Ref Range Status  08/05/2019 55 (L) >59 mL/min/1.73 Final    Comment:    **[facility] currently reports eGFR in compliance with the current**   recommendations of the [employer]. [facility] will   update reporting as new guidelines are published from the NKF-ASN   Task force.    eGFR  Date Value Ref Range Status  05/18/2022 63 >59 mL/min/1.73 Final   GFR, Estimated  Date Value Ref Range Status  05/08/2021 >60 >60 mL/min Final    Comment:    (NOTE) Calculated using the CKD-EPI Creatinine Equation (2021)           lisinopril (ZESTRIL) 5 MG tablet [Pharmacy Med Name: LISINOPRIL 5 MG TABLET] 90 tablet 0    Sig: Take 1 tablet (5 mg total) by mouth daily.     Cardiovascular:  ACE Inhibitors Failed - 10/25/2022  4:45 PM      Failed - Last BP in normal range    BP Readings from Last 1 Encounters:  10/02/22 (!) 166/89         Passed - Cr in normal range and within 180 days    Creatinine, Ser  Date Value Ref Range Status  05/18/2022 0.91 0.57 - 1.00 mg/dL Final         Passed - K in normal range and within 180 days    Potassium  Date Value Ref Range Status  05/18/2022 4.0 3.5 - 5.2 mmol/L Final         Passed - Patient is not pregnant      Passed - Valid encounter within last 6 months    Recent Outpatient Visits           5 months ago Hypertension associated with diabetes [facility])   [place] [facility] [place], [place], PA-C   5 months ago Hypertension associated  with diabetes [facility])   [place] [facility] [place], Marzella Schlein, MD   1 year ago Hypertension associated with diabetes [facility])   [place] [facility] Caro Laroche, DO   1 year ago Community acquired pneumonia of left lung, unspecified part of lung   [place] [facility] [place], Marzella Schlein, MD   1 year ago Encounter for annual physical exam   [place] [facility] [place], Marzella Schlein, MD               colestipol (COLESTID) 1 g tablet [Pharmacy Med Name: COLESTIPOL HCL 1 GM TABLET] 180 tablet     Sig: TAKE 1 TABLET BY MOUTH 2 TIMES DAILY.     Cardiovascular:  Antilipid - Bile Acid Sequestrants Failed - 10/25/2022  4:45 PM      Failed - Lipid Panel in normal range within the last 12 months    Cholesterol, Total  Date Value Ref Range Status  03/28/2021 257 (H) 100 - 199 mg/dL Final   Cholesterol  Date Value Ref Range Status  05/04/2021 189 0 - 200 mg/dL Final   LDL Chol Calc (NIH)  Date Value Ref Range Status  03/28/2021 132 (H) 0 -  99 mg/dL Final   LDL Cholesterol  Date Value Ref Range Status  05/04/2021 104 (H) 0 - 99 mg/dL Final    Comment:           Total Cholesterol/HDL:CHD Risk Coronary Heart Disease Risk Table                     Men   Women  1/2 Average Risk   3.4   3.3  Average Risk       5.0   4.4  2 X Average Risk   9.6   7.1  3 X Average Risk  23.4   11.0        Use the calculated Patient Ratio above and the CHD Risk Table to determine the patient's CHD Risk.        ATP III CLASSIFICATION (LDL):  <100     mg/dL   Optimal  308-657  mg/dL   Near or Above                    Optimal  130-159  mg/dL   Borderline  846-962  mg/dL   High  >952     mg/dL   Very High Performed at [facility], [address] [place], [place] [address]    HDL  Date Value Ref Range Status  05/04/2021 40 (L) >40 mg/dL Final  44/01/270 41 >53 mg/dL Final   Triglycerides  Date Value Ref  Range Status  05/04/2021 224 (H) <150 mg/dL Final         Passed - Valid encounter within last 12 months    Recent Outpatient Visits           5 months ago Hypertension associated with diabetes [facility])   [place] [facility] Alfredia Ferguson, PA-C   5 months ago Hypertension associated with diabetes [facility])   [place] [facility] [place], Marzella Schlein, MD   1 year ago Hypertension associated with diabetes [facility])   [place] [facility] Caro Laroche, DO   1 year ago Community acquired pneumonia of left lung, unspecified part of lung   [place] [facility] [place], Marzella Schlein, MD   1 year ago Encounter for annual physical exam   [place] [facility] [place], Marzella Schlein, MD

## 2022-10-27 DIAGNOSIS — G4733 Obstructive sleep apnea (adult) (pediatric): Secondary | ICD-10-CM | POA: Diagnosis not present

## 2022-11-15 DIAGNOSIS — Z85820 Personal history of malignant melanoma of skin: Secondary | ICD-10-CM | POA: Diagnosis not present

## 2022-11-15 DIAGNOSIS — D2372 Other benign neoplasm of skin of left lower limb, including hip: Secondary | ICD-10-CM | POA: Diagnosis not present

## 2022-11-15 DIAGNOSIS — L821 Other seborrheic keratosis: Secondary | ICD-10-CM | POA: Diagnosis not present

## 2022-11-15 DIAGNOSIS — L820 Inflamed seborrheic keratosis: Secondary | ICD-10-CM | POA: Diagnosis not present

## 2022-11-15 DIAGNOSIS — Z85828 Personal history of other malignant neoplasm of skin: Secondary | ICD-10-CM | POA: Diagnosis not present

## 2022-11-15 DIAGNOSIS — L538 Other specified erythematous conditions: Secondary | ICD-10-CM | POA: Diagnosis not present

## 2022-11-15 DIAGNOSIS — Z08 Encounter for follow-up examination after completed treatment for malignant neoplasm: Secondary | ICD-10-CM | POA: Diagnosis not present

## 2022-11-15 DIAGNOSIS — L570 Actinic keratosis: Secondary | ICD-10-CM | POA: Diagnosis not present

## 2022-12-18 DIAGNOSIS — G4733 Obstructive sleep apnea (adult) (pediatric): Secondary | ICD-10-CM | POA: Diagnosis not present

## 2023-01-25 ENCOUNTER — Telehealth: Payer: Self-pay | Admitting: Pulmonary Disease

## 2023-01-25 ENCOUNTER — Ambulatory Visit: Payer: Medicare PPO | Admitting: Pulmonary Disease

## 2023-01-25 ENCOUNTER — Encounter: Payer: Self-pay | Admitting: Pulmonary Disease

## 2023-01-25 VITALS — BP 156/100 | HR 71 | Temp 97.9°F | Ht 65.0 in | Wt 199.0 lb

## 2023-01-25 DIAGNOSIS — J45909 Unspecified asthma, uncomplicated: Secondary | ICD-10-CM

## 2023-01-25 DIAGNOSIS — G4733 Obstructive sleep apnea (adult) (pediatric): Secondary | ICD-10-CM

## 2023-01-25 DIAGNOSIS — J4520 Mild intermittent asthma, uncomplicated: Secondary | ICD-10-CM

## 2023-01-25 DIAGNOSIS — G4730 Sleep apnea, unspecified: Secondary | ICD-10-CM | POA: Diagnosis not present

## 2023-01-25 DIAGNOSIS — K449 Diaphragmatic hernia without obstruction or gangrene: Secondary | ICD-10-CM

## 2023-01-25 DIAGNOSIS — R0609 Other forms of dyspnea: Secondary | ICD-10-CM

## 2023-01-25 DIAGNOSIS — K219 Gastro-esophageal reflux disease without esophagitis: Secondary | ICD-10-CM

## 2023-01-25 DIAGNOSIS — I7121 Aneurysm of the ascending aorta, without rupture: Secondary | ICD-10-CM

## 2023-01-25 NOTE — Telephone Encounter (Signed)
 Oximetry on the CPAP is okay.

## 2023-01-25 NOTE — Patient Instructions (Signed)
 VISIT SUMMARY:  Today, we discussed your ongoing health concerns, including your breathing difficulties, sleep apnea, and GERD. We reviewed your current treatments and made several recommendations to help manage your symptoms and improve your overall health.  YOUR PLAN:  -DYSPNEA ON EXERTION: Dyspnea on exertion means experiencing shortness of breath and fatigue during physical activity. This can be due to various factors such as deconditioning, weight, and possible heart issues. We recommend weight loss, using a rescue inhaler 20 minutes before walks, and considering pulmonary rehabilitation. We will also evaluate your heart rate during exertion and may refer you to a cardiologist for further evaluation, including a potential cardiac catheterization.  -ASTHMA: Asthma is a condition where your airways narrow and swell, causing breathing difficulties. You should use your rescue inhaler before walks and consider restarting Trelegy if it proves beneficial.  -COMPLEX SLEEP APNEA: Complex sleep apnea involves both obstructive and central sleep apnea, causing interrupted breathing during sleep. It's important to continue using your AVAPs device throughout the night to improve daytime alertness and prevent panic and dyspnea.  -GASTROESOPHAGEAL REFLUX DISEASE (GERD): GERD is a chronic condition where stomach acid flows back into the esophagus, causing discomfort and regurgitation. We recommend seeing a gastroenterologist for further evaluation and management to prevent potential chronic aspiration.  -HYPERTENSION: Hypertension is high blood pressure, which can increase the workload on your heart. It's important to follow up with your cardiologist to ensure your blood pressure is well-managed and to reduce cardiac stress.  -GENERAL HEALTH MAINTENANCE: We discussed the importance of weight loss, physical conditioning, and dietary interventions. Working with government social research officer to strengthen your core and  improve stamina will be beneficial. These steps are part of your pulmonary rehabilitation program.  INSTRUCTIONS:  Please schedule a follow-up visit in 6-8 weeks. Additionally, we will monitor your oxygen  levels during a walking test today.

## 2023-01-25 NOTE — Telephone Encounter (Signed)
 I sent the ONO order to [employer]. Tonya with [employer] sent a message back stating "Pt has an auto cpap unit which she is currently using. Will pull order for ONO to be done on regular pap. If there is something different that needs to be done let me know.

## 2023-01-25 NOTE — Progress Notes (Addendum)
 Subjective:    Patient ID: Natasha Vasquez, female    DOB: [birth year], 83 y.o.   MRN: [id number]  Patient Care Team: Myrla Jon HERO, MD as PCP - General (Family Medicine) Waddell Thom SAUNDERS, PA as Physician Assistant (Physician Assistant) Mardee, Lynwood SQUIBB, MD as Consulting Physician (Orthopedic Surgery) Dessa, Reyes ORN, MD (General Surgery) Dasher, Alm LABOR, MD (Dermatology) Rennie Cindy SAUNDERS, MD as Consulting Physician (Internal Medicine) Mittie Gaskin, MD as Referring Physician (Ophthalmology) Tamea Dedra CROME, MD as Consulting Physician (Pulmonary Disease) Kerrin Elspeth BROCKS, MD as Consulting Physician (Cardiothoracic Surgery) Sandria Selma LABOR, [facility] (Inactive) as Pharmacist (Pharmacist)  Chief Complaint  Patient presents with   Follow-up    Shortness of breath on exertion and wheezing.     BACKGROUND/INTERVAL:Natasha Vasquez is an 83 year old very remote former smoker with minimal tobacco history, who presents for follow-up on the issue of dyspnea on exertion that she expresses mostly as fatigue.  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  She was last seen here on 06 September 2022 this is a follow-up visit.   HPI Discussed the use of AI scribe software for clinical note transcription with the patient, who gave verbal consent to proceed.  History of Present Illness   The patient is an 83 year old with a history of complex sleep apnea, currently managed with auto CPAP. She reports an improvement in daytime fatigue since starting the auto CPAP, despite initial reluctance to use the device. However, she notes episodes of perceived breathlessness and panic if the device is removed during sleep.  The patient continues to experience dyspnea on exertion, described as fatigue. This is particularly noticeable during her regular walks, where she can complete two laps of a sixth of a mile before needing to rest due to 'huffing and puffing.' She  recovers quickly but describes these episodes as 'scary.'  Her husband who is with her today states that she does not appear to him as being in distress during these episodes.  However, he does have some issues with memory so history may not be reliable.  She also reports a sensation of lightheadedness and breathlessness when bending down, such as to tie her shoes.  The patient also describes episodes of breath-holding, particularly when drinking coffee, leading to a sensation of panic and feeling as though she is not getting enough oxygen . She also reports a sensation of her throat and voice 'closing up' when sitting, which she attributes to discomfort in her midsection. She also has a history of reflux, currently managed with omeprazole  and Pepcid . Despite this, she reports episodes of regurgitation with recognizable food particles.  The patient has engaged a systems analyst and is interested in strengthening her core and improving her stamina. She expresses frustration at her perceived limitations due to her breathing difficulties, including difficulty walking long distances and needing to rest frequently.   She has had difficulties with blood pressure control of late.  She does have a thoracic aneurysm that is being followed by Dr. Kerrin.  AVAPS compliance is at 96.6% for usage over 4 hours per night.  Despite this remains with an AHI of 7.   DATA: 04/03/2021 echocardiogram: LVEF 60 to 65%, no regional wall motion abnormalities.  Moderate LVH. Grade 1 DD.  RV function normal. 04/20/2021 PFTs: FEV1 1.95 L or 99% predicted, FVC 2.63 L or 102% predicted, FEV1/FVC 74%, no bronchodilator response.  Lung volumes normal with minimal air trapping.  Diffusion capacity mildly reduced.  Overall  normal study. 05/04/2021 CT angio chest: Left upper lobe and lingular pneumonia, no pulmonary embolism 06/01/2021 CT coronary: Coronary score 0, no overt coronary findings on lung windows, small hiatal  hernia. 08/18/2021 split-night sleep study: Consistent with severe obstructive sleep apnea and treatment emergent central sleep apnea, titration was not successful.  Eventually patient titrated to auto CPAP.  AHI was 90.5/h, O2 saturations as low as 78%. 06/01/2021 CT coronary: Normal coronary calcium  score of 0, mild to moderately dilated ascending aorta (known issue), no abnormalities on the pulmonary parenchyma visualized.  Small sliding hiatal hernia. 03/09/2022 CT chest contrast: Enlarging ascending thoracic aortic aneurysm measuring up to 4.8 cm, hepatic steatosis, small hiatal hernia, aortic atherosclerosis. 08/13/2022 CT chest with contrast: Unchanged size of thoracic aortic aneurysm estimated to be 4.7 cm concurrent study.  Central airways clear.  No pleural effusion.  No airspace disease.  Hiatal hernia present.  No other acute finding in the upper abdomen. 08/23/2022 PFTs: FEV1 1.64 L or 83% predicted, FVC 2.48 L or 93% predicted, FEV1/FVC 66%, diffusion capacity normal, lung volumes normal.  Normal airway obstruction presents suggesting small airways disease.  There is bronchodilator response.    Review of Systems A 10 point review of systems was performed and it is as noted above otherwise negative.   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 (HCC) 05/10/2021   CAP (community acquired pneumonia) 05/04/2021   HLD (hyperlipidemia) 05/04/2021   Elevated troponin 05/04/2021   Chronic diastolic CHF (congestive heart failure) (HCC) 05/04/2021   Recurrent major depressive disorder, in partial remission (HCC) 03/24/2021   Gout 03/24/2021   PAD (peripheral artery disease) (HCC) 06/24/2020   Iron  deficiency 10/07/2018   Primary osteoarthritis of left knee 09/17/2018   Emphysema lung (HCC) 03/14/2018   Diabetic peripheral neuropathy associated with type 2 diabetes  mellitus (HCC) 03/14/2018   Metatarsalgia of both feet 03/14/2018   Abnormality of lung 03/14/2018   Acquired absence of right breast 06/11/2017   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   Dysphagia 03/11/2017   Obesity 03/11/2017   Subclinical hypothyroidism 06/08/2015   Osteopenia 06/08/2015   Billowing mitral valve 06/08/2015   Melanoma in situ (HCC) 06/08/2015   Cramps of lower extremity 06/08/2015   Type 2 diabetes mellitus (HCC) 05/18/2015   Chronic diarrhea 05/18/2015   Anemia 05/18/2015   Clinical depression 01/26/2015   Hyperlipidemia associated with type 2 diabetes mellitus (HCC) 10/28/2014   Hypertension associated with diabetes (HCC) 09/27/2014   GERD (gastroesophageal reflux disease) 08/06/2014   Overactive bladder 07/16/2014    Social History   Tobacco Use   Smoking status: Former    Current packs/day: 0.00    Types: Cigarettes    Start date: 1963    Quit date: 1965    Years since quitting: 60.0   Smokeless tobacco: Never   Tobacco comments:    smoked in college--1 pack would last one week.  Substance Use Topics   Alcohol use: Yes    Alcohol/week: 7.0 - 14.0 standard drinks of alcohol    Types: 7 - 14 Glasses of wine per week    Comment: 1-2 glasses of wine per night    Allergies  Allergen Reactions   Codeine Other (See Comments)    Chest pain   Lorazepam Other (See  Comments)    Feels loopy   Oxycodone  Other (See Comments)    MENTAL CHANGES   Paregoric     Chest pain---tolerated MORPHINE    Penicillins Hives    Has patient had a PCN reaction causing immediate rash, facial/tongue/throat swelling, SOB or lightheadedness with hypotension: Yes Has patient had a PCN reaction causing severe rash involving mucus membranes or skin necrosis: No Has patient had a PCN reaction that required hospitalization: NO Has patient had a PCN reaction occurring within the  last 10 years: No If all of the above answers are NO, then may proceed with Cephalosporin use.  Can take Augmentin   Quinolones     PATIENT UNAWARE OF allergy to this class of antibiotics, only aware of allergy to [facility] Patient was warned about not using Cipro  and similar antibiotics. Recent studies have raised concern that fluoroquinolone antibiotics could be associated with an increased risk of aortic aneurysm Fluoroquinolones have non-antimicrobial properties that might jeopardise the integrity of the extracellular matrix of the vascular wall In a  propensity score matched cohort study in [place], there was a 66% increased rate   Aleve [Naproxen Sodium] Hives, Rash and Other (See Comments)    headaches    Current Meds  Medication Sig   acetaminophen  (TYLENOL ) 500 MG tablet Take 500 mg by mouth every 6 (six) hours as needed.   albuterol  (VENTOLIN  HFA) 108 (90 Base) MCG/ACT inhaler TAKE 2 PUFFS BY MOUTH EVERY 6 HOURS AS NEEDED FOR WHEEZE OR SHORTNESS OF BREATH   allopurinol  (ZYLOPRIM ) 100 MG tablet TAKE 2 TABLETS BY MOUTH EVERY DAY   ARIPiprazole  (ABILIFY ) 2 MG tablet TAKE 1 TABLET BY MOUTH EVERY DAY   aspirin  81 MG tablet Take 81 mg by mouth daily.   bismuth  subsalicylate (PEPTO BISMOL) 262 MG chewable tablet Chew 262 mg by mouth as needed. **capsule**   calcium  carbonate (TUMS - DOSED IN MG ELEMENTAL CALCIUM ) 500 MG chewable tablet Chew 1 tablet by mouth as needed for indigestion or heartburn.   carboxymethylcellulose (REFRESH PLUS) 0.5 % SOLN Place 1 drop into both eyes 3 (three) times daily as needed (Dry Eyes).   Cholecalciferol (VITAMIN D -3 PO) Take 1 tablet by mouth daily.   colestipol  (COLESTID ) 1 g tablet TAKE 1 TABLET BY MOUTH 2 TIMES DAILY.   famotidine  (PEPCID ) 20 MG tablet TAKE 1 TABLET BY MOUTH TWICE A DAY   ferrous sulfate  324 MG TBEC Take 324 mg by mouth daily with breakfast.   fluocinonide (LIDEX) 0.05 % external solution APPLY TO AFFECTED AREAS OF SCALP DAILY UNTIL  CLEAR, THEN FOR FLARES   Fluticasone -Umeclidin-Vilant (TRELEGY ELLIPTA ) 100-62.5-25 MCG/ACT AEPB Inhale 1 puff into the lungs daily.   furosemide  (LASIX ) 20 MG tablet TAKE 1 TABLET BY MOUTH EVERY DAY   lisinopril  (ZESTRIL ) 10 MG tablet Take 1 tablet (10 mg total) by mouth daily.   metoprolol  succinate (TOPROL  XL) 50 MG 24 hr tablet Take 0.5 tablets (25 mg total) by mouth daily.   Multiple Vitamins-Minerals (CENTRUM SILVER PO) Take 1 tablet by mouth daily.   omeprazole  (PRILOSEC) 40 MG capsule TAKE 1 CAPSULE BY MOUTH TWICE A DAY   oxybutynin  (DITROPAN -XL) 10 MG 24 hr tablet TAKE 1 TABLET BY MOUTH EVERY DAY   potassium chloride  (KLOR-CON ) 10 MEQ tablet TAKE 1 TABLET BY MOUTH EVERY DAY   rosuvastatin  (CRESTOR ) 10 MG tablet TAKE 1 TABLET BY MOUTH EVERY DAY   Venlafaxine  HCl 225 MG TB24 TAKE 1 TABLET (225 MG TOTAL) BY MOUTH DAILY. PLEASE SCHEDULE OFFICE  VISIT BEFORE ANY FUTURE REFILLS    Immunization History  Administered Date(s) Administered   Fluad Quad(high Dose 65+) 09/09/2018, 10/12/2019, 10/14/2020, 09/25/2021   Influenza Split 10/24/2011   Influenza, High Dose Seasonal PF 11/18/2013, 01/24/2016, 10/08/2016, 09/12/2017, 10/12/2022   Influenza-Unspecified 10/24/2011, 10/14/2012, 11/18/2013   Moderna Sars-Covid-2 Vaccination 01/30/2019, 03/02/2019   Pfizer(Comirnaty)Fall Seasonal Vaccine 12 years and older 10/12/2022   Pneumococcal Conjugate-13 08/21/2013   Pneumococcal Polysaccharide-23 01/24/2016   Respiratory Syncytial Virus Vaccine,Recomb Aduvanted(Arexvy) 11/11/2021   Tdap 10/24/2011   Zoster Recombinant(Shingrix) 04/01/2016, 08/14/2016        Objective:   BP (!) 156/100 (BP Location: Left Arm, Patient Position: Sitting, Cuff Size: Normal)   Pulse 71   Temp 97.9 F (36.6 C) (Temporal)   Ht 5' 5 (1.651 m)   Wt 199 lb (90.3 kg)   SpO2 96%   BMI 33.12 kg/m   SpO2: 96 %  GENERAL: Overweight woman, spry, age-appropriate, fully ambulatory, mild conversational  dyspnea. HEAD: Normocephalic, atraumatic.  EYES: Pupils equal, round, reactive to light.  No scleral icterus.  MOUTH: Oral mucosa moist, no thrush. NECK: Supple. No thyromegaly. Trachea midline. No JVD.  No adenopathy.  Faint bilateral carotid bruits. PULMONARY: Good air entry bilaterally.  Faint end expiratory wheezes noted.SABRA CARDIOVASCULAR: S1 and S2. Regular rate and rhythm.  Has a grade 1/6 systolic ejection murmur in the mitral position. ABDOMEN: Protuberant, otherwise benign MUSCULOSKELETAL: No joint deformity, no clubbing, no edema.  NEUROLOGIC: No overt focal deficit, no gait disturbance, speech is fluent. SKIN: Intact,warm,dry. PSYCH: Mood and behavior normal.      Ambulatory oxymetry was performed today:  At rest on room air oxygen  saturation was 94%, the patient ambulated at a moderate pace, completed the laps, O2 nadir 93%, moderate shortness of breath.  Resting heart rate was 70 bpm at maximum for this exercise 124 bpm.   Assessment & Plan:     ICD-10-CM   1. Mild intermittent asthmatic bronchitis without complication  J45.20     2. Dyspnea on exertion  R06.09 AMB referral to pulmonary rehabilitation    6 minute walk    Ambulatory referral to Cardiology    3. Aneurysm of ascending aorta without rupture (HCC)  I71.21     4. OSA (obstructive sleep apnea) - COMPLEX  G47.33 Pulse oximetry, overnight   Obstructive plus central, severe On auto CPAP    5. Hiatal hernia with GERD  K44.9 Ambulatory referral to Gastroenterology   K21.9       Orders Placed This Encounter  Procedures   AMB referral to pulmonary rehabilitation    Referral Priority:   Routine    Referral Type:   Consultation    Number of Visits Requested:   1   Ambulatory referral to Gastroenterology    Referral Priority:   Routine    Referral Type:   Consultation    Referral Reason:   Specialty Services Required    Number of Visits Requested:   1   Ambulatory referral to Cardiology    Referral  Priority:   Routine    Referral Type:   Consultation    Referral Reason:   Specialty Services Required    Number of Visits Requested:   1   Pulse oximetry, overnight    [facility] is DME. Do oximetry on current AVAPS equipment.    Standing Status:   Future    Expiration Date:   01/25/2024   6 minute walk    6 minute walk at [facility]  [place]    Standing Status:   Future    Expected Date:   02/08/2023    Expiration Date:   01/25/2024   Discussion:    Dyspnea on exertion Persistent dyspnea on exertion, including fatigue, shortness of breath, lightheadedness, and bendopnea after minimal exertion. Likely multifactorial, including deconditioning, weight, and possible cardiovascular issues. Patient reports needing to rest after walking a sixth of a mile and experiencing panic attacks related to breathing difficulties. Discussed weight loss, medication assistance, pulmonary rehabilitation, and potential cardiac catheterization (right heart cath). - Recommend weight loss, discuss with primary care physician for potential medication assistance - Refer to pulmonary rehabilitation for breathing exercises and conditioning - Use rescue inhaler 20 minutes before walks - Evaluate heart rate response during exertion, consider referral to cardiologist - Consider cardiac catheterization to check heart pressures  Asthma Asthma previously managed with Trelegy, currently not on it and reports no significant benefit. Uses rescue inhaler as needed. Discussed potential benefit of using inhaler before walks and considering restarting Trelegy if beneficial. - Use rescue inhaler before walks - Consider restarting Trelegy if inhaler use before walks is beneficial  Complex sleep apnea Complex sleep apnea with both obstructive and central components. Improved daytime alertness with AVAPs but experiences panic and dyspnea if the device is removed early. Reinforced the importance of continuous use during sleep. - Continue  auto CPAP use - Reinforce importance of continuous use during sleep  Gastroesophageal reflux disease (GERD) Chronic GERD not well controlled by omeprazole  and Pepcid , with breakthrough symptoms and regurgitation. Discussed potential for chronic aspiration and need for further evaluation by a gastroenterologist. - Refer to gastroenterologist for further evaluation and management - Evaluate for potential chronic aspiration due to GERD  Hypertension Elevated diastolic blood pressure, contributing to increased cardiac workload. Managed by cardiologist with recent medication adjustment. Discussed importance of blood pressure control to reduce cardiac stress and need for follow-up with cardiologist. - Follow up with cardiologist for re-evaluation of blood pressure management  General Health Maintenance Discussion of weight loss and physical conditioning. Patient has access to a systems analyst and has purchased training sessions. Discussed importance of strengthening core, improving stamina, and dietary interventions as part of pulmonary rehabilitation.  Discussed pulmonary rehabilitation to best determine the size needs. - Referral to pulmonary rehab - Consider dietary interventions as part of pulmonary rehabilitation program  Follow-up - Schedule follow-up visit in 6-8 weeks - Monitor oxygen  levels during walking test today no evidence of significant desaturation.      Advised if symptoms do not improve or worsen, to please contact office for sooner follow up or seek emergency care.    I spent 50 minutes of dedicated to the care of this patient on the date of this encounter to include pre-visit review of records, face-to-face time with the patient discussing conditions above, post visit ordering of testing, clinical documentation with the electronic health record, making appropriate referrals as documented, and communicating necessary findings to members of the patients care team.     C.  Leita Sanders, MD Advanced Bronchoscopy PCCM [facility]    *This note was generated using voice recognition software/Dragon and/or AI transcription program.  Despite best efforts to proofread, errors can occur which can change the meaning. Any transcriptional errors that result from this process are unintentional and may not be fully corrected at the time of dictation.

## 2023-01-27 NOTE — Progress Notes (Signed)
 Cardiology Clinic Note   Date: 01/29/2023 ID: Keah Lamba, DOB 27-Jan-1940, MRN [id number]  Primary Cardiologist:  Evalene Lunger, MD  Patient Profile    Natasha Vasquez is a 83 y.o. female who presents to the clinic today for routine follow up.     Past medical history significant for: Dyspnea. Coronary CTA 06/01/2021: Calcium  score of 0, no evidence of CAD. Echo 09/05/2022: EF 55 to 60%.  No RWMA.  Mild LVH.  Grade I DD.  Normal RV size/function.  No significant valvular abnormalities.  Moderate dilatation of ascending aorta 45 mm. PAD. Aneurysm of ascending thoracic aorta. CTA chest aorta 08/13/2022: Relatively unchanged size and configuration of the ascending aorta 4.7 cm. Hypertension. Hyperlipidemia. Lipid panel 05/04/2021: LDL 104, HDL 40, TG 224, total 189. Emphysema. OSA. CPAP 100% adherence.  GERD. T2DM. Subclinical hypothyroidism. Right breast cancer. S/p mastectomy.  In summary, patient was first evaluated by Dr. Gollan on 05/29/2021 for shortness of breath at the request of Dr. Tamea.  Echo in March 2023 showed EF 60 to 65%, moderate LVH, Grade I DD, normal RV size/function, no significant valvular abnormalities, mild dilatation of aortic root 39 mm.  She underwent coronary CTA in May 2023 to further evaluate shortness of breath and this showed a calcium  score of 0 with no evidence of CAD.     History of Present Illness    Natasha Vasquez is followed by Dr. Gollan for the above outlined history.  Patient was last seen in the office by Dr. Gollan on 08/01/2021 to follow-up after testing.  She continued to have shortness of breath.  It was recommended she continue close follow-up with pulmonary and restart walking program for conditioning.  Today, patient is accompanied by her husband. She continues to have shortness of breath. She has been evaluated by pulmonary and all of her testing has looked good. She is very frustrated, as she has not been able to find an  answer as to why she continues to have episodes of dyspnea. She describes these episodes as feeling like she cannot get in enough air. This typically occurs with simple exertion and more vigorous activity. She states it had gotten so bad that she had to rest between emptying the top and bottom of the dishwasher. She has to take rest breaks walking short distances. However, she has seen some improvement since starting CPAP. She can now empty the whole dishwasher without stopping. She stays active at her living facility's fitness center. She walks laps on the inside track. She can walk 7 laps with rest breaks after every 2 laps. She reports pulmonary feels reflux could be playing a part in her symptoms. She is going to see GI soon. She also expresses concern about her BP. BP today 118/90 but this is highly unusual for her, as it is typically in the 150s/90 or higher. Her PCP increased Toprol  but she does not feel it has helped. This concerns her, as she has an aortic aneurysm and her PCP has warned her of the dangers of high blood pressure.      ROS: All other systems reviewed and are otherwise negative except as noted in History of Present Illness.  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 with QRS widening and repolarization abnormality ( R in aVL , Cornell product , Romhilt-Estes ) When compared with ECG of 05/29/2021 (not in Muse) no significant changes Confirmed by Loistine Sober [phone]) on 01/29/2023 10:46:34 AM   05/18/2022: ALT 21; AST 21; BUN 16; Creatinine, Ser 0.91; Potassium 4.0; Sodium 142   09/07/2022: Hemoglobin 12.9; WBC 10.4   05/18/2022: TSH 3.210        Physical Exam    VS:  BP (!) 118/90 (BP Location: Left Arm,  Patient Position: Sitting, Cuff Size: Normal)   Pulse 75   Ht 5' 6 (1.676 m)   Wt 196 lb 6 oz (89.1 kg)   SpO2 97%   BMI 31.70 kg/m  , BMI Body mass index is 31.7 kg/m.  GEN: Well nourished, well developed, in no acute distress. Neck: No JVD or carotid bruits. Cardiac:  RRR. No murmurs. No rubs or gallops.   Respiratory:  Respirations regular and unlabored. Clear to auscultation without rales, wheezing or rhonchi. GI: Soft, nontender, nondistended. Extremities: Radials/DP/PT 2+ and equal bilaterally. No clubbing or cyanosis. No edema.  Skin: Warm and dry, no rash. Neuro: Strength intact.  Assessment & Plan   Dyspnea Echo August 2024 showed normal LV/RV function, mild LVH, Grade I DD, no significant valvular abnormalities.  Coronary CTA May 2023 showed calcium  score of 0 with no CAD. Patient continues to have episodes of dyspnea. She is now using a CPAP and feels it has improved slightly. Pulmonology feels there could be a component of reflux causing her symptoms and she is planning to see GI. She denies lower extremity edema,  Euvolemic and well compensated on exam.  -Continue lisinopril , Toprol , Lasix .  Aneurysm of ascending thoracic aorta CTA chest aorta July 2024 showed relatively unchanged size and configuration of the ascending aorta 4.7 cm.  Patient denies chest pain, abdominal pain, or back pain.  -Continue yearly surveillance.  Hypertension BP today 118/90. This is highly unusual for her as her BP is normally in the 150s/90 or higher. She is very concerned about this secondary to her aortic aneurysm. PCP increased Toprol  but it has not helped. Will have her stop Toprol  and start carvedilol  6.25 mg twice a day.  -Continue lisinopril .  Disposition: Stop Toprol . Start carvedilol  6.25 mg bid. Return in 3 months or sooner as needed.    Signed, Sober HERO. Kareema Keitt, DNP, NP-C

## 2023-01-28 NOTE — Telephone Encounter (Signed)
 Tonya with [facility] has confirmed she has the ONO order pulled to process

## 2023-01-29 ENCOUNTER — Ambulatory Visit: Payer: Medicare PPO | Attending: Student | Admitting: Student

## 2023-01-29 ENCOUNTER — Encounter: Payer: Self-pay | Admitting: Student

## 2023-01-29 VITALS — BP 118/90 | HR 75 | Ht 66.0 in | Wt 196.4 lb

## 2023-01-29 DIAGNOSIS — E1159 Type 2 diabetes mellitus with other circulatory complications: Secondary | ICD-10-CM | POA: Diagnosis not present

## 2023-01-29 DIAGNOSIS — I152 Hypertension secondary to endocrine disorders: Secondary | ICD-10-CM | POA: Diagnosis not present

## 2023-01-29 DIAGNOSIS — I7121 Aneurysm of the ascending aorta, without rupture: Secondary | ICD-10-CM

## 2023-01-29 DIAGNOSIS — R0609 Other forms of dyspnea: Secondary | ICD-10-CM

## 2023-01-29 MED ORDER — CARVEDILOL 6.25 MG PO TABS: 6.2500 mg | ORAL_TABLET | Freq: Two times a day (BID) | ORAL | 3 refills | Status: DC

## 2023-01-29 NOTE — Patient Instructions (Signed)
 Medication Instructions:   STOP Metoprolol  Succinate  START Carvedilol  - Take one tablet ( 6.25mg ) by mouth twice a day.  *If you need a refill on your cardiac medications before your next appointment, please call your pharmacy*   Lab Work:  None Ordered  If you have labs (blood work) drawn today and your tests are completely normal, you will receive your results only by: MyChart Message (if you have MyChart) OR A paper copy in the mail If you have any lab test that is abnormal or we need to change your treatment, we will call you to review the results.   Testing/Procedures:  None Ordered   Follow-Up: At [facility], you and your health needs are our priority.  As part of our continuing mission to provide you with exceptional heart care, we have created designated Provider Care Teams.  These Care Teams include your primary Cardiologist (physician) and Advanced Practice Providers (APPs -  Physician Assistants and Nurse Practitioners) who all work together to provide you with the care you need, when you need it.  We recommend signing up for the patient portal called MyChart.  Sign up information is provided on this After Visit Summary.  MyChart is used to connect with patients for Virtual Visits (Telemedicine).  Patients are able to view lab/test results, encounter notes, upcoming appointments, etc.  Non-urgent messages can be sent to your provider as well.   To learn more about what you can do with MyChart, go to [email or web address].    Your next appointment:   3 month(s)  Provider:   Barnie Hila, NP ONLY

## 2023-01-31 ENCOUNTER — Ambulatory Visit: Payer: Medicare PPO

## 2023-01-31 ENCOUNTER — Telehealth: Payer: Self-pay

## 2023-01-31 DIAGNOSIS — R0600 Dyspnea, unspecified: Secondary | ICD-10-CM | POA: Diagnosis not present

## 2023-01-31 DIAGNOSIS — R0609 Other forms of dyspnea: Secondary | ICD-10-CM

## 2023-01-31 NOTE — Telephone Encounter (Signed)
Her oxygen level actually INCREASED during exercise which means that her lungs are doing what they need to do.  Her heart rate goes very high very quickly.  This indicates that she has an issue with either cardiac/deconditioning.  She may benefit from cardiopulmonary rehab and reevaluation by cardiology.

## 2023-01-31 NOTE — Telephone Encounter (Signed)
Patient was in the office and completed the 6 min walk test. She would like to know your thoughts on the results and why she is having the SOB?

## 2023-01-31 NOTE — Progress Notes (Signed)
Patient was in the office to for a walk test. The patient did have to stop 2 times for shortness of breath. The first time was with and 40 secs left. The patient's O2 was 97% and heart rate was 128. Once she rested her heart rate did come down and she continued with the test. The second time was when she had and 30secs left, due to shortness of breath. Her O2 was at 95% and her heart rate was 112. Once she rested her heart rate came down and she was about to finish the test.

## 2023-02-01 DIAGNOSIS — G4733 Obstructive sleep apnea (adult) (pediatric): Secondary | ICD-10-CM | POA: Diagnosis not present

## 2023-02-01 NOTE — Telephone Encounter (Signed)
I have notified the patient. She said she just saw her Cardiologist and they said her heart was fine. She would like a referral to Cardiopulmonary Rehab.  I have placed the referral.  Nothing further needed.

## 2023-02-11 ENCOUNTER — Ambulatory Visit: Payer: Medicare PPO | Admitting: Family Medicine

## 2023-02-11 ENCOUNTER — Encounter: Payer: Self-pay | Admitting: Family Medicine

## 2023-02-11 VITALS — BP 136/85 | HR 78 | Ht 64.0 in | Wt 196.0 lb

## 2023-02-11 DIAGNOSIS — E1169 Type 2 diabetes mellitus with other specified complication: Secondary | ICD-10-CM | POA: Diagnosis not present

## 2023-02-11 DIAGNOSIS — M2550 Pain in unspecified joint: Secondary | ICD-10-CM

## 2023-02-11 DIAGNOSIS — E785 Hyperlipidemia, unspecified: Secondary | ICD-10-CM

## 2023-02-11 DIAGNOSIS — I5032 Chronic diastolic (congestive) heart failure: Secondary | ICD-10-CM | POA: Diagnosis not present

## 2023-02-11 DIAGNOSIS — E559 Vitamin D deficiency, unspecified: Secondary | ICD-10-CM | POA: Diagnosis not present

## 2023-02-11 DIAGNOSIS — E1142 Type 2 diabetes mellitus with diabetic polyneuropathy: Secondary | ICD-10-CM

## 2023-02-11 DIAGNOSIS — E1159 Type 2 diabetes mellitus with other circulatory complications: Secondary | ICD-10-CM | POA: Diagnosis not present

## 2023-02-11 DIAGNOSIS — I152 Hypertension secondary to endocrine disorders: Secondary | ICD-10-CM

## 2023-02-11 DIAGNOSIS — F331 Major depressive disorder, recurrent, moderate: Secondary | ICD-10-CM | POA: Diagnosis not present

## 2023-02-11 DIAGNOSIS — M1A0710 Idiopathic chronic gout, right ankle and foot, without tophus (tophi): Secondary | ICD-10-CM

## 2023-02-11 DIAGNOSIS — E038 Other specified hypothyroidism: Secondary | ICD-10-CM | POA: Diagnosis not present

## 2023-02-11 DIAGNOSIS — J432 Centrilobular emphysema: Secondary | ICD-10-CM | POA: Diagnosis not present

## 2023-02-11 DIAGNOSIS — R768 Other specified abnormal immunological findings in serum: Secondary | ICD-10-CM

## 2023-02-11 DIAGNOSIS — C50411 Malignant neoplasm of upper-outer quadrant of right female breast: Secondary | ICD-10-CM | POA: Diagnosis not present

## 2023-02-11 MED ORDER — TIRZEPATIDE 2.5 MG/0.5ML ~~LOC~~ SOAJ: 2.5000 mg | SUBCUTANEOUS | 0 refills | Status: DC

## 2023-02-11 MED ORDER — TIRZEPATIDE 5 MG/0.5ML ~~LOC~~ SOAJ: 5.0000 mg | SUBCUTANEOUS | 1 refills | Status: DC

## 2023-02-11 NOTE — Assessment & Plan Note (Signed)
On Crestor 10 mg daily. Due for cholesterol check. - Order lipid panel

## 2023-02-11 NOTE — Assessment & Plan Note (Signed)
Well-managed on current therapy. Due for thyroid function test. - Order thyroid function test

## 2023-02-11 NOTE — Assessment & Plan Note (Signed)
Reports persistent fatigue and dyspnea on exertion. Recent six-minute walk test showed increased oxygen levels during exercise but a rapid increase in heart rate, suggesting potential deconditioning or cardiac issues. Cardiopulmonary rehab recommended by pulmonology. Discussed the intensity and potential benefits of cardiopulmonary rehab, including improved stamina and strength. Patient expressed willingness to try rehab despite concerns about underlying issues. - proceed with cardiopulmonary rehab - Reevaluate by cardiology

## 2023-02-11 NOTE — Assessment & Plan Note (Signed)
On allopurinol 200 mg daily. No acute flares reported. - Continue current medication

## 2023-02-11 NOTE — Progress Notes (Signed)
Established patient visit   Patient: Natasha Vasquez   DOB: [birth year]   83 y.o. Female  MRN: [id number] Visit Date: 02/11/2023  Today's healthcare provider: Shirlee Latch, MD   No chief complaint on file.  Subjective    HPI HPI   Shortness of breath after minimal exercise and general fatigue. 1-2 year Last edited by Shelly Bombard, CMA on 02/11/2023  9:05 AM.       Discussed the use of AI scribe software for clinical note transcription with the patient, who gave verbal consent to proceed.  History of Present Illness   An 83 year old patient with a history of obesity, hyperlipidemia, type two diabetes, hypertension, chronic diastolic CHF, emphysema, subclinical hypothyroidism, history of breast cancer, and major depressive disorder presents for a chronic disease follow-up. The patient reports a persistent feeling of breathlessness and fatigue, which has been affecting her daily activities and quality of life. She describes difficulty walking long distances and needing to rest frequently. The patient also reports joint pain and stiffness, particularly in the knees and hands, which is most severe in the morning. She also mentions experiencing double vision in one eye and a sensation of tingling in the fingers. The patient expresses concern about these symptoms and is open to further investigation and treatment options.         Medications: Outpatient Medications Prior to Visit  Medication Sig   acetaminophen (TYLENOL) 500 MG tablet Take 500 mg by mouth every 6 (six) hours as needed.   albuterol (VENTOLIN HFA) 108 (90 Base) MCG/ACT inhaler TAKE 2 PUFFS BY MOUTH EVERY 6 HOURS AS NEEDED FOR WHEEZE OR SHORTNESS OF BREATH   allopurinol (ZYLOPRIM) 100 MG tablet TAKE 2 TABLETS BY MOUTH EVERY DAY   ARIPiprazole (ABILIFY) 2 MG tablet TAKE 1 TABLET BY MOUTH EVERY DAY   aspirin 81 MG tablet Take 81 mg by mouth daily.   bismuth subsalicylate (PEPTO BISMOL) 262 MG chewable tablet Chew  262 mg by mouth as needed. **capsule**   calcium carbonate (TUMS - DOSED IN MG ELEMENTAL CALCIUM) 500 MG chewable tablet Chew 1 tablet by mouth as needed for indigestion or heartburn.   carboxymethylcellulose (REFRESH PLUS) 0.5 % SOLN Place 1 drop into both eyes 3 (three) times daily as needed (Dry Eyes).   carvedilol (COREG) 6.25 MG tablet Take 1 tablet (6.25 mg total) by mouth 2 (two) times daily.   Cholecalciferol (VITAMIN D-3 PO) Take 1 tablet by mouth daily.   colestipol (COLESTID) 1 g tablet TAKE 1 TABLET BY MOUTH 2 TIMES DAILY.   famotidine (PEPCID) 20 MG tablet TAKE 1 TABLET BY MOUTH TWICE A DAY   ferrous sulfate 324 MG TBEC Take 324 mg by mouth daily with breakfast.   fluocinonide (LIDEX) 0.05 % external solution APPLY TO AFFECTED AREAS OF SCALP DAILY UNTIL CLEAR, THEN FOR FLARES   Fluticasone-Umeclidin-Vilant (TRELEGY ELLIPTA) 100-62.5-25 MCG/ACT AEPB Inhale 1 puff into the lungs daily.   furosemide (LASIX) 20 MG tablet TAKE 1 TABLET BY MOUTH EVERY DAY   lisinopril (ZESTRIL) 10 MG tablet Take 1 tablet (10 mg total) by mouth daily.   Multiple Vitamins-Minerals (CENTRUM SILVER PO) Take 1 tablet by mouth daily.   omeprazole (PRILOSEC) 40 MG capsule TAKE 1 CAPSULE BY MOUTH TWICE A DAY   oxybutynin (DITROPAN-XL) 10 MG 24 hr tablet TAKE 1 TABLET BY MOUTH EVERY DAY   potassium chloride (KLOR-CON) 10 MEQ tablet TAKE 1 TABLET BY MOUTH EVERY DAY   rosuvastatin (CRESTOR) 10 MG  tablet TAKE 1 TABLET BY MOUTH EVERY DAY   Venlafaxine HCl 225 MG TB24 TAKE 1 TABLET (225 MG TOTAL) BY MOUTH DAILY. PLEASE SCHEDULE OFFICE VISIT BEFORE ANY FUTURE REFILLS   No facility-administered medications prior to visit.    Review of Systems     Objective    BP 136/85 (BP Location: Left Arm, Patient Position: Sitting, Cuff Size: Large)   Pulse 78   Ht 5\' 4"  (1.626 m)   Wt 196 lb (88.9 kg)   SpO2 94%   BMI 33.64 kg/m    Physical Exam Vitals reviewed.  Constitutional:      General: She is not in acute  distress.    Appearance: Normal appearance. She is well-developed. She is not diaphoretic.  HENT:     Head: Normocephalic and atraumatic.  Eyes:     General: No scleral icterus.    Conjunctiva/sclera: Conjunctivae normal.  Neck:     Thyroid: No thyromegaly.  Cardiovascular:     Rate and Rhythm: Normal rate and regular rhythm.     Heart sounds: Normal heart sounds. No murmur heard. Pulmonary:     Effort: Pulmonary effort is normal. No respiratory distress.     Breath sounds: Normal breath sounds. No wheezing, rhonchi or rales.  Musculoskeletal:     Cervical back: Neck supple.     Right lower leg: No edema.     Left lower leg: No edema.  Lymphadenopathy:     Cervical: No cervical adenopathy.  Skin:    General: Skin is warm and dry.     Findings: No rash.  Neurological:     Mental Status: She is alert and oriented to person, place, and time. Mental status is at baseline.  Psychiatric:        Mood and Affect: Mood normal.        Behavior: Behavior normal.      No results found for any visits on 02/11/23.  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   On carvedilol 6.25 mg BID and lisinopril 10 mg daily. Blood pressure management appears stable. - Continue current medications      Relevant Medications   tirzepatide (MOUNJARO) 2.5 MG/0.5ML Pen   tirzepatide (MOUNJARO) 5 MG/0.5ML Pen   Other Relevant Orders   Comprehensive metabolic panel with eGFR (no eGFR if sent to [facility])   Chronic diastolic CHF (congestive heart failure) (HCC)   Reports persistent fatigue and dyspnea on exertion. Recent six-minute walk test showed increased oxygen levels during exercise but a rapid increase in heart rate, suggesting potential deconditioning or cardiac issues. Cardiopulmonary rehab recommended by pulmonology. Discussed the intensity and potential benefits of cardiopulmonary  rehab, including improved stamina and strength. Patient expressed willingness to try rehab despite concerns about underlying issues. - proceed with cardiopulmonary rehab - Reevaluate by cardiology        Respiratory   Emphysema lung (HCC)   Experiences shortness of breath and fatigue. Pulmonology evaluation indicated adequate lung function during exercise. - Continue current management        Endocrine   Hyperlipidemia associated with type 2 diabetes mellitus (HCC)   On Crestor 10 mg daily. Due for cholesterol check. - Order lipid panel      Relevant Medications   tirzepatide (MOUNJARO) 2.5 MG/0.5ML Pen   tirzepatide (MOUNJARO) 5 MG/0.5ML Pen   Other Relevant Orders   Comprehensive  metabolic panel with eGFR (no eGFR if sent to [facility])   Lipid panel   Subclinical hypothyroidism   Well-managed on current therapy. Due for thyroid function test. - Order thyroid function test      Relevant Orders   TSH + free T4   Type 2 diabetes mellitus (HCC)   Has gained significant weight over the past few years, contributing to fatigue and decreased exercise tolerance. Discussed potential benefits of weight loss medications, specifically Mounjaro, which can aid in weight loss and improve diabetes management. Explained that Mounjaro works by decreasing appetite and improving gut hormone function.  Diabetes has been well controlled. Due for A1c check. - Order A1c test - Prescribe Mounjaro 2.5 mg weekly for one month, then increase to 5 mg weekly - Monitor for side effects such as nausea and constipation - Encourage dietary fiber intake to prevent constipation      Relevant Medications   tirzepatide (MOUNJARO) 2.5 MG/0.5ML Pen   tirzepatide (MOUNJARO) 5 MG/0.5ML Pen   Other Relevant Orders   Urine microalbumin-creatinine with uACR   Hemoglobin A1c   Diabetic peripheral neuropathy associated with type 2 diabetes mellitus (HCC)   Chronic and stable Consider gabapentin in the future        Relevant Medications   tirzepatide (MOUNJARO) 2.5 MG/0.5ML Pen   tirzepatide (MOUNJARO) 5 MG/0.5ML Pen     Other   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 No acute flares reported. - Continue current medication      Moderate episode of recurrent major depressive disorder (HCC)   On Effexor 225 mg daily and Abilify 2 mg daily. Reports anxiety related to physical health issues and husband's health issues. - Continue current medications - Monitor mental health status      Other Visit Diagnoses       Avitaminosis D       Relevant Orders   VITAMIN D 25 Hydroxy (Vit-D Deficiency, Fractures)     Polyarthralgia       Relevant Orders   Ambulatory referral to Rheumatology     Rheumatoid factor positive       Relevant Orders   Ambulatory referral to Rheumatology           Rheumatoid Factor positive with polyarthralgia Reports joint pain, particularly in the knees and hands, with morning stiffness and difficulty moving. Positive rheumatoid factor and inflammation labs. Symptoms suggestive of inflammatory arthritis. Discussed the potential impact of rheumatoid arthritis on lungs and overall health. Patient expressed willingness to see a rheumatologist for further evaluation. - Refer to rheumatology for further evaluation and management  General Health Maintenance Due for several routine health maintenance checks. - Order kidney and liver function tests - Order vitamin D test - Order annual urine test for protein - Perform foot exam - Remind to schedule yearly eye exam - Encourage second opinion for persistent double vision in right eye  Follow-up - Schedule follow-up appointment in three months - Coordinate with wellness nurse for annual wellness visit on April 14 th.          Return in about 3 months (around 05/12/2023) for CPE.      Total time spent on today's visit was greater than 45 minutes, including  both face-to-face time and nonface-to-face time personally spent on review of chart (labs and imaging), discussing labs and goals, discussing further work-up, treatment options, answering patient's questions, and coordinating care.   Shirlee Latch, MD  [facility] [phone] (phone) [phone] (fax)  [facility]

## 2023-02-11 NOTE — Assessment & Plan Note (Signed)
Chronic and stable ?Consider gabapentin in the future  ?

## 2023-02-11 NOTE — Assessment & Plan Note (Signed)
On carvedilol 6.25 mg BID and lisinopril 10 mg daily. Blood pressure management appears stable. - Continue current medications

## 2023-02-11 NOTE — Assessment & Plan Note (Signed)
Experiences shortness of breath and fatigue. Pulmonology evaluation indicated adequate lung function during exercise. - Continue current management

## 2023-02-11 NOTE — Assessment & Plan Note (Signed)
On Effexor 225 mg daily and Abilify 2 mg daily. Reports anxiety related to physical health issues and husband's health issues. - Continue current medications - Monitor mental health status

## 2023-02-11 NOTE — Assessment & Plan Note (Signed)
Has gained significant weight over the past few years, contributing to fatigue and decreased exercise tolerance. Discussed potential benefits of weight loss medications, specifically Mounjaro, which can aid in weight loss and improve diabetes management. Explained that Mounjaro works by decreasing appetite and improving gut hormone function.  Diabetes has been well controlled. Due for A1c check. - Order A1c test - Prescribe Mounjaro 2.5 mg weekly for one month, then increase to 5 mg weekly - Monitor for side effects such as nausea and constipation - Encourage dietary fiber intake to prevent constipation

## 2023-02-12 ENCOUNTER — Encounter: Payer: Self-pay | Admitting: Family Medicine

## 2023-02-12 ENCOUNTER — Other Ambulatory Visit: Payer: Self-pay | Admitting: *Deleted

## 2023-02-13 LAB — COMPREHENSIVE METABOLIC PANEL
ALT: 34 [IU]/L — ABNORMAL HIGH (ref 0–32)
AST: 30 [IU]/L (ref 0–40)
Albumin: 4.7 g/dL (ref 3.7–4.7)
Alkaline Phosphatase: 105 [IU]/L (ref 44–121)
BUN/Creatinine Ratio: 15 (ref 12–28)
BUN: 18 mg/dL (ref 8–27)
Bilirubin Total: 0.4 mg/dL (ref 0.0–1.2)
CO2: 22 mmol/L (ref 20–29)
Calcium: 10 mg/dL (ref 8.7–10.3)
Chloride: 99 mmol/L (ref 96–106)
Creatinine, Ser: 1.22 mg/dL — ABNORMAL HIGH (ref 0.57–1.00)
Globulin, Total: 2.3 g/dL (ref 1.5–4.5)
Glucose: 127 mg/dL — ABNORMAL HIGH (ref 70–99)
Potassium: 4.6 mmol/L (ref 3.5–5.2)
Sodium: 139 mmol/L (ref 134–144)
Total Protein: 7 g/dL (ref 6.0–8.5)
eGFR: 44 mL/min/{1.73_m2} — ABNORMAL LOW (ref >59)

## 2023-02-13 LAB — TSH+FREE T4
Free T4: 0.94 ng/dL (ref 0.82–1.77)
TSH: 3.75 u[IU]/mL (ref 0.450–4.500)

## 2023-02-13 LAB — VITAMIN D 25 HYDROXY (VIT D DEFICIENCY, FRACTURES): Vit D, 25-Hydroxy: 49.7 ng/mL (ref 30.0–100.0)

## 2023-02-13 LAB — LIPID PANEL
Chol/HDL Ratio: 5.1 {ratio} — ABNORMAL HIGH (ref 0.0–4.4)
Cholesterol, Total: 198 mg/dL (ref 100–199)
HDL: 39 mg/dL — ABNORMAL LOW (ref >39)
LDL Chol Calc (NIH): 98 mg/dL (ref 0–99)
Triglycerides: 365 mg/dL — ABNORMAL HIGH (ref 0–149)
VLDL Cholesterol Cal: 61 mg/dL — ABNORMAL HIGH (ref 5–40)

## 2023-02-13 LAB — HEMOGLOBIN A1C
Est. average glucose Bld gHb Est-mCnc: 143 mg/dL
Hgb A1c MFr Bld: 6.6 % — ABNORMAL HIGH (ref 4.8–5.6)

## 2023-02-13 LAB — MICROALBUMIN / CREATININE URINE RATIO: Creatinine, Urine: 40.8 mg/dL

## 2023-02-15 ENCOUNTER — Telehealth: Payer: Self-pay

## 2023-02-15 DIAGNOSIS — G4734 Idiopathic sleep related nonobstructive alveolar hypoventilation: Secondary | ICD-10-CM

## 2023-02-15 NOTE — Telephone Encounter (Signed)
ONO reviewed by Dr.Gonzalez- Low SpO2 83%. Need to add 1Lpm bled in through [facility].  I have notified the patient and place the order for the O2 to [facility].  Nothing further needed.

## 2023-02-19 ENCOUNTER — Other Ambulatory Visit: Payer: Self-pay

## 2023-02-19 DIAGNOSIS — G4733 Obstructive sleep apnea (adult) (pediatric): Secondary | ICD-10-CM | POA: Diagnosis not present

## 2023-02-19 DIAGNOSIS — N289 Disorder of kidney and ureter, unspecified: Secondary | ICD-10-CM

## 2023-02-22 ENCOUNTER — Other Ambulatory Visit: Payer: Self-pay | Admitting: Thoracic Surgery (Cardiothoracic Vascular Surgery)

## 2023-02-22 DIAGNOSIS — I7121 Aneurysm of the ascending aorta, without rupture: Secondary | ICD-10-CM

## 2023-02-25 ENCOUNTER — Inpatient Hospital Stay: Payer: Medicare PPO

## 2023-02-25 ENCOUNTER — Emergency Department
Admission: EM | Admit: 2023-02-25 | Discharge: 2023-02-25 | Disposition: A | Discharge status: Other Acute Inpatient Hospital | Payer: Medicare PPO | Attending: Emergency Medicine | Admitting: Emergency Medicine

## 2023-02-25 ENCOUNTER — Encounter (HOSPITAL_COMMUNITY): Payer: Self-pay

## 2023-02-25 ENCOUNTER — Other Ambulatory Visit: Payer: Self-pay

## 2023-02-25 ENCOUNTER — Emergency Department: Payer: Medicare PPO

## 2023-02-25 ENCOUNTER — Inpatient Hospital Stay (HOSPITAL_COMMUNITY): Payer: Medicare PPO

## 2023-02-25 ENCOUNTER — Inpatient Hospital Stay (HOSPITAL_COMMUNITY)
Admission: EM | Admit: 2023-02-25 | Discharge: 2023-03-01 | DRG: 065 | Disposition: A | Discharge status: Home Health | Payer: Medicare PPO | Source: Other Acute Inpatient Hospital | Attending: Internal Medicine | Admitting: Internal Medicine

## 2023-02-25 ENCOUNTER — Other Ambulatory Visit: Payer: Self-pay | Admitting: Family Medicine

## 2023-02-25 DIAGNOSIS — Z8249 Family history of ischemic heart disease and other diseases of the circulatory system: Secondary | ICD-10-CM

## 2023-02-25 DIAGNOSIS — Z96641 Presence of right artificial hip joint: Secondary | ICD-10-CM | POA: Diagnosis not present

## 2023-02-25 DIAGNOSIS — Z9049 Acquired absence of other specified parts of digestive tract: Secondary | ICD-10-CM

## 2023-02-25 DIAGNOSIS — I6522 Occlusion and stenosis of left carotid artery: Secondary | ICD-10-CM | POA: Insufficient documentation

## 2023-02-25 DIAGNOSIS — I6389 Other cerebral infarction: Secondary | ICD-10-CM

## 2023-02-25 DIAGNOSIS — G8191 Hemiplegia, unspecified affecting right dominant side: Secondary | ICD-10-CM | POA: Diagnosis present

## 2023-02-25 DIAGNOSIS — I10 Essential (primary) hypertension: Secondary | ICD-10-CM

## 2023-02-25 DIAGNOSIS — I6381 Other cerebral infarction due to occlusion or stenosis of small artery: Principal | ICD-10-CM | POA: Diagnosis present

## 2023-02-25 DIAGNOSIS — G464 Cerebellar stroke syndrome: Secondary | ICD-10-CM | POA: Insufficient documentation

## 2023-02-25 DIAGNOSIS — F32A Depression, unspecified: Secondary | ICD-10-CM | POA: Diagnosis present

## 2023-02-25 DIAGNOSIS — Z7951 Long term (current) use of inhaled steroids: Secondary | ICD-10-CM

## 2023-02-25 DIAGNOSIS — E1159 Type 2 diabetes mellitus with other circulatory complications: Secondary | ICD-10-CM | POA: Diagnosis not present

## 2023-02-25 DIAGNOSIS — Z471 Aftercare following joint replacement surgery: Secondary | ICD-10-CM | POA: Diagnosis not present

## 2023-02-25 DIAGNOSIS — Z9011 Acquired absence of right breast and nipple: Secondary | ICD-10-CM | POA: Diagnosis not present

## 2023-02-25 DIAGNOSIS — R0602 Shortness of breath: Secondary | ICD-10-CM | POA: Diagnosis present

## 2023-02-25 DIAGNOSIS — R531 Weakness: Secondary | ICD-10-CM | POA: Diagnosis not present

## 2023-02-25 DIAGNOSIS — E7800 Pure hypercholesterolemia, unspecified: Secondary | ICD-10-CM | POA: Diagnosis present

## 2023-02-25 DIAGNOSIS — E785 Hyperlipidemia, unspecified: Secondary | ICD-10-CM | POA: Insufficient documentation

## 2023-02-25 DIAGNOSIS — R1313 Dysphagia, pharyngeal phase: Secondary | ICD-10-CM | POA: Diagnosis not present

## 2023-02-25 DIAGNOSIS — Z85820 Personal history of malignant melanoma of skin: Secondary | ICD-10-CM | POA: Diagnosis not present

## 2023-02-25 DIAGNOSIS — I152 Hypertension secondary to endocrine disorders: Secondary | ICD-10-CM | POA: Diagnosis not present

## 2023-02-25 DIAGNOSIS — Z833 Family history of diabetes mellitus: Secondary | ICD-10-CM

## 2023-02-25 DIAGNOSIS — Z880 Allergy status to penicillin: Secondary | ICD-10-CM | POA: Diagnosis not present

## 2023-02-25 DIAGNOSIS — M25551 Pain in right hip: Secondary | ICD-10-CM | POA: Diagnosis present

## 2023-02-25 DIAGNOSIS — R29818 Other symptoms and signs involving the nervous system: Secondary | ICD-10-CM | POA: Diagnosis not present

## 2023-02-25 DIAGNOSIS — R1310 Dysphagia, unspecified: Secondary | ICD-10-CM | POA: Diagnosis not present

## 2023-02-25 DIAGNOSIS — E1142 Type 2 diabetes mellitus with diabetic polyneuropathy: Secondary | ICD-10-CM | POA: Diagnosis present

## 2023-02-25 DIAGNOSIS — Z1722 Progesterone receptor negative status: Secondary | ICD-10-CM

## 2023-02-25 DIAGNOSIS — Z800 Family history of malignant neoplasm of digestive organs: Secondary | ICD-10-CM | POA: Diagnosis not present

## 2023-02-25 DIAGNOSIS — Z886 Allergy status to analgesic agent status: Secondary | ICD-10-CM

## 2023-02-25 DIAGNOSIS — E1169 Type 2 diabetes mellitus with other specified complication: Secondary | ICD-10-CM

## 2023-02-25 DIAGNOSIS — K219 Gastro-esophageal reflux disease without esophagitis: Secondary | ICD-10-CM | POA: Diagnosis present

## 2023-02-25 DIAGNOSIS — Z9889 Other specified postprocedural states: Secondary | ICD-10-CM | POA: Insufficient documentation

## 2023-02-25 DIAGNOSIS — I700 Atherosclerosis of aorta: Secondary | ICD-10-CM | POA: Insufficient documentation

## 2023-02-25 DIAGNOSIS — E669 Obesity, unspecified: Secondary | ICD-10-CM | POA: Diagnosis present

## 2023-02-25 DIAGNOSIS — I6782 Cerebral ischemia: Secondary | ICD-10-CM | POA: Diagnosis not present

## 2023-02-25 DIAGNOSIS — Z885 Allergy status to narcotic agent status: Secondary | ICD-10-CM

## 2023-02-25 DIAGNOSIS — R29703 NIHSS score 3: Secondary | ICD-10-CM | POA: Diagnosis present

## 2023-02-25 DIAGNOSIS — E119 Type 2 diabetes mellitus without complications: Secondary | ICD-10-CM | POA: Insufficient documentation

## 2023-02-25 DIAGNOSIS — E782 Mixed hyperlipidemia: Secondary | ICD-10-CM | POA: Diagnosis not present

## 2023-02-25 DIAGNOSIS — Z87891 Personal history of nicotine dependence: Secondary | ICD-10-CM

## 2023-02-25 DIAGNOSIS — Z90710 Acquired absence of both cervix and uterus: Secondary | ICD-10-CM

## 2023-02-25 DIAGNOSIS — Z888 Allergy status to other drugs, medicaments and biological substances status: Secondary | ICD-10-CM

## 2023-02-25 DIAGNOSIS — I7121 Aneurysm of the ascending aorta, without rupture: Secondary | ICD-10-CM | POA: Diagnosis present

## 2023-02-25 DIAGNOSIS — Z8673 Personal history of transient ischemic attack (TIA), and cerebral infarction without residual deficits: Secondary | ICD-10-CM | POA: Diagnosis present

## 2023-02-25 DIAGNOSIS — J45909 Unspecified asthma, uncomplicated: Secondary | ICD-10-CM | POA: Diagnosis not present

## 2023-02-25 DIAGNOSIS — Z794 Long term (current) use of insulin: Secondary | ICD-10-CM | POA: Diagnosis not present

## 2023-02-25 DIAGNOSIS — I639 Cerebral infarction, unspecified: Principal | ICD-10-CM | POA: Diagnosis present

## 2023-02-25 DIAGNOSIS — Z79899 Other long term (current) drug therapy: Secondary | ICD-10-CM

## 2023-02-25 DIAGNOSIS — Z171 Estrogen receptor negative status [ER-]: Secondary | ICD-10-CM

## 2023-02-25 DIAGNOSIS — Z7982 Long term (current) use of aspirin: Secondary | ICD-10-CM

## 2023-02-25 DIAGNOSIS — Z7985 Long-term (current) use of injectable non-insulin antidiabetic drugs: Secondary | ICD-10-CM | POA: Diagnosis not present

## 2023-02-25 DIAGNOSIS — Z7902 Long term (current) use of antithrombotics/antiplatelets: Secondary | ICD-10-CM

## 2023-02-25 DIAGNOSIS — Z801 Family history of malignant neoplasm of trachea, bronchus and lung: Secondary | ICD-10-CM

## 2023-02-25 DIAGNOSIS — M109 Gout, unspecified: Secondary | ICD-10-CM | POA: Diagnosis present

## 2023-02-25 DIAGNOSIS — F4322 Adjustment disorder with anxiety: Secondary | ICD-10-CM

## 2023-02-25 DIAGNOSIS — I493 Ventricular premature depolarization: Secondary | ICD-10-CM | POA: Diagnosis present

## 2023-02-25 DIAGNOSIS — R4701 Aphasia: Secondary | ICD-10-CM | POA: Diagnosis present

## 2023-02-25 DIAGNOSIS — R29810 Facial weakness: Secondary | ICD-10-CM | POA: Diagnosis not present

## 2023-02-25 DIAGNOSIS — G4733 Obstructive sleep apnea (adult) (pediatric): Secondary | ICD-10-CM | POA: Diagnosis present

## 2023-02-25 LAB — URINALYSIS, W/ REFLEX TO CULTURE (INFECTION SUSPECTED)
Bacteria, UA: NONE SEEN
Bilirubin Urine: NEGATIVE
Glucose, UA: NEGATIVE mg/dL
Hgb urine dipstick: NEGATIVE
Ketones, ur: NEGATIVE mg/dL
Leukocytes,Ua: NEGATIVE
Nitrite: NEGATIVE
Protein, ur: NEGATIVE mg/dL
Specific Gravity, Urine: 1.015 (ref 1.005–1.030)
pH: 5 (ref 5.0–8.0)

## 2023-02-25 LAB — CBC WITH DIFFERENTIAL/PLATELET
Abs Immature Granulocytes: 0.05 10*3/uL (ref 0.00–0.07)
Basophils Absolute: 0.1 10*3/uL (ref 0.0–0.1)
Basophils Relative: 1 %
Eosinophils Absolute: 0.1 10*3/uL (ref 0.0–0.5)
Eosinophils Relative: 2 %
HCT: 38.4 % (ref 36.0–46.0)
Hemoglobin: 12.4 g/dL (ref 12.0–15.0)
Immature Granulocytes: 1 %
Lymphocytes Relative: 18 %
Lymphs Abs: 1.3 10*3/uL (ref 0.7–4.0)
MCH: 29.3 pg (ref 26.0–34.0)
MCHC: 32.3 g/dL (ref 30.0–36.0)
MCV: 90.8 fL (ref 80.0–100.0)
Monocytes Absolute: 0.5 10*3/uL (ref 0.1–1.0)
Monocytes Relative: 7 %
Neutro Abs: 5.1 10*3/uL (ref 1.7–7.7)
Neutrophils Relative %: 71 %
Platelets: 301 10*3/uL (ref 150–400)
RBC: 4.23 MIL/uL (ref 3.87–5.11)
RDW: 15.3 % (ref 11.5–15.5)
WBC: 7.1 10*3/uL (ref 4.0–10.5)
nRBC: 0 % (ref 0.0–0.2)

## 2023-02-25 LAB — COMPREHENSIVE METABOLIC PANEL
ALT: 40 U/L (ref 0–44)
AST: 34 U/L (ref 15–41)
Albumin: 4.5 g/dL (ref 3.5–5.0)
Alkaline Phosphatase: 71 U/L (ref 38–126)
Anion gap: 10 (ref 5–15)
BUN: 14 mg/dL (ref 8–23)
CO2: 25 mmol/L (ref 22–32)
Calcium: 9.3 mg/dL (ref 8.9–10.3)
Chloride: 105 mmol/L (ref 98–111)
Creatinine, Ser: 0.86 mg/dL (ref 0.44–1.00)
GFR, Estimated: >60 mL/min (ref >60)
Glucose, Bld: 135 mg/dL — ABNORMAL HIGH (ref 70–99)
Potassium: 3.8 mmol/L (ref 3.5–5.1)
Sodium: 140 mmol/L (ref 135–145)
Total Bilirubin: 0.6 mg/dL (ref 0.0–1.2)
Total Protein: 7.3 g/dL (ref 6.5–8.1)

## 2023-02-25 LAB — PROTIME-INR
INR: 1 (ref 0.8–1.2)
Prothrombin Time: 13.8 s (ref 11.4–15.2)

## 2023-02-25 LAB — TROPONIN I (HIGH SENSITIVITY): Troponin I (High Sensitivity): 7 ng/L (ref <18)

## 2023-02-25 LAB — APTT: aPTT: 28 s (ref 24–36)

## 2023-02-25 LAB — GLUCOSE, CAPILLARY: Glucose-Capillary: 103 mg/dL — ABNORMAL HIGH (ref 70–99)

## 2023-02-25 LAB — T4, FREE: Free T4: 0.63 ng/dL (ref 0.61–1.12)

## 2023-02-25 LAB — TSH: TSH: 3.325 u[IU]/mL (ref 0.350–4.500)

## 2023-02-25 LAB — MAGNESIUM: Magnesium: 2 mg/dL (ref 1.7–2.4)

## 2023-02-25 MED ORDER — ENOXAPARIN SODIUM 40 MG/0.4ML IJ SOSY
40.0000 mg | PREFILLED_SYRINGE | INTRAMUSCULAR | Status: DC
  Administered 2023-02-26 – 2023-03-01 (×4): 40 mg via SUBCUTANEOUS
  Filled 2023-02-25 (×4): qty 0.4

## 2023-02-25 MED ORDER — INSULIN ASPART 100 UNIT/ML IJ SOLN
0.0000 [IU] | Freq: Three times a day (TID) | INTRAMUSCULAR | Status: DC
  Administered 2023-02-27 – 2023-02-28 (×2): 3 [IU] via SUBCUTANEOUS

## 2023-02-25 MED ORDER — INSULIN ASPART 100 UNIT/ML IJ SOLN: 0.0000 [IU] | INTRAMUSCULAR | Status: DC

## 2023-02-25 MED ORDER — ASPIRIN 81 MG PO CHEW
324.0000 mg | CHEWABLE_TABLET | Freq: Once | ORAL | Status: DC
  Filled 2023-02-25: qty 4

## 2023-02-25 MED ORDER — IOHEXOL 350 MG/ML SOLN
60.0000 mL | Freq: Once | INTRAVENOUS | Status: AC | PRN
  Administered 2023-02-25: 60 mL via INTRAVENOUS

## 2023-02-25 MED ORDER — STROKE: EARLY STAGES OF RECOVERY BOOK
Freq: Once | Status: AC
  Filled 2023-02-25: qty 1

## 2023-02-25 MED ORDER — ASPIRIN 300 MG RE SUPP: 300.0000 mg | Freq: Once | RECTAL | Status: DC

## 2023-02-25 MED ORDER — ASPIRIN 300 MG RE SUPP
300.0000 mg | Freq: Once | RECTAL | Status: AC
  Administered 2023-02-25: 300 mg via RECTAL
  Filled 2023-02-25: qty 1

## 2023-02-25 MED ORDER — DIAZEPAM 5 MG/ML IJ SOLN
2.5000 mg | Freq: Once | INTRAMUSCULAR | Status: AC
  Administered 2023-02-25: 2.5 mg via INTRAVENOUS
  Filled 2023-02-25: qty 2

## 2023-02-25 MED ORDER — ACETAMINOPHEN 325 MG PO TABS
650.0000 mg | ORAL_TABLET | ORAL | Status: DC | PRN
  Administered 2023-02-27 (×2): 650 mg via ORAL
  Filled 2023-02-25 (×2): qty 2

## 2023-02-25 MED ORDER — IPRATROPIUM-ALBUTEROL 0.5-2.5 (3) MG/3ML IN SOLN: 3.0000 mL | Freq: Four times a day (QID) | RESPIRATORY_TRACT | Status: DC | PRN

## 2023-02-25 MED ORDER — ACETAMINOPHEN 650 MG RE SUPP: 650.0000 mg | RECTAL | Status: DC | PRN

## 2023-02-25 MED ORDER — ACETAMINOPHEN 160 MG/5ML PO SOLN: 650.0000 mg | ORAL | Status: DC | PRN

## 2023-02-25 MED ORDER — SENNOSIDES-DOCUSATE SODIUM 8.6-50 MG PO TABS: 1.0000 | ORAL_TABLET | Freq: Every evening | ORAL | Status: DC | PRN

## 2023-02-25 NOTE — ED Triage Notes (Signed)
 Pt states she feels like she had a stroke last night. She lives at [facility] and called their emergency staff this morning and they advised her to go to her doctor's appointment early. Pt reports R side weakness and pt feels off balance when walking. Pt says she woke up this morning at 330 and still felt normal. When she woke up at 0700, spouse reports her being "wobbly" on her feet. Pt endorses R hand tingling.

## 2023-02-25 NOTE — Plan of Care (Signed)
   Problem: Education: Goal: Knowledge of General Education information will improve Description: Including pain rating scale, medication(s)/side effects and non-pharmacologic comfort measures Outcome: Progressing   Problem: Coping: Goal: Level of anxiety will decrease Outcome: Progressing

## 2023-02-25 NOTE — ED Notes (Signed)
 I spoke with charge nurse Pattie Borders regarding stroke alert for patient. Due to patient's LKW and lack of LVO symptoms, I was instructed to do the head ct and bloodwork in triage, but the patient didn't qualify for code stroke alert.

## 2023-02-25 NOTE — Progress Notes (Signed)
 Refused CPAP

## 2023-02-25 NOTE — ED Notes (Signed)
CARELINK  CALLED  PER DR  Cherylann Banas  MD

## 2023-02-25 NOTE — ED Triage Notes (Signed)
 FIRST NURSE NOTE:  Pt arrived via w/c from [facility], pt was there for appt with rheumatology, pt c/o weakness, difficulty breathing and was reported to have had some balance issues this morning.  Per [facility] staff pt had pulse of 44 and oxygen  of 90%.

## 2023-02-25 NOTE — ED Provider Notes (Addendum)
 [facility] Provider Note    Event Date/Time   First MD Initiated Contact with Patient 02/25/23 1159     (approximate)   History   Chief Complaint: Extremity Weakness and Facial Droop   HPI  Natasha Vasquez is a 83 y.o. female with a history of hypertension, GERD, diabetes, hyperlipidemia who comes ED complaining of feeling off balance, where she had a stroke last night.  Reports that she was in her usual state of health at bedtime last night.  Woke up in the middle the night at 3:30 AM continuing to feel normal.  She woke up at 7 AM this morning noticing that her balance and coordination were off and that she felt like she was going to fall with standing.  Also reports right side of her body feels weaker.  Denies any head trauma or vision changes.  Denies headache.          Physical Exam   Triage Vital Signs: ED Triage Vitals [02/25/23 1109]  Encounter Vitals Group     BP (!) 148/81     Systolic BP Percentile      Diastolic BP Percentile      Pulse Rate 74     Resp 18     Temp 97.7 F (36.5 C)     Temp Source Oral     SpO2 94 %     Weight      Height      Head Circumference      Peak Flow      Pain Score 0     Pain Loc      Pain Education      Exclude from Growth Chart     Most recent vital signs: Vitals:   02/25/23 1109  BP: (!) 148/81  Pulse: 74  Resp: 18  Temp: 97.7 F (36.5 C)  SpO2: 94%    General: Awake, no distress.  CV:  Good peripheral perfusion.  Regular rate rhythm Resp:  Normal effort.  Clear to auscultation bilaterally Abd:  No distention.  Soft nontender Other:  Normal orientation.  Normal language.  Mild dysarthria.  Mild drift in right arm and right leg.  No nystagmus.  Visual fields intact.  There is limb ataxia in right arm and right leg as well.  NIH stroke scale 5.   ED Results / Procedures / Treatments   Labs (all labs ordered are listed, but only abnormal results are displayed) Labs Reviewed   COMPREHENSIVE METABOLIC PANEL - Abnormal; Notable for the following components:      Result Value   Glucose, Bld 135 (*)    All other components within normal limits  CBC WITH DIFFERENTIAL/PLATELET  PROTIME-INR  APTT  MAGNESIUM  TSH  T4, FREE  MICROALBUMIN / CREATININE URINE RATIO  URINALYSIS, W/ REFLEX TO CULTURE (INFECTION SUSPECTED)  TROPONIN I (HIGH SENSITIVITY)     EKG Interpreted by me Sinus rhythm rate of 70.  Left axis, poor R wave progression branch block.  No acute ischemic changes.   RADIOLOGY CT head interpreted by me, no mass or intracranial hemorrhage.  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   PROCEDURES:  .Critical Care  Performed by: Jacquie Maudlin, MD Authorized by: Jacquie Maudlin, MD   Critical care provider statement:    Critical care time (minutes):  35   Critical care time was exclusive of:  Separately billable procedures and treating other patients   Critical care was necessary to treat or  prevent imminent or life-threatening deterioration of the following conditions:  CNS failure or compromise   Critical care was time spent personally by me on the following activities:  Development of treatment plan with patient or surrogate, discussions with consultants, evaluation of patient's response to treatment, examination of patient, obtaining history from patient or surrogate, ordering and performing treatments and interventions, ordering and review of laboratory studies, ordering and review of radiographic studies, pulse oximetry, re-evaluation of patient's condition and review of old charts   Care discussed with: admitting provider      MEDICATIONS ORDERED IN ED: Medications  aspirin  chewable tablet 324 mg (has no administration in time range)     IMPRESSION / MDM / ASSESSMENT AND PLAN / ED COURSE  I reviewed the triage vital signs and the nursing notes.  DDx: Stroke, intracranial hemorrhage, cerebral mass,  electrolyte derangement  Patient's presentation is most consistent with acute presentation with potential threat to life or bodily function.  Patient presents with loss of balance and coordination, last known well 3:30 AM today.  Negative for aphasia or neglect or visual field loss.  Advised patient she will need hospitalization for acute stroke management.  Discussed with neurology who recommends obtaining  CT angiogram head neck prior to contacting hospitalist to ensure no LVO.  Patient failed swallow screen, will give PR aspirin .  ----------------------------------------- 2:44 PM on 02/25/2023 ----------------------------------------- Still awaiting CT angiogram.  Plan to admit       FINAL CLINICAL IMPRESSION(S) / ED DIAGNOSES   Final diagnoses:  Cerebellar stroke, acute (HCC)     Rx / DC Orders   ED Discharge Orders     None        Note:  This document was prepared using Dragon voice recognition software and may include unintentional dictation errors.   Jacquie Maudlin, MD 02/25/23 1227    Jacquie Maudlin, MD 02/25/23 [phone]

## 2023-02-25 NOTE — ED Notes (Signed)
 Report to [facility] RN [place] [address] bed 7 pt updated pt on cellphone updating family 20g piv to lac patent secured cdi

## 2023-02-25 NOTE — ED Notes (Signed)
 ..  EMTALA: REQUIRED DOCUMENTATION COMPLETED AND REVIEWED BY WRITER PRIOR TO PT TRANSFER MD REASSESSMENT EMTALA RN SECTION TRANSFER E-SIGN VS WITHIN REQUIRED TIME

## 2023-02-25 NOTE — ED Provider Triage Note (Signed)
 Emergency Medicine Provider Triage Evaluation Note  Natasha Vasquez , a 83 y.o. female  was evaluated in triage.  Pt complains of stroke like symptoms that occurred last night. Patient endorses right sided weakness and feels off balance when walking. Currently feels normal. Last known well was last night.   Review of Systems  Positive: Right sided weakness, right hand tingling, SOB Negative:   Physical Exam  BP (!) 148/81   Pulse 74   Temp 97.7 F (36.5 C) (Oral)   Resp 18   SpO2 94%  Gen:   Awake, no distress   Resp:  Normal effort  MSK:   Moves extremities without difficulty  Other:    Medical Decision Making  Medically screening exam initiated at 11:10 AM.  Appropriate orders placed.  Natasha Vasquez was informed that the remainder of the evaluation will be completed by another provider, this initial triage assessment does not replace that evaluation, and the importance of remaining in the ED until their evaluation is complete.     Phyliss Breen, PA-C 02/25/23 1112

## 2023-02-25 NOTE — H&P (Signed)
 History and Physical    Patient: Phi Cerezo ZOX:[id number] DOB: [birth year] DOA: 02/25/2023 DOS: the patient was seen and examined on 02/25/2023 PCP: Mazie Speed, MD  Patient coming from:  [facility]- Dr.Siadecki Chief complaint: Chief Complaint  Patient presents with   Extremity Weakness   Facial Droop   HPI:  Lesley Vaneta Barbe is a 83 y.o. female with past medical history  of  hypertension, GERD, diabetes, hyperlipidemia, coming [facility] per neuro recommendations : " Plan is for IR diagnostic arteriogram Wednesday with intent to treat. We do not need vascular surgery consult at this time. " Pt at [facility] for weakness and facial droop LKN was 3:30 am, pt noted being off balance since the morning and some right sided weakness no falls no trauma to head. Will accept pt to cardiac tele and request neuro to follow.    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  CTA is abnormal showing: Calcified plaque at the left carotid bifurcation and ICA bulb. Minimal diameter in the ICA bulb less than 1 mm. Near occlusion. Vessel regains a more normal caliber at the distal bulb and is patent through the cervical region to the skull base beyond that. This is of particular importance in this patient as the left internal carotid artery supplies the majority of the brain, as the right ICA essentially gives isolated supply to the right middle cerebral artery territory only. Urgent vascular surgery consultation suggested.  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  ER/PR NEGATIVE   Cataract    Chronic cough    Complication of anesthesia    20 years ago pts b/p dropped really low, "coded"   Depression    Depression    Diabetes mellitus without complication (HCC)    diet controlled   Family history of adverse reaction to anesthesia    son PONV   GERD (gastroesophageal reflux disease)    Gout    Hypercholesteremia    Hyperlipidemia    Hypertension    Melanoma (HCC)    MVP (mitral valve prolapse)    followed by PCP   Nephritis    Nephritis    S/P appendectomy    Thoracic aortic aneurysm (HCC)    Torn meniscus    left   Past Surgical History:  Procedure Laterality Date   ABDOMINAL HYSTERECTOMY     APPENDECTOMY     BREAST BIOPSY Right 04/25/2017  retroareolar 10:00   wing clip   path pending   BREAST BIOPSY Right 04/25/2017   10:00  5CMFN  venus clip    path pending   BREAST BIOPSY Right 05/03/2017   Affirm Bx- path pending   BREAST CYST ASPIRATION Bilateral    NEG   BREAST EXCISIONAL BIOPSY Left 20+ yrs ago   NEG   BREAST RECONSTRUCTION WITH PLACEMENT OF TISSUE EXPANDER AND FLEX HD (ACELLULAR HYDRATED DERMIS) Right 06/03/2017   Procedure: BREAST RECONSTRUCTION WITH PLACEMENT OF TISSUE EXPANDER AND FLEX HD (ACELLULAR HYDRATED DERMIS);  Surgeon: Thornell Flirt, DO;  Location: [facility] ORS;  Service: Plastics;  Laterality: Right;   BREAST REDUCTION SURGERY Left 11/06/2017   Procedure: BREAST REDUCTION;  Surgeon: Thornell Flirt, DO;  Location: [facility] OR[facility];  Service: Plastics;  Laterality: Left;   BROW LIFT Bilateral 02/01/2015   Procedure: BLEPHAROPLASTY bilateral upper eyelids.;  Surgeon: Zacarias Hermann, MD;  Location: [facility];  Service: Ophthalmology;  Laterality: Bilateral;  DIABETIC - diet controlled   CATARACT EXTRACTION W/PHACO Left 01/14/2019   Procedure: CATARACT EXTRACTION PHACO AND INTRAOCULAR LENS PLACEMENT (IOC) LEFT TECNIS TORIC ADD 5.62 00:46.5 30.0%;  Surgeon: Annell Kidney, MD;  Location: [facility];  Service: Ophthalmology;  Laterality: Left;  diabetic - diet controlled   CATARACT EXTRACTION W/PHACO Right 02/11/2019   Procedure: CATARACT EXTRACTION PHACO AND INTRAOCULAR LENS PLACEMENT (IOC) RIGHT DIABETIC 4.82 00:46.9 10.3%;  Surgeon: Annell Kidney, MD;  Location: [facility];  Service: Ophthalmology;  Laterality: Right;  Diabetic - diet controlled   CHOLECYSTECTOMY     COLONOSCOPY     COLONOSCOPY WITH PROPOFOL  N/A 05/11/2015   Procedure: COLONOSCOPY WITH PROPOFOL ;  Surgeon: Cassie Click, MD;  Location: [facility];  Service: Endoscopy;  Laterality: N/A;   COLONOSCOPY WITH PROPOFOL  N/A 09/07/2020   Procedure: COLONOSCOPY WITH PROPOFOL ;  Surgeon: Toledo, Alphonsus Jeans, MD;  Location: [facility] ENDOSCOPY;  Service: Gastroenterology;  Laterality: N/A;   ESOPHAGOGASTRODUODENOSCOPY     ESOPHAGOGASTRODUODENOSCOPY (EGD) WITH PROPOFOL   05/11/2015   Procedure: ESOPHAGOGASTRODUODENOSCOPY (EGD) WITH PROPOFOL ;  Surgeon: Cassie Click, MD;  Location: [facility];  Service: Endoscopy;;   FRACTURE SURGERY Right    arm and shoulder   JOINT REPLACEMENT     KNEE ARTHROSCOPY Left 03/14/2016   Procedure: ARTHROSCOPY KNEE, PARTIAL MEDIAL MENISECTOMY, CHONDROPLASTY;  Surgeon: Arlyne Lame, MD;  Location: [facility];  Service: Orthopedics;  Laterality: Left;   MASTECTOMY Right 2019   MASTECTOMY W/ SENTINEL NODE BIOPSY Right 06/03/2017   Procedure: MASTECTOMY WITH SENTINEL LYMPH NODE  BIOPSY;  Surgeon: Marshall Skeeter, MD;  Location: [facility];  Service: General;  Laterality: Right;   PARTIAL HIP ARTHROPLASTY Right    REDUCTION MAMMAPLASTY Left 2019   REMOVAL OF BILATERAL TISSUE EXPANDERS WITH PLACEMENT OF BILATERAL BREAST IMPLANTS Right 11/06/2017   Procedure: REMOVAL OF TISSUE EXPANDER WITH PLACEMENT OF BREAST IMPLANT;  Surgeon: Thornell Flirt, DO;  Location: [facility] OR[facility];  Service: Plastics;  Laterality: Right;   TONSILLECTOMY      reports that she quit smoking about 60 years ago. Her smoking use included cigarettes. She started smoking about 62 years ago. She has never used smokeless tobacco. She reports current alcohol use of about 7.0 - 14.0 standard drinks of alcohol per week. She reports that she does not use drugs.  Allergies  Allergen Reactions   Codeine Other (See Comments)    Chest pain   Lorazepam Other (See Comments)    "Feels loopy"   Oxycodone   Other (See Comments)    MENTAL CHANGES   Paregoric     Chest pain---tolerated MORPHINE    Penicillins Hives    Has patient had a PCN reaction causing immediate rash, facial/tongue/throat swelling, SOB or lightheadedness with hypotension: Yes Has patient had a PCN reaction causing severe rash involving mucus membranes or skin necrosis: No Has patient had a PCN reaction that required hospitalization: NO Has patient had a PCN reaction occurring within the last 10 years: No If all of the above answers are "NO", then may proceed with Cephalosporin use.  Can take Augmentin   Quinolones     PATIENT UNAWARE OF allergy to this class of antibiotics, only aware of allergy to [facility] Patient was warned about not using Cipro  and similar antibiotics. Recent studies have raised concern that fluoroquinolone antibiotics could be associated with an increased risk of aortic aneurysm Fluoroquinolones have non-antimicrobial properties that might jeopardise the integrity of the extracellular matrix of the vascular wall In a   propensity score matched cohort study in [place], there was a 66% increased rate   Aleve [Naproxen Sodium] Hives, Rash and Other (See Comments)    headaches    Family History  Problem Relation Age of Onset   Cancer Mother        colon/rectal   Colon cancer Mother    Heart disease Father    Hypertension Father    Atrial fibrillation Father    Diabetes Father    Cancer Brother        lung   Healthy Son    Healthy Son    Breast cancer Neg Hx     Prior to Admission medications   Medication Sig Start Date End Date Taking? Authorizing Provider  acetaminophen  (TYLENOL ) 500 MG tablet Take 500 mg by mouth every 6 (six) hours as needed.    [provider]  albuterol  (VENTOLIN  HFA) 108 (90 Base) MCG/ACT inhaler TAKE 2 PUFFS BY MOUTH EVERY 6 HOURS AS NEEDED FOR WHEEZE OR SHORTNESS OF BREATH 05/29/21   Marc Senior, MD  allopurinol  (ZYLOPRIM ) 100 MG tablet TAKE 2 TABLETS BY MOUTH EVERY DAY 10/25/22   Bacigalupo, Angela M, MD  ARIPiprazole  (ABILIFY ) 2 MG tablet TAKE 1 TABLET BY MOUTH EVERY DAY 10/30/22   Bacigalupo, Stan Eans, MD  aspirin  81 MG tablet Take 81 mg by mouth daily.    [provider]  bismuth  subsalicylate (PEPTO BISMOL) 262 MG chewable tablet Chew 262 mg by mouth as needed. **capsule**    [provider]  calcium  carbonate (TUMS - DOSED IN MG ELEMENTAL CALCIUM ) 500 MG chewable tablet Chew 1 tablet by mouth as needed for indigestion or heartburn.    [provider]  carboxymethylcellulose (REFRESH PLUS) 0.5 % SOLN Place 1 drop into both eyes 3 (three) times daily as needed (Dry Eyes).    [provider]  carvedilol  (COREG ) 6.25 MG tablet Take 1 tablet (6.25 mg total) by mouth 2 (two) times daily. 01/29/23   Morey Ar, NP  Cholecalciferol (VITAMIN D -3 PO) Take 1 tablet by mouth daily.    [provider]  colestipol  (COLESTID ) 1 g tablet TAKE 1 TABLET BY MOUTH 2 TIMES DAILY. 10/30/22   Bacigalupo, Angela M, MD   famotidine  (PEPCID ) 20 MG tablet TAKE 1 TABLET BY MOUTH TWICE A DAY 10/08/22   Mazie Speed, MD  ferrous sulfate  324 MG TBEC Take 324 mg by mouth daily with breakfast.    [provider]  fluocinonide (LIDEX) 0.05 % external solution  APPLY TO AFFECTED AREAS OF SCALP DAILY UNTIL CLEAR, THEN FOR FLARES 10/31/18   [provider]  Fluticasone -Umeclidin-Vilant (TRELEGY ELLIPTA ) 100-62.5-25 MCG/ACT AEPB Inhale 1 puff into the lungs daily. 08/08/22   Marc Senior, MD  furosemide  (LASIX ) 20 MG tablet TAKE 1 TABLET BY MOUTH EVERY DAY 06/04/22   Gollan, Timothy J, MD  lisinopril  (ZESTRIL ) 10 MG tablet Take 1 tablet (10 mg total) by mouth daily. 05/01/22   Bacigalupo, Angela M, MD  Multiple Vitamins-Minerals (CENTRUM SILVER PO) Take 1 tablet by mouth daily.    [provider]  omeprazole  (PRILOSEC) 40 MG capsule TAKE 1 CAPSULE BY MOUTH TWICE A DAY 10/25/22   Mazie Speed, MD  oxybutynin  (DITROPAN -XL) 10 MG 24 hr tablet TAKE 1 TABLET BY MOUTH EVERY DAY 10/25/22   Bacigalupo, Angela M, MD  potassium chloride  (KLOR-CON ) 10 MEQ tablet TAKE 1 TABLET BY MOUTH EVERY DAY 06/04/22   Gollan, Timothy J, MD  rosuvastatin  (CRESTOR ) 10 MG tablet TAKE 1 TABLET BY MOUTH EVERY DAY 10/30/22   Bacigalupo, Angela M, MD  tirzepatide (MOUNJARO) 2.5 MG/0.5ML Pen Inject 2.5 mg into the skin once a week. 02/11/23   Bacigalupo, Angela M, MD  tirzepatide [facility]) 5 MG/0.5ML Pen Inject 5 mg into the skin once a week. 02/11/23   Bacigalupo, Angela M, MD  Venlafaxine  HCl 225 MG TB24 TAKE 1 TABLET (225 MG TOTAL) BY MOUTH DAILY. PLEASE SCHEDULE OFFICE VISIT BEFORE ANY FUTURE REFILLS 09/28/22   Mazie Speed, MD     Vitals:   02/25/23 1109 02/25/23 1315 02/25/23 1415 02/25/23 1441  BP: (!) 148/81   (!) 150/60  Pulse: 74 74 70 82  Resp: 18 18 17 18   Temp: 97.7 F (36.5 C)     TempSrc: Oral     SpO2: 94% 95% 98% 95%   Physical Exam  PENDING.  Labs on Admission: I have personally  reviewed following labs and imaging studies Results for orders placed or performed during the hospital encounter of 02/25/23 (from the past 24 hours)  CBC with Differential     Status: None   Collection Time: 02/25/23 11:13 AM  Result Value Ref Range   WBC 7.1 4.0 - 10.5 K/uL   RBC 4.23 3.87 - 5.11 MIL/uL   Hemoglobin 12.4 12.0 - 15.0 g/dL   HCT 78.2 95.6 - 21.3 %   MCV 90.8 80.0 - 100.0 fL   MCH 29.3 26.0 - 34.0 pg   MCHC 32.3 30.0 - 36.0 g/dL   RDW 08.6 57.8 - 46.9 %   Platelets 301 150 - 400 K/uL   nRBC 0.0 0.0 - 0.2 %   Neutrophils Relative % 71 %   Neutro Abs 5.1 1.7 - 7.7 K/uL   Lymphocytes Relative 18 %   Lymphs Abs 1.3 0.7 - 4.0 K/uL   Monocytes Relative 7 %   Monocytes Absolute 0.5 0.1 - 1.0 K/uL   Eosinophils Relative 2 %   Eosinophils Absolute 0.1 0.0 - 0.5 K/uL   Basophils Relative 1 %   Basophils Absolute 0.1 0.0 - 0.1 K/uL   Immature Granulocytes 1 %   Abs Immature Granulocytes 0.05 0.00 - 0.07 K/uL  Comprehensive metabolic panel     Status: Abnormal   Collection Time: 02/25/23 11:13 AM  Result Value Ref Range   Sodium 140 135 - 145 mmol/L   Potassium 3.8 3.5 - 5.1 mmol/L   Chloride 105 98 - 111 mmol/L   CO2 25 22 - 32 mmol/L  Glucose, Bld 135 (H) 70 - 99 mg/dL   BUN 14 8 - 23 mg/dL   Creatinine, Ser 2.95 0.44 - 1.00 mg/dL   Calcium  9.3 8.9 - 10.3 mg/dL   Total Protein 7.3 6.5 - 8.1 g/dL   Albumin 4.5 3.5 - 5.0 g/dL   AST 34 15 - 41 U/L   ALT 40 0 - 44 U/L   Alkaline Phosphatase 71 38 - 126 U/L   Total Bilirubin 0.6 0.0 - 1.2 mg/dL   GFR, Estimated >62 >13 mL/min   Anion gap 10 5 - 15  Troponin I (High Sensitivity)     Status: None   Collection Time: 02/25/23 11:13 AM  Result Value Ref Range   Troponin I (High Sensitivity) 7 <18 ng/L  Protime-INR     Status: None   Collection Time: 02/25/23 11:13 AM  Result Value Ref Range   Prothrombin Time 13.8 11.4 - 15.2 seconds   INR 1.0 0.8 - 1.2  APTT     Status: None   Collection Time: 02/25/23 11:13 AM   Result Value Ref Range   aPTT 28 24 - 36 seconds  Magnesium     Status: None   Collection Time: 02/25/23 11:13 AM  Result Value Ref Range   Magnesium 2.0 1.7 - 2.4 mg/dL  TSH     Status: None   Collection Time: 02/25/23 12:37 PM  Result Value Ref Range   TSH 3.325 0.350 - 4.500 uIU/mL  T4, free     Status: None   Collection Time: 02/25/23 12:37 PM  Result Value Ref Range   Free T4 0.63 0.61 - 1.12 ng/dL  Urinalysis, w/ Reflex to Culture (Infection Suspected) -Urine, Clean Catch     Status: Abnormal   Collection Time: 02/25/23 12:37 PM  Result Value Ref Range   Specimen Source URINE, CLEAN CATCH    Color, Urine YELLOW (A) YELLOW   APPearance CLEAR (A) CLEAR   Specific Gravity, Urine 1.015 1.005 - 1.030   pH 5.0 5.0 - 8.0   Glucose, UA NEGATIVE NEGATIVE mg/dL   Hgb urine dipstick NEGATIVE NEGATIVE   Bilirubin Urine NEGATIVE NEGATIVE   Ketones, ur NEGATIVE NEGATIVE mg/dL   Protein, ur NEGATIVE NEGATIVE mg/dL   Nitrite NEGATIVE NEGATIVE   Leukocytes,Ua NEGATIVE NEGATIVE   RBC / HPF 0-5 0 - 5 RBC/hpf   WBC, UA 0-5 0 - 5 WBC/hpf   Bacteria, UA NONE SEEN NONE SEEN   Squamous Epithelial / HPF 0-5 0 - 5 /HPF   Mucus PRESENT    No results found for this or any previous visit (from the past 720 hours). CBC:    Latest Ref Rng & Units 02/25/2023   11:13 AM 09/07/2022    4:04 PM 05/08/2021    8:03 AM  CBC  WBC 4.0 - 10.5 K/uL 7.1  10.4  9.9   Hemoglobin 12.0 - 15.0 g/dL 08.6  57.8  46.9   Hematocrit 36.0 - 46.0 % 38.4  40.1  35.8   Platelets 150 - 400 K/uL 301  341  331    Basic Metabolic Panel: Recent Labs  Lab 02/25/23 1113  NA 140  K 3.8  CL 105  CO2 25  GLUCOSE 135*  BUN 14  CREATININE 0.86  CALCIUM  9.3  MG 2.0   Creatinine: Lab Results  Component Value Date   CREATININE 0.86 02/25/2023   CREATININE 1.22 (H) 02/11/2023   CREATININE 0.91 05/18/2022   Liver Function Tests:    Latest Ref  Rng & Units 02/25/2023   11:13 AM 02/11/2023    9:45 AM 05/18/2022    4:10  PM  Hepatic Function  Total Protein 6.5 - 8.1 g/dL 7.3  7.0  6.8   Albumin 3.5 - 5.0 g/dL 4.5  4.7  4.6   AST 15 - 41 U/L 34  30  21   ALT 0 - 44 U/L 40  34  21   Alk Phosphatase 38 - 126 U/L 71  105  102   Total Bilirubin 0.0 - 1.2 mg/dL 0.6  0.4  0.2    Coagulation Profile: Recent Labs  Lab 02/25/23 1113  INR 1.0   Cardiac Enzymes: No results for input(s): "CKTOTAL", "CKMB", "CKMBINDEX", "TROPONINI" in the last 168 hours. BNP (last 3 results) No results for input(s): "PROBNP" in the last 8760 hours. HbA1C: No results for input(s): "HGBA1C" in the last 72 hours. Lipid Profile: No results for input(s): "CHOL", "HDL", "LDLCALC", "TRIG", "CHOLHDL", "LDLDIRECT" in the last 72 hours.  Radiological Exams on Admission: CT ANGIO HEAD NECK W WO CM Result Date: 02/25/2023 CLINICAL DATA:  Neuro deficit, acute, stroke suspected. Extremity weakness. Facial droop. Hypertension and diabetes. EXAM: CT ANGIOGRAPHY HEAD AND NECK WITH AND WITHOUT CONTRAST TECHNIQUE: Multidetector CT imaging of the head and neck was performed using the standard protocol during bolus administration of intravenous contrast. Multiplanar CT image reconstructions and MIPs were obtained to evaluate the vascular anatomy. Carotid stenosis measurements (when applicable) are obtained utilizing NASCET criteria, using the distal internal carotid diameter as the denominator. RADIATION DOSE REDUCTION: This exam was performed according to the departmental dose-optimization program which includes automated exposure control, adjustment of the mA and/or kV according to patient size and/or use of iterative reconstruction technique. CONTRAST:  60mL OMNIPAQUE  IOHEXOL  350 MG/ML SOLN COMPARISON:  Head CT earlier same day.  Ultrasound 04/28/2021. FINDINGS: CTA NECK FINDINGS Aortic arch: Aortic atherosclerosis. Branching pattern is normal without origin stenosis. Right carotid system: Common carotid artery widely patent to the bifurcation. No soft or  calcified plaque at the bifurcation. Right ICA does not show any focal stenosis but is smaller than the left. Left carotid system: Common carotid artery widely patent to the bifurcation. Calcified plaque at the carotid bifurcation and ICA bulb. Minimal diameter in the ICA bulb less than 1 mm. Near occlusion. Vessel regains a more normal caliber at the distal bulb and is patent through the cervical region to the skull base beyond that. Vertebral arteries: Both vertebral artery origins widely patent. Both vertebral arteries patent through the cervical region to the foramen magnum. Skeleton: Chronic mid cervical spondylosis. Other neck: No mass or lymphadenopathy. Upper chest: Scarring at the lung apices.  No acute finding. Review of the MIP images confirms the above findings CTA HEAD FINDINGS Anterior circulation: Both internal carotid arteries are patent through the skull base and siphon regions. Ordinary siphon atherosclerotic calcification but no stenosis greater than 30%. The anterior and middle cerebral vessels are patent. Both anterior cerebral arteries receive there supply from the left carotid circulation. Right internal carotid artery supplies only the middle cerebral artery territory. No large vessel occlusion. No proximal stenosis. No aneurysm or vascular malformation. Posterior circulation: Both vertebral arteries patent through the foramen magnum to the basilar artery. No basilar stenosis. Posterior circulation branch vessels are patent. Left PCA receives most of it supply from the anterior circulation. Venous sinuses: Patent and normal. Anatomic variants: None significant otherwise. Review of the MIP images confirms the above findings IMPRESSION: 1. Aortic atherosclerosis. 2.  Calcified plaque at the left carotid bifurcation and ICA bulb. Minimal diameter in the ICA bulb less than 1 mm. Near occlusion. Vessel regains a more normal caliber at the distal bulb and is patent through the cervical region to the  skull base beyond that. This is of particular importance in this patient as the left internal carotid artery supplies the majority of the brain, as the right ICA essentially gives isolated supply to the right middle cerebral artery territory only. Urgent vascular surgery consultation suggested. 3. No intracranial large or medium vessel occlusion or correctable proximal stenosis. Electronically Signed   By: Bettylou Brunner M.D.   On: 02/25/2023 15:13   CT Head Wo Contrast Result Date: 02/25/2023 CLINICAL DATA:  Neuro deficit, acute, stroke suspected EXAM: CT HEAD WITHOUT CONTRAST TECHNIQUE: Contiguous axial images were obtained from the base of the skull through the vertex without intravenous contrast. RADIATION DOSE REDUCTION: This exam was performed according to the departmental dose-optimization program which includes automated exposure control, adjustment of the mA and/or kV according to patient size and/or use of iterative reconstruction technique. COMPARISON:  None Available. FINDINGS: Brain: No hemorrhage. No hydrocephalus. No extra-axial fluid collection. No mass effect. No mass lesion. 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 Skull: Normal. Negative for fracture or focal lesion. Sinuses/Orbits: No middle ear or mastoid effusion. Paranasal sinuses are clear. Bilateral lens replacement. Orbits are otherwise unremarkable. Other: None. 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 Consider further evaluation with MRI. Electronically Signed   By: Clora Dane M.D.   On: 02/25/2023 11:53    Data Reviewed: Relevant notes from primary care and specialist visits, past discharge summaries as available in EHR, including Care Everywhere. Prior diagnostic testing as pertinent to current admission diagnoses, Updated medications and problem lists for reconciliation ED course, including vitals, labs, imaging,  treatment and response to treatment,Triage notes, nursing and pharmacy notes and ED provider's notes Notable results as noted in HPI.Discussed case with EDMD/ ED APP/ or Specialty MD on call and as needed.  >>Assessment and Plan: >>Dizziness / Right sided weakness/ Abnormal CTA neck: -Neurology consult.  -ASA 300 as pt failed swallow eval. -"[facility]. I will do neurology consult in [facility] ED and then neuro will continue to follow at [facility]. Plan is for IR diagnostic arteriogram Wednesday with intent to treat. We do not need vascular surgery consult at this time."   DVT prophylaxis:  Heparin   Consults:  Neuro. Advance Care Planning:    Code Status: Prior   Family Communication:  None. Disposition Plan:  Home. Severity of Illness: The appropriate patient status for this patient is INPATIENT. Inpatient status is judged to be reasonable and necessary in order to provide the required intensity of service to ensure the patient's safety. The patient's presenting symptoms, physical exam findings, and initial radiographic and laboratory data in the context of their chronic comorbidities is felt to place them at high risk for further clinical deterioration. Furthermore, it is not anticipated that the patient will be medically stable for discharge from the hospital within 2 midnights of admission.   * I certify that at the point of admission it is my clinical judgment that the patient will require inpatient hospital care spanning beyond 2 midnights from the point of admission due to high intensity of service, high risk for further deterioration and high frequency of surveillance required.*  Author: Lavanda Porter, MD 02/25/2023  4:02 PM  For on call review [email or web address].   Unresulted Labs (From admission, onward)     Start     Ordered   02/25/23 1219  Microalbumin / creatinine urine ratio  Once,   URGENT        02/25/23 1219            Orders  Placed This Encounter  Procedures   Critical Care   CT Head Wo Contrast   CT ANGIO HEAD NECK W WO CM   CBC with Differential   Comprehensive metabolic panel   Protime-INR   APTT   Magnesium   TSH   T4, free   Microalbumin / creatinine urine ratio   Urinalysis, w/ Reflex to Culture (Infection Suspected) -Urine, Clean Catch   NIH Stroke Scale   Swallow screen   SLP eval and treat Reason for evaluation: .Swallowing evaluation (BSE, MBS and/or diet order as indicated) Patient failed swallow screen. Drank water, but coughed/choked afterwards.   EKG 12-Lead   ED EKG

## 2023-02-25 NOTE — Evaluation (Addendum)
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  ER/PR NEGATIVE   Cataract    Chronic cough    Complication of anesthesia    20 years ago pts b/p dropped really low, "coded"   Depression    Depression    Diabetes mellitus without complication (HCC)    diet controlled   Family history of adverse reaction to anesthesia    son PONV   GERD (gastroesophageal reflux disease)    Gout    Hypercholesteremia    Hyperlipidemia    Hypertension    Melanoma (HCC)    MVP (mitral valve prolapse)    followed by PCP   Nephritis    Nephritis    S/P appendectomy    Thoracic aortic aneurysm (HCC)    Torn meniscus    left   Past Surgical History:  Past Surgical History:  Procedure Laterality Date   ABDOMINAL HYSTERECTOMY     APPENDECTOMY     BREAST BIOPSY Right 04/25/2017   retroareolar 10:00   wing clip   path pending   BREAST BIOPSY Right 04/25/2017   10:00  5CMFN  venus clip    path pending   BREAST BIOPSY Right 05/03/2017   Affirm Bx- path pending   BREAST CYST ASPIRATION Bilateral    NEG   BREAST EXCISIONAL BIOPSY Left 20+ yrs ago   NEG   BREAST RECONSTRUCTION WITH PLACEMENT OF TISSUE EXPANDER AND FLEX HD (ACELLULAR HYDRATED DERMIS) Right 06/03/2017   Procedure: BREAST RECONSTRUCTION WITH PLACEMENT OF TISSUE EXPANDER AND FLEX HD (ACELLULAR HYDRATED DERMIS);  Surgeon: Peggye Form, DO;  Location: [facility];  Service: Plastics;  Laterality: Right;   BREAST REDUCTION SURGERY Left 11/06/2017   Procedure: BREAST REDUCTION;  Surgeon: Peggye Form, DO;  Location: [facility];  Service: Plastics;  Laterality: Left;   BROW LIFT Bilateral 02/01/2015   Procedure: BLEPHAROPLASTY bilateral upper eyelids.;  Surgeon: Imagene Riches, MD;  Location: [facility];  Service: Ophthalmology;  Laterality: Bilateral;  DIABETIC - diet controlled   CATARACT EXTRACTION W/PHACO Left 01/14/2019   Procedure: CATARACT EXTRACTION PHACO AND INTRAOCULAR LENS PLACEMENT (IOC) LEFT TECNIS TORIC ADD 5.62 00:46.5 30.0%;  Surgeon: Lockie Mola, MD;  Location: [facility];  Service: Ophthalmology;  Laterality: Left;  diabetic - diet controlled   CATARACT EXTRACTION W/PHACO Right 02/11/2019   Procedure: CATARACT EXTRACTION PHACO AND INTRAOCULAR LENS PLACEMENT (IOC) RIGHT DIABETIC 4.82 00:46.9 10.3%;  Surgeon: Lockie Mola, MD;  Location: [facility];  Service: Ophthalmology;  Laterality: Right;  Diabetic - diet controlled   CHOLECYSTECTOMY     COLONOSCOPY     COLONOSCOPY WITH PROPOFOL N/A 05/11/2015   Procedure: COLONOSCOPY WITH PROPOFOL;  Surgeon: Scot Jun, MD;  Location: [facility];  Service: Endoscopy;  Laterality: N/A;   COLONOSCOPY WITH PROPOFOL N/A 09/07/2020   Procedure: COLONOSCOPY WITH PROPOFOL;  Surgeon: Toledo, Boykin Nearing, MD;  Location: [facility];  Service: Gastroenterology;  Laterality: N/A;   ESOPHAGOGASTRODUODENOSCOPY     ESOPHAGOGASTRODUODENOSCOPY (EGD) WITH PROPOFOL  05/11/2015  Procedure: ESOPHAGOGASTRODUODENOSCOPY (EGD) WITH PROPOFOL;  Surgeon: Scot Jun, MD;  Location: [facility];  Service: Endoscopy;;   FRACTURE SURGERY Right    arm and shoulder   JOINT REPLACEMENT     KNEE ARTHROSCOPY Left 03/14/2016   Procedure: ARTHROSCOPY KNEE, PARTIAL MEDIAL MENISECTOMY, CHONDROPLASTY;  Surgeon: Donato Heinz, MD;  Location: [facility];  Service: Orthopedics;  Laterality: Left;   MASTECTOMY Right 2019   MASTECTOMY W/ SENTINEL NODE BIOPSY Right 06/03/2017   Procedure: MASTECTOMY WITH SENTINEL LYMPH  NODE BIOPSY;  Surgeon: Earline Mayotte, MD;  Location: [facility];  Service: General;  Laterality: Right;   PARTIAL HIP ARTHROPLASTY Right    REDUCTION MAMMAPLASTY Left 2019   REMOVAL OF BILATERAL TISSUE EXPANDERS WITH PLACEMENT OF BILATERAL BREAST IMPLANTS Right 11/06/2017   Procedure: REMOVAL OF TISSUE EXPANDER WITH PLACEMENT OF BREAST IMPLANT;  Surgeon: Peggye Form, DO;  Location: [facility];  Service: Plastics;  Laterality: Right;   TONSILLECTOMY     HPI:  Pt is a 83 y.o. female with a history of Chronic diastolic CHF, emphysema, sleep apnea per pt, hypertension, Hyperlipidemia, Barrett esophagus, GERD, Bell's Palsy - not sure of affected side(?), Diabetes w/ Diabetic peripheral neuropathy associated with type 2 diabetes mellitus, MDD, hyperlipidemia who comes ED complaining of feeling off balance, where she had a stroke last night.  Reports that she was in her usual state of health at bedtime last night.  Woke up in the middle the night at 3:30 AM continuing to feel normal.  She woke up at 7 AM this morning noticing that her balance and coordination were off and that she felt like she was going to fall with standing.  Also reports right side of her body feels weaker.   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 MRI pending.  CT Angio Head:  Aortic atherosclerosis.  2. Calcified plaque at the left carotid bifurcation and ICA bulb.  Minimal diameter in the ICA bulb less than 1 mm. Near occlusion.  Vessel regains a more normal caliber at the distal bulb and is  patent through the cervical region to the skull base beyond that.  This is of particular importance in this patient as the left  internal carotid artery supplies the majority of the brain, as the  right ICA essentially gives isolated supply to the right middle  cerebral artery territory only. Urgent vascular surgery consultation  suggested.  3. No intracranial large or medium vessel occlusion or correctable  proximal  stenosis.    Assessment / Plan / Recommendation  Clinical Impression   Pt seen for BSE. Pt awake, conversed verbally w/ this SLP, Son and MD in room. Pt's speech was fully intelligible but rate was slightly dysfluent -- mild hesitations noted. Pt stated it "has been this way for about a year now" (she thought it was somehow related to her baseline Dyslexia). Pt followed instructions appropriately. No cognitive-linguistic deficits noted during engagement. Noted min RUE decreased coordination when self-feeding/holding cup; pt reported similar in RLE.  On RA, afebrile. WBC WNL.  Pt appears to present w/ pharyngeal phase Dysphagia w/ suspected sensorimotor deficits; no overt oral phase swallowing deficits noted. Pt consumed po trials w/ immediate, overt clinical s/s of aspiration during thin and Nectar liquid trials -- inconsistent. W/ strategies in place, no further overt clinical s/s of aspiration noted w/ po trial consistencies presented(no further thin liquids offered at this session w/ current presentation overall, pending procedure).  Pt appears at increased risk for aspiration/aspiration  pneumonia currently; when following aspiration precautions and using swallowing strategy(Head Turn R), this risk appeared reduced. Pt has challenging factors that could impact oropharyngeal swallowing to include Acute neurological changes(R side weakness and reduced coordination) w/ ongoing Acute presentation, hospitalization/procedure, deconditioning, and GERD/Barrett's Esophagus. These factors can increase risk for dysphagia as well as decreased oral intake overall.   During po trials, pt consumed trials of thin liquids via Cup w/ overt, strong coughing noted fairly quickly into trials(1/3) despite following aspiration precautions. Pt stated she felt she had been "coughing some at home too" when drinking thin liquids. A mild cough/throat clear was noted w/ Nectar liquids via Cup x1(1/4); f/u swallow noted. Instructed  pt on Head Turn R for remaining po trials/intake during this evaluation. Pt continued to consume Nectar liquids via Cup(~3 ozs), purees, and soft solids w/ No further overt, clinical s/s of aspiration noted: no overt coughing, decline in vocal quality, nor change in respiratory presentation during/post trials. O2 sats remained 98%. Oral phase appeared [facility] w/ timely bolus management, mastication, and control of bolus propulsion for A-P transfer for swallowing. Oral clearing achieved w/ all trial consistencies -- moistened, soft foods given.  OM Exam revealed mild decreased labial tone/symmetry(CN VII) in R corner of mouth; unsure if related to Bell's Palsy (per pt report unsure of affected side). No overt lingual weakness or consistent asymmetry noted. Velum elevation/symmetry+. Trials of single ice chips were [facility]. Speech fully intelligible. Pt fed self w/ setup support -- noted decreased coordination in RUE.   Recommend initiation of a Dysphagia level 3 (mech soft) consistency diet w/ well-moistened foods; Nectar liquids Via Cup. Recommend general aspiration precautions; swallowing strategy of Head Turn R until further objective assessment of swallowing can be performed. Reduce Talking/distractions at meals. Pills WHOLE in Puree for safer, easier swallowing -- NSG to provide education.  Education given on Pills in Puree; food/Nectar liquid consistencies as part of a Dysphagia diet for now; general aspiration precautions to include Small bites/sips 1 at a time slowly; f/u w/ MBSS when appropriate post Procedure(s) to pt and Son.  MD/NSG updated, agreed. MD updated. ST services will f/u tomorrow w/ ongoing assessment of swallowing. Recommend Dietician f/u for support. SLP Visit Diagnosis: Dysphagia, pharyngeal phase (R13.13)    Aspiration Risk  Mild aspiration risk;Moderate aspiration risk;Risk for inadequate nutrition/hydration    Diet Recommendation   Nectar;Dysphagia 3 (mechanical soft) = initiation of a  Dysphagia level 3 (mech soft) consistency diet w/ well-moistened foods; Nectar liquids Via Cup. Recommend general aspiration precautions; swallowing strategy of Head Turn R until further objective assessment of swallowing can be performed. Reduce Talking/distractions at meals.   Medication Administration: Whole meds with puree    Other  Recommendations Recommended Consults: Consider GI evaluation (for monitoring/education) Oral Care Recommendations: Oral care BID;Oral care before and after PO;Patient independent with oral care (setup) Caregiver Recommendations: Avoid jello, ice cream, thin soups, popsicles;Remove water pitcher;Have oral suction available    Recommendations for follow up therapy are one component of a multi-disciplinary discharge planning process, led by the attending physician.  Recommendations may be updated based on patient status, additional functional criteria and insurance authorization.  Follow up Recommendations Follow physician's recommendations for discharge plan and follow up therapies (next venue of care as needed)      Assistance Recommended at Discharge  Intermittent for support  Functional Status Assessment Patient has had a recent decline in their functional status and demonstrates the ability to make significant improvements in function in a reasonable and  predictable amount of time.  Frequency and Duration min 2x/week  2 weeks       Prognosis Prognosis for improved oropharyngeal function: Good Barriers to Reach Goals:  (none) Barriers/Prognosis Comment: GERD/Barrett's esophagus; bell's palsy baseline      Swallow Study   General Date of Onset: 02/25/23 HPI: Pt is a 83 y.o. female with a history of Chronic diastolic CHF, emphysema, sleep apnea per pt, hypertension, Hyperlipidemia, Barrett esophagus, GERD, Bell's Palsy - not sure of affected side(?), Diabetes w/ Diabetic peripheral neuropathy associated with type 2 diabetes mellitus, MDD, hyperlipidemia  who comes ED complaining of feeling off balance, where she had a stroke last night.  Reports that she was in her usual state of health at bedtime last night.  Woke up in the middle the night at 3:30 AM continuing to feel normal.  She woke up at 7 AM this morning noticing that her balance and coordination were off and that she felt like she was going to fall with standing.  Also reports right side of her body feels weaker.   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 MRI pending.  CT Angio Head:  Aortic atherosclerosis.  2. Calcified plaque at the left carotid bifurcation and ICA bulb.  Minimal diameter in the ICA bulb less than 1 mm. Near occlusion.  Vessel regains a more normal caliber at the distal bulb and is  patent through the cervical region to the skull base beyond that.  This is of particular importance in this patient as the left  internal carotid artery supplies the majority of the brain, as the  right ICA essentially gives isolated supply to the right middle  cerebral artery territory only. Urgent vascular surgery consultation  suggested.  3. No intracranial large or medium vessel occlusion or correctable  proximal stenosis. Type of Study: Bedside Swallow Evaluation Previous Swallow Assessment: none Diet Prior to this Study: NPO Temperature Spikes Noted: No (wbc 7.1) Respiratory Status: Room air History of Recent Intubation: No Behavior/Cognition: Alert;Cooperative;Pleasant mood Oral Cavity Assessment: Dry (min) Oral Care Completed by SLP: Yes Oral Cavity - Dentition: Adequate natural dentition Vision: Functional for self-feeding Self-Feeding Abilities: Able to feed self;Needs set up (2- hands for support when holding cup) Patient Positioning: Upright in bed (assisted) Baseline Vocal Quality: Normal Volitional Cough: Strong Volitional Swallow: Able to elicit    Oral/Motor/Sensory Function Overall Oral Motor/Sensory Function: Mild impairment Facial ROM: Reduced  right;Suspected CN VII (facial) dysfunction Facial Symmetry: Abnormal symmetry right;Suspected CN VII (facial) dysfunction Facial Strength: Within Functional Limits Facial Sensation: Within Functional Limits Lingual ROM: Within Functional Limits Lingual Symmetry: Within Functional Limits (just a slight R deviation noted x1?) Lingual Strength: Within Functional Limits Lingual Sensation: Within Functional Limits Velum: Within Functional Limits Mandible: Within Functional Limits   Ice Chips Ice chips: Within functional limits Presentation: Spoon (5 trials)   Thin Liquid Thin Liquid: Impaired Presentation: Cup;Self Fed (3 trials) Oral Phase Impairments:  (wfl) Pharyngeal  Phase Impairments: Suspected delayed Swallow;Cough - Immediate (x1/3)    Nectar Thick Nectar Thick Liquid: Impaired (w/out head turn) Presentation: Cup;Self Fed (~3 ozs) Oral Phase Impairments:  (wfl) Pharyngeal Phase Impairments: Cough - Immediate (x1 prior to use of head turn strategy)   Honey Thick Honey Thick Liquid: Not tested   Puree Puree: Within functional limits Presentation: Self Fed;Spoon (10+ trials) Other Comments: use of head turn for consistency w/ po's   Solid     Solid: Within functional limits Presentation: Self Fed (8+ trials) Other Comments:  use of head turn for consistency w/ po's       Jerilynn Som, MS, CCC-SLP Speech Language Pathologist Rehab Services; [facility] [place] [phone] (ascom) Brayson Livesey 02/25/2023,5:54 PM

## 2023-02-25 NOTE — H&P (Signed)
History and Physical  Trudie Cervantes ZOX:[id number] DOB: 1940-04-28 DOA: 02/25/2023  PCP: Erasmo Downer, MD   Chief Complaint: Balance problems, right-sided weakness  HPI: [name] Quanasia Defino is a 83 y.o. female with medical history significant for HTN, T2DM, HLD, depression, OSA with CPAP, osteoarthritis, ascending thoracic aortic aneurysm, asthma and GERD who presented to the [facility] ED for evaluation of right-sided weakness and balance problems.  Patient reports that she woke up around 3 AM and ambulated to use the bathroom.  She went back to sleep and after waking up around 7:30 AM, she noticed some right-sided weakness and off balance while walking.  She felt wobbly on her feet with difficulty with coordination while walking and could tell that she was not talking right. She denies any numbness, tingling, headaches, vision changes, falls, chest pain, palpitation, dysuria or shortness of breath.  She endorses chronic cough and intermittent wheezing.  [facility]: [facility] Rhonda W ED Course: Vitals stable.  EKG shows sinus rhythm with multiple PVCs. Patient was not a candidate for TNK due to presenting outside the window.  NIHSS score of 3.  CT head did not show any intracranial abnormalities. CTA head and neck showed calcified plaque at the left carotic bifurcation and ICA above with near occlusion.  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  Patient was eval by SLP and a dysphagia diet was recommended due to patient failing her swallow screen.  Neurology was consulted for evaluation and they discussed her left ICA stenosis with neurointerventionalist at [facility], Dr. Corliss Skains, who recommended that she be transferred to [facility] hospitalist service for stroke workup and tentatively plan for diagnostic arteriogram with intent to treat likely on Wednesday.   Review of Systems: Please see HPI for pertinent positives and negatives. A complete 10 system review of systems are otherwise  negative.  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  ER/PR NEGATIVE   Cataract    Chronic cough    Complication of anesthesia    20 years ago pts b/p dropped really low, "coded"   Depression    Depression    Diabetes mellitus without complication (HCC)    diet controlled   Family history of adverse reaction to anesthesia    son PONV   GERD (gastroesophageal reflux disease)    Gout    Hypercholesteremia    Hyperlipidemia    Hypertension    Melanoma (HCC)    MVP (mitral valve prolapse)    followed by PCP   Nephritis    Nephritis    S/P appendectomy    Thoracic aortic aneurysm (HCC)    Torn meniscus    left   Past Surgical History:  Procedure Laterality Date   ABDOMINAL HYSTERECTOMY     APPENDECTOMY     BREAST BIOPSY Right 04/25/2017   retroareolar 10:00   wing clip   path pending   BREAST BIOPSY Right 04/25/2017   10:00  5CMFN  venus clip    path pending   BREAST BIOPSY Right 05/03/2017   Affirm Bx- path pending   BREAST CYST ASPIRATION Bilateral    NEG   BREAST EXCISIONAL BIOPSY Left 20+ yrs ago   NEG   BREAST RECONSTRUCTION WITH PLACEMENT OF TISSUE EXPANDER AND FLEX HD (ACELLULAR HYDRATED DERMIS) Right 06/03/2017  Procedure: BREAST RECONSTRUCTION WITH PLACEMENT OF TISSUE EXPANDER AND FLEX HD (ACELLULAR HYDRATED DERMIS);  Surgeon: Peggye Form, DO;  Location: [facility] ORS;  Service: Plastics;  Laterality: Right;   BREAST REDUCTION SURGERY Left 11/06/2017   Procedure: BREAST REDUCTION;  Surgeon: Peggye Form, DO;  Location: [facility] ORS;  Service: Plastics;  Laterality: Left;   BROW LIFT Bilateral 02/01/2015   Procedure: BLEPHAROPLASTY bilateral upper eyelids.;  Surgeon: Imagene Riches, MD;  Location: [facility];  Service: Ophthalmology;  Laterality: Bilateral;  DIABETIC - diet controlled   CATARACT  EXTRACTION W/PHACO Left 01/14/2019   Procedure: CATARACT EXTRACTION PHACO AND INTRAOCULAR LENS PLACEMENT (IOC) LEFT TECNIS TORIC ADD 5.62 00:46.5 30.0%;  Surgeon: Lockie Mola, MD;  Location: [facility];  Service: Ophthalmology;  Laterality: Left;  diabetic - diet controlled   CATARACT EXTRACTION W/PHACO Right 02/11/2019   Procedure: CATARACT EXTRACTION PHACO AND INTRAOCULAR LENS PLACEMENT (IOC) RIGHT DIABETIC 4.82 00:46.9 10.3%;  Surgeon: Lockie Mola, MD;  Location: [facility];  Service: Ophthalmology;  Laterality: Right;  Diabetic - diet controlled   CHOLECYSTECTOMY     COLONOSCOPY     COLONOSCOPY WITH PROPOFOL N/A 05/11/2015   Procedure: COLONOSCOPY WITH PROPOFOL;  Surgeon: Scot Jun, MD;  Location: [facility];  Service: Endoscopy;  Laterality: N/A;   COLONOSCOPY WITH PROPOFOL N/A 09/07/2020   Procedure: COLONOSCOPY WITH PROPOFOL;  Surgeon: Toledo, Boykin Nearing, MD;  Location: [facility] END[facility]COPY;  Service: Gastroenterology;  Laterality: N/A;   ESOPHAGOGASTRODUODENOSCOPY     ESOPHAGOGASTRODUODENOSCOPY (EGD) WITH PROPOFOL  05/11/2015   Procedure: ESOPHAGOGASTRODUODENOSCOPY (EGD) WITH PROPOFOL;  Surgeon: Scot Jun, MD;  Location: [facility] END[facility]COPY;  Service: Endoscopy;;   FRACTURE SURGERY Right    arm and shoulder   JOINT REPLACEMENT     KNEE ARTHROSCOPY Left 03/14/2016   Procedure: ARTHROSCOPY KNEE, PARTIAL MEDIAL MENISECTOMY, CHONDROPLASTY;  Surgeon: Donato Heinz, MD;  Location: [facility];  Service: Orthopedics;  Laterality: Left;   MASTECTOMY Right 2019   MASTECTOMY W/ SENTINEL NODE BIOPSY Right 06/03/2017   Procedure: MASTECTOMY WITH SENTINEL LYMPH NODE BIOPSY;  Surgeon: Earline Mayotte, MD;  Location: [facility];  Service: General;  Laterality: Right;   PARTIAL HIP ARTHROPLASTY Right    REDUCTION MAMMAPLASTY Left 2019   REMOVAL OF BILATERAL TISSUE EXPANDERS WITH PLACEMENT OF BILATERAL BREAST IMPLANTS Right 11/06/2017   Procedure: REMOVAL OF TISSUE  EXPANDER WITH PLACEMENT OF BREAST IMPLANT;  Surgeon: Peggye Form, DO;  Location: [facility];  Service: Plastics;  Laterality: Right;   TONSILLECTOMY     Social History:  reports that she quit smoking about 60 years ago. Her smoking use included cigarettes. She started smoking about 62 years ago. She has never used smokeless tobacco. She reports current alcohol use of about 7.0 - 14.0 standard drinks of alcohol per week. She reports that she does not use drugs.  Allergies  Allergen Reactions   Codeine Other (See Comments)    Chest pain   Lorazepam Other (See Comments)    "Feels loopy"   Oxycodone Other (See Comments)    MENTAL CHANGES   Paregoric     Chest pain---tolerated MORPHINE   Penicillins Hives    Has patient had a PCN reaction causing immediate rash, facial/tongue/throat swelling, SOB or lightheadedness with hypotension: Yes Has patient had a PCN reaction causing severe rash involving mucus membranes or skin necrosis: No Has patient had a PCN reaction that required hospitalization: NO Has patient had a PCN reaction occurring within the last 10 years:  No If all of the above answers are "NO", then may proceed with Cephalosporin use.  Can take Augmentin   Quinolones     PATIENT UNAWARE OF allergy to this class of antibiotics, only aware of allergy to [facility] Patient was warned about not using Cipro and similar antibiotics. Recent studies have raised concern that fluoroquinolone antibiotics could be associated with an increased risk of aortic aneurysm Fluoroquinolones have non-antimicrobial properties that might jeopardise the integrity of the extracellular matrix of the vascular wall In a  propensity score matched cohort study in [place], there was a 66% increased rate   Aleve [Naproxen Sodium] Hives, Rash and Other (See Comments)    headaches    Family History  Problem Relation Age of Onset   Cancer Mother        colon/rectal   Colon cancer Mother    Heart disease Father     Hypertension Father    Atrial fibrillation Father    Diabetes Father    Cancer Brother        lung   Healthy Son    Healthy Son    Breast cancer Neg Hx      Prior to Admission medications   Medication Sig Start Date End Date Taking? Authorizing Provider  acetaminophen (TYLENOL) 500 MG tablet Take 500 mg by mouth every 6 (six) hours as needed.    [provider]  albuterol (VENTOLIN HFA) 108 (90 Base) MCG/ACT inhaler TAKE 2 PUFFS BY MOUTH EVERY 6 HOURS AS NEEDED FOR WHEEZE OR SHORTNESS OF BREATH 05/29/21   Salena Saner, MD  allopurinol (ZYLOPRIM) 100 MG tablet TAKE 2 TABLETS BY MOUTH EVERY DAY 10/25/22   Erasmo Downer, MD  ARIPiprazole (ABILIFY) 2 MG tablet TAKE 1 TABLET BY MOUTH EVERY DAY 10/30/22   Erasmo Downer, MD  aspirin 81 MG tablet Take 81 mg by mouth daily.    [provider]  bismuth subsalicylate (PEPTO BISMOL) 262 MG chewable tablet Chew 262 mg by mouth as needed. **capsule**    [provider]  calcium carbonate (TUMS - DOSED IN MG ELEMENTAL CALCIUM) 500 MG chewable tablet Chew 1 tablet by mouth as needed for indigestion or heartburn.    [provider]  carboxymethylcellulose (REFRESH PLUS) 0.5 % SOLN Place 1 drop into both eyes 3 (three) times daily as needed (Dry Eyes).    [provider]  carvedilol (COREG) 6.25 MG tablet Take 1 tablet (6.25 mg total) by mouth 2 (two) times daily. 01/29/23   Carlos Levering, NP  Cholecalciferol (VITAMIN D-3 PO) Take 1 tablet by mouth daily.    [provider]  colestipol (COLESTID) 1 g tablet TAKE 1 TABLET BY MOUTH 2 TIMES DAILY. 10/30/22   Erasmo Downer, MD  famotidine (PEPCID) 20 MG tablet TAKE 1 TABLET BY MOUTH TWICE A DAY 10/08/22   Erasmo Downer, MD  ferrous sulfate 324 MG TBEC Take 324 mg by mouth daily with breakfast.    [provider]  fluocinonide (LIDEX) 0.05 % external solution APPLY TO AFFECTED AREAS OF SCALP DAILY UNTIL CLEAR,  THEN FOR FLARES 10/31/18   [provider]  Fluticasone-Umeclidin-Vilant (TRELEGY ELLIPTA) 100-62.5-25 MCG/ACT AEPB Inhale 1 puff into the lungs daily. 08/08/22   Salena Saner, MD  furosemide (LASIX) 20 MG tablet TAKE 1 TABLET BY MOUTH EVERY DAY 06/04/22   Antonieta Iba, MD  lisinopril (ZESTRIL) 10 MG tablet Take 1 tablet (10 mg total) by mouth daily. 05/01/22   Shirlee Latch  M, MD  Multiple Vitamins-Minerals (CENTRUM SILVER PO) Take 1 tablet by mouth daily.    [provider]  omeprazole (PRILOSEC) 40 MG capsule TAKE 1 CAPSULE BY MOUTH TWICE A DAY 10/25/22   Bacigalupo, Marzella Schlein, MD  oxybutynin (DITROPAN-XL) 10 MG 24 hr tablet TAKE 1 TABLET BY MOUTH EVERY DAY 10/25/22   Bacigalupo, Marzella Schlein, MD  potassium chloride (KLOR-CON) 10 MEQ tablet TAKE 1 TABLET BY MOUTH EVERY DAY 06/04/22   Antonieta Iba, MD  rosuvastatin (CRESTOR) 10 MG tablet TAKE 1 TABLET BY MOUTH EVERY DAY 10/30/22   Bacigalupo, Marzella Schlein, MD  tirzepatide [facility]) 2.5 MG/0.5ML Pen Inject 2.5 mg into the skin once a week. 02/11/23   Erasmo Downer, MD  tirzepatide [facility]) 5 MG/0.5ML Pen Inject 5 mg into the skin once a week. 02/11/23   Erasmo Downer, MD  Venlafaxine HCl 225 MG TB24 TAKE 1 TABLET (225 MG TOTAL) BY MOUTH DAILY. PLEASE SCHEDULE OFFICE VISIT BEFORE ANY FUTURE REFILLS 09/28/22   Erasmo Downer, MD    Physical Exam: BP (!) 164/89 (BP Location: Left Arm)   Pulse 64   Temp 97.8 F (36.6 C) (Oral)   Resp 16   SpO2 96%  General: Pleasant, acutely ill elderly woman laying in bed. No acute distress. HEENT: [place]/AT. Anicteric sclera.  EOMI. Left eye slightly dilated compared to right eye.  Both eyes reactive to light. CV: RRR. No murmurs, rubs, or gallops. No LE edema Pulmonary: Lungs CTAB. Normal effort. Mild expiratory wheezes. Decreased breath sounds at the bases. Abdominal: Soft, nontender, nondistended. Normal bowel sounds. MSK: No ttp of the right hip. Palpable radial  and DP pulses. Normal ROM. Skin: Warm and dry. No obvious rash or lesions. Neuro: A&Ox3. Mild dysarthric speech. Tongue is midline. No facial asymmetry. Abnormal FTN and HTS on the right. No drift. Strength 5/5 in LUE/LLE and 4/5 in RUE/RLE. Moves all extremities. Normal sensation to light touch. No focal deficit. Psych: Normal mood and affect          Labs on Admission:  Basic Metabolic Panel: Recent Labs  Lab 02/25/23 1113  NA 140  K 3.8  CL 105  CO2 25  GLUCOSE 135*  BUN 14  CREATININE 0.86  CALCIUM 9.3  MG 2.0   Liver Function Tests: Recent Labs  Lab 02/25/23 1113  AST 34  ALT 40  ALKPHOS 71  BILITOT 0.6  PROT 7.3  ALBUMIN 4.5   No results for input(s): "LIPASE", "AMYLASE" in the last 168 hours. No results for input(s): "AMMONIA" in the last 168 hours. CBC: Recent Labs  Lab 02/25/23 1113  WBC 7.1  NEUTROABS 5.1  HGB 12.4  HCT 38.4  MCV 90.8  PLT 301   Cardiac Enzymes: No results for input(s): "CKTOTAL", "CKMB", "CKMBINDEX", "TROPONINI" in the last 168 hours. BNP (last 3 results) No results for input(s): "BNP" in the last 8760 hours.  ProBNP (last 3 results) No results for input(s): "PROBNP" in the last 8760 hours.  CBG: No results for input(s): "GLUCAP" in the last 168 hours.  Radiological Exams on Admission: MR BRAIN WO CONTRAST Result Date: 02/25/2023 CLINICAL DATA:  Neuro deficit, acute, stroke suspected. Right-sided weakness. EXAM: MRI HEAD WITHOUT CONTRAST TECHNIQUE: Multiplanar, multiecho pulse sequences of the brain and surrounding structures were obtained without intravenous contrast. COMPARISON:  CT studies same day. FINDINGS: Brain: Diffusion imaging shows acute infarction within the left midbrain and left cerebral peduncle. No other acute infarction. Old small vessel infarction in the  central pons. No focal cerebellar insult. Cerebral hemispheres otherwise show mild chronic small-vessel ischemic change of the white matter. No large vessel  territory stroke. No mass lesion, hemorrhage, hydrocephalus or extra-axial collection. Vascular: Major vessels at the base of the brain show flow. Skull and upper cervical spine: Negative Sinuses/Orbits: Clear/normal Other: None IMPRESSION: 1. 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 2. 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 3. Mild chronic small-vessel ischemic change of the cerebral hemispheric white matter. Electronically Signed   By: Paulina Fusi M.D.   On: 02/25/2023 19:55   CT ANGIO HEAD NECK W WO CM Result Date: 02/25/2023 CLINICAL DATA:  Neuro deficit, acute, stroke suspected. Extremity weakness. Facial droop. Hypertension and diabetes. EXAM: CT ANGIOGRAPHY HEAD AND NECK WITH AND WITHOUT CONTRAST TECHNIQUE: Multidetector CT imaging of the head and neck was performed using the standard protocol during bolus administration of intravenous contrast. Multiplanar CT image reconstructions and MIPs were obtained to evaluate the vascular anatomy. Carotid stenosis measurements (when applicable) are obtained utilizing NASCET criteria, using the distal internal carotid diameter as the denominator. RADIATION DOSE REDUCTION: This exam was performed according to the departmental dose-optimization program which includes automated exposure control, adjustment of the mA and/or kV according to patient size and/or use of iterative reconstruction technique. CONTRAST:  60mL OMNIPAQUE IOHEXOL 350 MG/ML SOLN COMPARISON:  Head CT earlier same day.  Ultrasound 04/28/2021. FINDINGS: CTA NECK FINDINGS Aortic arch: Aortic atherosclerosis. Branching pattern is normal without origin stenosis. Right carotid system: Common carotid artery widely patent to the bifurcation. No soft or calcified plaque at the bifurcation. Right ICA does not show any focal stenosis but is smaller than the left. Left carotid system: Common carotid artery widely patent to the bifurcation. Calcified plaque at the carotid  bifurcation and ICA bulb. Minimal diameter in the ICA bulb less than 1 mm. Near occlusion. Vessel regains a more normal caliber at the distal bulb and is patent through the cervical region to the skull base beyond that. Vertebral arteries: Both vertebral artery origins widely patent. Both vertebral arteries patent through the cervical region to the foramen magnum. Skeleton: Chronic mid cervical spondylosis. Other neck: No mass or lymphadenopathy. Upper chest: Scarring at the lung apices.  No acute finding. Review of the MIP images confirms the above findings CTA HEAD FINDINGS Anterior circulation: Both internal carotid arteries are patent through the skull base and siphon regions. Ordinary siphon atherosclerotic calcification but no stenosis greater than 30%. The anterior and middle cerebral vessels are patent. Both anterior cerebral arteries receive there supply from the left carotid circulation. Right internal carotid artery supplies only the middle cerebral artery territory. No large vessel occlusion. No proximal stenosis. No aneurysm or vascular malformation. Posterior circulation: Both vertebral arteries patent through the foramen magnum to the basilar artery. No basilar stenosis. Posterior circulation branch vessels are patent. Left PCA receives most of it supply from the anterior circulation. Venous sinuses: Patent and normal. Anatomic variants: None significant otherwise. Review of the MIP images confirms the above findings IMPRESSION: 1. Aortic atherosclerosis. 2. Calcified plaque at the left carotid bifurcation and ICA bulb. Minimal diameter in the ICA bulb less than 1 mm. Near occlusion. Vessel regains a more normal caliber at the distal bulb and is patent through the cervical region to the skull base beyond that. This is of particular importance in this patient as the left internal carotid artery supplies the majority of the brain, as the right ICA essentially gives isolated supply  to the right middle  cerebral artery territory only. Urgent vascular surgery consultation suggested. 3. No intracranial large or medium vessel occlusion or correctable proximal stenosis. Electronically Signed   By: Paulina Fusi M.D.   On: 02/25/2023 15:13   CT Head Wo Contrast Result Date: 02/25/2023 CLINICAL DATA:  Neuro deficit, acute, stroke suspected EXAM: CT HEAD WITHOUT CONTRAST TECHNIQUE: Contiguous axial images were obtained from the base of the skull through the vertex without intravenous contrast. RADIATION DOSE REDUCTION: This exam was performed according to the departmental dose-optimization program which includes automated exposure control, adjustment of the mA and/or kV according to patient size and/or use of iterative reconstruction technique. COMPARISON:  None Available. FINDINGS: Brain: No hemorrhage. No hydrocephalus. No extra-axial fluid collection. No mass effect. No mass lesion. 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 Skull: Normal. Negative for fracture or focal lesion. Sinuses/Orbits: No middle ear or mastoid effusion. Paranasal sinuses are clear. Bilateral lens replacement. Orbits are otherwise unremarkable. Other: None. 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 Consider further evaluation with MRI. Electronically Signed   By: Lorenza Cambridge M.D.   On: 02/25/2023 11:53   Assessment/Plan Kathrynn Dalasia Predmore is a 83 y.o. female with medical history significant for HTN, T2DM, HLD, depression, OSA with CPAP, osteoarthritis, ascending thoracic aortic aneurysm, asthma and GERD who presented to the [facility] ED for evaluation of right-sided weakness and balance problems and found to have left midbrain stroke.  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  CTA head and neck shows calcified plaque in the left ICA bulb resulting in the occlusion.  Patient transferred to [facility] for further stroke workup and diagnostic arteriogram.  Markly ataxic on the right.  Patient failed swallow screen and currently on dysphagia diet. -Neurology following, appreciate recs -Neuro interventionalists following, pending diagnostic arteriogram likely Wednesday -Follow-up echocardiogram -Rectal aspirin 300 mg for now until patient passed swallow screen -Labs from 1/27 shows A1c 6.6%, LDL 98 -Permissive hypertension -PT/OT/SLP eval -Frequent neurochecks  # Right hip pain Patient endorsed right hip discomfort, described as aching. She endorses a history of osteoarthritis but denies any recent falls. No tenderness to palpation of the hip. -Follow-up right hip x-ray  # Asthma -As needed DuoNebs  # OSA on CPAP -CPAP at bedtime  # T2DM Last A1c 6.6% 2 weeks ago. -SSI with meals, CBG monitoring  # GERD # Depression # HLD # Gout -Please resume all home meds once patient passed swallow screen and meds have been verified.  DVT prophylaxis: Lovenox     Code Status: Full Code  Consults called: Neurology, neurointerventionalists  Family Communication: Discussed admission with son at bedside  Severity of Illness: The appropriate patient status for this patient is INPATIENT. Inpatient status is judged to be reasonable and necessary in order to provide the required intensity of service to ensure the patient's safety. The patient's presenting symptoms, physical exam findings, and initial radiographic and laboratory data in the context of their chronic comorbidities is felt to place them at high risk for further clinical deterioration. Furthermore, it is not anticipated that the patient will be medically stable for discharge from the hospital within 2 midnights of admission.   * I certify that at the point  of admission it is  my clinical judgment that the patient will require inpatient hospital care spanning beyond 2 midnights from the point of admission due to high intensity of service, high risk for further deterioration and high frequency of surveillance required.*  Level of care: Progressive   Steffanie Rainwater, MD 02/25/2023, 9:20 PM [facility] Pager: [phone] Isaiah 41:10   If 7PM-7AM, please contact night-coverage [email or web address] Password [email or web address]

## 2023-02-25 NOTE — ED Provider Notes (Signed)
-----------------------------------------   3:59 PM on 02/25/2023 -----------------------------------------  I took over care on this patient from Dr. Vicenta Graft.  CTA shows the below findings including a near occlusion of the left ICA.  IMPRESSION:  1. Aortic atherosclerosis.  2. Calcified plaque at the left carotid bifurcation and ICA bulb.  Minimal diameter in the ICA bulb less than 1 mm. Near occlusion.  Vessel regains a more normal caliber at the distal bulb and is  patent through the cervical region to the skull base beyond that.  This is of particular importance in this patient as the left  internal carotid artery supplies the majority of the brain, as the  right ICA essentially gives isolated supply to the right middle  cerebral artery territory only. Urgent vascular surgery consultation  suggested.  3. No intracranial large or medium vessel occlusion or correctable  proximal stenosis.   I consulted and discussed the case with Dr. Doretta Gant from neurology.  She will evaluate the patient in the ED, but recommends transfer to [facility] for a nonemergent catheter angiogram.  I updated the patient and family on the plan of care and they are in agreement with transfer.  ----------------------------------------- 8:09 PM on 02/25/2023 -----------------------------------------  I consulted and discussed the case with Dr. Lydia Sams from the hospitalist service at [facility] who has accepted the patient for transfer.  The patient is stable for transfer at this time.   Lind Repine, MD 02/25/23 [id number]

## 2023-02-25 NOTE — ED Notes (Signed)
 [facility]  on  waitlist  for  a bed

## 2023-02-25 NOTE — Consult Note (Signed)
 NEUROLOGY CONSULT NOTE   Date of service: February 25, 2023 Patient Name: Natasha Vasquez MRN:  [id number] DOB:  Jul 01, 1940 Chief Complaint: cerebellar stroke Requesting Provider: Dr. Vicenta Graft  History of Present Illness  Natasha Vasquez is a 83 y.o. female with hx of hypertension, GERD, diabetes, hyperlipidemia who presented to the [facility] ED with incoordination of her right side.  She was last known well at 3:30 AM when she ambulated to the bathroom.  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  Patient was not a TNK candidate due to presentation outside the window.  Stroke scale was 3 due to ataxia in 2 extremities as well as dysarthria.  She has mild right sided weakness but has no extremity drift on exam.  Patient failed her swallow screen and was seen by speech who recommended a dysphagia diet.  CT angiogram head and neck was performed which showed a calcified plaque in the left ICA bulb resulting in near occlusion.  Neurology consulted for further recommendations.  I discussed her left ICA stenosis with our neurointerventionalist at [facility] Dr. Alvira Josephs who recommended that she be transferred to [facility] hospitalist service for stroke workup and tentatively plan for diagnostic arteriogram with intent to treat likely on Wednesday.   LKW: 0330 Modified rankin score: 0-Completely asymptomatic and back to baseline post- stroke IV Thrombolysis: no, outside window EVT: no, no LVO  NIHSS components Score: Comment  1a Level of Conscious 0[x]  1[]  2[]  3[]      1b LOC Questions 0[x]  1[]  2[]       1c LOC Commands 0[x]  1[]  2[]       2 Best Gaze 0[x]  1[]  2[]       3 Visual 0[x]  1[]  2[]  3[]      4 Facial Palsy 0[x]  1[]  2[]  3[]      5a Motor Arm - left 0[x]  1[]  2[]  3[]  4[]  UN[]    5b Motor Arm - Right 0[x]  1[]  2[]  3[]  4[]  UN[]    6a Motor Leg - Left 0[x]  1[]  2[]  3[]  4[]   UN[]    6b Motor Leg - Right 0[x]  1[]  2[]  3[]  4[]  UN[]    7 Limb Ataxia 0[]  1[]  2[x]  3[]  UN[]     8 Sensory 0[x]  1[]  2[]  UN[]      9 Best Language 0[x]  1[]  2[]  3[]      10 Dysarthria 0[]  1[x]  2[]  UN[]      11 Extinct. 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  ER/PR NEGATIVE   Cataract    Chronic cough    Complication of anesthesia    20 years ago pts b/p dropped really low, "coded"   Depression    Depression    Diabetes mellitus without complication (HCC)    diet controlled   Family history of adverse reaction to anesthesia    son PONV   GERD (gastroesophageal reflux disease)    Gout    Hypercholesteremia    Hyperlipidemia    Hypertension    Melanoma (HCC)  MVP (mitral valve prolapse)    followed by PCP   Nephritis    Nephritis    S/P appendectomy    Thoracic aortic aneurysm (HCC)    Torn meniscus    left    Past Surgical History:  Procedure Laterality Date   ABDOMINAL HYSTERECTOMY     APPENDECTOMY     BREAST BIOPSY Right 04/25/2017   retroareolar 10:00   wing clip   path pending   BREAST BIOPSY Right 04/25/2017   10:00  5CMFN  venus clip    path pending   BREAST BIOPSY Right 05/03/2017   Affirm Bx- path pending   BREAST CYST ASPIRATION Bilateral    NEG   BREAST EXCISIONAL BIOPSY Left 20+ yrs ago   NEG   BREAST RECONSTRUCTION WITH PLACEMENT OF TISSUE EXPANDER AND FLEX HD (ACELLULAR HYDRATED DERMIS) Right 06/03/2017   Procedure: BREAST RECONSTRUCTION WITH PLACEMENT OF TISSUE EXPANDER AND FLEX HD (ACELLULAR HYDRATED DERMIS);  Surgeon: Thornell Flirt, DO;  Location: [facility];  Service: Plastics;  Laterality: Right;   BREAST REDUCTION SURGERY Left 11/06/2017    Procedure: BREAST REDUCTION;  Surgeon: Thornell Flirt, DO;  Location: [facility];  Service: Plastics;  Laterality: Left;   BROW LIFT Bilateral 02/01/2015   Procedure: BLEPHAROPLASTY bilateral upper eyelids.;  Surgeon: Zacarias Hermann, MD;  Location: [facility];  Service: Ophthalmology;  Laterality: Bilateral;  DIABETIC - diet controlled   CATARACT EXTRACTION W/PHACO Left 01/14/2019   Procedure: CATARACT EXTRACTION PHACO AND INTRAOCULAR LENS PLACEMENT (IOC) LEFT TECNIS TORIC ADD 5.62 00:46.5 30.0%;  Surgeon: Annell Kidney, MD;  Location: [facility];  Service: Ophthalmology;  Laterality: Left;  diabetic - diet controlled   CATARACT EXTRACTION W/PHACO Right 02/11/2019   Procedure: CATARACT EXTRACTION PHACO AND INTRAOCULAR LENS PLACEMENT (IOC) RIGHT DIABETIC 4.82 00:46.9 10.3%;  Surgeon: Annell Kidney, MD;  Location: [facility];  Service: Ophthalmology;  Laterality: Right;  Diabetic - diet controlled   CHOLECYSTECTOMY     COLONOSCOPY     COLONOSCOPY WITH PROPOFOL  N/A 05/11/2015   Procedure: COLONOSCOPY WITH PROPOFOL ;  Surgeon: Cassie Click, MD;  Location: [facility];  Service: Endoscopy;  Laterality: N/A;   COLONOSCOPY WITH PROPOFOL  N/A 09/07/2020   Procedure: COLONOSCOPY WITH PROPOFOL ;  Surgeon: Toledo, Alphonsus Jeans, MD;  Location: [facility] ENDOSCOPY;  Service: Gastroenterology;  Laterality: N/A;   ESOPHAGOGASTRODUODENOSCOPY     ESOPHAGOGASTRODUODENOSCOPY (EGD) WITH PROPOFOL   05/11/2015   Procedure: ESOPHAGOGASTRODUODENOSCOPY (EGD) WITH PROPOFOL ;  Surgeon: Cassie Click, MD;  Location: [facility] END[facility];  Service: Endoscopy;;   FRACTURE SURGERY Right    arm and shoulder   JOINT REPLACEMENT     KNEE ARTHROSCOPY Left 03/14/2016   Procedure: ARTHROSCOPY KNEE, PARTIAL MEDIAL MENISECTOMY, CHONDROPLASTY;  Surgeon: Arlyne Lame, MD;  Location: [facility];  Service: Orthopedics;  Laterality: Left;   MASTECTOMY Right 2019   MASTECTOMY W/ SENTINEL NODE BIOPSY Right  06/03/2017   Procedure: MASTECTOMY WITH SENTINEL LYMPH NODE BIOPSY;  Surgeon: Marshall Skeeter, MD;  Location: [facility];  Service: General;  Laterality: Right;   PARTIAL HIP ARTHROPLASTY Right    REDUCTION MAMMAPLASTY Left 2019   REMOVAL OF BILATERAL TISSUE EXPANDERS WITH PLACEMENT OF BILATERAL BREAST IMPLANTS Right 11/06/2017   Procedure: REMOVAL OF TISSUE EXPANDER WITH PLACEMENT OF BREAST IMPLANT;  Surgeon: Thornell Flirt, DO;  Location: [facility];  Service: Plastics;  Laterality: Right;   TONSILLECTOMY      Family History: Family History  Problem Relation Age of Onset   Cancer Mother  colon/rectal   Colon cancer Mother    Heart disease Father    Hypertension Father    Atrial fibrillation Father    Diabetes Father    Cancer Brother        lung   Healthy Son    Healthy Son    Breast cancer Neg Hx     Social History  reports that she quit smoking about 60 years ago. Her smoking use included cigarettes. She started smoking about 62 years ago. She has never used smokeless tobacco. She reports current alcohol use of about 7.0 - 14.0 standard drinks of alcohol per week. She reports that she does not use drugs.  Allergies  Allergen Reactions   Codeine Other (See Comments)    Chest pain   Lorazepam Other (See Comments)    "Feels loopy"   Oxycodone  Other (See Comments)    MENTAL CHANGES   Paregoric     Chest pain---tolerated MORPHINE    Penicillins Hives    Has patient had a PCN reaction causing immediate rash, facial/tongue/throat swelling, SOB or lightheadedness with hypotension: Yes Has patient had a PCN reaction causing severe rash involving mucus membranes or skin necrosis: No Has patient had a PCN reaction that required hospitalization: NO Has patient had a PCN reaction occurring within the last 10 years: No If all of the above answers are "NO", then may proceed with Cephalosporin use.  Can take Augmentin   Quinolones     PATIENT UNAWARE OF allergy to this  class of antibiotics, only aware of allergy to [facility] Patient was warned about not using Cipro  and similar antibiotics. Recent studies have raised concern that fluoroquinolone antibiotics could be associated with an increased risk of aortic aneurysm Fluoroquinolones have non-antimicrobial properties that might jeopardise the integrity of the extracellular matrix of the vascular wall In a  propensity score matched cohort study in [place], there was a 66% increased rate   Aleve [Naproxen Sodium] Hives, Rash and Other (See Comments)    headaches    Medications  No current facility-administered medications for this encounter.  Current Outpatient Medications:    acetaminophen  (TYLENOL ) 500 MG tablet, Take 500 mg by mouth every 6 (six) hours as needed., Disp: , Rfl:    albuterol  (VENTOLIN  HFA) 108 (90 Base) MCG/ACT inhaler, TAKE 2 PUFFS BY MOUTH EVERY 6 HOURS AS NEEDED FOR WHEEZE OR SHORTNESS OF BREATH, Disp: 8.5 each, Rfl: 2   allopurinol  (ZYLOPRIM ) 100 MG tablet, TAKE 2 TABLETS BY MOUTH EVERY DAY, Disp: 180 tablet, Rfl: 1   ARIPiprazole  (ABILIFY ) 2 MG tablet, TAKE 1 TABLET BY MOUTH EVERY DAY, Disp: 90 tablet, Rfl: 1   aspirin  81 MG tablet, Take 81 mg by mouth daily., Disp: , Rfl:    bismuth  subsalicylate (PEPTO BISMOL) 262 MG chewable tablet, Chew 262 mg by mouth as needed. **capsule**, Disp: , Rfl:    calcium  carbonate (TUMS - DOSED IN MG ELEMENTAL CALCIUM ) 500 MG chewable tablet, Chew 1 tablet by mouth as needed for indigestion or heartburn., Disp: , Rfl:    carboxymethylcellulose (REFRESH PLUS) 0.5 % SOLN, Place 1 drop into both eyes 3 (three) times daily as needed (Dry Eyes)., Disp: , Rfl:    carvedilol  (COREG ) 6.25 MG tablet, Take 1 tablet (6.25 mg total) by mouth 2 (two) times daily., Disp: 180 tablet, Rfl: 3   Cholecalciferol (VITAMIN D -3 PO), Take 1 tablet by mouth daily., Disp: , Rfl:    colestipol  (COLESTID ) 1 g tablet, TAKE 1 TABLET BY MOUTH 2 TIMES DAILY.,  Disp: 180 tablet, Rfl: 5    famotidine  (PEPCID ) 20 MG tablet, TAKE 1 TABLET BY MOUTH TWICE A DAY, Disp: 180 tablet, Rfl: 1   ferrous sulfate  324 MG TBEC, Take 324 mg by mouth daily with breakfast., Disp: , Rfl:    fluocinonide (LIDEX) 0.05 % external solution, APPLY TO AFFECTED AREAS OF SCALP DAILY UNTIL CLEAR, THEN FOR FLARES, Disp: , Rfl:    Fluticasone -Umeclidin-Vilant (TRELEGY ELLIPTA ) 100-62.5-25 MCG/ACT AEPB, Inhale 1 puff into the lungs daily., Disp: 60 each, Rfl: 11   furosemide  (LASIX ) 20 MG tablet, TAKE 1 TABLET BY MOUTH EVERY DAY, Disp: 90 tablet, Rfl: 3   lisinopril  (ZESTRIL ) 10 MG tablet, Take 1 tablet (10 mg total) by mouth daily., Disp: 90 tablet, Rfl: 1   Multiple Vitamins-Minerals (CENTRUM SILVER PO), Take 1 tablet by mouth daily., Disp: , Rfl:    omeprazole  (PRILOSEC) 40 MG capsule, TAKE 1 CAPSULE BY MOUTH TWICE A DAY, Disp: 180 capsule, Rfl: 0   oxybutynin  (DITROPAN -XL) 10 MG 24 hr tablet, TAKE 1 TABLET BY MOUTH EVERY DAY, Disp: 90 tablet, Rfl: 1   potassium chloride  (KLOR-CON ) 10 MEQ tablet, TAKE 1 TABLET BY MOUTH EVERY DAY, Disp: 90 tablet, Rfl: 3   rosuvastatin  (CRESTOR ) 10 MG tablet, TAKE 1 TABLET BY MOUTH EVERY DAY, Disp: 90 tablet, Rfl: 1   tirzepatide (MOUNJARO) 2.5 MG/0.5ML Pen, Inject 2.5 mg into the skin once a week., Disp: 2 mL, Rfl: 0   tirzepatide (MOUNJARO) 5 MG/0.5ML Pen, Inject 5 mg into the skin once a week., Disp: 6 mL, Rfl: 1   Venlafaxine  HCl 225 MG TB24, TAKE 1 TABLET (225 MG TOTAL) BY MOUTH DAILY. PLEASE SCHEDULE OFFICE VISIT BEFORE ANY FUTURE REFILLS, Disp: 90 tablet, Rfl: 1  Vitals   Vitals:   02/25/23 1441 02/25/23 1952 02/25/23 2002 02/25/23 2009  BP: (!) 150/60 (!) 171/98 (!) 171/98 (!) 171/98  Pulse: 82 73 76 76  Resp: 18 16 16 16   Temp:  98.2 F (36.8 C) 98.2 F (36.8 C) 98.2 F (36.8 C)  TempSrc:  Oral    SpO2: 95% 96% 96% 96%    There is no height or weight on file to calculate BMI.  Physical Exam   Constitutional: Appears well-developed and well-nourished.   Psych: Affect appropriate to situation.  Eyes: No scleral injection.  HENT: No OP obstruction.  Head: Normocephalic.  Cardiovascular: Normal rate and regular rhythm.  Respiratory: Effort normal, non-labored breathing.  GI: Soft.  No distension. There is no tenderness.  Skin: WDI.   Neurologic Examination    Physical Exam Gen: A&Ox4, NAD HEENT: Atraumatic, normocephalic; oropharynx clear, tongue without atrophy or fasciculations. Resp: CTAB, normal work of breathing CV: RRR, extremities appear well-perfused. Abd: soft/NT/ND Extrem: Nml bulk; no cyanosis, clubbing, or edema.  Neuro: *MS: A&O x4. Follows multi-step commands.  *Speech: mild dysarthria, no aphasia, able to name and repeat. *CN:    I: Deferred   II,III: PERRLA, VFF by confrontation, optic discs not visualized 2/2 pupillary constriction   III,IV,VI: EOMI w/o nystagmus, no ptosis   V: Sensation intact from V1 to V3 to LT   VII: Eyelid closure was full.  Smile symmetric.   VIII: Hearing intact to voice   IX,X: Voice normal, palate elevates symmetrically    XI: SCM/trap 5/5 bilat   XII: Tongue protrudes midline, no atrophy or fasciculations  *Motor:   Normal bulk.  No tremor, rigidity or bradykinesia. No pronator drift.   Strength: Dlt Bic Tri WE WrF FgS Gr HF  KnF KnE PlF DoF    Left 5 5 5 5 5 5 5 5 5 5 5 5     Right 4+ 4+ 4 4+ 4+ 4+ 4+ 3* 5 5 5 5    R HF motor limited 2/2 cramping pain in R hip  *Sensory: Intact to light touch, pinprick, temperature vibration throughout. Symmetric. Propioception intact bilat.  No double-simultaneous extinction.  *Coordination:  Marked ataxia on R FNF and R HTS *Reflexes:  1+ and symmetric throughout without clonus; toes down-going bilat *Gait: deferred  Labs/Imaging/Neurodiagnostic studies   CBC:  Recent Labs  Lab 02/28/2023 1113  WBC 7.1  NEUTROABS 5.1  HGB 12.4  HCT 38.4  MCV 90.8  PLT 301   Basic Metabolic Panel:  Lab Results  Component Value Date   NA 140  Feb 28, 2023   K 3.8 Feb 28, 2023   CO2 25 2023/02/28   GLUCOSE 135 (H) 2023/02/28   BUN 14 02-28-23   CREATININE 0.86 2023-02-28   CALCIUM  9.3 February 28, 2023   GFRNONAA >60 Feb 28, 2023   GFRAA 55 (L) 08/05/2019   Lipid Panel:  Lab Results  Component Value Date   LDLCALC 98 02/11/2023   HgbA1c:  Lab Results  Component Value Date   HGBA1C 6.6 (H) 02/11/2023   Urine Drug Screen: No results found for: "LABOPIA", "COCAINSCRNUR", "LABBENZ", "AMPHETMU", "THCU", "LABBARB"  Alcohol Level No results found for: "ETH" INR  Lab Results  Component Value Date   INR 1.0 02-28-2023   APTT  Lab Results  Component Value Date   APTT 28 2023/02/28   AED levels: No results found for: "PHENYTOIN", "ZONISAMIDE", "LAMOTRIGINE", "LEVETIRACETA"  CT Head without contrast(Personally reviewed): Age indeterminate left superior cerebellar infarct. Consider further evaluation with MRI.  CT angio Head and Neck with contrast(Personally reviewed): 1. Aortic atherosclerosis. 2. Calcified plaque at the left carotid bifurcation and ICA bulb. Minimal diameter in the ICA bulb less than 1 mm. Near occlusion. Vessel regains a more normal caliber at the distal bulb and is patent through the cervical region to the skull base beyond that. This is of particular importance in this patient as the left internal carotid artery supplies the majority of the brain, as the right ICA essentially gives isolated supply to the right middle cerebral artery territory only. Urgent vascular surgery consultation suggested. 3. No intracranial large or medium vessel occlusion or correctable proximal stenosis.  ASSESSMENT   Pritika Neleah Purdue is a 83 y.o. female with hx of hypertension, GERD, diabetes, hyperlipidemia who presented to the [facility] ED with acute onset R sided ataxia, dysarthria, and dysphagia (LKW 0330). 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  Patient was not a TNK candidate  due to presentation outside the window.    CT angiogram head and neck was performed which showed a calcified plaque in the left ICA bulb resulting in near occlusion.  I discussed her left ICA stenosis with our neurointerventionalist at [facility] Dr. Alvira Josephs who recommended that she be transferred to [facility] hospitalist service for stroke workup and tentatively plan for diagnostic arteriogram with intent to treat likely on Wednesday.  RECOMMENDATIONS   - Transfer to [facility] for stroke workup and likely diagnostic arteriogram Wed. Stroke neurologist to notify Dr. Alvira Josephs when patient arrives so that IR team can formally consult tomorrow. - Permissive HTN x48 hrs from sx onset or until stroke ruled out by MRI goal BP <220/120. PRN labetalol or hydralazine  if BP above these parameters. Avoid oral antihypertensives. - MRI brain wo contrast - TTE -  Check A1c and LDL + add statin per guidelines - Ideally patient would be started on DAPT but she has been placed on dysphagia diet by speech and is unable to take plavix . Recommend ASA 300mg  PR daily for now. - q4 hr neuro checks - STAT head CT for any change in neuro exam - Tele - PT/OT/SLP - Stroke education - Amb referral to neurology upon discharge    Please notify neurology upon patient arrival to [facility] ______________________________________________________________________    Signed, Eleni Griffin, MD [facility]

## 2023-02-26 ENCOUNTER — Encounter (HOSPITAL_COMMUNITY): Payer: Self-pay | Admitting: Student

## 2023-02-26 ENCOUNTER — Inpatient Hospital Stay (HOSPITAL_COMMUNITY): Payer: Medicare PPO

## 2023-02-26 DIAGNOSIS — I6389 Other cerebral infarction: Secondary | ICD-10-CM

## 2023-02-26 DIAGNOSIS — I639 Cerebral infarction, unspecified: Secondary | ICD-10-CM | POA: Diagnosis not present

## 2023-02-26 DIAGNOSIS — I6522 Occlusion and stenosis of left carotid artery: Secondary | ICD-10-CM

## 2023-02-26 LAB — ECHOCARDIOGRAM COMPLETE
AR max vel: 2.62 cm2
AV Area VTI: 2.64 cm2
AV Area mean vel: 2.65 cm2
AV Mean grad: 6 mm[Hg]
AV Peak grad: 10.9 mm[Hg]
Ao pk vel: 1.65 m/s
Area-P 1/2: 2.93 cm2
Calc EF: 53.3 %
S' Lateral: 2.7 cm
Single Plane A2C EF: 63.7 %
Single Plane A4C EF: 43.4 %

## 2023-02-26 LAB — GLUCOSE, CAPILLARY
Glucose-Capillary: 110 mg/dL — ABNORMAL HIGH (ref 70–99)
Glucose-Capillary: 117 mg/dL — ABNORMAL HIGH (ref 70–99)
Glucose-Capillary: 118 mg/dL — ABNORMAL HIGH (ref 70–99)
Glucose-Capillary: 114 mg/dL — ABNORMAL HIGH (ref 70–99)
Glucose-Capillary: 125 mg/dL — ABNORMAL HIGH (ref 70–99)

## 2023-02-26 LAB — CBC
HCT: 37.8 % (ref 36.0–46.0)
Hemoglobin: 12.5 g/dL (ref 12.0–15.0)
MCH: 29.3 pg (ref 26.0–34.0)
MCHC: 33.1 g/dL (ref 30.0–36.0)
MCV: 88.5 fL (ref 80.0–100.0)
Platelets: 288 10*3/uL (ref 150–400)
RBC: 4.27 MIL/uL (ref 3.87–5.11)
RDW: 15.3 % (ref 11.5–15.5)
WBC: 8.9 10*3/uL (ref 4.0–10.5)
nRBC: 0 % (ref 0.0–0.2)

## 2023-02-26 LAB — BASIC METABOLIC PANEL
Anion gap: 13 (ref 5–15)
BUN: 10 mg/dL (ref 8–23)
CO2: 24 mmol/L (ref 22–32)
Calcium: 9.6 mg/dL (ref 8.9–10.3)
Chloride: 104 mmol/L (ref 98–111)
Creatinine, Ser: 0.91 mg/dL (ref 0.44–1.00)
GFR, Estimated: >60 mL/min (ref >60)
Glucose, Bld: 107 mg/dL — ABNORMAL HIGH (ref 70–99)
Potassium: 3.6 mmol/L (ref 3.5–5.1)
Sodium: 141 mmol/L (ref 135–145)

## 2023-02-26 LAB — LIPID PANEL
Cholesterol: 160 mg/dL (ref 0–200)
HDL: 29 mg/dL — ABNORMAL LOW (ref >40)
LDL Cholesterol: UNDETERMINED mg/dL (ref 0–99)
Total CHOL/HDL Ratio: 5.5 {ratio}
Triglycerides: 442 mg/dL — ABNORMAL HIGH (ref <150)
VLDL: UNDETERMINED mg/dL (ref 0–40)

## 2023-02-26 LAB — MICROALBUMIN / CREATININE URINE RATIO
Creatinine, Urine: 93.9 mg/dL
Microalb Creat Ratio: 5 mg/g{creat} (ref 0–29)
Microalb, Ur: 4.8 ug/mL — ABNORMAL HIGH

## 2023-02-26 LAB — LDL CHOLESTEROL, DIRECT: Direct LDL: 81 mg/dL (ref 0–99)

## 2023-02-26 MED ORDER — VENLAFAXINE HCL ER 75 MG PO CP24
225.0000 mg | ORAL_CAPSULE | Freq: Every day | ORAL | Status: DC
  Administered 2023-02-26 – 2023-03-01 (×4): 225 mg via ORAL
  Filled 2023-02-26 (×4): qty 3

## 2023-02-26 MED ORDER — CLOPIDOGREL BISULFATE 75 MG PO TABS
75.0000 mg | ORAL_TABLET | Freq: Every day | ORAL | Status: DC
  Administered 2023-02-27 – 2023-03-01 (×3): 75 mg via ORAL
  Filled 2023-02-26 (×3): qty 1

## 2023-02-26 MED ORDER — CARVEDILOL 6.25 MG PO TABS
6.2500 mg | ORAL_TABLET | Freq: Two times a day (BID) | ORAL | Status: DC
  Administered 2023-02-26 (×2): 6.25 mg via ORAL
  Filled 2023-02-26 (×2): qty 1

## 2023-02-26 MED ORDER — ROSUVASTATIN CALCIUM 20 MG PO TABS
20.0000 mg | ORAL_TABLET | Freq: Every day | ORAL | Status: DC
  Administered 2023-02-26 – 2023-03-01 (×4): 20 mg via ORAL
  Filled 2023-02-26 (×4): qty 1

## 2023-02-26 MED ORDER — ARIPIPRAZOLE 2 MG PO TABS
2.0000 mg | ORAL_TABLET | Freq: Every day | ORAL | Status: DC
  Administered 2023-02-26 – 2023-03-01 (×4): 2 mg via ORAL
  Filled 2023-02-26 (×4): qty 1

## 2023-02-26 MED ORDER — PANTOPRAZOLE SODIUM 40 MG PO TBEC
40.0000 mg | DELAYED_RELEASE_TABLET | Freq: Every day | ORAL | Status: DC
  Administered 2023-02-26 – 2023-03-01 (×4): 40 mg via ORAL
  Filled 2023-02-26 (×4): qty 1

## 2023-02-26 MED ORDER — PERFLUTREN LIPID MICROSPHERE
1.0000 mL | INTRAVENOUS | Status: AC | PRN
  Administered 2023-02-26: 3 mL via INTRAVENOUS

## 2023-02-26 MED ORDER — ALLOPURINOL 100 MG PO TABS
200.0000 mg | ORAL_TABLET | Freq: Every day | ORAL | Status: DC
  Administered 2023-02-26 – 2023-03-01 (×4): 200 mg via ORAL
  Filled 2023-02-26 (×4): qty 2

## 2023-02-26 MED ORDER — ASPIRIN 300 MG RE SUPP
300.0000 mg | Freq: Once | RECTAL | Status: AC
  Administered 2023-02-26: 300 mg via RECTAL
  Filled 2023-02-26: qty 1

## 2023-02-26 MED ORDER — FLUTICASONE FUROATE-VILANTEROL 100-25 MCG/ACT IN AEPB
1.0000 | INHALATION_SPRAY | Freq: Every day | RESPIRATORY_TRACT | Status: DC
  Administered 2023-02-28 – 2023-03-01 (×2): 1 via RESPIRATORY_TRACT
  Filled 2023-02-26: qty 28

## 2023-02-26 MED ORDER — UMECLIDINIUM BROMIDE 62.5 MCG/ACT IN AEPB
1.0000 | INHALATION_SPRAY | Freq: Every day | RESPIRATORY_TRACT | Status: DC
  Administered 2023-02-28 – 2023-03-01 (×2): 1 via RESPIRATORY_TRACT
  Filled 2023-02-26: qty 7

## 2023-02-26 MED ORDER — ASPIRIN 81 MG PO CHEW
81.0000 mg | CHEWABLE_TABLET | Freq: Every day | ORAL | Status: DC
Start: 2023-02-27 — End: 2023-03-01
  Administered 2023-02-27 – 2023-03-01 (×3): 81 mg via ORAL
  Filled 2023-02-26 (×3): qty 1

## 2023-02-26 MED ORDER — PHENOL 1.4 % MT LIQD
1.0000 | OROMUCOSAL | Status: DC | PRN
  Administered 2023-02-26: 1 via OROMUCOSAL
  Filled 2023-02-26 (×2): qty 177

## 2023-02-26 NOTE — Progress Notes (Signed)
Inpatient Rehab Admissions Coordinator:   Per therapy recommendations pt was screened for CIR candidacy by Estill Dooms, PT, DPT.  At this time she appears to be a potential candidate and I will place an order for consult per our protocol. Note pt already mobilizing at [facility] level and may quickly progress past a level necessitating a CIR stay.   Estill Dooms, PT, DPT Admissions Coordinator [phone] 02/26/23  1:02 PM

## 2023-02-26 NOTE — Telephone Encounter (Signed)
Requested Prescriptions  Pending Prescriptions Disp Refills   Venlafaxine HCl 225 MG TB24 [Pharmacy Med Name: VENLAFAXINE HCL ER 225 MG TAB] 90 tablet 0    Sig: TAKE 1 TABLET (225 MG TOTAL) BY MOUTH DAILY. PLEASE SCHEDULE OFFICE VISIT BEFORE ANY FUTURE REFILLS     Psychiatry: Antidepressants - SNRI - desvenlafaxine & venlafaxine Failed - 02/26/2023  8:31 AM      Failed - Last BP in normal range    BP Readings from Last 1 Encounters:  02/26/23 (!) 174/94         Failed - Lipid Panel in normal range within the last 12 months    Cholesterol, Total  Date Value Ref Range Status  02/11/2023 198 100 - 199 mg/dL Final   Cholesterol  Date Value Ref Range Status  02/26/2023 160 0 - 200 mg/dL Final   LDL Chol Calc (NIH)  Date Value Ref Range Status  02/11/2023 98 0 - 99 mg/dL Final   LDL Cholesterol  Date Value Ref Range Status  02/26/2023 UNABLE TO CALCULATE IF TRIGLYCERIDE OVER 400 mg/dL 0 - 99 mg/dL Final    Comment:           Total Cholesterol/HDL:CHD Risk Coronary Heart Disease Risk Table                     Men   Women  1/2 Average Risk   3.4   3.3  Average Risk       5.0   4.4  2 X Average Risk   9.6   7.1  3 X Average Risk  23.4   11.0        Use the calculated Patient Ratio above and the CHD Risk Table to determine the patient's CHD Risk.        ATP III CLASSIFICATION (LDL):  <100     mg/dL   Optimal  308-657  mg/dL   Near or Above                    Optimal  130-159  mg/dL   Borderline  846-962  mg/dL   High  >952     mg/dL   Very High Performed at [facility] Lab, [address], [place], [place] [address]    Direct LDL  Date Value Ref Range Status  02/26/2023 81 0 - 99 mg/dL Final    Comment:    Performed at [facility] Lab, [address] [place], [place] [address]   HDL  Date Value Ref Range Status  02/26/2023 29 (L) >40 mg/dL Final  27/25/3664 39 (L) >39 mg/dL Final   Triglycerides  Date Value Ref Range Status  02/26/2023 442 (H) <150 mg/dL  Final         Passed - Cr in normal range and within 360 days    Creatinine, Ser  Date Value Ref Range Status  02/26/2023 0.91 0.44 - 1.00 mg/dL Final         Passed - Completed PHQ-2 or PHQ-9 in the last 360 days      Passed - Valid encounter within last 6 months    Recent Outpatient Visits           2 weeks ago Hypertension associated with diabetes [facility])   [place] [facility] [place], Marzella Schlein, MD   9 months ago Hypertension associated with diabetes [facility])   [place] [facility] Alfredia Ferguson, PA-C   10 months  ago Hypertension associated with diabetes [facility])   [place] [facility] [place], Marzella Schlein, MD   1 year ago Hypertension associated with diabetes [facility])   [place] [facility] [place], Darl Householder, DO   1 year ago Community acquired pneumonia of left lung, unspecified part of lung   [place] [facility], Marzella Schlein, MD       Future Appointments             In 2 weeks Salena Saner, MD [place] [facility] [place]   In 2 months [place], Gavin Pound, NP [place] [facility] [place]   In 2 months Bacigalupo, Marzella Schlein, MD [facility], [facility]             famotidine (PEPCID) 20 MG tablet [Pharmacy Med Name: FAMOTIDINE 20 MG TABLET] 180 tablet 0    Sig: TAKE 1 TABLET BY MOUTH TWICE A DAY     Gastroenterology:  H2 Antagonists Passed - 02/26/2023  8:31 AM      Passed - Valid encounter within last 12 months    Recent Outpatient Visits           2 weeks ago Hypertension associated with diabetes [facility])   [place] [facility] [place], Marzella Schlein, MD   9 months ago Hypertension associated with diabetes [facility])   [place] [facility] Alfredia Ferguson, PA-C   10 months ago Hypertension associated with diabetes [facility])   [place] [facility] [place], Marzella Schlein, MD   1  year ago Hypertension associated with diabetes [facility])   [place] [facility] [place], Darl Householder, DO   1 year ago Community acquired pneumonia of left lung, unspecified part of lung   [place] [facility], Marzella Schlein, MD       Future Appointments             In 2 weeks Salena Saner, MD  [place] [facility] [place]   In 2 months Wittenborn, Gavin Pound, NP  [facility] [place]   In 2 months Bacigalupo, Marzella Schlein, MD [facility], [facility]             ARIPiprazole (ABILIFY) 2 MG tablet [Pharmacy Med Name: ARIPIPRAZOLE 2 MG TABLET] 90 tablet 1    Sig: TAKE 1 TABLET BY MOUTH EVERY DAY     Not Delegated - Psychiatry:  Antipsychotics - Second Generation (Atypical) - aripiprazole Failed - 02/26/2023  8:31 AM      Failed - This refill cannot be delegated      Failed - Last BP in normal range    BP Readings from Last 1 Encounters:  02/26/23 (!) 174/94         Failed - Lipid Panel in normal range within the last 12 months    Cholesterol, Total  Date Value Ref Range Status  02/11/2023 198 100 - 199 mg/dL Final   Cholesterol  Date Value Ref Range Status  02/26/2023 160 0 - 200 mg/dL Final   LDL Chol Calc (NIH)  Date Value Ref Range Status  02/11/2023 98 0 - 99 mg/dL Final   LDL Cholesterol  Date Value Ref Range Status  02/26/2023 UNABLE TO CALCULATE IF TRIGLYCERIDE OVER 400 mg/dL 0 - 99 mg/dL Final    Comment:           Total Cholesterol/HDL:CHD Risk Coronary Heart Disease Risk Table  Men   Women  1/2 Average Risk   3.4   3.3  Average Risk       5.0   4.4  2 X Average Risk   9.6   7.1  3 X Average Risk  23.4   11.0        Use the calculated Patient Ratio above and the CHD Risk Table to determine the patient's CHD Risk.        ATP III CLASSIFICATION (LDL):  <100     mg/dL   Optimal  782-956  mg/dL   Near or Above                    Optimal  130-159   mg/dL   Borderline  213-086  mg/dL   High  >578     mg/dL   Very High Performed at [facility], [address] [place], [place] [address]    Direct LDL  Date Value Ref Range Status  02/26/2023 81 0 - 99 mg/dL Final    Comment:    Performed at [facility], [address] [place], [place] [address]   HDL  Date Value Ref Range Status  02/26/2023 29 (L) >40 mg/dL Final  13/24/4010 39 (L) >39 mg/dL Final   Triglycerides  Date Value Ref Range Status  02/26/2023 442 (H) <150 mg/dL Final         Failed - CMP within normal limits and completed in the last 12 months    Albumin  Date Value Ref Range Status  02/25/2023 4.5 3.5 - 5.0 g/dL Final  27/25/3664 4.7 3.7 - 4.7 g/dL Final   Alkaline Phosphatase  Date Value Ref Range Status  02/25/2023 71 38 - 126 U/L Final   ALT  Date Value Ref Range Status  02/25/2023 40 0 - 44 U/L Final   AST  Date Value Ref Range Status  02/25/2023 34 15 - 41 U/L Final   BUN  Date Value Ref Range Status  02/26/2023 10 8 - 23 mg/dL Final  40/34/7425 18 8 - 27 mg/dL Final   Calcium  Date Value Ref Range Status  02/26/2023 9.6 8.9 - 10.3 mg/dL Final   CO2  Date Value Ref Range Status  02/26/2023 24 22 - 32 mmol/L Final   Creatinine, Ser  Date Value Ref Range Status  02/26/2023 0.91 0.44 - 1.00 mg/dL Final   Glucose, Bld  Date Value Ref Range Status  02/26/2023 107 (H) 70 - 99 mg/dL Final    Comment:    Glucose reference range applies only to samples taken after fasting for at least 8 hours.   Glucose-Capillary  Date Value Ref Range Status  02/26/2023 117 (H) 70 - 99 mg/dL Final    Comment:    Glucose reference range applies only to samples taken after fasting for at least 8 hours.   Potassium  Date Value Ref Range Status  02/26/2023 3.6 3.5 - 5.1 mmol/L Final   Sodium  Date Value Ref Range Status  02/26/2023 141 135 - 145 mmol/L Final  02/11/2023 139 134 - 144 mmol/L Final   Total Bilirubin  Date Value Ref  Range Status  02/25/2023 0.6 0.0 - 1.2 mg/dL Final   Bilirubin Total  Date Value Ref Range Status  02/11/2023 0.4 0.0 - 1.2 mg/dL Final   Protein, ur  Date Value Ref Range Status  02/25/2023 NEGATIVE NEGATIVE mg/dL Final   Total Protein  Date Value Ref Range  Status  02/25/2023 7.3 6.5 - 8.1 g/dL Final  16/10/9602 7.0 6.0 - 8.5 g/dL Final   GFR calc Af Amer  Date Value Ref Range Status  08/05/2019 55 (L) >59 mL/min/1.73 Final    Comment:    **[facility] currently reports eGFR in compliance with the current**   recommendations of the [employer]. [facility] will   update reporting as new guidelines are published from the NKF-ASN   Task force.    eGFR  Date Value Ref Range Status  02/11/2023 44 (L) >59 mL/min/1.73 Final   GFR, Estimated  Date Value Ref Range Status  02/26/2023 >60 >60 mL/min Final    Comment:    (NOTE) Calculated using the CKD-EPI Creatinine Equation (2021)          Passed - TSH in normal range and within 360 days    TSH  Date Value Ref Range Status  02/25/2023 3.325 0.350 - 4.500 uIU/mL Final    Comment:    Performed by a 3rd Generation assay with a functional sensitivity of <=0.01 uIU/mL. Performed at [facility], [address] [place], [place] [address]   02/11/2023 3.750 0.450 - 4.500 uIU/mL Final         Passed - Completed PHQ-2 or PHQ-9 in the last 360 days      Passed - Last Heart Rate in normal range    Pulse Readings from Last 1 Encounters:  02/26/23 76         Passed - Valid encounter within last 6 months    Recent Outpatient Visits           2 weeks ago Hypertension associated with diabetes [facility])   [place] [facility] [place], Marzella Schlein, MD   9 months ago Hypertension associated with diabetes [facility])   [place] [facility] Alfredia Ferguson, PA-C   10 months ago Hypertension associated with diabetes [facility])   [place] [facility] [place], Marzella Schlein, MD    1 year ago Hypertension associated with diabetes [facility])   [place] [facility] [place], Darl Householder, DO   1 year ago Community acquired pneumonia of left lung, unspecified part of lung   [place] [facility] [place], Marzella Schlein, MD       Future Appointments             In 2 weeks Salena Saner, MD [place] [facility] [place]   In 2 months W[place], Gavin Pound, NP  [facility] at [place]   In 2 months Bacigalupo, Marzella Schlein, MD [facility], [facility]            Passed - CBC within normal limits and completed in the last 12 months    WBC  Date Value Ref Range Status  02/26/2023 8.9 4.0 - 10.5 K/uL Final   RBC  Date Value Ref Range Status  02/26/2023 4.27 3.87 - 5.11 MIL/uL Final   Hemoglobin  Date Value Ref Range Status  02/26/2023 12.5 12.0 - 15.0 g/dL Final  11/91/4782 95.6 (L) 11.1 - 15.9 g/dL Final   HCT  Date Value Ref Range Status  02/26/2023 37.8 36.0 - 46.0 % Final   Hematocrit  Date Value Ref Range Status  09/17/2018 33.0 (L) 34.0 - 46.6 % Final   MCHC  Date Value Ref Range Status  02/26/2023 33.1 30.0 - 36.0 g/dL Final   [facility]  Date Value Ref Range Status  02/26/2023 29.3 26.0 -  34.0 pg Final   MCV  Date Value Ref Range Status  02/26/2023 88.5 80.0 - 100.0 fL Final  09/17/2018 78 (L) 79 - 97 fL Final   No results found for: "PLTCOUNTKUC", "LABPLAT", "POCPLA" RDW  Date Value Ref Range Status  02/26/2023 15.3 11.5 - 15.5 % Final  09/17/2018 16.5 (H) 11.7 - 15.4 % Final          rosuvastatin (CRESTOR) 10 MG tablet [Pharmacy Med Name: ROSUVASTATIN CALCIUM 10 MG TAB] 90 tablet 0    Sig: TAKE 1 TABLET BY MOUTH EVERY DAY     Cardiovascular:  Antilipid - Statins 2 Failed - 02/26/2023  8:31 AM      Failed - Lipid Panel in normal range within the last 12 months    Cholesterol, Total  Date Value Ref Range Status  02/11/2023 198 100 - 199 mg/dL Final    Cholesterol  Date Value Ref Range Status  02/26/2023 160 0 - 200 mg/dL Final   LDL Chol Calc (NIH)  Date Value Ref Range Status  02/11/2023 98 0 - 99 mg/dL Final   LDL Cholesterol  Date Value Ref Range Status  02/26/2023 UNABLE TO CALCULATE IF TRIGLYCERIDE OVER 400 mg/dL 0 - 99 mg/dL Final    Comment:           Total Cholesterol/HDL:CHD Risk Coronary Heart Disease Risk Table                     Men   Women  1/2 Average Risk   3.4   3.3  Average Risk       5.0   4.4  2 X Average Risk   9.6   7.1  3 X Average Risk  23.4   11.0        Use the calculated Patient Ratio above and the CHD Risk Table to determine the patient's CHD Risk.        ATP III CLASSIFICATION (LDL):  <100     mg/dL   Optimal  161-096  mg/dL   Near or Above                    Optimal  130-159  mg/dL   Borderline  045-409  mg/dL   High  >811     mg/dL   Very High Performed at [facility], [address] [place], [place] [address]    Direct LDL  Date Value Ref Range Status  02/26/2023 81 0 - 99 mg/dL Final    Comment:    Performed at [facility], [address] [place], [place] [address]   HDL  Date Value Ref Range Status  02/26/2023 29 (L) >40 mg/dL Final  13/08/6576 39 (L) >39 mg/dL Final   Triglycerides  Date Value Ref Range Status  02/26/2023 442 (H) <150 mg/dL Final         Passed - Cr in normal range and within 360 days    Creatinine, Ser  Date Value Ref Range Status  02/26/2023 0.91 0.44 - 1.00 mg/dL Final         Passed - Patient is not pregnant      Passed - Valid encounter within last 12 months    Recent Outpatient Visits           2 weeks ago Hypertension associated with diabetes [facility])   [place] [facility] [place], Marzella Schlein, MD   9 months ago Hypertension associated with  diabetes [facility])   [place] [facility] [place], [place], PA-C   10 months ago Hypertension associated with diabetes [facility])   [place] [facility] [place], Marzella Schlein, MD   1 year ago Hypertension associated with diabetes [facility])   [place] [facility] Caro [place], DO   1 year ago Community acquired pneumonia of left lung, unspecified part of lung   [place] [facility] [place], Marzella Schlein, MD       Future Appointments             In 2 weeks Salena Saner, MD [place] [facility] [place]   In 2 months Carlos Levering, NP [facility] [place]   In 2 months Bacigalupo, Marzella Schlein, MD [facility], [facility]            Refused Prescriptions Disp Refills   lisinopril (ZESTRIL) 5 MG tablet [Pharmacy Med Name: LISINOPRIL 5 MG TABLET] 90 tablet 1    Sig: TAKE 1 TABLET (5 MG TOTAL) BY MOUTH DAILY.     Cardiovascular:  ACE Inhibitors Failed - 02/26/2023  8:31 AM      Failed - Last BP in normal range    BP Readings from Last 1 Encounters:  02/26/23 (!) 174/94         Passed - Cr in normal range and within 180 days    Creatinine, Ser  Date Value Ref Range Status  02/26/2023 0.91 0.44 - 1.00 mg/dL Final         Passed - K in normal range and within 180 days    Potassium  Date Value Ref Range Status  02/26/2023 3.6 3.5 - 5.1 mmol/L Final         Passed - Patient is not pregnant      Passed - Valid encounter within last 6 months    Recent Outpatient Visits           2 weeks ago Hypertension associated with diabetes [facility])   [place] [facility] [place], Marzella Schlein, MD   9 months ago Hypertension associated with diabetes [facility])   [place] [facility] Alfredia Ferguson, PA-C   10 months ago Hypertension associated with diabetes [facility])   [place] [place], Marzella Schlein, MD   1 year ago Hypertension associated with diabetes [facility])   [place] [facility] Caro Laroche, DO   1 year ago Community acquired pneumonia of left lung,  unspecified part of lung   [place] [facility] [place], Marzella Schlein, MD       Future Appointments             In 2 weeks Salena Saner, MD [facility] [place]   In 2 months W[place], Gavin Pound, NP [place] [facility] [place]   In 2 months Bacigalupo, Marzella Schlein, MD [facility], [facility]

## 2023-02-26 NOTE — Progress Notes (Signed)
Progress Note    Natasha Vasquez  FAO:[id number] DOB: 1940/06/06  DOA: 02/25/2023 PCP: Erasmo Downer, MD    Brief Narrative:     Medical records reviewed and are as summarized below:  Natasha Vasquez is a 83 y.o. 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   Assessment/Plan:   Principal Problem:   CVA (cerebrovascular accident) (HCC) Active Problems:   ICAO (internal carotid artery occlusion), left  # Acute ischemic stroke/ Left midbrain infarct/Acute left cerebral peduncle infarct Patient presented with acute onset of right-sided weakness and balance problem and found to have evidence of acute infarct in her left midbrain and cerebral peduncle.  CTA head and neck shows calcified plaque in the left ICA bulb resulting in the occlusion.  Patient transferred to [facility] for further stroke workup and diagnostic arteriogram.  Markly ataxic on the right.  Patient failed swallow screen and currently on dysphagia diet. -Neurology following, appreciate recs -Neuro interventionalists following, pending diagnostic arteriogram likely Wednesday -echocardiogram -ASA/plavix -Labs from 1/27 shows A1c 6.6%, LDL 98-- needs statin -Permissive hypertension -PT/OT/SLP eval-- CIR Eval -Frequent neurochecks   Right hip pain Patient endorsed right hip discomfort, described as aching. She endorses a history of osteoarthritis but denies any recent falls. No tenderness to palpation of the hip. -x ray normal   Asthma -As needed DuoNebs    OSA on CPAP -CPAP at bedtime    T2DM Last A1c 6.6% 2 weeks ago. -SSI with meals, CBG monitoring    Family Communication/Anticipated D/C date and plan/Code Status   DVT prophylaxis: Lovenox ordered. Code Status:  Full Code.  Family Communication: son updated at bedside Disposition Plan: Status is: Inpatient      Medical Consultants:   IR neuro    Subjective:   No SOB, no CP-- overall feeling better  Objective:    Vitals:   02/25/23 2111 02/26/23 0710 02/26/23 0830 02/26/23 1143  BP: (!) 164/89 (!) 174/94 (!) 172/101 (!) 158/107  Pulse: 64 76 79 77  Resp: 16 15 20 19   Temp: 97.8 F (36.6 C) 98 F (36.7 C)  98.2 F (36.8 C)  TempSrc: Oral Oral  Oral  SpO2: 96% 99% 93% 92%    Intake/Output Summary (Last 24 hours) at 02/26/2023 1355 Last data filed at 02/25/2023 2100 Gross per 24 hour  Intake 0 ml  Output --  Net 0 ml   There were no vitals filed for this visit.  Exam:  General: Appearance:    Obese female in no acute distress     Lungs:     respirations unlabored  Heart:    Normal heart rate.   MS:   All extremities are intact.   Neurologic:   Awake, alert     Data Reviewed:   I have personally reviewed following labs and imaging studies:  Labs: Labs show the following:   Basic Metabolic Panel: Recent Labs  Lab 02/25/23 1113 02/26/23 0317  NA 140 141  K 3.8 3.6  CL 105 104  CO2 25 24  GLUCOSE 135* 107*  BUN 14 10  CREATININE 0.86 0.91  CALCIUM 9.3 9.6  MG 2.0  --    GFR CrCl cannot be calculated (Unknown ideal weight.). Liver Function Tests: Recent Labs  Lab 02/25/23 1113  AST 34  ALT 40  ALKPHOS 71  BILITOT 0.6  PROT 7.3  ALBUMIN 4.5   No results for input(s): "LIPASE", "AMYLASE" in the last 168 hours. No results for input(s): "AMMONIA" in the last 168 hours. Coagulation profile Recent Labs  Lab 02/25/23 1113  INR 1.0    CBC: Recent Labs  Lab 02/25/23 1113 02/26/23 0317  WBC 7.1 8.9  NEUTROABS 5.1  --   HGB 12.4 12.5  HCT 38.4 37.8  MCV 90.8 88.5  PLT 301 288   Cardiac Enzymes: No results for input(s): "CKTOTAL", "CKMB", "CKMBINDEX", "TROPONINI" in the last 168 hours. BNP (last 3 results) No results for input(s):  "PROBNP" in the last 8760 hours. CBG: Recent Labs  Lab 02/25/23 2354 02/26/23 0358 02/26/23 0810 02/26/23 1217  GLUCAP 103* 110* 117* 118*   D-Dimer: No results for input(s): "DDIMER" in the last 72 hours. Hgb A1c: No results for input(s): "HGBA1C" in the last 72 hours. Lipid Profile: Recent Labs    02/26/23 0317  CHOL 160  HDL 29*  LDLCALC UNABLE TO CALCULATE IF TRIGLYCERIDE OVER 400 mg/dL  TRIG 629*  CHOLHDL 5.5  LDLDIRECT 81   Thyroid function studies: Recent Labs    02/25/23 1237  TSH 3.325   Anemia work up: No results for input(s): "VITAMINB12", "FOLATE", "FERRITIN", "TIBC", "IRON", "RETICCTPCT" in the last 72 hours. Sepsis Labs: Recent Labs  Lab 02/25/23 1113 02/26/23 0317  WBC 7.1 8.9    Microbiology No results found for this or any previous visit (from the past 240 hours).  Procedures and diagnostic studies:  ECHOCARDIOGRAM COMPLETE Result Date: 02/26/2023    ECHOCARDIOGRAM REPORT   Patient Name:   Natasha Vasquez Date of Exam: 02/26/2023 Medical Rec #:  [id number]         Height:       64.0 in Accession #:    [id number]        Weight:       196.0 lb Date of Birth:  02/07/1940          BSA:          1.940 m Patient Age:    82 years          BP:           174/94 mmHg Patient Gender: F                 HR:           72 bpm. Exam Location:  Inpatient Procedure: 2D Echo, Cardiac Doppler, Color Doppler, Saline Contrast Bubble Study            and Intracardiac Opacification Agent Indications:    Stroke  History:        Patient has prior history of Echocardiogram examinations, most                 recent 09/05/2022. Risk Factors:Hypertension, Diabetes,                 Dyslipidemia and Former Smoker.  Sonographer:    Karma Ganja Referring Phys: [id number] PROSPER M AMPONSAH  Sonographer Comments: Technically difficult study due to poor echo windows and patient is obese. IMPRESSIONS  1. Left ventricular ejection fraction, by estimation, is 55 to 60%. The left ventricle has  normal function. The left ventricle has no regional wall motion abnormalities. There is mild asymmetric left ventricular hypertrophy of the septal segment. Left ventricular diastolic parameters are consistent with Grade I diastolic dysfunction (impaired relaxation).  2. Right  ventricular systolic function is normal. The right ventricular size is normal. There is normal pulmonary artery systolic pressure. The estimated right ventricular systolic pressure is 30.9 mmHg.  3. The mitral valve is grossly normal. No evidence of mitral valve regurgitation. No evidence of mitral stenosis.  4. The aortic valve is tricuspid. There is mild calcification of the aortic valve. There is mild thickening of the aortic valve. Aortic valve regurgitation is not visualized. Aortic valve sclerosis is present, with no evidence of aortic valve stenosis.  5. The inferior vena cava is normal in size with greater than 50% respiratory variability, suggesting right atrial pressure of 3 mmHg.  6. Agitated saline contrast bubble study was negative, with no evidence of any interatrial shunt. Comparison(s): No significant change from prior study. Conclusion(s)/Recommendation(s): No intracardiac source of embolism detected on this transthoracic study. Consider a transesophageal echocardiogram to exclude cardiac source of embolism if clinically indicated. FINDINGS  Left Ventricle: Left ventricular ejection fraction, by estimation, is 55 to 60%. The left ventricle has normal function. The left ventricle has no regional wall motion abnormalities. Definity contrast agent was given IV to delineate the left ventricular  endocardial borders. The left ventricular internal cavity size was normal in size. There is mild asymmetric left ventricular hypertrophy of the septal segment. Left ventricular diastolic parameters are consistent with Grade I diastolic dysfunction (impaired relaxation). Right Ventricle: The right ventricular size is normal. No increase in  right ventricular wall thickness. Right ventricular systolic function is normal. There is normal pulmonary artery systolic pressure. The tricuspid regurgitant velocity is 2.64 m/s, and  with an assumed right atrial pressure of 3 mmHg, the estimated right ventricular systolic pressure is 30.9 mmHg. Left Atrium: Left atrial size was normal in size. Right Atrium: Right atrial size was normal in size. Pericardium: There is no evidence of pericardial effusion. Presence of epicardial fat layer. Mitral Valve: The mitral valve is grossly normal. No evidence of mitral valve regurgitation. No evidence of mitral valve stenosis. Tricuspid Valve: The tricuspid valve is grossly normal. Tricuspid valve regurgitation is trivial. No evidence of tricuspid stenosis. Aortic Valve: The aortic valve is tricuspid. There is mild calcification of the aortic valve. There is mild thickening of the aortic valve. Aortic valve regurgitation is not visualized. Aortic valve sclerosis is present, with no evidence of aortic valve stenosis. Aortic valve mean gradient measures 6.0 mmHg. Aortic valve peak gradient measures 10.9 mmHg. Aortic valve area, by VTI measures 2.64 cm. Pulmonic Valve: The pulmonic valve was grossly normal. Pulmonic valve regurgitation is not visualized. No evidence of pulmonic stenosis. Aorta: The aortic root is normal in size and structure. Venous: The inferior vena cava is normal in size with greater than 50% respiratory variability, suggesting right atrial pressure of 3 mmHg. IAS/Shunts: The atrial septum is grossly normal. Agitated saline contrast was given intravenously to evaluate for intracardiac shunting. Agitated saline contrast bubble study was negative, with no evidence of any interatrial shunt.  LEFT VENTRICLE PLAX 2D LVIDd:         4.10 cm     Diastology LVIDs:         2.70 cm     LV e' medial:    5.77 cm/s LV PW:         0.90 cm     LV E/e' medial:  12.9 LV IVS:        1.20 cm     LV e' lateral:   9.68 cm/s LVOT  diam:     2.10  cm     LV E/e' lateral: 7.7 LV SV:         81 LV SV Index:   42 LVOT Area:     3.46 cm  LV Volumes (MOD) LV vol d, MOD A2C: 83.3 ml LV vol d, MOD A4C: 63.6 ml LV vol s, MOD A2C: 30.2 ml LV vol s, MOD A4C: 36.0 ml LV SV MOD A2C:     53.1 ml LV SV MOD A4C:     63.6 ml LV SV MOD BP:      38.7 ml RIGHT VENTRICLE             IVC RV Basal diam:  3.60 cm     IVC diam: 1.60 cm RV S prime:     14.30 cm/s TAPSE (M-mode): 2.6 cm LEFT ATRIUM             Index        RIGHT ATRIUM           Index LA diam:        3.90 cm 2.01 cm/m   RA Area:     17.60 cm LA Vol (A2C):   94.1 ml 48.51 ml/m  RA Volume:   46.90 ml  24.18 ml/m LA Vol (A4C):   43.1 ml 22.22 ml/m LA Biplane Vol: 62.9 ml 32.43 ml/m  AORTIC VALVE AV Area (Vmax):    2.62 cm AV Area (Vmean):   2.65 cm AV Area (VTI):     2.64 cm AV Vmax:           165.00 cm/s AV Vmean:          111.000 cm/s AV VTI:            0.306 m AV Peak Grad:      10.9 mmHg AV Mean Grad:      6.0 mmHg LVOT Vmax:         125.00 cm/s LVOT Vmean:        84.900 cm/s LVOT VTI:          0.233 m LVOT/AV VTI ratio: 0.76  AORTA Ao Root diam: 3.50 cm Ao Asc diam:  3.30 cm MITRAL VALVE               TRICUSPID VALVE MV Area (PHT): 2.93 cm    TR Peak grad:   27.9 mmHg MV Decel Time: 259 msec    TR Vmax:        264.00 cm/s MV E velocity: 74.40 cm/s MV A velocity: 89.60 cm/s  SHUNTS MV E/A ratio:  0.83        Systemic VTI:  0.23 m                            Systemic Diam: 2.10 cm Lennie Odor MD Electronically signed by Lennie Odor MD Signature Date/Time: 02/26/2023/10:56:16 AM    Final    DG HIP UNILAT WITH PELVIS 1V RIGHT Result Date: 02/26/2023 CLINICAL DATA:  83 year old female with history of right-sided hip pain. EXAM: DG HIP (WITH OR WITHOUT PELVIS) 1V RIGHT COMPARISON:  No priors. FINDINGS: AP view of the bony pelvis and AP view of the right hip demonstrate no definite acute displaced fracture of the bony pelvic ring. Bilateral proximal femurs as visualized appear intact. Patient  is status post right hip hemiarthroplasty. Prosthetic femoral head projects within the acetabulum on this single view examination. No definite periprosthetic fracture or other acute abnormality.  Iodinated contrast material is noted within the lumen of the urinary bladder, presumably related to recent contrast-enhanced CT examinations. IMPRESSION: 1. No acute radiographic abnormality of the bony pelvis or the right hip. 2. Status post right hip hemiarthroplasty. Electronically Signed   By: Trudie Reed M.D.   On: 02/26/2023 06:12   MR BRAIN WO CONTRAST Result Date: 02/25/2023 CLINICAL DATA:  Neuro deficit, acute, stroke suspected. Right-sided weakness. EXAM: MRI HEAD WITHOUT CONTRAST TECHNIQUE: Multiplanar, multiecho pulse sequences of the brain and surrounding structures were obtained without intravenous contrast. COMPARISON:  CT studies same day. FINDINGS: Brain: Diffusion imaging shows acute infarction within the left midbrain and left cerebral peduncle. No other acute infarction. 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 No focal cerebellar insult. Cerebral hemispheres otherwise show mild chronic small-vessel ischemic change of the white matter. No large vessel territory stroke. No mass lesion, hemorrhage, hydrocephalus or extra-axial collection. Vascular: Major vessels at the base of the brain show flow. Skull and upper cervical spine: Negative Sinuses/Orbits: Clear/normal Other: None IMPRESSION: 1. 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 2. 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 3. Mild chronic small-vessel ischemic change of the cerebral hemispheric white matter. Electronically Signed   By: Paulina Fusi M.D.   On: 02/25/2023 19:55   CT ANGIO HEAD NECK W WO CM Result Date: 02/25/2023 CLINICAL DATA:  Neuro deficit, acute, stroke suspected. Extremity weakness. Facial droop. Hypertension and diabetes. EXAM: CT ANGIOGRAPHY HEAD AND NECK WITH AND WITHOUT  CONTRAST TECHNIQUE: Multidetector CT imaging of the head and neck was performed using the standard protocol during bolus administration of intravenous contrast. Multiplanar CT image reconstructions and MIPs were obtained to evaluate the vascular anatomy. Carotid stenosis measurements (when applicable) are obtained utilizing NASCET criteria, using the distal internal carotid diameter as the denominator. RADIATION DOSE REDUCTION: This exam was performed according to the departmental dose-optimization program which includes automated exposure control, adjustment of the mA and/or kV according to patient size and/or use of iterative reconstruction technique. CONTRAST:  60mL OMNIPAQUE IOHEXOL 350 MG/ML SOLN COMPARISON:  Head CT earlier same day.  Ultrasound 04/28/2021. FINDINGS: CTA NECK FINDINGS Aortic arch: Aortic atherosclerosis. Branching pattern is normal without origin stenosis. Right carotid system: Common carotid artery widely patent to the bifurcation. No soft or calcified plaque at the bifurcation. Right ICA does not show any focal stenosis but is smaller than the left. Left carotid system: Common carotid artery widely patent to the bifurcation. Calcified plaque at the carotid bifurcation and ICA bulb. Minimal diameter in the ICA bulb less than 1 mm. Near occlusion. Vessel regains a more normal caliber at the distal bulb and is patent through the cervical region to the skull base beyond that. Vertebral arteries: Both vertebral artery origins widely patent. Both vertebral arteries patent through the cervical region to the foramen magnum. Skeleton: Chronic mid cervical spondylosis. Other neck: No mass or lymphadenopathy. Upper chest: Scarring at the lung apices.  No acute finding. Review of the MIP images confirms the above findings CTA HEAD FINDINGS Anterior circulation: Both internal carotid arteries are patent through the skull base and siphon regions. Ordinary siphon atherosclerotic calcification but no  stenosis greater than 30%. The anterior and middle cerebral vessels are patent. Both anterior cerebral arteries receive there supply from the left carotid circulation. Right internal carotid artery supplies only the middle cerebral artery territory. No large vessel occlusion. No proximal stenosis. No aneurysm or vascular malformation. Posterior circulation: Both vertebral arteries patent through  the foramen magnum to the basilar artery. No basilar stenosis. Posterior circulation branch vessels are patent. Left PCA receives most of it supply from the anterior circulation. Venous sinuses: Patent and normal. Anatomic variants: None significant otherwise. Review of the MIP images confirms the above findings IMPRESSION: 1. Aortic atherosclerosis. 2. Calcified plaque at the left carotid bifurcation and ICA bulb. Minimal diameter in the ICA bulb less than 1 mm. Near occlusion. Vessel regains a more normal caliber at the distal bulb and is patent through the cervical region to the skull base beyond that. This is of particular importance in this patient as the left internal carotid artery supplies the majority of the brain, as the right ICA essentially gives isolated supply to the right middle cerebral artery territory only. Urgent vascular surgery consultation suggested. 3. No intracranial large or medium vessel occlusion or correctable proximal stenosis. Electronically Signed   By: Paulina Fusi M.D.   On: 02/25/2023 15:13   CT Head Wo Contrast Result Date: 02/25/2023 CLINICAL DATA:  Neuro deficit, acute, stroke suspected EXAM: CT HEAD WITHOUT CONTRAST TECHNIQUE: Contiguous axial images were obtained from the base of the skull through the vertex without intravenous contrast. RADIATION DOSE REDUCTION: This exam was performed according to the departmental dose-optimization program which includes automated exposure control, adjustment of the mA and/or kV according to patient size and/or use of iterative reconstruction  technique. COMPARISON:  None Available. FINDINGS: Brain: No hemorrhage. No hydrocephalus. No extra-axial fluid collection. No mass effect. No mass lesion. 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 Skull: Normal. Negative for fracture or focal lesion. Sinuses/Orbits: No middle ear or mastoid effusion. Paranasal sinuses are clear. Bilateral lens replacement. Orbits are otherwise unremarkable. Other: None. 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 Consider further evaluation with MRI. Electronically Signed   By: Lorenza Cambridge M.D.   On: 02/25/2023 11:53    Medications:    [START ON 02/27/2023] aspirin  81 mg Oral Daily   [START ON 02/27/2023] clopidogrel  75 mg Oral Daily   enoxaparin (LOVENOX) injection  40 mg Subcutaneous Q24H   insulin aspart  0-15 Units Subcutaneous TID WC   rosuvastatin  20 mg Oral Daily   Continuous Infusions:   LOS: 1 day   Joseph Art  [facility]   How to contact the [facility] Attending or Consulting provider 7A - 7P or covering provider during after hours 7P -7A, for this patient?  Check the care team in [facility] and look for a) attending/consulting [facility] provider listed and b) the [facility] team listed Log into [email or web address] and use [place]'s universal password to access. If you do not have the password, please contact the hospital operator. Locate the [facility] provider you are looking for under [facility] and page to a number that you can be directly reached. If you still have difficulty reaching the provider, please page the [facility] (Director on Call) for the Hospitalists listed on amion for assistance.  02/26/2023, 1:55 PM

## 2023-02-26 NOTE — Progress Notes (Signed)
Discussed with pharmacy who clarified that plavix can be crushed and given with applesauce to patients on a dysphagia diet. IR will order DAPT.  Bing Neighbors, MD [facility] [phone]  If 7pm- 7am, please page neurology on call as listed in AMION.

## 2023-02-26 NOTE — Plan of Care (Signed)

## 2023-02-26 NOTE — Evaluation (Signed)
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  ER/PR NEGATIVE   Cataract    Chronic cough    Complication of anesthesia    20 years ago pts b/p dropped really low, "coded"   Depression    Depression    Diabetes mellitus without complication (HCC)    diet controlled   Family history of adverse reaction to anesthesia    son PONV   GERD (gastroesophageal reflux disease)    Gout    Hypercholesteremia    Hyperlipidemia    Hypertension    Melanoma (HCC)    MVP (mitral valve prolapse)    followed by PCP   Nephritis    Nephritis    S/P appendectomy    Thoracic aortic aneurysm (HCC)    Torn meniscus    left   Past Surgical History:  Past Surgical History:  Procedure Laterality Date   ABDOMINAL HYSTERECTOMY     APPENDECTOMY     BREAST BIOPSY Right 04/25/2017   retroareolar 10:00   wing clip   path pending   BREAST BIOPSY Right 04/25/2017   10:00  5CMFN  venus clip    path pending   BREAST BIOPSY Right 05/03/2017   Affirm Bx- path pending   BREAST CYST ASPIRATION Bilateral    NEG   BREAST EXCISIONAL BIOPSY Left 20+ yrs ago   NEG   BREAST RECONSTRUCTION WITH PLACEMENT OF TISSUE EXPANDER AND FLEX HD (ACELLULAR HYDRATED DERMIS) Right 06/03/2017   Procedure: BREAST RECONSTRUCTION WITH PLACEMENT OF TISSUE EXPANDER AND FLEX HD (ACELLULAR HYDRATED DERMIS);  Surgeon: Peggye Form, DO;  Location: [facility];  Service: Plastics;  Laterality: Right;   BREAST REDUCTION SURGERY Left 11/06/2017   Procedure: BREAST REDUCTION;  Surgeon: Peggye Form, DO;  Location: [facility];  Service: Plastics;  Laterality: Left;   BROW LIFT Bilateral 02/01/2015   Procedure: BLEPHAROPLASTY bilateral upper eyelids.;  Surgeon: Imagene Riches, MD;  Location: [facility];  Service: Ophthalmology;  Laterality: Bilateral;  DIABETIC - diet controlled   CATARACT EXTRACTION W/PHACO Left 01/14/2019   Procedure: CATARACT EXTRACTION PHACO AND INTRAOCULAR LENS PLACEMENT (IOC) LEFT TECNIS TORIC ADD 5.62 00:46.5 30.0%;  Surgeon: Lockie Mola, MD;  Location: [facility];  Service: Ophthalmology;  Laterality: Left;  diabetic - diet controlled   CATARACT EXTRACTION W/PHACO Right 02/11/2019   Procedure: CATARACT EXTRACTION PHACO AND INTRAOCULAR LENS PLACEMENT (IOC) RIGHT DIABETIC 4.82 00:46.9 10.3%;  Surgeon: Lockie Mola, MD;  Location: [facility];  Service: Ophthalmology;  Laterality: Right;  Diabetic - diet controlled   CHOLECYSTECTOMY     COLONOSCOPY     COLONOSCOPY WITH PROPOFOL N/A 05/11/2015   Procedure: COLONOSCOPY WITH PROPOFOL;  Surgeon: Scot Jun, MD;  Location: [facility];  Service: Endoscopy;  Laterality: N/A;   COLONOSCOPY WITH PROPOFOL N/A 09/07/2020   Procedure: COLONOSCOPY WITH PROPOFOL;  Surgeon: Toledo, Boykin Nearing, MD;  Location: [facility];  Service: Gastroenterology;  Laterality: N/A;   ESOPHAGOGASTRODUODENOSCOPY     ESOPHAGOGASTRODUODENOSCOPY (EGD) WITH PROPOFOL  05/11/2015  Procedure: ESOPHAGOGASTRODUODENOSCOPY (EGD) WITH PROPOFOL;  Surgeon: Scot Jun, MD;  Location: [facility];  Service: Endoscopy;;   FRACTURE SURGERY Right    arm and shoulder   JOINT REPLACEMENT     KNEE ARTHROSCOPY Left 03/14/2016   Procedure: ARTHROSCOPY KNEE, PARTIAL MEDIAL MENISECTOMY, CHONDROPLASTY;  Surgeon: Donato Heinz, MD;  Location: [facility];  Service: Orthopedics;  Laterality: Left;   MASTECTOMY Right 2019   MASTECTOMY W/ SENTINEL NODE BIOPSY Right 06/03/2017   Procedure: MASTECTOMY WITH SENTINEL LYMPH  NODE BIOPSY;  Surgeon: Earline Mayotte, MD;  Location: [facility];  Service: General;  Laterality: Right;   PARTIAL HIP ARTHROPLASTY Right    REDUCTION MAMMAPLASTY Left 2019   REMOVAL OF BILATERAL TISSUE EXPANDERS WITH PLACEMENT OF BILATERAL BREAST IMPLANTS Right 11/06/2017   Procedure: REMOVAL OF TISSUE EXPANDER WITH PLACEMENT OF BREAST IMPLANT;  Surgeon: Peggye Form, DO;  Location: [facility];  Service: Plastics;  Laterality: Right;   TONSILLECTOMY     HPI:  Natasha Vasquez is a 83 y.o. female presented to the [facility] ED 2/10 for evaluation of right-sided weakness and balance problems. 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 Patient transferred to [facility] same day for potential arteriogram for 02/26/23. PHMx: HTN, T2DM, HLD, depression, OSA with CPAP, osteoarthritis, ascending thoracic aortic aneurysm, asthma and GERD, Barretts esophagus. BSE 2/10 at [facility] revealed s/s pharyngeal dysphagia and recommended Dys 3, nectar thick liquids with right head turn until MBS can be performed.    Assessment / Plan / Recommendation  Clinical Impression  Pt seen yesterday at [facility] with recommendation of Dys 3 texture, nectar thick liquids and head turn to the right. Pt reported coughing with liquids intermittently for the past year. Dentition is intact. 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 Oral phase across consistencies was adequate without delays or lingual residue. Nectar thick liquids consumed (instructed not to perform the right head turn) without s/s aspiration consistently. Majority of thin liquid consumed without s/s aspiration via cup and straw however at the end of evaluation she had strong reflexive cough with straw sip thin liquid due to possible airway intrusion. SLP recommends continue nectar thick liquids and although mastication appeared functional  would continue Dys 3 texture until MBS. There is an arteriogram planned for tomorrow and MBS is determinant on completion of her procedure. Discussed with pt and son with recommendations including no need for right head turn. SLP Visit Diagnosis: Dysphagia, unspecified (R13.10)    Aspiration Risk  Mild aspiration risk    Diet Recommendation Dysphagia 3 (Mech soft);Nectar-thick liquid    Liquid Administration via: Cup Medication Administration: Whole meds with puree Supervision: Patient able to self feed Compensations: Slow rate;Small sips/bites Postural Changes: Remain upright for at least 30 minutes after po intake;Seated upright at 90 degrees    Other  Recommendations Oral Care Recommendations: Oral care BID;Oral care before and after PO;Patient independent with oral care    Recommendations for follow up therapy are one component of a multi-disciplinary discharge planning process, led by the attending physician.  Recommendations may be updated based on patient status, additional functional criteria and insurance authorization.  Follow up Recommendations  (TBD)      Assistance Recommended at Discharge    Functional Status Assessment Patient has had a recent decline in their functional status and demonstrates the ability to make significant improvements in function in a  reasonable and predictable amount of time.  Frequency and Duration min 2x/week  2 weeks       Prognosis Prognosis for improved oropharyngeal function: Good Barriers/Prognosis Comment: GERD/Barrett's esophagus; bell's palsy baseline      Swallow Study   General Date of Onset: 02/25/23 HPI: Natasha Vasquez is a 83 y.o. female presented to the [facility] ED 2/10 for evaluation of right-sided weakness and balance problems. 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 Patient transferred to [facility] same day for potential arteriogram for 02/26/23. PHMx: HTN, T2DM, HLD, depression, OSA with  CPAP, osteoarthritis, ascending thoracic aortic aneurysm, asthma and GERD, Barretts esophagus. BSE 2/10 at [facility] revealed s/s pharyngeal dysphagia and recommended Dys 3, nectar thick liquids with right head turn until MBS can be performed. Type of Study: Bedside Swallow Evaluation Previous Swallow Assessment:  (see HPI) Diet Prior to this Study: Dysphagia 3 (mechanical soft);Mildly thick liquids (Level 2, nectar thick) Temperature Spikes Noted: No Respiratory Status: Room air History of Recent Intubation: No Behavior/Cognition: Alert;Cooperative;Pleasant mood Oral Cavity Assessment: Within Functional Limits Oral Care Completed by SLP: No Oral Cavity - Dentition: Adequate natural dentition Vision: Functional for self-feeding Self-Feeding Abilities: Able to feed self Patient Positioning: Upright in bed Baseline Vocal Quality: Normal Volitional Cough: Strong Volitional Swallow: Able to elicit    Oral/Motor/Sensory Function Overall Oral Motor/Sensory Function: Mild impairment Facial ROM: Reduced right;Suspected CN VII (facial) dysfunction Facial Symmetry: Abnormal symmetry right;Suspected CN VII (facial) dysfunction Lingual ROM: Within Functional Limits Lingual Symmetry: Abnormal symmetry right;Suspected CN XII (hypoglossal) dysfunction (minimal)   Ice Chips Ice chips: Not tested   Thin Liquid Thin Liquid: Impaired Presentation: Straw Oral Phase Impairments:  (none) Pharyngeal  Phase Impairments: Cough - Immediate    Nectar Thick Nectar Thick Liquid: Within functional limits Presentation: Cup;Self Fed Oral Phase Impairments:  (none) Pharyngeal Phase Impairments:  (none)   Honey Thick Honey Thick Liquid: Not tested   Puree Puree: Not tested   Solid     Solid: Within functional limits Presentation: Self Fed      Royce Macadamia 02/26/2023,2:32 PM

## 2023-02-26 NOTE — Progress Notes (Addendum)
STROKE TEAM PROGRESS NOTE   INTERIM HISTORY/SUBJECTIVE Patient was admitted with incoordination and weakness of her right side.  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  She will have an diagnostic angiogram with possible treatment consideration.  Neurological exam is unchanged.  Vital signs are stable.  OBJECTIVE  CBC    Component Value Date/Time   WBC 8.9 02/26/2023 0317   RBC 4.27 02/26/2023 0317   HGB 12.5 02/26/2023 0317   HGB 10.4 (L) 09/17/2018 1523   HCT 37.8 02/26/2023 0317   HCT 33.0 (L) 09/17/2018 1523   PLT 288 02/26/2023 0317   PLT 343 09/17/2018 1523   MCV 88.5 02/26/2023 0317   MCV 78 (L) 09/17/2018 1523   MCH 29.3 02/26/2023 0317   MCHC 33.1 02/26/2023 0317   RDW 15.3 02/26/2023 0317   RDW 16.5 (H) 09/17/2018 1523   LYMPHSABS 1.3 02/25/2023 1113   LYMPHSABS 2.1 09/17/2018 1523   MONOABS 0.5 02/25/2023 1113   EOSABS 0.1 02/25/2023 1113   EOSABS 0.4 09/17/2018 1523   BASOSABS 0.1 02/25/2023 1113   BASOSABS 0.1 09/17/2018 1523    BMET    Component Value Date/Time   NA 141 02/26/2023 0317   NA 139 02/11/2023 0945   K 3.6 02/26/2023 0317   CL 104 02/26/2023 0317   CO2 24 02/26/2023 0317   GLUCOSE 107 (H) 02/26/2023 0317   BUN 10 02/26/2023 0317   BUN 18 02/11/2023 0945   CREATININE 0.91 02/26/2023 0317   CALCIUM 9.6 02/26/2023 0317   EGFR 44 (L) 02/11/2023 0945   GFRNONAA >60 02/26/2023 0317    IMAGING past 24 hours ECHOCARDIOGRAM COMPLETE Result Date: 02/26/2023    ECHOCARDIOGRAM REPORT   Patient Name:   Natasha Vasquez Date of Exam: 02/26/2023 Medical Rec #:  [id number]         Height:       64.0 in Accession #:    [id number]        Weight:       196.0 lb Date of Birth:  1940/06/15          BSA:          1.940 m Patient Age:    82 years          BP:           174/94 mmHg Patient Gender: F                 HR:           72 bpm. Exam Location:  Inpatient Procedure: 2D Echo, Cardiac Doppler, Color  Doppler, Saline Contrast Bubble Study            and Intracardiac Opacification Agent Indications:    Stroke  History:        Patient has prior history of Echocardiogram examinations, most                 recent 09/05/2022. Risk Factors:Hypertension, Diabetes,                 Dyslipidemia and Former Smoker.  Sonographer:    Karma Ganja Referring Phys: [id number] PROSPER M AMPONSAH  Sonographer Comments: Technically difficult study due to poor echo windows and patient is obese. IMPRESSIONS  1. Left ventricular ejection fraction, by estimation, is 55 to 60%. The left ventricle has normal function. The left ventricle has no regional wall motion abnormalities. There is mild asymmetric left ventricular  hypertrophy of the septal segment. Left ventricular diastolic parameters are consistent with Grade I diastolic dysfunction (impaired relaxation).  2. Right ventricular systolic function is normal. The right ventricular size is normal. There is normal pulmonary artery systolic pressure. The estimated right ventricular systolic pressure is 30.9 mmHg.  3. The mitral valve is grossly normal. No evidence of mitral valve regurgitation. No evidence of mitral stenosis.  4. The aortic valve is tricuspid. There is mild calcification of the aortic valve. There is mild thickening of the aortic valve. Aortic valve regurgitation is not visualized. Aortic valve sclerosis is present, with no evidence of aortic valve stenosis.  5. The inferior vena cava is normal in size with greater than 50% respiratory variability, suggesting right atrial pressure of 3 mmHg.  6. Agitated saline contrast bubble study was negative, with no evidence of any interatrial shunt. Comparison(s): No significant change from prior study. Conclusion(s)/Recommendation(s): No intracardiac source of embolism detected on this transthoracic study. Consider a transesophageal echocardiogram to exclude cardiac source of embolism if clinically indicated. FINDINGS  Left Ventricle:  Left ventricular ejection fraction, by estimation, is 55 to 60%. The left ventricle has normal function. The left ventricle has no regional wall motion abnormalities. Definity contrast agent was given IV to delineate the left ventricular  endocardial borders. The left ventricular internal cavity size was normal in size. There is mild asymmetric left ventricular hypertrophy of the septal segment. Left ventricular diastolic parameters are consistent with Grade I diastolic dysfunction (impaired relaxation). Right Ventricle: The right ventricular size is normal. No increase in right ventricular wall thickness. Right ventricular systolic function is normal. There is normal pulmonary artery systolic pressure. The tricuspid regurgitant velocity is 2.64 m/s, and  with an assumed right atrial pressure of 3 mmHg, the estimated right ventricular systolic pressure is 30.9 mmHg. Left Atrium: Left atrial size was normal in size. Right Atrium: Right atrial size was normal in size. Pericardium: There is no evidence of pericardial effusion. Presence of epicardial fat layer. Mitral Valve: The mitral valve is grossly normal. No evidence of mitral valve regurgitation. No evidence of mitral valve stenosis. Tricuspid Valve: The tricuspid valve is grossly normal. Tricuspid valve regurgitation is trivial. No evidence of tricuspid stenosis. Aortic Valve: The aortic valve is tricuspid. There is mild calcification of the aortic valve. There is mild thickening of the aortic valve. Aortic valve regurgitation is not visualized. Aortic valve sclerosis is present, with no evidence of aortic valve stenosis. Aortic valve mean gradient measures 6.0 mmHg. Aortic valve peak gradient measures 10.9 mmHg. Aortic valve area, by VTI measures 2.64 cm. Pulmonic Valve: The pulmonic valve was grossly normal. Pulmonic valve regurgitation is not visualized. No evidence of pulmonic stenosis. Aorta: The aortic root is normal in size and structure. Venous: The  inferior vena cava is normal in size with greater than 50% respiratory variability, suggesting right atrial pressure of 3 mmHg. IAS/Shunts: The atrial septum is grossly normal. Agitated saline contrast was given intravenously to evaluate for intracardiac shunting. Agitated saline contrast bubble study was negative, with no evidence of any interatrial shunt.  LEFT VENTRICLE PLAX 2D LVIDd:         4.10 cm     Diastology LVIDs:         2.70 cm     LV e' medial:    5.77 cm/s LV PW:         0.90 cm     LV E/e' medial:  12.9 LV IVS:  1.20 cm     LV e' lateral:   9.68 cm/s LVOT diam:     2.10 cm     LV E/e' lateral: 7.7 LV SV:         81 LV SV Index:   42 LVOT Area:     3.46 cm  LV Volumes (MOD) LV vol d, MOD A2C: 83.3 ml LV vol d, MOD A4C: 63.6 ml LV vol s, MOD A2C: 30.2 ml LV vol s, MOD A4C: 36.0 ml LV SV MOD A2C:     53.1 ml LV SV MOD A4C:     63.6 ml LV SV MOD BP:      38.7 ml RIGHT VENTRICLE             IVC RV Basal diam:  3.60 cm     IVC diam: 1.60 cm RV S prime:     14.30 cm/s TAPSE (M-mode): 2.6 cm LEFT ATRIUM             Index        RIGHT ATRIUM           Index LA diam:        3.90 cm 2.01 cm/m   RA Area:     17.60 cm LA Vol (A2C):   94.1 ml 48.51 ml/m  RA Volume:   46.90 ml  24.18 ml/m LA Vol (A4C):   43.1 ml 22.22 ml/m LA Biplane Vol: 62.9 ml 32.43 ml/m  AORTIC VALVE AV Area (Vmax):    2.62 cm AV Area (Vmean):   2.65 cm AV Area (VTI):     2.64 cm AV Vmax:           165.00 cm/s AV Vmean:          111.000 cm/s AV VTI:            0.306 m AV Peak Grad:      10.9 mmHg AV Mean Grad:      6.0 mmHg LVOT Vmax:         125.00 cm/s LVOT Vmean:        84.900 cm/s LVOT VTI:          0.233 m LVOT/AV VTI ratio: 0.76  AORTA Ao Root diam: 3.50 cm Ao Asc diam:  3.30 cm MITRAL VALVE               TRICUSPID VALVE MV Area (PHT): 2.93 cm    TR Peak grad:   27.9 mmHg MV Decel Time: 259 msec    TR Vmax:        264.00 cm/s MV E velocity: 74.40 cm/s MV A velocity: 89.60 cm/s  SHUNTS MV E/A ratio:  0.83        Systemic  VTI:  0.23 m                            Systemic Diam: 2.10 cm Lennie Odor MD Electronically signed by Lennie Odor MD Signature Date/Time: 02/26/2023/10:56:16 AM    Final    DG HIP UNILAT WITH PELVIS 1V RIGHT Result Date: 02/26/2023 CLINICAL DATA:  83 year old female with history of right-sided hip pain. EXAM: DG HIP (WITH OR WITHOUT PELVIS) 1V RIGHT COMPARISON:  No priors. FINDINGS: AP view of the bony pelvis and AP view of the right hip demonstrate no definite acute displaced fracture of the bony pelvic ring. Bilateral proximal femurs as visualized appear intact. Patient is status post right hip hemiarthroplasty.  Prosthetic femoral head projects within the acetabulum on this single view examination. No definite periprosthetic fracture or other acute abnormality. Iodinated contrast material is noted within the lumen of the urinary bladder, presumably related to recent contrast-enhanced CT examinations. IMPRESSION: 1. No acute radiographic abnormality of the bony pelvis or the right hip. 2. Status post right hip hemiarthroplasty. Electronically Signed   By: Trudie Reed M.D.   On: 02/26/2023 06:12   MR BRAIN WO CONTRAST Result Date: 02/25/2023 CLINICAL DATA:  Neuro deficit, acute, stroke suspected. Right-sided weakness. EXAM: MRI HEAD WITHOUT CONTRAST TECHNIQUE: Multiplanar, multiecho pulse sequences of the brain and surrounding structures were obtained without intravenous contrast. COMPARISON:  CT studies same day. FINDINGS: Brain: Diffusion imaging shows acute infarction within the left midbrain and left cerebral peduncle. No other acute infarction. 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 No focal cerebellar insult. Cerebral hemispheres otherwise show mild chronic small-vessel ischemic change of the white matter. No large vessel territory stroke. No mass lesion, hemorrhage, hydrocephalus or extra-axial collection. Vascular: Major vessels at the base of the brain show flow. Skull and upper  cervical spine: Negative Sinuses/Orbits: Clear/normal Other: None IMPRESSION: 1. 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 2. 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 3. Mild chronic small-vessel ischemic change of the cerebral hemispheric white matter. Electronically Signed   By: Paulina Fusi M.D.   On: 02/25/2023 19:55   CT ANGIO HEAD NECK W WO CM Result Date: 02/25/2023 CLINICAL DATA:  Neuro deficit, acute, stroke suspected. Extremity weakness. Facial droop. Hypertension and diabetes. EXAM: CT ANGIOGRAPHY HEAD AND NECK WITH AND WITHOUT CONTRAST TECHNIQUE: Multidetector CT imaging of the head and neck was performed using the standard protocol during bolus administration of intravenous contrast. Multiplanar CT image reconstructions and MIPs were obtained to evaluate the vascular anatomy. Carotid stenosis measurements (when applicable) are obtained utilizing NASCET criteria, using the distal internal carotid diameter as the denominator. RADIATION DOSE REDUCTION: This exam was performed according to the departmental dose-optimization program which includes automated exposure control, adjustment of the mA and/or kV according to patient size and/or use of iterative reconstruction technique. CONTRAST:  60mL OMNIPAQUE IOHEXOL 350 MG/ML SOLN COMPARISON:  Head CT earlier same day.  Ultrasound 04/28/2021. FINDINGS: CTA NECK FINDINGS Aortic arch: Aortic atherosclerosis. Branching pattern is normal without origin stenosis. Right carotid system: Common carotid artery widely patent to the bifurcation. No soft or calcified plaque at the bifurcation. Right ICA does not show any focal stenosis but is smaller than the left. Left carotid system: Common carotid artery widely patent to the bifurcation. Calcified plaque at the carotid bifurcation and ICA bulb. Minimal diameter in the ICA bulb less than 1 mm. Near occlusion. Vessel regains a more normal caliber at the distal bulb and is patent  through the cervical region to the skull base beyond that. Vertebral arteries: Both vertebral artery origins widely patent. Both vertebral arteries patent through the cervical region to the foramen magnum. Skeleton: Chronic mid cervical spondylosis. Other neck: No mass or lymphadenopathy. Upper chest: Scarring at the lung apices.  No acute finding. Review of the MIP images confirms the above findings CTA HEAD FINDINGS Anterior circulation: Both internal carotid arteries are patent through the skull base and siphon regions. Ordinary siphon atherosclerotic calcification but no stenosis greater than 30%. The anterior and middle cerebral vessels are patent. Both anterior cerebral arteries receive there supply from the left carotid circulation. Right internal carotid artery supplies only the middle cerebral artery  territory. No large vessel occlusion. No proximal stenosis. No aneurysm or vascular malformation. Posterior circulation: Both vertebral arteries patent through the foramen magnum to the basilar artery. No basilar stenosis. Posterior circulation branch vessels are patent. Left PCA receives most of it supply from the anterior circulation. Venous sinuses: Patent and normal. Anatomic variants: None significant otherwise. Review of the MIP images confirms the above findings IMPRESSION: 1. Aortic atherosclerosis. 2. Calcified plaque at the left carotid bifurcation and ICA bulb. Minimal diameter in the ICA bulb less than 1 mm. Near occlusion. Vessel regains a more normal caliber at the distal bulb and is patent through the cervical region to the skull base beyond that. This is of particular importance in this patient as the left internal carotid artery supplies the majority of the brain, as the right ICA essentially gives isolated supply to the right middle cerebral artery territory only. Urgent vascular surgery consultation suggested. 3. No intracranial large or medium vessel occlusion or correctable proximal  stenosis. Electronically Signed   By: Paulina Fusi M.D.   On: 02/25/2023 15:13    Vitals:   02/25/23 2111 02/26/23 0710 02/26/23 0830 02/26/23 1143  BP: (!) 164/89 (!) 174/94 (!) 172/101 (!) 158/107  Pulse: 64 76 79 77  Resp: 16 15 20 19   Temp: 97.8 F (36.6 C) 98 F (36.7 C)  98.2 F (36.8 C)  TempSrc: Oral Oral  Oral  SpO2: 96% 99% 93% 92%     PHYSICAL EXAM General:  Alert, well-nourished, well-developed patient in no acute distress Psych:  Mood and affect appropriate for situation Respiratory:  Regular, unlabored respirations on room air  NEURO:  Mental Status: AA&Ox3, patient is able to give clear and coherent history Speech/Language: speech is without dysarthria or aphasia.    Cranial Nerves:  II: Left pupil larger than right, both reactive to light (patient is status post cataract surgery) III, IV, VI: EOMI. Eyelids elevate symmetrically.  V: Sensation is intact to light touch and symmetrical to face.  VII: Subtle right facial droop VIII: hearing intact to voice. IX, X: Phonation is normal.  XII: tongue is midline without fasciculations. Motor: Able to move all 4 extremities with good antigravity strength but diminished fine finger movements on the right, left arm orbits right and slight weakness of right hand grip.  Some drift noted in right leg with increased tone Tone: is increased in the right lower extremity Sensation- Intact to light touch bilaterally.  Coordination: FTN intact bilaterally Gait- deferred  Most Recent NIH  1a Level of Conscious.: 0 1b LOC Questions: 0 1c LOC Commands: 0 2 Best Gaze: 0 3 Visual: 0 4 Facial Palsy: 1 5a Motor Arm - left: 0 5b Motor Arm - Right: 0 6a Motor Leg - Left: 0 6b Motor Leg - Right: 1 7 Limb Ataxia: 0 8 Sensory: 0 9 Best Language: 0 10 Dysarthria: 0 11 Extinct. and Inatten.: 0 TOTAL: 2   ASSESSMENT/PLAN  [name] is a 83 y.o. female with history of hypertension, GERD, hide diabetes and  hyperlipidemia admitted for incoordination and weakness of the right side of sudden onset.  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  She also has severe left ICA stenosis and will have an angiogram with possible treatment tomorrow.  NIH on Admission 3  Acute Ischemic Infarct:  left brain infarct Etiology: Likely small vessel disease..  With likely asymptomatic high-grade proximal left ICA   stenosis CT head age-indeterminate superior cerebellar infarct CTA  head & neck calcified plaque at left carotid bifurcation and left ICA bulb with near occlusion of left ICA bulb MRI acute infarct in the left midbrain and left cerebral peduncle, old small vessel infarction and cerebral pons, mild small vessel ischemic changes Patient will need follow-up MRI in 3 months given unusual appearance of stroke 2D Echo EF 55 to 60%, mild LVH of the septal segment, grade 1 diastolic dysfunction, normal left atrial size, normal atrial septum with no evidence of interatrial shunt LDL 81 HgbA1c 6.6 VTE prophylaxis -Lovenox aspirin 81 mg daily prior to admission, now on aspirin 81 mg daily and clopidogrel 75 mg daily for months weeks and then Plavix alone. Therapy recommendations:  CIR Disposition: Pending  Severe left ICA stenosis Near occlusion of left ICA seen on CT angiogram Will perform angiogram with intention to treat tomorrow  Hypertension Home meds: Carvedilol 6.25 mg twice daily, lisinopril 10 mg daily Stable Blood Pressure Goal: BP less than 220/110   Hyperlipidemia Home meds: Rosuvastatin 10 mg daily, increased to 20 LDL 81, goal < 70 Continue statin at discharge  Diabetes type II Controlled Home meds: Mounjaro 5 mg weekly HgbA1c 6.6, goal < 7.0 CBGs SSI Recommend close follow-up with PCP for better DM control  Dysphagia Patient has post-stroke dysphagia, SLP consulted    Diet   DIET DYS 3 Room service appropriate? Yes; Fluid consistency: Nectar Thick   Diet NPO time  specified Except for: Sips with Meds   Advance diet as tolerated  Other Stroke Risk Factors Obesity, There is no height or weight on file to calculate BMI., BMI >/= 30 associated with increased stroke risk, recommend weight loss, diet and exercise as appropriate    Other Active Problems None  Hospital day # 1  Patient seen by NP with MD, MD to edit note as needed. Cortney E Ernestina Columbia , MSN, AGACNP-BC [facility] See Amion for schedule and pager information 02/26/2023 1:27 PM   STROKE MD NOTE :  I have personally obtained history,examined this patient, reviewed notes, independently viewed imaging studies, participated in medical decision making and plan of care.ROS completed by me personally and pertinent positives fully documented  I have made any additions or clarifications directly to the above note. Agree with note above.  Patient presented with sudden onset of right hemiataxia and weakness slight pupillary asymmetry secondary to left midbrain infarct etiology likely small vessel disease .infarct distribution is unusual for a vascular territory and may need to consider follow-up MRI scan of the brain with and without contrast if she does not show significant improvement.   CT angiogram shows heavily calcified proximal left ICA high-grade stenosis.  Recommended further evaluation by checking diagnostic cerebral catheter angiogram to consider possible treatment option for revascularization for this later.  Continue dual antiplatelet therapy for 3 weeks and then Plavix alone and aggressive risk factor modification. Mobilize out of bed.  Therapy consults.  Long discussion with patient and family at bedside and answered questions.  Discussed with Dr. Corliss Skains Greater than 50% time during this 50-minute visit was spent on counseling and coordination of care about her stroke and asymptomatic high-grade carotid stenosis and discussion about stroke risk and answering questions Delia Heady, MD Medical Director [facility] Pager: [phone] 02/26/2023 2:09 PM   To contact Stroke Continuity provider, please refer to [email or web address]. After hours, contact General Neurology

## 2023-02-26 NOTE — Telephone Encounter (Signed)
Requested medication (s) are due for refill today: routing for review  Requested medication (s) are on the active medication list: yes  Last refill:  10/30/22  Future visit scheduled: yes  Notes to clinic:  Unable to refill per protocol, cannot delegate.      Requested Prescriptions  Pending Prescriptions Disp Refills   ARIPiprazole (ABILIFY) 2 MG tablet [Pharmacy Med Name: ARIPIPRAZOLE 2 MG TABLET] 90 tablet 1    Sig: TAKE 1 TABLET BY MOUTH EVERY DAY     Not Delegated - Psychiatry:  Antipsychotics - Second Generation (Atypical) - aripiprazole Failed - 02/26/2023  8:31 AM      Failed - This refill cannot be delegated      Failed - Last BP in normal range    BP Readings from Last 1 Encounters:  02/26/23 (!) 174/94         Failed - Lipid Panel in normal range within the last 12 months    Cholesterol, Total  Date Value Ref Range Status  02/11/2023 198 100 - 199 mg/dL Final   Cholesterol  Date Value Ref Range Status  02/26/2023 160 0 - 200 mg/dL Final   LDL Chol Calc (NIH)  Date Value Ref Range Status  02/11/2023 98 0 - 99 mg/dL Final   LDL Cholesterol  Date Value Ref Range Status  02/26/2023 UNABLE TO CALCULATE IF TRIGLYCERIDE OVER 400 mg/dL 0 - 99 mg/dL Final    Comment:           Total Cholesterol/HDL:CHD Risk Coronary Heart Disease Risk Table                     Men   Women  1/2 Average Risk   3.4   3.3  Average Risk       5.0   4.4  2 X Average Risk   9.6   7.1  3 X Average Risk  23.4   11.0        Use the calculated Patient Ratio above and the CHD Risk Table to determine the patient's CHD Risk.        ATP III CLASSIFICATION (LDL):  <100     mg/dL   Optimal  213-086  mg/dL   Near or Above                    Optimal  130-159  mg/dL   Borderline  578-469  mg/dL   High  >629     mg/dL   Very High Performed at [facility], [address], [place], [place] [address]    Direct LDL  Date Value Ref Range Status  02/26/2023 81 0 - 99 mg/dL Final     Comment:    Performed at [facility], [address], [place], [place] [address]   HDL  Date Value Ref Range Status  02/26/2023 29 (L) >40 mg/dL Final  11/11/2534 39 (L) >39 mg/dL Final   Triglycerides  Date Value Ref Range Status  02/26/2023 442 (H) <150 mg/dL Final         Failed - CMP within normal limits and completed in the last 12 months    Albumin  Date Value Ref Range Status  02/25/2023 4.5 3.5 - 5.0 g/dL Final  64/40/3474 4.7 3.7 - 4.7 g/dL Final   Alkaline Phosphatase  Date Value Ref Range Status  02/25/2023 71 38 - 126 U/L Final   ALT  Date Value Ref Range Status  02/25/2023 40 0 - 44 U/L Final   AST  Date Value Ref Range Status  02/25/2023 34 15 - 41 U/L Final   BUN  Date Value Ref Range Status  02/26/2023 10 8 - 23 mg/dL Final  16/10/9602 18 8 - 27 mg/dL Final   Calcium  Date Value Ref Range Status  02/26/2023 9.6 8.9 - 10.3 mg/dL Final   CO2  Date Value Ref Range Status  02/26/2023 24 22 - 32 mmol/L Final   Creatinine, Ser  Date Value Ref Range Status  02/26/2023 0.91 0.44 - 1.00 mg/dL Final   Glucose, Bld  Date Value Ref Range Status  02/26/2023 107 (H) 70 - 99 mg/dL Final    Comment:    Glucose reference range applies only to samples taken after fasting for at least 8 hours.   Glucose-Capillary  Date Value Ref Range Status  02/26/2023 117 (H) 70 - 99 mg/dL Final    Comment:    Glucose reference range applies only to samples taken after fasting for at least 8 hours.   Potassium  Date Value Ref Range Status  02/26/2023 3.6 3.5 - 5.1 mmol/L Final   Sodium  Date Value Ref Range Status  02/26/2023 141 135 - 145 mmol/L Final  02/11/2023 139 134 - 144 mmol/L Final   Total Bilirubin  Date Value Ref Range Status  02/25/2023 0.6 0.0 - 1.2 mg/dL Final   Bilirubin Total  Date Value Ref Range Status  02/11/2023 0.4 0.0 - 1.2 mg/dL Final   Protein, ur  Date Value Ref Range Status  02/25/2023 NEGATIVE NEGATIVE mg/dL Final    Total Protein  Date Value Ref Range Status  02/25/2023 7.3 6.5 - 8.1 g/dL Final  54/09/8117 7.0 6.0 - 8.5 g/dL Final   GFR calc Af Amer  Date Value Ref Range Status  08/05/2019 55 (L) >59 mL/min/1.73 Final    Comment:    **[facility] currently reports eGFR in compliance with the current**   recommendations of the [employer]. [facility] will   update reporting as new guidelines are published from the NKF-ASN   Task force.    eGFR  Date Value Ref Range Status  02/11/2023 44 (L) >59 mL/min/1.73 Final   GFR, Estimated  Date Value Ref Range Status  02/26/2023 >60 >60 mL/min Final    Comment:    (NOTE) Calculated using the CKD-EPI Creatinine Equation (2021)          Passed - TSH in normal range and within 360 days    TSH  Date Value Ref Range Status  02/25/2023 3.325 0.350 - 4.500 uIU/mL Final    Comment:    Performed by a 3rd Generation assay with a functional sensitivity of <=0.01 uIU/mL. Performed at [facility], [address] [place], [place] [address]   02/11/2023 3.750 0.450 - 4.500 uIU/mL Final         Passed - Completed PHQ-2 or PHQ-9 in the last 360 days      Passed - Last Heart Rate in normal range    Pulse Readings from Last 1 Encounters:  02/26/23 76         Passed - Valid encounter within last 6 months    Recent Outpatient Visits           2 weeks ago Hypertension associated with diabetes [facility])   [place] [facility] Erasmo Downer, MD   9 months ago Hypertension associated with diabetes [facility])   [facility] [place] [facility] Alfredia Ferguson, PA-C   10 months ago Hypertension associated with diabetes [facility])   [place] [facility] [place], Marzella Schlein, MD   1 year ago Hypertension associated with diabetes [facility])   [place] [facility] Caro Laroche, DO   1 year ago Community acquired pneumonia of left lung, unspecified part of lung   [place] [facility] [place], Marzella Schlein, MD       Future Appointments             In 2 weeks Salena Saner, MD [place] [facility] [place]   In 2 months W[place], Gavin Pound, NP  [facility] at [place]   In 2 months Bacigalupo, Marzella Schlein, MD [facility], [facility]            Passed - CBC within normal limits and completed in the last 12 months    WBC  Date Value Ref Range Status  02/26/2023 8.9 4.0 - 10.5 K/uL Final   RBC  Date Value Ref Range Status  02/26/2023 4.27 3.87 - 5.11 MIL/uL Final   Hemoglobin  Date Value Ref Range Status  02/26/2023 12.5 12.0 - 15.0 g/dL Final  09/81/1914 78.2 (L) 11.1 - 15.9 g/dL Final   HCT  Date Value Ref Range Status  02/26/2023 37.8 36.0 - 46.0 % Final   Hematocrit  Date Value Ref Range Status  09/17/2018 33.0 (L) 34.0 - 46.6 % Final   MCHC  Date Value Ref Range Status  02/26/2023 33.1 30.0 - 36.0 g/dL Final   [facility]  Date Value Ref Range Status  02/26/2023 29.3 26.0 - 34.0 pg Final   MCV  Date Value Ref Range Status  02/26/2023 88.5 80.0 - 100.0 fL Final  09/17/2018 78 (L) 79 - 97 fL Final   No results found for: "PLTCOUNTKUC", "LABPLAT", "POCPLA" RDW  Date Value Ref Range Status  02/26/2023 15.3 11.5 - 15.5 % Final  09/17/2018 16.5 (H) 11.7 - 15.4 % Final         Signed Prescriptions Disp Refills   Venlafaxine HCl 225 MG TB24 90 tablet 0    Sig: TAKE 1 TABLET (225 MG TOTAL) BY MOUTH DAILY. PLEASE SCHEDULE OFFICE VISIT BEFORE ANY FUTURE REFILLS     Psychiatry: Antidepressants - SNRI - desvenlafaxine & venlafaxine Failed - 02/26/2023  8:31 AM      Failed - Last BP in normal range    BP Readings from Last 1 Encounters:  02/26/23 (!) 174/94         Failed - Lipid Panel in normal range within the last 12 months    Cholesterol, Total  Date Value Ref Range Status  02/11/2023 198 100 - 199 mg/dL Final   Cholesterol  Date Value Ref Range Status  02/26/2023 160 0 -  200 mg/dL Final   LDL Chol Calc (NIH)  Date Value Ref Range Status  02/11/2023 98 0 - 99 mg/dL Final   LDL Cholesterol  Date Value Ref Range Status  02/26/2023 UNABLE TO CALCULATE IF TRIGLYCERIDE OVER 400 mg/dL 0 - 99 mg/dL Final    Comment:           Total Cholesterol/HDL:CHD Risk Coronary Heart Disease Risk Table                     Men   Women  1/2 Average Risk   3.4   3.3  Average Risk  5.0   4.4  2 X Average Risk   9.6   7.1  3 X Average Risk  23.4   11.0        Use the calculated Patient Ratio above and the CHD Risk Table to determine the patient's CHD Risk.        ATP III CLASSIFICATION (LDL):  <100     mg/dL   Optimal  161-096  mg/dL   Near or Above                    Optimal  130-159  mg/dL   Borderline  045-409  mg/dL   High  >811     mg/dL   Very High Performed at [facility], [address] [place], [place] [address]    Direct LDL  Date Value Ref Range Status  02/26/2023 81 0 - 99 mg/dL Final    Comment:    Performed at [facility], [address], [place], [place] [address]   HDL  Date Value Ref Range Status  02/26/2023 29 (L) >40 mg/dL Final  13/08/6576 39 (L) >39 mg/dL Final   Triglycerides  Date Value Ref Range Status  02/26/2023 442 (H) <150 mg/dL Final         Passed - Cr in normal range and within 360 days    Creatinine, Ser  Date Value Ref Range Status  02/26/2023 0.91 0.44 - 1.00 mg/dL Final         Passed - Completed PHQ-2 or PHQ-9 in the last 360 days      Passed - Valid encounter within last 6 months    Recent Outpatient Visits           2 weeks ago Hypertension associated with diabetes [facility])   [facility] [place], Marzella Schlein, MD   9 months ago Hypertension associated with diabetes [facility])   [place] [facility] Alfredia Ferguson, PA-C   10 months ago Hypertension associated with diabetes [facility])   [place] [facility] [place], Marzella Schlein, MD   1  year ago Hypertension associated with diabetes [facility]   [place] [facility], Darl Householder, DO   1 year ago Community acquired pneumonia of left lung, unspecified part of lung   [place] [facility] [place], Marzella Schlein, MD       Future Appointments             In 2 weeks Salena Saner, MD [place] [facility] [place]   In 2 months Wittenborn, Gavin Pound, NP [place] [facility] [place]   In 2 months Bacigalupo, Marzella Schlein, MD [facility], [facility]             famotidine (PEPCID) 20 MG tablet 180 tablet 0    Sig: TAKE 1 TABLET BY MOUTH TWICE A DAY     Gastroenterology:  H2 Antagonists Passed - 02/26/2023  8:31 AM      Passed - Valid encounter within last 12 months    Recent Outpatient Visits           2 weeks ago Hypertension associated with diabetes [facility])   [place] [facility] [place], Marzella Schlein, MD   9 months ago Hypertension associated with diabetes [facility])   [place] [facility] Alfredia Ferguson, PA-C   10 months ago Hypertension associated with diabetes [facility])   [place] [facility] Bacigalupo, Marzella Schlein, MD   1 year ago Hypertension associated with diabetes [facility])   [place] [facility] [place], Darl Householder, DO   1 year ago Community acquired pneumonia of left lung, unspecified part of lung   [place] [facility] [place], Marzella Schlein, MD       Future Appointments             In 2 weeks Salena Saner, MD [place] [facility] [place]   In 2 months Wittenborn, Gavin Pound, NP [place] [facility] [place]   In 2 months Bacigalupo, Marzella Schlein, MD [facility], [facility]             rosuvastatin (CRESTOR) 10 MG tablet 90 tablet 0    Sig: TAKE 1 TABLET BY MOUTH EVERY DAY     Cardiovascular:  Antilipid - Statins 2 Failed - 02/26/2023  8:31 AM       Failed - Lipid Panel in normal range within the last 12 months    Cholesterol, Total  Date Value Ref Range Status  02/11/2023 198 100 - 199 mg/dL Final   Cholesterol  Date Value Ref Range Status  02/26/2023 160 0 - 200 mg/dL Final   LDL Chol Calc (NIH)  Date Value Ref Range Status  02/11/2023 98 0 - 99 mg/dL Final   LDL Cholesterol  Date Value Ref Range Status  02/26/2023 UNABLE TO CALCULATE IF TRIGLYCERIDE OVER 400 mg/dL 0 - 99 mg/dL Final    Comment:           Total Cholesterol/HDL:CHD Risk Coronary Heart Disease Risk Table                     Men   Women  1/2 Average Risk   3.4   3.3  Average Risk       5.0   4.4  2 X Average Risk   9.6   7.1  3 X Average Risk  23.4   11.0        Use the calculated Patient Ratio above and the CHD Risk Table to determine the patient's CHD Risk.        ATP III CLASSIFICATION (LDL):  <100     mg/dL   Optimal  295-621  mg/dL   Near or Above                    Optimal  130-159  mg/dL   Borderline  308-657  mg/dL   High  >846     mg/dL   Very High Performed at [facility], [address] [place], [place] [address]    Direct LDL  Date Value Ref Range Status  02/26/2023 81 0 - 99 mg/dL Final    Comment:    Performed at [facility], [address], [place], [place] [address]   HDL  Date Value Ref Range Status  02/26/2023 29 (L) >40 mg/dL Final  24/40/1027 39 (L) >39 mg/dL Final   Triglycerides  Date Value Ref Range Status  02/26/2023 442 (H) <150 mg/dL Final         Passed - Cr in normal range and within 360 days    Creatinine, Ser  Date Value Ref Range Status  02/26/2023 0.91 0.44 - 1.00 mg/dL Final         Passed - Patient is not pregnant      Passed - Valid encounter  within last 12 months    Recent Outpatient Visits           2 weeks ago Hypertension associated with diabetes [facility])   [place] [facility] [place], Marzella Schlein, MD   9 months ago Hypertension associated with diabetes  [facility])   [place] [facility] Alfredia Ferguson, PA-C   10 months ago Hypertension associated with diabetes [facility])   [place] [place], Marzella Schlein, MD   1 year ago Hypertension associated with diabetes [facility])   [place] [facility] Caro Laroche, DO   1 year ago Community acquired pneumonia of left lung, unspecified part of lung   [place] [facility] [place], Marzella Schlein, MD       Future Appointments             In 2 weeks Salena Saner, MD [place] [facility] [place]   In 2 months Carlos Levering, NP [place] [facility] [place]   In 2 months Bacigalupo, Marzella Schlein, MD [facility], [facility]            Refused Prescriptions Disp Refills   lisinopril (ZESTRIL) 5 MG tablet [Pharmacy Med Name: LISINOPRIL 5 MG TABLET] 90 tablet 1    Sig: TAKE 1 TABLET (5 MG TOTAL) BY MOUTH DAILY.     Cardiovascular:  ACE Inhibitors Failed - 02/26/2023  8:31 AM      Failed - Last BP in normal range    BP Readings from Last 1 Encounters:  02/26/23 (!) 174/94         Passed - Cr in normal range and within 180 days    Creatinine, Ser  Date Value Ref Range Status  02/26/2023 0.91 0.44 - 1.00 mg/dL Final         Passed - K in normal range and within 180 days    Potassium  Date Value Ref Range Status  02/26/2023 3.6 3.5 - 5.1 mmol/L Final         Passed - Patient is not pregnant      Passed - Valid encounter within last 6 months    Recent Outpatient Visits           2 weeks ago Hypertension associated with diabetes [facility])   [place] [facility] [place], Marzella Schlein, MD   9 months ago Hypertension associated with diabetes [facility])   [place] [facility] Alfredia Ferguson, PA-C   10 months ago Hypertension associated with diabetes [facility])   [place] [facility] [place], Marzella Schlein, MD   1 year ago  Hypertension associated with diabetes [facility])   [place] [facility] Caro Laroche, DO   1 year ago Community acquired pneumonia of left lung, unspecified part of lung   [facility] [place], Marzella Schlein, MD       Future Appointments             In 2 weeks Salena Saner, MD [facility] [place] [facility] [place]   In 2 months Wittenborn, Gavin Pound, NP  [facility] at [place]   In 2 months Bacigalupo, Marzella Schlein, MD [facility], [facility]

## 2023-02-26 NOTE — Progress Notes (Signed)
  Inpatient Rehabilitation Admissions Coordinator   Met with patient and son at bedside for rehab assessment. We discussed goals and expectations of a possible CIR admit. They prefer CIR for rehab. Patient from [facility] and felt that she can reach Mod I to return their after a CIR admit. I will ask Rehab MD to complete a consult and begin Auth with [facility] Medicare on 2/12. Please call me with any questions.   Ottie Glazier, RN, MSN Rehab Admissions Coordinator [phone]

## 2023-02-26 NOTE — Consult Note (Cosign Needed Addendum)
Chief Complaint: Patient was seen in consultation today for  right sided weakness, L ICA stenosis at the request of Natasha Vasquez, Natasha Vasquez (neuro)  Referring Physician(s): Natasha Vasquez, Natasha Vasquez (neuro)  Supervising Physician: Natasha Vasquez  Patient Status: [facility] - In-pt  History of Present Illness: Natasha Vasquez is a 83 y.o. 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 NIR was consulted for possible eval and interventions.   Patient presented to 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 Neurology was consulted who recommended NIR eval, Dr. Loni Beckwith was contacted by neurology who recommended transfer the patient to [facility] for possible NIR eval and interventions. NIR planning for diagnostic cerebral angiogram tomorrow.   Patient laying in bed, not in acute distress. Son a bedside.  Reports chronic SOB, on 1L O2 at night.  Natasha Vasquez headache, vision change, fever, chills, cough, chest pain, abdominal pain, nausea ,vomiting, and bleeding.   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  ER/PR NEGATIVE   Cataract    Chronic cough    Complication of anesthesia    20 years ago pts b/p dropped really low, "coded"   Depression    Depression    Diabetes mellitus without complication (HCC)    diet controlled   Family history of adverse reaction to anesthesia    son PONV   GERD (gastroesophageal reflux disease)    Gout    Hypercholesteremia    Hyperlipidemia    Hypertension     Melanoma (HCC)    MVP (mitral valve prolapse)    followed by PCP   Nephritis    Nephritis    S/P appendectomy    Thoracic aortic aneurysm (HCC)    Torn meniscus    left    Past Surgical History:  Procedure Laterality Date   ABDOMINAL HYSTERECTOMY     APPENDECTOMY     BREAST BIOPSY Right 04/25/2017   retroareolar 10:00   wing clip   path pending   BREAST BIOPSY Right 04/25/2017   10:00  5CMFN  venus clip    path pending   BREAST BIOPSY Right 05/03/2017   Affirm Bx- path pending   BREAST CYST ASPIRATION Bilateral    NEG   BREAST EXCISIONAL BIOPSY Left 20+ yrs ago   NEG   BREAST RECONSTRUCTION WITH PLACEMENT OF TISSUE EXPANDER AND FLEX HD (ACELLULAR HYDRATED DERMIS) Right 06/03/2017   Procedure: BREAST RECONSTRUCTION WITH PLACEMENT OF TISSUE EXPANDER AND FLEX HD (ACELLULAR HYDRATED DERMIS);  Surgeon: Peggye Form, DO;  Location: [facility] ORS;  Service: Plastics;  Laterality: Right;   BREAST REDUCTION SURGERY Left 11/06/2017   Procedure: BREAST REDUCTION;  Surgeon: Peggye Form, DO;  Location: [facility] ORS;  Service: Plastics;  Laterality: Left;   BROW LIFT Bilateral 02/01/2015   Procedure: BLEPHAROPLASTY bilateral upper eyelids.;  Surgeon: Imagene Riches, MD;  Location: [facility];  Service: Ophthalmology;  Laterality: Bilateral;  DIABETIC - diet controlled   CATARACT EXTRACTION W/PHACO Left 01/14/2019   Procedure: CATARACT  EXTRACTION PHACO AND INTRAOCULAR LENS PLACEMENT (IOC) LEFT TECNIS TORIC ADD 5.62 00:46.5 30.0%;  Surgeon: Lockie Mola, MD;  Location: [facility];  Service: Ophthalmology;  Laterality: Left;  diabetic - diet controlled   CATARACT EXTRACTION W/PHACO Right 02/11/2019   Procedure: CATARACT EXTRACTION PHACO AND INTRAOCULAR LENS PLACEMENT (IOC) RIGHT DIABETIC 4.82 00:46.9 10.3%;  Surgeon: Lockie Mola, MD;  Location: [facility];  Service: Ophthalmology;  Laterality: Right;  Diabetic - diet controlled   CHOLECYSTECTOMY      COLONOSCOPY     COLONOSCOPY WITH PROPOFOL N/A 05/11/2015   Procedure: COLONOSCOPY WITH PROPOFOL;  Surgeon: Scot Jun, MD;  Location: [facility] ENDOSCOPY;  Service: Endoscopy;  Laterality: N/A;   COLONOSCOPY WITH PROPOFOL N/A 09/07/2020   Procedure: COLONOSCOPY WITH PROPOFOL;  Surgeon: Toledo, Boykin Nearing, MD;  Location: [facility] ENDOSCOPY;  Service: Gastroenterology;  Laterality: N/A;   ESOPHAGOGASTRODUODENOSCOPY     ESOPHAGOGASTRODUODENOSCOPY (EGD) WITH PROPOFOL  05/11/2015   Procedure: ESOPHAGOGASTRODUODENOSCOPY (EGD) WITH PROPOFOL;  Surgeon: Scot Jun, MD;  Location: [facility] END[facility];  Service: Endoscopy;;   FRACTURE SURGERY Right    arm and shoulder   JOINT REPLACEMENT     KNEE ARTHROSCOPY Left 03/14/2016   Procedure: ARTHROSCOPY KNEE, PARTIAL MEDIAL MENISECTOMY, CHONDROPLASTY;  Surgeon: Donato Heinz, MD;  Location: [facility] ORS;  Service: Orthopedics;  Laterality: Left;   MASTECTOMY Right 2019   MASTECTOMY W/ SENTINEL NODE BIOPSY Right 06/03/2017   Procedure: MASTECTOMY WITH SENTINEL LYMPH NODE BIOPSY;  Surgeon: Earline Mayotte, MD;  Location: [facility] ORS;  Service: General;  Laterality: Right;   PARTIAL HIP ARTHROPLASTY Right    REDUCTION MAMMAPLASTY Left 2019   REMOVAL OF BILATERAL TISSUE EXPANDERS WITH PLACEMENT OF BILATERAL BREAST IMPLANTS Right 11/06/2017   Procedure: REMOVAL OF TISSUE EXPANDER WITH PLACEMENT OF BREAST IMPLANT;  Surgeon: Peggye Form, DO;  Location: [facility] ORS;  Service: Plastics;  Laterality: Right;   TONSILLECTOMY      Allergies: Codeine, Lorazepam, Oxycodone, Paregoric, Penicillins, Quinolones, and Aleve [naproxen sodium]  Medications: Prior to Admission medications   Medication Sig Start Date End Date Taking? Authorizing Provider  acetaminophen (TYLENOL) 500 MG tablet Take 500 mg by mouth every 6 (six) hours as needed.    [provider]  albuterol (VENTOLIN HFA) 108 (90 Base) MCG/ACT inhaler TAKE 2 PUFFS BY MOUTH EVERY 6 HOURS AS NEEDED FOR  WHEEZE OR SHORTNESS OF BREATH 05/29/21   Salena Saner, MD  allopurinol (ZYLOPRIM) 100 MG tablet TAKE 2 TABLETS BY MOUTH EVERY DAY 10/25/22   Erasmo Downer, MD  ARIPiprazole (ABILIFY) 2 MG tablet TAKE 1 TABLET BY MOUTH EVERY DAY 10/30/22   Erasmo Downer, MD  aspirin 81 MG tablet Take 81 mg by mouth daily.    [provider]  bismuth subsalicylate (PEPTO BISMOL) 262 MG chewable tablet Chew 262 mg by mouth as needed. **capsule**    [provider]  calcium carbonate (TUMS - DOSED IN MG ELEMENTAL CALCIUM) 500 MG chewable tablet Chew 1 tablet by mouth as needed for indigestion or heartburn.    [provider]  carboxymethylcellulose (REFRESH PLUS) 0.5 % SOLN Place 1 drop into both eyes 3 (three) times daily as needed (Dry Eyes).    [provider]  carvedilol (COREG) 6.25 MG tablet Take 1 tablet (6.25 mg total) by mouth 2 (two) times daily. 01/29/23   Carlos Levering, NP  Cholecalciferol (VITAMIN D-3 PO) Take 1 tablet by mouth daily.    [provider]  colestipol (COLESTID) 1 g  tablet TAKE 1 TABLET BY MOUTH 2 TIMES DAILY. 10/30/22   Erasmo Downer, MD  famotidine (PEPCID) 20 MG tablet TAKE 1 TABLET BY MOUTH TWICE A DAY 02/26/23   Erasmo Downer, MD  ferrous sulfate 324 MG TBEC Take 324 mg by mouth daily with breakfast.    [provider]  fluocinonide (LIDEX) 0.05 % external solution APPLY TO AFFECTED AREAS OF SCALP DAILY UNTIL CLEAR, THEN FOR FLARES 10/31/18   [provider]  Fluticasone-Umeclidin-Vilant (TRELEGY ELLIPTA) 100-62.5-25 MCG/ACT AEPB Inhale 1 puff into the lungs daily. 08/08/22   Salena Saner, MD  furosemide (LASIX) 20 MG tablet TAKE 1 TABLET BY MOUTH EVERY DAY 06/04/22   Antonieta Iba, MD  lisinopril (ZESTRIL) 10 MG tablet Take 1 tablet (10 mg total) by mouth daily. 05/01/22   Erasmo Downer, MD  Multiple Vitamins-Minerals (CENTRUM SILVER PO) Take 1 tablet by mouth daily.     [provider]  omeprazole (PRILOSEC) 40 MG capsule TAKE 1 CAPSULE BY MOUTH TWICE A DAY 10/25/22   Bacigalupo, Marzella Schlein, MD  oxybutynin (DITROPAN-XL) 10 MG 24 hr tablet TAKE 1 TABLET BY MOUTH EVERY DAY 10/25/22   Bacigalupo, Marzella Schlein, MD  potassium chloride (KLOR-CON) 10 MEQ tablet TAKE 1 TABLET BY MOUTH EVERY DAY 06/04/22   Antonieta Iba, MD  rosuvastatin (CRESTOR) 10 MG tablet TAKE 1 TABLET BY MOUTH EVERY DAY 02/26/23   Erasmo Downer, MD  tirzepatide [facility]) 2.5 MG/0.5ML Pen Inject 2.5 mg into the skin once a week. 02/11/23   Erasmo Downer, MD  tirzepatide [facility]) 5 MG/0.5ML Pen Inject 5 mg into the skin once a week. 02/11/23   Erasmo Downer, MD  Venlafaxine HCl 225 MG TB24 TAKE 1 TABLET (225 MG TOTAL) BY MOUTH DAILY. PLEASE SCHEDULE OFFICE VISIT BEFORE ANY FUTURE REFILLS 02/26/23   Erasmo Downer, MD     Family History  Problem Relation Age of Onset   Cancer Mother        colon/rectal   Colon cancer Mother    Heart disease Father    Hypertension Father    Atrial fibrillation Father    Diabetes Father    Cancer Brother        lung   Healthy Son    Healthy Son    Breast cancer Neg Hx     Social History   Socioeconomic History   Marital status: Married    Spouse name: Dorene Sorrow   Number of children: 2   Years of education: Not on file   Highest education level: Master's degree (e.g., MA, MS, MEng, MEd, MSW, MBA)  Occupational History   Occupation: retired  Tobacco Use   Smoking status: Former    Current packs/day: 0.00    Types: Cigarettes    Start date: 1963    Quit date: 1965    Years since quitting: 60.1   Smokeless tobacco: Never   Tobacco comments:    smoked in college--1 pack would last one week.  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  No smoking; 2 glasses of wine each day.    Social Drivers of Corporate investment banker Strain: Low Risk  (04/23/2022)   Overall Financial Resource Strain (CARDIA)    Difficulty of Paying Living Expenses: Not hard at all  Food Insecurity: No Food Insecurity (04/23/2022)   Hunger Vital Sign    Worried About Running Out of Food in the Last Year: Never true    Ran Out of Food in the Last Year: Never true  Transportation Needs: No Transportation Needs (04/23/2022)   PRAPARE - Administrator, Civil Service (Medical): No    Lack of Transportation (Non-Medical): No  Physical Activity: Sufficiently Active (04/23/2022)   Exercise Vital Sign    Days of Exercise per Week: 5 days    Minutes of Exercise per Session: 40 min  Stress: No Stress Concern Present (04/23/2022)   [employer] of Occupational Health - Occupational Stress Questionnaire    Feeling of Stress : Not at all  Social Connections: Socially Integrated (04/23/2022)   Social Connection and Isolation Panel [NHANES]    Frequency of Communication with Friends and Family: More than three times a week    Frequency of Social Gatherings with Friends and Family: More than three times a week    Attends Religious Services: More than 4 times per year    Active Member of [employer] or Organizations: Yes    Attends Engineer, structural: More than 4 times per year    Marital Status: Married     Review of Systems: A 12 point ROS discussed and pertinent positives are indicated in the HPI above.  All other systems are negative.  Vital Signs: BP (!) 158/107 (BP Location: Left Arm)   Pulse 77   Temp 98.2 F (36.8 C) (Oral)   Resp 19   SpO2 92%    Physical Exam Vitals reviewed.  Constitutional:      General: She is not in acute distress.    Appearance: She is not  ill-appearing.  HENT:     Head: Normocephalic and atraumatic.     Mouth/Throat:     Mouth: Mucous membranes are moist.     Pharynx: Oropharynx is clear.  Eyes:     Extraocular Movements: Extraocular movements intact.     Comments: Right pupil smaller than left, right 3 mm and left 4 mm   Cardiovascular:     Rate and Rhythm: Normal rate and regular rhythm.     Heart sounds: Normal heart sounds.  Pulmonary:     Effort: Pulmonary effort is normal.     Breath sounds: Normal breath sounds.  Abdominal:     General: Abdomen is flat. Bowel sounds are normal.     Palpations: Abdomen is soft.  Musculoskeletal:     Cervical back: Neck supple.  Skin:    General: Skin is warm and dry.     Coloration: Skin is not jaundiced or pale.  Neurological:     Mental Status: She is alert and oriented to person, place, and time.     Comments: Alert, awake, and oriented x3 Speech and comprehension intact EOMs intact, no nystagmus or subjective diplopia. No facial asymmetry. Motor power 5/5 all 4 No pronator drift.   Psychiatric:        Mood and Affect: Mood normal.        Behavior: Behavior normal.        Judgment: Judgment normal.     MD Evaluation Airway: WNL Heart: WNL Abdomen:  WNL Chest/ Lungs: WNL ASA  Classification: 3 Mallampati/Airway Score: Two  Imaging: ECHOCARDIOGRAM COMPLETE Result Date: 02/26/2023    ECHOCARDIOGRAM REPORT   Patient Name:   Natasha Vasquez Date of Exam: 02/26/2023 Medical Rec #:  [id number]         Height:       64.0 in Accession #:    [id number]        Weight:       196.0 lb Date of Birth:  April 27, 1940          BSA:          1.940 m Patient Age:    82 years          BP:           174/94 mmHg Patient Gender: F                 HR:           72 bpm. Exam Location:  Inpatient Procedure: 2D Echo, Cardiac Doppler, Color Doppler, Saline Contrast Bubble Study            and Intracardiac Opacification Agent Indications:    Stroke  History:        Patient has prior history of  Echocardiogram examinations, most                 recent 09/05/2022. Risk Factors:Hypertension, Diabetes,                 Dyslipidemia and Former Smoker.  Sonographer:    Karma Ganja Referring Phys: [id number] PROSPER M AMPONSAH  Sonographer Comments: Technically difficult study due to poor echo windows and patient is obese. IMPRESSIONS  1. Left ventricular ejection fraction, by estimation, is 55 to 60%. The left ventricle has normal function. The left ventricle has no regional wall motion abnormalities. There is mild asymmetric left ventricular hypertrophy of the septal segment. Left ventricular diastolic parameters are consistent with Grade I diastolic dysfunction (impaired relaxation).  2. Right ventricular systolic function is normal. The right ventricular size is normal. There is normal pulmonary artery systolic pressure. The estimated right ventricular systolic pressure is 30.9 mmHg.  3. The mitral valve is grossly normal. No evidence of mitral valve regurgitation. No evidence of mitral stenosis.  4. The aortic valve is tricuspid. There is mild calcification of the aortic valve. There is mild thickening of the aortic valve. Aortic valve regurgitation is not visualized. Aortic valve sclerosis is present, with no evidence of aortic valve stenosis.  5. The inferior vena cava is normal in size with greater than 50% respiratory variability, suggesting right atrial pressure of 3 mmHg.  6. Agitated saline contrast bubble study was negative, with no evidence of any interatrial shunt. Comparison(s): No significant change from prior study. Conclusion(s)/Recommendation(s): No intracardiac source of embolism detected on this transthoracic study. Consider a transesophageal echocardiogram to exclude cardiac source of embolism if clinically indicated. FINDINGS  Left Ventricle: Left ventricular ejection fraction, by estimation, is 55 to 60%. The left ventricle has normal function. The left ventricle has no regional wall motion  abnormalities. Definity contrast agent was given IV to delineate the left ventricular  endocardial borders. The left ventricular internal cavity size was normal in size. There is mild asymmetric left ventricular hypertrophy of the septal segment. Left ventricular diastolic parameters are consistent with Grade I diastolic dysfunction (impaired relaxation). Right Ventricle: The right ventricular size is normal. No increase in right ventricular wall thickness. Right ventricular systolic function is normal.  There is normal pulmonary artery systolic pressure. The tricuspid regurgitant velocity is 2.64 m/s, and  with an assumed right atrial pressure of 3 mmHg, the estimated right ventricular systolic pressure is 30.9 mmHg. Left Atrium: Left atrial size was normal in size. Right Atrium: Right atrial size was normal in size. Pericardium: There is no evidence of pericardial effusion. Presence of epicardial fat layer. Mitral Valve: The mitral valve is grossly normal. No evidence of mitral valve regurgitation. No evidence of mitral valve stenosis. Tricuspid Valve: The tricuspid valve is grossly normal. Tricuspid valve regurgitation is trivial. No evidence of tricuspid stenosis. Aortic Valve: The aortic valve is tricuspid. There is mild calcification of the aortic valve. There is mild thickening of the aortic valve. Aortic valve regurgitation is not visualized. Aortic valve sclerosis is present, with no evidence of aortic valve stenosis. Aortic valve mean gradient measures 6.0 mmHg. Aortic valve peak gradient measures 10.9 mmHg. Aortic valve area, by VTI measures 2.64 cm. Pulmonic Valve: The pulmonic valve was grossly normal. Pulmonic valve regurgitation is not visualized. No evidence of pulmonic stenosis. Aorta: The aortic root is normal in size and structure. Venous: The inferior vena cava is normal in size with greater than 50% respiratory variability, suggesting right atrial pressure of 3 mmHg. IAS/Shunts: The atrial  septum is grossly normal. Agitated saline contrast was given intravenously to evaluate for intracardiac shunting. Agitated saline contrast bubble study was negative, with no evidence of any interatrial shunt.  LEFT VENTRICLE PLAX 2D LVIDd:         4.10 cm     Diastology LVIDs:         2.70 cm     LV e' medial:    5.77 cm/s LV PW:         0.90 cm     LV E/e' medial:  12.9 LV IVS:        1.20 cm     LV e' lateral:   9.68 cm/s LVOT diam:     2.10 cm     LV E/e' lateral: 7.7 LV SV:         81 LV SV Index:   42 LVOT Area:     3.46 cm  LV Volumes (MOD) LV vol d, MOD A2C: 83.3 ml LV vol d, MOD A4C: 63.6 ml LV vol s, MOD A2C: 30.2 ml LV vol s, MOD A4C: 36.0 ml LV SV MOD A2C:     53.1 ml LV SV MOD A4C:     63.6 ml LV SV MOD BP:      38.7 ml RIGHT VENTRICLE             IVC RV Basal diam:  3.60 cm     IVC diam: 1.60 cm RV S prime:     14.30 cm/s TAPSE (M-mode): 2.6 cm LEFT ATRIUM             Index        RIGHT ATRIUM           Index LA diam:        3.90 cm 2.01 cm/m   RA Area:     17.60 cm LA Vol (A2C):   94.1 ml 48.51 ml/m  RA Volume:   46.90 ml  24.18 ml/m LA Vol (A4C):   43.1 ml 22.22 ml/m LA Biplane Vol: 62.9 ml 32.43 ml/m  AORTIC VALVE AV Area (Vmax):    2.62 cm AV Area (Vmean):   2.65 cm AV Area (VTI):  2.64 cm AV Vmax:           165.00 cm/s AV Vmean:          111.000 cm/s AV VTI:            0.306 m AV Peak Grad:      10.9 mmHg AV Mean Grad:      6.0 mmHg LVOT Vmax:         125.00 cm/s LVOT Vmean:        84.900 cm/s LVOT VTI:          0.233 m LVOT/AV VTI ratio: 0.76  AORTA Ao Root diam: 3.50 cm Ao Asc diam:  3.30 cm MITRAL VALVE               TRICUSPID VALVE MV Area (PHT): 2.93 cm    TR Peak grad:   27.9 mmHg MV Decel Time: 259 msec    TR Vmax:        264.00 cm/s MV E velocity: 74.40 cm/s MV A velocity: 89.60 cm/s  SHUNTS MV E/A ratio:  0.83        Systemic VTI:  0.23 m                            Systemic Diam: 2.10 cm Lennie Odor MD Electronically signed by Lennie Odor MD Signature Date/Time:  02/26/2023/10:56:16 AM    Final    DG HIP UNILAT WITH PELVIS 1V RIGHT Result Date: 02/26/2023 CLINICAL DATA:  83 year old female with history of right-sided hip pain. EXAM: DG HIP (WITH OR WITHOUT PELVIS) 1V RIGHT COMPARISON:  No priors. FINDINGS: AP view of the bony pelvis and AP view of the right hip demonstrate no definite acute displaced fracture of the bony pelvic ring. Bilateral proximal femurs as visualized appear intact. Patient is status post right hip hemiarthroplasty. Prosthetic femoral head projects within the acetabulum on this single view examination. No definite periprosthetic fracture or other acute abnormality. Iodinated contrast material is noted within the lumen of the urinary bladder, presumably related to recent contrast-enhanced CT examinations. IMPRESSION: 1. No acute radiographic abnormality of the bony pelvis or the right hip. 2. Status post right hip hemiarthroplasty. Electronically Signed   By: Trudie Reed M.D.   On: 02/26/2023 06:12   MR BRAIN WO CONTRAST Result Date: 02/25/2023 CLINICAL DATA:  Neuro deficit, acute, stroke suspected. Right-sided weakness. EXAM: MRI HEAD WITHOUT CONTRAST TECHNIQUE: Multiplanar, multiecho pulse sequences of the brain and surrounding structures were obtained without intravenous contrast. COMPARISON:  CT studies same day. FINDINGS: Brain: Diffusion imaging shows acute infarction within the left midbrain and left cerebral peduncle. No other acute infarction. 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 No focal cerebellar insult. Cerebral hemispheres otherwise show mild chronic small-vessel ischemic change of the white matter. No large vessel territory stroke. No mass lesion, hemorrhage, hydrocephalus or extra-axial collection. Vascular: Major vessels at the base of the brain show flow. Skull and upper cervical spine: Negative Sinuses/Orbits: Clear/normal Other: None IMPRESSION: 1. Acute infarction within the left midbrain and left cerebral  peduncle. 2. 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 3. Mild chronic small-vessel ischemic change of the cerebral hemispheric white matter. Electronically Signed   By: Paulina Fusi M.D.   On: 02/25/2023 19:55   CT ANGIO HEAD NECK W WO CM Result Date: 02/25/2023 CLINICAL DATA:  Neuro deficit, acute, stroke suspected. Extremity weakness. Facial droop. Hypertension and diabetes. EXAM: CT ANGIOGRAPHY HEAD AND  NECK WITH AND WITHOUT CONTRAST TECHNIQUE: Multidetector CT imaging of the head and neck was performed using the standard protocol during bolus administration of intravenous contrast. Multiplanar CT image reconstructions and MIPs were obtained to evaluate the vascular anatomy. Carotid stenosis measurements (when applicable) are obtained utilizing NASCET criteria, using the distal internal carotid diameter as the denominator. RADIATION DOSE REDUCTION: This exam was performed according to the departmental dose-optimization program which includes automated exposure control, adjustment of the mA and/or kV according to patient size and/or use of iterative reconstruction technique. CONTRAST:  60mL OMNIPAQUE IOHEXOL 350 MG/ML SOLN COMPARISON:  Head CT earlier same day.  Ultrasound 04/28/2021. FINDINGS: CTA NECK FINDINGS Aortic arch: Aortic atherosclerosis. Branching pattern is normal without origin stenosis. Right carotid system: Common carotid artery widely patent to the bifurcation. No soft or calcified plaque at the bifurcation. Right ICA does not show any focal stenosis but is smaller than the left. Left carotid system: Common carotid artery widely patent to the bifurcation. Calcified plaque at the carotid bifurcation and ICA bulb. Minimal diameter in the ICA bulb less than 1 mm. Near occlusion. Vessel regains a more normal caliber at the distal bulb and is patent through the cervical region to the skull base beyond that. Vertebral arteries: Both vertebral artery origins widely patent. Both vertebral  arteries patent through the cervical region to the foramen magnum. Skeleton: Chronic mid cervical spondylosis. Other neck: No mass or lymphadenopathy. Upper chest: Scarring at the lung apices.  No acute finding. Review of the MIP images confirms the above findings CTA HEAD FINDINGS Anterior circulation: Both internal carotid arteries are patent through the skull base and siphon regions. Ordinary siphon atherosclerotic calcification but no stenosis greater than 30%. The anterior and middle cerebral vessels are patent. Both anterior cerebral arteries receive there supply from the left carotid circulation. Right internal carotid artery supplies only the middle cerebral artery territory. No large vessel occlusion. No proximal stenosis. No aneurysm or vascular malformation. Posterior circulation: Both vertebral arteries patent through the foramen magnum to the basilar artery. No basilar stenosis. Posterior circulation branch vessels are patent. Left PCA receives most of it supply from the anterior circulation. Venous sinuses: Patent and normal. Anatomic variants: None significant otherwise. Review of the MIP images confirms the above findings IMPRESSION: 1. Aortic atherosclerosis. 2. Calcified plaque at the left carotid bifurcation and ICA bulb. Minimal diameter in the ICA bulb less than 1 mm. Near occlusion. Vessel regains a more normal caliber at the distal bulb and is patent through the cervical region to the skull base beyond that. This is of particular importance in this patient as the left internal carotid artery supplies the majority of the brain, as the right ICA essentially gives isolated supply to the right middle cerebral artery territory only. Urgent vascular surgery consultation suggested. 3. No intracranial large or medium vessel occlusion or correctable proximal stenosis. Electronically Signed   By: Paulina Fusi M.D.   On: 02/25/2023 15:13   CT Head Wo Contrast Result Date: 02/25/2023 CLINICAL DATA:   Neuro deficit, acute, stroke suspected EXAM: CT HEAD WITHOUT CONTRAST TECHNIQUE: Contiguous axial images were obtained from the base of the skull through the vertex without intravenous contrast. RADIATION DOSE REDUCTION: This exam was performed according to the departmental dose-optimization program which includes automated exposure control, adjustment of the mA and/or kV according to patient size and/or use of iterative reconstruction technique. COMPARISON:  None Available. FINDINGS: Brain: No hemorrhage. No hydrocephalus. No extra-axial fluid collection. No mass effect. No mass  lesion. 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 Skull: Normal. Negative for fracture or focal lesion. Sinuses/Orbits: No middle ear or mastoid effusion. Paranasal sinuses are clear. Bilateral lens replacement. Orbits are otherwise unremarkable. Other: None. 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 Consider further evaluation with MRI. Electronically Signed   By: Lorenza Cambridge M.D.   On: 02/25/2023 11:53    Labs:  CBC: Recent Labs    09/07/22 1604 02/25/23 1113 02/26/23 0317  WBC 10.4 7.1 8.9  HGB 12.9 12.4 12.5  HCT 40.1 38.4 37.8  PLT 341 301 288    COAGS: Recent Labs    02/25/23 1113  INR 1.0  APTT 28    BMP: Recent Labs    05/18/22 1610 02/11/23 0945 02/25/23 1113 02/26/23 0317  NA 142 139 140 141  K 4.0 4.6 3.8 3.6  CL 102 99 105 104  CO2 24 22 25 24   GLUCOSE 84 127* 135* 107*  BUN 16 18 14 10   CALCIUM 9.8 10.0 9.3 9.6  CREATININE 0.91 1.22* 0.86 0.91  GFRNONAA  --   --  >60 >60    LIVER FUNCTION TESTS: Recent Labs    05/18/22 1610 02/11/23 0945 02/25/23 1113  BILITOT 0.2 0.4 0.6  AST 21 30 34  ALT 21 34* 40  ALKPHOS 102 105 71  PROT 6.8 7.0 7.3  ALBUMIN 4.6 4.7 4.5    TUMOR MARKERS: No results for input(s): "AFPTM", "CEA", "CA199", "CHROMGRNA" in the last 8760  hours.  Assessment and Plan: 83 y.o. female with right sided weakness, L ICA stenosis, she is in need of diagnostic cerebral angiogram. No intervention planned for tomorrow.   VS hypertensive 172/101 CBC wnl BMP wnl  INR 1.0 2/10 On Lovenox 40 mg every day and received one time 300 mg ASA today - discussed with Dr. Corliss Skains, pt to be on DAPT with ASA 81 mg and Plavix 75 mg starting tomorrow. Allergies reviewed   Risks and benefits of cerebral angiogram with intervention were discussed with the patient including, but not limited to bleeding, infection, vascular injury, contrast induced renal failure, stroke or even death.  This interventional procedure involves the use of X-rays and because of the nature of the planned procedure, it is possible that we will have prolonged use of X-ray fluoroscopy.  Potential radiation risks to you include (but are not limited to) the following: - A slightly elevated risk for cancer  several years later in life. This risk is typically less than 0.5% percent. This risk is low in comparison to the normal incidence of human cancer, which is 33% for women and 50% for men according to the [employer]. - Radiation induced injury can include skin redness, resembling a rash, tissue breakdown / ulcers and hair loss (which can be temporary or permanent).   The likelihood of either of these occurring depends on the difficulty of the procedure and whether you are sensitive to radiation due to previous procedures, disease, or genetic conditions.   IF your procedure requires a prolonged use of radiation, you will be notified and given written instructions for further action.  It is your responsibility to monitor the irradiated area for the 2 weeks following the procedure and to notify your physician if you are concerned that you have suffered a radiation induced injury.    All of the patient's questions were answered, patient is agreeable to  proceed.  Consent signed and in  IR.   The procedure is tentatively scheduled for tomorrow pending IR schedule.   PLAN - NPO except meds at MN - ASA 81 mg and Plavix 75 at 8 am tomorrow  - Lovenox held     Thank you for this interesting consult.  I greatly enjoyed meeting Janyce Ellinger and look forward to participating in their care.  A copy of this report was sent to the requesting provider on this date.  Electronically Signed: Willette Brace, PA-C 02/26/2023, 12:11 PM   I spent a total of 40 Minutes    in face to face in clinical consultation, greater than 50% of which was counseling/coordinating care for L ICA stenosis.   This chart was dictated using voice recognition software.  Despite best efforts to proofread,  errors can occur which can change the documentation meaning.

## 2023-02-26 NOTE — Progress Notes (Signed)
  Echocardiogram 2D Echocardiogram has been performed.  Natasha Vasquez Natasha Vasquez 02/26/2023, 9:48 AM

## 2023-02-26 NOTE — Evaluation (Signed)
Occupational Therapy Evaluation Patient Details Name: Natasha Vasquez MRN: [id number] DOB: Feb 02, 1940 Today's Date: 02/26/2023   History of Present Illness   History of Present Illness: Natasha Vasquez is a 83 y.o. female presented to the [facility] ED 2/10 for evaluation of right-sided weakness and balance problems. 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 Patient transferred to [facility] same day for potential arteriogram for 02/26/23. PHMx: HTN, T2DM, HLD, depression, OSA with CPAP, osteoarthritis, ascending thoracic aortic aneurysm, asthma and GERD.     Clinical Impressions This 83 yo female admitted with above presents to acute OT with PLOF of being totally independent with all basic ADLs, IADLs, and driving. Currently she is setup-min A for basic ADLs and mobility. She lives with her husband who has mild dementia in independent living. She will continue to benefit from acute OT with follow up from intensive inpatient follow-up therapy, >3 hours/day.      If plan is discharge home, recommend the following:   A little help with walking and/or transfers;A little help with bathing/dressing/bathroom;Assistance with cooking/housework;Assist for transportation;Help with stairs or ramp for entrance     Functional Status Assessment   Patient has had a recent decline in their functional status and demonstrates the ability to make significant improvements in function in a reasonable and predictable amount of time.     Equipment Recommendations   None recommended by OT     Recommendations for Other Services   Rehab consult     Precautions/Restrictions   Precautions Precautions: Fall Recall of Precautions/Restrictions: Intact Restrictions Weight Bearing Restrictions Per Provider Order: No     Mobility Bed Mobility Overal bed mobility: Modified Independent                  Transfers Overall transfer level: Needs  assistance Equipment used: None Transfers: Bed to chair/wheelchair/BSC Sit to Stand: Contact guard assist     Step pivot transfers: Min assist     General transfer comment: CGA sit<>stand with Min A stand step to recliner from bed with one hand A      Balance Overall balance assessment: Needs assistance Sitting-balance support: No upper extremity supported, Feet supported Sitting balance-Leahy Scale: Good     Standing balance support: Single extremity supported Standing balance-Leahy Scale: Poor                             ADL either performed or assessed with clinical judgement   ADL Overall ADL's : Needs assistance/impaired Eating/Feeding: Sitting;Set up Eating/Feeding Details (indicate cue type and reason): increaesd time and effort due to decreased use of RUE Grooming: Set up;Sitting Grooming Details (indicate cue type and reason): increaesd time and effort due to decreased use of RUE Upper Body Bathing: Set up;Sitting Upper Body Bathing Details (indicate cue type and reason): increaesd time and effort due to decreased use of RUE Lower Body Bathing: Contact guard assist;Sit to/from stand Lower Body Bathing Details (indicate cue type and reason): increaesd time and effort due to decreased use of RUE Upper Body Dressing : Set up;Sitting Upper Body Dressing Details (indicate cue type and reason): increaesd time and effort due to decreased use of RUE Lower Body Dressing: Contact guard assist;Sit to/from stand Lower Body Dressing Details (indicate cue type and reason): increaesd time and effort due to decreased use of RUE; pt does not normally wear socks anytime of the year Toilet Transfer: Minimal assistance;Ambulation Toilet Transfer Details (indicate  cue type and reason): simulated by steps at bedside Toileting- Clothing Manipulation and Hygiene: Minimal assistance Toileting - Clothing Manipulation Details (indicate cue type and reason): increaesd time and effort  due to decreased use of RUE; CGA sit<>stand             Vision Baseline Vision/History: 1 Wears glasses (reading) Ability to See in Adequate Light: 0 Adequate Patient Visual Report: Other (comment) (reports a halo affect when looking at things) Vision Assessment?: Yes Eye Alignment: Within Functional Limits Ocular Range of Motion: Within Functional Limits Alignment/Gaze Preference: Within Defined Limits Tracking/Visual Pursuits: Able to track stimulus in all quads without difficulty Saccades: Additional eye shifts occurred during testing;Overshoots;Undershoots Convergence: Within functional limits Visual Fields: No apparent deficits Additional Comments: Pt reports when symptoms first started that she could not see out of right eye. Today during gross testing she is seeing in her peripheral fields at a normal line of sight area. I did recommend to patient when she does go back to her eye doctor to have them do a full visual field test to see if any vision is missing.            Pertinent Vitals/Pain Pain Assessment Pain Assessment: No/denies pain     Extremity/Trunk Assessment Upper Extremity Assessment Upper Extremity Assessment: Right hand dominant;RUE deficits/detail RUE Deficits / Details: delayed /slower movement than LUE with grossly 3/5 all joints. 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.Slight dysmetria with finger to nose with eyes open--did not try with eyes closed RUE Coordination: decreased fine motor;decreased gross motor         Communication Communication Communication: Other (comment) (occassional slurred speech)   Cognition Arousal: Alert Behavior During Therapy: WFL for tasks assessed/performed Cognition: No apparent impairments                               Following commands: Intact                  Home Living  Family/patient expects to be discharged to:: Private residence Living Arrangements: Spouse/significant other Available Help at Discharge: Family;Available 24 hours/day Type of Home: Independent living facility [facility]) Home Access: Level entry     Home Layout: One level     Bathroom Shower/Tub: Producer, television/film/video: Handicapped height Bathroom Accessibility: Yes   Home Equipment: Cane - single point;Shower seat;Grab bars - tub/shower;Grab bars - toilet;BSC/3in1   Additional Comments: Husband has mild dementia--still able to care for himself and drive      Prior Functioning/Environment Prior Level of Function : Independent/Modified Independent;Driving             Mobility Comments: no use of AD prior to admission ADLs Comments: independent    OT Problem List: Decreased strength;Impaired balance (sitting and/or standing);Decreased coordination;Impaired UE functional use   OT Treatment/Interventions: Self-care/ADL training;DME and/or AE instruction;Balance training;Patient/family education      OT Goals(Current goals can be found in the care plan section)   Acute Rehab OT Goals Patient Stated Goal: to go to rehab before home OT Goal Formulation: With patient/family Time For Goal Achievement: 03/12/23 Potential to Achieve Goals: Good   OT Frequency:  Min 1X/week       AM-PAC OT "6 Clicks" Daily Activity     Outcome Measure Help from another person  eating meals?: A Little Help from another person taking care of personal grooming?: A Little Help from another person toileting, which includes using toliet, bedpan, or urinal?: A Little Help from another person bathing (including washing, rinsing, drying)?: A Little Help from another person to put on and taking off regular upper body clothing?: A Little Help from another person to put on and taking off regular lower body clothing?: A Little 6 Click Score: 18   End of Session    Activity Tolerance:  Patient tolerated treatment well Patient left: in chair (instructed to call if she needed to get up for any reason--pt verbalized understanding)  OT Visit Diagnosis: Unsteadiness on feet (R26.81);Other abnormalities of gait and mobility (R26.89);Muscle weakness (generalized) (M62.81);Hemiplegia and hemiparesis Hemiplegia - Right/Left: Right Hemiplegia - dominant/non-dominant: Dominant Hemiplegia - caused by: Cerebral infarction                Time: 1040-1107 OT Time Calculation (min): 27 min Charges:  OT General Charges $OT Visit: 1 Visit OT Treatments $Self Care/Home Management : 8-22 mins  Lindon Romp OT Acute Rehabilitation Services Office [phone]    Evette Georges 02/26/2023, 12:37 PM

## 2023-02-26 NOTE — Evaluation (Signed)
Physical Therapy Evaluation Patient Details Name: Natasha Vasquez MRN: [id number] DOB: 1940/07/04 Today's Date: 02/26/2023  History of Present Illness  Natasha Vasquez is a 83 y.o. female with medical history significant for HTN, T2DM, HLD, depression, OSA with CPAP, osteoarthritis, ascending thoracic aortic aneurysm, asthma and GERD who presented to the [facility] ED 2/10 for evaluation of right-sided weakness and balance problems.  Patient reports that she woke up around 3 AM and ambulated to use the bathroom.  She went back to sleep and after waking up around 7:30 AM, she noticed some right-sided weakness and off balance while walking.  She felt wobbly on her feet with difficulty with coordination while walking and could tell that she was not talking right. MRI reveals Acute infarction within the left midbrain and left cerebral  peduncle. Patient transferred to [facility] same day for potential arteriogram.   Clinical Impression  Patient received in bed, she is pleasant and agrees to PT assessment. Son, Dorene Sorrow at bedside. Patient is mod I with bed mobility, transfers with min A. She is unsteady without UE support, therefore patient given RW. Patient was able to ambulate to bathroom and back to bed ~ 25 feet with min A. She demonstrates poor coordination, poor foot placement, and narrow base of support, almost scissoring of R LE. She has mild weakness on her right side and reports pain in right hip that comes and goes during session. She will continue to benefit from skilled PT to improve strength, coordination, balance and safety.          If plan is discharge home, recommend the following: A little help with walking and/or transfers;A little help with bathing/dressing/bathroom;Assist for transportation;Help with stairs or ramp for entrance;Assistance with cooking/housework   Can travel by private vehicle    yes    Equipment Recommendations Rolling walker (2 wheels)  Recommendations for Other  Services  Rehab consult    Functional Status Assessment Patient has had a recent decline in their functional status and demonstrates the ability to make significant improvements in function in a reasonable and predictable amount of time.     Precautions / Restrictions Precautions Precautions: Fall Restrictions Weight Bearing Restrictions Per Provider Order: No      Mobility  Bed Mobility Overal bed mobility: Modified Independent                  Transfers Overall transfer level: Needs assistance Equipment used: None, Rolling walker (2 wheels) Transfers: Sit to/from Stand Sit to Stand: Contact guard assist, Min assist           General transfer comment: Required min A to stand from be without AD, cga/min A and cues to stand from bed with RW    Ambulation/Gait Ambulation/Gait assistance: Contact guard assist Gait Distance (Feet): 25 Feet Assistive device: Rolling walker (2 wheels) Gait Pattern/deviations: Step-to pattern, Ataxic, Scissoring, Narrow base of support Gait velocity: decr     General Gait Details: Patient demonstrates poor coordination of R LE, poor foot placement, narrow base of support, almost scissoring of R LE, R foot also wants to roll in.  Stairs            Wheelchair Mobility     Tilt Bed    Modified Rankin (Stroke Patients Only) Modified Rankin (Stroke Patients Only) Pre-Morbid Rankin Score: No symptoms Modified Rankin: Moderately severe disability     Balance Overall balance assessment: Needs assistance Sitting-balance support: Feet supported Sitting balance-Leahy Scale: Good     Standing balance  support: Bilateral upper extremity supported, During functional activity, Reliant on assistive device for balance Standing balance-Leahy Scale: Fair                               Pertinent Vitals/Pain Pain Assessment Pain Assessment: Faces Faces Pain Scale: Hurts a little bit Pain Location: R hip Pain  Descriptors / Indicators: Spasm, Discomfort Pain Intervention(s): Monitored during session, Repositioned    Home Living Family/patient expects to be discharged to:: Private residence Living Arrangements: Spouse/significant other Available Help at Discharge: Family;Available 24 hours/day Type of Home: Independent living facility [facility]) Home Access: Level entry       Home Layout: One level Home Equipment: Cane - single point;Shower seat;Grab bars - tub/shower;Grab bars - toilet;BSC/3in1      Prior Function Prior Level of Function : Independent/Modified Independent;Driving             Mobility Comments: no use of AD prior to admission ADLs Comments: independent     Extremity/Trunk Assessment   Upper Extremity Assessment Upper Extremity Assessment: Defer to OT evaluation    Lower Extremity Assessment Lower Extremity Assessment: RLE deficits/detail RLE Deficits / Details: decreased coordination RLE Sensation: decreased light touch RLE Coordination: decreased gross motor    Cervical / Trunk Assessment Cervical / Trunk Assessment: Normal  Communication   Communication Communication: Impaired;Other (comment) (word finding difficulties, occasional slurred speech)    Cognition Arousal: Alert Behavior During Therapy: WFL for tasks assessed/performed   PT - Cognitive impairments: No apparent impairments                         Following commands: Intact       Cueing Cueing Techniques: Verbal cues     General Comments      Exercises     Assessment/Plan    PT Assessment Patient needs continued PT services  PT Problem List Decreased strength;Decreased range of motion;Decreased activity tolerance;Decreased balance;Decreased mobility;Decreased coordination;Decreased knowledge of use of DME;Decreased safety awareness;Pain;Impaired sensation       PT Treatment Interventions DME instruction;Gait training;Stair training;Functional mobility  training;Therapeutic activities;Therapeutic exercise;Balance training;Neuromuscular re-education;Cognitive remediation;Patient/family education    PT Goals (Current goals can be found in the Care Plan section)  Acute Rehab PT Goals Patient Stated Goal: regain independence PT Goal Formulation: With patient/family Time For Goal Achievement: 03/12/23 Potential to Achieve Goals: Good    Frequency Min 4X/week     Co-evaluation               AM-PAC PT "6 Clicks" Mobility  Outcome Measure Help needed turning from your back to your side while in a flat bed without using bedrails?: None Help needed moving from lying on your back to sitting on the side of a flat bed without using bedrails?: None Help needed moving to and from a bed to a chair (including a wheelchair)?: A Little Help needed standing up from a chair using your arms (e.g., wheelchair or bedside chair)?: A Little Help needed to walk in hospital room?: A Little Help needed climbing 3-5 steps with a railing? : A Lot 6 Click Score: 19    End of Session Equipment Utilized During Treatment: Gait belt Activity Tolerance: Patient tolerated treatment well Patient left: in bed;with call bell/phone within reach;with family/visitor present;with bed alarm set Nurse Communication: Mobility status PT Visit Diagnosis: Unsteadiness on feet (R26.81);Other abnormalities of gait and mobility (R26.89);Muscle weakness (generalized) (M62.81);Ataxic gait (R26.0);Difficulty  in walking, not elsewhere classified (R26.2);Pain;Hemiplegia and hemiparesis Hemiplegia - Right/Left: Right Hemiplegia - dominant/non-dominant: Dominant Hemiplegia - caused by: Cerebral infarction Pain - Right/Left: Right Pain - part of body: Hip    Time: 1011-1051 PT Time Calculation (min) (ACUTE ONLY): 40 min   Charges:   PT Evaluation $PT Eval Moderate Complexity: 1 Mod PT Treatments $Gait Training: 8-22 mins PT General Charges $$ ACUTE PT VISIT: 1 Visit          Verdelle Valtierra, PT, GCS 02/26/23,11:20 AM

## 2023-02-27 ENCOUNTER — Inpatient Hospital Stay (HOSPITAL_COMMUNITY): Payer: Medicare PPO

## 2023-02-27 DIAGNOSIS — E1159 Type 2 diabetes mellitus with other circulatory complications: Secondary | ICD-10-CM

## 2023-02-27 DIAGNOSIS — I10 Essential (primary) hypertension: Secondary | ICD-10-CM | POA: Diagnosis not present

## 2023-02-27 DIAGNOSIS — K219 Gastro-esophageal reflux disease without esophagitis: Secondary | ICD-10-CM

## 2023-02-27 DIAGNOSIS — I152 Hypertension secondary to endocrine disorders: Secondary | ICD-10-CM

## 2023-02-27 DIAGNOSIS — I6522 Occlusion and stenosis of left carotid artery: Secondary | ICD-10-CM | POA: Diagnosis not present

## 2023-02-27 DIAGNOSIS — E785 Hyperlipidemia, unspecified: Secondary | ICD-10-CM

## 2023-02-27 DIAGNOSIS — Z794 Long term (current) use of insulin: Secondary | ICD-10-CM

## 2023-02-27 DIAGNOSIS — E1169 Type 2 diabetes mellitus with other specified complication: Secondary | ICD-10-CM

## 2023-02-27 DIAGNOSIS — R1310 Dysphagia, unspecified: Secondary | ICD-10-CM

## 2023-02-27 DIAGNOSIS — E1142 Type 2 diabetes mellitus with diabetic polyneuropathy: Secondary | ICD-10-CM

## 2023-02-27 DIAGNOSIS — I639 Cerebral infarction, unspecified: Secondary | ICD-10-CM | POA: Diagnosis not present

## 2023-02-27 DIAGNOSIS — E782 Mixed hyperlipidemia: Secondary | ICD-10-CM | POA: Diagnosis not present

## 2023-02-27 HISTORY — PX: IR ANGIO INTRA EXTRACRAN SEL COM CAROTID INNOMINATE BILAT MOD SED: IMG5360

## 2023-02-27 HISTORY — PX: IR US GUIDE VASC ACCESS RIGHT: IMG2390

## 2023-02-27 HISTORY — PX: IR ANGIO VERTEBRAL SEL VERTEBRAL UNI R MOD SED: IMG5368

## 2023-02-27 LAB — BASIC METABOLIC PANEL
Anion gap: 15 (ref 5–15)
BUN: 17 mg/dL (ref 8–23)
CO2: 24 mmol/L (ref 22–32)
Calcium: 9.4 mg/dL (ref 8.9–10.3)
Chloride: 101 mmol/L (ref 98–111)
Creatinine, Ser: 0.85 mg/dL (ref 0.44–1.00)
GFR, Estimated: >60 mL/min (ref >60)
Glucose, Bld: 131 mg/dL — ABNORMAL HIGH (ref 70–99)
Potassium: 3.5 mmol/L (ref 3.5–5.1)
Sodium: 140 mmol/L (ref 135–145)

## 2023-02-27 LAB — CBC
HCT: 37.1 % (ref 36.0–46.0)
Hemoglobin: 12.5 g/dL (ref 12.0–15.0)
MCH: 29.6 pg (ref 26.0–34.0)
MCHC: 33.7 g/dL (ref 30.0–36.0)
MCV: 87.7 fL (ref 80.0–100.0)
Platelets: 269 10*3/uL (ref 150–400)
RBC: 4.23 MIL/uL (ref 3.87–5.11)
RDW: 15.2 % (ref 11.5–15.5)
WBC: 8.4 10*3/uL (ref 4.0–10.5)
nRBC: 0 % (ref 0.0–0.2)

## 2023-02-27 LAB — GLUCOSE, CAPILLARY
Glucose-Capillary: 115 mg/dL — ABNORMAL HIGH (ref 70–99)
Glucose-Capillary: 109 mg/dL — ABNORMAL HIGH (ref 70–99)
Glucose-Capillary: 173 mg/dL — ABNORMAL HIGH (ref 70–99)
Glucose-Capillary: 92 mg/dL (ref 70–99)

## 2023-02-27 MED ORDER — FAMOTIDINE 20 MG PO TABS
20.0000 mg | ORAL_TABLET | Freq: Two times a day (BID) | ORAL | Status: DC
  Administered 2023-02-27 – 2023-03-01 (×4): 20 mg via ORAL
  Filled 2023-02-27 (×4): qty 1

## 2023-02-27 MED ORDER — SODIUM CHLORIDE (PF) 0.9 % IJ SOLN
INTRAVENOUS | Status: AC | PRN
  Administered 2023-02-27: 200 ug via INTRA_ARTERIAL

## 2023-02-27 MED ORDER — MIDAZOLAM HCL 2 MG/2ML IJ SOLN
INTRAMUSCULAR | Status: AC
  Filled 2023-02-27: qty 2

## 2023-02-27 MED ORDER — VERAPAMIL HCL 2.5 MG/ML IV SOLN: INTRA_ARTERIAL | Status: AC | PRN

## 2023-02-27 MED ORDER — FENTANYL CITRATE (PF) 100 MCG/2ML IJ SOLN
INTRAMUSCULAR | Status: AC
  Filled 2023-02-27: qty 2

## 2023-02-27 MED ORDER — COLESTIPOL HCL 1 G PO TABS
1.0000 g | ORAL_TABLET | Freq: Two times a day (BID) | ORAL | Status: DC
  Administered 2023-02-27 – 2023-03-01 (×4): 1 g via ORAL
  Filled 2023-02-27 (×5): qty 1

## 2023-02-27 MED ORDER — NITROGLYCERIN 1 MG/10 ML FOR IR/CATH LAB
INTRA_ARTERIAL | Status: AC
  Filled 2023-02-27: qty 10

## 2023-02-27 MED ORDER — LIDOCAINE HCL 1 % IJ SOLN
INTRAMUSCULAR | Status: AC
  Filled 2023-02-27: qty 20

## 2023-02-27 MED ORDER — SODIUM CHLORIDE 0.9 % IV SOLN: INTRAVENOUS | Status: AC

## 2023-02-27 MED ORDER — VERAPAMIL HCL 2.5 MG/ML IV SOLN
INTRAVENOUS | Status: AC
  Filled 2023-02-27: qty 2

## 2023-02-27 MED ORDER — CARVEDILOL 6.25 MG PO TABS
6.2500 mg | ORAL_TABLET | Freq: Two times a day (BID) | ORAL | Status: DC
Start: 2023-02-28 — End: 2023-03-01
  Administered 2023-02-27 – 2023-03-01 (×4): 6.25 mg via ORAL
  Filled 2023-02-27 (×4): qty 1

## 2023-02-27 MED ORDER — FENTANYL CITRATE (PF) 100 MCG/2ML IJ SOLN
INTRAMUSCULAR | Status: AC | PRN
  Administered 2023-02-27: 25 ug via INTRAVENOUS

## 2023-02-27 MED ORDER — METHOCARBAMOL 500 MG PO TABS
500.0000 mg | ORAL_TABLET | Freq: Three times a day (TID) | ORAL | Status: DC | PRN
Start: 2023-02-27 — End: 2023-03-01

## 2023-02-27 MED ORDER — HEPARIN SODIUM (PORCINE) 1000 UNIT/ML IJ SOLN
INTRAMUSCULAR | Status: AC
  Filled 2023-02-27: qty 10

## 2023-02-27 MED ORDER — IOHEXOL 300 MG/ML  SOLN
150.0000 mL | Freq: Once | INTRAMUSCULAR | Status: AC | PRN
  Administered 2023-02-27: 91 mL via INTRA_ARTERIAL

## 2023-02-27 MED ORDER — OXYBUTYNIN CHLORIDE ER 10 MG PO TB24
10.0000 mg | ORAL_TABLET | Freq: Every day | ORAL | Status: DC
  Administered 2023-02-27 – 2023-03-01 (×3): 10 mg via ORAL
  Filled 2023-02-27 (×3): qty 1

## 2023-02-27 MED ORDER — FERROUS SULFATE 325 (65 FE) MG PO TABS
325.0000 mg | ORAL_TABLET | Freq: Every day | ORAL | Status: DC
  Administered 2023-02-28 – 2023-03-01 (×2): 325 mg via ORAL
  Filled 2023-02-27 (×2): qty 1

## 2023-02-27 MED ORDER — LIDOCAINE HCL 1 % IJ SOLN
20.0000 mL | Freq: Once | INTRAMUSCULAR | Status: AC
  Administered 2023-02-27: 10 mL via INTRADERMAL
  Filled 2023-02-27: qty 20

## 2023-02-27 MED ORDER — MIDAZOLAM HCL 2 MG/2ML IJ SOLN
INTRAMUSCULAR | Status: AC | PRN
  Administered 2023-02-27: 1 mg via INTRAVENOUS

## 2023-02-27 NOTE — Progress Notes (Signed)
STROKE TEAM PROGRESS NOTE   INTERIM HISTORY/SUBJECTIVE Patient just returned from diagnostic cerebral catheter angiogram by Dr. Corliss Skains which showed severe preocclusive left proximal ICA stenosis.  Neurological exam is unchanged.  Vital signs are stable. Patient's son is at the bedside.  Long discussion with them about stroke risk for asymptomatic severe carotid stenosis and discussed treatment options including carotid endarterectomy versus angioplasty stenting versus aggressive medical therapy.  Patient and son are leaning towards elective left carotid revascularization and prefer angioplasty stenting.  Recommend continue ongoing therapy and rehab and towards the end of the rehab stay elective left ICA angioplasty stenting Dr. Corliss Skains. OBJECTIVE  CBC    Component Value Date/Time   WBC 8.4 02/27/2023 0334   RBC 4.23 02/27/2023 0334   HGB 12.5 02/27/2023 0334   HGB 10.4 (L) 09/17/2018 1523   HCT 37.1 02/27/2023 0334   HCT 33.0 (L) 09/17/2018 1523   PLT 269 02/27/2023 0334   PLT 343 09/17/2018 1523   MCV 87.7 02/27/2023 0334   MCV 78 (L) 09/17/2018 1523   MCH 29.6 02/27/2023 0334   MCHC 33.7 02/27/2023 0334   RDW 15.2 02/27/2023 0334   RDW 16.5 (H) 09/17/2018 1523   LYMPHSABS 1.3 02/25/2023 1113   LYMPHSABS 2.1 09/17/2018 1523   MONOABS 0.5 02/25/2023 1113   EOSABS 0.1 02/25/2023 1113   EOSABS 0.4 09/17/2018 1523   BASOSABS 0.1 02/25/2023 1113   BASOSABS 0.1 09/17/2018 1523    BMET    Component Value Date/Time   NA 140 02/27/2023 0334   NA 139 02/11/2023 0945   K 3.5 02/27/2023 0334   CL 101 02/27/2023 0334   CO2 24 02/27/2023 0334   GLUCOSE 131 (H) 02/27/2023 0334   BUN 17 02/27/2023 0334   BUN 18 02/11/2023 0945   CREATININE 0.85 02/27/2023 0334   CALCIUM 9.4 02/27/2023 0334   EGFR 44 (L) 02/11/2023 0945   GFRNONAA >60 02/27/2023 0334    IMAGING past 24 hours No results found.   Vitals:   02/27/23 0955 02/27/23 1000 02/27/23 1015 02/27/23 1035  BP: 131/79  135/77 127/83 (!) 155/94  Pulse: 71 73 70 70  Resp: 16 17  17   Temp:    97.7 F (36.5 C)  TempSrc:    Oral  SpO2: 97% 93% 94% 94%     PHYSICAL EXAM General:  Alert, well-nourished, well-developed patient in no acute distress Psych:  Mood and affect appropriate for situation Respiratory:  Regular, unlabored respirations on room air  NEURO:  Mental Status: AA&Ox3, patient is able to give clear and coherent history Speech/Language: speech is without dysarthria or aphasia.    Cranial Nerves:  II: Left pupil larger than right, both reactive to light (patient is status post cataract surgery) III, IV, VI: EOMI. Eyelids elevate symmetrically.  V: Sensation is intact to light touch and symmetrical to face.  VII: Subtle right facial droop VIII: hearing intact to voice. IX, X: Phonation is normal.  XII: tongue is midline without fasciculations. Motor: Able to move all 4 extremities with good antigravity strength but diminished fine finger movements on the right, left arm orbits right and slight weakness of right hand grip.  Some drift noted in right leg with increased tone Tone: is increased in the right lower extremity Sensation- Intact to light touch bilaterally.  Coordination: FTN intact bilaterally Gait- deferred  Most Recent NIH  1a Level of Conscious.: 0 1b LOC Questions: 0 1c LOC Commands: 0 2 Best Gaze: 0 3 Visual: 0 4 Facial  Palsy: 1 5a Motor Arm - left: 0 5b Motor Arm - Right: 0 6a Motor Leg - Left: 0 6b Motor Leg - Right: 1 7 Limb Ataxia: 0 8 Sensory: 0 9 Best Language: 0 10 Dysarthria: 0 11 Extinct. and Inatten.: 0 TOTAL: 2   ASSESSMENT/PLAN  Natasha Vasquez is a 83 y.o. female with history of hypertension, GERD, hide diabetes and hyperlipidemia admitted for incoordination and weakness of the right side of sudden onset.  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  She also has severe left ICA stenosis and will have an angiogram with possible  treatment tomorrow.  NIH on Admission 3  Acute Ischemic Infarct:  left brain infarct Etiology: Likely small vessel disease..  With likely asymptomatic high-grade proximal left ICA   stenosis CT head age-indeterminate superior cerebellar infarct CTA head & neck calcified plaque at left carotid bifurcation and left ICA bulb with near occlusion of left ICA bulb MRI acute infarct in the left midbrain and left cerebral peduncle, old small vessel infarction and cerebral pons, mild small vessel ischemic changes Patient will need follow-up MRI in 3 months given unusual appearance of stroke 2D Echo EF 55 to 60%, mild LVH of the septal segment, grade 1 diastolic dysfunction, normal left atrial size, normal atrial septum with no evidence of interatrial shunt LDL 81 HgbA1c 6.6 VTE prophylaxis -Lovenox aspirin 81 mg daily prior to admission, now on aspirin 81 mg daily and clopidogrel 75 mg daily for 3 weeks and then Plavix alone. Therapy recommendations:  CIR Disposition: Pending  Severe left ICA stenosis Near occlusion of left ICA seen on CT angiogram Will perform angiogram with intention to treat tomorrow  Hypertension Home meds: Carvedilol 6.25 mg twice daily, lisinopril 10 mg daily Stable Blood Pressure Goal: BP less than 220/110   Hyperlipidemia Home meds: Rosuvastatin 10 mg daily, increased to 20 LDL 81, goal < 70 Continue statin at discharge  Diabetes type II Controlled Home meds: Mounjaro 5 mg weekly HgbA1c 6.6, goal < 7.0 CBGs SSI Recommend close follow-up with PCP for better DM control  Dysphagia Patient has post-stroke dysphagia, SLP consulted    Diet   Diet Heart Room service appropriate? Yes; Fluid consistency: Thin   Advance diet as tolerated  Other Stroke Risk Factors Obesity, There is no height or weight on file to calculate BMI., BMI >/= 30 associated with increased stroke risk, recommend weight loss, diet and exercise as appropriate    Other Active  Problems None  Hospital day # 2  Patient presented with sudden onset of right hemiataxia and weakness slight pupillary asymmetry secondary to left midbrain infarct etiology likely small vessel disease .infarct distribution is unusual for a vascular territory and may need to consider follow-up MRI scan of the brain with and without contrast if she does not show significant improvement.  Cerebral catheter angiogram shows heavily calcified proximal left ICA   near occlusive stenosis.  Recommended continue aggressive medical therapy for now continue dual antiplatelet therapy for 3 weeks and then Plavix alone and aggressive risk factor modification.  Long discussion with patient and son regarding stroke risk from near occlusive carotid asymptomatic stenosis and benefit of revascularization with angioplasty stenting versus carotid surgery and medical therapy.  They prefer to have revascularization done and before angioplasty stenting or carotid surgery.  Recommend continue ongoing therapy and rehab and elective left carotid revascularization in 2-4 weeks by Dr. Corliss Skains.  Follow-up in stroke clinic in 2 months.  Stroke  team will sign off    Discussed with Dr. Corliss Skains and Dr. Janee Morn Greater than 50% time during this 35 minute visit was spent on counseling and coordination of care about her stroke and asymptomatic high-grade carotid stenosis and discussion about stroke risk and answering questions Delia Heady, MD Medical Director [facility] Pager: [phone] 02/27/2023 2:00 PM   To contact Stroke Continuity provider, please refer to [email or web address]. After hours, contact General Neurology

## 2023-02-27 NOTE — Progress Notes (Signed)
PT Cancellation Note  Patient Details Name: Natasha Vasquez MRN: [id number] DOB: 05/10/1940   Cancelled Treatment:    Reason Eval/Treat Not Completed: Patient at procedure or test/unavailable. Pt down for swallow study.   Angelina Ok [facility] 02/27/2023, 2:47 PM Skip Mayer PT Acute [employer] [phone]

## 2023-02-27 NOTE — Sedation Documentation (Signed)
Patient transported to 4E07 via bed. Bedside report given to RN. Femoral site assessed - Level 0, no hematoma, dressing is clean, dry, and intact. Pulses also assessed bilaterally. Radial Site assessed - radial occlusive device (TR band) in place, pulses assessed.

## 2023-02-27 NOTE — Evaluation (Signed)
Speech Language Pathology Evaluation Patient Details Name: Natasha Vasquez MRN: [id number] DOB: 1940-11-24 Today's Date: 02/27/2023 Time: 7829-5621 SLP Time Calculation (min) (ACUTE ONLY): 15 min  Problem List:  Patient Active Problem List   Diagnosis Date Noted   ICAO (internal carotid artery occlusion), left 02/26/2023   Brainstem infarct, acute (HCC) 02/26/2023   Right sided weakness 02/25/2023   CVA (cerebrovascular accident) (HCC) 02/25/2023   Moderate episode of recurrent major depressive disorder (HCC) 02/11/2023   Sciatic pain, left 09/27/2021   SOB (shortness of breath) 05/16/2021   Aneurysm of ascending aorta without rupture (HCC) 05/10/2021   HLD (hyperlipidemia) 05/04/2021   Elevated troponin 05/04/2021   Chronic diastolic CHF (congestive heart failure) (HCC) 05/04/2021   Recurrent major depressive disorder, in partial remission (HCC) 03/24/2021   Gout 03/24/2021   PAD (peripheral artery disease) (HCC) 06/24/2020   Iron deficiency 10/07/2018   Primary osteoarthritis of left knee 09/17/2018   Emphysema lung (HCC) 03/14/2018   Diabetic peripheral neuropathy associated with type 2 diabetes mellitus (HCC) 03/14/2018   Metatarsalgia of both feet 03/14/2018   Abnormality of lung 03/14/2018   Acquired absence of right breast 06/11/2017   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   Dysphagia 03/11/2017   Obesity 03/11/2017   Subclinical hypothyroidism 06/08/2015   Osteopenia 06/08/2015   Billowing mitral valve 06/08/2015   Melanoma in situ (HCC) 06/08/2015   Cramps of lower extremity 06/08/2015   Type 2 diabetes mellitus (HCC) 05/18/2015   Chronic diarrhea 05/18/2015   Anemia 05/18/2015   Clinical depression 01/26/2015   Hyperlipidemia associated with type 2 diabetes mellitus (HCC) 10/28/2014   Hypertension associated with diabetes (HCC) 09/27/2014   GERD (gastroesophageal  reflux disease) 08/06/2014   Overactive bladder 07/16/2014   Past Medical History:  Past Medical History:  Diagnosis Date   Anemia in pregnancy    Arthritis    hands   Barrett esophagus    Bell palsy 04/24/2004   Breast neoplasm, Tis (DCIS), right 06/03/2017   36 mm area DCIS.  ER/PR NEGATIVE   Cataract    Chronic cough    Complication of anesthesia    20 years ago pts b/p dropped really low, "coded"   Depression    Depression    Diabetes mellitus without complication (HCC)    diet controlled   Family history of adverse reaction to anesthesia    son PONV   GERD (gastroesophageal reflux disease)    Gout    Hypercholesteremia    Hyperlipidemia    Hypertension    Melanoma (HCC)    MVP (mitral valve prolapse)    followed by PCP   Nephritis    Nephritis    S/P appendectomy    Thoracic aortic aneurysm (HCC)    Torn meniscus    left   Past Surgical History:  Past Surgical History:  Procedure Laterality Date   ABDOMINAL HYSTERECTOMY     APPENDECTOMY     BREAST BIOPSY Right 04/25/2017   retroareolar 10:00   wing clip   path pending   BREAST BIOPSY Right 04/25/2017   10:00  5CMFN  venus clip    path pending   BREAST BIOPSY Right 05/03/2017   Affirm Bx- path pending   BREAST CYST ASPIRATION Bilateral    NEG   BREAST EXCISIONAL BIOPSY Left 20+ yrs ago   NEG   BREAST RECONSTRUCTION WITH PLACEMENT OF TISSUE EXPANDER AND FLEX HD (  ACELLULAR HYDRATED DERMIS) Right 06/03/2017   Procedure: BREAST RECONSTRUCTION WITH PLACEMENT OF TISSUE EXPANDER AND FLEX HD (ACELLULAR HYDRATED DERMIS);  Surgeon: Peggye Form, DO;  Location: [facility];  Service: Plastics;  Laterality: Right;   BREAST REDUCTION SURGERY Left 11/06/2017   Procedure: BREAST REDUCTION;  Surgeon: Peggye Form, DO;  Location: [facility];  Service: Plastics;  Laterality: Left;   BROW LIFT Bilateral 02/01/2015   Procedure: BLEPHAROPLASTY bilateral upper eyelids.;  Surgeon: Imagene Riches, MD;  Location: [facility];  Service: Ophthalmology;  Laterality: Bilateral;  DIABETIC - diet controlled   CATARACT EXTRACTION W/PHACO Left 01/14/2019   Procedure: CATARACT EXTRACTION PHACO AND INTRAOCULAR LENS PLACEMENT (IOC) LEFT TECNIS TORIC ADD 5.62 00:46.5 30.0%;  Surgeon: Lockie Mola, MD;  Location: [facility];  Service: Ophthalmology;  Laterality: Left;  diabetic - diet controlled   CATARACT EXTRACTION W/PHACO Right 02/11/2019   Procedure: CATARACT EXTRACTION PHACO AND INTRAOCULAR LENS PLACEMENT (IOC) RIGHT DIABETIC 4.82 00:46.9 10.3%;  Surgeon: Lockie Mola, MD;  Location: [facility];  Service: Ophthalmology;  Laterality: Right;  Diabetic - diet controlled   CHOLECYSTECTOMY     COLONOSCOPY     COLONOSCOPY WITH PROPOFOL N/A 05/11/2015   Procedure: COLONOSCOPY WITH PROPOFOL;  Surgeon: Scot Jun, MD;  Location: [facility];  Service: Endoscopy;  Laterality: N/A;   COLONOSCOPY WITH PROPOFOL N/A 09/07/2020   Procedure: COLONOSCOPY WITH PROPOFOL;  Surgeon: Toledo, Boykin Nearing, MD;  Location: [facility];  Service: Gastroenterology;  Laterality: N/A;   ESOPHAGOGASTRODUODENOSCOPY     ESOPHAGOGASTRODUODENOSCOPY (EGD) WITH PROPOFOL  05/11/2015   Procedure: ESOPHAGOGASTRODUODENOSCOPY (EGD) WITH PROPOFOL;  Surgeon: Scot Jun, MD;  Location: [facility];  Service: Endoscopy;;   FRACTURE SURGERY Right    arm and shoulder   JOINT REPLACEMENT     KNEE ARTHROSCOPY Left 03/14/2016   Procedure: ARTHROSCOPY KNEE, PARTIAL MEDIAL MENISECTOMY, CHONDROPLASTY;  Surgeon: Donato Heinz, MD;  Location: [facility];  Service: Orthopedics;  Laterality: Left;   MASTECTOMY Right 2019   MASTECTOMY W/ SENTINEL NODE BIOPSY Right 06/03/2017   Procedure: MASTECTOMY WITH SENTINEL LYMPH NODE BIOPSY;  Surgeon: Earline Mayotte, MD;  Location: [facility];  Service: General;  Laterality: Right;   PARTIAL HIP ARTHROPLASTY Right    REDUCTION MAMMAPLASTY Left 2019   REMOVAL OF BILATERAL TISSUE  EXPANDERS WITH PLACEMENT OF BILATERAL BREAST IMPLANTS Right 11/06/2017   Procedure: REMOVAL OF TISSUE EXPANDER WITH PLACEMENT OF BREAST IMPLANT;  Surgeon: Peggye Form, DO;  Location: [facility];  Service: Plastics;  Laterality: Right;   TONSILLECTOMY     HPI:  Natasha Vasquez is a 83 y.o. female presented to the [facility] ED 2/10 for evaluation of right-sided weakness and balance problems. 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 Patient transferred to [facility] same day for potential arteriogram for 02/26/23. PHMx: HTN, T2DM, HLD, depression, OSA with CPAP, osteoarthritis, ascending thoracic aortic aneurysm, asthma and GERD, Barretts esophagus. BSE 2/10 at [facility] revealed s/s pharyngeal dysphagia and recommended Dys 3, nectar thick liquids with right head turn until MBS can be performed.   Assessment / Plan / Recommendation Clinical Impression  Pt independent prior to admission living with husband who has mild dementia. Pt and son have no concerns with speech-language-cognition s/p stroke. Her speech is intelligible-slighlty slurred today due to having sedation for arteriogram. SLP met pt yesterday for swallow assessment and speech was not slurred. She was able to express herself and comprehend language without difficulty.  She was given the Cognistat and scored within average range in all areas except memory (scored 9 and 10 is considered average). Pt was able to recall functional information regarding what time she is allowed to sit up following procedure (prospective memory). She reports using her phone, post it notes to recall information. No further ST is warranted for communication-linguistics and pt/family in agreement.    SLP Assessment  SLP Recommendation/Assessment: Patient does not need any further Speech Lanaguage Pathology Services SLP Visit Diagnosis: Cognitive communication deficit (R41.841)    Recommendations for follow up therapy  are one component of a multi-disciplinary discharge planning process, led by the attending physician.  Recommendations may be updated based on patient status, additional functional criteria and insurance authorization.    Follow Up Recommendations  No SLP follow up (for speech-language-cognition)    Assistance Recommended at Discharge  None  Functional Status Assessment Patient has not had a recent decline in their functional status  Frequency and Duration           SLP Evaluation Cognition  Overall Cognitive Status: Within Functional Limits for tasks assessed Arousal/Alertness: Awake/alert Orientation Level: Oriented X4 Year: 2025 Month: February Day of Week: Correct Attention: Sustained Sustained Attention: Appears intact Memory: Impaired (scored a 9 (10 in within normal range), able to recall information she hears, prospective memory) Memory Impairment: Retrieval deficit Awareness: Appears intact Problem Solving: Appears intact Safety/Judgment: Appears intact       Comprehension  Auditory Comprehension Overall Auditory Comprehension: Appears within functional limits for tasks assessed Visual Recognition/Discrimination Discrimination: Not tested Reading Comprehension Reading Status: Not tested    Expression Expression Primary Mode of Expression: Verbal Verbal Expression Overall Verbal Expression: Appears within functional limits for tasks assessed Initiation: No impairment Level of Generative/Spontaneous Verbalization: Conversation Repetition: No impairment Naming: No impairment Pragmatics: No impairment Written Expression Written Expression: Not tested   Oral / Motor  Oral Motor/Sensory Function Overall Oral Motor/Sensory Function: Mild impairment Facial ROM: Reduced right;Suspected CN VII (facial) dysfunction Facial Symmetry: Abnormal symmetry right;Suspected CN VII (facial) dysfunction Lingual ROM: Within Functional Limits Lingual Symmetry: Abnormal symmetry  right;Suspected CN XII (hypoglossal) dysfunction (slight asymmetry) Motor Speech Overall Motor Speech: Appears within functional limits for tasks assessed Respiration: Within functional limits Phonation: Normal Resonance: Within functional limits Articulation: Within functional limitis Intelligibility: Intelligible Motor Planning: Witnin functional limits Motor Speech Errors: Not applicable            Royce Macadamia 02/27/2023, 2:08 PM

## 2023-02-27 NOTE — Procedures (Signed)
INR.  Status post right vertebral angiogram and bilateral common carotid arteriograms.  Right radial approach and left common femoral artery approach.  No acute complications.  Findings.  1.  Severe pre occlusive stenosis of the proximal left ICA.  Fatima Sanger MD.

## 2023-02-27 NOTE — PMR Pre-admission (Shared)
PMR Admission Coordinator Pre-Admission Assessment  Patient: Natasha Vasquez is an 83 y.o., female MRN: [id number] DOB: 1940-06-05 Height:   Weight:               Insurance Information HMO:     PPO: yes     PCP:      IPA:      80/20:      OTHER:  PRIMARY: [employer] PPO      Policy#: [id number]      Subscriber: pt CM Name: ***      Phone#: ***     Fax#: *** Pre-Cert#: ***      Employer:  Benefits:  Phone #: [phone]     Name: 2/12 Eff. Date: ***     Deduct: ***      Out of Pocket Max: ***      Life Max: ***  CIR: ***      SNF: *** Outpatient: ***     Co-Pay: *** Home Health: ***      Co-Pay: *** DME: ***     Co-Pay: *** Providers: in network  SECONDARY: none        Financial Counselor:       Phone#:   The Engineer, materials Information Summary" for patients in Inpatient Rehabilitation Facilities with attached "Privacy Act Statement-Health Care Records" was provided and verbally reviewed with: Patient and Family  Emergency Contact Information Contact Information     Name Relation Home Work [name] [phone]  [phone]   Natasha Needs. Son   [phone]      Other Contacts   None on File    Current Medical History  Patient Admitting Diagnosis: CVA  History of Present Illness: 83 year old female with history of HTN, GERD, DM, HLD, depression, OSA with CPAP, osteoarthritis, ascending thoracic aortic aneurysm, asthma who presented to [facility] ED on 2/10 for right sided weakness and balance problems.   CTA head and neck showed calcified plaque at the left carotic bifurcation and ICA above with near occlusion. 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 Neurology consulted and recommended transfer to [facility] for IR evaluation of her left ICA stenosis. Transferred on 2/10.  SLP consulted with patient placed on dysphagia diet. 2 d echo with EF 55 to 60%, mild LVH of the septal segment, grade 1  diastolic dysfunction, normal left atrial size , normal atrial septum with no evidence of interatrial shunt. VTE prophylaxis with lovenox. ASA prior to admit. Now on ASA and clopidogrel and then Plavix alone. Severe left ICA stenosis with angiogram on 2/12 pending. Home meds carvedilol and lisinopril. LDL 81, continue statin at discharge. On Mounjaro at home. Hgb A1c 6.6. CBGS and SSI.   On 2/12 underwent diagnostic cerebral catheter angiogram which showed severe preocclusive left proximal ICA stenosis.Treatment options to include carotid endarterectomy versus angioplasty versus aggressive medical therapy has been discussed. Current thoughts are for elective left ICA angioplasty stenting after rehab.  Complete NIHSS TOTAL: 3 Glasgow Coma Scale Score: 15  Patient's medical record from [facility] and [facility] has been reviewed by the rehabilitation admission coordinator and physician.  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  ER/PR NEGATIVE   Cataract  Chronic cough    Complication of anesthesia    20 years ago pts b/p dropped really low, "coded"   Depression    Depression    Diabetes mellitus without complication (HCC)    diet controlled   Family history of adverse reaction to anesthesia    son PONV   GERD (gastroesophageal reflux disease)    Gout    Hypercholesteremia    Hyperlipidemia    Hypertension    Melanoma (HCC)    MVP (mitral valve prolapse)    followed by PCP   Nephritis    Nephritis    S/P appendectomy    Thoracic aortic aneurysm (HCC)    Torn meniscus    left   Has the patient had major surgery during 100 days prior to admission? No  Family History  family history includes Atrial fibrillation in her father; Cancer in her brother and mother; Colon cancer in her mother; Diabetes in  her father; Healthy in her son and son; Heart disease in her father; Hypertension in her father.   Current Medications   Current Facility-Administered Medications:    0.9 %  sodium chloride infusion, , Intravenous, Continuous, Rodolph Bong, MD   0.9 %  sodium chloride infusion, , Intravenous, Continuous, Deveshwar, Simonne Maffucci, MD   acetaminophen (TYLENOL) tablet 650 mg, 650 mg, Oral, Q4H PRN, 650 mg at 02/27/23 1053 **OR** acetaminophen (TYLENOL) 160 MG/5ML solution 650 mg, 650 mg, Per Tube, Q4H PRN **OR** acetaminophen (TYLENOL) suppository 650 mg, 650 mg, Rectal, Q4H PRN, Steffanie Rainwater, MD   allopurinol (ZYLOPRIM) tablet 200 mg, 200 mg, Oral, Daily, Vann, Jessica U, DO, 200 mg at 02/27/23 1055   ARIPiprazole (ABILIFY) tablet 2 mg, 2 mg, Oral, Daily, Vann, Jessica U, DO, 2 mg at 02/27/23 1055   aspirin chewable tablet 81 mg, 81 mg, Oral, Daily, Han, Aimee H, PA-C, 81 mg at 02/27/23 1055   [START ON 02/28/2023] carvedilol (COREG) tablet 6.25 mg, 6.25 mg, Oral, BID, Rodolph Bong, MD, 6.25 mg at 02/27/23 1053   clopidogrel (PLAVIX) tablet 75 mg, 75 mg, Oral, Daily, Han, Aimee H, PA-C, 75 mg at 02/27/23 1054   enoxaparin (LOVENOX) injection 40 mg, 40 mg, Subcutaneous, Q24H, Amponsah, Flossie Buffy, MD, 40 mg at 02/27/23 1056   fluticasone furoate-vilanterol (BREO ELLIPTA) 100-25 MCG/ACT 1 puff, 1 puff, Inhalation, Daily **AND** umeclidinium bromide (INCRUSE ELLIPTA) 62.5 MCG/ACT 1 puff, 1 puff, Inhalation, Daily, Vann, Jessica U, DO   insulin aspart (novoLOG) injection 0-15 Units, 0-15 Units, Subcutaneous, TID WC, Amponsah, Flossie Buffy, MD   ipratropium-albuterol (DUONEB) 0.5-2.5 (3) MG/3ML nebulizer solution 3 mL, 3 mL, Nebulization, Q6H PRN, Kirke Corin, Flossie Buffy, MD   methocarbamol (ROBAXIN) tablet 500 mg, 500 mg, Oral, Q8H PRN, Rodolph Bong, MD   pantoprazole (PROTONIX) EC tablet 40 mg, 40 mg, Oral, Daily, Vann, Jessica U, DO, 40 mg at 02/27/23 1055   phenol (CHLORASEPTIC) mouth  spray 1 spray, 1 spray, Mouth/Throat, PRN, Vann, Jessica U, DO, 1 spray at 02/26/23 1203   rosuvastatin (CRESTOR) tablet 20 mg, 20 mg, Oral, Daily, de La Torre, [place] E, NP, 20 mg at 02/27/23 1055   senna-docusate (Senokot-S) tablet 1 tablet, 1 tablet, Oral, QHS PRN, Steffanie Rainwater, MD   venlafaxine XR (EFFEXOR-XR) 24 hr capsule 225 mg, 225 mg, Oral, Daily, Vann, Jessica U, DO, 225 mg at 02/27/23 1054  Patients Current Diet:  Diet Order             Diet Heart Room service appropriate?  Yes; Fluid consistency: Thin  Diet effective now                  Precautions / Restrictions Precautions Precautions: Fall Restrictions Weight Bearing Restrictions Per Provider Order: No   Has the patient had 2 or more falls or a fall with injury in the past year?No  Prior Activity Level Community (5-7x/wk): Independent without AD, driving  Prior Functional Level Prior Function Prior Level of Function : Independent/Modified Independent, Driving Mobility Comments: no use of AD prior to admission ADLs Comments: independent  Self Care: Did the patient need help bathing, dressing, using the toilet or eating?  Independent  Indoor Mobility: Did the patient need assistance with walking from room to room (with or without device)? Independent  Stairs: Did the patient need assistance with internal or external stairs (with or without device)? Independent  Functional Cognition: Did the patient need help planning regular tasks such as shopping or remembering to take medications? Independent  Patient Information Are you of Hispanic, Latino/a,or Spanish origin?: A. No, not of Hispanic, Latino/a, or Spanish origin What is your race?: A. White Do you need or want an interpreter to communicate with a doctor or health care staff?: 0. No  Patient's Response To:  Health Literacy and Transportation Is the patient able to respond to health literacy and transportation needs?: Yes Health Literacy - How  often do you need to have someone help you when you read instructions, pamphlets, or other written material from your doctor or pharmacy?: Never In the past 12 months, has lack of transportation kept you from medical appointments or from getting medications?: No In the past 12 months, has lack of transportation kept you from meetings, work, or from getting things needed for daily living?: No  Journalist, newspaper / Equipment Home Equipment: Gilmer Mor - single point, Information systems manager, Grab bars - tub/shower, Grab bars - toilet, BSC/3in1  Prior Device Use: Indicate devices/aids used by the patient prior to current illness, exacerbation or injury? None of the above  Current Functional Level Cognition  Arousal/Alertness: Awake/alert Overall Cognitive Status: Within Functional Limits for tasks assessed Orientation Level: Oriented X4 Attention: Sustained Sustained Attention: Appears intact Memory: Impaired (scored a 9 (10 in within normal range), able to recall information she hears, prospective memory) Memory Impairment: Retrieval deficit Awareness: Appears intact Problem Solving: Appears intact Safety/Judgment: Appears intact    Extremity Assessment (includes Sensation/Coordination)  Upper Extremity Assessment: Right hand dominant, RUE deficits/detail RUE Deficits / Details: delayed /slower movement than LUE with grossly 3/5 all joints. 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.Slight dysmetria with finger to nose with eyes open--did not try with eyes closed RUE Coordination: decreased fine motor, decreased gross motor  Lower Extremity Assessment: RLE deficits/detail RLE Deficits / Details: decreased coordination RLE Sensation: decreased light touch RLE Coordination: decreased gross motor    ADLs  Overall ADL's : Needs assistance/impaired Eating/Feeding: Sitting, Set  up Eating/Feeding Details (indicate cue type and reason): increaesd time and effort due to decreased use of RUE Grooming: Set up, Sitting Grooming Details (indicate cue type and reason): increaesd time and effort due to decreased use of RUE Upper Body Bathing: Set up, Sitting Upper Body Bathing Details (indicate cue type and reason): increaesd time and effort due to decreased use of RUE Lower Body Bathing: Contact guard assist, Sit to/from stand Lower Body Bathing Details (indicate  cue type and reason): increaesd time and effort due to decreased use of RUE Upper Body Dressing : Set up, Sitting Upper Body Dressing Details (indicate cue type and reason): increaesd time and effort due to decreased use of RUE Lower Body Dressing: Contact guard assist, Sit to/from stand Lower Body Dressing Details (indicate cue type and reason): increaesd time and effort due to decreased use of RUE; pt does not normally wear socks anytime of the year Toilet Transfer: Minimal assistance, Ambulation Toilet Transfer Details (indicate cue type and reason): simulated by steps at bedside Toileting- Clothing Manipulation and Hygiene: Minimal assistance Toileting - Clothing Manipulation Details (indicate cue type and reason): increaesd time and effort due to decreased use of RUE; CGA sit<>stand    Mobility  Overal bed mobility: Modified Independent    Transfers  Overall transfer level: Needs assistance Equipment used: None Transfers: Bed to chair/wheelchair/BSC Sit to Stand: Contact guard assist Bed to/from chair/wheelchair/BSC transfer type:: Step pivot Step pivot transfers: Min assist General transfer comment: CGA sit<>stand with Min A stand step to recliner from bed with one hand A    Ambulation / Gait / Stairs / Wheelchair Mobility  Ambulation/Gait Ambulation/Gait assistance: Contact guard assist Gait Distance (Feet): 25 Feet Assistive device: Rolling walker (2 wheels) Gait Pattern/deviations: Step-to  pattern, Ataxic, Scissoring, Narrow base of support General Gait Details: Patient demonstrates poor coordination of R LE, poor foot placement, narrow base of support, almost scissoring of R LE, R foot also wants to roll in. Gait velocity: decr    Posture / Balance Balance Overall balance assessment: Needs assistance Sitting-balance support: No upper extremity supported, Feet supported Sitting balance-Leahy Scale: Good Standing balance support: Single extremity supported Standing balance-Leahy Scale: Poor    Special needs/care consideration Hgb A1c 6.6     Previous Home Environment  Living Arrangements: Other (Comment) (ILF [facility] for 6 years with spouse)  Lives With: Spouse Available Help at Discharge:  (spouse 24/7) Type of Home: Independent living facility Home Layout: One level Home Access: Level entry Bathroom Shower/Tub: Health visitor: Handicapped height Bathroom Accessibility: Yes Home Care Services: No Additional Comments: Husband has mild dementia--still able to care for himself and drive  Discharge Living Setting Plans for Discharge Living Setting: Lives with (comment), Other (Comment) (spouse ILF, [facility]) Type of Home at Discharge: Independent living facility Care Facility Name at Discharge: [facility] for 6 years Discharge Home Layout: One level Discharge Home Access: Level entry Discharge Bathroom Shower/Tub: Walk-in shower Discharge Bathroom Toilet: Handicapped height Discharge Bathroom Accessibility: Yes How Accessible: Accessible via walker Does the patient have any problems obtaining your medications?: No  Social/Family/Support Systems Patient Roles: Spouse, Parent Contact Information: sons, Dorene Sorrow Anticipated Caregiver: spouse mild dementia and drives, can provide supervision Anticipated Caregiver's Contact Information: see contacts Ability/Limitations of Caregiver: spouse with mild dementia, still drives, can provide  supervision Caregiver Availability: 24/7 Discharge Plan Discussed with Primary Caregiver: Yes Is Caregiver In Agreement with Plan?: Yes Does Caregiver/Family have Issues with Lodging/Transportation while Pt is in Rehab?: No  Goals Patient/Family Goal for Rehab: Mod I with PT and OT Expected length of stay: ELOS 7 to 10 days Additional Information: 2 children are local and very involved Pt/Family Agrees to Admission and willing to participate: Yes Program Orientation Provided & Reviewed with Pt/Caregiver Including Roles  & Responsibilities: Yes  Decrease burden of Care through IP rehab admission: n/a  Possible need for SNF placement upon discharge:not  anticipated  Patient Condition: This patient's condition remains as  documented in the consult dated 02/27/23, in which the Rehabilitation Physician determined and documented that the patient's condition is appropriate for intensive rehabilitative care in an inpatient rehabilitation facility. Will admit to inpatient rehab today.  Preadmission Screen Completed By:  Clois Dupes, RN MSN 02/27/2023 2:18 PM ______________________________________________________________________   Discussed status with Dr. Marland Kitchenon***at *** and received approval for admission today.  Admission Coordinator:  Clois Dupes RN MSN time***/Date***

## 2023-02-27 NOTE — Progress Notes (Signed)
PT Cancellation Note  Patient Details Name: Natasha Vasquez MRN: [id number] DOB: [birth year]   Cancelled Treatment:    Reason Eval/Treat Not Completed: Patient at procedure or test/unavailable;Active bedrest order. Pt down in radiology for arteriogram and now on bedrest after sheath removal.   Angelina Ok [facility] 02/27/2023, 10:50 AM Skip Mayer PT Acute Rehabilitation Services Office [phone]

## 2023-02-27 NOTE — Consult Note (Addendum)
Physical Medicine and Rehabilitation Consult Reason for Consult:Rehab Referring Physician: Dr. Pearlean Brownie   HPI: Natasha Vasquez is a 83 y.o. female with past medical history of hypertension, diabetes mellitus, gout, GERD, OSA on CPAP, osteoarthritis, thoracic aortic aneurysm, who presented to the hospital after developing right-sided weakness and balance problems when she woke up in the morning.  Patient was outside the window for TNK.  CT head on 2/10 with age-indeterminate left superior cerebellar infarct.  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  CTA with aortic atherosclerosis, calcified plaque left ICA stenosis.  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  Current plan for elective left carotid revascularization about 2 to 4 weeks with Dr. Corliss Skains.  Patient was seen by PT and OT and found to have functional deficits.  Patient noted to have dysphagia followed by SLP and was on D3 nectar diet above.  She was transition to a heart healthy thin diet today.  Review of Systems  Constitutional:  Negative for chills and fever.  HENT:  Negative for hearing loss.   Eyes:  Positive for blurred vision (chronic). Negative for double vision.  Respiratory:  Negative for shortness of breath.   Cardiovascular:  Negative for chest pain.  Gastrointestinal:  Negative for abdominal pain, nausea and vomiting.  Genitourinary:  Negative for dysuria.  Musculoskeletal:  Positive for joint pain (R hip, hx of THR).  Skin:  Negative for rash.  Neurological:  Positive for speech change and weakness. Negative for dizziness and headaches.   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  ER/PR NEGATIVE   Cataract    Chronic cough    Complication of anesthesia    20 years ago pts b/p dropped really low, "coded"   Depression    Depression    Diabetes mellitus without complication (HCC)    diet controlled   Family history of adverse reaction to anesthesia    son PONV   GERD (gastroesophageal reflux disease)    Gout    Hypercholesteremia    Hyperlipidemia    Hypertension    Melanoma (HCC)    MVP (mitral valve prolapse)    followed by PCP   Nephritis    Nephritis    S/P appendectomy    Thoracic aortic aneurysm (HCC)    Torn meniscus    left   Past Surgical History:  Procedure Laterality Date   ABDOMINAL HYSTERECTOMY     APPENDECTOMY     BREAST BIOPSY Right 04/25/2017   retroareolar 10:00   wing clip   path pending   BREAST BIOPSY Right 04/25/2017   10:00  5CMFN  venus clip    path pending   BREAST BIOPSY Right 05/03/2017   Affirm Bx- path pending   BREAST CYST ASPIRATION Bilateral    NEG   BREAST EXCISIONAL BIOPSY Left 20+ yrs ago   NEG   BREAST RECONSTRUCTION WITH PLACEMENT OF TISSUE EXPANDER AND FLEX HD (ACELLULAR HYDRATED DERMIS) Right 06/03/2017   Procedure: BREAST RECONSTRUCTION WITH PLACEMENT OF TISSUE EXPANDER AND  FLEX HD (ACELLULAR HYDRATED DERMIS);  Surgeon: Peggye Form, DO;  Location: [facility];  Service: Plastics;  Laterality: Right;   BREAST REDUCTION SURGERY Left 11/06/2017   Procedure: BREAST REDUCTION;  Surgeon: Peggye Form, DO;  Location: [facility];  Service: Plastics;  Laterality: Left;   BROW LIFT Bilateral 02/01/2015   Procedure: BLEPHAROPLASTY bilateral upper eyelids.;  Surgeon: Imagene Riches, MD;  Location: [facility];  Service: Ophthalmology;  Laterality: Bilateral;  DIABETIC - diet controlled   CATARACT EXTRACTION W/PHACO Left 01/14/2019   Procedure: CATARACT EXTRACTION PHACO AND  INTRAOCULAR LENS PLACEMENT (IOC) LEFT TECNIS TORIC ADD 5.62 00:46.5 30.0%;  Surgeon: Lockie Mola, MD;  Location: [facility];  Service: Ophthalmology;  Laterality: Left;  diabetic - diet controlled   CATARACT EXTRACTION W/PHACO Right 02/11/2019   Procedure: CATARACT EXTRACTION PHACO AND INTRAOCULAR LENS PLACEMENT (IOC) RIGHT DIABETIC 4.82 00:46.9 10.3%;  Surgeon: Lockie Mola, MD;  Location: [facility];  Service: Ophthalmology;  Laterality: Right;  Diabetic - diet controlled   CHOLECYSTECTOMY     COLONOSCOPY     COLONOSCOPY WITH PROPOFOL N/A 05/11/2015   Procedure: COLONOSCOPY WITH PROPOFOL;  Surgeon: Scot Jun, MD;  Location: [facility];  Service: Endoscopy;  Laterality: N/A;   COLONOSCOPY WITH PROPOFOL N/A 09/07/2020   Procedure: COLONOSCOPY WITH PROPOFOL;  Surgeon: Toledo, Boykin Nearing, MD;  Location: [facility];  Service: Gastroenterology;  Laterality: N/A;   ESOPHAGOGASTRODUODENOSCOPY     ESOPHAGOGASTRODUODENOSCOPY (EGD) WITH PROPOFOL  05/11/2015   Procedure: ESOPHAGOGASTRODUODENOSCOPY (EGD) WITH PROPOFOL;  Surgeon: Scot Jun, MD;  Location: [facility];  Service: Endoscopy;;   FRACTURE SURGERY Right    arm and shoulder   JOINT REPLACEMENT     KNEE ARTHROSCOPY Left 03/14/2016   Procedure: ARTHROSCOPY KNEE, PARTIAL MEDIAL MENISECTOMY, CHONDROPLASTY;  Surgeon: Donato Heinz, MD;  Location: [facility];  Service: Orthopedics;  Laterality: Left;   MASTECTOMY Right 2019   MASTECTOMY W/ SENTINEL NODE BIOPSY Right 06/03/2017   Procedure: MASTECTOMY WITH SENTINEL LYMPH NODE BIOPSY;  Surgeon: Earline Mayotte, MD;  Location: [facility];  Service: General;  Laterality: Right;   PARTIAL HIP ARTHROPLASTY Right    REDUCTION MAMMAPLASTY Left 2019   REMOVAL OF BILATERAL TISSUE EXPANDERS WITH PLACEMENT OF BILATERAL BREAST IMPLANTS Right 11/06/2017   Procedure: REMOVAL OF TISSUE EXPANDER WITH PLACEMENT OF BREAST IMPLANT;  Surgeon: Peggye Form, DO;   Location: [facility];  Service: Plastics;  Laterality: Right;   TONSILLECTOMY     Family History  Problem Relation Age of Onset   Cancer Mother        colon/rectal   Colon cancer Mother    Heart disease Father    Hypertension Father    Atrial fibrillation Father    Diabetes Father    Cancer Brother        lung   Healthy Son    Healthy Son    Breast cancer Neg Hx    Social History:  reports that she quit smoking about 60 years ago. Her smoking use included cigarettes. She started smoking about 62 years ago. She has never used smokeless tobacco. She reports current alcohol use of about 7.0 - 14.0 standard drinks of alcohol per week. She reports that she does not use drugs. Allergies:  Allergies  Allergen Reactions   Codeine Other (See Comments)    Chest pain   Quinolones     PATIENT UNAWARE OF allergy to this class of antibiotics, only aware of allergy to [facility] Patient  was warned about not using Cipro and similar antibiotics. Recent studies have raised concern that fluoroquinolone antibiotics could be associated with an increased risk of aortic aneurysm Fluoroquinolones have non-antimicrobial properties that might jeopardise the integrity of the extracellular matrix of the vascular wall In a  propensity score matched cohort study in [place], there was a 66% increased rate   Aleve [Naproxen Sodium] Hives, Rash and Other (See Comments)    headaches   Lorazepam Other (See Comments)    "Feels loopy"   Oxycodone Other (See Comments)    MENTAL CHANGES   Paregoric Other (See Comments)    Chest pain---tolerated MORPHINE   Penicillins Hives    Can take Augmentin   Medications Prior to Admission  Medication Sig Dispense Refill   acetaminophen (TYLENOL) 500 MG tablet Take 500 mg by mouth every 6 (six) hours as needed.     albuterol (VENTOLIN HFA) 108 (90 Base) MCG/ACT inhaler TAKE 2 PUFFS BY MOUTH EVERY 6 HOURS AS NEEDED FOR WHEEZE OR SHORTNESS OF BREATH 8.5 each 2   allopurinol  (ZYLOPRIM) 100 MG tablet TAKE 2 TABLETS BY MOUTH EVERY DAY 180 tablet 1   ARIPiprazole (ABILIFY) 2 MG tablet TAKE 1 TABLET BY MOUTH EVERY DAY 90 tablet 1   aspirin 81 MG tablet Take 81 mg by mouth daily.     bismuth subsalicylate (PEPTO BISMOL) 262 MG chewable tablet Chew 262 mg by mouth as needed. **capsule**     calcium carbonate (TUMS - DOSED IN MG ELEMENTAL CALCIUM) 500 MG chewable tablet Chew 1 tablet by mouth as needed for indigestion or heartburn.     carboxymethylcellulose (REFRESH PLUS) 0.5 % SOLN Place 1 drop into both eyes 3 (three) times daily as needed (Dry Eyes).     carvedilol (COREG) 6.25 MG tablet Take 1 tablet (6.25 mg total) by mouth 2 (two) times daily. 180 tablet 3   Cholecalciferol (VITAMIN D-3 PO) Take 1 tablet by mouth daily.     colestipol (COLESTID) 1 g tablet TAKE 1 TABLET BY MOUTH 2 TIMES DAILY. 180 tablet 5   ferrous sulfate 324 MG TBEC Take 324 mg by mouth daily with breakfast.     fluocinonide (LIDEX) 0.05 % external solution APPLY TO AFFECTED AREAS OF SCALP DAILY UNTIL CLEAR, THEN FOR FLARES     furosemide (LASIX) 20 MG tablet TAKE 1 TABLET BY MOUTH EVERY DAY 90 tablet 3   lisinopril (ZESTRIL) 10 MG tablet Take 1 tablet (10 mg total) by mouth daily. 90 tablet 1   Multiple Vitamins-Minerals (CENTRUM SILVER PO) Take 1 tablet by mouth daily.     omeprazole (PRILOSEC) 40 MG capsule TAKE 1 CAPSULE BY MOUTH TWICE A DAY 180 capsule 0   oxybutynin (DITROPAN-XL) 10 MG 24 hr tablet TAKE 1 TABLET BY MOUTH EVERY DAY 90 tablet 1   potassium chloride (KLOR-CON) 10 MEQ tablet TAKE 1 TABLET BY MOUTH EVERY DAY 90 tablet 3   tirzepatide (MOUNJARO) 2.5 MG/0.5ML Pen Inject 2.5 mg into the skin once a week. 2 mL 0   Fluticasone-Umeclidin-Vilant (TRELEGY ELLIPTA) 100-62.5-25 MCG/ACT AEPB Inhale 1 puff into the lungs daily. (Patient not taking: Reported on 02/26/2023) 60 each 11   tirzepatide (MOUNJARO) 5 MG/0.5ML Pen Inject 5 mg into the skin once a week. (Patient not taking: Reported on  02/26/2023) 6 mL 1    Home: Home Living Family/patient expects to be discharged to:: Private residence Living Arrangements: Other (Comment) (ILF [place] [facility] for 6 years with spouse) Available Help at Discharge:  (spouse  24/7) Type of Home: Independent living facility [facility] for 6 years) Home Access: Level entry Home Layout: One level Bathroom Shower/Tub: Health visitor: Handicapped height Bathroom Accessibility: Yes Home Equipment: Medical laboratory scientific officer - single point, Information systems manager, Grab bars - tub/shower, Grab bars - toilet, BSC/3in1 Additional Comments: Husband has mild dementia--still able to care for himself and drive  Lives With: Spouse  Functional History: Prior Function Prior Level of Function : Independent/Modified Independent, Driving Mobility Comments: no use of AD prior to admission ADLs Comments: independent Functional Status:  Mobility: Bed Mobility Overal bed mobility: Modified Independent Transfers Overall transfer level: Needs assistance Equipment used: None Transfers: Bed to chair/wheelchair/BSC Sit to Stand: Contact guard assist Bed to/from chair/wheelchair/BSC transfer type:: Step pivot Step pivot transfers: Min assist General transfer comment: CGA sit<>stand with Min A stand step to recliner from bed with one hand A Ambulation/Gait Ambulation/Gait assistance: Contact guard assist Gait Distance (Feet): 25 Feet Assistive device: Rolling walker (2 wheels) Gait Pattern/deviations: Step-to pattern, Ataxic, Scissoring, Narrow base of support General Gait Details: Patient demonstrates poor coordination of R LE, poor foot placement, narrow base of support, almost scissoring of R LE, R foot also wants to roll in. Gait velocity: decr    ADL: ADL Overall ADL's : Needs assistance/impaired Eating/Feeding: Sitting, Set up Eating/Feeding Details (indicate cue type and reason): increaesd time and effort due to decreased use of RUE Grooming: Set up,  Sitting Grooming Details (indicate cue type and reason): increaesd time and effort due to decreased use of RUE Upper Body Bathing: Set up, Sitting Upper Body Bathing Details (indicate cue type and reason): increaesd time and effort due to decreased use of RUE Lower Body Bathing: Contact guard assist, Sit to/from stand Lower Body Bathing Details (indicate cue type and reason): increaesd time and effort due to decreased use of RUE Upper Body Dressing : Set up, Sitting Upper Body Dressing Details (indicate cue type and reason): increaesd time and effort due to decreased use of RUE Lower Body Dressing: Contact guard assist, Sit to/from stand Lower Body Dressing Details (indicate cue type and reason): increaesd time and effort due to decreased use of RUE; pt does not normally wear socks anytime of the year Toilet Transfer: Minimal assistance, Ambulation Toilet Transfer Details (indicate cue type and reason): simulated by steps at bedside Toileting- Clothing Manipulation and Hygiene: Minimal assistance Toileting - Clothing Manipulation Details (indicate cue type and reason): increaesd time and effort due to decreased use of RUE; CGA sit<>stand  Cognition: Cognition Orientation Level: Oriented X4 Cognition Arousal: Alert Behavior During Therapy: [facility] for tasks assessed/performed  Blood pressure 127/83, pulse 70, temperature 98.2 F (36.8 C), temperature source Oral, resp. rate 17, SpO2 94%. Physical Exam   General: No apparent distress HEENT: Head is normocephalic, atraumatic, sclera anicteric, oral mucosa dry, dentition intact Neck: Supple without JVD or lymphadenopathy Heart: Reg rate and rhythm.  Chest: CTA bilaterally without wheezes, rales, or rhonchi; no distress Abdomen: Soft, non-tender, non-distended, bowel sounds positive. Extremities: No clubbing, cyanosis, or edema. Pulses are 2+ Psych: Pt's affect is appropriate. Pt is cooperative Skin: Clean and intact without signs of  breakdown Neuro:    Mental Status: AAOx4, memory intact, fund of knowledge appropriate Speech/Languate: Naming and repetition intact, fluent, follows simple commands, mild word finding deficits and slightly slurred speech.  This may be related to sedation from recent procedure CRANIAL NERVES: II: PERRL-left pupil slightly larger. Visual fields full III, IV, VI: EOM intact, no gaze preference or deviation V: normal sensation  bilaterally VII: Slight right facial droop VIII: normal hearing to speech IX, X: normal palatal elevation XI: 5/5 head turn and 5/5 shoulder shrug bilaterally XII: Tongue midline   MOTOR: RUE: 4/5 Deltoid, 4/5 Biceps, 4/5 Triceps,4/5 Grip LUE: 5/5 Deltoid, 5/5 Biceps, 5/5 Triceps, 5/5 Grip RLE: HF 4-/5, KE 4-/5, ADF 4-/5, APF 4-/5 LLE: Not fully tested due to recent vascular procedure ADF 4/5, APF 4/5   REFLEXES: No ankle clonus  SENSORY: Normal to touch all 4 extremities  Coordination: Finger-nose altered in right upper extremity but appears proportional to weakness    Results for orders placed or performed during the hospital encounter of 02/25/23 (from the past 24 hours)  Glucose, capillary     Status: Abnormal   Collection Time: 02/26/23 12:17 PM  Result Value Ref Range   Glucose-Capillary 118 (H) 70 - 99 mg/dL  Glucose, capillary     Status: Abnormal   Collection Time: 02/26/23  4:43 PM  Result Value Ref Range   Glucose-Capillary 114 (H) 70 - 99 mg/dL  Glucose, capillary     Status: Abnormal   Collection Time: 02/26/23  9:10 PM  Result Value Ref Range   Glucose-Capillary 125 (H) 70 - 99 mg/dL  CBC     Status: None   Collection Time: 02/27/23  3:34 AM  Result Value Ref Range   WBC 8.4 4.0 - 10.5 K/uL   RBC 4.23 3.87 - 5.11 MIL/uL   Hemoglobin 12.5 12.0 - 15.0 g/dL   HCT 34.7 42.5 - 95.6 %   MCV 87.7 80.0 - 100.0 fL   MCH 29.6 26.0 - 34.0 pg   MCHC 33.7 30.0 - 36.0 g/dL   RDW 38.7 56.4 - 33.2 %   Platelets 269 150 - 400 K/uL   nRBC 0.0  0.0 - 0.2 %  Basic metabolic panel     Status: Abnormal   Collection Time: 02/27/23  3:34 AM  Result Value Ref Range   Sodium 140 135 - 145 mmol/L   Potassium 3.5 3.5 - 5.1 mmol/L   Chloride 101 98 - 111 mmol/L   CO2 24 22 - 32 mmol/L   Glucose, Bld 131 (H) 70 - 99 mg/dL   BUN 17 8 - 23 mg/dL   Creatinine, Ser 9.51 0.44 - 1.00 mg/dL   Calcium 9.4 8.9 - 88.4 mg/dL   GFR, Estimated >16 >60 mL/min   Anion gap 15 5 - 15  Glucose, capillary     Status: Abnormal   Collection Time: 02/27/23  6:35 AM  Result Value Ref Range   Glucose-Capillary 115 (H) 70 - 99 mg/dL   ECHOCARDIOGRAM COMPLETE Result Date: 02/26/2023    ECHOCARDIOGRAM REPORT   Patient Name:   SOLEIA BADOLATO Date of Exam: 02/26/2023 Medical Rec #:  [id number]         Height:       64.0 in Accession #:    [id number]        Weight:       196.0 lb Date of Birth:  17-Jul-1940          BSA:          1.940 m Patient Age:    82 years          BP:           174/94 mmHg Patient Gender: F                 HR:  72 bpm. Exam Location:  Inpatient Procedure: 2D Echo, Cardiac Doppler, Color Doppler, Saline Contrast Bubble Study            and Intracardiac Opacification Agent Indications:    Stroke  History:        Patient has prior history of Echocardiogram examinations, most                 recent 09/05/2022. Risk Factors:Hypertension, Diabetes,                 Dyslipidemia and Former Smoker.  Sonographer:    Karma Ganja Referring Phys: [id number] PROSPER M AMPONSAH  Sonographer Comments: Technically difficult study due to poor echo windows and patient is obese. IMPRESSIONS  1. Left ventricular ejection fraction, by estimation, is 55 to 60%. The left ventricle has normal function. The left ventricle has no regional wall motion abnormalities. There is mild asymmetric left ventricular hypertrophy of the septal segment. Left ventricular diastolic parameters are consistent with Grade I diastolic dysfunction (impaired relaxation).  2. Right ventricular  systolic function is normal. The right ventricular size is normal. There is normal pulmonary artery systolic pressure. The estimated right ventricular systolic pressure is 30.9 mmHg.  3. The mitral valve is grossly normal. No evidence of mitral valve regurgitation. No evidence of mitral stenosis.  4. The aortic valve is tricuspid. There is mild calcification of the aortic valve. There is mild thickening of the aortic valve. Aortic valve regurgitation is not visualized. Aortic valve sclerosis is present, with no evidence of aortic valve stenosis.  5. The inferior vena cava is normal in size with greater than 50% respiratory variability, suggesting right atrial pressure of 3 mmHg.  6. Agitated saline contrast bubble study was negative, with no evidence of any interatrial shunt. Comparison(s): No significant change from prior study. Conclusion(s)/Recommendation(s): No intracardiac source of embolism detected on this transthoracic study. Consider a transesophageal echocardiogram to exclude cardiac source of embolism if clinically indicated. FINDINGS  Left Ventricle: Left ventricular ejection fraction, by estimation, is 55 to 60%. The left ventricle has normal function. The left ventricle has no regional wall motion abnormalities. Definity contrast agent was given IV to delineate the left ventricular  endocardial borders. The left ventricular internal cavity size was normal in size. There is mild asymmetric left ventricular hypertrophy of the septal segment. Left ventricular diastolic parameters are consistent with Grade I diastolic dysfunction (impaired relaxation). Right Ventricle: The right ventricular size is normal. No increase in right ventricular wall thickness. Right ventricular systolic function is normal. There is normal pulmonary artery systolic pressure. The tricuspid regurgitant velocity is 2.64 m/s, and  with an assumed right atrial pressure of 3 mmHg, the estimated right ventricular systolic pressure is  30.9 mmHg. Left Atrium: Left atrial size was normal in size. Right Atrium: Right atrial size was normal in size. Pericardium: There is no evidence of pericardial effusion. Presence of epicardial fat layer. Mitral Valve: The mitral valve is grossly normal. No evidence of mitral valve regurgitation. No evidence of mitral valve stenosis. Tricuspid Valve: The tricuspid valve is grossly normal. Tricuspid valve regurgitation is trivial. No evidence of tricuspid stenosis. Aortic Valve: The aortic valve is tricuspid. There is mild calcification of the aortic valve. There is mild thickening of the aortic valve. Aortic valve regurgitation is not visualized. Aortic valve sclerosis is present, with no evidence of aortic valve stenosis. Aortic valve mean gradient measures 6.0 mmHg. Aortic valve peak gradient measures 10.9 mmHg. Aortic valve area, by VTI measures 2.64  cm. Pulmonic Valve: The pulmonic valve was grossly normal. Pulmonic valve regurgitation is not visualized. No evidence of pulmonic stenosis. Aorta: The aortic root is normal in size and structure. Venous: The inferior vena cava is normal in size with greater than 50% respiratory variability, suggesting right atrial pressure of 3 mmHg. IAS/Shunts: The atrial septum is grossly normal. Agitated saline contrast was given intravenously to evaluate for intracardiac shunting. Agitated saline contrast bubble study was negative, with no evidence of any interatrial shunt.  LEFT VENTRICLE PLAX 2D LVIDd:         4.10 cm     Diastology LVIDs:         2.70 cm     LV e' medial:    5.77 cm/s LV PW:         0.90 cm     LV E/e' medial:  12.9 LV IVS:        1.20 cm     LV e' lateral:   9.68 cm/s LVOT diam:     2.10 cm     LV E/e' lateral: 7.7 LV SV:         81 LV SV Index:   42 LVOT Area:     3.46 cm  LV Volumes (MOD) LV vol d, MOD A2C: 83.3 ml LV vol d, MOD A4C: 63.6 ml LV vol s, MOD A2C: 30.2 ml LV vol s, MOD A4C: 36.0 ml LV SV MOD A2C:     53.1 ml LV SV MOD A4C:     63.6 ml LV SV  MOD BP:      38.7 ml RIGHT VENTRICLE             IVC RV Basal diam:  3.60 cm     IVC diam: 1.60 cm RV S prime:     14.30 cm/s TAPSE (M-mode): 2.6 cm LEFT ATRIUM             Index        RIGHT ATRIUM           Index LA diam:        3.90 cm 2.01 cm/m   RA Area:     17.60 cm LA Vol (A2C):   94.1 ml 48.51 ml/m  RA Volume:   46.90 ml  24.18 ml/m LA Vol (A4C):   43.1 ml 22.22 ml/m LA Biplane Vol: 62.9 ml 32.43 ml/m  AORTIC VALVE AV Area (Vmax):    2.62 cm AV Area (Vmean):   2.65 cm AV Area (VTI):     2.64 cm AV Vmax:           165.00 cm/s AV Vmean:          111.000 cm/s AV VTI:            0.306 m AV Peak Grad:      10.9 mmHg AV Mean Grad:      6.0 mmHg LVOT Vmax:         125.00 cm/s LVOT Vmean:        84.900 cm/s LVOT VTI:          0.233 m LVOT/AV VTI ratio: 0.76  AORTA Ao Root diam: 3.50 cm Ao Asc diam:  3.30 cm MITRAL VALVE               TRICUSPID VALVE MV Area (PHT): 2.93 cm    TR Peak grad:   27.9 mmHg MV Decel Time: 259 msec    TR Vmax:  264.00 cm/s MV E velocity: 74.40 cm/s MV A velocity: 89.60 cm/s  SHUNTS MV E/A ratio:  0.83        Systemic VTI:  0.23 m                            Systemic Diam: 2.10 cm Lennie Odor MD Electronically signed by Lennie Odor MD Signature Date/Time: 02/26/2023/10:56:16 AM    Final    DG HIP UNILAT WITH PELVIS 1V RIGHT Result Date: 02/26/2023 CLINICAL DATA:  83 year old female with history of right-sided hip pain. EXAM: DG HIP (WITH OR WITHOUT PELVIS) 1V RIGHT COMPARISON:  No priors. FINDINGS: AP view of the bony pelvis and AP view of the right hip demonstrate no definite acute displaced fracture of the bony pelvic ring. Bilateral proximal femurs as visualized appear intact. Patient is status post right hip hemiarthroplasty. Prosthetic femoral head projects within the acetabulum on this single view examination. No definite periprosthetic fracture or other acute abnormality. Iodinated contrast material is noted within the lumen of the urinary bladder, presumably  related to recent contrast-enhanced CT examinations. IMPRESSION: 1. No acute radiographic abnormality of the bony pelvis or the right hip. 2. Status post right hip hemiarthroplasty. Electronically Signed   By: Trudie Reed M.D.   On: 02/26/2023 06:12   MR BRAIN WO CONTRAST Result Date: 02/25/2023 CLINICAL DATA:  Neuro deficit, acute, stroke suspected. Right-sided weakness. EXAM: MRI HEAD WITHOUT CONTRAST TECHNIQUE: Multiplanar, multiecho pulse sequences of the brain and surrounding structures were obtained without intravenous contrast. COMPARISON:  CT studies same day. FINDINGS: Brain: Diffusion imaging shows acute infarction within the left midbrain and left cerebral peduncle. No other acute infarction. 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 No focal cerebellar insult. Cerebral hemispheres otherwise show mild chronic small-vessel ischemic change of the white matter. No large vessel territory stroke. No mass lesion, hemorrhage, hydrocephalus or extra-axial collection. Vascular: Major vessels at the base of the brain show flow. Skull and upper cervical spine: Negative Sinuses/Orbits: Clear/normal Other: None IMPRESSION: 1. 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 2. 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 3. Mild chronic small-vessel ischemic change of the cerebral hemispheric white matter. Electronically Signed   By: Paulina Fusi M.D.   On: 02/25/2023 19:55   CT ANGIO HEAD NECK W WO CM Result Date: 02/25/2023 CLINICAL DATA:  Neuro deficit, acute, stroke suspected. Extremity weakness. Facial droop. Hypertension and diabetes. EXAM: CT ANGIOGRAPHY HEAD AND NECK WITH AND WITHOUT CONTRAST TECHNIQUE: Multidetector CT imaging of the head and neck was performed using the standard protocol during bolus administration of intravenous contrast. Multiplanar CT image reconstructions and MIPs were obtained to evaluate the vascular anatomy. Carotid stenosis measurements  (when applicable) are obtained utilizing NASCET criteria, using the distal internal carotid diameter as the denominator. RADIATION DOSE REDUCTION: This exam was performed according to the departmental dose-optimization program which includes automated exposure control, adjustment of the mA and/or kV according to patient size and/or use of iterative reconstruction technique. CONTRAST:  60mL OMNIPAQUE IOHEXOL 350 MG/ML SOLN COMPARISON:  Head CT earlier same day.  Ultrasound 04/28/2021. FINDINGS: CTA NECK FINDINGS Aortic arch: Aortic atherosclerosis. Branching pattern is normal without origin stenosis. Right carotid system: Common carotid artery widely patent to the bifurcation. No soft or calcified plaque at the bifurcation. Right ICA does not show any focal stenosis but is smaller than the left. Left carotid system: Common carotid artery widely patent to  the bifurcation. Calcified plaque at the carotid bifurcation and ICA bulb. Minimal diameter in the ICA bulb less than 1 mm. Near occlusion. Vessel regains a more normal caliber at the distal bulb and is patent through the cervical region to the skull base beyond that. Vertebral arteries: Both vertebral artery origins widely patent. Both vertebral arteries patent through the cervical region to the foramen magnum. Skeleton: Chronic mid cervical spondylosis. Other neck: No mass or lymphadenopathy. Upper chest: Scarring at the lung apices.  No acute finding. Review of the MIP images confirms the above findings CTA HEAD FINDINGS Anterior circulation: Both internal carotid arteries are patent through the skull base and siphon regions. Ordinary siphon atherosclerotic calcification but no stenosis greater than 30%. The anterior and middle cerebral vessels are patent. Both anterior cerebral arteries receive there supply from the left carotid circulation. Right internal carotid artery supplies only the middle cerebral artery territory. No large vessel occlusion. No proximal  stenosis. No aneurysm or vascular malformation. Posterior circulation: Both vertebral arteries patent through the foramen magnum to the basilar artery. No basilar stenosis. Posterior circulation branch vessels are patent. Left PCA receives most of it supply from the anterior circulation. Venous sinuses: Patent and normal. Anatomic variants: None significant otherwise. Review of the MIP images confirms the above findings IMPRESSION: 1. Aortic atherosclerosis. 2. Calcified plaque at the left carotid bifurcation and ICA bulb. Minimal diameter in the ICA bulb less than 1 mm. Near occlusion. Vessel regains a more normal caliber at the distal bulb and is patent through the cervical region to the skull base beyond that. This is of particular importance in this patient as the left internal carotid artery supplies the majority of the brain, as the right ICA essentially gives isolated supply to the right middle cerebral artery territory only. Urgent vascular surgery consultation suggested. 3. No intracranial large or medium vessel occlusion or correctable proximal stenosis. Electronically Signed   By: Paulina Fusi M.D.   On: 02/25/2023 15:13   CT Head Wo Contrast Result Date: 02/25/2023 CLINICAL DATA:  Neuro deficit, acute, stroke suspected EXAM: CT HEAD WITHOUT CONTRAST TECHNIQUE: Contiguous axial images were obtained from the base of the skull through the vertex without intravenous contrast. RADIATION DOSE REDUCTION: This exam was performed according to the departmental dose-optimization program which includes automated exposure control, adjustment of the mA and/or kV according to patient size and/or use of iterative reconstruction technique. COMPARISON:  None Available. FINDINGS: Brain: No hemorrhage. No hydrocephalus. No extra-axial fluid collection. No mass effect. No mass lesion. 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 Skull:  Normal. Negative for fracture or focal lesion. Sinuses/Orbits: No middle ear or mastoid effusion. Paranasal sinuses are clear. Bilateral lens replacement. Orbits are otherwise unremarkable. Other: None. 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 Consider further evaluation with MRI. Electronically Signed   By: Lorenza Cambridge M.D.   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 Yes Co-Morbidities requiring supervision/potential complications:  -Severe left ICA stenosis, hypertension, hyperlipidemia, diabetes mellitus type 2 non-insulin-dependent, dysphagia, R hip pin  Due to bladder management, bowel management, safety, skin/wound care, disease management, medication administration, pain management, and patient education, does the patient require 24 hr/day rehab nursing? Yes Does the patient  require coordinated care of a physician, rehab nurse, therapy disciplines of PT/OT/SLP to address physical and functional deficits in the context of the above medical diagnosis(es)? Yes Addressing deficits in the following areas: balance, endurance, locomotion, strength, transferring, bowel/bladder control, bathing, dressing, feeding, grooming, toileting, and psychosocial support Can the patient actively participate in an intensive therapy program of at least 3 hrs of therapy per day at least 5 days per week? Yes The potential for patient to make measurable gains while on inpatient rehab is excellent Anticipated functional outcomes upon discharge from inpatient rehab are modified independent  with PT, modified independent with OT, modified independent with SLP. Estimated rehab length of stay to reach the above functional goals is: 5-7 Anticipated discharge destination:  Home Overall Rehab/Functional Prognosis: excellent  POST ACUTE RECOMMENDATIONS: This patient's condition is appropriate for continued rehabilitative care in the following setting: CIR Patient has agreed to participate in recommended program. Yes Note that insurance prior authorization may be required for reimbursement for recommended care.  Comment: I think patient would be a good candidate for CIR.  Rehab coordinator to follow-up.    I have personally performed a face to face diagnostic evaluation of this patient. Additionally, I have examined the patient's medical record including any pertinent labs and radiographic images.    Thanks,  Fanny Dance, MD 02/27/2023

## 2023-02-27 NOTE — Procedures (Signed)
Modified Barium Swallow Study  Patient Details  Name: Natasha Vasquez MRN: [id number] Date of Birth: [birth year]  Today's Date: 02/27/2023  Modified Barium Swallow completed.  Full report located under Chart Review in the Imaging Section.  History of Present Illness Kareem Valma Rotenberg is a 83 y.o. female presented to the [facility] ED 2/10 for evaluation of right-sided weakness and balance problems. 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 Patient transferred to [facility] same day for potential arteriogram for 02/26/23. PHMx: HTN, T2DM, HLD, depression, OSA with CPAP, osteoarthritis, ascending thoracic aortic aneurysm, asthma and GERD, Barretts esophagus. BSE 2/10 at [facility] revealed s/s pharyngeal dysphagia and recommended Dys 3, nectar thick liquids with right head turn until MBS can be performed.   Clinical Impression Patient presents with a pharyngeal phase dysphagia as per this MBS. Swallow was initated at level of pyriform sinus with liquids and vallecular sinus with puree solids. Nectar thick liquids via cup sips with head in neutral position resulted in shallow penetration (PAS) but no aspiration. With thin liquids, she had consistent, deep penetration to vocal cords (PAS 4) with cup sips. This led to one incident of silent aspiration (PAS 8) of cup sip of thin liquids. Chin tuck posture with cup and straw sips of thin liquids resulted in reduction of depth of penetration and prevented aspiration. 13mm barium tablet taken with thin liquids with chin tuck posture, resulted in tablet becoming lodged in vallecular sinus. Puree solids helped to transit tablet fully. No significant amount of residuals observed s/p initial swallows with any of the tested consistencies. 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 SLP recommending regular solids, thin liquids with chin  tuck. Recommend medications be taken whole with puree/pudding consistency.   Swallow Evaluation Recommendations Recommendations: PO diet PO Diet Recommendation: Regular;Thin liquids (Level 0) Liquid Administration via: Cup;Straw Medication Administration: Whole meds with puree Supervision: Patient able to self-feed Swallowing strategies  : Slow rate;Small bites/sips;Chin tuck Postural changes: Position pt fully upright for meals Oral care recommendations: Oral care BID (2x/day)    Angela Nevin, MA, CCC-SLP Speech Therapy

## 2023-02-27 NOTE — Plan of Care (Signed)

## 2023-02-27 NOTE — Progress Notes (Signed)
Inpatient Rehabilitation Admissions Coordinator   I have begun insurance Auth with [facility] today. Dr Natale Lay consulted. Authorization pending for CIR admit.  Ottie Glazier, RN, MSN Rehab Admissions Coordinator [phone] 02/27/2023 4:49 PM

## 2023-02-27 NOTE — Progress Notes (Signed)
PROGRESS NOTE    Natasha Vasquez  NWG:[id number] DOB: [birth year] DOA: 02/25/2023 PCP: Erasmo Downer, MD    No chief complaint on file.   Brief Narrative:  Natasha Vasquez is a 83 y.o. 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      Assessment & Plan:   Principal Problem:   CVA (cerebrovascular accident) (HCC) Active Problems:   GERD (gastroesophageal reflux disease)   Hypertension associated with diabetes (HCC)   Type 2 diabetes mellitus (HCC)   Dysphagia   Diabetic peripheral neuropathy associated with type 2 diabetes mellitus (HCC)   HLD (hyperlipidemia)   ICAO (internal carotid artery occlusion), left   Brainstem infarct, acute (HCC)  #1 acute ischemic stroke/left midbrain infarct/acute left cerebral peduncle infarct/asymptomatic high-grade proximal left ICA stenosis -Patient noted to have presented with acute onset of right-sided weakness and balance problems and noted to have evidence of acute infarct in the left midbrain and cerebral peduncle. -CT head done on admission with age-indeterminate superior cerebellar infarct. -CT angiogram head and neck done with calcified plaque at the left carotid bifurcation and left ICA bulb with new occlusion of the left ICA bulb. -MRI brain done with acute infarct in the left midbrain and left cerebral peduncle, old small vessel infarction and cerebral pons, mild small vessel ischemic changes. -2D echo done with a EF of 55 to 60%, mild LVH, grade 1 DD, normal left atrial size, normal atrial septum with no evidence of interarterial shunt noted. -Fasting lipid panel with LDL of 81. -Hemoglobin A1c 6.6. -Patient being followed by neurology who are recommending will need repeat MRI in 3 months  follow-up. -Neurology recommending DAPT with aspirin 81 mg daily and clopidogrel 75 mg daily x 3 weeks then clopidogrel alone. -Patient being followed by PT/OT who are recommending CIR placement. -Will also need outpatient follow-up with neurology.  2.  Severe left ICA stenosis -CT angiogram head and neck with new occlusion of left ICA. -Patient seen in consultation by neurology as well as interventional neurology and patient subsequently underwent right vertebral angiogram and bilateral common carotid arteriograms with severe preocclusive stenosis of the proximal left ICA noted. -Neurology discussed findings with patient and family and per neurology prefer to have revascularization done and before angioplasty stenting or carotid surgery. -Neurology recommending continued ongoing therapy with rehab with elective left carotid revascularization in 2 to 4 weeks with Dr. Corliss Skains.  3.  Hyperlipidemia -Fasting lipid panel with LDL of 81 with goal LDL < 70. -Crestor dose increased to 20 mg daily. -Patient follow-up.  4.  Hypertension -BP currently stable and controlled on Coreg 6.25 mg twice daily.  5.  Well-controlled type 2 diabetes mellitus -Hemoglobin A1c 6.6 (02/11/2023) -CBG noted at 115 this morning. -Noted to be on Mounjaro prior to admission which is currently on hold. -Continue SSI.  6.  Post stroke dysphagia -Being followed by SLP. -Continue current diet.  7.  Right hip pain -Patient with complaints of right hip pain/discomfort intermittent in nature and describes as a spasm/aching. -Patient with history of osteoarthritis. -Patient denies any recent falls. -Hip nontender to palpation. -Plain films of the hip done with no acute abnormalities. -Robaxin as needed.  8.  Asthma -Stable. -Nebs as needed.  9.  OSA -CPAP nightly.    DVT prophylaxis: Lovenox  Code Status: Full Family Communication: Updated patient and son at bedside. Disposition: CIR versus SNF  Status  is: Inpatient Remains inpatient appropriate because: Severity of illness   Consultants:  Neurology: Dr. Selina Cooley 02/25/2023 Neurointerventional radiology: Dr. Corliss Skains 02/26/2023 Inpatient rehab  Procedures:  CT angiogram head and neck 02/25/2023 CT angiogram head and neck 02/25/2023 MRI brain 02/25/2023 2D echo 02/26/2023 Right vertebral angiogram and bilateral common carotid arteriograms.  Neurovascular intervention: Dr.Deveshwar 02/27/2023  Antimicrobials:  Anti-infectives (From admission, onward)    None         Subjective: Patient laying in bed.  Son at bedside.  Just returned from procedure.  Denies any chest pain, no shortness of breath, no abdominal pain.  Complain of some intermittent right hip spasm/pain.  Feels right-sided weakness has improved since admission however still with weakness noted.  Objective: Vitals:   02/27/23 1000 02/27/23 1015 02/27/23 1035 02/27/23 1607  BP: 135/77 127/83 (!) 155/94 126/77  Pulse: 73 70 70 70  Resp: 17  17 17   Temp:   97.7 F (36.5 C) 97.9 F (36.6 C)  TempSrc:   Oral Oral  SpO2: 93% 94% 94% 93%    Intake/Output Summary (Last 24 hours) at 02/27/2023 1902 Last data filed at 02/27/2023 1756 Gross per 24 hour  Intake 903.75 ml  Output --  Net 903.75 ml   There were no vitals filed for this visit.  Examination:  General exam: Appears calm and comfortable  Respiratory system: Clear to auscultation anterior lung fields.  No wheezes, no crackles, no rhonchi.  Fair air movement.  Speaking in full sentences.Marland Kitchen Respiratory effort normal. Cardiovascular system: S1 & S2 heard, RRR. No JVD, murmurs, rubs, gallops or clicks. No pedal edema. Gastrointestinal system: Abdomen is nondistended, soft and nontender. No organomegaly or masses felt. Normal bowel sounds heard. Central nervous system: Alert and oriented.  Some dysarthric speech.  Some right-sided weakness.   Extremities: 3-4/5 RUE strength.  5/5 left upper extremity strength. Skin: No  rashes, lesions or ulcers Psychiatry: Judgement and insight appear normal. Mood & affect appropriate.     Data Reviewed: I have personally reviewed following labs and imaging studies  CBC: Recent Labs  Lab 02/25/23 1113 02/26/23 0317 02/27/23 0334  WBC 7.1 8.9 8.4  NEUTROABS 5.1  --   --   HGB 12.4 12.5 12.5  HCT 38.4 37.8 37.1  MCV 90.8 88.5 87.7  PLT 301 288 269    Basic Metabolic Panel: Recent Labs  Lab 02/25/23 1113 02/26/23 0317 02/27/23 0334  NA 140 141 140  K 3.8 3.6 3.5  CL 105 104 101  CO2 25 24 24   GLUCOSE 135* 107* 131*  BUN 14 10 17   CREATININE 0.86 0.91 0.85  CALCIUM 9.3 9.6 9.4  MG 2.0  --   --     GFR: CrCl cannot be calculated (Unknown ideal weight.).  Liver Function Tests: Recent Labs  Lab 02/25/23 1113  AST 34  ALT 40  ALKPHOS 71  BILITOT 0.6  PROT 7.3  ALBUMIN 4.5    CBG: Recent Labs  Lab 02/26/23 1643 02/26/23 2110 02/27/23 0635 02/27/23 1111 02/27/23 1608  GLUCAP 114* 125* 115* 109* 173*     No results found for this or any previous visit (from the past 240 hours).       Radiology Studies: DG Swallowing Func-Speech Pathology Result Date: 02/27/2023 Table formatting from the original result was not included. Modified Barium Swallow Study Patient Details Name: Addi Pak MRN: [id number] Date of  Birth: 09-29-1940 Today's Date: 02/27/2023 HPI/PMH: HPI: Anayelli Lai is a 83 y.o. female presented to the [facility] ED 2/10 for evaluation of right-sided weakness and balance problems. 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 Patient transferred to [facility] same day for potential arteriogram for 02/26/23. PHMx: HTN, T2DM, HLD, depression, OSA with CPAP, osteoarthritis, ascending thoracic aortic aneurysm, asthma and GERD, Barretts esophagus. BSE 2/10 at [facility] revealed s/s pharyngeal dysphagia and recommended Dys 3, nectar thick liquids with right head turn until MBS can be  performed. Clinical Impression: Clinical Impression: Patient presents with a pharyngeal phase dysphagia as per this MBS. Swallow was initated at level of pyriform sinus with liquids and vallecular sinus with puree solids. Nectar thick liquids via cup sips with head in neutral position resulted in shallow penetration (PAS) but no aspiration. With thin liquids, she had consistent, deep penetration to vocal cords (PAS 4) with cup sips. This led to one incident of silent aspiration (PAS 8) of cup sip of thin liquids. Chin tuck posture with cup and straw sips of thin liquids resulted in reduction of depth of penetration and prevented aspiration. 13mm barium tablet taken with thin liquids with chin tuck posture, resulted in tablet becoming lodged in vallecular sinus. Puree solids helped to transit tablet fully. No significant amount of residuals observed s/p initial swallows with any of the tested consistencies. 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 SLP recommending regular solids, thin liquids with chin tuck. Recommend taking medications whole with puree/pudding texture. Recommendations/Plan: Swallowing Evaluation Recommendations Swallowing Evaluation Recommendations Recommendations: PO diet PO Diet Recommendation: Regular; Thin liquids (Level 0) Liquid Administration via: Cup; Straw Medication Administration: Whole meds with puree Supervision: Patient able to self-feed Swallowing strategies  : Slow rate; Small bites/sips; Chin tuck Postural changes: Position pt fully upright for meals Oral care recommendations: Oral care BID (2x/day) Treatment Plan Treatment Plan Treatment recommendations: Therapy as outlined in treatment plan below Follow-up recommendations: No SLP follow up Functional status assessment: Patient has had a recent decline in their functional status and demonstrates the ability to make significant improvements  in function in a reasonable and predictable amount of time. Treatment frequency: Min 1x/week Treatment duration: 1 week Interventions: Aspiration precaution training; Diet toleration management by SLP; Patient/family education Recommendations Recommendations for follow up therapy are one component of a multi-disciplinary discharge planning process, led by the attending physician.  Recommendations may be updated based on patient status, additional functional criteria and insurance authorization. Assessment: Orofacial Exam: Orofacial Exam Oral Cavity: Oral Hygiene: WFL Oral Cavity - Dentition: Adequate natural dentition Orofacial Anatomy: WFL Anatomy: Anatomy: Prominent cricopharyngeus Boluses Administered: Boluses Administered Boluses Administered: Thin liquids (Level 0); Mildly thick liquids (Level 2, nectar thick); Puree  Oral Impairment Domain: Oral Impairment Domain Lip Closure: No labial escape Tongue control during bolus hold: Cohesive bolus between tongue to palatal seal Bolus preparation/mastication: Timely and efficient chewing and mashing Bolus transport/lingual motion: Brisk tongue motion Oral residue: Complete oral clearance Location of oral residue : N/A Initiation of pharyngeal swallow : Pyriform sinuses; Valleculae  Pharyngeal Impairment Domain: Pharyngeal Impairment Domain Soft palate elevation: No bolus between soft palate (SP)/pharyngeal wall (PW) Laryngeal elevation: Partial superior movement of thyroid cartilage/partial approximation of arytenoids to epiglottic petiole Anterior hyoid excursion: Partial anterior movement Epiglottic movement: Complete inversion Laryngeal vestibule closure: Complete, no air/contrast in laryngeal vestibule Pharyngeal stripping wave : Present - complete Pharyngeal contraction (A/P view  only): N/A Pharyngoesophageal segment opening: Complete distension and complete duration, no obstruction of flow Tongue base retraction: No contrast between tongue base and posterior  pharyngeal wall (PPW) Pharyngeal residue: Complete pharyngeal clearance Location of pharyngeal residue: N/A  Esophageal Impairment Domain: Esophageal Impairment Domain Esophageal clearance upright position: Complete clearance, esophageal coating Pill: Pill Consistency administered: Thin liquids (Level 0); Puree Thin liquids (Level 0): Impaired (see clinical impressions) Puree: WFL Penetration/Aspiration Scale Score: Penetration/Aspiration Scale Score 1.  Material does not enter airway: Puree 2.  Material enters airway, remains ABOVE vocal cords then ejected out: Mildly thick liquids (Level 2, nectar thick); Thin liquids (Level 0) 4.  Material enters airway, CONTACTS cords then ejected out: Thin liquids (Level 0) 8.  Material enters airway, passes BELOW cords without attempt by patient to eject out (silent aspiration) : Thin liquids (Level 0) Compensatory Strategies: Compensatory Strategies Compensatory strategies: Yes Chin tuck: Effective Effective Chin Tuck: Thin liquid (Level 0)   General Information: No data recorded Diet Prior to this Study: Dysphagia 3 (mechanical soft); Mildly thick liquids (Level 2, nectar thick)   Temperature : Normal   Respiratory Status: WFL   Supplemental O2: None (Room air)   History of Recent Intubation: No  Behavior/Cognition: Alert; Cooperative; Pleasant mood Self-Feeding Abilities: Able to self-feed Baseline vocal quality/speech: Normal Volitional Cough: Able to elicit Volitional Swallow: Able to elicit Exam Limitations: No limitations Goal Planning: Prognosis for improved oropharyngeal function: Good No data recorded Barriers/Prognosis Comment: GERD/Barrett's esophagus; bell's palsy baseline No data recorded Consulted and agree with results and recommendations: Patient Pain: Pain Assessment Pain Assessment: No/denies pain Pain Score: 0 Faces Pain Scale: 2 Pain Location: R hip Pain Descriptors / Indicators: Spasm; Discomfort Pain Intervention(s): Monitored during session;  Repositioned End of Session: Start Time:SLP Start Time (ACUTE ONLY): 1430 Stop Time: SLP Stop Time (ACUTE ONLY): 1450 Time Calculation:SLP Time Calculation (min) (ACUTE ONLY): 20 min Charges: SLP Evaluations $ SLP Speech Visit: 1 Visit SLP Evaluations $BSS Swallow: 1 Procedure $MBS Swallow: 1 Procedure $ SLP EVAL LANGUAGE/SOUND PRODUCTION: 1 Procedure SLP visit diagnosis: SLP Visit Diagnosis: Dysphagia, pharyngeal phase (R13.13) Past Medical History: Past Medical History: Diagnosis Date  Anemia in pregnancy   Arthritis   hands  Barrett esophagus   Bell palsy 04/24/2004  Breast neoplasm, Tis (DCIS), 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  ER/PR NEGATIVE  Cataract   Chronic cough   Complication of anesthesia   20 years ago pts b/p dropped really low, "coded"  Depression   Depression   Diabetes mellitus without complication (HCC)   diet controlled  Family history of adverse reaction to anesthesia   son PONV  GERD (gastroesophageal reflux disease)   Gout   Hypercholesteremia   Hyperlipidemia   Hypertension   Melanoma (HCC)   MVP (mitral valve prolapse)   followed by PCP  Nephritis   Nephritis   S/P appendectomy   Thoracic aortic aneurysm (HCC)   Torn meniscus   left Past Surgical History: Past Surgical History: Procedure Laterality Date  ABDOMINAL HYSTERECTOMY    APPENDECTOMY    BREAST BIOPSY Right 04/25/2017  retroareolar 10:00   wing clip   path pending  BREAST BIOPSY Right 04/25/2017  10:00  5CMFN  venus clip    path pending  BREAST BIOPSY Right 05/03/2017  Affirm Bx- path pending  BREAST CYST ASPIRATION Bilateral   NEG  BREAST EXCISIONAL BIOPSY Left 20+ yrs ago  NEG  BREAST RECONSTRUCTION WITH PLACEMENT OF TISSUE EXPANDER AND FLEX HD (ACELLULAR HYDRATED DERMIS) Right  06/03/2017  Procedure: BREAST RECONSTRUCTION WITH PLACEMENT OF TISSUE EXPANDER AND FLEX HD (ACELLULAR HYDRATED DERMIS);  Surgeon: Peggye Form, DO;  Location: [facility] ORS;  Service: Plastics;  Laterality: Right;  BREAST REDUCTION SURGERY Left 11/06/2017   Procedure: BREAST REDUCTION;  Surgeon: Peggye Form, DO;  Location: [facility];  Service: Plastics;  Laterality: Left;  BROW LIFT Bilateral 02/01/2015  Procedure: BLEPHAROPLASTY bilateral upper eyelids.;  Surgeon: Imagene Riches, MD;  Location: [facility];  Service: Ophthalmology;  Laterality: Bilateral;  DIABETIC - diet controlled  CATARACT EXTRACTION W/PHACO Left 01/14/2019  Procedure: CATARACT EXTRACTION PHACO AND INTRAOCULAR LENS PLACEMENT (IOC) LEFT TECNIS TORIC ADD 5.62 00:46.5 30.0%;  Surgeon: Lockie Mola, MD;  Location: [facility];  Service: Ophthalmology;  Laterality: Left;  diabetic - diet controlled  CATARACT EXTRACTION W/PHACO Right 02/11/2019  Procedure: CATARACT EXTRACTION PHACO AND INTRAOCULAR LENS PLACEMENT (IOC) RIGHT DIABETIC 4.82 00:46.9 10.3%;  Surgeon: Lockie Mola, MD;  Location: [facility];  Service: Ophthalmology;  Laterality: Right;  Diabetic - diet controlled  CHOLECYSTECTOMY    COLONOSCOPY    COLONOSCOPY WITH PROPOFOL N/A 05/11/2015  Procedure: COLONOSCOPY WITH PROPOFOL;  Surgeon: Scot Jun, MD;  Location: [facility] END[facility]COPY;  Service: Endoscopy;  Laterality: N/A;  COLONOSCOPY WITH PROPOFOL N/A 09/07/2020  Procedure: COLONOSCOPY WITH PROPOFOL;  Surgeon: Toledo, Boykin Nearing, MD;  Location: [facility] ENDOSCOPY;  Service: Gastroenterology;  Laterality: N/A;  ESOPHAGOGASTRODUODENOSCOPY    ESOPHAGOGASTRODUODENOSCOPY (EGD) WITH PROPOFOL  05/11/2015  Procedure: ESOPHAGOGASTRODUODENOSCOPY (EGD) WITH PROPOFOL;  Surgeon: Scot Jun, MD;  Location: [facility] END[facility]COPY;  Service: Endoscopy;;  FRACTURE SURGERY Right   arm and shoulder  JOINT REPLACEMENT    KNEE ARTHROSCOPY Left 03/14/2016  Procedure: ARTHROSCOPY KNEE, PARTIAL MEDIAL MENISECTOMY, CHONDROPLASTY;  Surgeon: Donato Heinz, MD;  Location: [facility] OR[facility];  Service: Orthopedics;  Laterality: Left;  MASTECTOMY Right 2019  MASTECTOMY W/ SENTINEL NODE BIOPSY Right 06/03/2017  Procedure: MASTECTOMY WITH SENTINEL  LYMPH NODE BIOPSY;  Surgeon: Earline Mayotte, MD;  Location: [facility];  Service: General;  Laterality: Right;  PARTIAL HIP ARTHROPLASTY Right   REDUCTION MAMMAPLASTY Left 2019  REMOVAL OF BILATERAL TISSUE EXPANDERS WITH PLACEMENT OF BILATERAL BREAST IMPLANTS Right 11/06/2017  Procedure: REMOVAL OF TISSUE EXPANDER WITH PLACEMENT OF BREAST IMPLANT;  Surgeon: Peggye Form, DO;  Location: [facility] OR[facility];  Service: Plastics;  Laterality: Right;  TONSILLECTOMY   Angela Nevin, MA, CCC-SLP Speech Therapy   ECHOCARDIOGRAM COMPLETE Result Date: 02/26/2023    ECHOCARDIOGRAM REPORT   Patient Name:   ILMA ACHEE Date of Exam: 02/26/2023 Medical Rec #:  [id number]         Height:       64.0 in Accession #:    [id number]        Weight:       196.0 lb Date of Birth:  1940-10-25          BSA:          1.940 m Patient Age:    82 years          BP:           174/94 mmHg Patient Gender: F                 HR:           72 bpm. Exam Location:  Inpatient Procedure: 2D Echo, Cardiac Doppler, Color Doppler, Saline Contrast Bubble Study            and Intracardiac Opacification Agent Indications:  Stroke  History:        Patient has prior history of Echocardiogram examinations, most                 recent 09/05/2022. Risk Factors:Hypertension, Diabetes,                 Dyslipidemia and Former Smoker.  Sonographer:    Karma Ganja Referring Phys: [id number] PROSPER M AMPONSAH  Sonographer Comments: Technically difficult study due to poor echo windows and patient is obese. IMPRESSIONS  1. Left ventricular ejection fraction, by estimation, is 55 to 60%. The left ventricle has normal function. The left ventricle has no regional wall motion abnormalities. There is mild asymmetric left ventricular hypertrophy of the septal segment. Left ventricular diastolic parameters are consistent with Grade I diastolic dysfunction (impaired relaxation).  2. Right ventricular systolic function is normal. The right ventricular size is normal. There is  normal pulmonary artery systolic pressure. The estimated right ventricular systolic pressure is 30.9 mmHg.  3. The mitral valve is grossly normal. No evidence of mitral valve regurgitation. No evidence of mitral stenosis.  4. The aortic valve is tricuspid. There is mild calcification of the aortic valve. There is mild thickening of the aortic valve. Aortic valve regurgitation is not visualized. Aortic valve sclerosis is present, with no evidence of aortic valve stenosis.  5. The inferior vena cava is normal in size with greater than 50% respiratory variability, suggesting right atrial pressure of 3 mmHg.  6. Agitated saline contrast bubble study was negative, with no evidence of any interatrial shunt. Comparison(s): No significant change from prior study. Conclusion(s)/Recommendation(s): No intracardiac source of embolism detected on this transthoracic study. Consider a transesophageal echocardiogram to exclude cardiac source of embolism if clinically indicated. FINDINGS  Left Ventricle: Left ventricular ejection fraction, by estimation, is 55 to 60%. The left ventricle has normal function. The left ventricle has no regional wall motion abnormalities. Definity contrast agent was given IV to delineate the left ventricular  endocardial borders. The left ventricular internal cavity size was normal in size. There is mild asymmetric left ventricular hypertrophy of the septal segment. Left ventricular diastolic parameters are consistent with Grade I diastolic dysfunction (impaired relaxation). Right Ventricle: The right ventricular size is normal. No increase in right ventricular wall thickness. Right ventricular systolic function is normal. There is normal pulmonary artery systolic pressure. The tricuspid regurgitant velocity is 2.64 m/s, and  with an assumed right atrial pressure of 3 mmHg, the estimated right ventricular systolic pressure is 30.9 mmHg. Left Atrium: Left atrial size was normal in size. Right Atrium:  Right atrial size was normal in size. Pericardium: There is no evidence of pericardial effusion. Presence of epicardial fat layer. Mitral Valve: The mitral valve is grossly normal. No evidence of mitral valve regurgitation. No evidence of mitral valve stenosis. Tricuspid Valve: The tricuspid valve is grossly normal. Tricuspid valve regurgitation is trivial. No evidence of tricuspid stenosis. Aortic Valve: The aortic valve is tricuspid. There is mild calcification of the aortic valve. There is mild thickening of the aortic valve. Aortic valve regurgitation is not visualized. Aortic valve sclerosis is present, with no evidence of aortic valve stenosis. Aortic valve mean gradient measures 6.0 mmHg. Aortic valve peak gradient measures 10.9 mmHg. Aortic valve area, by VTI measures 2.64 cm. Pulmonic Valve: The pulmonic valve was grossly normal. Pulmonic valve regurgitation is not visualized. No evidence of pulmonic stenosis. Aorta: The aortic root is normal in size and structure. Venous: The inferior vena cava is  normal in size with greater than 50% respiratory variability, suggesting right atrial pressure of 3 mmHg. IAS/Shunts: The atrial septum is grossly normal. Agitated saline contrast was given intravenously to evaluate for intracardiac shunting. Agitated saline contrast bubble study was negative, with no evidence of any interatrial shunt.  LEFT VENTRICLE PLAX 2D LVIDd:         4.10 cm     Diastology LVIDs:         2.70 cm     LV e' medial:    5.77 cm/s LV PW:         0.90 cm     LV E/e' medial:  12.9 LV IVS:        1.20 cm     LV e' lateral:   9.68 cm/s LVOT diam:     2.10 cm     LV E/e' lateral: 7.7 LV SV:         81 LV SV Index:   42 LVOT Area:     3.46 cm  LV Volumes (MOD) LV vol d, MOD A2C: 83.3 ml LV vol d, MOD A4C: 63.6 ml LV vol s, MOD A2C: 30.2 ml LV vol s, MOD A4C: 36.0 ml LV SV MOD A2C:     53.1 ml LV SV MOD A4C:     63.6 ml LV SV MOD BP:      38.7 ml RIGHT VENTRICLE             IVC RV Basal diam:  3.60  cm     IVC diam: 1.60 cm RV S prime:     14.30 cm/s TAPSE (M-mode): 2.6 cm LEFT ATRIUM             Index        RIGHT ATRIUM           Index LA diam:        3.90 cm 2.01 cm/m   RA Area:     17.60 cm LA Vol (A2C):   94.1 ml 48.51 ml/m  RA Volume:   46.90 ml  24.18 ml/m LA Vol (A4C):   43.1 ml 22.22 ml/m LA Biplane Vol: 62.9 ml 32.43 ml/m  AORTIC VALVE AV Area (Vmax):    2.62 cm AV Area (Vmean):   2.65 cm AV Area (VTI):     2.64 cm AV Vmax:           165.00 cm/s AV Vmean:          111.000 cm/s AV VTI:            0.306 m AV Peak Grad:      10.9 mmHg AV Mean Grad:      6.0 mmHg LVOT Vmax:         125.00 cm/s LVOT Vmean:        84.900 cm/s LVOT VTI:          0.233 m LVOT/AV VTI ratio: 0.76  AORTA Ao Root diam: 3.50 cm Ao Asc diam:  3.30 cm MITRAL VALVE               TRICUSPID VALVE MV Area (PHT): 2.93 cm    TR Peak grad:   27.9 mmHg MV Decel Time: 259 msec    TR Vmax:        264.00 cm/s MV E velocity: 74.40 cm/s MV A velocity: 89.60 cm/s  SHUNTS MV E/A ratio:  0.83        Systemic VTI:  0.23 m  Systemic Diam: 2.10 cm Lennie Odor MD Electronically signed by Lennie Odor MD Signature Date/Time: 02/26/2023/10:56:16 AM    Final    DG HIP UNILAT WITH PELVIS 1V RIGHT Result Date: 02/26/2023 CLINICAL DATA:  83 year old female with history of right-sided hip pain. EXAM: DG HIP (WITH OR WITHOUT PELVIS) 1V RIGHT COMPARISON:  No priors. FINDINGS: AP view of the bony pelvis and AP view of the right hip demonstrate no definite acute displaced fracture of the bony pelvic ring. Bilateral proximal femurs as visualized appear intact. Patient is status post right hip hemiarthroplasty. Prosthetic femoral head projects within the acetabulum on this single view examination. No definite periprosthetic fracture or other acute abnormality. Iodinated contrast material is noted within the lumen of the urinary bladder, presumably related to recent contrast-enhanced CT examinations. IMPRESSION: 1. No acute  radiographic abnormality of the bony pelvis or the right hip. 2. Status post right hip hemiarthroplasty. Electronically Signed   By: Trudie Reed M.D.   On: 02/26/2023 06:12   MR BRAIN WO CONTRAST Result Date: 02/25/2023 CLINICAL DATA:  Neuro deficit, acute, stroke suspected. Right-sided weakness. EXAM: MRI HEAD WITHOUT CONTRAST TECHNIQUE: Multiplanar, multiecho pulse sequences of the brain and surrounding structures were obtained without intravenous contrast. COMPARISON:  CT studies same day. FINDINGS: Brain: Diffusion imaging shows acute infarction within the left midbrain and left cerebral peduncle. No other acute infarction. 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 No focal cerebellar insult. Cerebral hemispheres otherwise show mild chronic small-vessel ischemic change of the white matter. No large vessel territory stroke. No mass lesion, hemorrhage, hydrocephalus or extra-axial collection. Vascular: Major vessels at the base of the brain show flow. Skull and upper cervical spine: Negative Sinuses/Orbits: Clear/normal Other: None IMPRESSION: 1. 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 2. 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 3. Mild chronic small-vessel ischemic change of the cerebral hemispheric white matter. Electronically Signed   By: Paulina Fusi M.D.   On: 02/25/2023 19:55        Scheduled Meds:  allopurinol  200 mg Oral Daily   ARIPiprazole  2 mg Oral Daily   aspirin  81 mg Oral Daily   [START ON 02/28/2023] carvedilol  6.25 mg Oral BID   clopidogrel  75 mg Oral Daily   enoxaparin (LOVENOX) injection  40 mg Subcutaneous Q24H   fluticasone furoate-vilanterol  1 puff Inhalation Daily   And   umeclidinium bromide  1 puff Inhalation Daily   insulin aspart  0-15 Units Subcutaneous TID WC   pantoprazole  40 mg Oral Daily   rosuvastatin  20 mg Oral Daily   venlafaxine XR  225 mg Oral Daily   Continuous Infusions:  sodium chloride        LOS: 2 days    Time spent: 40 minutes    Ramiro Harvest, MD [facility]   To contact the attending provider between 7A-7P or the covering provider during after hours 7P-7A, please log into the web site [email or web address] and access using universal [place] password for that web site. If you do not have the password, please call the hospital operator.  02/27/2023, 7:02 PM

## 2023-02-27 NOTE — Progress Notes (Signed)
Mobility Specialist Progress Note:    02/27/23 1504  Mobility  Activity Dangled on edge of bed  Level of Assistance Contact guard assist, steadying assist  Assistive Device None  Activity Response Tolerated well  Mobility Referral Yes  Mobility visit 1 Mobility  Mobility Specialist Start Time (ACUTE ONLY) 1500  Mobility Specialist Stop Time (ACUTE ONLY) 1504  Mobility Specialist Time Calculation (min) (ACUTE ONLY) 4 min   Pt received supine in bed, just arrived back to room from procedure. Bed rest orders lifted, pt requested to dangle EOB for lunch. Tolerated well, required CGA for safety. Left pt at EOB, son in room, all needs met, call bell in reach.    Feliciana Rossetti Mobility Specialist Please contact via Special educational needs teacher or  Rehab office at [phone]

## 2023-02-28 ENCOUNTER — Encounter: Payer: Self-pay | Admitting: Family Medicine

## 2023-02-28 ENCOUNTER — Ambulatory Visit: Payer: Medicare PPO

## 2023-02-28 DIAGNOSIS — E1159 Type 2 diabetes mellitus with other circulatory complications: Secondary | ICD-10-CM | POA: Diagnosis not present

## 2023-02-28 DIAGNOSIS — I639 Cerebral infarction, unspecified: Secondary | ICD-10-CM | POA: Diagnosis not present

## 2023-02-28 DIAGNOSIS — I6522 Occlusion and stenosis of left carotid artery: Secondary | ICD-10-CM | POA: Diagnosis not present

## 2023-02-28 DIAGNOSIS — E782 Mixed hyperlipidemia: Secondary | ICD-10-CM | POA: Diagnosis not present

## 2023-02-28 LAB — GLUCOSE, CAPILLARY
Glucose-Capillary: 104 mg/dL — ABNORMAL HIGH (ref 70–99)
Glucose-Capillary: 175 mg/dL — ABNORMAL HIGH (ref 70–99)
Glucose-Capillary: 77 mg/dL (ref 70–99)
Glucose-Capillary: 99 mg/dL (ref 70–99)

## 2023-02-28 LAB — BASIC METABOLIC PANEL
Anion gap: 11 (ref 5–15)
BUN: 20 mg/dL (ref 8–23)
CO2: 24 mmol/L (ref 22–32)
Calcium: 9.1 mg/dL (ref 8.9–10.3)
Chloride: 106 mmol/L (ref 98–111)
Creatinine, Ser: 0.95 mg/dL (ref 0.44–1.00)
GFR, Estimated: 60 mL/min — ABNORMAL LOW (ref >60)
Glucose, Bld: 109 mg/dL — ABNORMAL HIGH (ref 70–99)
Potassium: 3.7 mmol/L (ref 3.5–5.1)
Sodium: 141 mmol/L (ref 135–145)

## 2023-02-28 MED ORDER — LISINOPRIL 10 MG PO TABS
10.0000 mg | ORAL_TABLET | Freq: Every day | ORAL | Status: DC
  Administered 2023-02-28 – 2023-03-01 (×2): 10 mg via ORAL
  Filled 2023-02-28 (×2): qty 1

## 2023-02-28 NOTE — Progress Notes (Signed)
Inpatient Rehabilitation Admissions Coordinator   I have received a denial from [facility] after peer to peer with our Rehab MD. PT has upgraded goals to outpatient today. I met with patient , spouse and spoke with son, Vonna Kotyk by phone. We are not pursuing appeal with [employer]. Patient requesting home with HH, not OP. I have notified acute team and TOC.  Ottie Glazier, RN, MSN Rehab Admissions Coordinator [phone] 02/28/2023 1:59 PM

## 2023-02-28 NOTE — Progress Notes (Signed)
Speech Language Pathology Treatment: Dysphagia  Patient Details Name: Natasha Vasquez MRN: [id number] DOB: 1940/07/04 Today's Date: 02/28/2023 Time: 4098-1191 SLP Time Calculation (min) (ACUTE ONLY): 16 min  Assessment / Plan / Recommendation Clinical Impression  Reviewed results MBS and pt able to independently recall chin tuck strategy with liquids. She implemented 90% of the time but did forget once at the end of session. While performing chin tuck she did have one immediate cough out of multiple trials throughout session with straw however majority of sips she took with straw and did fine. If takes small sips can continue to use straws which she was able to demonstrate. Functional oral mastication, transit with solid texture without residue. Observed taking pills whole in puree without difficulty. RN stated she ate meal last night without s/s aspiration. Recommend continue regular texture, thin liquids with chin tuck. Plan is for possible rehab admit. Feel she could benefit from minimum of one more session on acute.    HPI HPI: Natasha Vasquez Natasha Vasquez is a 83 y.o. female presented to the [facility] ED 2/10 for evaluation of right-sided weakness and balance problems. 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 Patient transferred to [facility] same day for potential arteriogram for 02/26/23. PHMx: HTN, T2DM, HLD, depression, OSA with CPAP, osteoarthritis, ascending thoracic aortic aneurysm, asthma and GERD, Barretts esophagus. BSE 2/10 at [facility] revealed s/s pharyngeal dysphagia and recommended Dys 3, nectar thick liquids with right head turn until MBS can be performed.      SLP Plan  Continue with current plan of care      Recommendations for follow up therapy are one component of a multi-disciplinary discharge planning process, led by the attending physician.  Recommendations may be updated based on patient status, additional functional criteria and insurance  authorization.    Recommendations  Diet recommendations: Regular;Thin liquid Liquids provided via: Cup;Straw Medication Administration: Whole meds with puree Supervision: Patient able to self feed Compensations: Slow rate;Small sips/bites;Chin tuck (chin tuck with liquids) Postural Changes and/or Swallow Maneuvers: Seated upright 90 degrees                Rehab consult Oral care BID;Oral care before and after PO;Patient independent with oral care   Intermittent Supervision/Assistance Dysphagia, pharyngeal phase (R13.13)     Continue with current plan of care     Royce Macadamia  02/28/2023, 10:03 AM

## 2023-02-28 NOTE — Progress Notes (Signed)
Physical Therapy Treatment Patient Details Name: Natasha Vasquez MRN: [id number] DOB: 11-29-1940 Today's Date: 02/28/2023   History of Present Illness 83 y.o. female admitted to [facility] ED 2/10 with Rt sided weakness and impaired balance. 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 Transferred to [facility] same date. 2/12 IR Rt vertebral angiogram and bilateral common carotid arteriograms via Rt radial and left fem access. PMHx: HTN, T2DM, HLD, depression, OSA with CPAP, OA, ascending thoracic aortic aneurysm, asthma and GERD.    PT Comments  Pt pleasant and frustrated with lack of coordination of RUE. Pt with improved gait and activity tolerance and able to work on unsupported standing and hall ambulation with RW. Pt with improved stride and BOS with RW and encouraged continued use. Pt also utilizing RUE to brush teeth in standing. Pt functioning well and would benefit from assist at home as well as OPPT.     If plan is discharge home, recommend the following: A little help with walking and/or transfers;A little help with bathing/dressing/bathroom;Assist for transportation;Help with stairs or ramp for entrance;Assistance with cooking/housework   Can travel by private Scientist, research (medical) walker (2 wheels)    Recommendations for Other Services       Precautions / Restrictions Precautions Precautions: Fall;Other (comment) Recall of Precautions/Restrictions: Intact Precaution/Restrictions Comments: 2/12 Rt radial IR access     Mobility  Bed Mobility Overal bed mobility: Modified Independent                  Transfers Overall transfer level: Modified independent                 General transfer comment: cues to limit pushing with RUE. pt able to stand from bed and to/from toilet with left rail    Ambulation/Gait Ambulation/Gait assistance: Contact guard assist Gait Distance (Feet): 200 Feet Assistive device: Rolling  walker (2 wheels) Gait Pattern/deviations: Step-through pattern, Decreased stride length   Gait velocity interpretation: 1.31 - 2.62 ft/sec, indicative of limited community ambulator   General Gait Details: pt with varied gait with narrow BOS at times and with cues can increase BOS. PT drags Rt foot at times and others exaggerates stepping. With use of RW pt able to maintain balance and direction. Walked in room without AD with increased RLE deficits and impaired balance with continued education for RW when not with therapy   Stairs             Wheelchair Mobility     Tilt Bed    Modified Rankin (Stroke Patients Only) Modified Rankin (Stroke Patients Only) Pre-Morbid Rankin Score: No symptoms Modified Rankin: Moderately severe disability     Balance Overall balance assessment: Needs assistance Sitting-balance support: No upper extremity supported, Feet supported Sitting balance-Leahy Scale: Good Sitting balance - Comments: toilet and EOB without support   Standing balance support: Single extremity supported, Bilateral upper extremity supported, No upper extremity supported Standing balance-Leahy Scale: Fair Standing balance comment: pt able to static stand at sink to wash hands, brusth teeth. with mobility needs single UE or UB support     Tandem Stance - Right Leg: 6 Tandem Stance - Left Leg: 7   Rhomberg - Eyes Closed: 20   High Level Balance Comments: pt able to achieve rhomberg stance with CGA, min assist to acheive tandem stance but can hold for 6-8 sec over repeated trials  Communication Communication Communication: No apparent difficulties  Cognition Arousal: Alert Behavior During Therapy: WFL for tasks assessed/performed   PT - Cognitive impairments: No apparent impairments                         Following commands: Intact      Cueing Cueing Techniques: Verbal cues  Exercises      General Comments        Pertinent  Vitals/Pain Pain Assessment Pain Assessment: No/denies pain    Home Living                          Prior Function            PT Goals (current goals can now be found in the care plan section) Progress towards PT goals: Progressing toward goals    Frequency    Min 1X/week      PT Plan      Co-evaluation              AM-PAC PT "6 Clicks" Mobility   Outcome Measure  Help needed turning from your back to your side while in a flat bed without using bedrails?: None Help needed moving from lying on your back to sitting on the side of a flat bed without using bedrails?: None Help needed moving to and from a bed to a chair (including a wheelchair)?: A Little Help needed standing up from a chair using your arms (e.g., wheelchair or bedside chair)?: A Little Help needed to walk in hospital room?: A Little Help needed climbing 3-5 steps with a railing? : A Little 6 Click Score: 20    End of Session Equipment Utilized During Treatment: Gait belt Activity Tolerance: Patient tolerated treatment well Patient left: with call bell/phone within reach;in chair Nurse Communication: Mobility status;Other (comment) (need for chair alarm not present in room) PT Visit Diagnosis: Other abnormalities of gait and mobility (R26.89);Muscle weakness (generalized) (M62.81);Difficulty in walking, not elsewhere classified (R26.2);Hemiplegia and hemiparesis Hemiplegia - Right/Left: Right Hemiplegia - dominant/non-dominant: Dominant Hemiplegia - caused by: Cerebral infarction     Time: 8295-6213 PT Time Calculation (min) (ACUTE ONLY): 27 min  Charges:    $Gait Training: 8-22 mins $Therapeutic Activity: 8-22 mins PT General Charges $$ ACUTE PT VISIT: 1 Visit                     Merryl Hacker, PT Acute Rehabilitation Services Office: [phone]    Natasha Vasquez 02/28/2023, 12:12 PM

## 2023-02-28 NOTE — TOC Initial Note (Signed)
Transition of Care (TOC) - Initial/Assessment Note  Donn Pierini RN, BSN Transitions of Care Unit 4E- RN Case Manager See Treatment Team for direct phone #   Patient Details  Name: Natasha Vasquez MRN: [id number] Date of Birth: [birth year]  Transition of Care [facility]) CM/SW Contact:    Darrold Span, RN Phone Number: 02/28/2023, 4:29 PM  Clinical Narrative:                 CM received msg from CIR liaison that insurance has denied INPT rehab stay and pt wants to return home w/ HH.   CM in to speak with pt at bedside- discussed option for [facility] vs outpt- pt confirmed she prefers HH at this time with plan to return to home at [facility].   Address, phone # and PCP all confirmed, spouse to transport home.   List provided for [facility] choice Per CMS guidelines from PhoneFinancing.pl website with star ratings (copy placed in shadow chart)- pt has selected [facility] and [facility] as backup for [facility] needs.   Per pt she has walkers at home and declines and further DME needs at this time.   Call made to [facility] liaison- after checking staffing for SLP- they are unable to accept referral due to no SLP available for [facility] area.  Call made to [facility]- referral has been accepted for needed services- Rn/PT/OT/SLP/aide/sw. - they will contact pt to schedule post discharge.   No further TOC needs noted at this time.   Expected Discharge Plan: IP Rehab Facility Barriers to Discharge: Continued Medical Work up   Patient Goals and CMS Choice Patient states their goals for this hospitalization and ongoing recovery are:: return home w/ [facility] CMS Medicare.gov Compare Post Acute Care list provided to:: Patient Choice offered to / list presented to : Patient      Expected Discharge Plan and Services In-house Referral: Clinical Social Work Discharge Planning Services: CM Consult Post Acute Care Choice: Home Health Living arrangements for the past 2 months: Independent Living Facility                  DME Arranged: N/A DME Agency: NA       HH Arranged: RN, PT, OT, Nurse's Aide, Speech Therapy, Social Work HH Agency: [facility] Date [facility] Agency Contacted: 02/28/23 Time HH Agency Contacted: 1629 Representative spoke with at [facility] Agency: Kandee Keen  Prior Living Arrangements/Services Living arrangements for the past 2 months: Independent Living Facility Lives with:: Self Patient language and need for interpreter reviewed:: Yes Do you feel safe going back to the place where you live?: Yes      Need for Family Participation in Patient Care: Yes (Comment) Care giver support system in place?: Yes (comment) Current home services: DME Criminal Activity/Legal Involvement Pertinent to Current Situation/Hospitalization: No - Comment as needed  Activities of Daily Living      Permission Sought/Granted Permission sought to share information with : Facility Industrial/product designer granted to share information with : Yes, Verbal Permission Granted     Permission granted to share info w AGENCY: HH        Emotional Assessment Appearance:: Appears stated age Attitude/Demeanor/Rapport: Engaged Affect (typically observed): Accepting Orientation: : Oriented to Self, Oriented to Place, Oriented to  Time, Oriented to Situation Alcohol / Substance Use: Not Applicable Psych Involvement: No (comment)  Admission diagnosis:  CVA (cerebrovascular accident) [facility]) [I63.9] Patient Active Problem List   Diagnosis Date Noted   ICAO (internal carotid artery  occlusion), left 02/26/2023   Brainstem infarct, acute (HCC) 02/26/2023   Right sided weakness 02/25/2023   CVA (cerebrovascular accident) (HCC) 02/25/2023   Moderate episode of recurrent major depressive disorder (HCC) 02/11/2023   Sciatic pain, left 09/27/2021   SOB (shortness of breath) 05/16/2021   Aneurysm of ascending aorta without rupture (HCC) 05/10/2021   HLD (hyperlipidemia) 05/04/2021   Elevated troponin 05/04/2021    Chronic diastolic CHF (congestive heart failure) (HCC) 05/04/2021   Recurrent major depressive disorder, in partial remission (HCC) 03/24/2021   Gout 03/24/2021   PAD (peripheral artery disease) (HCC) 06/24/2020   Iron deficiency 10/07/2018   Primary osteoarthritis of left knee 09/17/2018   Emphysema lung (HCC) 03/14/2018   Diabetic peripheral neuropathy associated with type 2 diabetes mellitus (HCC) 03/14/2018   Metatarsalgia of both feet 03/14/2018   Abnormality of lung 03/14/2018   Acquired absence of right breast 06/11/2017   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   Dysphagia 03/11/2017   Obesity 03/11/2017   Subclinical hypothyroidism 06/08/2015   Osteopenia 06/08/2015   Billowing mitral valve 06/08/2015   Melanoma in situ (HCC) 06/08/2015   Cramps of lower extremity 06/08/2015   Type 2 diabetes mellitus (HCC) 05/18/2015   Chronic diarrhea 05/18/2015   Anemia 05/18/2015   Clinical depression 01/26/2015   Hyperlipidemia associated with type 2 diabetes mellitus (HCC) 10/28/2014   Hypertension associated with diabetes (HCC) 09/27/2014   GERD (gastroesophageal reflux disease) 08/06/2014   Overactive bladder 07/16/2014   PCP:  Erasmo Downer, MD Pharmacy:   [facility] [phone] [facility] - [address] [place] [address] Phone: [phone] Fax: [phone]     Social Drivers of Health (SDOH) Social History: SDOH Screenings   Food Insecurity: No Food Insecurity (02/26/2023)  Housing: Low Risk  (02/26/2023)  Transportation Needs: No Transportation Needs (02/26/2023)  Utilities: At Risk (02/26/2023)  Alcohol Screen: Low Risk  (05/01/2022)  Depression (PHQ2-9): Medium Risk (05/01/2022)  Financial Resource Strain: Low Risk  (04/23/2022)  Physical Activity: Sufficiently Active (04/23/2022)  Social Connections: Socially Integrated (02/26/2023)   Stress: No Stress Concern Present (04/23/2022)  Tobacco Use: Medium Risk (02/26/2023)   SDOH Interventions:     Readmission Risk Interventions     No data to display

## 2023-02-28 NOTE — Progress Notes (Signed)
Occupational Therapy Treatment Patient Details Name: Natasha Vasquez MRN: [id number] DOB: Apr 25, 1940 Today's Date: 02/28/2023   History of present illness 83 y.o. female admitted to [facility] ED 2/10 with Rt sided weakness and impaired balance. 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 Transferred to [facility] same date. 2/12 IR Rt vertebral angiogram and bilateral common carotid arteriograms via Rt radial and left fem access. PMHx: HTN, T2DM, HLD, depression, OSA with CPAP, OA, ascending thoracic aortic aneurysm, asthma and GERD.   OT comments  This 82 yo female seen today to focus on RUE FM coordination. Pt had difficulty with all 12 activities I had her do as well as wanting to use compensatory movements of arm to get tasks completed. We talked about this and with cues she was able to do more isolated movements. She was given these 12 activities to work on as her homework. We will continue to follow with intensive inpatient follow-up therapy, >3 hours/day still being recommended due to she needs to me Mod I/independent to go home and supervise her husband who has mild dementia.        If plan is discharge home, recommend the following:  A little help with walking and/or transfers;A little help with bathing/dressing/bathroom;Assistance with cooking/housework;Assist for transportation;Help with stairs or ramp for entrance   Equipment Recommendations  None recommended by OT    Recommendations for Other Services Rehab consult    Precautions / Restrictions Precautions Precautions: Fall Recall of Precautions/Restrictions: Intact Precaution/Restrictions Comments: 2/12 Rt radial IR access Restrictions Weight Bearing Restrictions Per Provider Order: No              ADL either performed or assessed with clinical judgement   ADL Overall ADL's : Needs assistance/impaired Eating/Feeding: Modified independent Eating/Feeding Details (indicate cue type and reason):  increased time and effort due to decreaesd coordination, has to use 2 hands to pick up and maintain hold on cup to bring to mouth and put back down, tends to use compensatory movements for hand to mouth (shoulder adduction with elbow flexion v. elbow flexion only)                   Lower Body Dressing Details (indicate cue type and reason): increased time and effor to lace and tie shoe                    Extremity/Trunk Assessment Upper Extremity Assessment Upper Extremity Assessment: Right hand dominant;RUE deficits/detail RUE Deficits / Details: Pt reports she feels like her RUE is moving/functioning better for her since I last saw her 2 days ago. Instructed pt in RUE FM activities in addition to her continuing fo functionally using it. RUE Coordination: decreased fine motor;decreased gross motor            Vision Baseline Vision/History: 1 Wears glasses (reading)           Communication Communication Communication: No apparent difficulties   Cognition Arousal: Alert Behavior During Therapy: WFL for tasks assessed/performed Cognition: No apparent impairments                               Following commands: Intact           Exercises Other Exercises Other Exercises: Provided pt with FM coordination handout and theraputty handout as well as red theraputty, 4 coins, 4 clothespins, and a deck of cards for her to have to do some of  the activties. Had her perform each of the activities so she could show me that she knew how to do them. She is to do 3-4 of the FM activties 3 times a day (but not the same ones each time) as well as the theraputty sequencing sheet 3/day.            Pertinent Vitals/ Pain       Pain Assessment Pain Assessment: No/denies pain Faces Pain Scale: No hurt         Frequency    Min 1x/week       Progress Toward Goals  OT Goals(current goals can now be found in the care plan section)  Progress towards OT goals:  Progressing toward goals  Acute Rehab OT Goals Patient Stated Goal: to go to rehab before home OT Goal Formulation: With patient Time For Goal Achievement: 03/12/23 Potential to Achieve Goals: Good         AM-PAC OT "6 Clicks" Daily Activity     Outcome Measure   Help from another person eating meals?: None Help from another person taking care of personal grooming?: A Little Help from another person toileting, which includes using toliet, bedpan, or urinal?: A Little Help from another person bathing (including washing, rinsing, drying)?: A Little Help from another person to put on and taking off regular upper body clothing?: A Little Help from another person to put on and taking off regular lower body clothing?: A Little 6 Click Score: 19    End of Session    OT Visit Diagnosis: Unsteadiness on feet (R26.81);Other abnormalities of gait and mobility (R26.89);Muscle weakness (generalized) (M62.81);Hemiplegia and hemiparesis Hemiplegia - Right/Left: Right Hemiplegia - dominant/non-dominant: Dominant Hemiplegia - caused by: Cerebral infarction   Activity Tolerance Patient tolerated treatment well   Patient Left in chair;with call bell/phone within reach           Time: 1206-1236 OT Time Calculation (min): 30 min  Charges: OT General Charges $OT Visit: 1 Visit OT Treatments $Therapeutic Activity: 23-37 mins  Lindon Romp OT Acute Rehabilitation Services Office [phone]    Evette Georges 02/28/2023, 1:42 PM

## 2023-02-28 NOTE — Progress Notes (Signed)
  Inpatient Rehabilitation Admissions Coordinator   Dr Shearon Stalls to do peer to peer with [facility] if needed today. I await insurance approval to admit to CIR.  Ottie Glazier, RN, MSN Rehab Admissions Coordinator [phone] 02/28/2023 10:16 AM

## 2023-02-28 NOTE — Progress Notes (Signed)
PROGRESS NOTE    Natasha Vasquez  ZOX:[id number] DOB: [birth year] DOA: 02/25/2023 PCP: Erasmo Downer, MD    No chief complaint on file.   Brief Narrative:  Natasha Vasquez is a 83 y.o. 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      Assessment & Plan:   Principal Problem:   CVA (cerebrovascular accident) (HCC) Active Problems:   GERD (gastroesophageal reflux disease)   Hypertension associated with diabetes (HCC)   Type 2 diabetes mellitus (HCC)   Dysphagia   Diabetic peripheral neuropathy associated with type 2 diabetes mellitus (HCC)   HLD (hyperlipidemia)   ICAO (internal carotid artery occlusion), left   Brainstem infarct, acute (HCC)  #1 acute ischemic stroke/left midbrain infarct/acute left cerebral peduncle infarct/asymptomatic high-grade proximal left ICA stenosis -Patient noted to have presented with acute onset of right-sided weakness and balance problems and noted to have evidence of acute infarct in the left midbrain and cerebral peduncle. -CT head done on admission with age-indeterminate superior cerebellar infarct. -CT angiogram head and neck done with calcified plaque at the left carotid bifurcation and left ICA bulb with new occlusion of the left ICA bulb. -MRI brain done with acute infarct in the left midbrain and left cerebral peduncle, old small vessel infarction and cerebral pons, mild small vessel ischemic changes. -2D echo done with a EF of 55 to 60%, mild LVH, grade 1 DD, normal left atrial size, normal atrial septum with no evidence of interarterial shunt noted. -Fasting lipid panel with LDL of 81. -Hemoglobin A1c 6.6. -Patient being followed by neurology who are recommending will need repeat MRI in 3 months  follow-up. -Neurology recommending DAPT with aspirin 81 mg daily and clopidogrel 75 mg daily x 3 weeks then clopidogrel alone. -Patient being followed by PT/OT who are recommending CIR placement. -Patient noted to have been denied CIR and as such we will pursue home health. -Will also need outpatient follow-up with neurology.  2.  Severe left ICA stenosis -CT angiogram head and neck with new occlusion of left ICA. -Patient seen in consultation by neurology as well as interventional neurology and patient subsequently underwent right vertebral angiogram and bilateral common carotid arteriograms with severe preocclusive stenosis of the proximal left ICA noted. -Neurology discussed findings with patient and family and per neurology prefer to have revascularization done and before angioplasty stenting or carotid surgery. -Neurology recommending continued ongoing therapy with rehab with elective left carotid revascularization in 2 to 4 weeks with Dr. Corliss Skains.  3.  Hyperlipidemia -Fasting lipid panel with LDL of 81 with goal LDL < 70. -Crestor dose increased to 20 mg daily. -Patient follow-up.  4.  Hypertension -BP trending up.   -Continue Coreg 6.25 mg twice daily.   -Resume home regimen lisinopril.    5.  Well-controlled type 2 diabetes mellitus -Hemoglobin A1c 6.6 (02/11/2023) -CBG noted at 104 this morning. -Noted to be on Mounjaro prior to admission which is currently on hold. -Continue SSI.  6.  Post stroke dysphagia -Being followed by SLP. -Continue current diet. -Will need outpatient follow-up with SLP.  7.  Right hip pain -Patient with complaints of right hip pain/discomfort intermittent in nature and describes as a spasm/aching. -Patient with history of osteoarthritis. -Patient denies any recent falls. -Hip nontender to palpation. -Plain films of the  hip done with no acute abnormalities. -Continue Robaxin as needed.  8.  Asthma -Stable. -Nebs as needed.  9.   OSA -CPAP nightly.    DVT prophylaxis: Lovenox Code Status: Full Family Communication: Updated patient.  No family at bedside.  Disposition: Home health.  Status is: Inpatient Remains inpatient appropriate because: Severity of illness   Consultants:  Neurology: Dr. Selina Cooley 02/25/2023 Neurointerventional radiology: Dr. Corliss Skains 02/26/2023 Inpatient rehab  Procedures:  CT angiogram head and neck 02/25/2023 CT angiogram head and neck 02/25/2023 MRI brain 02/25/2023 2D echo 02/26/2023 Right vertebral angiogram and bilateral common carotid arteriograms.  Neurovascular intervention: Dr.Deveshwar 02/27/2023  Antimicrobials:  Anti-infectives (From admission, onward)    None         Subjective: Patient sitting up in chair.  Denies any chest pain or shortness of breath.  No abdominal pain.  Stated ambulated with therapy today.  Feels balance is slowly improving and strength on the right side is slowly improving.  Denies any bleeding.  Tolerating current diet.    Objective: Vitals:   02/28/23 0411 02/28/23 0839 02/28/23 0940 02/28/23 1206  BP: (!) 182/84  (!) 153/101   Pulse: 70  70 80  Resp: 16  19   Temp: 98.3 F (36.8 C)  98.3 F (36.8 C)   TempSrc: Oral  Oral   SpO2: 98% 91% 95%     Intake/Output Summary (Last 24 hours) at 02/28/2023 1215 Last data filed at 02/28/2023 1100 Gross per 24 hour  Intake 1908.75 ml  Output --  Net 1908.75 ml   There were no vitals filed for this visit.  Examination:  General exam: NAD. Respiratory system: Lungs clear to auscultation bilaterally.  No wheezes, no crackles, no rhonchi.  Fair air movement.  Speaking in full sentences.   Cardiovascular system: RRR no murmurs rubs or gallops.  No JVD.  No lower extremity edema.   Gastrointestinal system: Abdomen is nondistended, soft, nontender, positive bowel sounds.  No rebound.  No guarding.  Central nervous system: Alert and oriented.  Some dysarthric speech.  Right-sided weakness.   Extremities: 3-4/5 RUE strength.  5/5 left upper extremity strength. Skin: No rashes, lesions or ulcers Psychiatry: Judgement and insight appear normal. Mood & affect appropriate.     Data Reviewed: I have personally reviewed following labs and imaging studies  CBC: Recent Labs  Lab 02/25/23 1113 02/26/23 0317 02/27/23 0334  WBC 7.1 8.9 8.4  NEUTROABS 5.1  --   --   HGB 12.4 12.5 12.5  HCT 38.4 37.8 37.1  MCV 90.8 88.5 87.7  PLT 301 288 269    Basic Metabolic Panel: Recent Labs  Lab 02/25/23 1113 02/26/23 0317 02/27/23 0334 02/28/23 0349  NA 140 141 140 141  K 3.8 3.6 3.5 3.7  CL 105 104 101 106  CO2 25 24 24 24   GLUCOSE 135* 107* 131* 109*  BUN 14 10 17 20   CREATININE 0.86 0.91 0.85 0.95  CALCIUM 9.3 9.6 9.4 9.1  MG 2.0  --   --   --     GFR: CrCl cannot be calculated (Unknown ideal weight.).  Liver Function Tests: Recent Labs  Lab 02/25/23 1113  AST 34  ALT 40  ALKPHOS 71  BILITOT 0.6  PROT 7.3  ALBUMIN 4.5    CBG: Recent Labs  Lab 02/27/23 1111 02/27/23 1608 02/27/23 2111 02/28/23 0617 02/28/23 1130  GLUCAP 109* 173* 92 104* 175*     No results found for this or any previous visit (from the  past 240 hours).       Radiology Studies: DG Swallowing Func-Speech Pathology Result Date: 02/27/2023 Table formatting from the original result was not included. Modified Barium Swallow Study Patient Details Name: Natasha Vasquez MRN: [id number] Date of Birth: [birth year] Today's Date: 02/27/2023 HPI/PMH: HPI: Natasha Vasquez is a 83 y.o. female presented to the [facility] ED 2/10 for evaluation of right-sided weakness and balance problems. 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 Patient transferred to [facility] same day for potential arteriogram for 02/26/23. PHMx: HTN, T2DM, HLD, depression, OSA with CPAP, osteoarthritis, ascending thoracic aortic aneurysm, asthma and GERD, Barretts esophagus. BSE 2/10 at [facility] revealed s/s pharyngeal dysphagia and recommended Dys 3, nectar thick liquids with right head turn until MBS can be performed. Clinical Impression: Clinical Impression: Patient presents with a pharyngeal phase dysphagia as per this MBS. Swallow was initated at level of pyriform sinus with liquids and vallecular sinus with puree solids. Nectar thick liquids via cup sips with head in neutral position resulted in shallow penetration (PAS) but no aspiration. With thin liquids, she had consistent, deep penetration to vocal cords (PAS 4) with cup sips. This led to one incident of silent aspiration (PAS 8) of cup sip of thin liquids. Chin tuck posture with cup and straw sips of thin liquids resulted in reduction of depth of penetration and prevented aspiration. 13mm barium tablet taken with thin liquids with chin tuck posture, resulted in tablet becoming lodged in vallecular sinus. Puree solids helped to transit tablet fully. No significant amount of residuals observed s/p initial swallows with any of the tested consistencies. 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 SLP recommending regular solids, thin liquids with chin tuck. Recommend taking medications whole with puree/pudding texture. Recommendations/Plan: Swallowing Evaluation Recommendations Swallowing Evaluation Recommendations Recommendations: PO diet PO Diet Recommendation: Regular; Thin liquids (Level 0) Liquid Administration via: Cup; Straw Medication Administration: Whole meds with puree Supervision: Patient able to self-feed Swallowing strategies  : Slow rate; Small bites/sips; Chin tuck Postural changes: Position pt fully upright for meals Oral care recommendations: Oral care BID (2x/day) Treatment Plan Treatment Plan Treatment recommendations: Therapy as outlined in treatment plan below Follow-up recommendations: No SLP follow up Functional status  assessment: Patient has had a recent decline in their functional status and demonstrates the ability to make significant improvements in function in a reasonable and predictable amount of time. Treatment frequency: Min 1x/week Treatment duration: 1 week Interventions: Aspiration precaution training; Diet toleration management by SLP; Patient/family education Recommendations Recommendations for follow up therapy are one component of a multi-disciplinary discharge planning process, led by the attending physician.  Recommendations may be updated based on patient status, additional functional criteria and insurance authorization. Assessment: Orofacial Exam: Orofacial Exam Oral Cavity: Oral Hygiene: WFL Oral Cavity - Dentition: Adequate natural dentition Orofacial Anatomy: WFL Anatomy: Anatomy: Prominent cricopharyngeus Boluses Administered: Boluses Administered Boluses Administered: Thin liquids (Level 0); Mildly thick liquids (Level 2, nectar thick); Puree  Oral Impairment Domain: Oral Impairment Domain Lip Closure: No labial escape Tongue control during bolus hold: Cohesive bolus between tongue to palatal seal Bolus preparation/mastication: Timely and efficient chewing and mashing Bolus transport/lingual motion: Brisk tongue motion Oral residue: Complete oral clearance Location of oral residue : N/A Initiation of pharyngeal swallow : Pyriform sinuses; Valleculae  Pharyngeal Impairment Domain: Pharyngeal Impairment Domain Soft palate elevation: No bolus between soft palate (SP)/pharyngeal wall (PW) Laryngeal elevation: Partial  superior movement of thyroid cartilage/partial approximation of arytenoids to epiglottic petiole Anterior hyoid excursion: Partial anterior movement Epiglottic movement: Complete inversion Laryngeal vestibule closure: Complete, no air/contrast in laryngeal vestibule Pharyngeal stripping wave : Present - complete Pharyngeal contraction (A/P view only): N/A Pharyngoesophageal segment opening:  Complete distension and complete duration, no obstruction of flow Tongue base retraction: No contrast between tongue base and posterior pharyngeal wall (PPW) Pharyngeal residue: Complete pharyngeal clearance Location of pharyngeal residue: N/A  Esophageal Impairment Domain: Esophageal Impairment Domain Esophageal clearance upright position: Complete clearance, esophageal coating Pill: Pill Consistency administered: Thin liquids (Level 0); Puree Thin liquids (Level 0): Impaired (see clinical impressions) Puree: WFL Penetration/Aspiration Scale Score: Penetration/Aspiration Scale Score 1.  Material does not enter airway: Puree 2.  Material enters airway, remains ABOVE vocal cords then ejected out: Mildly thick liquids (Level 2, nectar thick); Thin liquids (Level 0) 4.  Material enters airway, CONTACTS cords then ejected out: Thin liquids (Level 0) 8.  Material enters airway, passes BELOW cords without attempt by patient to eject out (silent aspiration) : Thin liquids (Level 0) Compensatory Strategies: Compensatory Strategies Compensatory strategies: Yes Chin tuck: Effective Effective Chin Tuck: Thin liquid (Level 0)   General Information: No data recorded Diet Prior to this Study: Dysphagia 3 (mechanical soft); Mildly thick liquids (Level 2, nectar thick)   Temperature : Normal   Respiratory Status: WFL   Supplemental O2: None (Room air)   History of Recent Intubation: No  Behavior/Cognition: Alert; Cooperative; Pleasant mood Self-Feeding Abilities: Able to self-feed Baseline vocal quality/speech: Normal Volitional Cough: Able to elicit Volitional Swallow: Able to elicit Exam Limitations: No limitations Goal Planning: Prognosis for improved oropharyngeal function: Good No data recorded Barriers/Prognosis Comment: GERD/Barrett's esophagus; bell's palsy baseline No data recorded Consulted and agree with results and recommendations: Patient Pain: Pain Assessment Pain Assessment: No/denies pain Pain Score: 0 Faces Pain  Scale: 2 Pain Location: R hip Pain Descriptors / Indicators: Spasm; Discomfort Pain Intervention(s): Monitored during session; Repositioned End of Session: Start Time:SLP Start Time (ACUTE ONLY): 1430 Stop Time: SLP Stop Time (ACUTE ONLY): 1450 Time Calculation:SLP Time Calculation (min) (ACUTE ONLY): 20 min Charges: SLP Evaluations $ SLP Speech Visit: 1 Visit SLP Evaluations $BSS Swallow: 1 Procedure $MBS Swallow: 1 Procedure $ SLP EVAL LANGUAGE/SOUND PRODUCTION: 1 Procedure SLP visit diagnosis: SLP Visit Diagnosis: Dysphagia, pharyngeal phase (R13.13) Past Medical History: Past Medical History: Diagnosis Date  Anemia in pregnancy   Arthritis   hands  Barrett esophagus   Bell palsy 04/24/2004  Breast neoplasm, Tis (DCIS), 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  ER/PR NEGATIVE  Cataract   Chronic cough   Complication of anesthesia   20 years ago pts b/p dropped really low, "coded"  Depression   Depression   Diabetes mellitus without complication (HCC)   diet controlled  Family history of adverse reaction to anesthesia   son PONV  GERD (gastroesophageal reflux disease)   Gout   Hypercholesteremia   Hyperlipidemia   Hypertension   Melanoma (HCC)   MVP (mitral valve prolapse)   followed by PCP  Nephritis   Nephritis   S/P appendectomy   Thoracic aortic aneurysm (HCC)   Torn meniscus   left Past Surgical History: Past Surgical History: Procedure Laterality Date  ABDOMINAL HYSTERECTOMY    APPENDECTOMY    BREAST BIOPSY Right 04/25/2017  retroareolar 10:00   wing clip   path pending  BREAST BIOPSY Right 04/25/2017  10:00  5CMFN  venus clip    path pending  BREAST  BIOPSY Right 05/03/2017  Affirm Bx- path pending  BREAST CYST ASPIRATION Bilateral   NEG  BREAST EXCISIONAL BIOPSY Left 20+ yrs ago  NEG  BREAST RECONSTRUCTION WITH PLACEMENT OF TISSUE EXPANDER AND FLEX HD (ACELLULAR HYDRATED DERMIS) Right 06/03/2017  Procedure: BREAST RECONSTRUCTION WITH PLACEMENT OF TISSUE EXPANDER AND FLEX HD (ACELLULAR HYDRATED DERMIS);  Surgeon:  Peggye Form, DO;  Location: [facility];  Service: Plastics;  Laterality: Right;  BREAST REDUCTION SURGERY Left 11/06/2017  Procedure: BREAST REDUCTION;  Surgeon: Peggye Form, DO;  Location: [facility];  Service: Plastics;  Laterality: Left;  BROW LIFT Bilateral 02/01/2015  Procedure: BLEPHAROPLASTY bilateral upper eyelids.;  Surgeon: Imagene Riches, MD;  Location: [facility];  Service: Ophthalmology;  Laterality: Bilateral;  DIABETIC - diet controlled  CATARACT EXTRACTION W/PHACO Left 01/14/2019  Procedure: CATARACT EXTRACTION PHACO AND INTRAOCULAR LENS PLACEMENT (IOC) LEFT TECNIS TORIC ADD 5.62 00:46.5 30.0%;  Surgeon: Lockie Mola, MD;  Location: [facility];  Service: Ophthalmology;  Laterality: Left;  diabetic - diet controlled  CATARACT EXTRACTION W/PHACO Right 02/11/2019  Procedure: CATARACT EXTRACTION PHACO AND INTRAOCULAR LENS PLACEMENT (IOC) RIGHT DIABETIC 4.82 00:46.9 10.3%;  Surgeon: Lockie Mola, MD;  Location: [facility];  Service: Ophthalmology;  Laterality: Right;  Diabetic - diet controlled  CHOLECYSTECTOMY    COLONOSCOPY    COLONOSCOPY WITH PROPOFOL N/A 05/11/2015  Procedure: COLONOSCOPY WITH PROPOFOL;  Surgeon: Scot Jun, MD;  Location: [facility];  Service: Endoscopy;  Laterality: N/A;  COLONOSCOPY WITH PROPOFOL N/A 09/07/2020  Procedure: COLONOSCOPY WITH PROPOFOL;  Surgeon: Toledo, Boykin Nearing, MD;  Location: [facility];  Service: Gastroenterology;  Laterality: N/A;  ESOPHAGOGASTRODUODENOSCOPY    ESOPHAGOGASTRODUODENOSCOPY (EGD) WITH PROPOFOL  05/11/2015  Procedure: ESOPHAGOGASTRODUODENOSCOPY (EGD) WITH PROPOFOL;  Surgeon: Scot Jun, MD;  Location: [facility] ENDOSCOPY;  Service: Endoscopy;;  FRACTURE SURGERY Right   arm and shoulder  JOINT REPLACEMENT    KNEE ARTHROSCOPY Left 03/14/2016  Procedure: ARTHROSCOPY KNEE, PARTIAL MEDIAL MENISECTOMY, CHONDROPLASTY;  Surgeon: Donato Heinz, MD;  Location: [facility];  Service: Orthopedics;   Laterality: Left;  MASTECTOMY Right 2019  MASTECTOMY W/ SENTINEL NODE BIOPSY Right 06/03/2017  Procedure: MASTECTOMY WITH SENTINEL LYMPH NODE BIOPSY;  Surgeon: Earline Mayotte, MD;  Location: [facility];  Service: General;  Laterality: Right;  PARTIAL HIP ARTHROPLASTY Right   REDUCTION MAMMAPLASTY Left 2019  REMOVAL OF BILATERAL TISSUE EXPANDERS WITH PLACEMENT OF BILATERAL BREAST IMPLANTS Right 11/06/2017  Procedure: REMOVAL OF TISSUE EXPANDER WITH PLACEMENT OF BREAST IMPLANT;  Surgeon: Peggye Form, DO;  Location: [facility];  Service: Plastics;  Laterality: Right;  TONSILLECTOMY   Angela Nevin, MA, CCC-SLP Speech Therapy        Scheduled Meds:  allopurinol  200 mg Oral Daily   ARIPiprazole  2 mg Oral Daily   aspirin  81 mg Oral Daily   carvedilol  6.25 mg Oral BID   clopidogrel  75 mg Oral Daily   colestipol  1 g Oral BID   enoxaparin (LOVENOX) injection  40 mg Subcutaneous Q24H   famotidine  20 mg Oral BID   ferrous sulfate  325 mg Oral Q breakfast   fluticasone furoate-vilanterol  1 puff Inhalation Daily   And   umeclidinium bromide  1 puff Inhalation Daily   insulin aspart  0-15 Units Subcutaneous TID WC   lisinopril  10 mg Oral Daily   oxybutynin  10 mg Oral Daily   pantoprazole  40 mg Oral Daily   rosuvastatin  20 mg Oral  Daily   venlafaxine XR  225 mg Oral Daily   Continuous Infusions:     LOS: 3 days    Time spent: 40 minutes    Ramiro Harvest, MD [facility]   To contact the attending provider between 7A-7P or the covering provider during after hours 7P-7A, please log into the web site [email or web address] and access using universal [place] password for that web site. If you do not have the password, please call the hospital operator.  02/28/2023, 12:15 PM

## 2023-03-01 DIAGNOSIS — E782 Mixed hyperlipidemia: Secondary | ICD-10-CM | POA: Diagnosis not present

## 2023-03-01 DIAGNOSIS — E1159 Type 2 diabetes mellitus with other circulatory complications: Secondary | ICD-10-CM | POA: Diagnosis not present

## 2023-03-01 DIAGNOSIS — I639 Cerebral infarction, unspecified: Secondary | ICD-10-CM | POA: Diagnosis not present

## 2023-03-01 DIAGNOSIS — R531 Weakness: Secondary | ICD-10-CM | POA: Diagnosis not present

## 2023-03-01 LAB — BASIC METABOLIC PANEL
Anion gap: 10 (ref 5–15)
BUN: 16 mg/dL (ref 8–23)
CO2: 24 mmol/L (ref 22–32)
Calcium: 8.9 mg/dL (ref 8.9–10.3)
Chloride: 104 mmol/L (ref 98–111)
Creatinine, Ser: 0.88 mg/dL (ref 0.44–1.00)
GFR, Estimated: >60 mL/min (ref >60)
Glucose, Bld: 125 mg/dL — ABNORMAL HIGH (ref 70–99)
Potassium: 3.6 mmol/L (ref 3.5–5.1)
Sodium: 138 mmol/L (ref 135–145)

## 2023-03-01 LAB — GLUCOSE, CAPILLARY
Glucose-Capillary: 115 mg/dL — ABNORMAL HIGH (ref 70–99)
Glucose-Capillary: 112 mg/dL — ABNORMAL HIGH (ref 70–99)

## 2023-03-01 MED ORDER — CLOPIDOGREL BISULFATE 75 MG PO TABS: 75.0000 mg | ORAL_TABLET | Freq: Every day | ORAL | 1 refills | Status: DC

## 2023-03-01 MED ORDER — ROSUVASTATIN CALCIUM 20 MG PO TABS: 20.0000 mg | ORAL_TABLET | Freq: Every day | ORAL | 1 refills | Status: DC

## 2023-03-01 MED ORDER — ASPIRIN 81 MG PO TABS: 81.0000 mg | ORAL_TABLET | Freq: Every day | ORAL | Status: AC

## 2023-03-01 NOTE — Progress Notes (Signed)
   02/28/23 2000  Neurological  Neuro (WDL) X  Orientation Level Oriented X4  Cognition Appropriate attention/concentration;Appropriate judgement;Appropriate safety awareness;Appropriate for developmental age;Follows commands;No memory impairment  Speech Clear;Appropriate for developmental age  R Pupil Size (mm) 2  R Pupil Shape Round  R Pupil Reaction Brisk  L Pupil Size (mm) 3  L Pupil Shape Round  L Pupil Reaction Brisk  Motor Function/Sensation Assessment Grip;Head;Elbow extension;Elbow flexion;Pronator drift;Dorsiflexion;Plantar flexion;Arm ataxia;Leg ataxia;Motor response;Sensation;Motor strength  Facial Symmetry Asymmetrical right  Facial Droop Right  R Hand Grip Moderate  L Hand Grip Strong  R Elbow Extension (Push/Biceps) Moderate  L Elbow Extension (Push/Biceps) Strong  R Elbow Flexion (Pull/Triceps) Moderate  L Elbow Flexion (Pull/Triceps) Strong  Right Pronator Drift Present  Left Pronator Drift Absent  R Foot Dorsiflexion Strong  L Foot Dorsiflexion Strong  R Foot Plantar Flexion Strong  L Foot Plantar Flexion Strong  R Finger to Nose (Point to [employer]) Smooth  L Finger to Nose (Point to [employer]) Smooth  R Heel to [employer] (Point to [employer]) Smooth  L Heel to [employer] to [employer]) Smooth  RUE Motor Response Purposeful movement  RUE Sensation Full sensation  RUE Motor Strength 4  LUE Motor Response Purposeful movement  LUE Sensation Full sensation  LUE Motor Strength 5  RLE Motor Response Purposeful movement  RLE Sensation Decreased;Numbness (neuropathy)  RLE Motor Strength 4  LLE Motor Response Purposeful movement  LLE Sensation Decreased;Numbness (neuropathy)  LLE Motor Strength 5  Neuro Symptoms Fatigue  Neuro symptoms relieved by Relaxation techniques (Comment);Rest  Neuro Additional Assessments NIH stroke scale;Glasgow Coma Scale  Glasgow Coma Scale  Eye Opening 4  Best Verbal Response (NON-intubated) 5  Best Motor Response 6  Glasgow Coma Scale Score 15   NIH Stroke Scale   Dizziness Present No  Headache Present No  Interval Shift assessment  Level of Consciousness (1a.)    0  LOC Questions (1b. )    0  LOC Commands (1c. )    0  Best Gaze (2. )   0  Visual (3. )   0  Facial Palsy (4. )     1  Motor Arm, Left (5a. )    0  Motor Arm, Right (5b. )  1  Motor Leg, Left (6a. )   0  Motor Leg, Right (6b. )  1  Limb Ataxia (7. ) 0  Sensory (8. )   1  Best Language (9. )   0  Dysarthria (10. ) 0  Extinction/Inattention (11.)    0  Complete NIHSS TOTAL 4  Neurological  Level of Consciousness Alert   Stable hemodynamically, afebrile, normal respiratory effort, no distress. She is able to ambulate with walker to the bathroom with one standby assist. No complaints overnight. We will continue to monitor.   Filiberto Pinks, RN

## 2023-03-01 NOTE — Care Management Important Message (Signed)
Important Message  Patient Details  Name: Natasha Vasquez MRN: [id number] Date of Birth: 1940-10-09   Important Message Given:  Yes - Medicare IM     Renie Ora 03/01/2023, 10:58 AM

## 2023-03-01 NOTE — Progress Notes (Signed)
Mobility Specialist Progress Note:   03/01/23 0939  Mobility  Activity Ambulated with assistance to bathroom  Level of Assistance Contact guard assist, steadying assist  Assistive Device Front wheel walker  Distance Ambulated (ft) 12 ft  Activity Response Tolerated well  Mobility Referral Yes  Mobility visit 1 Mobility  Mobility Specialist Start Time (ACUTE ONLY) [id number]  Mobility Specialist Stop Time (ACUTE ONLY) [id number]  Mobility Specialist Time Calculation (min) (ACUTE ONLY) 3 min   Pt received in bed, requesting assistance to ambulate to bathroom. Required CGA and RW for safety. Tolerated well, audible wheezing, SpO2 86% on RA, recovered SpO2 97% on RA. Returned pt back to bed. Left with all needs met, call bell in reach.   Feliciana Rossetti Mobility Specialist Please contact via Special educational needs teacher or  Rehab office at [phone]

## 2023-03-01 NOTE — Progress Notes (Signed)
Physical Therapy Treatment Patient Details Name: Natasha Vasquez MRN: [id number] DOB: [birth year] Today's Date: 03/01/2023   History of Present Illness 84 y.o. female admitted to [facility] ED 2/10 with Rt sided weakness and impaired balance. 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 Transferred to [facility] same date. 2/12 IR Rt vertebral angiogram and bilateral common carotid arteriograms via Rt radial and left fem access. PMHx: HTN, T2DM, HLD, depression, OSA with CPAP, OA, ascending thoracic aortic aneurysm, asthma and GERD.    PT Comments  Pt is progressing well towards goals. Currently pt is mod I to supervision for all functional activities. This session focused on what pt needs for home; discussed how to set height for RW (Pt has RW at home that was not originally hers), discussed safety for getting in/out of walk in shower with use of grab bars and walker if needed. Pt educated on the importance of mobility at home including movement every hour while awake. Due to pt current functional status, home set up and available assistance at home recommending skilled physical therapy services 3x/week in order to address strength, balance and functional mobility to decrease risk for falls, injury and re-hospitalization.       If plan is discharge home, recommend the following: A little help with walking and/or transfers;Assist for transportation;Help with stairs or ramp for entrance;Assistance with cooking/housework     Equipment Recommendations  Rolling walker (2 wheels)    Recommendations for Other Services Rehab consult     Precautions / Restrictions Precautions Precautions: Fall Recall of Precautions/Restrictions: Intact Precaution/Restrictions Comments: 2/12 Rt radial IR access Restrictions Weight Bearing Restrictions Per Provider Order: No     Mobility  Bed Mobility Overal bed mobility: Modified Independent      Transfers Overall transfer level: Needs  assistance Equipment used: None Transfers: Sit to/from Stand Sit to Stand: Supervision           General transfer comment: Supervision for safety due to 1x LOB when standing due to turning with legs crossed when going to sit at bed to reach for blanket and losing balance.    Ambulation/Gait Ambulation/Gait assistance: Modified independent (Device/Increase time) Gait Distance (Feet): 10 Feet Assistive device: Rollator (4 wheels) Gait Pattern/deviations: Step-through pattern, Decreased stride length Gait velocity: decreased Gait velocity interpretation: 1.31 - 2.62 ft/sec, indicative of limited community ambulator   General Gait Details: short distance. Pt deferred today due to hoping to go home.    Modified Rankin (Stroke Patients Only) Modified Rankin (Stroke Patients Only) Pre-Morbid Rankin Score: No symptoms Modified Rankin: Slight disability     Balance Overall balance assessment: Mild deficits observed, not formally tested   Sitting balance-Leahy Scale: Good Sitting balance - Comments: EOB without support   Standing balance support: Bilateral upper extremity supported, No upper extremity supported, During functional activity Standing balance-Leahy Scale: Fair Standing balance comment: pt had 1x LOB when reaching for blanket prior to sitting due to crossing legs .        Communication Communication Communication: No apparent difficulties  Cognition Arousal: Alert Behavior During Therapy: WFL for tasks assessed/performed   PT - Cognitive impairments: No apparent impairments     Following commands: Intact      Cueing Cueing Techniques: Verbal cues     General Comments General comments (skin integrity, edema, etc.): no noted skin issues outside gown.      Pertinent Vitals/Pain Pain Assessment Pain Assessment: No/denies pain     PT Goals (current goals can now be found  in the care plan section) Acute Rehab PT Goals Patient Stated Goal: regain  independence PT Goal Formulation: With patient/family Time For Goal Achievement: 03/12/23 Potential to Achieve Goals: Good Progress towards PT goals: Progressing toward goals    Frequency    Min 1X/week      PT Plan  Continue with current POC        AM-PAC PT "6 Clicks" Mobility   Outcome Measure  Help needed turning from your back to your side while in a flat bed without using bedrails?: None Help needed moving from lying on your back to sitting on the side of a flat bed without using bedrails?: None Help needed moving to and from a bed to a chair (including a wheelchair)?: None Help needed standing up from a chair using your arms (e.g., wheelchair or bedside chair)?: None Help needed to walk in hospital room?: None Help needed climbing 3-5 steps with a railing? : A Little 6 Click Score: 23    End of Session Equipment Utilized During Treatment: Gait belt Activity Tolerance: Patient tolerated treatment well Patient left: in bed;with call bell/phone within reach Nurse Communication: Mobility status PT Visit Diagnosis: Other abnormalities of gait and mobility (R26.89);Muscle weakness (generalized) (M62.81);Difficulty in walking, not elsewhere classified (R26.2);Hemiplegia and hemiparesis Hemiplegia - Right/Left: Right Hemiplegia - dominant/non-dominant: Dominant Hemiplegia - caused by: Cerebral infarction     Time: 1914-7829 PT Time Calculation (min) (ACUTE ONLY): 9 min  Charges:    $Therapeutic Activity: 8-22 mins PT General Charges $$ ACUTE PT VISIT: 1 Visit                    Harrel Carina, DPT, CLT  Acute Rehabilitation Services Office: [phone] (Secure chat preferred)    Claudia Desanctis 03/01/2023, 4:11 PM

## 2023-03-01 NOTE — Progress Notes (Signed)
Discharge instructions reviewed with pt.  Copy of instructions given to pt. Pt informed her scripts were sent to her pharmacy for pick up. Reviewed signs and symptoms of stroke and when to call 911 and note time of symptoms and write it down. Pt's ride on their way, pt getting dressed at this time. Pt will go down to the discharge lounge while she waits for her family to arrive, pt's husband was made aware of this. Pt will be d/c'd via wheelchair with belongings and will be escorted by staff.   Broughton Eppinger,RN SWOT

## 2023-03-01 NOTE — TOC Transition Note (Signed)
Transition of Care (TOC) - Discharge Note Donn Pierini RN, BSN Transitions of Care Unit 4E- RN Case Manager See Treatment Team for direct phone #   Patient Details  Name: Natasha Vasquez MRN: [id number] Date of Birth: 1940/02/12  Transition of Care [facility]) CM/SW Contact:  Darrold Span, RN Phone Number: 03/01/2023, 3:45 PM   Clinical Narrative:    Pt stable for transition home today, HH set up with [facility]- liaison notified for start of care.   No DME needs, family to transport home.   TOC to sign off- no further needs noted.    Final next level of care: Home w Home Health Services Barriers to Discharge: Barriers Resolved   Patient Goals and CMS Choice Patient states their goals for this hospitalization and ongoing recovery are:: return home w/ [facility] CMS Medicare.gov Compare Post Acute Care list provided to:: Patient Choice offered to / list presented to : Patient      Discharge Placement               Home w/ [facility]        Discharge Plan and Services Additional resources added to the After Visit Summary for   In-house Referral: Clinical Social Work Discharge Planning Services: CM Consult Post Acute Care Choice: Home Health          DME Arranged: N/A DME Agency: NA       HH Arranged: RN, PT, OT, Nurse's Aide, Speech Therapy, Social Work [employer] Agency: [facility] Date [facility] Agency Contacted: 02/28/23 Time HH Agency Contacted: [phone] Representative spoke with at [facility] Agency: Kandee Keen  Social Drivers of Health (SDOH) Interventions SDOH Screenings   Food Insecurity: No Food Insecurity (02/26/2023)  Housing: Low Risk  (02/26/2023)  Transportation Needs: No Transportation Needs (02/26/2023)  Utilities: At Risk (02/26/2023)  Alcohol Screen: Low Risk  (05/01/2022)  Depression (PHQ2-9): Medium Risk (05/01/2022)  Financial Resource Strain: Low Risk  (04/23/2022)  Physical Activity: Sufficiently Active (04/23/2022)  Social Connections: Socially Integrated (02/26/2023)   Stress: No Stress Concern Present (04/23/2022)  Tobacco Use: Medium Risk (02/26/2023)     Readmission Risk Interventions    03/01/2023    3:45 PM  Readmission Risk Prevention Plan  Transportation Screening Complete  Home Care Screening Complete  Medication Review (RN CM) Complete

## 2023-03-01 NOTE — Progress Notes (Signed)
Referring Physician(s): Siadecki,Sebastian  Supervising Physician: Julieanne Cotton  Patient Status:  Natasha Vasquez - In-pt  Chief Complaint: Right sided weakness  Subjective: Patient resting comfortably. States she is improving and is working on her PT exercises regularly.   Allergies: Codeine, Quinolones, Aleve [naproxen sodium], Lorazepam, Oxycodone, Paregoric, and Penicillins  Medications: Prior to Admission medications   Medication Sig Start Date End Date Taking? Authorizing Provider  acetaminophen (TYLENOL) 500 MG tablet Take 500 mg by mouth every 6 (six) hours as needed.   Yes [provider]  albuterol (VENTOLIN HFA) 108 (90 Base) MCG/ACT inhaler TAKE 2 PUFFS BY MOUTH EVERY 6 HOURS AS NEEDED FOR WHEEZE OR SHORTNESS OF BREATH 05/29/21  Yes Salena Saner, MD  allopurinol (ZYLOPRIM) 100 MG tablet TAKE 2 TABLETS BY MOUTH EVERY DAY 10/25/22  Yes Bacigalupo, Marzella Schlein, MD  aspirin 81 MG tablet Take 81 mg by mouth daily.   Yes [provider]  bismuth subsalicylate (PEPTO BISMOL) 262 MG chewable tablet Chew 262 mg by mouth as needed. **capsule**   Yes [provider]  calcium carbonate (TUMS - DOSED IN MG ELEMENTAL CALCIUM) 500 MG chewable tablet Chew 1 tablet by mouth as needed for indigestion or heartburn.   Yes [provider]  carboxymethylcellulose (REFRESH PLUS) 0.5 % SOLN Place 1 drop into both eyes 3 (three) times daily as needed (Dry Eyes).   Yes [provider]  carvedilol (COREG) 6.25 MG tablet Take 1 tablet (6.25 mg total) by mouth 2 (two) times daily. 01/29/23  Yes Wittenborn, Gavin Pound, NP  Cholecalciferol (VITAMIN D-3 PO) Take 1 tablet by mouth daily.   Yes [provider]  colestipol (COLESTID) 1 g tablet TAKE 1 TABLET BY MOUTH 2 TIMES DAILY. 10/30/22  Yes Bacigalupo, Marzella Schlein, MD  famotidine (PEPCID) 20 MG tablet TAKE 1 TABLET BY MOUTH TWICE A DAY 02/26/23  Yes Bacigalupo, Marzella Schlein, MD  ferrous sulfate 324 MG TBEC Take  324 mg by mouth daily with breakfast.   Yes [provider]  fluocinonide (LIDEX) 0.05 % external solution APPLY TO AFFECTED AREAS OF SCALP DAILY UNTIL CLEAR, THEN FOR FLARES 10/31/18  Yes [provider]  furosemide (LASIX) 20 MG tablet TAKE 1 TABLET BY MOUTH EVERY DAY 06/04/22  Yes Gollan, Tollie Pizza, MD  lisinopril (ZESTRIL) 10 MG tablet Take 1 tablet (10 mg total) by mouth daily. 05/01/22  Yes Bacigalupo, Marzella Schlein, MD  Multiple Vitamins-Minerals (CENTRUM SILVER PO) Take 1 tablet by mouth daily.   Yes [provider]  omeprazole (PRILOSEC) 40 MG capsule TAKE 1 CAPSULE BY MOUTH TWICE A DAY 10/25/22  Yes Bacigalupo, Marzella Schlein, MD  oxybutynin (DITROPAN-XL) 10 MG 24 hr tablet TAKE 1 TABLET BY MOUTH EVERY DAY 10/25/22  Yes Bacigalupo, Marzella Schlein, MD  potassium chloride (KLOR-CON) 10 MEQ tablet TAKE 1 TABLET BY MOUTH EVERY DAY 06/04/22  Yes Gollan, Tollie Pizza, MD  rosuvastatin (CRESTOR) 10 MG tablet TAKE 1 TABLET BY MOUTH EVERY DAY 02/26/23  Yes Bacigalupo, Marzella Schlein, MD  tirzepatide [facility]) 2.5 MG/0.5ML Pen Inject 2.5 mg into the skin once a week. 02/11/23  Yes Bacigalupo, Marzella Schlein, MD  Venlafaxine HCl 225 MG TB24 TAKE 1 TABLET (225 MG TOTAL) BY MOUTH DAILY. PLEASE SCHEDULE OFFICE VISIT BEFORE ANY FUTURE REFILLS 02/26/23  Yes Erasmo Downer, MD  ARIPiprazole (ABILIFY) 2 MG tablet TAKE 1 TABLET BY MOUTH EVERY DAY 03/01/23   Erasmo Downer, MD  Fluticasone-Umeclidin-Vilant (TRELEGY ELLIPTA) 100-62.5-25 MCG/ACT AEPB Inhale 1 puff into the  lungs daily. Patient not taking: Reported on 02/26/2023 08/08/22   Salena Saner, MD  tirzepatide [facility]) 5 MG/0.5ML Pen Inject 5 mg into the skin once a week. Patient not taking: Reported on 02/26/2023 02/11/23   Erasmo Downer, MD     Vital Signs: BP 125/74 (BP Location: Left Arm)   Pulse 80   Temp 97.8 F (36.6 C) (Oral)   Resp 20   SpO2 90%   Physical Exam Vitals and nursing note reviewed.  Skin:    General: Skin  is warm and dry.  Neurological:     Mental Status: She is oriented to person, place, and time.  Psychiatric:        Mood and Affect: Mood normal.        Behavior: Behavior normal.        Thought Content: Thought content normal.        Judgment: Judgment normal.   Groin: procedure site intact, clean, and dry.  Ecchymosis noted to the left pubis and labia.  Non-tender. Soft. No evidence of hematoma or pseudoaneurysm.   Imaging: IR ANGIO INTRA EXTRACRAN SEL COM CAROTID INNOMINATE BILAT MOD SED Result Date: 03/01/2023 CLINICAL DATA:  Right-sided hemiparesis and dysarthria and aphasia. 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 EXAM: BILATERAL COMMON CAROTID AND INNOMINATE ANGIOGRAPHY COMPARISON:  CT angiogram of the head and neck February 25, 2023. MEDICATIONS: Heparin 2000 units IV; no antibiotic was administered within 1 hour of the procedure. ANESTHESIA/SEDATION: Versed 1 mg IV; Fentanyl 25 mcg IV Moderate Sedation Time:  30 minutes The patient was continuously monitored during the procedure by the interventional radiology nurse under my direct supervision. CONTRAST:  Omnipaque 300 approximately 85 mL. FLUOROSCOPY TIME:  Fluoroscopy Time: 12 minutes 36 seconds (715 mGy). COMPLICATIONS: None immediate. TECHNIQUE: Informed written consent was obtained from the patient after a thorough discussion of the procedural risks, benefits and alternatives. All questions were addressed. Maximal Sterile Barrier Technique was utilized including caps, mask, sterile gowns, sterile gloves, sterile drape, hand hygiene and skin antiseptic. A timeout was performed prior to the initiation of the procedure. The right forearm to the wrist was prepped and draped in the usual sterile manner. The right radial artery was identified with ultrasound, and its morphology documented in the radiology PACS system. A dorsal palmar anastomosis was verified to be present.  Using ultrasound guidance access into the right radial artery was obtained with a 4/5 French radial sheath over an 018 inch micro guidewire. The micro guidewire, and the obturator were removed. Good aspiration was obtained from the side port of the radial sheath. A cocktail of 2000 units of heparin, 200 mcg of nitroglycerin, and 2.5 mcg of verapamil was infused in diluted form without event. A right radial arteriogram was then performed. Over an 035 inch Roadrunner guidewire, a Simmons 2 diagnostic catheter was then advanced to the aortic arch region, and selectively positioned in the origin of the right vertebral artery. Further cannulations of common carotid arteries was not possible due to the patient's anatomy. The left groin was then prepped and draped in the usual sterile fashion. Access was obtained into left femoral artery using the Seldinger technique. Over an 035 inch guidewire, a 5 French Pinnacle sheath was inserted. Through this, and also over an 035 inch Roadrunner guidewire, a 5 [place] JB 1 catheter was advanced to the aortic arch region, and selectively positioned in the in the right common carotid artery  and the left common carotid artery. Arteriograms were then performed extra cranially and intracranially. FINDINGS: The right vertebral artery origin is widely patent. The vessel opacifies to the cranial skull base. Patency is seen of the left vertebrobasilar junction and the left posterior-inferior cerebellar artery. The opacified portions of the basilar artery on the lateral projection demonstrate patency as do the superior cerebellar arteries and the anterior-inferior cerebellar arteries and the posterior cerebral arteries. The right common carotid arteriogram demonstrates the right external carotid artery and its major branches to be widely patent. The right internal carotid artery at the bulb to the cranial skull base is widely patent. The petrous, the cavernous and the supraclinoid right ICA  demonstrate patency. The right middle cerebral artery opacifies into the capillary and venous phases. No angiographic evidence of a right anterior cerebral artery A1 segment was seen. The left common carotid arteriogram demonstrates the left external carotid artery and its major branches to be widely patent. Left internal carotid artery at the bulb demonstrates a pre occlusive stenosis secondary to a circumferential atherosclerotic plaque. Distal to this there is moderate tortuosity of the left internal carotid artery. Distal to this the vessel is seen to opacify to the cranial skull base. The petrous, the cavernous and the supraclinoid left ICA are widely patent. Left posterior communicating artery is seen opacifying the left posterior-inferior cerebellar artery. The left middle cerebral artery and the left anterior cerebral artery opacify into the capillary and venous phases. Cross-filling via the anterior communicating artery of the right anterior cerebral artery A2 segment and distally is seen. 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 PLAN: Findings reviewed with patient, the son and the patient's neurologist. Plan endovascular revascularization of the proximal left internal carotid artery electively following completion of the rehab program. Electronically Signed   By: Julieanne Cotton M.D.   On: 03/01/2023 08:11   IR US Guide Vasc Access Right Result Date: 03/01/2023 CLINICAL DATA:  Right-sided hemiparesis and dysarthria and aphasia. 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 EXAM: BILATERAL COMMON CAROTID AND INNOMINATE ANGIOGRAPHY COMPARISON:  CT angiogram of the head and neck February 25, 2023. MEDICATIONS: Heparin 2000 units IV; no antibiotic was administered within 1 hour of the procedure. ANESTHESIA/SEDATION: Versed 1 mg IV; Fentanyl 25 mcg IV Moderate Sedation Time:  30 minutes  The patient was continuously monitored during the procedure by the interventional radiology nurse under my direct supervision. CONTRAST:  Omnipaque 300 approximately 85 mL. FLUOROSCOPY TIME:  Fluoroscopy Time: 12 minutes 36 seconds (715 mGy). COMPLICATIONS: None immediate. TECHNIQUE: Informed written consent was obtained from the patient after a thorough discussion of the procedural risks, benefits and alternatives. All questions were addressed. Maximal Sterile Barrier Technique was utilized including caps, mask, sterile gowns, sterile gloves, sterile drape, hand hygiene and skin antiseptic. A timeout was performed prior to the initiation of the procedure. The right forearm to the wrist was prepped and draped in the usual sterile manner. The right radial artery was identified with ultrasound, and its morphology documented in the radiology PACS system. A dorsal palmar anastomosis was verified to be present. Using ultrasound guidance access into the right radial artery was obtained with a 4/5 French radial sheath over an 018 inch micro guidewire. The micro guidewire, and the obturator were removed. Good aspiration was obtained from the side port of the radial sheath. A cocktail of 2000 units of heparin, 200  mcg of nitroglycerin, and 2.5 mcg of verapamil was infused in diluted form without event. A right radial arteriogram was then performed. Over an 035 inch Roadrunner guidewire, a Simmons 2 diagnostic catheter was then advanced to the aortic arch region, and selectively positioned in the origin of the right vertebral artery. Further cannulations of common carotid arteries was not possible due to the patient's anatomy. The left groin was then prepped and draped in the usual sterile fashion. Access was obtained into left femoral artery using the Seldinger technique. Over an 035 inch guidewire, a 5 French Pinnacle sheath was inserted. Through this, and also over an 035 inch Roadrunner guidewire, a 5 [place] JB 1 catheter  was advanced to the aortic arch region, and selectively positioned in the in the right common carotid artery and the left common carotid artery. Arteriograms were then performed extra cranially and intracranially. FINDINGS: The right vertebral artery origin is widely patent. The vessel opacifies to the cranial skull base. Patency is seen of the left vertebrobasilar junction and the left posterior-inferior cerebellar artery. The opacified portions of the basilar artery on the lateral projection demonstrate patency as do the superior cerebellar arteries and the anterior-inferior cerebellar arteries and the posterior cerebral arteries. The right common carotid arteriogram demonstrates the right external carotid artery and its major branches to be widely patent. The right internal carotid artery at the bulb to the cranial skull base is widely patent. The petrous, the cavernous and the supraclinoid right ICA demonstrate patency. The right middle cerebral artery opacifies into the capillary and venous phases. No angiographic evidence of a right anterior cerebral artery A1 segment was seen. The left common carotid arteriogram demonstrates the left external carotid artery and its major branches to be widely patent. Left internal carotid artery at the bulb demonstrates a pre occlusive stenosis secondary to a circumferential atherosclerotic plaque. Distal to this there is moderate tortuosity of the left internal carotid artery. Distal to this the vessel is seen to opacify to the cranial skull base. The petrous, the cavernous and the supraclinoid left ICA are widely patent. Left posterior communicating artery is seen opacifying the left posterior-inferior cerebellar artery. The left middle cerebral artery and the left anterior cerebral artery opacify into the capillary and venous phases. Cross-filling via the anterior communicating artery of the right anterior cerebral artery A2 segment and distally is seen. IMPRESSION: Severe  pre occlusive stenosis of the proximal left ICA at the bulb as described. PLAN: Findings reviewed with patient, the son and the patient's neurologist. Plan endovascular revascularization of the proximal left internal carotid artery electively following completion of the rehab program. Electronically Signed   By: Julieanne Cotton M.D.   On: 03/01/2023 08:11   DG Swallowing Func-Speech Pathology Result Date: 02/27/2023 Table formatting from the original result was not included. Modified Barium Swallow Study Patient Details Name: Natasha Vasquez MRN: [id number] Date of Birth: [birth year] Today's Date: 02/27/2023 HPI/PMH: HPI: Natasha Vasquez is a 83 y.o. female presented to the [facility] ED 2/10 for evaluation of right-sided weakness and balance problems. 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 Patient transferred to [facility] same day for potential arteriogram for 02/26/23. PHMx: HTN, T2DM, HLD, depression, OSA with CPAP, osteoarthritis, ascending thoracic aortic aneurysm, asthma and GERD, Barretts esophagus. BSE 2/10 at [facility] Vasquez revealed s/s pharyngeal dysphagia and recommended Dys 3, nectar thick liquids with right head turn until MBS can be performed. Clinical Impression: Clinical Impression: Patient presents with a pharyngeal  phase dysphagia as per this MBS. Swallow was initated at level of pyriform sinus with liquids and vallecular sinus with puree solids. Nectar thick liquids via cup sips with head in neutral position resulted in shallow penetration (PAS) but no aspiration. With thin liquids, she had consistent, deep penetration to vocal cords (PAS 4) with cup sips. This led to one incident of silent aspiration (PAS 8) of cup sip of thin liquids. Chin tuck posture with cup and straw sips of thin liquids resulted in reduction of depth of penetration and prevented aspiration. 13mm barium tablet taken with thin liquids with chin tuck posture, resulted in tablet  becoming lodged in vallecular sinus. Puree solids helped to transit tablet fully. No significant amount of residuals observed s/p initial swallows with any of the tested consistencies. 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 SLP recommending regular solids, thin liquids with chin tuck. Recommend taking medications whole with puree/pudding texture. Recommendations/Plan: Swallowing Evaluation Recommendations Swallowing Evaluation Recommendations Recommendations: PO diet PO Diet Recommendation: Regular; Thin liquids (Level 0) Liquid Administration via: Cup; Straw Medication Administration: Whole meds with puree Supervision: Patient able to self-feed Swallowing strategies  : Slow rate; Small bites/sips; Chin tuck Postural changes: Position pt fully upright for meals Oral care recommendations: Oral care BID (2x/day) Treatment Plan Treatment Plan Treatment recommendations: Therapy as outlined in treatment plan below Follow-up recommendations: No SLP follow up Functional status assessment: Patient has had a recent decline in their functional status and demonstrates the ability to make significant improvements in function in a reasonable and predictable amount of time. Treatment frequency: Min 1x/week Treatment duration: 1 week Interventions: Aspiration precaution training; Diet toleration management by SLP; Patient/family education Recommendations Recommendations for follow up therapy are one component of a multi-disciplinary discharge planning process, led by the attending physician.  Recommendations may be updated based on patient status, additional functional criteria and insurance authorization. Assessment: Orofacial Exam: Orofacial Exam Oral Cavity: Oral Hygiene: WFL Oral Cavity - Dentition: Adequate natural dentition Orofacial Anatomy: WFL Anatomy: Anatomy: Prominent cricopharyngeus Boluses Administered: Boluses  Administered Boluses Administered: Thin liquids (Level 0); Mildly thick liquids (Level 2, nectar thick); Puree  Oral Impairment Domain: Oral Impairment Domain Lip Closure: No labial escape Tongue control during bolus hold: Cohesive bolus between tongue to palatal seal Bolus preparation/mastication: Timely and efficient chewing and mashing Bolus transport/lingual motion: Brisk tongue motion Oral residue: Complete oral clearance Location of oral residue : N/A Initiation of pharyngeal swallow : Pyriform sinuses; Valleculae  Pharyngeal Impairment Domain: Pharyngeal Impairment Domain Soft palate elevation: No bolus between soft palate (SP)/pharyngeal wall (PW) Laryngeal elevation: Partial superior movement of thyroid cartilage/partial approximation of arytenoids to epiglottic petiole Anterior hyoid excursion: Partial anterior movement Epiglottic movement: Complete inversion Laryngeal vestibule closure: Complete, no air/contrast in laryngeal vestibule Pharyngeal stripping wave : Present - complete Pharyngeal contraction (A/P view only): N/A Pharyngoesophageal segment opening: Complete distension and complete duration, no obstruction of flow Tongue base retraction: No contrast between tongue base and posterior pharyngeal wall (PPW) Pharyngeal residue: Complete pharyngeal clearance Location of pharyngeal residue: N/A  Esophageal Impairment Domain: Esophageal Impairment Domain Esophageal clearance upright position: Complete clearance, esophageal coating Pill: Pill Consistency administered: Thin liquids (Level 0); Puree Thin liquids (Level 0): Impaired (see clinical impressions) Puree: WFL Penetration/Aspiration Scale Score: Penetration/Aspiration Scale Score 1.  Material does not enter airway: Puree 2.  Material enters airway, remains ABOVE vocal cords then ejected out: Mildly thick liquids (Level 2, nectar thick);  Thin liquids (Level 0) 4.  Material enters airway, CONTACTS cords then ejected out: Thin liquids (Level 0) 8.   Material enters airway, passes BELOW cords without attempt by patient to eject out (silent aspiration) : Thin liquids (Level 0) Compensatory Strategies: Compensatory Strategies Compensatory strategies: Yes Chin tuck: Effective Effective Chin Tuck: Thin liquid (Level 0)   General Information: No data recorded Diet Prior to this Study: Dysphagia 3 (mechanical soft); Mildly thick liquids (Level 2, nectar thick)   Temperature : Normal   Respiratory Status: WFL   Supplemental O2: None (Room air)   History of Recent Intubation: No  Behavior/Cognition: Alert; Cooperative; Pleasant mood Self-Feeding Abilities: Able to self-feed Baseline vocal quality/speech: Normal Volitional Cough: Able to elicit Volitional Swallow: Able to elicit Exam Limitations: No limitations Goal Planning: Prognosis for improved oropharyngeal function: Good No data recorded Barriers/Prognosis Comment: GERD/Barrett's esophagus; bell's palsy baseline No data recorded Consulted and agree with results and recommendations: Patient Pain: Pain Assessment Pain Assessment: No/denies pain Pain Score: 0 Faces Pain Scale: 2 Pain Location: R hip Pain Descriptors / Indicators: Spasm; Discomfort Pain Intervention(s): Monitored during session; Repositioned End of Session: Start Time:SLP Start Time (ACUTE ONLY): 1430 Stop Time: SLP Stop Time (ACUTE ONLY): 1450 Time Calculation:SLP Time Calculation (min) (ACUTE ONLY): 20 min Charges: SLP Evaluations $ SLP Speech Visit: 1 Visit SLP Evaluations $BSS Swallow: 1 Procedure $MBS Swallow: 1 Procedure $ SLP EVAL LANGUAGE/SOUND PRODUCTION: 1 Procedure SLP visit diagnosis: SLP Visit Diagnosis: Dysphagia, pharyngeal phase (R13.13) Past Medical History: Past Medical History: Diagnosis Date  Anemia in pregnancy   Arthritis   hands  Barrett esophagus   Bell palsy 04/24/2004  Breast neoplasm, Tis (DCIS), 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  ER/PR NEGATIVE  Cataract   Chronic cough   Complication of anesthesia   20 years ago pts b/p  dropped really low, "coded"  Depression   Depression   Diabetes mellitus without complication (HCC)   diet controlled  Family history of adverse reaction to anesthesia   son PONV  GERD (gastroesophageal reflux disease)   Gout   Hypercholesteremia   Hyperlipidemia   Hypertension   Melanoma (HCC)   MVP (mitral valve prolapse)   followed by PCP  Nephritis   Nephritis   S/P appendectomy   Thoracic aortic aneurysm (HCC)   Torn meniscus   left Past Surgical History: Past Surgical History: Procedure Laterality Date  ABDOMINAL HYSTERECTOMY    APPENDECTOMY    BREAST BIOPSY Right 04/25/2017  retroareolar 10:00   wing clip   path pending  BREAST BIOPSY Right 04/25/2017  10:00  5CMFN  venus clip    path pending  BREAST BIOPSY Right 05/03/2017  Affirm Bx- path pending  BREAST CYST ASPIRATION Bilateral   NEG  BREAST EXCISIONAL BIOPSY Left 20+ yrs ago  NEG  BREAST RECONSTRUCTION WITH PLACEMENT OF TISSUE EXPANDER AND FLEX HD (ACELLULAR HYDRATED DERMIS) Right 06/03/2017  Procedure: BREAST RECONSTRUCTION WITH PLACEMENT OF TISSUE EXPANDER AND FLEX HD (ACELLULAR HYDRATED DERMIS);  Surgeon: Peggye Form, DO;  Location: [facility];  Service: Plastics;  Laterality: Right;  BREAST REDUCTION SURGERY Left 11/06/2017  Procedure: BREAST REDUCTION;  Surgeon: Peggye Form, DO;  Location: [facility];  Service: Plastics;  Laterality: Left;  BROW LIFT Bilateral 02/01/2015  Procedure: BLEPHAROPLASTY bilateral upper eyelids.;  Surgeon: Imagene Riches, MD;  Location: [facility];  Service: Ophthalmology;  Laterality: Bilateral;  DIABETIC - diet controlled  CATARACT EXTRACTION W/PHACO Left 01/14/2019  Procedure: CATARACT EXTRACTION PHACO AND INTRAOCULAR  LENS PLACEMENT (IOC) LEFT TECNIS TORIC ADD 5.62 00:46.5 30.0%;  Surgeon: Lockie Mola, MD;  Location: [facility];  Service: Ophthalmology;  Laterality: Left;  diabetic - diet controlled  CATARACT EXTRACTION W/PHACO Right 02/11/2019  Procedure: CATARACT EXTRACTION PHACO  AND INTRAOCULAR LENS PLACEMENT (IOC) RIGHT DIABETIC 4.82 00:46.9 10.3%;  Surgeon: Lockie Mola, MD;  Location: [facility];  Service: Ophthalmology;  Laterality: Right;  Diabetic - diet controlled  CHOLECYSTECTOMY    COLONOSCOPY    COLONOSCOPY WITH PROPOFOL N/A 05/11/2015  Procedure: COLONOSCOPY WITH PROPOFOL;  Surgeon: Scot Jun, MD;  Location: [facility];  Service: Endoscopy;  Laterality: N/A;  COLONOSCOPY WITH PROPOFOL N/A 09/07/2020  Procedure: COLONOSCOPY WITH PROPOFOL;  Surgeon: [place], Boykin Nearing, MD;  Location: [facility] ENDOSCOPY;  Service: Gastroenterology;  Laterality: N/A;  ESOPHAGOGASTRODUODENOSCOPY    ESOPHAGOGASTRODUODENOSCOPY (EGD) WITH PROPOFOL  05/11/2015  Procedure: ESOPHAGOGASTRODUODENOSCOPY (EGD) WITH PROPOFOL;  Surgeon: Scot Jun, MD;  Location: [facility] ENDOSCOPY;  Service: Endoscopy;;  FRACTURE SURGERY Right   arm and shoulder  JOINT REPLACEMENT    KNEE ARTHROSCOPY Left 03/14/2016  Procedure: ARTHROSCOPY KNEE, PARTIAL MEDIAL MENISECTOMY, CHONDROPLASTY;  Surgeon: Donato Heinz, MD;  Location: [facility] ORS;  Service: Orthopedics;  Laterality: Left;  MASTECTOMY Right 2019  MASTECTOMY W/ SENTINEL NODE BIOPSY Right 06/03/2017  Procedure: MASTECTOMY WITH SENTINEL LYMPH NODE BIOPSY;  Surgeon: Earline Mayotte, MD;  Location: [facility] ORS;  Service: General;  Laterality: Right;  PARTIAL HIP ARTHROPLASTY Right   REDUCTION MAMMAPLASTY Left 2019  REMOVAL OF BILATERAL TISSUE EXPANDERS WITH PLACEMENT OF BILATERAL BREAST IMPLANTS Right 11/06/2017  Procedure: REMOVAL OF TISSUE EXPANDER WITH PLACEMENT OF BREAST IMPLANT;  Surgeon: Peggye Form, DO;  Location: [facility] ORS;  Service: Plastics;  Laterality: Right;  TONSILLECTOMY   Angela Nevin, MA, CCC-SLP Speech Therapy   ECHOCARDIOGRAM COMPLETE Result Date: 02/26/2023    ECHOCARDIOGRAM REPORT   Patient Name:   Natasha Vasquez Date of Exam: 02/26/2023 Medical Rec #:  [id number]         Height:       64.0 in Accession #:    [id number]         Weight:       196.0 lb Date of Birth:  December 30, 1940          BSA:          1.940 m Patient Age:    82 years          BP:           174/94 mmHg Patient Gender: F                 HR:           72 bpm. Exam Location:  Inpatient Procedure: 2D Echo, Cardiac Doppler, Color Doppler, Saline Contrast Bubble Study            and Intracardiac Opacification Agent Indications:    Stroke  History:        Patient has prior history of Echocardiogram examinations, most                 recent 09/05/2022. Risk Factors:Hypertension, Diabetes,                 Dyslipidemia and Former Smoker.  Sonographer:    Karma Ganja Referring Phys: [id number] PROSPER M AMPONSAH  Sonographer Comments: Technically difficult study due to poor echo windows and patient is obese. IMPRESSIONS  1. Left ventricular ejection fraction, by estimation, is 55 to 60%. The  left ventricle has normal function. The left ventricle has no regional wall motion abnormalities. There is mild asymmetric left ventricular hypertrophy of the septal segment. Left ventricular diastolic parameters are consistent with Grade I diastolic dysfunction (impaired relaxation).  2. Right ventricular systolic function is normal. The right ventricular size is normal. There is normal pulmonary artery systolic pressure. The estimated right ventricular systolic pressure is 30.9 mmHg.  3. The mitral valve is grossly normal. No evidence of mitral valve regurgitation. No evidence of mitral stenosis.  4. The aortic valve is tricuspid. There is mild calcification of the aortic valve. There is mild thickening of the aortic valve. Aortic valve regurgitation is not visualized. Aortic valve sclerosis is present, with no evidence of aortic valve stenosis.  5. The inferior vena cava is normal in size with greater than 50% respiratory variability, suggesting right atrial pressure of 3 mmHg.  6. Agitated saline contrast bubble study was negative, with no evidence of any interatrial shunt. Comparison(s): No  significant change from prior study. Conclusion(s)/Recommendation(s): No intracardiac source of embolism detected on this transthoracic study. Consider a transesophageal echocardiogram to exclude cardiac source of embolism if clinically indicated. FINDINGS  Left Ventricle: Left ventricular ejection fraction, by estimation, is 55 to 60%. The left ventricle has normal function. The left ventricle has no regional wall motion abnormalities. Definity contrast agent was given IV to delineate the left ventricular  endocardial borders. The left ventricular internal cavity size was normal in size. There is mild asymmetric left ventricular hypertrophy of the septal segment. Left ventricular diastolic parameters are consistent with Grade I diastolic dysfunction (impaired relaxation). Right Ventricle: The right ventricular size is normal. No increase in right ventricular wall thickness. Right ventricular systolic function is normal. There is normal pulmonary artery systolic pressure. The tricuspid regurgitant velocity is 2.64 m/s, and  with an assumed right atrial pressure of 3 mmHg, the estimated right ventricular systolic pressure is 30.9 mmHg. Left Atrium: Left atrial size was normal in size. Right Atrium: Right atrial size was normal in size. Pericardium: There is no evidence of pericardial effusion. Presence of epicardial fat layer. Mitral Valve: The mitral valve is grossly normal. No evidence of mitral valve regurgitation. No evidence of mitral valve stenosis. Tricuspid Valve: The tricuspid valve is grossly normal. Tricuspid valve regurgitation is trivial. No evidence of tricuspid stenosis. Aortic Valve: The aortic valve is tricuspid. There is mild calcification of the aortic valve. There is mild thickening of the aortic valve. Aortic valve regurgitation is not visualized. Aortic valve sclerosis is present, with no evidence of aortic valve stenosis. Aortic valve mean gradient measures 6.0 mmHg. Aortic valve peak gradient  measures 10.9 mmHg. Aortic valve area, by VTI measures 2.64 cm. Pulmonic Valve: The pulmonic valve was grossly normal. Pulmonic valve regurgitation is not visualized. No evidence of pulmonic stenosis. Aorta: The aortic root is normal in size and structure. Venous: The inferior vena cava is normal in size with greater than 50% respiratory variability, suggesting right atrial pressure of 3 mmHg. IAS/Shunts: The atrial septum is grossly normal. Agitated saline contrast was given intravenously to evaluate for intracardiac shunting. Agitated saline contrast bubble study was negative, with no evidence of any interatrial shunt.  LEFT VENTRICLE PLAX 2D LVIDd:         4.10 cm     Diastology LVIDs:         2.70 cm     LV e' medial:    5.77 cm/s LV PW:  0.90 cm     LV E/e' medial:  12.9 LV IVS:        1.20 cm     LV e' lateral:   9.68 cm/s LVOT diam:     2.10 cm     LV E/e' lateral: 7.7 LV SV:         81 LV SV Index:   42 LVOT Area:     3.46 cm  LV Volumes (MOD) LV vol d, MOD A2C: 83.3 ml LV vol d, MOD A4C: 63.6 ml LV vol s, MOD A2C: 30.2 ml LV vol s, MOD A4C: 36.0 ml LV SV MOD A2C:     53.1 ml LV SV MOD A4C:     63.6 ml LV SV MOD BP:      38.7 ml RIGHT VENTRICLE             IVC RV Basal diam:  3.60 cm     IVC diam: 1.60 cm RV S prime:     14.30 cm/s TAPSE (M-mode): 2.6 cm LEFT ATRIUM             Index        RIGHT ATRIUM           Index LA diam:        3.90 cm 2.01 cm/m   RA Area:     17.60 cm LA Vol (A2C):   94.1 ml 48.51 ml/m  RA Volume:   46.90 ml  24.18 ml/m LA Vol (A4C):   43.1 ml 22.22 ml/m LA Biplane Vol: 62.9 ml 32.43 ml/m  AORTIC VALVE AV Area (Vmax):    2.62 cm AV Area (Vmean):   2.65 cm AV Area (VTI):     2.64 cm AV Vmax:           165.00 cm/s AV Vmean:          111.000 cm/s AV VTI:            0.306 m AV Peak Grad:      10.9 mmHg AV Mean Grad:      6.0 mmHg LVOT Vmax:         125.00 cm/s LVOT Vmean:        84.900 cm/s LVOT VTI:          0.233 m LVOT/AV VTI ratio: 0.76  AORTA Ao Root diam: 3.50 cm  Ao Asc diam:  3.30 cm MITRAL VALVE               TRICUSPID VALVE MV Area (PHT): 2.93 cm    TR Peak grad:   27.9 mmHg MV Decel Time: 259 msec    TR Vmax:        264.00 cm/s MV E velocity: 74.40 cm/s MV A velocity: 89.60 cm/s  SHUNTS MV E/A ratio:  0.83        Systemic VTI:  0.23 m                            Systemic Diam: 2.10 cm Lennie Odor MD Electronically signed by Lennie Odor MD Signature Date/Time: 02/26/2023/10:56:16 AM    Final    DG HIP UNILAT WITH PELVIS 1V RIGHT Result Date: 02/26/2023 CLINICAL DATA:  83 year old female with history of right-sided hip pain. EXAM: DG HIP (WITH OR WITHOUT PELVIS) 1V RIGHT COMPARISON:  No priors. FINDINGS: AP view of the bony pelvis and AP view of the right hip demonstrate no definite acute displaced  fracture of the bony pelvic ring. Bilateral proximal femurs as visualized appear intact. Patient is status post right hip hemiarthroplasty. Prosthetic femoral head projects within the acetabulum on this single view examination. No definite periprosthetic fracture or other acute abnormality. Iodinated contrast material is noted within the lumen of the urinary bladder, presumably related to recent contrast-enhanced CT examinations. IMPRESSION: 1. No acute radiographic abnormality of the bony pelvis or the right hip. 2. Status post right hip hemiarthroplasty. Electronically Signed   By: Trudie Reed M.D.   On: 02/26/2023 06:12   MR BRAIN WO CONTRAST Result Date: 02/25/2023 CLINICAL DATA:  Neuro deficit, acute, stroke suspected. Right-sided weakness. EXAM: MRI HEAD WITHOUT CONTRAST TECHNIQUE: Multiplanar, multiecho pulse sequences of the brain and surrounding structures were obtained without intravenous contrast. COMPARISON:  CT studies same day. FINDINGS: Brain: Diffusion imaging shows acute infarction within the left midbrain and left cerebral peduncle. No other acute infarction. 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 No focal cerebellar insult. Cerebral  hemispheres otherwise show mild chronic small-vessel ischemic change of the white matter. No large vessel territory stroke. No mass lesion, hemorrhage, hydrocephalus or extra-axial collection. Vascular: Major vessels at the base of the brain show flow. Skull and upper cervical spine: Negative Sinuses/Orbits: Clear/normal Other: None IMPRESSION: 1. 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 2. 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 3. Mild chronic small-vessel ischemic change of the cerebral hemispheric white matter. Electronically Signed   By: Paulina Fusi M.D.   On: 02/25/2023 19:55   CT ANGIO HEAD NECK W WO CM Result Date: 02/25/2023 CLINICAL DATA:  Neuro deficit, acute, stroke suspected. Extremity weakness. Facial droop. Hypertension and diabetes. EXAM: CT ANGIOGRAPHY HEAD AND NECK WITH AND WITHOUT CONTRAST TECHNIQUE: Multidetector CT imaging of the head and neck was performed using the standard protocol during bolus administration of intravenous contrast. Multiplanar CT image reconstructions and MIPs were obtained to evaluate the vascular anatomy. Carotid stenosis measurements (when applicable) are obtained utilizing NASCET criteria, using the distal internal carotid diameter as the denominator. RADIATION DOSE REDUCTION: This exam was performed according to the departmental dose-optimization program which includes automated exposure control, adjustment of the mA and/or kV according to patient size and/or use of iterative reconstruction technique. CONTRAST:  60mL OMNIPAQUE IOHEXOL 350 MG/ML SOLN COMPARISON:  Head CT earlier same day.  Ultrasound 04/28/2021. FINDINGS: CTA NECK FINDINGS Aortic arch: Aortic atherosclerosis. Branching pattern is normal without origin stenosis. Right carotid system: Common carotid artery widely patent to the bifurcation. No soft or calcified plaque at the bifurcation. Right ICA does not show any focal stenosis but is smaller than the left. Left  carotid system: Common carotid artery widely patent to the bifurcation. Calcified plaque at the carotid bifurcation and ICA bulb. Minimal diameter in the ICA bulb less than 1 mm. Near occlusion. Vessel regains a more normal caliber at the distal bulb and is patent through the cervical region to the skull base beyond that. Vertebral arteries: Both vertebral artery origins widely patent. Both vertebral arteries patent through the cervical region to the foramen magnum. Skeleton: Chronic mid cervical spondylosis. Other neck: No mass or lymphadenopathy. Upper chest: Scarring at the lung apices.  No acute finding. Review of the MIP images confirms the above findings CTA HEAD FINDINGS Anterior circulation: Both internal carotid arteries are patent through the skull base and siphon regions. Ordinary siphon atherosclerotic calcification but no stenosis greater than 30%. The anterior and middle cerebral vessels are patent. Both anterior  cerebral arteries receive there supply from the left carotid circulation. Right internal carotid artery supplies only the middle cerebral artery territory. No large vessel occlusion. No proximal stenosis. No aneurysm or vascular malformation. Posterior circulation: Both vertebral arteries patent through the foramen magnum to the basilar artery. No basilar stenosis. Posterior circulation branch vessels are patent. Left PCA receives most of it supply from the anterior circulation. Venous sinuses: Patent and normal. Anatomic variants: None significant otherwise. Review of the MIP images confirms the above findings IMPRESSION: 1. Aortic atherosclerosis. 2. Calcified plaque at the left carotid bifurcation and ICA bulb. Minimal diameter in the ICA bulb less than 1 mm. Near occlusion. Vessel regains a more normal caliber at the distal bulb and is patent through the cervical region to the skull base beyond that. This is of particular importance in this patient as the left internal carotid artery  supplies the majority of the brain, as the right ICA essentially gives isolated supply to the right middle cerebral artery territory only. Urgent vascular surgery consultation suggested. 3. No intracranial large or medium vessel occlusion or correctable proximal stenosis. Electronically Signed   By: Paulina Fusi M.D.   On: 02/25/2023 15:13    Labs:  CBC: Recent Labs    09/07/22 1604 02/25/23 1113 02/26/23 0317 02/27/23 0334  WBC 10.4 7.1 8.9 8.4  HGB 12.9 12.4 12.5 12.5  HCT 40.1 38.4 37.8 37.1  PLT 341 301 288 269    COAGS: Recent Labs    02/25/23 1113  INR 1.0  APTT 28    BMP: Recent Labs    02/26/23 0317 02/27/23 0334 02/28/23 0349 03/01/23 0348  NA 141 140 141 138  K 3.6 3.5 3.7 3.6  CL 104 101 106 104  CO2 24 24 24 24   GLUCOSE 107* 131* 109* 125*  BUN 10 17 20 16   CALCIUM 9.6 9.4 9.1 8.9  CREATININE 0.91 0.85 0.95 0.88  GFRNONAA >60 >60 60* >60    LIVER FUNCTION TESTS: Recent Labs    05/18/22 1610 02/11/23 0945 02/25/23 1113  BILITOT 0.2 0.4 0.6  AST 21 30 34  ALT 21 34* 40  ALKPHOS 102 105 71  PROT 6.8 7.0 7.3  ALBUMIN 4.6 4.7 4.5    Assessment and Plan: 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  Some stable bruising noted without concern or hematoma or pseudoaneurysm.  Rehab authorization denied, patient to discharge home with OP services.  Will plan to schedule for follow-up with Dr. Corliss Skains in 2 weeks to discuss treatment plan.    Electronically Signed: Hoyt Koch, PA 03/01/2023, 12:45 PM   I spent a total of 15 Minutes at the the patient's bedside AND on the patient's hospital floor or unit, greater than 50% of which was counseling/coordinating care for left ICA stenosis.

## 2023-03-01 NOTE — Discharge Summary (Signed)
Physician Discharge Summary  Natasha Vasquez ZOX:[id number] DOB: 02-06-1940 DOA: 02/25/2023  PCP: Erasmo Downer, MD  Admit date: 02/25/2023 Discharge date: 03/01/2023  Time spent: 60 minutes  Recommendations for Outpatient Follow-up:  Follow-up with Erasmo Downer, MD in 2 weeks.  On follow-up patient will need a basic metabolic profile done to follow-up on electrolytes and renal function.  Patient's blood pressure will need to be reassessed.  Patient will need risk factor modification.  Patient will need repeat LFTs and c-Met done in about 6 weeks. Follow-up with Dr. Pearlean Brownie, [facility] neurology in one 1 month. Follow-up with Dr.Deveshwar, Neurointerventional radiology in 2 weeks.  Office will call with appointment time.   Discharge Diagnoses:  Principal Problem:   CVA (cerebrovascular accident) (HCC) Active Problems:   GERD (gastroesophageal reflux disease)   Hypertension associated with diabetes (HCC)   Type 2 diabetes mellitus (HCC)   Dysphagia   Diabetic peripheral neuropathy associated with type 2 diabetes mellitus (HCC)   HLD (hyperlipidemia)   ICAO (internal carotid artery occlusion), left   Brainstem infarct, acute (HCC)   Discharge Condition: Stable and improved.  Diet recommendation: Heart healthy  There were no vitals filed for this visit.  History of present illness:  HPI per Dr. Conception Chancy Acquanetta Cabanilla is a 83 y.o. female with medical history significant for HTN, T2DM, HLD, depression, OSA with CPAP, osteoarthritis, ascending thoracic aortic aneurysm, asthma and GERD who presented to the [facility] ED for evaluation of right-sided weakness and balance problems.  Patient reports that she woke up around 3 AM and ambulated to use the bathroom.  She went back to sleep and after waking up around 7:30 AM, she noticed some right-sided weakness and off balance while walking.  She felt wobbly on her feet with difficulty with coordination while walking and could tell  that she was not talking right. She denies any numbness, tingling, headaches, vision changes, falls, chest pain, palpitation, dysuria or shortness of breath.  She endorses chronic cough and intermittent wheezing.   [facility] ED Course: Vitals stable.  EKG shows sinus rhythm with multiple PVCs. Patient was not a candidate for TNK due to presenting outside the window.  NIHSS score of 3.  CT head did not show any intracranial abnormalities. CTA head and neck showed calcified plaque at the left carotic bifurcation and ICA above with near occlusion.  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  Patient was eval by SLP and a dysphagia diet was recommended due to patient failing her swallow screen.  Neurology was consulted for evaluation and they discussed her left ICA stenosis with neurointerventionalist at [facility], Dr. Corliss Skains, who recommended that she be transferred to [facility] hospitalist service for stroke workup and tentatively plan for diagnostic arteriogram with intent to treat likely on Wednesday.    Hospital Course:  #1 acute ischemic stroke/left midbrain infarct/acute left cerebral peduncle infarct/asymptomatic high-grade proximal left ICA stenosis -Patient noted to have presented with acute onset of right-sided weakness and balance problems and noted to have evidence of acute infarct in the left midbrain and cerebral peduncle. -CT head done on admission with age-indeterminate superior cerebellar infarct. -CT angiogram head and neck done with calcified plaque at the left carotid bifurcation and left ICA bulb with new occlusion of the left ICA bulb. -MRI brain done with acute infarct in the left midbrain and left cerebral peduncle, old small vessel infarction and cerebral pons, mild small vessel ischemic changes. -2D echo done with a  EF of 55 to 60%, mild LVH, grade 1 DD, normal left atrial size, normal atrial septum with no evidence of interarterial shunt  noted. -Fasting lipid panel with LDL of 81. -Hemoglobin A1c 6.6. -Patient being followed by neurology who are recommending will need repeat MRI in 3 months follow-up. -Neurology recommended DAPT with aspirin 81 mg daily and clopidogrel 75 mg daily x 3 weeks then clopidogrel alone. -Patient being followed by PT/OT who are recommending CIR placement. -Patient noted to have been denied CIR and as such patient be discharged home with home health.   -Outpatient follow-up with neurology.     2.  Severe left ICA stenosis -CT angiogram head and neck with new occlusion of left ICA. -Patient seen in consultation by neurology as well as interventional neurology and patient subsequently underwent right vertebral angiogram and bilateral common carotid arteriograms with severe preocclusive stenosis of the proximal left ICA noted. -Neurology discussed findings with patient and family and per neurology prefer to have revascularization done and before angioplasty stenting or carotid surgery. -Neurology recommended continued ongoing therapy with rehab with elective left carotid revascularization in 2 to 4 weeks with Dr. Corliss Skains. -Outpatient follow-up will be arranged with Dr. Corliss Skains. -Outpatient follow-up with neurology.   3.  Hyperlipidemia -Fasting lipid panel with LDL of 81 with goal LDL < 70. -Crestor dose increased to 20 mg daily. -Outpatient follow-up.    4.  Hypertension -Initially on presentation patient was on permissive hypertension for initial 24 to 48 hours and subsequently home regimen Coreg resumed.   -BP remained stable started trending up and patient's home regimen lisinopril subsequently resumed.   -Patient's Lasix was held during the hospitalization will be resumed 1 week postdischarge.   -Outpatient follow-up with PCP.    5.  Well-controlled type 2 diabetes mellitus -Hemoglobin A1c 6.6 (02/11/2023) -Noted to be on Mounjaro prior to admission which was held during the  hospitalization will be resumed on discharge.   -Patient maintained on SSI during the hospitalization.   -Outpatient follow-up with PCP.     6.  Post stroke dysphagia -Being followed by SLP. -Patient able to tolerate a heart healthy diet.   -Patient was discharged with home health SLP.    7.  Right hip pain -Patient with complaints of right hip pain/discomfort intermittent in nature and describes as a spasm/aching. -Patient with history of osteoarthritis. -Patient denied any recent falls. -Hip nontender to palpation. -Plain films of the hip done with no acute abnormalities. -Patient placed on Robaxin as needed.    8.  Asthma -Stable. -Nebs as needed.   9.  OSA -CPAP nightly.      Procedures: CT angiogram head and neck 02/25/2023 CT angiogram head and neck 02/25/2023 MRI brain 02/25/2023 2D echo 02/26/2023 Right vertebral angiogram and bilateral common carotid arteriograms.  Neurovascular intervention: Dr.Deveshwar 02/27/2023    Consultations: Neurology: Dr. Selina Cooley 02/25/2023 Neurointerventional radiology: Dr. Corliss Skains 02/26/2023 Inpatient rehab  Discharge Exam: Vitals:   03/01/23 0810 03/01/23 1135  BP: (!) 143/93 125/74  Pulse: 69 80  Resp: 18 20  Temp: 98.3 F (36.8 C) 97.8 F (36.6 C)  SpO2: 95% 90%    General: NAD Cardiovascular: RRR no murmurs rubs or gallops.  No JVD.  No lower extremity edema. Respiratory: Clear to auscultation bilaterally.  No wheezes, no crackles, no rhonchi.  Fair air movement.  Speaking in full sentences.  Discharge Instructions   Discharge Instructions     Ambulatory referral to Neurology   Complete by: As directed  An appointment is requested in approximately: 4 weeks   Diet - low sodium heart healthy   Complete by: As directed    Increase activity slowly   Complete by: As directed    No wound care   Complete by: As directed       Allergies as of 03/01/2023       Reactions   Codeine Other (See Comments)   Chest pain    Quinolones    PATIENT UNAWARE OF allergy to this class of antibiotics, only aware of allergy to [facility] Patient was warned about not using Cipro and similar antibiotics. Recent studies have raised concern that fluoroquinolone antibiotics could be associated with an increased risk of aortic aneurysm Fluoroquinolones have non-antimicrobial properties that might jeopardise the integrity of the extracellular matrix of the vascular wall In a  propensity score matched cohort study in [place], there was a 66% increased rate   Aleve [naproxen Sodium] Hives, Rash, Other (See Comments)   headaches   Lorazepam Other (See Comments)   "Feels loopy"   Oxycodone Other (See Comments)   MENTAL CHANGES   Paregoric Other (See Comments)   Chest pain---tolerated MORPHINE   Penicillins Hives   Can take Augmentin        Medication List     PAUSE taking these medications    furosemide 20 MG tablet Wait to take this until: March 08, 2023 Commonly known as: LASIX TAKE 1 TABLET BY MOUTH EVERY DAY       TAKE these medications    acetaminophen 500 MG tablet Commonly known as: TYLENOL Take 500 mg by mouth every 6 (six) hours as needed.   albuterol 108 (90 Base) MCG/ACT inhaler Commonly known as: VENTOLIN HFA TAKE 2 PUFFS BY MOUTH EVERY 6 HOURS AS NEEDED FOR WHEEZE OR SHORTNESS OF BREATH   allopurinol 100 MG tablet Commonly known as: ZYLOPRIM TAKE 2 TABLETS BY MOUTH EVERY DAY   ARIPiprazole 2 MG tablet Commonly known as: ABILIFY TAKE 1 TABLET BY MOUTH EVERY DAY   aspirin 81 MG tablet Take 1 tablet (81 mg total) by mouth daily for 21 days.   bismuth subsalicylate 262 MG chewable tablet Commonly known as: PEPTO BISMOL Chew 262 mg by mouth as needed. **capsule**   calcium carbonate 500 MG chewable tablet Commonly known as: TUMS - dosed in mg elemental calcium Chew 1 tablet by mouth as needed for indigestion or heartburn.   carboxymethylcellulose 0.5 % Soln Commonly known as: REFRESH  PLUS Place 1 drop into both eyes 3 (three) times daily as needed (Dry Eyes).   carvedilol 6.25 MG tablet Commonly known as: COREG Take 1 tablet (6.25 mg total) by mouth 2 (two) times daily.   CENTRUM SILVER PO Take 1 tablet by mouth daily.   clopidogrel 75 MG tablet Commonly known as: PLAVIX Take 1 tablet (75 mg total) by mouth daily. Start taking on: March 02, 2023   colestipol 1 g tablet Commonly known as: COLESTID TAKE 1 TABLET BY MOUTH 2 TIMES DAILY.   famotidine 20 MG tablet Commonly known as: PEPCID TAKE 1 TABLET BY MOUTH TWICE A DAY   ferrous sulfate 324 MG Tbec Take 324 mg by mouth daily with breakfast.   fluocinonide 0.05 % external solution Commonly known as: LIDEX APPLY TO AFFECTED AREAS OF SCALP DAILY UNTIL CLEAR, THEN FOR FLARES   lisinopril 10 MG tablet Commonly known as: ZESTRIL Take 1 tablet (10 mg total) by mouth daily.   omeprazole 40 MG capsule Commonly known as:  PRILOSEC TAKE 1 CAPSULE BY MOUTH TWICE A DAY   oxybutynin 10 MG 24 hr tablet Commonly known as: DITROPAN-XL TAKE 1 TABLET BY MOUTH EVERY DAY   potassium chloride 10 MEQ tablet Commonly known as: KLOR-CON TAKE 1 TABLET BY MOUTH EVERY DAY   rosuvastatin 20 MG tablet Commonly known as: CRESTOR Take 1 tablet (20 mg total) by mouth daily. What changed:  medication strength how much to take   tirzepatide 2.5 MG/0.5ML Pen Commonly known as: MOUNJARO Inject 2.5 mg into the skin once a week. What changed: Another medication with the same name was removed. Continue taking this medication, and follow the directions you see here.   Trelegy Ellipta 100-62.5-25 MCG/ACT Aepb Generic drug: Fluticasone-Umeclidin-Vilant Inhale 1 puff into the lungs daily.   Venlafaxine HCl 225 MG Tb24 TAKE 1 TABLET (225 MG TOTAL) BY MOUTH DAILY. PLEASE SCHEDULE OFFICE VISIT BEFORE ANY FUTURE REFILLS   VITAMIN D-3 PO Take 1 tablet by mouth daily.       Allergies  Allergen Reactions   Codeine Other  (See Comments)    Chest pain   Quinolones     PATIENT UNAWARE OF allergy to this class of antibiotics, only aware of allergy to [facility] Patient was warned about not using Cipro and similar antibiotics. Recent studies have raised concern that fluoroquinolone antibiotics could be associated with an increased risk of aortic aneurysm Fluoroquinolones have non-antimicrobial properties that might jeopardise the integrity of the extracellular matrix of the vascular wall In a  propensity score matched cohort study in [place], there was a 66% increased rate   Aleve [Naproxen Sodium] Hives, Rash and Other (See Comments)    headaches   Lorazepam Other (See Comments)    "Feels loopy"   Oxycodone Other (See Comments)    MENTAL CHANGES   Paregoric Other (See Comments)    Chest pain---tolerated MORPHINE   Penicillins Hives    Can take Augmentin    Follow-up Information     [facility], [facility] Follow up.   Specialty: Home Health Services Why: HHPT/OT/RN/SLP/aide/SW- arranged- they will contact you to schedule post discharge Contact information: [address] [place] [address] [phone]         Erasmo Downer, MD. Schedule an appointment as soon as possible for a visit in 2 week(s).   Specialty: Family Medicine Contact information: [address] [place] [address] [phone]         Micki Riley, MD. Schedule an appointment as soon as possible for a visit in 1 month(s).   Specialties: Neurology, Radiology Contact information: [address] [place] [address] [phone]         Julieanne Cotton, MD. Schedule an appointment as soon as possible for a visit in 2 week(s).   Specialties: Interventional Radiology, Radiology Why: office will call with appointment time Contact information: [address] [place] [address] [phone]                  The results of significant diagnostics from  this hospitalization (including imaging, microbiology, ancillary and laboratory) are listed below for reference.    Significant Diagnostic Studies: IR ANGIO INTRA EXTRACRAN SEL COM CAROTID INNOMINATE BILAT MOD SED Result Date: 03/01/2023 CLINICAL DATA:  Right-sided hemiparesis and dysarthria and aphasia. 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 EXAM: BILATERAL COMMON CAROTID AND INNOMINATE ANGIOGRAPHY COMPARISON:  CT angiogram of the head  and neck February 25, 2023. MEDICATIONS: Heparin 2000 units IV; no antibiotic was administered within 1 hour of the procedure. ANESTHESIA/SEDATION: Versed 1 mg IV; Fentanyl 25 mcg IV Moderate Sedation Time:  30 minutes The patient was continuously monitored during the procedure by the interventional radiology nurse under my direct supervision. CONTRAST:  Omnipaque 300 approximately 85 mL. FLUOROSCOPY TIME:  Fluoroscopy Time: 12 minutes 36 seconds (715 mGy). COMPLICATIONS: None immediate. TECHNIQUE: Informed written consent was obtained from the patient after a thorough discussion of the procedural risks, benefits and alternatives. All questions were addressed. Maximal Sterile Barrier Technique was utilized including caps, mask, sterile gowns, sterile gloves, sterile drape, hand hygiene and skin antiseptic. A timeout was performed prior to the initiation of the procedure. The right forearm to the wrist was prepped and draped in the usual sterile manner. The right radial artery was identified with ultrasound, and its morphology documented in the radiology PACS system. A dorsal palmar anastomosis was verified to be present. Using ultrasound guidance access into the right radial artery was obtained with a 4/5 French radial sheath over an 018 inch micro guidewire. The micro guidewire, and the obturator were removed. Good aspiration was obtained from the side port of the radial sheath. A cocktail of 2000 units of  heparin, 200 mcg of nitroglycerin, and 2.5 mcg of verapamil was infused in diluted form without event. A right radial arteriogram was then performed. Over an 035 inch Roadrunner guidewire, a Simmons 2 diagnostic catheter was then advanced to the aortic arch region, and selectively positioned in the origin of the right vertebral artery. Further cannulations of common carotid arteries was not possible due to the patient's anatomy. The left groin was then prepped and draped in the usual sterile fashion. Access was obtained into left femoral artery using the Seldinger technique. Over an 035 inch guidewire, a 5 French Pinnacle sheath was inserted. Through this, and also over an 035 inch Roadrunner guidewire, a 5 [place] JB 1 catheter was advanced to the aortic arch region, and selectively positioned in the in the right common carotid artery and the left common carotid artery. Arteriograms were then performed extra cranially and intracranially. FINDINGS: The right vertebral artery origin is widely patent. The vessel opacifies to the cranial skull base. Patency is seen of the left vertebrobasilar junction and the left posterior-inferior cerebellar artery. The opacified portions of the basilar artery on the lateral projection demonstrate patency as do the superior cerebellar arteries and the anterior-inferior cerebellar arteries and the posterior cerebral arteries. The right common carotid arteriogram demonstrates the right external carotid artery and its major branches to be widely patent. The right internal carotid artery at the bulb to the cranial skull base is widely patent. The petrous, the cavernous and the supraclinoid right ICA demonstrate patency. The right middle cerebral artery opacifies into the capillary and venous phases. No angiographic evidence of a right anterior cerebral artery A1 segment was seen. The left common carotid arteriogram demonstrates the left external carotid artery and its major branches to be  widely patent. Left internal carotid artery at the bulb demonstrates a pre occlusive stenosis secondary to a circumferential atherosclerotic plaque. Distal to this there is moderate tortuosity of the left internal carotid artery. Distal to this the vessel is seen to opacify to the cranial skull base. The petrous, the cavernous and the supraclinoid left ICA are widely patent. Left posterior communicating artery is seen opacifying the left posterior-inferior cerebellar artery. The left middle cerebral artery and the left anterior  cerebral artery opacify into the capillary and venous phases. Cross-filling via the anterior communicating artery of the right anterior cerebral artery A2 segment and distally is seen. 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 PLAN: Findings reviewed with patient, the son and the patient's neurologist. Plan endovascular revascularization of the proximal left internal carotid artery electively following completion of the rehab program. Electronically Signed   By: Julieanne Cotton M.D.   On: 03/01/2023 08:11   IR US Guide Vasc Access Right Result Date: 03/01/2023 CLINICAL DATA:  Right-sided hemiparesis and dysarthria and aphasia. 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 EXAM: BILATERAL COMMON CAROTID AND INNOMINATE ANGIOGRAPHY COMPARISON:  CT angiogram of the head and neck February 25, 2023. MEDICATIONS: Heparin 2000 units IV; no antibiotic was administered within 1 hour of the procedure. ANESTHESIA/SEDATION: Versed 1 mg IV; Fentanyl 25 mcg IV Moderate Sedation Time:  30 minutes The patient was continuously monitored during the procedure by the interventional radiology nurse under my direct supervision. CONTRAST:  Omnipaque 300 approximately 85 mL. FLUOROSCOPY TIME:  Fluoroscopy Time: 12 minutes 36 seconds (715 mGy). COMPLICATIONS: None immediate. TECHNIQUE: Informed  written consent was obtained from the patient after a thorough discussion of the procedural risks, benefits and alternatives. All questions were addressed. Maximal Sterile Barrier Technique was utilized including caps, mask, sterile gowns, sterile gloves, sterile drape, hand hygiene and skin antiseptic. A timeout was performed prior to the initiation of the procedure. The right forearm to the wrist was prepped and draped in the usual sterile manner. The right radial artery was identified with ultrasound, and its morphology documented in the radiology PACS system. A dorsal palmar anastomosis was verified to be present. Using ultrasound guidance access into the right radial artery was obtained with a 4/5 French radial sheath over an 018 inch micro guidewire. The micro guidewire, and the obturator were removed. Good aspiration was obtained from the side port of the radial sheath. A cocktail of 2000 units of heparin, 200 mcg of nitroglycerin, and 2.5 mcg of verapamil was infused in diluted form without event. A right radial arteriogram was then performed. Over an 035 inch Roadrunner guidewire, a Simmons 2 diagnostic catheter was then advanced to the aortic arch region, and selectively positioned in the origin of the right vertebral artery. Further cannulations of common carotid arteries was not possible due to the patient's anatomy. The left groin was then prepped and draped in the usual sterile fashion. Access was obtained into left femoral artery using the Seldinger technique. Over an 035 inch guidewire, a 5 French Pinnacle sheath was inserted. Through this, and also over an 035 inch Roadrunner guidewire, a 5 [place] JB 1 catheter was advanced to the aortic arch region, and selectively positioned in the in the right common carotid artery and the left common carotid artery. Arteriograms were then performed extra cranially and intracranially. FINDINGS: The right vertebral artery origin is widely patent. The vessel  opacifies to the cranial skull base. Patency is seen of the left vertebrobasilar junction and the left posterior-inferior cerebellar artery. The opacified portions of the basilar artery on the lateral projection demonstrate patency as do the superior cerebellar arteries and the anterior-inferior cerebellar arteries and the posterior cerebral arteries. The right common carotid arteriogram demonstrates the right external carotid artery and its major branches to be widely patent. The right internal carotid artery at the bulb to the cranial skull base  is widely patent. The petrous, the cavernous and the supraclinoid right ICA demonstrate patency. The right middle cerebral artery opacifies into the capillary and venous phases. No angiographic evidence of a right anterior cerebral artery A1 segment was seen. The left common carotid arteriogram demonstrates the left external carotid artery and its major branches to be widely patent. Left internal carotid artery at the bulb demonstrates a pre occlusive stenosis secondary to a circumferential atherosclerotic plaque. Distal to this there is moderate tortuosity of the left internal carotid artery. Distal to this the vessel is seen to opacify to the cranial skull base. The petrous, the cavernous and the supraclinoid left ICA are widely patent. Left posterior communicating artery is seen opacifying the left posterior-inferior cerebellar artery. The left middle cerebral artery and the left anterior cerebral artery opacify into the capillary and venous phases. Cross-filling via the anterior communicating artery of the right anterior cerebral artery A2 segment and distally is seen. 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 PLAN: Findings reviewed with patient, the son and the patient's neurologist. Plan endovascular revascularization of the proximal left internal carotid artery electively following completion of the rehab program.  Electronically Signed   By: Julieanne Cotton M.D.   On: 03/01/2023 08:11   DG Swallowing Func-Speech Pathology Result Date: 02/27/2023 Table formatting from the original result was not included. Modified Barium Swallow Study Patient Details Name: Natasha Vasquez MRN: [id number] Date of Birth: [birth year] Today's Date: 02/27/2023 HPI/PMH: HPI: Natasha Vasquez is a 83 y.o. female presented to the [facility] ED 2/10 for evaluation of right-sided weakness and balance problems. 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 Patient transferred to [facility] same day for potential arteriogram for 02/26/23. PHMx: HTN, T2DM, HLD, depression, OSA with CPAP, osteoarthritis, ascending thoracic aortic aneurysm, asthma and GERD, Barretts esophagus. BSE 2/10 at [facility] revealed s/s pharyngeal dysphagia and recommended Dys 3, nectar thick liquids with right head turn until MBS can be performed. Clinical Impression: Clinical Impression: Patient presents with a pharyngeal phase dysphagia as per this MBS. Swallow was initated at level of pyriform sinus with liquids and vallecular sinus with puree solids. Nectar thick liquids via cup sips with head in neutral position resulted in shallow penetration (PAS) but no aspiration. With thin liquids, she had consistent, deep penetration to vocal cords (PAS 4) with cup sips. This led to one incident of silent aspiration (PAS 8) of cup sip of thin liquids. Chin tuck posture with cup and straw sips of thin liquids resulted in reduction of depth of penetration and prevented aspiration. 13mm barium tablet taken with thin liquids with chin tuck posture, resulted in tablet becoming lodged in vallecular sinus. Puree solids helped to transit tablet fully. No significant amount of residuals observed s/p initial swallows with any of the tested consistencies. 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 SLP recommending regular solids, thin liquids with chin tuck. Recommend taking medications whole with puree/pudding texture. Recommendations/Plan: Swallowing Evaluation Recommendations Swallowing Evaluation Recommendations Recommendations: PO diet PO Diet Recommendation: Regular; Thin liquids (Level 0) Liquid Administration via: Cup; Straw Medication Administration: Whole meds with puree Supervision: Patient able to self-feed Swallowing strategies  : Slow rate; Small bites/sips; Chin tuck Postural changes: Position pt fully upright for meals Oral care recommendations: Oral care BID (2x/day) Treatment Plan Treatment Plan Treatment recommendations: Therapy as outlined in treatment plan below  Follow-up recommendations: No SLP follow up Functional status assessment: Patient has had a recent decline in their functional status and demonstrates the ability to make significant improvements in function in a reasonable and predictable amount of time. Treatment frequency: Min 1x/week Treatment duration: 1 week Interventions: Aspiration precaution training; Diet toleration management by SLP; Patient/family education Recommendations Recommendations for follow up therapy are one component of a multi-disciplinary discharge planning process, led by the attending physician.  Recommendations may be updated based on patient status, additional functional criteria and insurance authorization. Assessment: Orofacial Exam: Orofacial Exam Oral Cavity: Oral Hygiene: WFL Oral Cavity - Dentition: Adequate natural dentition Orofacial Anatomy: WFL Anatomy: Anatomy: Prominent cricopharyngeus Boluses Administered: Boluses Administered Boluses Administered: Thin liquids (Level 0); Mildly thick liquids (Level 2, nectar thick); Puree  Oral Impairment Domain: Oral Impairment Domain Lip Closure: No labial escape Tongue control during bolus hold: Cohesive bolus between tongue to palatal seal Bolus  preparation/mastication: Timely and efficient chewing and mashing Bolus transport/lingual motion: Brisk tongue motion Oral residue: Complete oral clearance Location of oral residue : N/A Initiation of pharyngeal swallow : Pyriform sinuses; Valleculae  Pharyngeal Impairment Domain: Pharyngeal Impairment Domain Soft palate elevation: No bolus between soft palate (SP)/pharyngeal wall (PW) Laryngeal elevation: Partial superior movement of thyroid cartilage/partial approximation of arytenoids to epiglottic petiole Anterior hyoid excursion: Partial anterior movement Epiglottic movement: Complete inversion Laryngeal vestibule closure: Complete, no air/contrast in laryngeal vestibule Pharyngeal stripping wave : Present - complete Pharyngeal contraction (A/P view only): N/A Pharyngoesophageal segment opening: Complete distension and complete duration, no obstruction of flow Tongue base retraction: No contrast between tongue base and posterior pharyngeal wall (PPW) Pharyngeal residue: Complete pharyngeal clearance Location of pharyngeal residue: N/A  Esophageal Impairment Domain: Esophageal Impairment Domain Esophageal clearance upright position: Complete clearance, esophageal coating Pill: Pill Consistency administered: Thin liquids (Level 0); Puree Thin liquids (Level 0): Impaired (see clinical impressions) Puree: WFL Penetration/Aspiration Scale Score: Penetration/Aspiration Scale Score 1.  Material does not enter airway: Puree 2.  Material enters airway, remains ABOVE vocal cords then ejected out: Mildly thick liquids (Level 2, nectar thick); Thin liquids (Level 0) 4.  Material enters airway, CONTACTS cords then ejected out: Thin liquids (Level 0) 8.  Material enters airway, passes BELOW cords without attempt by patient to eject out (silent aspiration) : Thin liquids (Level 0) Compensatory Strategies: Compensatory Strategies Compensatory strategies: Yes Chin tuck: Effective Effective Chin Tuck: Thin liquid (Level 0)    General Information: No data recorded Diet Prior to this Study: Dysphagia 3 (mechanical soft); Mildly thick liquids (Level 2, nectar thick)   Temperature : Normal   Respiratory Status: WFL   Supplemental O2: None (Room air)   History of Recent Intubation: No  Behavior/Cognition: Alert; Cooperative; Pleasant mood Self-Feeding Abilities: Able to self-feed Baseline vocal quality/speech: Normal Volitional Cough: Able to elicit Volitional Swallow: Able to elicit Exam Limitations: No limitations Goal Planning: Prognosis for improved oropharyngeal function: Good No data recorded Barriers/Prognosis Comment: GERD/Barrett's esophagus; bell's palsy baseline No data recorded Consulted and agree with results and recommendations: Patient Pain: Pain Assessment Pain Assessment: No/denies pain Pain Score: 0 Faces Pain Scale: 2 Pain Location: R hip Pain Descriptors / Indicators: Spasm; Discomfort Pain Intervention(s): Monitored during session; Repositioned End of Session: Start Time:SLP Start Time (ACUTE ONLY): 1430 Stop Time: SLP Stop Time (ACUTE ONLY): 1450 Time Calculation:SLP Time Calculation (min) (ACUTE ONLY): 20 min Charges: SLP Evaluations $ SLP Speech Visit: 1 Visit SLP Evaluations $BSS Swallow: 1 Procedure $MBS Swallow: 1 Procedure $ SLP EVAL LANGUAGE/SOUND  PRODUCTION: 1 Procedure SLP visit diagnosis: SLP Visit Diagnosis: Dysphagia, pharyngeal phase (R13.13) Past Medical History: Past Medical History: Diagnosis Date  Anemia in pregnancy   Arthritis   hands  Barrett esophagus   Bell palsy 04/24/2004  Breast neoplasm, Tis (DCIS), 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  ER/PR NEGATIVE  Cataract   Chronic cough   Complication of anesthesia   20 years ago pts b/p dropped really low, "coded"  Depression   Depression   Diabetes mellitus without complication (HCC)   diet controlled  Family history of adverse reaction to anesthesia   son PONV  GERD (gastroesophageal reflux disease)   Gout   Hypercholesteremia   Hyperlipidemia    Hypertension   Melanoma (HCC)   MVP (mitral valve prolapse)   followed by PCP  Nephritis   Nephritis   S/P appendectomy   Thoracic aortic aneurysm (HCC)   Torn meniscus   left Past Surgical History: Past Surgical History: Procedure Laterality Date  ABDOMINAL HYSTERECTOMY    APPENDECTOMY    BREAST BIOPSY Right 04/25/2017  retroareolar 10:00   wing clip   path pending  BREAST BIOPSY Right 04/25/2017  10:00  5CMFN  venus clip    path pending  BREAST BIOPSY Right 05/03/2017  Affirm Bx- path pending  BREAST CYST ASPIRATION Bilateral   NEG  BREAST EXCISIONAL BIOPSY Left 20+ yrs ago  NEG  BREAST RECONSTRUCTION WITH PLACEMENT OF TISSUE EXPANDER AND FLEX HD (ACELLULAR HYDRATED DERMIS) Right 06/03/2017  Procedure: BREAST RECONSTRUCTION WITH PLACEMENT OF TISSUE EXPANDER AND FLEX HD (ACELLULAR HYDRATED DERMIS);  Surgeon: Peggye Form, DO;  Location: [facility];  Service: Plastics;  Laterality: Right;  BREAST REDUCTION SURGERY Left 11/06/2017  Procedure: BREAST REDUCTION;  Surgeon: Peggye Form, DO;  Location: [facility];  Service: Plastics;  Laterality: Left;  BROW LIFT Bilateral 02/01/2015  Procedure: BLEPHAROPLASTY bilateral upper eyelids.;  Surgeon: Imagene Riches, MD;  Location: [facility];  Service: Ophthalmology;  Laterality: Bilateral;  DIABETIC - diet controlled  CATARACT EXTRACTION W/PHACO Left 01/14/2019  Procedure: CATARACT EXTRACTION PHACO AND INTRAOCULAR LENS PLACEMENT (IOC) LEFT TECNIS TORIC ADD 5.62 00:46.5 30.0%;  Surgeon: Lockie Mola, MD;  Location: [facility];  Service: Ophthalmology;  Laterality: Left;  diabetic - diet controlled  CATARACT EXTRACTION W/PHACO Right 02/11/2019  Procedure: CATARACT EXTRACTION PHACO AND INTRAOCULAR LENS PLACEMENT (IOC) RIGHT DIABETIC 4.82 00:46.9 10.3%;  Surgeon: Lockie Mola, MD;  Location: [facility];  Service: Ophthalmology;  Laterality: Right;  Diabetic - diet controlled  CHOLECYSTECTOMY    COLONOSCOPY    COLONOSCOPY WITH  PROPOFOL N/A 05/11/2015  Procedure: COLONOSCOPY WITH PROPOFOL;  Surgeon: Scot Jun, MD;  Location: [facility];  Service: Endoscopy;  Laterality: N/A;  COLONOSCOPY WITH PROPOFOL N/A 09/07/2020  Procedure: COLONOSCOPY WITH PROPOFOL;  Surgeon: Toledo, Boykin Nearing, MD;  Location: [facility] ENDOSCOPY;  Service: Gastroenterology;  Laterality: N/A;  ESOPHAGOGASTRODUODENOSCOPY    ESOPHAGOGASTRODUODENOSCOPY (EGD) WITH PROPOFOL  05/11/2015  Procedure: ESOPHAGOGASTRODUODENOSCOPY (EGD) WITH PROPOFOL;  Surgeon: Scot Jun, MD;  Location: [facility] ENDOSCOPY;  Service: Endoscopy;;  FRACTURE SURGERY Right   arm and shoulder  JOINT REPLACEMENT    KNEE ARTHROSCOPY Left 03/14/2016  Procedure: ARTHROSCOPY KNEE, PARTIAL MEDIAL MENISECTOMY, CHONDROPLASTY;  Surgeon: Donato Heinz, MD;  Location: [facility];  Service: Orthopedics;  Laterality: Left;  MASTECTOMY Right 2019  MASTECTOMY W/ SENTINEL NODE BIOPSY Right 06/03/2017  Procedure: MASTECTOMY WITH SENTINEL LYMPH NODE BIOPSY;  Surgeon: Earline Mayotte, MD;  Location: [facility];  Service: General;  Laterality: Right;  PARTIAL HIP ARTHROPLASTY Right   REDUCTION MAMMAPLASTY Left 2019  REMOVAL OF BILATERAL TISSUE EXPANDERS WITH PLACEMENT OF BILATERAL BREAST IMPLANTS Right 11/06/2017  Procedure: REMOVAL OF TISSUE EXPANDER WITH PLACEMENT OF BREAST IMPLANT;  Surgeon: Peggye Form, DO;  Location: [facility];  Service: Plastics;  Laterality: Right;  TONSILLECTOMY   Angela Nevin, MA, CCC-SLP Speech Therapy   ECHOCARDIOGRAM COMPLETE Result Date: 02/26/2023    ECHOCARDIOGRAM REPORT   Patient Name:   PRISMA DECARLO Date of Exam: 02/26/2023 Medical Rec #:  [id number]         Height:       64.0 in Accession #:    [id number]        Weight:       196.0 lb Date of Birth:  [birth year]          BSA:          1.940 m Patient Age:    82 years          BP:           174/94 mmHg Patient Gender: F                 HR:           72 bpm. Exam Location:  Inpatient Procedure: 2D Echo, Cardiac Doppler,  Color Doppler, Saline Contrast Bubble Study            and Intracardiac Opacification Agent Indications:    Stroke  History:        Patient has prior history of Echocardiogram examinations, most                 recent 09/05/2022. Risk Factors:Hypertension, Diabetes,                 Dyslipidemia and Former Smoker.  Sonographer:    Karma Ganja Referring Phys: [id number] PROSPER M AMPONSAH  Sonographer Comments: Technically difficult study due to poor echo windows and patient is obese. IMPRESSIONS  1. Left ventricular ejection fraction, by estimation, is 55 to 60%. The left ventricle has normal function. The left ventricle has no regional wall motion abnormalities. There is mild asymmetric left ventricular hypertrophy of the septal segment. Left ventricular diastolic parameters are consistent with Grade I diastolic dysfunction (impaired relaxation).  2. Right ventricular systolic function is normal. The right ventricular size is normal. There is normal pulmonary artery systolic pressure. The estimated right ventricular systolic pressure is 30.9 mmHg.  3. The mitral valve is grossly normal. No evidence of mitral valve regurgitation. No evidence of mitral stenosis.  4. The aortic valve is tricuspid. There is mild calcification of the aortic valve. There is mild thickening of the aortic valve. Aortic valve regurgitation is not visualized. Aortic valve sclerosis is present, with no evidence of aortic valve stenosis.  5. The inferior vena cava is normal in size with greater than 50% respiratory variability, suggesting right atrial pressure of 3 mmHg.  6. Agitated saline contrast bubble study was negative, with no evidence of any interatrial shunt. Comparison(s): No significant change from prior study. Conclusion(s)/Recommendation(s): No intracardiac source of embolism detected on this transthoracic study. Consider a transesophageal echocardiogram to exclude cardiac source of embolism if clinically indicated. FINDINGS  Left  Ventricle: Left ventricular ejection fraction, by estimation, is 55 to 60%. The left ventricle has normal function. The left ventricle has no regional wall motion abnormalities. Definity contrast agent was given IV to delineate the left ventricular  endocardial borders. The  left ventricular internal cavity size was normal in size. There is mild asymmetric left ventricular hypertrophy of the septal segment. Left ventricular diastolic parameters are consistent with Grade I diastolic dysfunction (impaired relaxation). Right Ventricle: The right ventricular size is normal. No increase in right ventricular wall thickness. Right ventricular systolic function is normal. There is normal pulmonary artery systolic pressure. The tricuspid regurgitant velocity is 2.64 m/s, and  with an assumed right atrial pressure of 3 mmHg, the estimated right ventricular systolic pressure is 30.9 mmHg. Left Atrium: Left atrial size was normal in size. Right Atrium: Right atrial size was normal in size. Pericardium: There is no evidence of pericardial effusion. Presence of epicardial fat layer. Mitral Valve: The mitral valve is grossly normal. No evidence of mitral valve regurgitation. No evidence of mitral valve stenosis. Tricuspid Valve: The tricuspid valve is grossly normal. Tricuspid valve regurgitation is trivial. No evidence of tricuspid stenosis. Aortic Valve: The aortic valve is tricuspid. There is mild calcification of the aortic valve. There is mild thickening of the aortic valve. Aortic valve regurgitation is not visualized. Aortic valve sclerosis is present, with no evidence of aortic valve stenosis. Aortic valve mean gradient measures 6.0 mmHg. Aortic valve peak gradient measures 10.9 mmHg. Aortic valve area, by VTI measures 2.64 cm. Pulmonic Valve: The pulmonic valve was grossly normal. Pulmonic valve regurgitation is not visualized. No evidence of pulmonic stenosis. Aorta: The aortic root is normal in size and structure.  Venous: The inferior vena cava is normal in size with greater than 50% respiratory variability, suggesting right atrial pressure of 3 mmHg. IAS/Shunts: The atrial septum is grossly normal. Agitated saline contrast was given intravenously to evaluate for intracardiac shunting. Agitated saline contrast bubble study was negative, with no evidence of any interatrial shunt.  LEFT VENTRICLE PLAX 2D LVIDd:         4.10 cm     Diastology LVIDs:         2.70 cm     LV e' medial:    5.77 cm/s LV PW:         0.90 cm     LV E/e' medial:  12.9 LV IVS:        1.20 cm     LV e' lateral:   9.68 cm/s LVOT diam:     2.10 cm     LV E/e' lateral: 7.7 LV SV:         81 LV SV Index:   42 LVOT Area:     3.46 cm  LV Volumes (MOD) LV vol d, MOD A2C: 83.3 ml LV vol d, MOD A4C: 63.6 ml LV vol s, MOD A2C: 30.2 ml LV vol s, MOD A4C: 36.0 ml LV SV MOD A2C:     53.1 ml LV SV MOD A4C:     63.6 ml LV SV MOD BP:      38.7 ml RIGHT VENTRICLE             IVC RV Basal diam:  3.60 cm     IVC diam: 1.60 cm RV S prime:     14.30 cm/s TAPSE (M-mode): 2.6 cm LEFT ATRIUM             Index        RIGHT ATRIUM           Index LA diam:        3.90 cm 2.01 cm/m   RA Area:     17.60 cm LA Vol (A2C):  94.1 ml 48.51 ml/m  RA Volume:   46.90 ml  24.18 ml/m LA Vol (A4C):   43.1 ml 22.22 ml/m LA Biplane Vol: 62.9 ml 32.43 ml/m  AORTIC VALVE AV Area (Vmax):    2.62 cm AV Area (Vmean):   2.65 cm AV Area (VTI):     2.64 cm AV Vmax:           165.00 cm/s AV Vmean:          111.000 cm/s AV VTI:            0.306 m AV Peak Grad:      10.9 mmHg AV Mean Grad:      6.0 mmHg LVOT Vmax:         125.00 cm/s LVOT Vmean:        84.900 cm/s LVOT VTI:          0.233 m LVOT/AV VTI ratio: 0.76  AORTA Ao Root diam: 3.50 cm Ao Asc diam:  3.30 cm MITRAL VALVE               TRICUSPID VALVE MV Area (PHT): 2.93 cm    TR Peak grad:   27.9 mmHg MV Decel Time: 259 msec    TR Vmax:        264.00 cm/s MV E velocity: 74.40 cm/s MV A velocity: 89.60 cm/s  SHUNTS MV E/A ratio:  0.83         Systemic VTI:  0.23 m                            Systemic Diam: 2.10 cm Lennie Odor MD Electronically signed by Lennie Odor MD Signature Date/Time: 02/26/2023/10:56:16 AM    Final    DG HIP UNILAT WITH PELVIS 1V RIGHT Result Date: 02/26/2023 CLINICAL DATA:  83 year old female with history of right-sided hip pain. EXAM: DG HIP (WITH OR WITHOUT PELVIS) 1V RIGHT COMPARISON:  No priors. FINDINGS: AP view of the bony pelvis and AP view of the right hip demonstrate no definite acute displaced fracture of the bony pelvic ring. Bilateral proximal femurs as visualized appear intact. Patient is status post right hip hemiarthroplasty. Prosthetic femoral head projects within the acetabulum on this single view examination. No definite periprosthetic fracture or other acute abnormality. Iodinated contrast material is noted within the lumen of the urinary bladder, presumably related to recent contrast-enhanced CT examinations. IMPRESSION: 1. No acute radiographic abnormality of the bony pelvis or the right hip. 2. Status post right hip hemiarthroplasty. Electronically Signed   By: Trudie Reed M.D.   On: 02/26/2023 06:12   MR BRAIN WO CONTRAST Result Date: 02/25/2023 CLINICAL DATA:  Neuro deficit, acute, stroke suspected. Right-sided weakness. EXAM: MRI HEAD WITHOUT CONTRAST TECHNIQUE: Multiplanar, multiecho pulse sequences of the brain and surrounding structures were obtained without intravenous contrast. COMPARISON:  CT studies same day. FINDINGS: Brain: Diffusion imaging shows acute infarction within the left midbrain and left cerebral peduncle. No other acute infarction. 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 No focal cerebellar insult. Cerebral hemispheres otherwise show mild chronic small-vessel ischemic change of the white matter. No large vessel territory stroke. No mass lesion, hemorrhage, hydrocephalus or extra-axial collection. Vascular: Major vessels at the base of the brain show flow. Skull and  upper cervical spine: Negative Sinuses/Orbits: Clear/normal Other: None IMPRESSION: 1. 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 2. 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 3. Mild chronic  small-vessel ischemic change of the cerebral hemispheric white matter. Electronically Signed   By: Paulina Fusi M.D.   On: 02/25/2023 19:55   CT ANGIO HEAD NECK W WO CM Result Date: 02/25/2023 CLINICAL DATA:  Neuro deficit, acute, stroke suspected. Extremity weakness. Facial droop. Hypertension and diabetes. EXAM: CT ANGIOGRAPHY HEAD AND NECK WITH AND WITHOUT CONTRAST TECHNIQUE: Multidetector CT imaging of the head and neck was performed using the standard protocol during bolus administration of intravenous contrast. Multiplanar CT image reconstructions and MIPs were obtained to evaluate the vascular anatomy. Carotid stenosis measurements (when applicable) are obtained utilizing NASCET criteria, using the distal internal carotid diameter as the denominator. RADIATION DOSE REDUCTION: This exam was performed according to the departmental dose-optimization program which includes automated exposure control, adjustment of the mA and/or kV according to patient size and/or use of iterative reconstruction technique. CONTRAST:  60mL OMNIPAQUE IOHEXOL 350 MG/ML SOLN COMPARISON:  Head CT earlier same day.  Ultrasound 04/28/2021. FINDINGS: CTA NECK FINDINGS Aortic arch: Aortic atherosclerosis. Branching pattern is normal without origin stenosis. Right carotid system: Common carotid artery widely patent to the bifurcation. No soft or calcified plaque at the bifurcation. Right ICA does not show any focal stenosis but is smaller than the left. Left carotid system: Common carotid artery widely patent to the bifurcation. Calcified plaque at the carotid bifurcation and ICA bulb. Minimal diameter in the ICA bulb less than 1 mm. Near occlusion. Vessel regains a more normal caliber at the distal bulb and is  patent through the cervical region to the skull base beyond that. Vertebral arteries: Both vertebral artery origins widely patent. Both vertebral arteries patent through the cervical region to the foramen magnum. Skeleton: Chronic mid cervical spondylosis. Other neck: No mass or lymphadenopathy. Upper chest: Scarring at the lung apices.  No acute finding. Review of the MIP images confirms the above findings CTA HEAD FINDINGS Anterior circulation: Both internal carotid arteries are patent through the skull base and siphon regions. Ordinary siphon atherosclerotic calcification but no stenosis greater than 30%. The anterior and middle cerebral vessels are patent. Both anterior cerebral arteries receive there supply from the left carotid circulation. Right internal carotid artery supplies only the middle cerebral artery territory. No large vessel occlusion. No proximal stenosis. No aneurysm or vascular malformation. Posterior circulation: Both vertebral arteries patent through the foramen magnum to the basilar artery. No basilar stenosis. Posterior circulation branch vessels are patent. Left PCA receives most of it supply from the anterior circulation. Venous sinuses: Patent and normal. Anatomic variants: None significant otherwise. Review of the MIP images confirms the above findings IMPRESSION: 1. Aortic atherosclerosis. 2. Calcified plaque at the left carotid bifurcation and ICA bulb. Minimal diameter in the ICA bulb less than 1 mm. Near occlusion. Vessel regains a more normal caliber at the distal bulb and is patent through the cervical region to the skull base beyond that. This is of particular importance in this patient as the left internal carotid artery supplies the majority of the brain, as the right ICA essentially gives isolated supply to the right middle cerebral artery territory only. Urgent vascular surgery consultation suggested. 3. No intracranial large or medium vessel occlusion or correctable proximal  stenosis. Electronically Signed   By: Paulina Fusi M.D.   On: 02/25/2023 15:13   CT Head Wo Contrast Result Date: 02/25/2023 CLINICAL DATA:  Neuro deficit, acute, stroke suspected EXAM: CT HEAD WITHOUT CONTRAST TECHNIQUE: Contiguous axial images were obtained from the base of the skull through the vertex without  intravenous contrast. RADIATION DOSE REDUCTION: This exam was performed according to the departmental dose-optimization program which includes automated exposure control, adjustment of the mA and/or kV according to patient size and/or use of iterative reconstruction technique. COMPARISON:  None Available. FINDINGS: Brain: No hemorrhage. No hydrocephalus. No extra-axial fluid collection. No mass effect. No mass lesion. 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 Skull: Normal. Negative for fracture or focal lesion. Sinuses/Orbits: No middle ear or mastoid effusion. Paranasal sinuses are clear. Bilateral lens replacement. Orbits are otherwise unremarkable. Other: None. 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 Consider further evaluation with MRI. Electronically Signed   By: Lorenza Cambridge M.D.   On: 02/25/2023 11:53    Microbiology: No results found for this or any previous visit (from the past 240 hours).   Labs: Basic Metabolic Panel: Recent Labs  Lab 02/25/23 1113 02/26/23 0317 02/27/23 0334 02/28/23 0349 03/01/23 0348  NA 140 141 140 141 138  K 3.8 3.6 3.5 3.7 3.6  CL 105 104 101 106 104  CO2 25 24 24 24 24   GLUCOSE 135* 107* 131* 109* 125*  BUN 14 10 17 20 16   CREATININE 0.86 0.91 0.85 0.95 0.88  CALCIUM 9.3 9.6 9.4 9.1 8.9  MG 2.0  --   --   --   --    Liver Function Tests: Recent Labs  Lab 02/25/23 1113  AST 34  ALT 40  ALKPHOS 71  BILITOT 0.6  PROT 7.3  ALBUMIN 4.5   No results for input(s): "LIPASE", "AMYLASE" in the last 168 hours. No results for  input(s): "AMMONIA" in the last 168 hours. CBC: Recent Labs  Lab 02/25/23 1113 02/26/23 0317 02/27/23 0334  WBC 7.1 8.9 8.4  NEUTROABS 5.1  --   --   HGB 12.4 12.5 12.5  HCT 38.4 37.8 37.1  MCV 90.8 88.5 87.7  PLT 301 288 269   Cardiac Enzymes: No results for input(s): "CKTOTAL", "CKMB", "CKMBINDEX", "TROPONINI" in the last 168 hours. BNP: BNP (last 3 results) No results for input(s): "BNP" in the last 8760 hours.  ProBNP (last 3 results) No results for input(s): "PROBNP" in the last 8760 hours.  CBG: Recent Labs  Lab 02/28/23 1130 02/28/23 1641 02/28/23 2132 03/01/23 0631 03/01/23 1137  GLUCAP 175* 77 99 115* 112*       Signed:  Ramiro Harvest MD.  [facility] 03/01/2023, 3:43 PM

## 2023-03-04 ENCOUNTER — Telehealth: Payer: Self-pay

## 2023-03-04 ENCOUNTER — Encounter: Payer: Self-pay | Admitting: Thoracic Surgery (Cardiothoracic Vascular Surgery)

## 2023-03-04 ENCOUNTER — Telehealth: Payer: Self-pay | Admitting: Family Medicine

## 2023-03-04 DIAGNOSIS — R531 Weakness: Secondary | ICD-10-CM

## 2023-03-04 NOTE — Patient Outreach (Signed)
  Natasha Vasquez  03/04/2023 Name:  Natasha Vasquez MRN:  [id number] DOB:  1940/02/06  Subjective: Natasha Vasquez is a 83 y.o. year old female who is a primary care patient of Natasha Vasquez, Natasha Schlein, MD An Natasha alert was received indicating patient responded to questions: Went to follow-Vasquez appointment?. I reached out by phone to follow Vasquez on the alert and spoke to Patient.  Patient reports that she is doing well. Reports she continues to have right sided weakness.  Reports that she feels like her strength is getting better.  Reports that she thinks her speech is improved.    Patient reports that she does not have home health yet.  Reports no one has called. Reviewed contact phone number with patient and encouraged patient to call home health. Reviewed with patient IR radiology appointment.  All appointments reviewed including PCP followup. Ensured transportation.   Care Coordination Interventions:  Yes, provided  Interventions Today    Flowsheet Row Most Recent Value  Chronic Disease   Chronic disease during today's visit Other  [Acute CVA]  General Interventions   General Interventions Discussed/Reviewed General Interventions Discussed, Doctor Visits  Doctor Visits Discussed/Reviewed Doctor Visits Discussed, Doctor Visits Reviewed, PCP, Specialist  [Reviewed all pending followup appointments]  Exercise Interventions   Exercise Discussed/Reviewed Exercise Discussed, Exercise Reviewed  Education Interventions   Education Provided Provided Education  [Reviewed home health orders for  RN, ST, OT, PT SW, [facility] aid, Reviewed with patient when to call 911. Reviewed stroke patients]  Provided Verbal Education On When to see the doctor  Nutrition Interventions   Nutrition Discussed/Reviewed Nutrition Discussed  Pharmacy Interventions   Pharmacy Dicussed/Reviewed Medications and their functions      TOC Interventions Today    Flowsheet Row Most Recent Value  TOC  Interventions   TOC Interventions Discussed/Reviewed TOC Interventions Discussed, TOC Interventions Reviewed, S/S of infection        Follow Vasquez plan: No further intervention required.   Encounter Outcome:  Patient Visit Completed   Lonia Chimera, RN, BSN, CEN Population Health- Transition of Care Team.  [facility] [phone]

## 2023-03-04 NOTE — Telephone Encounter (Signed)
Natasha Vasquez calling from [facility] is calling to report a delay with start of care. Pt would like [facility] to start 02/2022. Please advise CB- [phone] Does not need a call back

## 2023-03-07 DIAGNOSIS — I152 Hypertension secondary to endocrine disorders: Secondary | ICD-10-CM | POA: Diagnosis not present

## 2023-03-07 DIAGNOSIS — J45909 Unspecified asthma, uncomplicated: Secondary | ICD-10-CM | POA: Diagnosis not present

## 2023-03-07 DIAGNOSIS — G4733 Obstructive sleep apnea (adult) (pediatric): Secondary | ICD-10-CM | POA: Diagnosis not present

## 2023-03-07 DIAGNOSIS — E1142 Type 2 diabetes mellitus with diabetic polyneuropathy: Secondary | ICD-10-CM | POA: Diagnosis not present

## 2023-03-07 DIAGNOSIS — I519 Heart disease, unspecified: Secondary | ICD-10-CM | POA: Diagnosis not present

## 2023-03-07 DIAGNOSIS — E1159 Type 2 diabetes mellitus with other circulatory complications: Secondary | ICD-10-CM | POA: Diagnosis not present

## 2023-03-07 DIAGNOSIS — I69351 Hemiplegia and hemiparesis following cerebral infarction affecting right dominant side: Secondary | ICD-10-CM | POA: Diagnosis not present

## 2023-03-07 DIAGNOSIS — F32A Depression, unspecified: Secondary | ICD-10-CM | POA: Diagnosis not present

## 2023-03-07 DIAGNOSIS — I69391 Dysphagia following cerebral infarction: Secondary | ICD-10-CM | POA: Diagnosis not present

## 2023-03-07 NOTE — Telephone Encounter (Signed)
 Noted

## 2023-03-08 DIAGNOSIS — G4733 Obstructive sleep apnea (adult) (pediatric): Secondary | ICD-10-CM | POA: Diagnosis not present

## 2023-03-08 DIAGNOSIS — J45909 Unspecified asthma, uncomplicated: Secondary | ICD-10-CM | POA: Diagnosis not present

## 2023-03-08 DIAGNOSIS — F32A Depression, unspecified: Secondary | ICD-10-CM | POA: Diagnosis not present

## 2023-03-08 DIAGNOSIS — E1159 Type 2 diabetes mellitus with other circulatory complications: Secondary | ICD-10-CM | POA: Diagnosis not present

## 2023-03-08 DIAGNOSIS — I69391 Dysphagia following cerebral infarction: Secondary | ICD-10-CM | POA: Diagnosis not present

## 2023-03-08 DIAGNOSIS — I152 Hypertension secondary to endocrine disorders: Secondary | ICD-10-CM | POA: Diagnosis not present

## 2023-03-08 DIAGNOSIS — E1142 Type 2 diabetes mellitus with diabetic polyneuropathy: Secondary | ICD-10-CM | POA: Diagnosis not present

## 2023-03-08 DIAGNOSIS — I519 Heart disease, unspecified: Secondary | ICD-10-CM | POA: Diagnosis not present

## 2023-03-08 DIAGNOSIS — I69351 Hemiplegia and hemiparesis following cerebral infarction affecting right dominant side: Secondary | ICD-10-CM | POA: Diagnosis not present

## 2023-03-11 ENCOUNTER — Telehealth: Payer: Self-pay

## 2023-03-11 DIAGNOSIS — J45909 Unspecified asthma, uncomplicated: Secondary | ICD-10-CM | POA: Diagnosis not present

## 2023-03-11 DIAGNOSIS — I69391 Dysphagia following cerebral infarction: Secondary | ICD-10-CM | POA: Diagnosis not present

## 2023-03-11 DIAGNOSIS — G4733 Obstructive sleep apnea (adult) (pediatric): Secondary | ICD-10-CM | POA: Diagnosis not present

## 2023-03-11 DIAGNOSIS — E1142 Type 2 diabetes mellitus with diabetic polyneuropathy: Secondary | ICD-10-CM | POA: Diagnosis not present

## 2023-03-11 DIAGNOSIS — I152 Hypertension secondary to endocrine disorders: Secondary | ICD-10-CM | POA: Diagnosis not present

## 2023-03-11 DIAGNOSIS — E1159 Type 2 diabetes mellitus with other circulatory complications: Secondary | ICD-10-CM | POA: Diagnosis not present

## 2023-03-11 DIAGNOSIS — I519 Heart disease, unspecified: Secondary | ICD-10-CM | POA: Diagnosis not present

## 2023-03-11 DIAGNOSIS — F32A Depression, unspecified: Secondary | ICD-10-CM | POA: Diagnosis not present

## 2023-03-11 DIAGNOSIS — I69351 Hemiplegia and hemiparesis following cerebral infarction affecting right dominant side: Secondary | ICD-10-CM | POA: Diagnosis not present

## 2023-03-11 NOTE — Telephone Encounter (Unsigned)
 Copied from CRM [id number]. Topic: Clinical - Prescription Issue >> Mar 11, 2023 10:12 AM Gery Pray wrote: Reason for CRM: Lori B. From [facility] calling to report Level 2 medication. albuterol (VENTOLIN HFA) 108 (90 Base) MCG/ACT inhaler, bismuth subsalicylate (PEPTO BISMOL) 262 MG chewable tablet, carvedilol (COREG) 6.25 MG tablet, clopidogrel (PLAVIX) 75 MG tablet, xybutynin (DITROPAN-XL) 10 MG 24 hr tablet. System flagged them nothing wrong with medication. She is required to report them per her job.

## 2023-03-11 NOTE — Telephone Encounter (Unsigned)
 Copied from CRM [id number]. Topic: Clinical - Home Health Verbal Orders >> Mar 11, 2023  3:47 PM Martha Clan wrote: Caller/Agency: [facility] Callback Number: [phone] Service Requested: Physical Therapy Frequency: 2 times a week for 3 weeks, then 1 time a week for 5 weeks Any new concerns about the patient? No

## 2023-03-11 NOTE — Telephone Encounter (Signed)
 Noted. Raven, any concerns with significant interactions with her medications from your perspective?

## 2023-03-12 DIAGNOSIS — I519 Heart disease, unspecified: Secondary | ICD-10-CM | POA: Diagnosis not present

## 2023-03-12 DIAGNOSIS — I69391 Dysphagia following cerebral infarction: Secondary | ICD-10-CM | POA: Diagnosis not present

## 2023-03-12 DIAGNOSIS — J45909 Unspecified asthma, uncomplicated: Secondary | ICD-10-CM | POA: Diagnosis not present

## 2023-03-12 DIAGNOSIS — E1142 Type 2 diabetes mellitus with diabetic polyneuropathy: Secondary | ICD-10-CM | POA: Diagnosis not present

## 2023-03-12 DIAGNOSIS — G4733 Obstructive sleep apnea (adult) (pediatric): Secondary | ICD-10-CM | POA: Diagnosis not present

## 2023-03-12 DIAGNOSIS — F32A Depression, unspecified: Secondary | ICD-10-CM | POA: Diagnosis not present

## 2023-03-12 DIAGNOSIS — E1159 Type 2 diabetes mellitus with other circulatory complications: Secondary | ICD-10-CM | POA: Diagnosis not present

## 2023-03-12 DIAGNOSIS — I152 Hypertension secondary to endocrine disorders: Secondary | ICD-10-CM | POA: Diagnosis not present

## 2023-03-12 DIAGNOSIS — I69351 Hemiplegia and hemiparesis following cerebral infarction affecting right dominant side: Secondary | ICD-10-CM | POA: Diagnosis not present

## 2023-03-12 MED ORDER — PANTOPRAZOLE SODIUM 40 MG PO TBEC: 40.0000 mg | DELAYED_RELEASE_TABLET | Freq: Two times a day (BID) | ORAL | 1 refills | Status: DC

## 2023-03-12 NOTE — Telephone Encounter (Signed)
 OK for verbals

## 2023-03-12 NOTE — Telephone Encounter (Signed)
Called and gave verbal orders 

## 2023-03-13 ENCOUNTER — Telehealth: Payer: Self-pay | Admitting: Family Medicine

## 2023-03-13 DIAGNOSIS — I69351 Hemiplegia and hemiparesis following cerebral infarction affecting right dominant side: Secondary | ICD-10-CM | POA: Diagnosis not present

## 2023-03-13 DIAGNOSIS — E1159 Type 2 diabetes mellitus with other circulatory complications: Secondary | ICD-10-CM | POA: Diagnosis not present

## 2023-03-13 DIAGNOSIS — M7061 Trochanteric bursitis, right hip: Secondary | ICD-10-CM | POA: Diagnosis not present

## 2023-03-13 DIAGNOSIS — F32A Depression, unspecified: Secondary | ICD-10-CM | POA: Diagnosis not present

## 2023-03-13 DIAGNOSIS — I152 Hypertension secondary to endocrine disorders: Secondary | ICD-10-CM | POA: Diagnosis not present

## 2023-03-13 DIAGNOSIS — G4733 Obstructive sleep apnea (adult) (pediatric): Secondary | ICD-10-CM | POA: Diagnosis not present

## 2023-03-13 DIAGNOSIS — J45909 Unspecified asthma, uncomplicated: Secondary | ICD-10-CM | POA: Diagnosis not present

## 2023-03-13 DIAGNOSIS — E1142 Type 2 diabetes mellitus with diabetic polyneuropathy: Secondary | ICD-10-CM | POA: Diagnosis not present

## 2023-03-13 DIAGNOSIS — Z96641 Presence of right artificial hip joint: Secondary | ICD-10-CM | POA: Diagnosis not present

## 2023-03-13 DIAGNOSIS — I69391 Dysphagia following cerebral infarction: Secondary | ICD-10-CM | POA: Diagnosis not present

## 2023-03-13 DIAGNOSIS — I519 Heart disease, unspecified: Secondary | ICD-10-CM | POA: Diagnosis not present

## 2023-03-13 NOTE — Telephone Encounter (Signed)
 Certification Forms were received on 03/13/2023 and placed in provider's mailbox for completion.

## 2023-03-14 ENCOUNTER — Ambulatory Visit (HOSPITAL_COMMUNITY)
Admission: RE | Admit: 2023-03-14 | Discharge: 2023-03-14 | Disposition: A | Discharge status: Home / Self Care | Payer: Medicare PPO | Source: Ambulatory Visit | Attending: Student | Admitting: Student

## 2023-03-14 ENCOUNTER — Ambulatory Visit: Payer: Medicare PPO | Admitting: Pulmonary Disease

## 2023-03-14 DIAGNOSIS — I519 Heart disease, unspecified: Secondary | ICD-10-CM | POA: Diagnosis not present

## 2023-03-14 DIAGNOSIS — J45909 Unspecified asthma, uncomplicated: Secondary | ICD-10-CM | POA: Diagnosis not present

## 2023-03-14 DIAGNOSIS — I69351 Hemiplegia and hemiparesis following cerebral infarction affecting right dominant side: Secondary | ICD-10-CM | POA: Diagnosis not present

## 2023-03-14 DIAGNOSIS — R531 Weakness: Secondary | ICD-10-CM | POA: Diagnosis not present

## 2023-03-14 DIAGNOSIS — M19041 Primary osteoarthritis, right hand: Secondary | ICD-10-CM

## 2023-03-14 DIAGNOSIS — E1159 Type 2 diabetes mellitus with other circulatory complications: Secondary | ICD-10-CM | POA: Diagnosis not present

## 2023-03-14 DIAGNOSIS — G4733 Obstructive sleep apnea (adult) (pediatric): Secondary | ICD-10-CM | POA: Diagnosis not present

## 2023-03-14 DIAGNOSIS — I152 Hypertension secondary to endocrine disorders: Secondary | ICD-10-CM | POA: Diagnosis not present

## 2023-03-14 DIAGNOSIS — I69391 Dysphagia following cerebral infarction: Secondary | ICD-10-CM | POA: Diagnosis not present

## 2023-03-14 DIAGNOSIS — M19042 Primary osteoarthritis, left hand: Secondary | ICD-10-CM

## 2023-03-14 DIAGNOSIS — F32A Depression, unspecified: Secondary | ICD-10-CM | POA: Diagnosis not present

## 2023-03-14 DIAGNOSIS — I700 Atherosclerosis of aorta: Secondary | ICD-10-CM

## 2023-03-14 DIAGNOSIS — E1142 Type 2 diabetes mellitus with diabetic polyneuropathy: Secondary | ICD-10-CM | POA: Diagnosis not present

## 2023-03-15 ENCOUNTER — Ambulatory Visit: Payer: Medicare PPO | Admitting: Family Medicine

## 2023-03-15 ENCOUNTER — Encounter: Payer: Self-pay | Admitting: Family Medicine

## 2023-03-15 VITALS — BP 130/78 | HR 64 | Ht 65.0 in | Wt 192.9 lb

## 2023-03-15 DIAGNOSIS — G4733 Obstructive sleep apnea (adult) (pediatric): Secondary | ICD-10-CM | POA: Diagnosis not present

## 2023-03-15 DIAGNOSIS — I152 Hypertension secondary to endocrine disorders: Secondary | ICD-10-CM

## 2023-03-15 DIAGNOSIS — I519 Heart disease, unspecified: Secondary | ICD-10-CM | POA: Diagnosis not present

## 2023-03-15 DIAGNOSIS — E1159 Type 2 diabetes mellitus with other circulatory complications: Secondary | ICD-10-CM | POA: Diagnosis not present

## 2023-03-15 DIAGNOSIS — K219 Gastro-esophageal reflux disease without esophagitis: Secondary | ICD-10-CM | POA: Diagnosis not present

## 2023-03-15 DIAGNOSIS — M25551 Pain in right hip: Secondary | ICD-10-CM

## 2023-03-15 DIAGNOSIS — I6522 Occlusion and stenosis of left carotid artery: Secondary | ICD-10-CM

## 2023-03-15 DIAGNOSIS — E782 Mixed hyperlipidemia: Secondary | ICD-10-CM | POA: Diagnosis not present

## 2023-03-15 DIAGNOSIS — I69351 Hemiplegia and hemiparesis following cerebral infarction affecting right dominant side: Secondary | ICD-10-CM | POA: Diagnosis not present

## 2023-03-15 DIAGNOSIS — I6930 Unspecified sequelae of cerebral infarction: Secondary | ICD-10-CM | POA: Diagnosis not present

## 2023-03-15 DIAGNOSIS — R531 Weakness: Secondary | ICD-10-CM | POA: Diagnosis not present

## 2023-03-15 DIAGNOSIS — E1142 Type 2 diabetes mellitus with diabetic polyneuropathy: Secondary | ICD-10-CM | POA: Diagnosis not present

## 2023-03-15 DIAGNOSIS — R1310 Dysphagia, unspecified: Secondary | ICD-10-CM

## 2023-03-15 DIAGNOSIS — J45909 Unspecified asthma, uncomplicated: Secondary | ICD-10-CM | POA: Diagnosis not present

## 2023-03-15 DIAGNOSIS — F32A Depression, unspecified: Secondary | ICD-10-CM | POA: Diagnosis not present

## 2023-03-15 DIAGNOSIS — I69391 Dysphagia following cerebral infarction: Secondary | ICD-10-CM | POA: Diagnosis not present

## 2023-03-15 HISTORY — PX: IR RADIOLOGIST EVAL & MGMT: IMG5224

## 2023-03-15 NOTE — Assessment & Plan Note (Signed)
 Cholesterol levels not as low as desired post-stroke. Increased Crestor (rosuvastatin) dose to 20 mg daily. Discussed importance of lowering cholesterol to reduce future stroke risk. - Continue Crestor 20 mg daily - Recheck cholesterol levels at next appointment in April

## 2023-03-15 NOTE — Assessment & Plan Note (Signed)
 Switched from omeprazole to pantoprazole to avoid interaction with Plavix. Explained that omeprazole can interfere with Plavix effectiveness. - Continue pantoprazole instead of omeprazole

## 2023-03-15 NOTE — Progress Notes (Signed)
 Established patient visit   Patient: Natasha Vasquez   DOB: [birth year]   83 y.o. Female  MRN: [id number] Visit Date: 03/15/2023  Today's healthcare provider: Shirlee Latch, MD   Chief Complaint  Patient presents with   Hospitalization Follow-up    Patient was admitted to [facility] on 02/25/23 and discharged on 03/01/23 . She was treated for CVA. 1 Month f/u scheuled with neurology and waiting on other office to call  Patient reports she is feeling fine since discharged Went to see [facility] on Wednesday about her hip   Subjective    HPI HPI     Hospitalization Follow-up    Additional comments: Patient was admitted to [facility] on 02/25/23 and discharged on 03/01/23 . She was treated for CVA. 1 Month f/u scheuled with neurology and waiting on other office to call  Patient reports she is feeling fine since discharged Went to see [facility] on Wednesday about her hip      Last edited by Acey Lav, CMA on 03/15/2023 11:17 AM.       Discussed the use of AI scribe software for clinical note transcription with the patient, who gave verbal consent to proceed.  History of Present Illness   The patient, with a recent history of stroke, presents with significant hip pain that has worsened since the stroke. The pain is described as excruciating, particularly when moving in certain ways, and is severe enough to wake the patient from sleep. The patient reports that the pain radiates from the hip down towards the groin. The patient also reports right-sided weakness, particularly in the right arm and leg, which has improved slightly but is still present. The patient is also experiencing dysphagia, particularly with liquids, and has some difficulty with word retrieval. The patient is currently on Plavix and aspirin for stroke prevention, and is scheduled to have a procedure to address an occlusion in the left internal carotid artery. The patient's other medical conditions include diabetes (A1c 6.6) and  high cholesterol, for which she is taking Crestor.        Medications: Outpatient Medications Prior to Visit  Medication Sig   acetaminophen (TYLENOL) 500 MG tablet Take 500 mg by mouth every 6 (six) hours as needed.   albuterol (VENTOLIN HFA) 108 (90 Base) MCG/ACT inhaler TAKE 2 PUFFS BY MOUTH EVERY 6 HOURS AS NEEDED FOR WHEEZE OR SHORTNESS OF BREATH   allopurinol (ZYLOPRIM) 100 MG tablet TAKE 2 TABLETS BY MOUTH EVERY DAY   ARIPiprazole (ABILIFY) 2 MG tablet TAKE 1 TABLET BY MOUTH EVERY DAY   aspirin 81 MG tablet Take 1 tablet (81 mg total) by mouth daily for 21 days.   bismuth subsalicylate (PEPTO BISMOL) 262 MG chewable tablet Chew 262 mg by mouth as needed. **capsule**   calcium carbonate (TUMS - DOSED IN MG ELEMENTAL CALCIUM) 500 MG chewable tablet Chew 1 tablet by mouth as needed for indigestion or heartburn.   carboxymethylcellulose (REFRESH PLUS) 0.5 % SOLN Place 1 drop into both eyes 3 (three) times daily as needed (Dry Eyes).   carvedilol (COREG) 6.25 MG tablet Take 1 tablet (6.25 mg total) by mouth 2 (two) times daily.   Cholecalciferol (VITAMIN D-3 PO) Take 1 tablet by mouth daily.   clopidogrel (PLAVIX) 75 MG tablet Take 1 tablet (75 mg total) by mouth daily.   colestipol (COLESTID) 1 g tablet TAKE 1 TABLET BY MOUTH 2 TIMES DAILY.   famotidine (PEPCID) 20 MG tablet TAKE 1 TABLET BY MOUTH TWICE A  DAY   ferrous sulfate 324 MG TBEC Take 324 mg by mouth daily with breakfast.   fluocinonide (LIDEX) 0.05 % external solution APPLY TO AFFECTED AREAS OF SCALP DAILY UNTIL CLEAR, THEN FOR FLARES   Fluticasone-Umeclidin-Vilant (TRELEGY ELLIPTA) 100-62.5-25 MCG/ACT AEPB Inhale 1 puff into the lungs daily.   furosemide (LASIX) 20 MG tablet TAKE 1 TABLET BY MOUTH EVERY DAY (Patient not taking: Reported on 03/04/2023)   lisinopril (ZESTRIL) 10 MG tablet Take 1 tablet (10 mg total) by mouth daily.   Multiple Vitamins-Minerals (CENTRUM SILVER PO) Take 1 tablet by mouth daily.   oxybutynin  (DITROPAN-XL) 10 MG 24 hr tablet TAKE 1 TABLET BY MOUTH EVERY DAY   pantoprazole (PROTONIX) 40 MG tablet Take 1 tablet (40 mg total) by mouth 2 (two) times daily before a meal.   potassium chloride (KLOR-CON) 10 MEQ tablet TAKE 1 TABLET BY MOUTH EVERY DAY   rosuvastatin (CRESTOR) 20 MG tablet Take 1 tablet (20 mg total) by mouth daily.   tirzepatide [facility]) 2.5 MG/0.5ML Pen Inject 2.5 mg into the skin once a week.   Venlafaxine HCl 225 MG TB24 TAKE 1 TABLET (225 MG TOTAL) BY MOUTH DAILY. PLEASE SCHEDULE OFFICE VISIT BEFORE ANY FUTURE REFILLS   No facility-administered medications prior to visit.    Review of Systems     Objective    BP 130/78 (BP Location: Left Arm, Patient Position: Sitting, Cuff Size: Large)   Pulse 64   Ht 5\' 5"  (1.651 m)   Wt 192 lb 14.4 oz (87.5 kg)   SpO2 97%   BMI 32.10 kg/m    Physical Exam Vitals reviewed.  Constitutional:      General: She is not in acute distress.    Appearance: Normal appearance. She is well-developed. She is not diaphoretic.  HENT:     Head: Normocephalic and atraumatic.  Eyes:     General: No scleral icterus.    Conjunctiva/sclera: Conjunctivae normal.  Neck:     Thyroid: No thyromegaly.  Cardiovascular:     Rate and Rhythm: Normal rate and regular rhythm.     Heart sounds: Normal heart sounds. No murmur heard. Pulmonary:     Effort: Pulmonary effort is normal. No respiratory distress.     Breath sounds: Normal breath sounds. No wheezing, rhonchi or rales.  Abdominal:     General: There is no distension.     Palpations: Abdomen is soft.     Tenderness: There is no abdominal tenderness.  Musculoskeletal:     Cervical back: Neck supple.     Right lower leg: No edema.     Left lower leg: No edema.  Lymphadenopathy:     Cervical: No cervical adenopathy.  Skin:    General: Skin is warm and dry.     Findings: No rash.  Neurological:     Mental Status: She is alert and oriented to person, place, and time. Mental  status is at baseline.     Motor: Weakness (R sided) present.     Comments: Slight word finding difficulty  Psychiatric:        Mood and Affect: Mood normal.        Behavior: Behavior normal.      No results found for any visits on 03/15/23.  Assessment & Plan     Problem List Items Addressed This Visit       Cardiovascular and Mediastinum   Hypertension associated with diabetes (HCC)   Relevant Orders   Basic metabolic panel  ICAO (internal carotid artery occlusion), left - Primary     Digestive   GERD (gastroesophageal reflux disease)   Switched from omeprazole to pantoprazole to avoid interaction with Plavix. Explained that omeprazole can interfere with Plavix effectiveness. - Continue pantoprazole instead of omeprazole      Dysphagia   Relevant Orders   Ambulatory referral to Speech Therapy     Nervous and Auditory   Right sided weakness   Relevant Orders   Ambulatory referral to Physical Therapy   Ambulatory referral to Speech Therapy   Ambulatory referral to Occupational Therapy     Other   HLD (hyperlipidemia)   Cholesterol levels not as low as desired post-stroke. Increased Crestor (rosuvastatin) dose to 20 mg daily. Discussed importance of lowering cholesterol to reduce future stroke risk. - Continue Crestor 20 mg daily - Recheck cholesterol levels at next appointment in April      History of cerebrovascular accident (CVA) with residual deficit   Recent left-sided stroke affecting the midbrain and posterior regions, resulting in right-sided weakness and imbalance. MRI confirmed stroke; CTA revealed left internal carotid artery occlusion. Currently on Plavix and baby aspirin. Discussed bleeding risks with NSAIDs while on Plavix and importance of managing cholesterol and blood pressure. Carotid artery procedure aims to prevent future strokes. - Continue Plavix - Discontinue aspirin after three weeks from February 14th - Follow-up with neurointerventional  radiologist for carotid artery occlusion management - Recheck kidney function and electrolytes      Relevant Orders   Basic metabolic panel   Ambulatory referral to Physical Therapy   Ambulatory referral to Speech Therapy   Ambulatory referral to Occupational Therapy   Other Visit Diagnoses       Right hip pain              Hip Pain Severe hip pain likely due to cartilage wear around the hip socket, exacerbated by right-sided weakness post-stroke. Pain affects sleep and certain movements. Orthopedic evaluation suggested surgery, but it is currently too risky due to recent stroke. Discussed potential benefit of physical therapy to strengthen muscles around the hip and possibly avoid surgery. - Scheduled Tylenol 1000 mg twice daily - Referral to physical therapy at [facility] - Referral to occupational therapy at [facility]  Dysphagia Difficulty swallowing liquids, leading to coughing and choking, likely related to recent stroke. Discussed role of speech therapy in improving swallowing function and reducing aspiration risk. - Referral to speech therapy at [facility] Maintenance A1c is 6.6, within target range. Continue managing risk factors for stroke prevention. - Continue monitoring A1c - Continue managing cholesterol, blood pressure, and blood sugar levels  Follow-up - Follow-up with neurologist Dr. Pearlean Brownie in April - Follow-up with Dr. Jayme Cloud next week - Get labs done during next week's visit to Dr. Jayme Cloud.         Return if symptoms worsen or fail to improve.      Total time spent on today's visit was greater than 40 minutes, including both face-to-face time and nonface-to-face time personally spent on review of chart (labs and imaging), discussing labs and goals, discussing further work-up, treatment options, answering patient's questions, and coordinating care.    Shirlee Latch, MD  [facility] [phone]  (phone) [phone] (fax)  [facility]

## 2023-03-15 NOTE — Assessment & Plan Note (Signed)
 Recent left-sided stroke affecting the midbrain and posterior regions, resulting in right-sided weakness and imbalance. MRI confirmed stroke; CTA revealed left internal carotid artery occlusion. Currently on Plavix and baby aspirin. Discussed bleeding risks with NSAIDs while on Plavix and importance of managing cholesterol and blood pressure. Carotid artery procedure aims to prevent future strokes. - Continue Plavix - Discontinue aspirin after three weeks from February 14th - Follow-up with neurointerventional radiologist for carotid artery occlusion management - Recheck kidney function and electrolytes

## 2023-03-18 ENCOUNTER — Telehealth: Payer: Self-pay | Admitting: *Deleted

## 2023-03-18 ENCOUNTER — Telehealth: Payer: Self-pay

## 2023-03-18 DIAGNOSIS — E1159 Type 2 diabetes mellitus with other circulatory complications: Secondary | ICD-10-CM

## 2023-03-18 NOTE — Patient Outreach (Signed)
 [facility] Stroke Care Coordination Follow Up  03/18/2023 Name:  Mihira Tozzi MRN:  [id number] DOB:  [birth year]  Subjective: Sanari Offner is a 83 y.o. year old female who is a primary care patient of Beryle Flock, Marzella Schlein, MD An Emmi alert was received indicating patient responded to questions: Went to follow-up appointment?. I reached out by phone to follow up on the alert and spoke to Patient.  Patient did attend follow up on 03/15/23, verified in EMR, no concerns voiced per patient, states she has all medications and taking as prescribed.  Care Coordination Interventions:  Yes, provided   Follow up plan: No further intervention required.   Encounter Outcome:  Patient Visit Completed   Irving Shows [facility], BSN RN Care Manager/ Transition of Care [place]/ [facility] [phone]

## 2023-03-19 DIAGNOSIS — E1159 Type 2 diabetes mellitus with other circulatory complications: Secondary | ICD-10-CM | POA: Diagnosis not present

## 2023-03-19 DIAGNOSIS — J45909 Unspecified asthma, uncomplicated: Secondary | ICD-10-CM | POA: Diagnosis not present

## 2023-03-19 DIAGNOSIS — I69391 Dysphagia following cerebral infarction: Secondary | ICD-10-CM | POA: Diagnosis not present

## 2023-03-19 DIAGNOSIS — I152 Hypertension secondary to endocrine disorders: Secondary | ICD-10-CM | POA: Diagnosis not present

## 2023-03-19 DIAGNOSIS — I69351 Hemiplegia and hemiparesis following cerebral infarction affecting right dominant side: Secondary | ICD-10-CM | POA: Diagnosis not present

## 2023-03-19 DIAGNOSIS — G4733 Obstructive sleep apnea (adult) (pediatric): Secondary | ICD-10-CM | POA: Diagnosis not present

## 2023-03-19 DIAGNOSIS — E1142 Type 2 diabetes mellitus with diabetic polyneuropathy: Secondary | ICD-10-CM | POA: Diagnosis not present

## 2023-03-19 DIAGNOSIS — F32A Depression, unspecified: Secondary | ICD-10-CM | POA: Diagnosis not present

## 2023-03-19 DIAGNOSIS — I519 Heart disease, unspecified: Secondary | ICD-10-CM | POA: Diagnosis not present

## 2023-03-20 ENCOUNTER — Ambulatory Visit: Payer: Medicare PPO | Admitting: Pulmonary Disease

## 2023-03-20 ENCOUNTER — Encounter: Payer: Self-pay | Admitting: Pulmonary Disease

## 2023-03-20 VITALS — BP 120/78 | HR 81 | Temp 96.8°F | Ht 65.0 in | Wt 190.2 lb

## 2023-03-20 DIAGNOSIS — G4733 Obstructive sleep apnea (adult) (pediatric): Secondary | ICD-10-CM

## 2023-03-20 DIAGNOSIS — Z8673 Personal history of transient ischemic attack (TIA), and cerebral infarction without residual deficits: Secondary | ICD-10-CM | POA: Diagnosis not present

## 2023-03-20 DIAGNOSIS — G4734 Idiopathic sleep related nonobstructive alveolar hypoventilation: Secondary | ICD-10-CM

## 2023-03-20 DIAGNOSIS — J45909 Unspecified asthma, uncomplicated: Secondary | ICD-10-CM

## 2023-03-20 DIAGNOSIS — I6522 Occlusion and stenosis of left carotid artery: Secondary | ICD-10-CM | POA: Diagnosis not present

## 2023-03-20 DIAGNOSIS — R0609 Other forms of dyspnea: Secondary | ICD-10-CM

## 2023-03-20 NOTE — Progress Notes (Signed)
 Subjective:    Patient ID: Natasha Vasquez, female    DOB: 1940/03/12, 83 y.o.   MRN: [id number]  Patient Care Team: Erasmo Downer, MD as PCP - General (Family Medicine) Mariah Milling Tollie Pizza, MD as PCP - Cardiology (Cardiology) Tera Partridge, PA as Physician Assistant (Physician Assistant) Ernest Pine, Illene Labrador, MD as Consulting Physician (Orthopedic Surgery) Byrnett, Merrily Pew, MD (General Surgery) Dasher, Cliffton Asters, MD (Dermatology) Earna Coder, MD as Consulting Physician (Internal Medicine) Lockie Mola, MD as Referring Physician (Ophthalmology) Salena Saner, MD as Consulting Physician (Pulmonary Disease) Loreli Slot, MD as Consulting Physician (Cardiothoracic Surgery)  Chief Complaint  Patient presents with   Follow-up    DOE. Wheezing. Cough.     BACKGROUND/INTERVAL: Natasha Vasquez is an 83 year old very remote former smoker with minimal tobacco history, who presents for follow-up on the issue of dyspnea on exertion that she expresses mostly as fatigue.  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  She was last seen here on 25 January 2023, this is a follow-up visit.  Since her prior visit she was admitted to [facility] on 24 February 2018 25 through 01 March 2023 with a CVA.  She was noted to have left ICA stenosis and MRI of the brain that showed infarction within the left midbrain and left cerebral peduncle.  The patient was not a candidate for TNK due to presenting outside of the window.  HPI Discussed the use of AI scribe software for clinical note transcription with the patient, who gave verbal consent to proceed.  History of Present Illness   Natasha Vasquez "Natasha Vasquez" is an 83 year old female who presents with shortness of breath and recent stroke.  She experiences persistent shortness of breath and fatigue. Her oxygen levels increase with exercise, but she continues to experience fatigue and shortness of  breath, possibly due to her heart rate not increasing appropriately during physical activity. She uses albuterol as needed and regularly uses CPAP and oxygen therapy. She no longer takes Trelegy as it was not effective.  She recently suffered a stroke on February 10th while on her way to a neurologist appointment, leading her to go directly to the emergency room. The stroke has contributed to her fatigue and deconditioning.  She has a nearly blocked carotid artery that requires surgical intervention. She is awaiting scheduling for this procedure and is under the care of Dr. Thayer Jew for this issue.   She has not had any cough, sputum production, hemoptysis nor fevers or chills.  Continues to have issues with fatigue.  She had a 6-minute walk on 31 January 2023 that showed oxygen saturations actually INCREASING with exercise.  Heart rate remained blunted throughout the exercise.  Her sleep apnea device compliance shows 86.7% this reflects of course the time the patient was hospitalized.  Residual AHI 1.7.  DATA: 04/03/2021 echocardiogram: LVEF 60 to 65%, no regional wall motion abnormalities.  Moderate LVH. Grade 1 DD.  RV function normal. 04/20/2021 PFTs: FEV1 1.95 L or 99% predicted, FVC 2.63 L or 102% predicted, FEV1/FVC 74%, no bronchodilator response.  Lung volumes normal with minimal air trapping.  Diffusion capacity mildly reduced.  Overall normal study. 05/04/2021 CT angio chest: Left upper lobe and lingular pneumonia, no pulmonary embolism 06/01/2021 CT coronary: Coronary score 0, no overt coronary findings on lung windows, small hiatal hernia. 08/18/2021 split-night sleep study: Consistent with severe obstructive sleep apnea and treatment emergent central sleep apnea, titration  was not successful.  Eventually patient titrated to auto CPAP.  AHI was 90.5/h, O2 saturations as low as 78%. 06/01/2021 CT coronary: Normal coronary calcium score of 0, mild to moderately dilated ascending aorta  (known issue), no abnormalities on the pulmonary parenchyma visualized.  Small sliding hiatal hernia. 03/09/2022 CT chest contrast: Enlarging ascending thoracic aortic aneurysm measuring up to 4.8 cm, hepatic steatosis, small hiatal hernia, aortic atherosclerosis. 08/13/2022 CT chest with contrast: Unchanged size of thoracic aortic aneurysm estimated to be 4.7 cm concurrent study.  Central airways clear.  No pleural effusion.  No airspace disease.  Hiatal hernia present.  No other acute finding in the upper abdomen. 08/23/2022 PFTs: FEV1 1.64 L or 83% predicted, FVC 2.48 L or 93% predicted, FEV1/FVC 66%, diffusion capacity normal, lung volumes normal.  Normal airway obstruction presents suggesting small airways disease.  There is bronchodilator response.  Review of Systems A 10 point review of systems was performed and it is as noted above otherwise negative.   Patient Active Problem List   Diagnosis Date Noted   History of cerebrovascular accident (CVA) with residual deficit 03/15/2023   ICAO (internal carotid artery occlusion), left 02/26/2023   Brainstem infarct, acute (HCC) 02/26/2023   Right sided weakness 02/25/2023   CVA (cerebrovascular accident) (HCC) 02/25/2023   Moderate episode of recurrent major depressive disorder (HCC) 02/11/2023   Sciatic pain, left 09/27/2021   SOB (shortness of breath) 05/16/2021   Aneurysm of ascending aorta without rupture (HCC) 05/10/2021   HLD (hyperlipidemia) 05/04/2021   Elevated troponin 05/04/2021   Chronic diastolic CHF (congestive heart failure) (HCC) 05/04/2021   Recurrent major depressive disorder, in partial remission (HCC) 03/24/2021   Gout 03/24/2021   PAD (peripheral artery disease) (HCC) 06/24/2020   Iron deficiency 10/07/2018   Primary osteoarthritis of left knee 09/17/2018   Emphysema lung (HCC) 03/14/2018   Diabetic peripheral neuropathy associated with type 2 diabetes mellitus (HCC) 03/14/2018   Metatarsalgia of both feet 03/14/2018    Abnormality of lung 03/14/2018   Acquired absence of right breast 06/11/2017   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   Dysphagia 03/11/2017   Obesity 03/11/2017   Subclinical hypothyroidism 06/08/2015   Osteopenia 06/08/2015   Billowing mitral valve 06/08/2015   Melanoma in situ (HCC) 06/08/2015   Cramps of lower extremity 06/08/2015   Type 2 diabetes mellitus (HCC) 05/18/2015   Chronic diarrhea 05/18/2015   Anemia 05/18/2015   Clinical depression 01/26/2015   Hyperlipidemia associated with type 2 diabetes mellitus (HCC) 10/28/2014   Hypertension associated with diabetes (HCC) 09/27/2014   GERD (gastroesophageal reflux disease) 08/06/2014   Overactive bladder 07/16/2014    Social History   Tobacco Use   Smoking status: Former    Current packs/day: 0.00    Types: Cigarettes    Start date: 1963    Quit date: 1965    Years since quitting: 60.2   Smokeless tobacco: Never   Tobacco comments:    smoked in college--1 pack would last one week.  Substance Use Topics   Alcohol use: Yes    Alcohol/week: 7.0 - 14.0 standard drinks of alcohol    Types: 7 - 14 Glasses of wine per week    Comment: 1-2 glasses of wine per night    Allergies  Allergen Reactions   Codeine Other (See Comments)    Chest pain   Quinolones     PATIENT UNAWARE OF allergy  to this class of antibiotics, only aware of allergy to [facility] Patient was warned about not using Cipro and similar antibiotics. Recent studies have raised concern that fluoroquinolone antibiotics could be associated with an increased risk of aortic aneurysm Fluoroquinolones have non-antimicrobial properties that might jeopardise the integrity of the extracellular matrix of the vascular wall In a  propensity score matched cohort study in [place], there was a 66% increased rate   Aleve [Naproxen Sodium] Hives, Rash and Other (See Comments)     headaches   Lorazepam Other (See Comments)    "Feels loopy"   Oxycodone Other (See Comments)    MENTAL CHANGES   Paregoric Other (See Comments)    Chest pain---tolerated MORPHINE   Penicillins Hives    Can take Augmentin    Current Meds  Medication Sig   acetaminophen (TYLENOL) 500 MG tablet Take 500 mg by mouth every 6 (six) hours as needed.   albuterol (VENTOLIN HFA) 108 (90 Base) MCG/ACT inhaler TAKE 2 PUFFS BY MOUTH EVERY 6 HOURS AS NEEDED FOR WHEEZE OR SHORTNESS OF BREATH   allopurinol (ZYLOPRIM) 100 MG tablet TAKE 2 TABLETS BY MOUTH EVERY DAY   ARIPiprazole (ABILIFY) 2 MG tablet TAKE 1 TABLET BY MOUTH EVERY DAY   [EXPIRED] aspirin 81 MG tablet Take 1 tablet (81 mg total) by mouth daily for 21 days.   bismuth subsalicylate (PEPTO BISMOL) 262 MG chewable tablet Chew 262 mg by mouth as needed. **capsule**   calcium carbonate (TUMS - DOSED IN MG ELEMENTAL CALCIUM) 500 MG chewable tablet Chew 1 tablet by mouth as needed for indigestion or heartburn.   carboxymethylcellulose (REFRESH PLUS) 0.5 % SOLN Place 1 drop into both eyes 3 (three) times daily as needed (Dry Eyes).   carvedilol (COREG) 6.25 MG tablet Take 1 tablet (6.25 mg total) by mouth 2 (two) times daily.   Cholecalciferol (VITAMIN D-3 PO) Take 1 tablet by mouth daily.   clopidogrel (PLAVIX) 75 MG tablet Take 1 tablet (75 mg total) by mouth daily.   colestipol (COLESTID) 1 g tablet TAKE 1 TABLET BY MOUTH 2 TIMES DAILY.   famotidine (PEPCID) 20 MG tablet TAKE 1 TABLET BY MOUTH TWICE A DAY   ferrous sulfate 324 MG TBEC Take 324 mg by mouth daily with breakfast.   fluocinonide (LIDEX) 0.05 % external solution APPLY TO AFFECTED AREAS OF SCALP DAILY UNTIL CLEAR, THEN FOR FLARES   Fluticasone-Umeclidin-Vilant (TRELEGY ELLIPTA) 100-62.5-25 MCG/ACT AEPB Inhale 1 puff into the lungs daily.   lisinopril (ZESTRIL) 10 MG tablet Take 1 tablet (10 mg total) by mouth daily.   Multiple Vitamins-Minerals (CENTRUM SILVER PO) Take 1 tablet by  mouth daily.   oxybutynin (DITROPAN-XL) 10 MG 24 hr tablet TAKE 1 TABLET BY MOUTH EVERY DAY   pantoprazole (PROTONIX) 40 MG tablet Take 1 tablet (40 mg total) by mouth 2 (two) times daily before a meal.   potassium chloride (KLOR-CON) 10 MEQ tablet TAKE 1 TABLET BY MOUTH EVERY DAY   rosuvastatin (CRESTOR) 20 MG tablet Take 1 tablet (20 mg total) by mouth daily.   tirzepatide [facility]) 2.5 MG/0.5ML Pen Inject 2.5 mg into the skin once a week.   Venlafaxine HCl 225 MG TB24 TAKE 1 TABLET (225 MG TOTAL) BY MOUTH DAILY. PLEASE SCHEDULE OFFICE VISIT BEFORE ANY FUTURE REFILLS    Immunization History  Administered Date(s) Administered   Fluad Quad(high Dose 65+) 09/09/2018, 10/12/2019, 10/14/2020, 09/25/2021   Influenza Split 10/24/2011   Influenza, High Dose Seasonal PF 11/18/2013, 01/24/2016, 10/08/2016, 09/12/2017, 10/12/2022  Influenza-Unspecified 10/24/2011, 10/14/2012, 11/18/2013   Moderna Sars-Covid-2 Vaccination 01/30/2019, 03/02/2019   Pfizer(Comirnaty)Fall Seasonal Vaccine 12 years and older 10/12/2022   Pneumococcal Conjugate-13 08/21/2013   Pneumococcal Polysaccharide-23 01/24/2016   Respiratory Syncytial Virus Vaccine,Recomb Aduvanted(Arexvy) 11/11/2021   Tdap 10/24/2011   Zoster Recombinant(Shingrix) 04/01/2016, 08/14/2016        Objective:     BP 120/78 (BP Location: Left Arm, Cuff Size: Large)   Pulse 81   Temp (!) 96.8 F (36 C)   Ht 5\' 5"  (1.651 m)   Wt 190 lb 3.2 oz (86.3 kg)   SpO2 95%   BMI 31.65 kg/m   SpO2: 95 % O2 Device: None (Room air)  GENERAL: Overweight woman, spry, age-appropriate, fully ambulatory, mild conversational dyspnea. HEAD: Normocephalic, atraumatic.  EYES: Pupils equal, round, reactive to light.  No scleral icterus.  MOUTH: Oral mucosa moist, no thrush. NECK: Supple. No thyromegaly. Trachea midline. No JVD.  No adenopathy.  Faint bilateral carotid bruits. PULMONARY: Good air entry bilaterally.  Faint end expiratory wheezes  noted.Marland Kitchen CARDIOVASCULAR: S1 and S2. Regular rate and rhythm.  Has a grade 1/6 systolic ejection murmur in the mitral position. ABDOMEN: Protuberant, otherwise benign MUSCULOSKELETAL: No joint deformity, no clubbing, no edema.  NEUROLOGIC: No overt focal deficit, gait unsteadiness due to recent stroke, speech is fluent. SKIN: Intact,warm,dry. PSYCH: Mood and behavior normal.     Assessment & Plan:     ICD-10-CM   1. Dyspnea on exertion  R06.09     2. Nocturnal hypoxia  G47.34     3. OSA (obstructive sleep apnea) - COMPLEX  G47.33     4. ICAO (internal carotid artery occlusion), left  I65.22     5. Brainstem infarct, recent, without late effect  Z86.73      Assessment and Plan    Shortness of breath/Fatigue Shortness of breath is likely multifactorial, with contributions from deconditioning and medication effects. Carvedilol may contribute to the shortness of breath, as heart rate blunted during exercise. The six-minute walk test showed an increase in oxygen levels with exercise, indicating adequate lung function. Discontinuation of Trelegy is reasonable given the minimal asthma component. - Continue albuterol as needed for wheezing. - Continue CPAP and oxygen therapy.  Asthma Minimal asthma component, not the primary issue. Albuterol effectively manages wheezing episodes. - Consider alternative inhaler (AirSupra) inhaler refill is needed.  Recent stroke Recent stroke on March 10th, likely due to nearly blocked carotid artery, contributing to fatigue and deconditioning. Procedure complexity necessitates performance in [place]. - Await scheduling of carotid artery procedure with Dr. Thayer Jew. - Contact Dr. Garnette Gunner office if no communication within a week regarding procedure scheduling.  Carotid artery stenosis Nearly blocked carotid artery, likely cause of recent stroke. Medically stable from a pulmonary standpoint for the procedure, which requires performance in  [place] due to complexity. - Document medical stability from a pulmonary standpoint for carotid artery procedure.  Follow-up Requires follow-up to monitor condition and treatment response. - Schedule follow-up appointment in three months to assess progress and response to treatment.       Advised if symptoms do not improve or worsen, to please contact office for sooner follow up or seek emergency care.    I spent 40 minutes of dedicated to the care of this patient on the date of this encounter to include pre-visit review of records, face-to-face time with the patient discussing conditions above, post visit ordering of testing, clinical documentation with the electronic health record, making appropriate referrals as documented, and communicating  necessary findings to members of the patients care team.     C. Danice Goltz, MD Advanced Bronchoscopy PCCM [facility]    *This note was generated using voice recognition software/Dragon and/or AI transcription program.  Despite best efforts to proofread, errors can occur which can change the meaning. Any transcriptional errors that result from this process are unintentional and may not be fully corrected at the time of dictation.

## 2023-03-20 NOTE — Patient Instructions (Addendum)
 VISIT SUMMARY:  Natasha Vasquez, an 83 year old female, visited [place] today due to persistent shortness of breath and recent stroke. She experiences fatigue and shortness of breath, possibly due to her heart rate not increasing appropriately during physical activity. She uses albuterol as needed and regularly uses CPAP and oxygen therapy. She recently suffered a stroke on March 10th, which has contributed to her fatigue and deconditioning. She has a nearly blocked carotid artery that requires surgical intervention and is awaiting scheduling for this procedure.  YOUR PLAN:  -SHORTNESS OF BREATH: Your shortness of breath is likely due to multiple factors, including deconditioning and medication effects. Carvedilol may be contributing to this issue as it keeps your heart rate relatively blunted during exercise. You should continue using albuterol as needed for wheezing and continue with CPAP and oxygen therapy.  -ASTHMA: Your asthma is minimal and not the primary issue. Albuterol is effectively managing your wheezing episodes. If you need a refill for your Asmida inhaler, we can consider an alternative inhaler.  -RECENT STROKE: You recently had a stroke on March 10th, likely due to a nearly blocked carotid artery. This has contributed to your fatigue and deconditioning. We are awaiting the scheduling of your carotid artery procedure with Dr. Corliss Skains . If you do not hear from Dr. Fatima Sanger office within a week, please contact them.  -CAROTID ARTERY STENOSIS: You have a nearly blocked carotid artery, which is likely the cause of your recent stroke. You are medically stable from a pulmonary standpoint for the procedure, which needs to be performed in [place] due to its complexity.  INSTRUCTIONS:  Please schedule a follow-up appointment in three months to assess your progress and response to treatment. Additionally, if you do not hear from Dr. Corliss Skains 's office within a week regarding the scheduling of your  carotid artery procedure, please contact them.  For more information, you can read your full clinical note, available in your patient portal.

## 2023-03-21 ENCOUNTER — Telehealth: Payer: Self-pay

## 2023-03-21 DIAGNOSIS — I69351 Hemiplegia and hemiparesis following cerebral infarction affecting right dominant side: Secondary | ICD-10-CM | POA: Diagnosis not present

## 2023-03-21 DIAGNOSIS — F32A Depression, unspecified: Secondary | ICD-10-CM | POA: Diagnosis not present

## 2023-03-21 DIAGNOSIS — G4733 Obstructive sleep apnea (adult) (pediatric): Secondary | ICD-10-CM | POA: Diagnosis not present

## 2023-03-21 DIAGNOSIS — E1159 Type 2 diabetes mellitus with other circulatory complications: Secondary | ICD-10-CM | POA: Diagnosis not present

## 2023-03-21 DIAGNOSIS — E1142 Type 2 diabetes mellitus with diabetic polyneuropathy: Secondary | ICD-10-CM | POA: Diagnosis not present

## 2023-03-21 DIAGNOSIS — J45909 Unspecified asthma, uncomplicated: Secondary | ICD-10-CM | POA: Diagnosis not present

## 2023-03-21 DIAGNOSIS — I152 Hypertension secondary to endocrine disorders: Secondary | ICD-10-CM | POA: Diagnosis not present

## 2023-03-21 DIAGNOSIS — I519 Heart disease, unspecified: Secondary | ICD-10-CM | POA: Diagnosis not present

## 2023-03-21 DIAGNOSIS — I69391 Dysphagia following cerebral infarction: Secondary | ICD-10-CM | POA: Diagnosis not present

## 2023-03-21 NOTE — Telephone Encounter (Signed)
 Copied from CRM [id number]. Topic: Clinical - Home Health Verbal Orders >> Mar 21, 2023 11:37 AM Fuller Mandril wrote: Caller/Agency: Lawson Fiscal with [facility]  Callback Number: [phone] Service Requested: Discharge from Nursing and PT with [facility] so she can get services from [facility] where she lives. Need Discharge order   Frequency: N/A Any new concerns about the patient? N/A

## 2023-03-21 NOTE — Telephone Encounter (Signed)
 OK for verbals

## 2023-03-22 ENCOUNTER — Other Ambulatory Visit (HOSPITAL_COMMUNITY): Payer: Self-pay | Admitting: Interventional Radiology

## 2023-03-22 DIAGNOSIS — I771 Stricture of artery: Secondary | ICD-10-CM

## 2023-03-22 NOTE — Telephone Encounter (Signed)
 Ok to advise if call is returned

## 2023-03-22 NOTE — Telephone Encounter (Signed)
Verbals given on secure line

## 2023-03-28 ENCOUNTER — Telehealth: Payer: Self-pay

## 2023-03-28 NOTE — Telephone Encounter (Signed)
 Copied from CRM [id number]. Topic: Clinical - Home Health Verbal Orders >> Mar 28, 2023  8:52 AM Higinio Roger wrote: Patient is requesting Dr. Beryle Flock send orders for outpatient Occupational therapy and Physical therapy be  faxed to Sue Lush at [facility]  Fax #: [phone]  Patient Callback#: [phone]

## 2023-03-28 NOTE — Telephone Encounter (Signed)
 I put these in at last visit. Can we just print and fax them over

## 2023-03-29 ENCOUNTER — Other Ambulatory Visit: Payer: Medicare PPO

## 2023-03-29 NOTE — Telephone Encounter (Signed)
 Printed copies or both orders faxed back this morning @ 9:26 am

## 2023-03-30 ENCOUNTER — Encounter: Payer: Self-pay | Admitting: Pulmonary Disease

## 2023-04-01 ENCOUNTER — Other Ambulatory Visit (HOSPITAL_COMMUNITY): Payer: Self-pay

## 2023-04-01 ENCOUNTER — Other Ambulatory Visit (HOSPITAL_COMMUNITY)
Admission: RE | Admit: 2023-04-01 | Discharge: 2023-04-01 | Disposition: A | Discharge status: Home / Self Care | Attending: Rheumatology | Admitting: Rheumatology

## 2023-04-01 ENCOUNTER — Other Ambulatory Visit (HOSPITAL_COMMUNITY): Payer: Self-pay | Admitting: Interventional Radiology

## 2023-04-01 DIAGNOSIS — I771 Stricture of artery: Secondary | ICD-10-CM | POA: Insufficient documentation

## 2023-04-01 LAB — PLATELET INHIBITION P2Y12: Platelet Function  P2Y12: 114 [PRU] — ABNORMAL LOW (ref 182–335)

## 2023-04-02 ENCOUNTER — Telehealth (HOSPITAL_COMMUNITY): Payer: Self-pay | Admitting: Radiology

## 2023-04-02 NOTE — Telephone Encounter (Signed)
 Called pt to discuss a couple of concerns prior to her upcoming procedure with Dr. Corliss Skains. Left a VM for her to call back. Calling to verify that is on Aspirin 81mg  every day and to see about an appt with neurology. JM

## 2023-04-03 ENCOUNTER — Ambulatory Visit: Payer: Self-pay

## 2023-04-03 NOTE — Telephone Encounter (Signed)
 Copied from CRM [id number]. Topic: Clinical - Red Word Triage >> Apr 03, 2023  3:16 PM Ja-Kwan M wrote: Red Word that prompted transfer to Nurse Triage: extreme pain in hip not able to lift leg   Chief Complaint: Right hip pain Symptoms: pain on flexion of the right hip Frequency: since Feb 10th Pertinent Negatives: Patient denies recent injuries, falls, back pain, pain shooting down leg,  fever, rash, headache, vision changes numbness/tingling,  Disposition: [] ED /[] Urgent Care (no appt availability in office) / [x] Appointment(In office/virtual)/ []  [facility]/ [] Home Care/ [] Refused Recommended Disposition /[]  Mobile Bus/ []  Follow-up with PCP Additional Notes: Patient called and advised that she has been having issues with her right hip. Feb 10th--patient states that she had a stroke and she states that her right side is weaker than left at baseline since the stroke. She denies any recent injuries, falls, back pain, pain shooting down the leg, fever, rash, headache, vision changes, or numbness/tingling. Patient states that she saw her physical therapist about ten minutes prior to calling and speaking with this triage nurse.   Patient states that her right hand grip/strength is getting better since the stroke. She denies any numbness/tingling in her right hip or leg. 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 Patient's PCP wants patient to take Tylenol and do exercises with physical therapy first. Patient states that the pain only happens when she flexes her hip to bring her leg up to her. Patient states that physical therapy has her using a walker because her hip is too weak to be able to not use it. Physical therapy told the patient that some stronger pain medication could possibly  be beneficial so patient can do the physical therapy exercises. She states that she has been taking two extra strength tylenol in the morning and two at night and things still haven't gotten a lot better as far as this pain on flexion with that right hip. Appointment is made for Friday 04/05/2023 at 3:40 pm at patient's PCP office with [facility]. Patient is also advised that if anything gets worse to go to the Emergency Room for immediate medical attention.  Patient verbalized understanding.    Reason for Disposition  [1] MODERATE pain (e.g., interferes with normal activities, limping) AND [2] present > 3 days  Answer Assessment - Initial Assessment Questions 1. LOCATION and RADIATION: "Where is the pain located?"      Right hip 2. QUALITY: "What does the pain feel like?"  (e.g., sharp, dull, aching, burning)     Sharp stabbing and when she puts weight on it, it goes away. 3. SEVERITY: "How bad is the pain?" "What does it keep you from doing?"   (Scale 1-10; or mild, moderate, severe)   -  MILD (1-3): doesn't interfere with normal activities    -  MODERATE (4-7): interferes with normal activities (e.g., work or school) or awakens from sleep, limping    -  SEVERE (8-10): excruciating pain, unable to do any normal activities, unable to walk     Zero at the moment but if she moves a certain way or coughs she will get an immediate 9-10 pain in this right hip 4. ONSET: "When did the pain start?" "Does it come and go, or is  it there all the time?"     Since Feb 10th---off and on 5. WORK OR EXERCISE: "Has there been any recent work or exercise that involved this part of the body?"      no 6. CAUSE: "What do you think is causing the hip pain?"      unsure 7. AGGRAVATING FACTORS: "What makes the hip pain worse?" (e.g., walking, climbing stairs, running)     Certain movements--pain goes away when patient stands or walks on it 8. OTHER SYMPTOMS: "Do you have any other symptoms?" (e.g., back pain,  pain shooting down leg,  fever, rash)     No  Protocols used: Hip Pain-A-AH

## 2023-04-05 ENCOUNTER — Encounter (HOSPITAL_COMMUNITY): Payer: Self-pay | Admitting: Interventional Radiology

## 2023-04-05 ENCOUNTER — Encounter: Payer: Self-pay | Admitting: Family Medicine

## 2023-04-05 ENCOUNTER — Ambulatory Visit: Admitting: Family Medicine

## 2023-04-05 ENCOUNTER — Ambulatory Visit (INDEPENDENT_AMBULATORY_CARE_PROVIDER_SITE_OTHER): Admitting: Family Medicine

## 2023-04-05 ENCOUNTER — Other Ambulatory Visit: Payer: Self-pay

## 2023-04-05 VITALS — BP 102/80 | HR 91 | Temp 97.6°F | Resp 16 | Ht 65.0 in | Wt 189.9 lb

## 2023-04-05 DIAGNOSIS — Z96641 Presence of right artificial hip joint: Secondary | ICD-10-CM | POA: Diagnosis not present

## 2023-04-05 DIAGNOSIS — M25551 Pain in right hip: Secondary | ICD-10-CM | POA: Diagnosis not present

## 2023-04-05 MED ORDER — TRAMADOL HCL 50 MG PO TABS: 50.0000 mg | ORAL_TABLET | Freq: Three times a day (TID) | ORAL | 0 refills | Status: AC | PRN

## 2023-04-05 MED ORDER — PREGABALIN 25 MG PO CAPS: 25.0000 mg | ORAL_CAPSULE | Freq: Two times a day (BID) | ORAL | 1 refills | Status: DC

## 2023-04-05 NOTE — Patient Instructions (Signed)
 Continue tylenol as Dr. Leonard Schwartz advised  You can try lyrica daily medications to see if it limits your pain episodes.  If the pain is still short in severe episodes and you cannot change your positions or adjust to aleviate the pain I have sent in tramadol as a rescue medication for you to try.  You may also take before bedtime or therapy to help you get through the night or therapy with LESS pain.

## 2023-04-05 NOTE — Progress Notes (Signed)
 Patient ID: Natasha Vasquez, female    DOB: 04-16-1940, 83 y.o.   MRN: [id number]  PCP: Erasmo Downer, MD  Chief Complaint  Patient presents with   Hip Pain    Right hip pain only with flexion since 02/25/2023. Patient unable to adequately complete physical therapy exercises. Reports right side is weaker than left at baseline since the stroke.    Subjective:   Natasha Vasquez is a 83 y.o. female, presents to clinic with CC of the following:  HPI  Here for acute hip pain, however pts pain is not acutely new, she has hx of partial hip replacement and she's had pain since 2/10 ED visit and stroke Hip pain severe and sudden and comes and goes To hip and groin, does not radiate down  She has seen her PCP and orthopedist since the onset of the pain, pcp advised managing with extra strength tylenol, ortho did injection, xrays and recommended surgery when able to later after stroke.  Narrative & Impression  CLINICAL DATA:  83 year old female with history of right-sided hip pain.   EXAM: DG HIP (WITH OR WITHOUT PELVIS) 1V RIGHT   COMPARISON:  No priors.   FINDINGS: AP view of the bony pelvis and AP view of the right hip demonstrate no definite acute displaced fracture of the bony pelvic ring. Bilateral proximal femurs as visualized appear intact. Patient is status post right hip hemiarthroplasty. Prosthetic femoral head projects within the acetabulum on this single view examination. No definite periprosthetic fracture or other acute abnormality. Iodinated contrast material is noted within the lumen of the urinary bladder, presumably related to recent contrast-enhanced CT examinations.   IMPRESSION: 1. No acute radiographic abnormality of the bony pelvis or the right hip. 2. Status post right hip hemiarthroplasty.     Electronically Signed   By: Trudie Reed M.D.   On: 02/26/2023 06:12    Large Joint Injection: R greater trochanteric bursa  Date/Time:  03/13/2023 11:14 AM  Performed by: Patience Musca, PA Authorized by: Patience Musca, PA  Consent Given by: Patient Indications: Pain Needle Size: 25 G Guidance: ultrasound  Location: Hip Site: R greater trochanteric bursa Topical skin anesthesia: obtained using ethyl chloride spray  Medications: 3 mL BUPivacaine HCl 0.5 %; 3 mg lidocaine 1 %; 40 mg triamcinolone acetonide 40 mg/mL Patient tolerance: Patient tolerated the procedure well        Patient Active Problem List   Diagnosis Date Noted   History of cerebrovascular accident (CVA) with residual deficit 03/15/2023   ICAO (internal carotid artery occlusion), left 02/26/2023   Brainstem infarct, acute (HCC) 02/26/2023   Right sided weakness 02/25/2023   CVA (cerebrovascular accident) (HCC) 02/25/2023   Moderate episode of recurrent major depressive disorder (HCC) 02/11/2023   Sciatic pain, left 09/27/2021   SOB (shortness of breath) 05/16/2021   Aneurysm of ascending aorta without rupture (HCC) 05/10/2021   HLD (hyperlipidemia) 05/04/2021   Elevated troponin 05/04/2021   Chronic diastolic CHF (congestive heart failure) (HCC) 05/04/2021   Recurrent major depressive disorder, in partial remission (HCC) 03/24/2021   Gout 03/24/2021   PAD (peripheral artery disease) (HCC) 06/24/2020   Iron deficiency 10/07/2018   Primary osteoarthritis of left knee 09/17/2018   Emphysema lung (HCC) 03/14/2018   Diabetic peripheral neuropathy associated with type 2 diabetes mellitus (HCC) 03/14/2018   Metatarsalgia of both feet 03/14/2018   Abnormality of lung 03/14/2018   Acquired absence of right breast 06/11/2017   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) 05/09/2017   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   Dysphagia 03/11/2017   Obesity 03/11/2017   Subclinical hypothyroidism 06/08/2015   Osteopenia 06/08/2015   Billowing mitral valve 06/08/2015   Melanoma in situ  (HCC) 06/08/2015   Cramps of lower extremity 06/08/2015   Type 2 diabetes mellitus (HCC) 05/18/2015   Chronic diarrhea 05/18/2015   Anemia 05/18/2015   Clinical depression 01/26/2015   Hyperlipidemia associated with type 2 diabetes mellitus (HCC) 10/28/2014   Hypertension associated with diabetes (HCC) 09/27/2014   GERD (gastroesophageal reflux disease) 08/06/2014   Overactive bladder 07/16/2014      Current Outpatient Medications:    acetaminophen (TYLENOL) 500 MG tablet, Take 500 mg by mouth every 6 (six) hours as needed for moderate pain (pain score 4-6)., Disp: , Rfl:    albuterol (VENTOLIN HFA) 108 (90 Base) MCG/ACT inhaler, TAKE 2 PUFFS BY MOUTH EVERY 6 HOURS AS NEEDED FOR WHEEZE OR SHORTNESS OF BREATH, Disp: 8.5 each, Rfl: 2   allopurinol (ZYLOPRIM) 100 MG tablet, TAKE 2 TABLETS BY MOUTH EVERY DAY, Disp: 180 tablet, Rfl: 1   ARIPiprazole (ABILIFY) 2 MG tablet, TAKE 1 TABLET BY MOUTH EVERY DAY, Disp: 90 tablet, Rfl: 1   aspirin EC 81 MG tablet, Take 81 mg by mouth daily. Swallow whole., Disp: , Rfl:    bismuth subsalicylate (PEPTO BISMOL) 262 MG chewable tablet, Chew 262 mg by mouth as needed for indigestion or diarrhea or loose stools. **capsule**, Disp: , Rfl:    calcium carbonate (TUMS - DOSED IN MG ELEMENTAL CALCIUM) 500 MG chewable tablet, Chew 1 tablet by mouth as needed for indigestion or heartburn., Disp: , Rfl:    carboxymethylcellulose (REFRESH PLUS) 0.5 % SOLN, Place 1 drop into both eyes 3 (three) times daily as needed (Dry Eyes)., Disp: , Rfl:    carvedilol (COREG) 6.25 MG tablet, Take 1 tablet (6.25 mg total) by mouth 2 (two) times daily., Disp: 180 tablet, Rfl: 3   Cholecalciferol (VITAMIN D-3 PO), Take 1 tablet by mouth daily., Disp: , Rfl:    clopidogrel (PLAVIX) 75 MG tablet, Take 1 tablet (75 mg total) by mouth daily., Disp: 90 tablet, Rfl: 1   colestipol (COLESTID) 1 g tablet, TAKE 1 TABLET BY MOUTH 2 TIMES DAILY., Disp: 180 tablet, Rfl: 5   famotidine (PEPCID) 20  MG tablet, TAKE 1 TABLET BY MOUTH TWICE A DAY, Disp: 180 tablet, Rfl: 0   ferrous sulfate 325 (65 FE) MG tablet, Take 325 mg by mouth daily with breakfast., Disp: , Rfl:    fluocinonide (LIDEX) 0.05 % external solution, Apply 1 Application topically daily as needed (scalp)., Disp: , Rfl:    furosemide (LASIX) 20 MG tablet, TAKE 1 TABLET BY MOUTH EVERY DAY, Disp: 90 tablet, Rfl: 3   lisinopril (ZESTRIL) 10 MG tablet, Take 1 tablet (10 mg total) by mouth daily., Disp: 90 tablet, Rfl: 1   Multiple Vitamins-Minerals (CENTRUM SILVER PO), Take 1 tablet by mouth daily., Disp: , Rfl:    oxybutynin (DITROPAN-XL) 10 MG 24 hr tablet, TAKE 1 TABLET BY MOUTH EVERY DAY, Disp: 90 tablet, Rfl: 1   pantoprazole (PROTONIX) 40 MG tablet, Take 1 tablet (40 mg total) by mouth 2 (two) times daily before a meal., Disp: 180 tablet, Rfl: 1   potassium chloride (KLOR-CON) 10 MEQ tablet, TAKE 1 TABLET BY MOUTH EVERY DAY, Disp: 90 tablet, Rfl: 3   rosuvastatin (CRESTOR) 20 MG tablet, Take 1 tablet (20 mg total)  by mouth daily., Disp: 90 tablet, Rfl: 1   tirzepatide (MOUNJARO) 5 MG/0.5ML Pen, Inject 5 mg into the skin once a week., Disp: , Rfl:    Venlafaxine HCl 225 MG TB24, TAKE 1 TABLET (225 MG TOTAL) BY MOUTH DAILY. PLEASE SCHEDULE OFFICE VISIT BEFORE ANY FUTURE REFILLS, Disp: 90 tablet, Rfl: 0   Allergies  Allergen Reactions   Codeine Other (See Comments)    Chest pain   Ciprofloxacin     Can not take    Quinolones     PATIENT UNAWARE OF allergy to this class of antibiotics, only aware of allergy to [facility] Patient was warned about not using Cipro and similar antibiotics. Recent studies have raised concern that fluoroquinolone antibiotics could be associated with an increased risk of aortic aneurysm Fluoroquinolones have non-antimicrobial properties that might jeopardise the integrity of the extracellular matrix of the vascular wall In a  propensity score matched cohort study in [place], there was a 66% increased rate    Aleve [Naproxen Sodium] Hives, Rash and Other (See Comments)    headaches   Lorazepam Other (See Comments)    "Feels loopy"   Oxycodone Other (See Comments)    MENTAL CHANGES   Paregoric Other (See Comments)    Chest pain---tolerated MORPHINE   Penicillins Hives    Can take Augmentin     Social History   Tobacco Use   Smoking status: Former    Current packs/day: 0.00    Types: Cigarettes    Start date: 1963    Quit date: 1965    Years since quitting: 60.2   Smokeless tobacco: Never   Tobacco comments:    smoked in college--1 pack would last one week.  Vaping Use   Vaping status: Never Used  Substance Use Topics   Alcohol use: Not Currently    Alcohol/week: 7.0 - 14.0 standard drinks of alcohol    Types: 7 - 14 Glasses of wine per week    Comment: 1-2 glasses of wine per night   Drug use: No      Chart Review Today: I personally reviewed active problem list, medication list, allergies, family history, social history, health maintenance, notes from last encounter, lab results, imaging with the patient/caregiver today.   Review of Systems  Constitutional: Negative.   HENT: Negative.    Eyes: Negative.   Respiratory: Negative.    Cardiovascular: Negative.   Gastrointestinal: Negative.   Endocrine: Negative.   Genitourinary: Negative.   Musculoskeletal: Negative.   Skin: Negative.   Allergic/Immunologic: Negative.   Neurological: Negative.   Hematological: Negative.   Psychiatric/Behavioral: Negative.    All other systems reviewed and are negative.      Objective:   Vitals:   04/05/23 1536 04/05/23 1543  BP: 120/80 102/80  Pulse: 91   Resp: 16   Temp: 97.6 F (36.4 C)   TempSrc: Oral   SpO2: 95%   Weight: 189 lb 14.4 oz (86.1 kg)   Height: 5\' 5"  (1.651 m)     Body mass index is 31.6 kg/m.  Physical Exam Vitals and nursing note reviewed.  Constitutional:      General: She is not in acute distress.    Appearance: Normal appearance. She is  well-developed. She is obese. She is not ill-appearing, toxic-appearing or diaphoretic.  HENT:     Head: Normocephalic and atraumatic.     Nose: Nose normal.  Eyes:     General:        Right eye: No discharge.  Left eye: No discharge.     Conjunctiva/sclera: Conjunctivae normal.  Neck:     Trachea: No tracheal deviation.  Cardiovascular:     Rate and Rhythm: Normal rate.  Pulmonary:     Effort: Pulmonary effort is normal. No respiratory distress.  Musculoskeletal:     Comments: In seated position pt can rotate externally and internally right hip Limited flexion Antalgic gait using walker  Skin:    General: Skin is warm and dry.     Findings: No rash.  Neurological:     Mental Status: She is alert.     Motor: No abnormal muscle tone.     Coordination: Coordination normal.     Gait: Gait abnormal.  Psychiatric:        Behavior: Behavior normal.      Results for orders placed or performed in visit on 04/01/23  Platelet inhibition p2y12 (not at [facility])   Collection Time: 04/01/23 11:01 AM  Result Value Ref Range   Platelet Function  P2Y12 114 (L) 182 - 335 PRU       Assessment & Plan:   Pt here for acute OV for hip pain, however pain is not new and pt has been seen by her orthopedist and PCP She is trying to do oupt PT/OT for recent cerebellar stroke and tx is limited by right hip pain that has been worse since around the same time as stroke She was scheduled here for help with pain due to PCP being out of office She saw ortho recently and their plan was to see how hip injection helped and then add PT for hip until able to do surgery - encouraged her to f/up with ortho - msg also sent to ortho Reviewed meds that she can try for pain - but unfortunately pain seem very intermittent, sharp, severe and brief - lyrica may be of limited benefit - but she can try it to see if it might limit episodes or severity of pain If any episodes are not relieved by position changes than  I have offered tramadol which she has taken before and tolerated - reviewed SE/Risk with controlled substance pain meds, and limitation by DEA/STOP act to 5 days for acute pain - she will need to f/up with ortho or pcp for further management.  She can continue to take tylenol as directed by PCP  All med options and recommendations reviewed with Pt and her husband - they both did seem to still be confused why they made the appt today, but than also knew the appt was for help with pain. After all med options reviewed the pts husband said they don't want any more meds and I again explained the meds and what they may help with, or at least she can get the tramadol and have it on hand in case of severe pain flares. Strongly recommended f/up with Ortho about the next tx options and pt did not want to do PT because she is already doing PT - I explained the ortho PT would be specifically for her hip and likely through [facility] to help with ROM and pain and it is the only other option other than the meds or just living with the pain until she can do surgery.    ICD-10-CM   1. Pain of right hip  M25.551 pregabalin (LYRICA) 25 MG capsule    traMADol (ULTRAM) 50 MG tablet    2. History of right hip hemiarthroplasty  [phone]  Return for f/up with [facility] about continued hip pain management and PT.      Danelle Berry, PA-C 04/05/23 1:29 PM

## 2023-04-07 NOTE — H&P (Signed)
   The note originally documented on this encounter has been moved the the encounter in which it belongs.

## 2023-04-07 NOTE — Anesthesia Preprocedure Evaluation (Signed)
 Anesthesia Evaluation  Patient identified by MRN, date of birth, ID band Patient awake    Reviewed: Allergy & Precautions, NPO status , Patient's Chart, lab work & pertinent test results  History of Anesthesia Complications Negative for: history of anesthetic complications  Airway Mallampati: II  TM Distance: >3 FB Neck ROM: Full    Dental no notable dental hx. (+) Teeth Intact, Dental Advisory Given   Pulmonary sleep apnea and Continuous Positive Airway Pressure Ventilation , COPD, former smoker   Pulmonary exam normal breath sounds clear to auscultation       Cardiovascular hypertension, + Peripheral Vascular Disease  Normal cardiovascular exam Rhythm:Regular Rate:Normal  02/26/2023 TTE  1. Left ventricular ejection fraction, by estimation, is 55 to 60%. The  left ventricle has normal function. The left ventricle has no regional  wall motion abnormalities. There is mild asymmetric left ventricular  hypertrophy of the septal segment. Left  ventricular diastolic parameters are consistent with Grade I diastolic  dysfunction (impaired relaxation).   2. Right ventricular systolic function is normal. The right ventricular  size is normal. There is normal pulmonary artery systolic pressure. The  estimated right ventricular systolic pressure is 30.9 mmHg.   3. The mitral valve is grossly normal. No evidence of mitral valve  regurgitation. No evidence of mitral stenosis.   4. The aortic valve is tricuspid. There is mild calcification of the  aortic valve. There is mild thickening of the aortic valve. Aortic valve  regurgitation is not visualized. Aortic valve sclerosis is present, with  no evidence of aortic valve stenosis.   5. The inferior vena cava is normal in size with greater than 50%  respiratory variability, suggesting right atrial pressure of 3 mmHg.   6. Agitated saline contrast bubble study was negative, with no evidence   of any interatrial shunt.     Neuro/Psych    Depression     Neuromuscular disease CVA (R sided weakness mild aphasia), Residual Symptoms    GI/Hepatic ,GERD  ,,  Endo/Other  diabetesHypothyroidism    Renal/GU Renal diseaseLab Results      Component                Value               Date                      NA                       138                 03/01/2023                CL                       104                 03/01/2023                K                        3.6                 03/01/2023                CO2  24                  03/01/2023                BUN                      16                  03/01/2023                CREATININE               0.88                03/01/2023                     Musculoskeletal  (+) Arthritis ,    Abdominal   Peds  Hematology Lab Results      Component                Value               Date                      WBC                      8.4                 02/27/2023                HGB                      12.5                02/27/2023                HCT                      37.1                02/27/2023                MCV                      87.7                02/27/2023                PLT                      269                 02/27/2023              Anesthesia Other Findings All: See list  Breast CA  Reproductive/Obstetrics                             Anesthesia Physical Anesthesia Plan  ASA: 4  Anesthesia Plan: General   Post-op Pain Management: Tylenol PO (pre-op)*   Induction: Intravenous  PONV Risk Score and Plan: Treatment may vary due to age or medical condition and Ondansetron  Airway Management Planned: Oral ETT  Additional Equipment: Arterial line and None  Intra-op Plan:   Post-operative Plan: Extubation in OR  Informed Consent: I have reviewed the patients History and Physical, chart,  labs and discussed the procedure including the risks,  benefits and alternatives for the proposed anesthesia with the patient or authorized representative who has indicated his/her understanding and acceptance.     Dental advisory given  Plan Discussed with: CRNA and Anesthesiologist  Anesthesia Plan Comments:         Anesthesia Quick Evaluation

## 2023-04-07 NOTE — H&P (Signed)
 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   Referring Provider(s): Established NIR patient  Supervising Physician: Julieanne Cotton  Patient Status: [facility] - Out-pt  History of Present Illness: Natasha Vasquez is a 83 y.o. female status post diagnostic catheter arteriogram on 02/27/2023 for acute onset of right-sided weakness, dysarthria, and mild aphasia.   She was admitted as an acute stroke and underwent a diagnostic catheter arteriogram for notable severe stenosis of the proximal left internal carotid artery on CT angiogram of the head and neck.  The diagnostic arteriogram revealed a severe pre occlusive stenosis of the proximal left ICA.   MRI of the brain demonstrated also a small left midbrain cerebral peduncle diffusion-weighted abnormality compatible with a stroke.   She is here today for cerebral intervention.  She is NPO. No nausea/vomiting. No Fever/chills. ROS negative.   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  ER/PR NEGATIVE   Cataract    Chronic cough    Complex sleep apnea syndrome    AVAPS   Complication of anesthesia    20 years ago pts b/p dropped really low, "coded"   Depression    Depression    Diabetes mellitus without complication (HCC)    diet controlled   Family history of adverse reaction to anesthesia    son PONV   GERD (gastroesophageal reflux disease)    Gout    Hypercholesteremia    Hyperlipidemia    Hypertension    Melanoma (HCC)    MVP (mitral valve prolapse)    followed by PCP   Nephritis    Nephritis    S/P appendectomy    Stroke [facility])    Thoracic aortic aneurysm (HCC)    Torn meniscus    left    Past Surgical History:  Procedure Laterality Date   ABDOMINAL HYSTERECTOMY     APPENDECTOMY     BREAST  BIOPSY Right 04/25/2017   retroareolar 10:00   wing clip   path pending   BREAST BIOPSY Right 04/25/2017   10:00  5CMFN  venus clip    path pending   BREAST BIOPSY Right 05/03/2017   Affirm Bx- path pending   BREAST CYST ASPIRATION Bilateral    NEG   BREAST EXCISIONAL BIOPSY Left 20+ yrs ago   NEG   BREAST RECONSTRUCTION WITH PLACEMENT OF TISSUE EXPANDER AND FLEX HD (ACELLULAR HYDRATED DERMIS) Right 06/03/2017   Procedure: BREAST RECONSTRUCTION WITH PLACEMENT OF TISSUE EXPANDER AND FLEX HD (ACELLULAR HYDRATED DERMIS);  Surgeon: Peggye Form, DO;  Location: [facility] ORS;  Service: Plastics;  Laterality: Right;   BREAST REDUCTION SURGERY Left 11/06/2017   Procedure: BREAST REDUCTION;  Surgeon: Peggye Form, DO;  Location: [facility] OR[facility];  Service: Plastics;  Laterality: Left;   BROW LIFT Bilateral 02/01/2015   Procedure: BLEPHAROPLASTY bilateral upper eyelids.;  Surgeon: Imagene Riches, MD;  Location: [facility];  Service: Ophthalmology;  Laterality: Bilateral;  DIABETIC - diet controlled   CATARACT EXTRACTION W/PHACO Left 01/14/2019   Procedure: CATARACT EXTRACTION PHACO AND INTRAOCULAR LENS PLACEMENT (IOC) LEFT TECNIS TORIC ADD 5.62 00:46.5 30.0%;  Surgeon: Lockie Mola, MD;  Location: [facility];  Service: Ophthalmology;  Laterality: Left;  diabetic - diet controlled   CATARACT  EXTRACTION W/PHACO Right 02/11/2019   Procedure: CATARACT EXTRACTION PHACO AND INTRAOCULAR LENS PLACEMENT (IOC) RIGHT DIABETIC 4.82 00:46.9 10.3%;  Surgeon: Lockie Mola, MD;  Location: [facility];  Service: Ophthalmology;  Laterality: Right;  Diabetic - diet controlled   CHOLECYSTECTOMY     COLONOSCOPY     COLONOSCOPY WITH PROPOFOL N/A 05/11/2015   Procedure: COLONOSCOPY WITH PROPOFOL;  Surgeon: Scot Jun, MD;  Location: [facility];  Service: Endoscopy;  Laterality: N/A;   COLONOSCOPY WITH PROPOFOL N/A 09/07/2020   Procedure: COLONOSCOPY WITH PROPOFOL;   Surgeon: Toledo, Boykin Nearing, MD;  Location: [facility];  Service: Gastroenterology;  Laterality: N/A;   ESOPHAGOGASTRODUODENOSCOPY     ESOPHAGOGASTRODUODENOSCOPY (EGD) WITH PROPOFOL  05/11/2015   Procedure: ESOPHAGOGASTRODUODENOSCOPY (EGD) WITH PROPOFOL;  Surgeon: Scot Jun, MD;  Location: [facility] END[facility];  Service: Endoscopy;;   EYE SURGERY Bilateral    cataracts   FRACTURE SURGERY Right    arm and shoulder   IR ANGIO INTRA EXTRACRAN SEL COM CAROTID INNOMINATE BILAT MOD SED  02/27/2023   IR ANGIO VERTEBRAL SEL VERTEBRAL UNI R MOD SED  02/27/2023   IR RADIOLOGIST EVAL & MGMT  03/15/2023   IR US GUIDE VASC ACCESS RIGHT  02/27/2023   JOINT REPLACEMENT     KNEE ARTHROSCOPY Left 03/14/2016   Procedure: ARTHROSCOPY KNEE, PARTIAL MEDIAL MENISECTOMY, CHONDROPLASTY;  Surgeon: Donato Heinz, MD;  Location: [facility];  Service: Orthopedics;  Laterality: Left;   MASTECTOMY Right 2019   MASTECTOMY W/ SENTINEL NODE BIOPSY Right 06/03/2017   Procedure: MASTECTOMY WITH SENTINEL LYMPH NODE BIOPSY;  Surgeon: Earline Mayotte, MD;  Location: [facility];  Service: General;  Laterality: Right;   PARTIAL HIP ARTHROPLASTY Right    REDUCTION MAMMAPLASTY Left 2019   REMOVAL OF BILATERAL TISSUE EXPANDERS WITH PLACEMENT OF BILATERAL BREAST IMPLANTS Right 11/06/2017   Procedure: REMOVAL OF TISSUE EXPANDER WITH PLACEMENT OF BREAST IMPLANT;  Surgeon: Peggye Form, DO;  Location: [facility];  Service: Plastics;  Laterality: Right;   TONSILLECTOMY      Allergies: Codeine, Ciprofloxacin, Quinolones, Aleve [naproxen sodium], Lorazepam, Oxycodone, Paregoric, and Penicillins  Medications: Prior to Admission medications   Medication Sig Start Date End Date Taking? Authorizing Provider  acetaminophen (TYLENOL) 500 MG tablet Take 500 mg by mouth every 6 (six) hours as needed for moderate pain (pain score 4-6).    [provider]  albuterol (VENTOLIN HFA) 108 (90 Base) MCG/ACT inhaler TAKE 2 PUFFS  BY MOUTH EVERY 6 HOURS AS NEEDED FOR WHEEZE OR SHORTNESS OF BREATH 05/29/21   Salena Saner, MD  allopurinol (ZYLOPRIM) 100 MG tablet TAKE 2 TABLETS BY MOUTH EVERY DAY 10/25/22   Erasmo Downer, MD  ARIPiprazole (ABILIFY) 2 MG tablet TAKE 1 TABLET BY MOUTH EVERY DAY 03/01/23   Erasmo Downer, MD  aspirin EC 81 MG tablet Take 81 mg by mouth daily. Swallow whole.    [provider]  bismuth subsalicylate (PEPTO BISMOL) 262 MG chewable tablet Chew 262 mg by mouth as needed for indigestion or diarrhea or loose stools. **capsule**    [provider]  calcium carbonate (TUMS - DOSED IN MG ELEMENTAL CALCIUM) 500 MG chewable tablet Chew 1 tablet by mouth as needed for indigestion or heartburn.    [provider]  carboxymethylcellulose (REFRESH PLUS) 0.5 % SOLN Place 1 drop into both eyes 3 (three) times daily as needed (Dry Eyes).    [provider]  carvedilol (COREG) 6.25 MG tablet Take 1 tablet (6.25 mg  total) by mouth 2 (two) times daily. 01/29/23   Carlos Levering, NP  Cholecalciferol (VITAMIN D-3 PO) Take 1 tablet by mouth daily.    [provider]  clopidogrel (PLAVIX) 75 MG tablet Take 1 tablet (75 mg total) by mouth daily. 03/02/23   Rodolph Bong, MD  colestipol (COLESTID) 1 g tablet TAKE 1 TABLET BY MOUTH 2 TIMES DAILY. 10/30/22   Erasmo Downer, MD  famotidine (PEPCID) 20 MG tablet TAKE 1 TABLET BY MOUTH TWICE A DAY 02/26/23   Bacigalupo, Marzella Schlein, MD  ferrous sulfate 325 (65 FE) MG tablet Take 325 mg by mouth daily with breakfast.    [provider]  fluocinonide (LIDEX) 0.05 % external solution Apply 1 Application topically daily as needed (scalp). 10/31/18   [provider]  furosemide (LASIX) 20 MG tablet TAKE 1 TABLET BY MOUTH EVERY DAY 06/04/22   Antonieta Iba, MD  lisinopril (ZESTRIL) 10 MG tablet Take 1 tablet (10 mg total) by mouth daily. 05/01/22   Erasmo Downer, MD  Multiple  Vitamins-Minerals (CENTRUM SILVER PO) Take 1 tablet by mouth daily.    [provider]  oxybutynin (DITROPAN-XL) 10 MG 24 hr tablet TAKE 1 TABLET BY MOUTH EVERY DAY 10/25/22   Bacigalupo, Marzella Schlein, MD  pantoprazole (PROTONIX) 40 MG tablet Take 1 tablet (40 mg total) by mouth 2 (two) times daily before a meal. 03/12/23   Bacigalupo, Marzella Schlein, MD  potassium chloride (KLOR-CON) 10 MEQ tablet TAKE 1 TABLET BY MOUTH EVERY DAY 06/04/22   Antonieta Iba, MD  pregabalin (LYRICA) 25 MG capsule Take 1 capsule (25 mg total) by mouth 2 (two) times daily. 04/05/23   Danelle Berry, PA-C  rosuvastatin (CRESTOR) 20 MG tablet Take 1 tablet (20 mg total) by mouth daily. 03/01/23   Rodolph Bong, MD  tirzepatide [facility]) 5 MG/0.5ML Pen Inject 5 mg into the skin once a week.    [provider]  traMADol (ULTRAM) 50 MG tablet Take 1 tablet (50 mg total) by mouth every 8 (eight) hours as needed for up to 5 days for severe pain (pain score 7-10). 04/05/23 04/10/23  Danelle Berry, PA-C  Venlafaxine HCl 225 MG TB24 TAKE 1 TABLET (225 MG TOTAL) BY MOUTH DAILY. PLEASE SCHEDULE OFFICE VISIT BEFORE ANY FUTURE REFILLS 02/26/23   Erasmo Downer, MD     Family History  Problem Relation Age of Onset   Cancer Mother        colon/rectal   Colon cancer Mother    Heart disease Father    Hypertension Father    Atrial fibrillation Father    Diabetes Father    Cancer Brother        lung   Healthy Son    Healthy Son    Breast cancer Neg Hx     Social History   Socioeconomic History   Marital status: Married    Spouse name: Dorene Sorrow   Number of children: 2   Years of education: Not on file   Highest education level: Master's degree (e.g., MA, MS, MEng, MEd, MSW, MBA)  Occupational History   Occupation: retired  Tobacco Use   Smoking status: Former    Current packs/day: 0.00    Types: Cigarettes    Start date: 1963    Quit date: 1965    Years since quitting: 60.2   Smokeless tobacco: Never    Tobacco comments:    smoked in college--1 pack would last one week.  Vaping  Use   Vaping status: Never Used  Substance and Sexual Activity   Alcohol use: Not Currently    Alcohol/week: 7.0 - 14.0 standard drinks of alcohol    Types: 7 - 14 Glasses of wine per week    Comment: 1-2 glasses of wine per night   Drug use: No   Sexual activity: Never  Other Topics Concern   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    Social Drivers of Corporate investment banker Strain: Low Risk  (04/23/2022)   Overall Financial Resource Strain (CARDIA)    Difficulty of Paying Living Expenses: Not hard at all  Food Insecurity: No Food Insecurity (03/18/2023)   Hunger Vital Sign    Worried About Running Out of Food in the Last Year: Never true    Ran Out of Food in the Last Year: Never true  Transportation Needs: No Transportation Needs (03/18/2023)   PRAPARE - Administrator, Civil Service (Medical): No    Lack of Transportation (Non-Medical): No  Physical Activity: Sufficiently Active (04/23/2022)   Exercise Vital Sign    Days of Exercise per Week: 5 days    Minutes of Exercise per Session: 40 min  Stress: No Stress Concern Present (04/23/2022)   [employer] of Occupational Health - Occupational Stress Questionnaire    Feeling of Stress : Not at all  Social Connections: Socially Integrated (02/26/2023)   Social Connection and Isolation Panel [NHANES]    Frequency of Communication with Friends and Family: More than three times a week    Frequency of Social Gatherings with Friends and Family: More than three times a week    Attends Religious Services: More than 4 times per year    Active Member of [employer] or Organizations: Yes    Attends Engineer, structural: More than 4 times per year    Marital Status: Married     Review of Systems: A 12 point ROS discussed and pertinent positives are  indicated in the HPI above.  All other systems are negative.  Review of Systems  Vital Signs: There were no vitals taken for this visit.  Advance Care Plan: The advanced care place/surrogate decision maker was discussed at the time of visit and the patient did not wish to discuss or was not able to name a surrogate decision maker or provide an advance care plan.  Physical Exam Vitals reviewed.  Constitutional:      Appearance: Normal appearance.  HENT:     Head: Normocephalic and atraumatic.  Eyes:     Extraocular Movements: Extraocular movements intact.  Cardiovascular:     Rate and Rhythm: Normal rate and regular rhythm.  Pulmonary:     Effort: Pulmonary effort is normal. No respiratory distress.     Breath sounds: Normal breath sounds.  Abdominal:     Palpations: Abdomen is soft.  Musculoskeletal:        General: Normal range of motion.     Cervical back: Normal range of motion.  Skin:    General: Skin is warm and dry.  Neurological:     General: No focal deficit present.     Mental Status: She is alert and oriented to person, place, and time.  Psychiatric:        Mood and Affect: Mood normal.        Behavior: Behavior normal.  Thought Content: Thought content normal.        Judgment: Judgment normal.     Imaging: IR Radiologist Eval & Mgmt Result Date: 03/15/2023 EXAM: ESTABLISHED PATIENT OFFICE VISIT CHIEF COMPLAINT: Postprocedural follow-up. Current Pain Level: 1-10 HISTORY OF PRESENT ILLNESS: The patient is an 83 year old right handed lady who is status post diagnostic catheter arteriogram on 02/27/2023 for acute onset of right-sided weakness, dysarthria, and mild aphasia. She was admitted as an acute stroke and underwent a diagnostic catheter arteriogram for notable severe stenosis of the proximal left internal carotid artery on CT angiogram of the head and neck. The diagnostic arteriogram revealed a severe pre occlusive stenosis of the proximal left ICA. The  patient then underwent improvement with PT OT and speech therapy. MRI of the brain demonstrated also a small left midbrain cerebral peduncle diffusion-weighted abnormality compatible with a stroke. Patient was also evaluated by neurology. Patient was subsequently discharged home under the care of her husband. She returns today in follow-up to discuss management of the noted severe pre occlusive stenosis of the left internal carotid artery proximally. According to the patient she has returned almost back to her normal neurological status except for minimal right-sided weakness and mild decrease in her fine motor movements. Speech is almost back to normal. She continues to have mild-to-moderate difficulty with liquids which comes with a stroke. She notes that her PT OT has been somewhat put on hold due to severe right hip pain which is almost constant as an achy pain in the region of the right groin. Acute sharp exacerbations are precipitated by twisting and turning in bed in certain positions. During these spells the patient unable to bear weight due to muscular spasm in right groin and hip pain. The patient has been evaluated by the Orthopedic [facility] at [facility]. As per patient x-rays performed there did not reveal any acute fractures. However, has been told that she may need revision of her previous right hip arthroplasty. Patient denies any neurological symptoms of worsening speech, worsening weakness or of incoordination, or visual symptoms. The patient denies any chest pain, shortness of breath or palpitations. Patient reports no difficulty with wheezing, coughing, or sputum production or of chest pain. Appetite is normal.  Weight is steady. Denies any abdominal pain, constipation, diarrhea or melena. No dysuria, hematuria or polyuria. No recent chills, fever or rigors. Medications: albuterol 108 (90 Base) MCG/ACT inhaler commonly known as: VENTOLIN HFA TAKE 2 PUFFS BY MOUTH EVERY 6 HOURS AS NEEDED FOR WHEEZE OR  SHORTNESS OF BREATH allopurinol 100 MG tablet commonly known as: ZYLOPRIM TAKE 2 TABLETS BY MOUTH EVERY DAY ARIPiprazole 2 MG tablet commonly known as: ABILIFY TAKE 1 TABLET BY MOUTH EVERY DAY aspirin 81 MG tablet take 1 tablet (81 mg total) by mouth daily for 21 days. bismuth subsalicylate 262 MG chewable tablet commonly known as: PEPTO BISMOL chew 262 mg by mouth as needed. **capsule** calcium carbonate 500 MG chewable tablet commonly known as: TUMS - dosed in mg elemental calciumChew 1 tablet by mouth as needed for indigestion or heartburn. carboxymethylcellulose 0.5 % Soln commonly known as: REFRESH PLUS place 1 drop into both eyes 3 (three) times daily as needed (Dry Eyes). carvedilol 6.25 MG tablet commonly known as: COREG take 1 tablet (6.25 mg total) by mouth 2 (two) times daily. CENTRUM SILVER PO take 1 tablet by mouth daily. clopidogrel 75 MG tablet commonly known as: PLAVIX take 1 tablet (75 mg total) by mouth daily. Start taking on: March 02, 2023 colestipol  1 g tablet commonly known as: COLESTID TAKE 1 TABLET BY MOUTH 2 TIMES DAILY. famotidine 20 MG tablet commonly known as: PEPCID TAKE 1 TABLET BY MOUTH TWICE A DAY ferrous sulfate 324 MG Tbec take 324 mg by mouth daily with breakfast. fluocinonide 0.05 % external solution commonly known as: LIDEX APPLY TO AFFECTED AREAS OF SCALP DAILY UNTIL CLEAR, THEN FOR FLARES lisinopril 10 MG tablet commonly known as: ZESTRIL take 1 tablet (10 mg total) by mouth daily. omeprazole 40 MG capsule commonly known as: PRILOSEC TAKE 1 CAPSULE BY MOUTH TWICE A DAY oxybutynin 10 MG 24 hr tablet commonly known as: DITROPAN-XL TAKE 1 TABLET BY MOUTH EVERY DAY potassium chloride 10 MEQ tablet commonly known as: KLOR-CON TAKE 1 TABLET BY MOUTH EVERY DAY rosuvastatin 20 MG tablet commonly known as: CRESTOR take 1 tablet (20 mg total) by mouth daily. What changed: medication strengthhow much to take tirzepatide 2.5 MG/0.5ML Pen commonly known as: MOUNJARO inject 2.5 mg into  the skin once a week. What changed: Another medication with the same name was removed. Continue taking this medication, and follow the directions you see here. Trelegy Ellipta 100-62.5-25 MCG/ACT Aepb generic drug: Fluticasone-Umeclidin-Vilant inhale 1 puff into the lungs daily. Venlafaxine HCl 225 MG Tb24 TAKE 1 TABLET (225 MG TOTAL) BY MOUTH DAILY. PLEASE SCHEDULE OFFICE VISIT BEFORE ANY FUTURE REFILLS VITAMIN D-3 PO take 1 tablet by mouth daily. * Codeine: Chest pain * Codeine: Quinolones * Other (See Comments): * Codeine: PATIENT UNAWARE OF allergy to this class of antibiotics, only aware of allergy to [facility] patient was warned about not using Cipro and similar antibiotics. Recent studies have raised concern that fluoroquinolone antibiotics could be associated with an increased risk of aortic aneurysmFluoroquinolones have non-antimicrobial properties that might jeopardise the integrity of the extracellular matrix of the vascular wall in a propensity score matched cohort study in [place], there was a 66% increased rate * Codeine: Aleve naproxen Sodium * Other (See Comments): Hives, Rash, Other (See Comments) * Codeine: headaches * Codeine: Lorazepam * Other (See Comments): Other (See Comments) * Codeine: "Feels loopy" * Codeine: Oxycodone * Other (See Comments): Other (See Comments) * Codeine: MENTAL CHANGES * Codeine: Paregoric * Other (See Comments): Other (See Comments) * Codeine: Chest pain---tolerated MORPHINE * Codeine: Penicillins * Other (See Comments): Hives * Codeine: Can take Augmentin REVIEW OF SYSTEMS: Negative unless as mentioned above. PHYSICAL EXAMINATION: The patient is alert, awake, oriented to time, place, space. Speech and comprehension intact. Pupils are 3 mm bilaterally, reactive to light directly and consensually . Mild right facial droop.  Tongue midline. Has a mild right upper extremity pronation drift. Power 4+/5 in the right upper and lower extremities. Finger-to-nose no past-pointing or  zig zagging. Station and gait: Patient ambulates with a limp using a walker due to right hip pain. ASSESSMENT AND PLAN: Patient's angiogram of 02/27/2023 was reviewed with patient and the spouse. Brought to their attention was the severe pre occlusive stenosis of the left internal carotid artery at the bulb. The procedure of endovascular revascularization was explained in detail. The patient would be under general anesthesia via a transfemoral approach. Following the procedure the patient would spend overnight in the neuro ICU for close neurological and systemic observations. Barring any complications the patient will be discharged home the following day. Risk of 1-2% chance of an ischemic stroke in the territory of the treated vessel was discussed in detail and ways to mitigate that from happening. Patient advised to continue with aspirin 81 mg  a day, and Plavix 75 mg a day in addition to her other prescribed medications. Patient was also advised to maintain adequate hydration. Questions were answered to their satisfaction. We will schedule the endovascular revascularization of the left internal carotid artery proximally with anesthesia as soon as possible. The patient and the spouse expressed understanding and agreement with the above management plan. Electronically Signed   By: Julieanne Cotton M.D.   On: 03/15/2023 17:50    Labs:  CBC: Recent Labs    02/25/23 1113 02/26/23 0317 02/27/23 0334 04/08/23 0610  WBC 7.1 8.9 8.4 7.5  HGB 12.4 12.5 12.5 12.7  HCT 38.4 37.8 37.1 39.4  PLT 301 288 269 271    COAGS: Recent Labs    02/25/23 1113 04/08/23 0803  INR 1.0 1.2  APTT 28  --     BMP: Recent Labs    02/27/23 0334 02/28/23 0349 03/01/23 0348 04/08/23 0610  NA 140 141 138 141  K 3.5 3.7 3.6 3.7  CL 101 106 104 105  CO2 24 24 24 27   GLUCOSE 131* 109* 125* 110*  BUN 17 20 16 20   CALCIUM 9.4 9.1 8.9 9.5  CREATININE 0.85 0.95 0.88 1.20*  GFRNONAA >60 60* >60 45*    LIVER  FUNCTION TESTS: Recent Labs    05/18/22 1610 02/11/23 0945 02/25/23 1113  BILITOT 0.2 0.4 0.6  AST 21 30 34  ALT 21 34* 40  ALKPHOS 102 105 71  PROT 6.8 7.0 7.3  ALBUMIN 4.6 4.7 4.5    TUMOR MARKERS: No results for input(s): "AFPTM", "CEA", "CA199", "CHROMGRNA" in the last 8760 hours.  Assessment and Plan:  Dr. Corliss Skains reviewed angiogram of 02/27/2023 with patient and the spouse.   There is severe pre occlusive stenosis of the left internal carotid artery at the bulb.   Will proceed with cerebral angiography with endovascular revascularization under general anesthesia via a transfemoral approach.   Following the procedure the she will spend overnight in the neuro ICU for close neurological and systemic observations.  Risks and benefits of cerebral angiogram with intervention were discussed with the patient including, but not limited to bleeding, infection, vascular injury, contrast induced renal failure, stroke or even death.  This interventional procedure involves the use of X-rays and because of the nature of the planned procedure, it is possible that we will have prolonged use of X-ray fluoroscopy.  Potential radiation risks to you include (but are not limited to) the following: - A slightly elevated risk for cancer  several years later in life. This risk is typically less than 0.5% percent. This risk is low in comparison to the normal incidence of human cancer, which is 33% for women and 50% for men according to the American Cancer Society. - Radiation induced injury can include skin redness, resembling a rash, tissue breakdown / ulcers and hair loss (which can be temporary or permanent).   The likelihood of either of these occurring depends on the difficulty of the procedure and whether you are sensitive to radiation due to previous procedures, disease, or genetic conditions.   IF your procedure requires a prolonged use of radiation, you will be notified and given  written instructions for further action.  It is your responsibility to monitor the irradiated area for the 2 weeks following the procedure and to notify your physician if you are concerned that you have suffered a radiation induced injury.    All of the patient's questions were answered, patient is agreeable to proceed.  Consent signed and in chart.    Risk of 1-2% chance of an ischemic stroke in the territory of the treated vessel was discussed in detail and ways to mitigate that from happening.   She has been compliant with aspirin 81 mg a day and Plavix 75 mg a day in addition to her other prescribed medications.    Thank you for allowing our service to participate in Natasha Vasquez 's care.  Electronically Signed: Gwynneth Macleod, PA-C   04/08/2023, 8:43 AM      I spent a total of    25 Minutes in face to face in clinical consultation, greater than 50% of which was counseling/coordinating care for cerebral intervention  (A copy of this note was sent to the referring provider and the time of visit.)

## 2023-04-08 ENCOUNTER — Inpatient Hospital Stay (HOSPITAL_COMMUNITY)
Admission: RE | Admit: 2023-04-08 | Discharge: 2023-04-09 | DRG: 035 | Disposition: A | Discharge status: Home / Self Care | Attending: Interventional Radiology | Admitting: Interventional Radiology

## 2023-04-08 ENCOUNTER — Inpatient Hospital Stay (HOSPITAL_COMMUNITY): Payer: Self-pay | Admitting: Physician Assistant

## 2023-04-08 ENCOUNTER — Encounter (HOSPITAL_COMMUNITY)
Admission: RE | Disposition: A | Discharge status: Home / Self Care | Payer: Self-pay | Source: Home / Self Care | Attending: Interventional Radiology

## 2023-04-08 ENCOUNTER — Inpatient Hospital Stay (HOSPITAL_COMMUNITY)
Admission: RE | Admit: 2023-04-08 | Discharge: 2023-04-08 | Disposition: A | Discharge status: Home / Self Care | Source: Ambulatory Visit | Attending: Interventional Radiology | Admitting: Interventional Radiology

## 2023-04-08 ENCOUNTER — Encounter: Payer: Self-pay | Admitting: Family Medicine

## 2023-04-08 DIAGNOSIS — I69351 Hemiplegia and hemiparesis following cerebral infarction affecting right dominant side: Secondary | ICD-10-CM | POA: Diagnosis not present

## 2023-04-08 DIAGNOSIS — K2270 Barrett's esophagus without dysplasia: Secondary | ICD-10-CM | POA: Diagnosis present

## 2023-04-08 DIAGNOSIS — Z90710 Acquired absence of both cervix and uterus: Secondary | ICD-10-CM

## 2023-04-08 DIAGNOSIS — Z96641 Presence of right artificial hip joint: Secondary | ICD-10-CM | POA: Diagnosis present

## 2023-04-08 DIAGNOSIS — Z885 Allergy status to narcotic agent status: Secondary | ICD-10-CM

## 2023-04-08 DIAGNOSIS — Z800 Family history of malignant neoplasm of digestive organs: Secondary | ICD-10-CM

## 2023-04-08 DIAGNOSIS — Z79899 Other long term (current) drug therapy: Secondary | ICD-10-CM

## 2023-04-08 DIAGNOSIS — I69320 Aphasia following cerebral infarction: Secondary | ICD-10-CM

## 2023-04-08 DIAGNOSIS — Z7982 Long term (current) use of aspirin: Secondary | ICD-10-CM

## 2023-04-08 DIAGNOSIS — E7800 Pure hypercholesterolemia, unspecified: Secondary | ICD-10-CM | POA: Diagnosis present

## 2023-04-08 DIAGNOSIS — M19041 Primary osteoarthritis, right hand: Secondary | ICD-10-CM | POA: Diagnosis present

## 2023-04-08 DIAGNOSIS — I10 Essential (primary) hypertension: Secondary | ICD-10-CM | POA: Diagnosis present

## 2023-04-08 DIAGNOSIS — Z1722 Progesterone receptor negative status: Secondary | ICD-10-CM

## 2023-04-08 DIAGNOSIS — Z9011 Acquired absence of right breast and nipple: Secondary | ICD-10-CM

## 2023-04-08 DIAGNOSIS — R4701 Aphasia: Secondary | ICD-10-CM | POA: Diagnosis present

## 2023-04-08 DIAGNOSIS — R053 Chronic cough: Secondary | ICD-10-CM | POA: Diagnosis present

## 2023-04-08 DIAGNOSIS — Z87891 Personal history of nicotine dependence: Secondary | ICD-10-CM

## 2023-04-08 DIAGNOSIS — Z85820 Personal history of malignant melanoma of skin: Secondary | ICD-10-CM

## 2023-04-08 DIAGNOSIS — I771 Stricture of artery: Principal | ICD-10-CM | POA: Diagnosis present

## 2023-04-08 DIAGNOSIS — Z7902 Long term (current) use of antithrombotics/antiplatelets: Secondary | ICD-10-CM | POA: Diagnosis not present

## 2023-04-08 DIAGNOSIS — Z8249 Family history of ischemic heart disease and other diseases of the circulatory system: Secondary | ICD-10-CM | POA: Diagnosis not present

## 2023-04-08 DIAGNOSIS — I341 Nonrheumatic mitral (valve) prolapse: Secondary | ICD-10-CM | POA: Diagnosis present

## 2023-04-08 DIAGNOSIS — Z882 Allergy status to sulfonamides status: Secondary | ICD-10-CM

## 2023-04-08 DIAGNOSIS — K219 Gastro-esophageal reflux disease without esophagitis: Secondary | ICD-10-CM | POA: Diagnosis present

## 2023-04-08 DIAGNOSIS — J449 Chronic obstructive pulmonary disease, unspecified: Secondary | ICD-10-CM | POA: Diagnosis present

## 2023-04-08 DIAGNOSIS — Z833 Family history of diabetes mellitus: Secondary | ICD-10-CM

## 2023-04-08 DIAGNOSIS — I739 Peripheral vascular disease, unspecified: Secondary | ICD-10-CM | POA: Diagnosis present

## 2023-04-08 DIAGNOSIS — G4730 Sleep apnea, unspecified: Secondary | ICD-10-CM | POA: Diagnosis present

## 2023-04-08 DIAGNOSIS — Z171 Estrogen receptor negative status [ER-]: Secondary | ICD-10-CM | POA: Diagnosis not present

## 2023-04-08 DIAGNOSIS — Z880 Allergy status to penicillin: Secondary | ICD-10-CM

## 2023-04-08 DIAGNOSIS — E119 Type 2 diabetes mellitus without complications: Secondary | ICD-10-CM | POA: Diagnosis present

## 2023-04-08 DIAGNOSIS — M19042 Primary osteoarthritis, left hand: Secondary | ICD-10-CM | POA: Diagnosis present

## 2023-04-08 DIAGNOSIS — F32A Depression, unspecified: Secondary | ICD-10-CM | POA: Diagnosis present

## 2023-04-08 DIAGNOSIS — I6522 Occlusion and stenosis of left carotid artery: Secondary | ICD-10-CM

## 2023-04-08 DIAGNOSIS — Z7985 Long-term (current) use of injectable non-insulin antidiabetic drugs: Secondary | ICD-10-CM

## 2023-04-08 DIAGNOSIS — Z888 Allergy status to other drugs, medicaments and biological substances status: Secondary | ICD-10-CM

## 2023-04-08 DIAGNOSIS — Z9882 Breast implant status: Secondary | ICD-10-CM

## 2023-04-08 HISTORY — PX: RADIOLOGY WITH ANESTHESIA: SHX6223

## 2023-04-08 HISTORY — DX: Other sleep apnea: G47.39

## 2023-04-08 HISTORY — PX: IR CT HEAD LTD: IMG2386

## 2023-04-08 HISTORY — PX: IR ANGIO INTRA EXTRACRAN SEL INTERNAL CAROTID UNI L MOD SED: IMG5361

## 2023-04-08 HISTORY — PX: IR INTRA CRAN STENT: IMG2345

## 2023-04-08 HISTORY — DX: Cerebral infarction, unspecified: I63.9

## 2023-04-08 LAB — PROTIME-INR
INR: 1.2 (ref 0.8–1.2)
Prothrombin Time: 15.2 s (ref 11.4–15.2)

## 2023-04-08 LAB — BASIC METABOLIC PANEL
Anion gap: 9 (ref 5–15)
BUN: 20 mg/dL (ref 8–23)
CO2: 27 mmol/L (ref 22–32)
Calcium: 9.5 mg/dL (ref 8.9–10.3)
Chloride: 105 mmol/L (ref 98–111)
Creatinine, Ser: 1.2 mg/dL — ABNORMAL HIGH (ref 0.44–1.00)
GFR, Estimated: 45 mL/min — ABNORMAL LOW (ref >60)
Glucose, Bld: 110 mg/dL — ABNORMAL HIGH (ref 70–99)
Potassium: 3.7 mmol/L (ref 3.5–5.1)
Sodium: 141 mmol/L (ref 135–145)

## 2023-04-08 LAB — CBC
HCT: 39.4 % (ref 36.0–46.0)
Hemoglobin: 12.7 g/dL (ref 12.0–15.0)
MCH: 29.4 pg (ref 26.0–34.0)
MCHC: 32.2 g/dL (ref 30.0–36.0)
MCV: 91.2 fL (ref 80.0–100.0)
Platelets: 271 10*3/uL (ref 150–400)
RBC: 4.32 MIL/uL (ref 3.87–5.11)
RDW: 14.9 % (ref 11.5–15.5)
WBC: 7.5 10*3/uL (ref 4.0–10.5)
nRBC: 0 % (ref 0.0–0.2)

## 2023-04-08 LAB — TYPE AND SCREEN
ABO/RH(D): O POS
Antibody Screen: NEGATIVE

## 2023-04-08 LAB — GLUCOSE, CAPILLARY
Glucose-Capillary: 129 mg/dL — ABNORMAL HIGH (ref 70–99)
Glucose-Capillary: 110 mg/dL — ABNORMAL HIGH (ref 70–99)
Glucose-Capillary: 112 mg/dL — ABNORMAL HIGH (ref 70–99)
Glucose-Capillary: 116 mg/dL — ABNORMAL HIGH (ref 70–99)

## 2023-04-08 LAB — ABO/RH: ABO/RH(D): O POS

## 2023-04-08 SURGERY — RADIOLOGY WITH ANESTHESIA
Anesthesia: General

## 2023-04-08 MED ORDER — CLOPIDOGREL BISULFATE 75 MG PO TABS
75.0000 mg | ORAL_TABLET | Freq: Every day | ORAL | Status: DC
  Filled 2023-04-08: qty 1

## 2023-04-08 MED ORDER — LIDOCAINE 2% (20 MG/ML) 5 ML SYRINGE
INTRAMUSCULAR | Status: DC | PRN
  Administered 2023-04-08: 100 mg via INTRAVENOUS

## 2023-04-08 MED ORDER — NITROGLYCERIN 1 MG/10 ML FOR IR/CATH LAB
INTRA_ARTERIAL | Status: AC
  Filled 2023-04-08: qty 10

## 2023-04-08 MED ORDER — ATROPINE SULFATE 0.4 MG/ML IV SOLN
INTRAVENOUS | Status: DC | PRN
  Administered 2023-04-08: .4 mg via INTRAVENOUS

## 2023-04-08 MED ORDER — ONDANSETRON HCL 4 MG/2ML IJ SOLN
INTRAMUSCULAR | Status: DC | PRN
  Administered 2023-04-08: 4 mg via INTRAVENOUS

## 2023-04-08 MED ORDER — CLOPIDOGREL BISULFATE 75 MG PO TABS
ORAL_TABLET | ORAL | Status: AC
  Administered 2023-04-08: 75 mg via ORAL
  Filled 2023-04-08: qty 1

## 2023-04-08 MED ORDER — ASPIRIN 81 MG PO CHEW
81.0000 mg | CHEWABLE_TABLET | Freq: Every day | ORAL | Status: DC
Start: 2023-04-09 — End: 2023-04-09

## 2023-04-08 MED ORDER — DEXAMETHASONE SODIUM PHOSPHATE 10 MG/ML IJ SOLN
INTRAMUSCULAR | Status: DC | PRN
  Administered 2023-04-08: 5 mg via INTRAVENOUS

## 2023-04-08 MED ORDER — LIDOCAINE HCL 1 % IJ SOLN
INTRAMUSCULAR | Status: AC
Start: 2023-04-08 — End: ?
  Filled 2023-04-08: qty 20

## 2023-04-08 MED ORDER — FENTANYL CITRATE (PF) 100 MCG/2ML IJ SOLN
INTRAMUSCULAR | Status: AC
  Filled 2023-04-08: qty 2

## 2023-04-08 MED ORDER — CLEVIDIPINE BUTYRATE 0.5 MG/ML IV EMUL
INTRAVENOUS | Status: AC
  Administered 2023-04-08: 2 mg/h via INTRAVENOUS
  Filled 2023-04-08: qty 100

## 2023-04-08 MED ORDER — EPHEDRINE SULFATE-NACL 50-0.9 MG/10ML-% IV SOSY
PREFILLED_SYRINGE | INTRAVENOUS | Status: DC | PRN
  Administered 2023-04-08: 2.5 mg via INTRAVENOUS

## 2023-04-08 MED ORDER — HEPARIN (PORCINE) 25000 UT/250ML-% IV SOLN
INTRAVENOUS | Status: AC
  Filled 2023-04-08: qty 250

## 2023-04-08 MED ORDER — ACETAMINOPHEN 10 MG/ML IV SOLN
INTRAVENOUS | Status: AC
  Filled 2023-04-08: qty 100

## 2023-04-08 MED ORDER — CHLORHEXIDINE GLUCONATE 0.12 % MT SOLN: 15.0000 mL | Freq: Once | OROMUCOSAL | Status: AC

## 2023-04-08 MED ORDER — ACETAMINOPHEN 10 MG/ML IV SOLN: 1000.0000 mg | Freq: Once | INTRAVENOUS | Status: DC | PRN

## 2023-04-08 MED ORDER — IOHEXOL 300 MG/ML  SOLN
50.0000 mL | Freq: Once | INTRAMUSCULAR | Status: AC | PRN
  Administered 2023-04-08: 20 mL via INTRA_ARTERIAL

## 2023-04-08 MED ORDER — HEPARIN SODIUM (PORCINE) 1000 UNIT/ML IJ SOLN
INTRAMUSCULAR | Status: DC | PRN
  Administered 2023-04-08: 3000 [IU] via INTRAVENOUS
  Administered 2023-04-08: 2000 [IU] via INTRAVENOUS

## 2023-04-08 MED ORDER — SODIUM CHLORIDE 0.9 % IV SOLN
INTRAVENOUS | Status: DC
Start: 2023-04-08 — End: 2023-04-09

## 2023-04-08 MED ORDER — ORAL CARE MOUTH RINSE: 15.0000 mL | Freq: Once | OROMUCOSAL | Status: AC

## 2023-04-08 MED ORDER — IOHEXOL 300 MG/ML  SOLN
150.0000 mL | Freq: Once | INTRAMUSCULAR | Status: AC | PRN
  Administered 2023-04-08: 80 mL via INTRA_ARTERIAL

## 2023-04-08 MED ORDER — EPTIFIBATIDE 20 MG/10ML IV SOLN
INTRAVENOUS | Status: AC | PRN
  Administered 2023-04-08 (×3): 1.5 mg via INTRAVENOUS

## 2023-04-08 MED ORDER — ACETAMINOPHEN 325 MG PO TABS
650.0000 mg | ORAL_TABLET | ORAL | Status: DC | PRN
  Administered 2023-04-09: 650 mg via ORAL
  Filled 2023-04-08: qty 2

## 2023-04-08 MED ORDER — PROPOFOL 10 MG/ML IV BOLUS
INTRAVENOUS | Status: DC | PRN
  Administered 2023-04-08: 50 mg via INTRAVENOUS
  Administered 2023-04-08: 40 mg via INTRAVENOUS
  Administered 2023-04-08: 110 mg via INTRAVENOUS

## 2023-04-08 MED ORDER — CEFAZOLIN SODIUM-DEXTROSE 2-4 GM/100ML-% IV SOLN
INTRAVENOUS | Status: AC
  Filled 2023-04-08: qty 100

## 2023-04-08 MED ORDER — ROCURONIUM BROMIDE 10 MG/ML (PF) SYRINGE
PREFILLED_SYRINGE | INTRAVENOUS | Status: DC | PRN
  Administered 2023-04-08: 60 mg via INTRAVENOUS
  Administered 2023-04-08: 20 mg via INTRAVENOUS

## 2023-04-08 MED ORDER — EPTIFIBATIDE 20 MG/10ML IV SOLN
INTRAVENOUS | Status: AC
  Filled 2023-04-08: qty 10

## 2023-04-08 MED ORDER — CHLORHEXIDINE GLUCONATE 0.12 % MT SOLN
OROMUCOSAL | Status: AC
  Administered 2023-04-08: 15 mL via OROMUCOSAL
  Filled 2023-04-08: qty 15

## 2023-04-08 MED ORDER — FENTANYL CITRATE (PF) 250 MCG/5ML IJ SOLN
INTRAMUSCULAR | Status: DC | PRN
  Administered 2023-04-08 (×2): 50 ug via INTRAVENOUS

## 2023-04-08 MED ORDER — PHENYLEPHRINE HCL-NACL 20-0.9 MG/250ML-% IV SOLN
INTRAVENOUS | Status: DC | PRN
  Administered 2023-04-08: 20 ug/min via INTRAVENOUS
  Administered 2023-04-08: 40 ug/min via INTRAVENOUS

## 2023-04-08 MED ORDER — HEPARIN (PORCINE) 25000 UT/250ML-% IV SOLN
700.0000 [IU]/h | INTRAVENOUS | Status: DC
  Administered 2023-04-08: 700 [IU]/h via INTRAVENOUS

## 2023-04-08 MED ORDER — ASPIRIN 81 MG PO CHEW
81.0000 mg | CHEWABLE_TABLET | Freq: Every day | ORAL | Status: DC
  Administered 2023-04-09: 81 mg via ORAL
  Filled 2023-04-08: qty 1

## 2023-04-08 MED ORDER — ACETAMINOPHEN 500 MG PO TABS: 1000.0000 mg | ORAL_TABLET | Freq: Four times a day (QID) | ORAL | Status: DC | PRN

## 2023-04-08 MED ORDER — CLEVIDIPINE BUTYRATE 0.5 MG/ML IV EMUL: 0.0000 mg/h | INTRAVENOUS | Status: AC

## 2023-04-08 MED ORDER — ACETAMINOPHEN 650 MG RE SUPP: 650.0000 mg | RECTAL | Status: DC | PRN

## 2023-04-08 MED ORDER — LACTATED RINGERS IV SOLN: INTRAVENOUS | Status: DC | PRN

## 2023-04-08 MED ORDER — FENTANYL CITRATE (PF) 100 MCG/2ML IJ SOLN
25.0000 ug | INTRAMUSCULAR | Status: DC | PRN
  Administered 2023-04-08: 50 ug via INTRAVENOUS

## 2023-04-08 MED ORDER — CLOPIDOGREL BISULFATE 75 MG PO TABS
75.0000 mg | ORAL_TABLET | Freq: Every day | ORAL | Status: DC
  Administered 2023-04-09: 75 mg via ORAL
  Filled 2023-04-08: qty 1

## 2023-04-08 MED ORDER — HEPARIN (PORCINE) 25000 UT/250ML-% IV SOLN
500.0000 [IU]/h | INTRAVENOUS | Status: DC
Start: 2023-04-08 — End: 2023-04-08
  Administered 2023-04-08: 500 [IU]/h via INTRAVENOUS

## 2023-04-08 MED ORDER — CHLORHEXIDINE GLUCONATE CLOTH 2 % EX PADS: 6.0000 | MEDICATED_PAD | Freq: Every day | CUTANEOUS | Status: DC

## 2023-04-08 MED ORDER — OXYMETAZOLINE HCL 0.05 % NA SOLN
NASAL | Status: AC
  Filled 2023-04-08: qty 30

## 2023-04-08 MED ORDER — INSULIN ASPART 100 UNIT/ML IJ SOLN: 0.0000 [IU] | INTRAMUSCULAR | Status: DC | PRN

## 2023-04-08 MED ORDER — PHENYLEPHRINE 80 MCG/ML (10ML) SYRINGE FOR IV PUSH (FOR BLOOD PRESSURE SUPPORT)
PREFILLED_SYRINGE | INTRAVENOUS | Status: DC | PRN
  Administered 2023-04-08: 40 ug via INTRAVENOUS
  Administered 2023-04-08: 80 ug via INTRAVENOUS
  Administered 2023-04-08 (×2): 40 ug via INTRAVENOUS
  Administered 2023-04-08 (×2): 80 ug via INTRAVENOUS
  Administered 2023-04-08 (×2): 40 ug via INTRAVENOUS
  Administered 2023-04-08: 80 ug via INTRAVENOUS
  Administered 2023-04-08: 40 ug via INTRAVENOUS

## 2023-04-08 MED ORDER — LIDOCAINE HCL 1 % IJ SOLN
20.0000 mL | Freq: Once | INTRAMUSCULAR | Status: AC
  Administered 2023-04-08: 10 mL via INTRADERMAL

## 2023-04-08 MED ORDER — LIDOCAINE HCL 1 % IJ SOLN
INTRAMUSCULAR | Status: AC
  Filled 2023-04-08: qty 20

## 2023-04-08 MED ORDER — FENTANYL CITRATE (PF) 100 MCG/2ML IJ SOLN
INTRAMUSCULAR | Status: AC
  Administered 2023-04-08: 50 ug via INTRAVENOUS
  Filled 2023-04-08: qty 2

## 2023-04-08 MED ORDER — CLEVIDIPINE BUTYRATE 0.5 MG/ML IV EMUL
INTRAVENOUS | Status: AC
  Filled 2023-04-08: qty 50

## 2023-04-08 MED ORDER — SODIUM CHLORIDE 0.9 % IV SOLN: INTRAVENOUS | Status: AC

## 2023-04-08 MED ORDER — ONDANSETRON HCL 4 MG/2ML IJ SOLN: 4.0000 mg | Freq: Once | INTRAMUSCULAR | Status: DC | PRN

## 2023-04-08 MED ORDER — SUGAMMADEX SODIUM 200 MG/2ML IV SOLN
INTRAVENOUS | Status: DC | PRN
  Administered 2023-04-08: 200 mg via INTRAVENOUS

## 2023-04-08 MED ORDER — CEFAZOLIN SODIUM-DEXTROSE 2-3 GM-%(50ML) IV SOLR
INTRAVENOUS | Status: DC | PRN
  Administered 2023-04-08: 2 g via INTRAVENOUS

## 2023-04-08 MED ORDER — ACETAMINOPHEN 500 MG PO TABS
ORAL_TABLET | ORAL | Status: AC
  Administered 2023-04-08: 1000 mg via ORAL
  Filled 2023-04-08: qty 2

## 2023-04-08 MED ORDER — ACETAMINOPHEN 160 MG/5ML PO SOLN: 650.0000 mg | ORAL | Status: DC | PRN

## 2023-04-08 MED ORDER — CLOPIDOGREL BISULFATE 75 MG PO TABS: 75.0000 mg | ORAL_TABLET | Freq: Every day | ORAL | Status: DC

## 2023-04-08 NOTE — Transfer of Care (Addendum)
 Immediate Anesthesia Transfer of Care Note  Patient: Natasha Vasquez  Procedure(s) Performed: RADIOLOGY WITH ANESTHESIA  Patient Location: Short Stay  Anesthesia Type: N/A   Level of Consciousness: awake, alert , and oriented  Airway & Oxygen Therapy: Patient Spontanous Breathing  Post-op Assessment: Report given to RN and Post -op Vital signs reviewed and stable  Post vital signs: Reviewed and stable  Last Vitals:  Vitals Value Taken Time  BP 145/78   Temp    Pulse 64   Resp 17   SpO2 98     Last Pain:  Vitals:   04/08/23 0810  TempSrc:   PainSc: 0-No pain         Complications: No notable events documented.

## 2023-04-08 NOTE — Sedation Documentation (Signed)
 ACT 153 @ 1255

## 2023-04-08 NOTE — Progress Notes (Signed)
 PHARMACY - ANTICOAGULATION CONSULT NOTE  Pharmacy Consult for heparin Indication:  post neurointerventional procedure  Allergies  Allergen Reactions   Codeine Other (See Comments)    Chest pain   Ciprofloxacin     Can not take    Quinolones     PATIENT UNAWARE OF allergy to this class of antibiotics, only aware of allergy to [facility] Patient was warned about not using Cipro and similar antibiotics. Recent studies have raised concern that fluoroquinolone antibiotics could be associated with an increased risk of aortic aneurysm Fluoroquinolones have non-antimicrobial properties that might jeopardise the integrity of the extracellular matrix of the vascular wall In a  propensity score matched cohort study in [place], there was a 66% increased rate   Aleve [Naproxen Sodium] Hives, Rash and Other (See Comments)    headaches   Lorazepam Other (See Comments)    "Feels loopy"   Oxycodone Other (See Comments)    MENTAL CHANGES   Paregoric Other (See Comments)    Chest pain---tolerated MORPHINE   Penicillins Hives    Can take Augmentin    Patient Measurements: Height: 5\' 5"  (165.1 cm) Weight: 84.8 kg (187 lb) IBW/kg (Calculated) : 57 Heparin Dosing Weight: 75 kg  Vital Signs: Temp: 97.5 F (36.4 C) (03/24 1535) Temp Source: Oral (03/24 0612) BP: 150/78 (03/24 1406) Pulse Rate: 73 (03/24 1515)  Labs: Recent Labs    04/08/23 0610 04/08/23 0803  HGB 12.7  --   HCT 39.4  --   PLT 271  --   LABPROT  --  15.2  INR  --  1.2  CREATININE 1.20*  --     Estimated Creatinine Clearance: 38.2 mL/min (A) (by C-G formula based on SCr of 1.2 mg/dL (H)).   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  ER/PR NEGATIVE   Cataract    Chronic cough    Complex sleep apnea syndrome    AVAPS   Complication of anesthesia     20 years ago pts b/p dropped really low, "coded"   Depression    Depression    Diabetes mellitus without complication (HCC)    diet controlled   Family history of adverse reaction to anesthesia    son PONV   GERD (gastroesophageal reflux disease)    Gout    Hypercholesteremia    Hyperlipidemia    Hypertension    Melanoma (HCC)    MVP (mitral valve prolapse)    followed by PCP   Nephritis    Nephritis    S/P appendectomy    Stroke [facility])    Thoracic aortic aneurysm (HCC)    Torn meniscus    left    Medications:  Medications Prior to Admission  Medication Sig Dispense Refill Last Dose/Taking   acetaminophen (TYLENOL) 500 MG tablet Take 500 mg by mouth every 6 (six) hours as needed for moderate pain (pain score 4-6).   04/07/2023   albuterol (VENTOLIN HFA) 108 (90 Base) MCG/ACT inhaler TAKE 2 PUFFS BY MOUTH EVERY 6 HOURS AS NEEDED FOR WHEEZE OR SHORTNESS OF BREATH 8.5 each 2 Past Week   allopurinol (ZYLOPRIM) 100 MG tablet TAKE 2 TABLETS BY MOUTH EVERY DAY 180 tablet 1 04/08/2023 at  4:30 AM   ARIPiprazole (ABILIFY) 2 MG tablet TAKE 1 TABLET BY MOUTH  EVERY DAY 90 tablet 1 04/08/2023 at  4:30 AM   aspirin EC 81 MG tablet Take 81 mg by mouth daily. Swallow whole.   04/08/2023 at  4:30 AM   bismuth subsalicylate (PEPTO BISMOL) 262 MG chewable tablet Chew 262 mg by mouth as needed for indigestion or diarrhea or loose stools. **capsule**   Past Week   calcium carbonate (TUMS - DOSED IN MG ELEMENTAL CALCIUM) 500 MG chewable tablet Chew 1 tablet by mouth as needed for indigestion or heartburn.   04/07/2023   carboxymethylcellulose (REFRESH PLUS) 0.5 % SOLN Place 1 drop into both eyes 3 (three) times daily as needed (Dry Eyes).   Past Week   carvedilol (COREG) 6.25 MG tablet Take 1 tablet (6.25 mg total) by mouth 2 (two) times daily. 180 tablet 3 04/08/2023 at  4:30 AM   Cholecalciferol (VITAMIN D-3 PO) Take 1 tablet by mouth daily.   Past Week   clopidogrel (PLAVIX) 75 MG tablet Take 1 tablet  (75 mg total) by mouth daily. 90 tablet 1 04/08/2023 at  4:30 AM   colestipol (COLESTID) 1 g tablet TAKE 1 TABLET BY MOUTH 2 TIMES DAILY. 180 tablet 5 04/08/2023 at  4:30 AM   famotidine (PEPCID) 20 MG tablet TAKE 1 TABLET BY MOUTH TWICE A DAY 180 tablet 0 04/08/2023 at  4:30 AM   ferrous sulfate 325 (65 FE) MG tablet Take 325 mg by mouth daily with breakfast.   04/07/2023   fluocinonide (LIDEX) 0.05 % external solution Apply 1 Application topically daily as needed (scalp).   Past Month   furosemide (LASIX) 20 MG tablet TAKE 1 TABLET BY MOUTH EVERY DAY 90 tablet 3 04/07/2023   lisinopril (ZESTRIL) 10 MG tablet Take 1 tablet (10 mg total) by mouth daily. 90 tablet 1 04/07/2023   Multiple Vitamins-Minerals (CENTRUM SILVER PO) Take 1 tablet by mouth daily.   04/07/2023   oxybutynin (DITROPAN-XL) 10 MG 24 hr tablet TAKE 1 TABLET BY MOUTH EVERY DAY 90 tablet 1 04/08/2023 at  4:30 AM   pantoprazole (PROTONIX) 40 MG tablet Take 1 tablet (40 mg total) by mouth 2 (two) times daily before a meal. 180 tablet 1 04/08/2023 at  4:30 AM   potassium chloride (KLOR-CON) 10 MEQ tablet TAKE 1 TABLET BY MOUTH EVERY DAY 90 tablet 3 04/07/2023   pregabalin (LYRICA) 25 MG capsule Take 1 capsule (25 mg total) by mouth 2 (two) times daily. 60 capsule 1 04/08/2023 at  4:30 AM   rosuvastatin (CRESTOR) 20 MG tablet Take 1 tablet (20 mg total) by mouth daily. 90 tablet 1 04/08/2023 at  4:30 AM   tirzepatide [facility]) 5 MG/0.5ML Pen Inject 5 mg into the skin once a week.   03/20/2023   traMADol (ULTRAM) 50 MG tablet Take 1 tablet (50 mg total) by mouth every 8 (eight) hours as needed for up to 5 days for severe pain (pain score 7-10). 15 tablet 0 Past Week   Venlafaxine HCl 225 MG TB24 TAKE 1 TABLET (225 MG TOTAL) BY MOUTH DAILY. PLEASE SCHEDULE OFFICE VISIT BEFORE ANY FUTURE REFILLS 90 tablet 0 04/08/2023 at  4:30 AM    Assessment: 83 y.o. F s/p revascularization of L ICA proximal stenosis. Heparin started in PACU at 500 units/hr. No AC  PTA.  Goal of Therapy:  Heparin level 0.1-0.25 units/ml Monitor platelets by anticoagulation protocol: Yes   Plan:  Increase heparin gtt to 700 units/hr. Heparin to be turned off at 0800 3/25 for sheath removal. Will f/u heparin  level in 8 hours.  Christoper Fabian, PharmD, BCPS Please see amion for complete clinical pharmacist phone list 04/08/2023,3:51 PM

## 2023-04-08 NOTE — Anesthesia Procedure Notes (Signed)
 Arterial Line Insertion Start/End3/24/2025 7:22 AM, 04/08/2023 7:25 AM Performed by: Camillia Herter, CRNA, CRNA  Preanesthetic checklist: patient identified, IV checked, site marked, risks and benefits discussed, surgical consent, monitors and equipment checked, pre-op evaluation, timeout performed and anesthesia consent Lidocaine 1% used for infiltration Left, radial was placed Catheter size: 20 G Hand hygiene performed  and Seldinger technique used Allen's test indicative of satisfactory collateral circulation Attempts: 1 Procedure performed without using ultrasound guided technique. Following insertion, dressing applied and Biopatch. Post procedure assessment: normal and unchanged  Patient tolerated the procedure well with no immediate complications.

## 2023-04-08 NOTE — Sedation Documentation (Signed)
 Patient moved to table by staff , secured, hooked to monitors, and now under the care of anesthesia. Please see charting in vitals per CRNA.

## 2023-04-08 NOTE — Procedures (Signed)
 INR.  Status post left common carotid arteriogram.  Left CFA approach.  Findings.  1.  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  Status post revascularization of symptomatic left ICA proximal stenosis with stent assisted angioplasty, and proximal flow rest.  Post CT no evidence of hemorrhagic complications.  8 French Angio-Seal deployed for hemostasis at the left groin puncture site.  Distal pulses all present.  Patient extubated.  Denies any headaches or nausea or  vomiting.  Pupils 3 to 4 mm reactive sluggishly bilaterally.  No facial asymmetry.  Tongue midline.  Moves all fours spontaneously and to command proportional to effort.  Fatima Sanger MD.

## 2023-04-08 NOTE — Sedation Documentation (Signed)
ACT 199.

## 2023-04-08 NOTE — Sedation Documentation (Addendum)
 Patient transported to recovery area via stretcher with CRNA. Bedside report given to RN. Femoral site assessed - Level 0, no hematoma, dressing is clean, dry, and intact. Pulses also assessed bilaterally.

## 2023-04-08 NOTE — Anesthesia Procedure Notes (Signed)
 Procedure Name: Intubation Date/Time: 04/08/2023 11:42 AM  Performed by: Camillia Herter, CRNAPre-anesthesia Checklist: Patient identified, Emergency Drugs available, Suction available and Patient being monitored Patient Re-evaluated:Patient Re-evaluated prior to induction Oxygen Delivery Method: Circle System Utilized Preoxygenation: Pre-oxygenation with 100% oxygen Induction Type: IV induction Ventilation: Mask ventilation without difficulty Laryngoscope Size: Miller and 2 Grade View: Grade II Tube type: Oral Tube size: 7.0 mm Number of attempts: 1 Airway Equipment and Method: Stylet and Oral airway Placement Confirmation: ETT inserted through vocal cords under direct vision, positive ETCO2 and breath sounds checked- equal and bilateral Secured at: 21 cm Tube secured with: Tape Dental Injury: Teeth and Oropharynx as per pre-operative assessment

## 2023-04-08 NOTE — Sedation Documentation (Signed)
 Transport to PACU with CRNA.

## 2023-04-08 NOTE — Anesthesia Postprocedure Evaluation (Signed)
 Anesthesia Post Note  Patient: Natasha Vasquez  Procedure(s) Performed: RADIOLOGY WITH ANESTHESIA     Patient location during evaluation: PACU Anesthesia Type: General Level of consciousness: awake and alert Pain management: pain level controlled Vital Signs Assessment: post-procedure vital signs reviewed and stable Respiratory status: spontaneous breathing, nonlabored ventilation and respiratory function stable Cardiovascular status: blood pressure returned to baseline Postop Assessment: no apparent nausea or vomiting Anesthetic complications: yes Comments: Small skin tear on upper lip noted by PACU RN. Spoke to CRNA and this was noted when tape was removed during extubation. Very small amount but continuous bleeding noted. Pressure with gauze applied but after several minutes this had not helped. Spoke to ENT who recommended Afrin soaked gauze which was applied and gently taped to surrounding skin. Stephannie Peters, MD   Encounter Notable Events  Notable Event Outcome Phase Comment  Cutaneous injury / Laceration / Skin tear  Intraprocedure upper lip    Last Vitals:  Vitals:   04/08/23 1500 04/08/23 1515  BP:    Pulse: 75 73  Resp: 12 11  Temp:    SpO2: 94% 94%    Last Pain:  Vitals:   04/08/23 1440  TempSrc:   PainSc: 6                  Shanda Howells

## 2023-04-08 NOTE — Sedation Documentation (Addendum)
 Procedure rescheduled due to emergent Code Stroke. CRNA to transport to Short Stay.

## 2023-04-09 ENCOUNTER — Other Ambulatory Visit (HOSPITAL_COMMUNITY): Payer: Self-pay | Admitting: Radiology

## 2023-04-09 ENCOUNTER — Other Ambulatory Visit: Payer: Self-pay | Admitting: Interventional Radiology

## 2023-04-09 ENCOUNTER — Ambulatory Visit: Payer: Medicare PPO

## 2023-04-09 ENCOUNTER — Other Ambulatory Visit: Payer: Self-pay | Admitting: Family Medicine

## 2023-04-09 ENCOUNTER — Encounter (HOSPITAL_COMMUNITY): Payer: Self-pay | Admitting: Interventional Radiology

## 2023-04-09 DIAGNOSIS — I6522 Occlusion and stenosis of left carotid artery: Secondary | ICD-10-CM

## 2023-04-09 LAB — CBC WITH DIFFERENTIAL/PLATELET
Abs Immature Granulocytes: 0.05 10*3/uL (ref 0.00–0.07)
Basophils Absolute: 0 10*3/uL (ref 0.0–0.1)
Basophils Relative: 0 %
Eosinophils Absolute: 0 10*3/uL (ref 0.0–0.5)
Eosinophils Relative: 0 %
HCT: 32 % — ABNORMAL LOW (ref 36.0–46.0)
Hemoglobin: 10.3 g/dL — ABNORMAL LOW (ref 12.0–15.0)
Immature Granulocytes: 1 %
Lymphocytes Relative: 13 %
Lymphs Abs: 1.2 10*3/uL (ref 0.7–4.0)
MCH: 29.3 pg (ref 26.0–34.0)
MCHC: 32.2 g/dL (ref 30.0–36.0)
MCV: 90.9 fL (ref 80.0–100.0)
Monocytes Absolute: 0.5 10*3/uL (ref 0.1–1.0)
Monocytes Relative: 6 %
Neutro Abs: 7 10*3/uL (ref 1.7–7.7)
Neutrophils Relative %: 80 %
Platelets: 248 10*3/uL (ref 150–400)
RBC: 3.52 MIL/uL — ABNORMAL LOW (ref 3.87–5.11)
RDW: 14.8 % (ref 11.5–15.5)
WBC: 8.8 10*3/uL (ref 4.0–10.5)
nRBC: 0 % (ref 0.0–0.2)

## 2023-04-09 LAB — MRSA NEXT GEN BY PCR, NASAL: MRSA by PCR Next Gen: NOT DETECTED

## 2023-04-09 LAB — BASIC METABOLIC PANEL
Anion gap: 7 (ref 5–15)
BUN: 13 mg/dL (ref 8–23)
CO2: 24 mmol/L (ref 22–32)
Calcium: 8.1 mg/dL — ABNORMAL LOW (ref 8.9–10.3)
Chloride: 108 mmol/L (ref 98–111)
Creatinine, Ser: 0.93 mg/dL (ref 0.44–1.00)
GFR, Estimated: >60 mL/min (ref >60)
Glucose, Bld: 121 mg/dL — ABNORMAL HIGH (ref 70–99)
Potassium: 3.9 mmol/L (ref 3.5–5.1)
Sodium: 139 mmol/L (ref 135–145)

## 2023-04-09 LAB — HEPARIN LEVEL (UNFRACTIONATED): Heparin Unfractionated: <0.1 [IU]/mL — ABNORMAL LOW (ref 0.30–0.70)

## 2023-04-09 MED ORDER — VENLAFAXINE HCL ER 75 MG PO CP24
225.0000 mg | ORAL_CAPSULE | Freq: Every day | ORAL | Status: DC
  Administered 2023-04-09: 225 mg via ORAL
  Filled 2023-04-09 (×2): qty 1

## 2023-04-09 MED ORDER — PANTOPRAZOLE SODIUM 40 MG PO TBEC
40.0000 mg | DELAYED_RELEASE_TABLET | Freq: Two times a day (BID) | ORAL | Status: DC
  Administered 2023-04-09: 40 mg via ORAL
  Filled 2023-04-09: qty 1

## 2023-04-09 MED ORDER — LISINOPRIL 10 MG PO TABS
10.0000 mg | ORAL_TABLET | Freq: Every day | ORAL | Status: DC
  Administered 2023-04-09: 10 mg via ORAL
  Filled 2023-04-09: qty 1

## 2023-04-09 MED ORDER — HEPARIN (PORCINE) 25000 UT/250ML-% IV SOLN: 850.0000 [IU]/h | INTRAVENOUS | Status: DC

## 2023-04-09 MED ORDER — ARIPIPRAZOLE 2 MG PO TABS
2.0000 mg | ORAL_TABLET | Freq: Every day | ORAL | Status: DC
  Administered 2023-04-09: 2 mg via ORAL
  Filled 2023-04-09: qty 1

## 2023-04-09 MED ORDER — CARVEDILOL 3.125 MG PO TABS
6.2500 mg | ORAL_TABLET | Freq: Two times a day (BID) | ORAL | Status: DC
  Administered 2023-04-09: 6.25 mg via ORAL
  Filled 2023-04-09: qty 2

## 2023-04-09 MED ORDER — CLOPIDOGREL BISULFATE 75 MG PO TABS: 75.0000 mg | ORAL_TABLET | Freq: Every day | ORAL | 1 refills | Status: DC

## 2023-04-09 MED ORDER — ORAL CARE MOUTH RINSE: 15.0000 mL | OROMUCOSAL | Status: DC | PRN

## 2023-04-09 NOTE — Progress Notes (Signed)
 Physical Therapy Quick Note  PT has completed initial evaluation.    Overall, patient at supervision assistance level.   PT Follow up recommended: Outpatient PT (pt was receiving outpatient PT through [facility] prior to surgery) Equipment recommended:  None recommended Complete evaluation note to follow.     Arlyss Gandy, PT, DPT Acute Rehabilitation Office [phone]

## 2023-04-09 NOTE — Progress Notes (Addendum)
 Referring Provider(s): Dr. 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 Corliss Skains   Subjective:  Natasha Vasquez is an 83 year old female s/p diagnostic catheter arteriogram on 02/27/23 for acute onset of right sided weakness and mild aphasia.  She underwent a diagnostic arteriogram revealing severe pre occlusive stenosis of the proximal left ICA.  MRI demonstrated small left midbrain cerebral peduncle diffusion weighted abnormality compatible with a stroke.    Pt is s/p revascularized of ICA proximal stenosis w/ stent assisted angioplasty 04/08/23 with Dr. Corliss Skains. Pt was seen today 04/09/23 while inpatient.  Pt states she is feeling well and is currently enjoying a full diet.  She has been lying flat post procedure.  Pt states she is feeling the same as pre procedure.    Allergies: Codeine, Ciprofloxacin, Quinolones, Aleve [naproxen sodium], Lorazepam, Oxycodone, Paregoric, and Penicillins  Medications: Prior to Admission medications   Medication Sig Start Date End Date Taking? Authorizing Provider  acetaminophen (TYLENOL) 500 MG tablet Take 500 mg by mouth every 6 (six) hours as needed for moderate pain (pain score 4-6).   Yes [provider]  albuterol (VENTOLIN HFA) 108 (90 Base) MCG/ACT inhaler TAKE 2 PUFFS BY MOUTH EVERY 6 HOURS AS NEEDED FOR WHEEZE OR SHORTNESS OF BREATH 05/29/21  Yes Salena Saner, MD  allopurinol (ZYLOPRIM) 100 MG tablet TAKE 2 TABLETS BY MOUTH EVERY DAY 10/25/22  Yes Bacigalupo, Marzella Schlein, MD  ARIPiprazole (ABILIFY) 2 MG tablet TAKE 1 TABLET BY MOUTH EVERY DAY 03/01/23  Yes Bacigalupo, Marzella Schlein, MD  aspirin EC 81 MG tablet Take 81 mg by mouth daily. Swallow whole.   Yes [provider]  bismuth subsalicylate (PEPTO BISMOL)  262 MG chewable tablet Chew 262 mg by mouth as needed for indigestion or diarrhea or loose stools. **capsule**   Yes [provider]  calcium carbonate (TUMS - DOSED IN MG ELEMENTAL CALCIUM) 500 MG chewable tablet Chew 1 tablet by mouth as needed for indigestion or heartburn.   Yes [provider]  carboxymethylcellulose (REFRESH PLUS) 0.5 % SOLN Place 1 drop into both eyes 3 (three) times daily as needed (Dry Eyes).   Yes [provider]  carvedilol (COREG) 6.25 MG tablet Take 1 tablet (6.25 mg total) by mouth 2 (two) times daily. 01/29/23  Yes Wittenborn, Gavin Pound, NP  Cholecalciferol (VITAMIN D-3 PO) Take 1 tablet by mouth daily.   Yes [provider]  clopidogrel (PLAVIX) 75 MG tablet Take 1 tablet (75 mg total) by mouth daily. 03/02/23  Yes Rodolph Bong, MD  colestipol (COLESTID) 1 g tablet TAKE 1 TABLET BY MOUTH 2 TIMES DAILY. 10/30/22  Yes Bacigalupo, Marzella Schlein, MD  famotidine (PEPCID) 20 MG tablet TAKE 1 TABLET BY MOUTH TWICE A DAY 02/26/23  Yes Bacigalupo, Marzella Schlein, MD  ferrous sulfate 325 (65 FE) MG tablet Take 325 mg by mouth daily with breakfast.   Yes [provider]  fluocinonide (LIDEX) 0.05 % external solution Apply 1 Application topically daily as needed (scalp). 10/31/18  Yes [provider]  furosemide (LASIX) 20 MG tablet TAKE 1 TABLET BY MOUTH EVERY DAY 06/04/22  Yes Gollan, Tollie Pizza, MD  lisinopril (ZESTRIL) 10 MG tablet Take 1 tablet (10 mg total) by mouth daily. 05/01/22  Yes Bacigalupo, Marzella Schlein, MD  Multiple Vitamins-Minerals (CENTRUM SILVER PO) Take 1 tablet by mouth daily.   Yes [provider]  oxybutynin (DITROPAN-XL) 10 MG 24 hr tablet TAKE 1 TABLET BY MOUTH EVERY DAY 10/25/22  Yes Bacigalupo, Marzella Schlein, MD  pantoprazole (PROTONIX) 40 MG tablet Take 1 tablet (40 mg total) by mouth 2 (two) times daily before a meal. 03/12/23  Yes Bacigalupo, Marzella Schlein, MD  potassium chloride (KLOR-CON) 10 MEQ tablet TAKE 1 TABLET  BY MOUTH EVERY DAY 06/04/22  Yes Gollan, Tollie Pizza, MD  pregabalin (LYRICA) 25 MG capsule Take 1 capsule (25 mg total) by mouth 2 (two) times daily. 04/05/23  Yes Danelle Berry, PA-C  rosuvastatin (CRESTOR) 20 MG tablet Take 1 tablet (20 mg total) by mouth daily. 03/01/23  Yes Rodolph Bong, MD  tirzepatide [facility]) 5 MG/0.5ML Pen Inject 5 mg into the skin once a week.   Yes [provider]  traMADol (ULTRAM) 50 MG tablet Take 1 tablet (50 mg total) by mouth every 8 (eight) hours as needed for up to 5 days for severe pain (pain score 7-10). 04/05/23 04/10/23 Yes Danelle Berry, PA-C  Venlafaxine HCl 225 MG TB24 TAKE 1 TABLET (225 MG TOTAL) BY MOUTH DAILY. PLEASE SCHEDULE OFFICE VISIT BEFORE ANY FUTURE REFILLS 02/26/23  Yes Erasmo Downer, MD     Vital Signs: BP (!) 154/67   Pulse 62   Temp 98 F (36.7 C) (Oral)   Resp 13   Ht 5\' 5"  (1.651 m)   Wt 215 lb 13.3 oz (97.9 kg)   SpO2 93%   BMI 35.92 kg/m   Physical Exam Constitutional:      Appearance: She is well-developed.  HENT:     Head: Atraumatic.     Mouth/Throat:     Mouth: Mucous membranes are moist.     Comments: Upper lip bruising from endotracheal tube  Cardiovascular:     Rate and Rhythm: Normal rate and regular rhythm.     Heart sounds: No murmur heard. Pulmonary:     Effort: Pulmonary effort is normal.     Breath sounds: Normal breath sounds.  Abdominal:     General: Bowel sounds are normal.     Palpations: Abdomen is soft.  Musculoskeletal:     Comments: Right sided weakness, 2+  Left side 3+   Skin:    General: Skin is warm.  Neurological:     Mental Status: She is alert and oriented to person, place, and time.     Motor: Weakness present.     Comments: Left sided weakness present, unchanged from pre procedure evaluation 2+   Psychiatric:        Mood and Affect: Mood normal.        Behavior: Behavior normal.    Labs:  CBC: Recent Labs    02/26/23 0317 02/27/23 0334 04/08/23 0610  04/09/23 0533  WBC 8.9 8.4 7.5 8.8  HGB 12.5 12.5 12.7 10.3*  HCT 37.8 37.1 39.4 32.0*  PLT 288 269 271 248    COAGS: Recent Labs    02/25/23 1113 04/08/23 0803  INR 1.0 1.2  APTT 28  --     BMP: Recent Labs    02/28/23 0349 03/01/23 0348 04/08/23 0610 04/09/23 0533  NA 141 138 141 139  K 3.7 3.6 3.7 3.9  CL 106 104 105 108  CO2 24 24 27 24   GLUCOSE 109* 125* 110* 121*  BUN 20 16 20 13   CALCIUM 9.1 8.9 9.5 8.1*  CREATININE 0.95  0.88 1.20* 0.93  GFRNONAA 60* >60 45* >60    LIVER FUNCTION TESTS: Recent Labs    05/18/22 1610 02/11/23 0945 02/25/23 1113  BILITOT 0.2 0.4 0.6  AST 21 30 34  ALT 21 34* 40  ALKPHOS 102 105 71  PROT 6.8 7.0 7.3  ALBUMIN 4.6 4.7 4.5    Assessment and Plan:  Pt is with severe pre occlusive stenosis of proximal left ICA - s/prevascularized of ICA proximal stenosis w/ stent assisted angioplasty 04/08/23 with Dr. Corliss Skains.    Pt has been lying flat, she will be permitted to sit up in the chair post removal of groin site sheath.  Left groin site is clean, dry, without bleeding, without signs of infection, no tenderness, and stitch intact.  Left groin site sheath to be removed by RN today.  Pt has some upper lip bruising from endotracheal tube; she had blood tinged sputum yesterday that has since resolved.    Pt moves all four extremities, blinks, moves tongue side to side. Good pulses in all four extremities.  Strength 3+ in left sided extremities; strength 2+ in left sided extremities, remaining unchanged from pre-operative state.    Pt is currently on heparin drip and will go home on aspirin and plavix, likely to discharge today 04/09/23.  She will be weaned off of clevidipine prior to discharge.  She will meet with PT prior to discharge.  She will follow up with Dr. Corliss Skains in 2 weeks.      Discharge instructions:  - No Bending or stooping for 2 weeks - No pushing or pulling greater than 10 pounds for 2 weeks - No driving for 2  weeks. - Follow up with Dr. Corliss Skains in 2 weeks   Electronically Signed: Loman Brooklyn, PA-C 04/09/2023, 9:14 AM    I spent a total of 25 Minutes at the the patient's bedside AND on the patient's hospital floor or unit, greater than 50% of which was counseling/coordinating care for revascularized of ICA proximal stenosis w/ stent assisted angioplasty

## 2023-04-09 NOTE — Progress Notes (Signed)
 PHARMACY - ANTICOAGULATION  Pharmacy Consult for heparin Indication:  post neurointerventional procedure Brief A/P: Heparin level subtherapeutic Increase Heparin rate  Allergies  Allergen Reactions   Codeine Other (See Comments)    Chest pain   Ciprofloxacin     Can not take    Quinolones     PATIENT UNAWARE OF allergy to this class of antibiotics, only aware of allergy to [facility] Patient was warned about not using Cipro and similar antibiotics. Recent studies have raised concern that fluoroquinolone antibiotics could be associated with an increased risk of aortic aneurysm Fluoroquinolones have non-antimicrobial properties that might jeopardise the integrity of the extracellular matrix of the vascular wall In a  propensity score matched cohort study in [place], there was a 66% increased rate   Aleve [Naproxen Sodium] Hives, Rash and Other (See Comments)    headaches   Lorazepam Other (See Comments)    "Feels loopy"   Oxycodone Other (See Comments)    MENTAL CHANGES   Paregoric Other (See Comments)    Chest pain---tolerated MORPHINE   Penicillins Hives    Can take Augmentin    Patient Measurements: Height: 5\' 5"  (165.1 cm) Weight: 97.4 kg (214 lb 11.7 oz) IBW/kg (Calculated) : 57 Heparin Dosing Weight: 75 kg  Vital Signs: Temp: 98 F (36.7 C) (03/25 0000) Temp Source: Oral (03/25 0000) BP: 144/76 (03/25 0000) Pulse Rate: 61 (03/25 0000)  Labs: Recent Labs    04/08/23 0610 04/08/23 0803 04/09/23 0001  HGB 12.7  --   --   HCT 39.4  --   --   PLT 271  --   --   LABPROT  --  15.2  --   INR  --  1.2  --   HEPARINUNFRC  --   --  <0.10*  CREATININE 1.20*  --   --     Estimated Creatinine Clearance: 41 mL/min (A) (by C-G formula based on SCr of 1.2 mg/dL (H)). Assessment: 83 y.o. female s/p revascularization of L ICA proximal stenosis for heparin  Goal of Therapy:  Heparin level 0.1-0.25 units/ml Monitor platelets by anticoagulation protocol: Yes   Plan:  Increase  Heparin 850 units/hr  Geannie Risen, PharmD, BCPS  04/09/2023,1:21 AM

## 2023-04-09 NOTE — Evaluation (Signed)
 Physical Therapy Evaluation Patient Details Name: Natasha Vasquez MRN: [id number] DOB: 1940/10/16 Today's Date: 04/09/2023  History of Present Illness  83 y.o. 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 PMH includes OA, depression, gout, HLD, HTN, melanoma, CVA.  Clinical Impression  Pt presents to PT with no significant acute deficits compared to pre-procedure. Pt does have residual deficits in gait, functional mobility, and in R sided strength since CVA in February, however she reports these continue to improve. Pt is able to ambulate for household distances with support of RW. PT recommends discharge home with continued PT services at [facility] when medically appropriate.      If plan is discharge home, recommend the following: A little help with bathing/dressing/bathroom;Assistance with cooking/housework;Help with stairs or ramp for entrance;Assist for transportation   Can travel by private vehicle        Equipment Recommendations None recommended by PT  Recommendations for Other Services       Functional Status Assessment Patient has had a recent decline in their functional status and demonstrates the ability to make significant improvements in function in a reasonable and predictable amount of time.     Precautions / Restrictions Precautions Precautions: Fall Recall of Precautions/Restrictions: Intact Restrictions Weight Bearing Restrictions Per Provider Order: No      Mobility  Bed Mobility Overal bed mobility: Needs Assistance Bed Mobility: Supine to Sit     Supine to sit: Min assist          Transfers Overall transfer level: Needs assistance Equipment used: Rolling walker (2 wheels) Transfers: Sit to/from Stand Sit to Stand: Supervision                Ambulation/Gait Ambulation/Gait assistance: Supervision Gait Distance (Feet): 200 Feet Assistive device: Rolling  walker (2 wheels) Gait Pattern/deviations: Step-through pattern Gait velocity: reduced Gait velocity interpretation: 1.31 - 2.62 ft/sec, indicative of limited community ambulator   General Gait Details: slowed step-through gait, mild R foot drag through swing phase  Stairs            Wheelchair Mobility     Tilt Bed    Modified Rankin (Stroke Patients Only)       Balance Overall balance assessment: Needs assistance Sitting-balance support: No upper extremity supported, Feet supported Sitting balance-Leahy Scale: Good     Standing balance support: No upper extremity supported Standing balance-Leahy Scale: Fair                               Pertinent Vitals/Pain Pain Assessment Pain Assessment: Faces Faces Pain Scale: Hurts little more Pain Location: R hip Pain Descriptors / Indicators: Grimacing Pain Intervention(s): Monitored during session    Home Living Family/patient expects to be discharged to:: Private residence Living Arrangements: Spouse/significant other Available Help at Discharge: Family;Available 24 hours/day Type of Home: Independent living facility Home Access: Level entry       Home Layout: One level Home Equipment: Cane - single point;Shower seat;Grab bars - tub/shower;Grab bars - toilet;BSC/3in1;Rolling Walker (2 wheels) Additional Comments: pt resides at [facility] with her spouse who has early dementia    Prior Function Prior Level of Function : Independent/Modified Independent;Driving             Mobility Comments: ambulatory without DME prior to CVA, has been utilizing a RW since discharge from hospital in February  Extremity/Trunk Assessment   Upper Extremity Assessment Upper Extremity Assessment: RUE deficits/detail RUE Deficits / Details: grossly 4/5    Lower Extremity Assessment Lower Extremity Assessment: RLE deficits/detail RLE Deficits / Details: grossly 4-/5    Cervical / Trunk  Assessment Cervical / Trunk Assessment: Normal  Communication   Communication Communication: Impaired Factors Affecting Communication: Difficulty expressing self    Cognition Arousal: Alert Behavior During Therapy: WFL for tasks assessed/performed   PT - Cognitive impairments: No apparent impairments                         Following commands: Intact       Cueing Cueing Techniques: Verbal cues     General Comments General comments (skin integrity, edema, etc.): VSS on RA    Exercises     Assessment/Plan    PT Assessment Patient needs continued PT services  PT Problem List Decreased strength;Decreased activity tolerance;Decreased balance;Decreased mobility       PT Treatment Interventions DME instruction;Gait training;Stair training;Functional mobility training;Therapeutic activities;Therapeutic exercise;Balance training;Neuromuscular re-education;Patient/family education    PT Goals (Current goals can be found in the Care Plan section)  Acute Rehab PT Goals Patient Stated Goal: to return home, regain independence PT Goal Formulation: With patient Time For Goal Achievement: 04/23/23 Potential to Achieve Goals: Good    Frequency Min 1X/week     Co-evaluation               AM-PAC PT "6 Clicks" Mobility  Outcome Measure Help needed turning from your back to your side while in a flat bed without using bedrails?: A Little Help needed moving from lying on your back to sitting on the side of a flat bed without using bedrails?: A Little Help needed moving to and from a bed to a chair (including a wheelchair)?: A Little Help needed standing up from a chair using your arms (e.g., wheelchair or bedside chair)?: A Little Help needed to walk in hospital room?: A Little Help needed climbing 3-5 steps with a railing? : A Little 6 Click Score: 18    End of Session Equipment Utilized During Treatment: Gait belt Activity Tolerance: Patient tolerated treatment  well Patient left: in chair;with call bell/phone within reach Nurse Communication: Mobility status PT Visit Diagnosis: Other abnormalities of gait and mobility (R26.89);Muscle weakness (generalized) (M62.81)    Time: 7829-5621 PT Time Calculation (min) (ACUTE ONLY): 17 min   Charges:   PT Evaluation $PT Eval Low Complexity: 1 Low   PT General Charges $$ ACUTE PT VISIT: 1 Visit         Arlyss Gandy, PT, DPT Acute Rehabilitation Office [phone]   Arlyss Gandy 04/09/2023, 1:47 PM

## 2023-04-09 NOTE — Discharge Summary (Signed)
 Patient ID: Natasha Vasquez. Doke MRN: [id number] DOB/AGE: 08/31/1940  83 y.o.  Admit date: 04/08/2023 Discharge date: 04/09/2023   Supervising Physician: Julieanne Cotton   Patient Status: [facility] - In-pt   Admission Diagnoses: Severe pre occlusive stenosis of proximal left ICA    Discharge Diagnoses:  Principal Problem: Arterial stenosis       Discharged Condition: good   Hospital Course:    Natasha Vasquez is an 83 year old female s/p diagnostic catheter arteriogram on 02/27/23 for acute onset of right sided weakness and mild aphasia.  She underwent a diagnostic arteriogram revealing severe pre occlusive stenosis of the proximal left ICA.  MRI demonstrated small left midbrain cerebral peduncle diffusion weighted abnormality compatible with a stroke. Pt is status post revascularization of symptomatic left ICA proximal stenosis with stent assisted angioplasty, on 04/08/23 with Dr. Corliss Skains. Patient tolerated procedure well. This morning, patient resumed full diet, and was seen by Physical Therapy. Please refer to PT note for discharge recommendations. Patient is ready for discharge.   Follow-up visit with Dr. Corliss Skains expected in 2 weeks.  No imaging needed at initial follow-up.      Discharge on DAPT: - Aspirin 81mg  daily, Plavix 75mg  daily      Discharge Exam: Blood pressure 141/66 (MAP 87), pulse 58, temperature 97.7 F (36.8 C), temperature source Oral, resp. rate 15, SpO2 97%. General appearance: alert, cooperative, and no distress Resp: clear to auscultation bilaterally Cardio: regular rate and rhythm, S1, S2 normal, no murmur, click, rub or gallop GI: soft, non-tender; bowel sounds normal; no masses,  no organomegaly Pulses: 2+ and symmetric Skin: Skin color, texture, turgor normal. No rashes or lesions or wounds, bilateral lower extremity edema which is stable. Incision/Wound: L groin procedure site intact, clean, and dry.    Disposition: Discharge disposition: 01-Home  or Self Care     Discharge Instructions       Call MD for:  difficulty breathing, headache or visual disturbances   Complete by: As directed      Call MD for:  persistant dizziness or light-headedness   Complete by: As directed      Call MD for:  persistant nausea and vomiting   Complete by: As directed      Call MD for:  redness, tenderness, or signs of infection (pain, swelling, redness, odor or green/yellow discharge around incision site)   Complete by: As directed      Call MD for:  severe uncontrolled pain   Complete by: As directed      Diet - low sodium heart healthy   Complete by: As directed      Discharge instructions   Complete by: As directed      -Continue aspirin 81mg , Plavix 75mg  daily for 3 days.  -No bending, lifting, stooping for 2 weeks.  -No driving for 2 weeks.  -Maintain adequate hydration.    Expect follow-up with Dr. Corliss Skains in 2 weeks.  Schedulers will call you to arrange.      Increase activity slowly   Complete by: As directed      Allergies  Allergen Reactions   Codeine Other (See Comments)    Chest pain   Ciprofloxacin     Can not take    Quinolones     PATIENT UNAWARE OF allergy to this class of antibiotics, only aware of allergy to [facility] Patient was warned about not using Cipro and similar antibiotics. Recent studies have raised concern that fluoroquinolone antibiotics could be associated  with an increased risk of aortic aneurysm Fluoroquinolones have non-antimicrobial properties that might jeopardise the integrity of the extracellular matrix of the vascular wall In a  propensity score matched cohort study in [place], there was a 66% increased rate   Aleve [Naproxen Sodium] Hives, Rash and Other (See Comments)    headaches   Lorazepam Other (See Comments)    "Feels loopy"   Oxycodone Other (See Comments)    MENTAL CHANGES   Paregoric Other (See Comments)    Chest pain---tolerated MORPHINE   Penicillins Hives    Can take Augmentin      Allergies as of 04/09/2023       Reactions   Codeine Other (See Comments)   Chest pain   Ciprofloxacin    Can not take    Quinolones    PATIENT UNAWARE OF allergy to this class of antibiotics, only aware of allergy to [facility] Patient was warned about not using Cipro and similar antibiotics. Recent studies have raised concern that fluoroquinolone antibiotics could be associated with an increased risk of aortic aneurysm Fluoroquinolones have non-antimicrobial properties that might jeopardise the integrity of the extracellular matrix of the vascular wall In a  propensity score matched cohort study in [place], there was a 66% increased rate   Aleve [naproxen Sodium] Hives, Rash, Other (See Comments)   headaches   Lorazepam Other (See Comments)   "Feels loopy"   Oxycodone Other (See Comments)   MENTAL CHANGES   Paregoric Other (See Comments)   Chest pain---tolerated MORPHINE   Penicillins Hives   Can take Augmentin        Medication List     TAKE these medications    acetaminophen 500 MG tablet Commonly known as: TYLENOL Take 500 mg by mouth every 6 (six) hours as needed for moderate pain (pain score 4-6).   albuterol 108 (90 Base) MCG/ACT inhaler Commonly known as: VENTOLIN HFA TAKE 2 PUFFS BY MOUTH EVERY 6 HOURS AS NEEDED FOR WHEEZE OR SHORTNESS OF BREATH   allopurinol 100 MG tablet Commonly known as: ZYLOPRIM TAKE 2 TABLETS BY MOUTH EVERY DAY   ARIPiprazole 2 MG tablet Commonly known as: ABILIFY TAKE 1 TABLET BY MOUTH EVERY DAY   aspirin EC 81 MG tablet Take 81 mg by mouth daily. Swallow whole.   bismuth subsalicylate 262 MG chewable tablet Commonly known as: PEPTO BISMOL Chew 262 mg by mouth as needed for indigestion or diarrhea or loose stools. **capsule**   calcium carbonate 500 MG chewable tablet Commonly known as: TUMS - dosed in mg elemental calcium Chew 1 tablet by mouth as needed for indigestion or heartburn.   carboxymethylcellulose 0.5 %  Soln Commonly known as: REFRESH PLUS Place 1 drop into both eyes 3 (three) times daily as needed (Dry Eyes).   carvedilol 6.25 MG tablet Commonly known as: COREG Take 1 tablet (6.25 mg total) by mouth 2 (two) times daily.   CENTRUM SILVER PO Take 1 tablet by mouth daily.   clopidogrel 75 MG tablet Commonly known as: PLAVIX Take 1 tablet (75 mg total) by mouth daily.   colestipol 1 g tablet Commonly known as: COLESTID TAKE 1 TABLET BY MOUTH 2 TIMES DAILY.   famotidine 20 MG tablet Commonly known as: PEPCID TAKE 1 TABLET BY MOUTH TWICE A DAY   ferrous sulfate 325 (65 FE) MG tablet Take 325 mg by mouth daily with breakfast.   fluocinonide 0.05 % external solution Commonly known as: LIDEX Apply 1 Application topically daily as needed (scalp).  furosemide 20 MG tablet Commonly known as: LASIX TAKE 1 TABLET BY MOUTH EVERY DAY   lisinopril 10 MG tablet Commonly known as: ZESTRIL Take 1 tablet (10 mg total) by mouth daily.   Mounjaro 5 MG/0.5ML Pen Generic drug: tirzepatide Inject 5 mg into the skin once a week.   oxybutynin 10 MG 24 hr tablet Commonly known as: DITROPAN-XL TAKE 1 TABLET BY MOUTH EVERY DAY   pantoprazole 40 MG tablet Commonly known as: Protonix Take 1 tablet (40 mg total) by mouth 2 (two) times daily before a meal.   potassium chloride 10 MEQ tablet Commonly known as: KLOR-CON TAKE 1 TABLET BY MOUTH EVERY DAY   pregabalin 25 MG capsule Commonly known as: LYRICA Take 1 capsule (25 mg total) by mouth 2 (two) times daily.   rosuvastatin 20 MG tablet Commonly known as: CRESTOR Take 1 tablet (20 mg total) by mouth daily.   traMADol 50 MG tablet Commonly known as: ULTRAM Take 1 tablet (50 mg total) by mouth every 8 (eight) hours as needed for up to 5 days for severe pain (pain score 7-10).   Venlafaxine HCl 225 MG Tb24 TAKE 1 TABLET (225 MG TOTAL) BY MOUTH DAILY. PLEASE SCHEDULE OFFICE VISIT BEFORE ANY FUTURE REFILLS   VITAMIN D-3 PO Take 1  tablet by mouth daily.         Follow-up Information :  Julieanne Cotton, MD Follow up.  Specialties: Interventional Radiology, Radiology Why: Schedulers will call to arrange Contact information: [address] [place] [address] [phone]     Electronically Signed: Sable Feil, PA-C 04/09/2023, 2:18 PM

## 2023-04-09 NOTE — Discharge Instructions (Addendum)
 Activity Rest today, increase activity slowly. Continue taking Plavix 75 mg once daily. Continue taking Aspirin 81 mg once daily. No bending, stooping, or lifting more than 10 pounds for 2 weeks. No heavy coughing while turning head for 2 weeks.  No driving self for 2 weeks. Stay hydrated by drinking plenty of water.   PLAN  - Discharge today - Continue Plavix 75 mg qd, ASA 81 mg qd  - Schedulers will contact you for follow up appointment with Natasha Vasquez in approximately 2 weeks.

## 2023-04-09 NOTE — Telephone Encounter (Unsigned)
 Copied from CRM [id number]. Topic: Clinical - Home Health Verbal Orders >> Apr 09, 2023 12:52 PM Haroldine Laws wrote: Reason for CRM: terri with [facility] rehab called saying they got the orders for PT, Speech but missing the order for OT evaluation.

## 2023-04-10 LAB — POCT ACTIVATED CLOTTING TIME
Activated Clotting Time: 153 s
Activated Clotting Time: 199 s

## 2023-04-17 NOTE — Telephone Encounter (Signed)
 Reviewed. Patient's hip pain was addressed at visit at the time of symptoms. Will forward to PCP for follow up as scheduled

## 2023-04-23 ENCOUNTER — Ambulatory Visit
Admission: RE | Admit: 2023-04-23 | Discharge: 2023-04-23 | Disposition: A | Discharge status: Home / Self Care | Source: Ambulatory Visit | Attending: Thoracic Surgery (Cardiothoracic Vascular Surgery) | Admitting: Thoracic Surgery (Cardiothoracic Vascular Surgery)

## 2023-04-23 ENCOUNTER — Telehealth: Payer: Self-pay

## 2023-04-23 DIAGNOSIS — I7121 Aneurysm of the ascending aorta, without rupture: Secondary | ICD-10-CM

## 2023-04-23 NOTE — Telephone Encounter (Signed)
 Copied from CRM [id number]. Topic: Clinical - Medical Advice >> Apr 22, 2023  1:17 PM Pierre Bali B wrote: Reason for CRM: pt had a stroke on Feb 25, 2023  and was wondering if she needs to follow up with her PCP or not . She feels like she should have had a follow up or have someone call her after she had a stroke and its about to be 2 months now . Declined making an appt just would like to speak with Nurse or Dr. Beryle Flock. If someone can please give her a call would be great .

## 2023-04-25 NOTE — Telephone Encounter (Signed)
 I did see her after her stroke on 2/28. We also have a follow up later this month scheduled. Please ask if there are any mental status changes or confusion when you call her back

## 2023-04-25 NOTE — Telephone Encounter (Signed)
 Ok that makes more sense.  I'll see her on 4/29

## 2023-04-26 NOTE — Progress Notes (Unsigned)
 Cardiology Clinic Note   Date: 04/29/2023 ID: Natasha Vasquez, DOB 1940-03-04, MRN [id number]  Primary Cardiologist:  Belva Boyden, MD  Chief Complaint   Natasha Vasquez Natasha Vasquez is a 83 y.o. female who presents to the clinic today for routine follow up.   Patient Profile   Natasha Vasquez is followed by Dr. Gollan for the history outlined below.      Past medical history significant for: Dyspnea. Coronary CTA 06/01/2021: Calcium score of 0, no evidence of CAD. Echo 02/26/2023: EF 55 to 60%.  No RWMA.  Mild asymmetric LVH of the septal segment.  Grade I DD.  Normal RV size/function.  Normal PA pressure, RVSP 30.9 mmHg.  Aortic valve sclerosis without stenosis.  Bubble study negative for interatrial shunt. PAD. Carotid artery stenosis. CTA head and neck 02/25/2023: Calcified plaque at the left carotid bifurcation and ICA bulb. Minimal diameter in the ICA bulb less than 1 mm. Near occlusion. Vessel regains a more normal caliber at the distal bulb and is patent through the cervical region to the skull base beyond that. This is of particular importance in this patient as the left internal carotid artery supplies the majority of the brain, as the right ICA essentially gives isolated supply to the right middle cerebral artery territory only. Urgent vascular surgery consultation suggested. Stent placement left ICA 04/08/2023. Aneurysm of ascending thoracic aorta. CT chest 04/23/2023: Unchanged ascending thoracic aortic aneurysm 4.9 x 4.5 cm when compared to 08/13/2022 (although gradually increasing in size from 4.4 x 4.3 cm January 2020).  Recommend semiannual surveillance and follow-up with CT surgery. Hypertension. Hyperlipidemia. Lipid panel 05/04/2021: LDL 104, HDL 40, TG 224, total 189. Emphysema. OSA. CPAP 100% adherence.  GERD. T2DM. Subclinical hypothyroidism. Right breast cancer. S/p mastectomy. CVA. MR brain 02/25/2023: Acute infarction within the left midbrain and left cerebral  hemispheric white matter. Mild chronic small vessel ischemic change of the cerebral hemispheric white matter.   In summary, patient was first evaluated by Dr. Gollan on 05/29/2021 for shortness of breath at the request of Dr. Viva Grise.  Echo in March 2023 showed EF 60 to 65%, moderate LVH, Grade I DD, normal RV size/function, no significant valvular abnormalities, mild dilatation of aortic root 39 mm.  She underwent coronary CTA in May 2023 to further evaluate shortness of breath and this showed a calcium score of 0 with no evidence of CAD.   Patient was last seen in the office by me on 01/29/2023 for routine follow up. She reported continued dyspnea and voiced frustration over all testing coming back normal and still having symptoms. She reported dyspnea getting so bad she had to take a break between emptying top and bottom of the dishwasher. She reported mild improvement after starting CPAP. She reported typically high BP in the 150s/90. BP well controlled at the time of her visit. Toprol was stopped and she was started on carvedilol.   Patient presented to the ED on 02/25/2023 for weakness and shortness of breath. She reported she felt like she had a stroke the night before. She complained of right sided weakness and feeling off balance.  CTA head and neck demonstrated near occlusion of left ICA as detailed above.  MRI brain showed acute infarction within the left midbrain and left cerebral hemispheric white matter as detailed above.  Patient was discharged on 03/01/2023 to follow-up with neurology and neurointerventional radiology as an outpatient.  She underwent stent placement to left ICA on 04/08/2023.     History of Present  Illness    Today, patient is accompanied by her husband. She is doing well since hospital discharge. She has some residual right sided weakness since her stroke. She is working with PT with good tolerance. She continues to have stable dyspnea that is unchanged from previous. She  denies chest pain, pressure or tightness. No lower extremity edema, orthopnea or PND. She reports improved BP control since changing to carvedilol. She states home BP can still be "all over the place" but the majority of the time it is in the normal range. She reports some positional dizziness that she manages with slow position changes and "getting my sea legs" before walking.     ROS: All other systems reviewed and are otherwise negative except as noted in History of Present Illness.  EKGs/Labs Reviewed        02/25/2023: ALT 40; AST 34 04/09/2023: BUN 13; Creatinine, Ser 0.93; Potassium 3.9; Sodium 139   04/09/2023: Hemoglobin 10.3; WBC 8.8   02/25/2023: TSH 3.325    Physical Exam    VS:  BP 106/80   Pulse 82   Ht 5\' 5"  (1.651 m)   Wt 183 lb (83 kg)   SpO2 98%   BMI 30.45 kg/m  , BMI Body mass index is 30.45 kg/m.  GEN: Well nourished, well developed, in no acute distress. Neck: No JVD or carotid bruits. Cardiac:  RRR. No murmurs. No rubs or gallops.   Respiratory:  Respirations regular and unlabored. Clear to auscultation without rales, wheezing or rhonchi. GI: Soft, nontender, nondistended. Extremities: Radials/DP/PT 2+ and equal bilaterally. No clubbing or cyanosis. No edema.  Skin: Warm and dry, no rash. Neuro: Strength intact.  Assessment & Plan   Dyspnea Echo February 2025 showed normal LV/RV function, mild LVH, Grade I DD, no significant valvular abnormalities.  Coronary CTA May 2023 showed calcium score of 0 with no CAD. Patient reports continued DOE unchanged from previous. She is tolerating working with PT well.  -No further testing indicated at this time.  -Continue lisinopril, carvedilol, Lasix.   Aneurysm of ascending thoracic aorta CT chest April 2025 showed unchanged ascending thoracic aortic aneurysm 4.9 x 4.5 cm when compared to July 2024.  Semiannual surveillance and follow-up with CT surgery was recommended.  Patient denies chest pain. -Continue to  follow with CT surgery.  -Continue routine surveillance.  Carotid artery disease Patient with recent stroke February 2025 and was found to have near occlusion of left ICA.  She underwent stent placement on 04/08/2023.  Patient reports positional dizziness on occasion. No presyncope or syncope.  -Continue aspirin, Plavix, rosuvastatin. -Continue to follow with vascular surgery.  History of stroke Patient with recent stroke February 2025.  S/p stent placement left ICA March 2025. She reports residual right sided weakness. She is working with PT with good tolerance.  -Continue aspirin, Plavix, rosuvastatin. -Continue to follow with neurology.   Hypertension BP today 106/80. Occasionally high readings at home but mostly in the normal range.   -Continue lisinopril, carvedilol.  Disposition: Return in 6 months or sooner as needed.   Signed, Natasha Vasquez. Natasha Vanbergen, DNP, NP-C

## 2023-04-29 ENCOUNTER — Encounter: Payer: Self-pay | Admitting: Student

## 2023-04-29 ENCOUNTER — Ambulatory Visit: Payer: Medicare PPO | Attending: Student | Admitting: Student

## 2023-04-29 VITALS — BP 106/80 | HR 82 | Ht 65.0 in | Wt 183.0 lb

## 2023-04-29 DIAGNOSIS — I7121 Aneurysm of the ascending aorta, without rupture: Secondary | ICD-10-CM

## 2023-04-29 DIAGNOSIS — I10 Essential (primary) hypertension: Secondary | ICD-10-CM

## 2023-04-29 DIAGNOSIS — R0609 Other forms of dyspnea: Secondary | ICD-10-CM | POA: Diagnosis not present

## 2023-04-29 DIAGNOSIS — I6930 Unspecified sequelae of cerebral infarction: Secondary | ICD-10-CM | POA: Diagnosis not present

## 2023-04-29 DIAGNOSIS — I6522 Occlusion and stenosis of left carotid artery: Secondary | ICD-10-CM

## 2023-04-29 DIAGNOSIS — R531 Weakness: Secondary | ICD-10-CM

## 2023-04-29 NOTE — Patient Instructions (Signed)
 Medication Instructions:  Your physician recommends that you continue on your current medications as directed. Please refer to the Current Medication list given to you today.  *If you need a refill on your cardiac medications before your next appointment, please call your pharmacy*  Lab Work: No labs ordered today  If you have labs (blood work) drawn today and your tests are completely normal, you will receive your results only by: MyChart Message (if you have MyChart) OR A paper copy in the mail If you have any lab test that is abnormal or we need to change your treatment, we will call you to review the results.  Testing/Procedures: No test ordered today   Follow-Up: At [facility], you and your health needs are our priority.  As part of our continuing mission to provide you with exceptional heart care, our providers are all part of one team.  This team includes your primary Cardiologist (physician) and Advanced Practice Providers or APPs (Physician Assistants and Nurse Practitioners) who all work together to provide you with the care you need, when you need it.  Your next appointment:   6 month(s)  Provider:   You may see Julien Nordmann, MD or one of the following Advanced Practice Providers on your designated Care Team:   Nicolasa Ducking, NP Ames Dura, PA-C Eula Listen, PA-C Cadence Luther, PA-C Charlsie Quest, NP Carlos Levering, NP

## 2023-04-30 ENCOUNTER — Ambulatory Visit: Admitting: Surgical

## 2023-04-30 VITALS — BP 105/72 | HR 99 | Resp 20 | Ht 65.0 in | Wt 183.0 lb

## 2023-04-30 DIAGNOSIS — I7121 Aneurysm of the ascending aorta, without rupture: Secondary | ICD-10-CM | POA: Diagnosis not present

## 2023-04-30 NOTE — Progress Notes (Signed)
 [address]       [place] [address]             [phone]        Natasha Vasquez [id number] [birth year]   History of Present Illness: Patient is an 83 year old female we continue to monitor with ongoing surveillance of her ascending thoracic aortic aneurysm.  Since last seen she has not had a stroke and carotid stenting and appears to have made a good recovery.  She uses a walker to ambulate.  She denies chest pain other than some occasional musculoskeletal in nature.  She continues to have some difficulty with shortness of breath which has been an ongoing issue for which she has seen both cardiology and pulmonology.  She has been placed on 1 L of oxygen at night.  Her scan in September 2024 showed aneurysm to measure 47 mm in diameter and the most recent scan now shows it to be in the 48 to 49 mm range.  She reports that her blood pressure has been under good control and it is well-controlled today.  She has multiple chronic medical problems and is uncertain as to whether she would be a operative candidate she had this aneurysm enlarged to the level that would typically necessitate surgery.    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  ER/PR NEGATIVE   Cataract    Chronic cough    Complex sleep apnea syndrome    AVAPS   Complication of anesthesia    20 years ago pts b/p dropped really low, "coded"   Depression    Depression    Diabetes mellitus without complication (HCC)    diet controlled   Family history of adverse reaction to anesthesia    son PONV   GERD (gastroesophageal reflux disease)    Gout    Hypercholesteremia    Hyperlipidemia    Hypertension    Melanoma (HCC)    MVP (mitral valve prolapse)    followed by PCP   Nephritis    Nephritis    S/P appendectomy    Stroke [facility])     Thoracic aortic aneurysm (HCC)    Torn meniscus    left     Past Surgical History:  Procedure Laterality Date   ABDOMINAL HYSTERECTOMY     APPENDECTOMY     BREAST BIOPSY Right 04/25/2017   retroareolar 10:00   wing clip   path pending   BREAST BIOPSY Right 04/25/2017   10:00  5CMFN  venus clip    path pending   BREAST BIOPSY Right 05/03/2017   Affirm Bx- path pending   BREAST CYST ASPIRATION Bilateral    NEG   BREAST EXCISIONAL BIOPSY Left 20+ yrs ago   NEG   BREAST RECONSTRUCTION WITH PLACEMENT OF TISSUE EXPANDER AND FLEX HD (ACELLULAR HYDRATED DERMIS) Right 06/03/2017   Procedure: BREAST RECONSTRUCTION WITH PLACEMENT OF TISSUE EXPANDER AND FLEX HD (ACELLULAR HYDRATED DERMIS);  Surgeon: Peggye Form, DO;  Location: [facility] ORS;  Service: Plastics;  Laterality: Right;   BREAST REDUCTION SURGERY Left 11/06/2017   Procedure: BREAST REDUCTION;  Surgeon: Peggye Form, DO;  Location: [facility] OR[facility];  Service: Plastics;  Laterality: Left;   BROW LIFT Bilateral 02/01/2015   Procedure: BLEPHAROPLASTY bilateral upper eyelids.;  Surgeon: Imagene Riches, MD;  Location: [facility];  Service: Ophthalmology;  Laterality: Bilateral;  DIABETIC - diet controlled   CATARACT EXTRACTION W/PHACO Left 01/14/2019   Procedure: CATARACT EXTRACTION PHACO AND INTRAOCULAR LENS PLACEMENT (IOC) LEFT TECNIS TORIC ADD 5.62 00:46.5 30.0%;  Surgeon: Lockie Mola, MD;  Location: [facility];  Service: Ophthalmology;  Laterality: Left;  diabetic - diet controlled   CATARACT EXTRACTION W/PHACO Right 02/11/2019   Procedure: CATARACT EXTRACTION PHACO AND INTRAOCULAR LENS PLACEMENT (IOC) RIGHT DIABETIC 4.82 00:46.9 10.3%;  Surgeon: Lockie Mola, MD;  Location: [facility];  Service: Ophthalmology;  Laterality: Right;  Diabetic - diet controlled   CHOLECYSTECTOMY     COLONOSCOPY     COLONOSCOPY WITH PROPOFOL N/A 05/11/2015   Procedure: COLONOSCOPY WITH PROPOFOL;  Surgeon: Scot Jun, MD;  Location: [facility] ENDOSCOPY;  Service: Endoscopy;  Laterality: N/A;   COLONOSCOPY WITH PROPOFOL N/A 09/07/2020   Procedure: COLONOSCOPY WITH PROPOFOL;  Surgeon: Toledo, Boykin Nearing, MD;  Location: [facility] ENDOSCOPY;  Service: Gastroenterology;  Laterality: N/A;   ESOPHAGOGASTRODUODENOSCOPY     ESOPHAGOGASTRODUODENOSCOPY (EGD) WITH PROPOFOL  05/11/2015   Procedure: ESOPHAGOGASTRODUODENOSCOPY (EGD) WITH PROPOFOL;  Surgeon: Scot Jun, MD;  Location: [facility] ENDOSCOPY;  Service: Endoscopy;;   EYE SURGERY Bilateral    cataracts   FRACTURE SURGERY Right    arm and shoulder   IR ANGIO INTRA EXTRACRAN SEL COM CAROTID INNOMINATE BILAT MOD SED  02/27/2023   IR ANGIO INTRA EXTRACRAN SEL INTERNAL CAROTID UNI L MOD SED  04/08/2023   IR ANGIO VERTEBRAL SEL VERTEBRAL UNI R MOD SED  02/27/2023   IR CT HEAD LTD  04/08/2023   IR INTRA CRAN STENT  04/08/2023   IR RADIOLOGIST EVAL & MGMT  03/15/2023   IR US GUIDE VASC ACCESS RIGHT  02/27/2023   JOINT REPLACEMENT     KNEE ARTHROSCOPY Left 03/14/2016   Procedure: ARTHROSCOPY KNEE, PARTIAL MEDIAL MENISECTOMY, CHONDROPLASTY;  Surgeon: Donato Heinz, MD;  Location: [facility];  Service: Orthopedics;  Laterality: Left;   MASTECTOMY Right 2019   MASTECTOMY W/ SENTINEL NODE BIOPSY Right 06/03/2017   Procedure: MASTECTOMY WITH SENTINEL LYMPH NODE BIOPSY;  Surgeon: Earline Mayotte, MD;  Location: [facility];  Service: General;  Laterality: Right;   PARTIAL HIP ARTHROPLASTY Right    RADIOLOGY WITH ANESTHESIA N/A 04/08/2023   Procedure: RADIOLOGY WITH ANESTHESIA;  Surgeon: Julieanne Cotton, MD;  Location: [facility] OR;  Service: Radiology;  Laterality: N/A;  Cerebral angioplasty with stenting   REDUCTION MAMMAPLASTY Left 2019   REMOVAL OF BILATERAL TISSUE EXPANDERS WITH PLACEMENT OF BILATERAL BREAST IMPLANTS Right 11/06/2017   Procedure: REMOVAL OF TISSUE EXPANDER WITH PLACEMENT OF BREAST IMPLANT;  Surgeon: Peggye Form, DO;  Location: [facility] OR[facility];  Service:  Plastics;  Laterality: Right;   TONSILLECTOMY       Allergies  Allergen Reactions   Codeine Other (See Comments)    Chest pain   Ciprofloxacin     Can not take    Quinolones     PATIENT UNAWARE OF allergy to this class of antibiotics, only aware of allergy to [facility] Patient was warned about not using Cipro and similar antibiotics. Recent studies have raised concern that fluoroquinolone antibiotics could be associated with an increased risk of aortic aneurysm Fluoroquinolones have non-antimicrobial properties that might jeopardise the integrity of the extracellular matrix of the vascular wall In a  propensity score matched cohort study in [place], there was a 66% increased rate   Aleve [Naproxen Sodium]  Hives, Rash and Other (See Comments)    headaches   Lorazepam Other (See Comments)    "Feels loopy"   Oxycodone Other (See Comments)    MENTAL CHANGES   Paregoric Other (See Comments)    Chest pain---tolerated MORPHINE   Penicillins Hives    Can take Augmentin    Social History   Occupational History   Occupation: retired  Tobacco Use   Smoking status: Former    Current packs/day: 0.00    Types: Cigarettes    Start date: 1963    Quit date: 1965    Years since quitting: 60.3   Smokeless tobacco: Never   Tobacco comments:    smoked in college--1 pack would last one week.  Vaping Use   Vaping status: Never Used  Substance and Sexual Activity   Alcohol use: Not Currently    Alcohol/week: 7.0 - 14.0 standard drinks of alcohol    Types: 7 - 14 Glasses of wine per week    Comment: 1-2 glasses of wine per night   Drug use: No   Sexual activity: Never       Current Outpatient Medications on File Prior to Visit  Medication Sig Dispense Refill   acetaminophen (TYLENOL) 500 MG tablet Take 500 mg by mouth every 6 (six) hours as needed for moderate pain (pain score 4-6).     albuterol (VENTOLIN HFA) 108 (90 Base) MCG/ACT inhaler TAKE 2 PUFFS BY MOUTH EVERY 6 HOURS AS NEEDED FOR  WHEEZE OR SHORTNESS OF BREATH 8.5 each 2   allopurinol (ZYLOPRIM) 100 MG tablet TAKE 2 TABLETS BY MOUTH EVERY DAY 180 tablet 1   ARIPiprazole (ABILIFY) 2 MG tablet TAKE 1 TABLET BY MOUTH EVERY DAY 90 tablet 1   aspirin EC 81 MG tablet Take 81 mg by mouth daily. Swallow whole.     bismuth subsalicylate (PEPTO BISMOL) 262 MG chewable tablet Chew 262 mg by mouth as needed for indigestion or diarrhea or loose stools. **capsule**     calcium carbonate (TUMS - DOSED IN MG ELEMENTAL CALCIUM) 500 MG chewable tablet Chew 1 tablet by mouth as needed for indigestion or heartburn.     carboxymethylcellulose (REFRESH PLUS) 0.5 % SOLN Place 1 drop into both eyes 3 (three) times daily as needed (Dry Eyes).     carvedilol (COREG) 6.25 MG tablet Take 1 tablet (6.25 mg total) by mouth 2 (two) times daily. 180 tablet 3   Cholecalciferol (VITAMIN D-3 PO) Take 1 tablet by mouth daily.     clopidogrel (PLAVIX) 75 MG tablet Take 1 tablet (75 mg total) by mouth daily. 90 tablet 1   colestipol (COLESTID) 1 g tablet TAKE 1 TABLET BY MOUTH 2 TIMES DAILY. 180 tablet 5   famotidine (PEPCID) 20 MG tablet TAKE 1 TABLET BY MOUTH TWICE A DAY 180 tablet 0   ferrous sulfate 325 (65 FE) MG tablet Take 325 mg by mouth daily with breakfast.     fluocinonide (LIDEX) 0.05 % external solution Apply 1 Application topically daily as needed (scalp).     furosemide (LASIX) 20 MG tablet TAKE 1 TABLET BY MOUTH EVERY DAY 90 tablet 3   lisinopril (ZESTRIL) 10 MG tablet Take 1 tablet (10 mg total) by mouth daily. 90 tablet 1   Multiple Vitamins-Minerals (CENTRUM SILVER PO) Take 1 tablet by mouth daily.     oxybutynin (DITROPAN-XL) 10 MG 24 hr tablet TAKE 1 TABLET BY MOUTH EVERY DAY 90 tablet 1   pantoprazole (PROTONIX) 40 MG tablet Take 1  tablet (40 mg total) by mouth 2 (two) times daily before a meal. 180 tablet 1   potassium chloride (KLOR-CON) 10 MEQ tablet TAKE 1 TABLET BY MOUTH EVERY DAY 90 tablet 3   pregabalin (LYRICA) 25 MG capsule Take  1 capsule (25 mg total) by mouth 2 (two) times daily. 60 capsule 1   rosuvastatin (CRESTOR) 20 MG tablet Take 1 tablet (20 mg total) by mouth daily. 90 tablet 1   tirzepatide (MOUNJARO) 5 MG/0.5ML Pen Inject 5 mg into the skin once a week.     Venlafaxine HCl 225 MG TB24 TAKE 1 TABLET (225 MG TOTAL) BY MOUTH DAILY. PLEASE SCHEDULE OFFICE VISIT BEFORE ANY FUTURE REFILLS 90 tablet 0   No current facility-administered medications on file prior to visit.     There were no vitals taken for this visit.  Physical Exam Constitutional:      General: She is not in acute distress.    Appearance: She is not ill-appearing.  HENT:     Head: Normocephalic and atraumatic.  Eyes:     General: No scleral icterus. Neck:     Vascular: No carotid bruit.  Cardiovascular:     Rate and Rhythm: Normal rate and regular rhythm.     Heart sounds: Murmur heard.  Pulmonary:     Effort: Pulmonary effort is normal.     Breath sounds: Normal breath sounds.  Abdominal:     General: Bowel sounds are normal.     Palpations: Abdomen is soft.     Tenderness: There is no abdominal tenderness.  Musculoskeletal:     Right lower leg: No edema.     Left lower leg: No edema.  Skin:    General: Skin is warm and dry.     Capillary Refill: Capillary refill takes less than 2 seconds.     Coloration: Skin is not jaundiced or pale.  Neurological:     Mental Status: She is alert and oriented to person, place, and time.  Psychiatric:        Thought Content: Thought content normal.     CTA Results: Narrative & Impression  CLINICAL DATA:  Shortness of breath.  Follow-up aneurysm   EXAM: CT CHEST WITHOUT CONTRAST   TECHNIQUE: Multidetector CT imaging of the chest was performed following the standard protocol without IV contrast.   RADIATION DOSE REDUCTION: This exam was performed according to the departmental dose-optimization program which includes automated exposure control, adjustment of the mA and/or kV  according to patient size and/or use of iterative reconstruction technique.   COMPARISON:  CTA chest dated 08/13/2022 and multiple priors   FINDINGS: Cardiovascular: Normal heart size. No significant pericardial fluid/thickening. Ascending thoracic aorta measures 4.9 x 4.5 cm on multiplanar reformats, unchanged compared to 08/13/2022 (remeasured), although gradually increasing in size from 4.4 x 4.3 cm on 01/23/2018. Coronary artery calcifications and aortic atherosclerosis.   Mediastinum/Nodes: Imaged thyroid gland without nodules meeting criteria for imaging follow-up by size. Small hiatal hernia. No pathologically enlarged axillary, supraclavicular, mediastinal, or hilar lymph nodes.   Lungs/Pleura: The central airways are patent. Unchanged subsegmental branching right middle lobe endoluminal filling defect (5:102). 3 mm subpleural left upper lobe nodule (5:64) and basilar right lower lobe nodule (5:116), unchanged dating back to 03/07/2021, likely benign. No specific follow-up imaging recommended. Mild left lower lobe bronchiectasis. No pneumothorax. No pleural effusion.   Upper abdomen: Partially imaged similar mild intrahepatic bile duct dilation, which may be related to prior cholecystectomy.   Musculoskeletal: No acute or  abnormal lytic or blastic osseous lesions. Unchanged right breast implant.   IMPRESSION: 1. Unchanged ascending thoracic aortic aneurysm measuring 4.9 x 4.5 cm when compared to 08/13/2022, although gradually increasing in size from 4.4 x 4.3 cm on 01/23/2018. Ascending thoracic aortic aneurysm. Recommend semi-annual imaging followup by CTA or MRA and referral to cardiothoracic surgery if not already obtained. This recommendation follows 2010 ACCF/AHA/AATS/ACR/ASA/SCA/SCAI/SIR/STS/SVM Guidelines for the Diagnosis and Management of Patients With Thoracic Aortic Disease. Circulation. 2010; 121: R604-V409. Aortic aneurysm NOS (ICD10-I71.9) 2. Mild left  lower lobe bronchiectasis and unchanged subsegmental branching right middle lobe endoluminal filling defect, likely mucous plugging. 3. Aortic Atherosclerosis (ICD10-I70.0). Coronary artery calcifications. Assessment for potential risk factor modification, dietary therapy or pharmacologic therapy may be warranted, if clinically indicated.     Electronically Signed   By: Limin  Xu M.D.   On: 04/23/2023 16:16        ECHOCARDIOGRAM REPORT       Patient Name:   Natasha Vasquez Date of Exam: 02/26/2023  Medical Rec #:  [id number]         Height:       64.0 in  Accession #:    [id number]        Weight:       196.0 lb  Date of Birth:  [birth year]          BSA:          1.940 m  Patient Age:    82 years          BP:           174/94 mmHg  Patient Gender: F                 HR:           72 bpm.  Exam Location:  Inpatient   Procedure: 2D Echo, Cardiac Doppler, Color Doppler, Saline Contrast Bubble  Study            and Intracardiac Opacification Agent   Indications:    Stroke    History:        Patient has prior history of Echocardiogram examinations,  most                 recent 09/05/2022. Risk Factors:Hypertension, Diabetes,                  Dyslipidemia and Former Smoker.    Sonographer:    Reta Cassis  Referring Phys: [id number] PROSPER M AMPONSAH     Sonographer Comments: Technically difficult study due to poor echo windows  and patient is obese.  IMPRESSIONS     1. Left ventricular ejection fraction, by estimation, is 55 to 60%. The  left ventricle has normal function. The left ventricle has no regional  wall motion abnormalities. There is mild asymmetric left ventricular  hypertrophy of the septal segment. Left  ventricular diastolic parameters are consistent with Grade I diastolic  dysfunction (impaired relaxation).   2. Right ventricular systolic function is normal. The right ventricular  size is normal. There is normal pulmonary artery systolic pressure. The   estimated right ventricular systolic pressure is 30.9 mmHg.   3. The mitral valve is grossly normal. No evidence of mitral valve  regurgitation. No evidence of mitral stenosis.   4. The aortic valve is tricuspid. There is mild calcification of the  aortic valve. There is mild thickening of the aortic valve. Aortic valve  regurgitation is not visualized. Aortic valve sclerosis is  present, with  no evidence of aortic valve stenosis.   5. The inferior vena cava is normal in size with greater than 50%  respiratory variability, suggesting right atrial pressure of 3 mmHg.   6. Agitated saline contrast bubble study was negative, with no evidence  of any interatrial shunt.   Comparison(s): No significant change from prior study.   Conclusion(s)/Recommendation(s): No intracardiac source of embolism  detected on this transthoracic study. Consider a transesophageal  echocardiogram to exclude cardiac source of embolism if clinically  indicated.   FINDINGS   Left Ventricle: Left ventricular ejection fraction, by estimation, is 55  to 60%. The left ventricle has normal function. The left ventricle has no  regional wall motion abnormalities. Definity contrast agent was given IV  to delineate the left ventricular   endocardial borders. The left ventricular internal cavity size was normal  in size. There is mild asymmetric left ventricular hypertrophy of the  septal segment. Left ventricular diastolic parameters are consistent with  Grade I diastolic dysfunction  (impaired relaxation).   Right Ventricle: The right ventricular size is normal. No increase in  right ventricular wall thickness. Right ventricular systolic function is  normal. There is normal pulmonary artery systolic pressure. The tricuspid  regurgitant velocity is 2.64 m/s, and   with an assumed right atrial pressure of 3 mmHg, the estimated right  ventricular systolic pressure is 30.9 mmHg.   Left Atrium: Left atrial size was  normal in size.   Right Atrium: Right atrial size was normal in size.   Pericardium: There is no evidence of pericardial effusion. Presence of  epicardial fat layer.   Mitral Valve: The mitral valve is grossly normal. No evidence of mitral  valve regurgitation. No evidence of mitral valve stenosis.   Tricuspid Valve: The tricuspid valve is grossly normal. Tricuspid valve  regurgitation is trivial. No evidence of tricuspid stenosis.   Aortic Valve: The aortic valve is tricuspid. There is mild calcification  of the aortic valve. There is mild thickening of the aortic valve. Aortic  valve regurgitation is not visualized. Aortic valve sclerosis is present,  with no evidence of aortic valve  stenosis. Aortic valve mean gradient measures 6.0 mmHg. Aortic valve peak  gradient measures 10.9 mmHg. Aortic valve area, by VTI measures 2.64 cm.   Pulmonic Valve: The pulmonic valve was grossly normal. Pulmonic valve  regurgitation is not visualized. No evidence of pulmonic stenosis.   Aorta: The aortic root is normal in size and structure.   Venous: The inferior vena cava is normal in size with greater than 50%  respiratory variability, suggesting right atrial pressure of 3 mmHg.   IAS/Shunts: The atrial septum is grossly normal. Agitated saline contrast  was given intravenously to evaluate for intracardiac shunting. Agitated  saline contrast bubble study was negative, with no evidence of any  interatrial shunt.     LEFT VENTRICLE  PLAX 2D  LVIDd:         4.10 cm     Diastology  LVIDs:         2.70 cm     LV e' medial:    5.77 cm/s  LV PW:         0.90 cm     LV E/e' medial:  12.9  LV IVS:        1.20 cm     LV e' lateral:   9.68 cm/s  LVOT diam:     2.10 cm     LV E/e' lateral:  7.7  LV SV:         81  LV SV Index:   42  LVOT Area:     3.46 cm    LV Volumes (MOD)  LV vol d, MOD A2C: 83.3 ml  LV vol d, MOD A4C: 63.6 ml  LV vol s, MOD A2C: 30.2 ml  LV vol s, MOD A4C: 36.0 ml  LV  SV MOD A2C:     53.1 ml  LV SV MOD A4C:     63.6 ml  LV SV MOD BP:      38.7 ml   RIGHT VENTRICLE             IVC  RV Basal diam:  3.60 cm     IVC diam: 1.60 cm  RV S prime:     14.30 cm/s  TAPSE (M-mode): 2.6 cm   LEFT ATRIUM             Index        RIGHT ATRIUM           Index  LA diam:        3.90 cm 2.01 cm/m   RA Area:     17.60 cm  LA Vol (A2C):   94.1 ml 48.51 ml/m  RA Volume:   46.90 ml  24.18 ml/m  LA Vol (A4C):   43.1 ml 22.22 ml/m  LA Biplane Vol: 62.9 ml 32.43 ml/m   AORTIC VALVE  AV Area (Vmax):    2.62 cm  AV Area (Vmean):   2.65 cm  AV Area (VTI):     2.64 cm  AV Vmax:           165.00 cm/s  AV Vmean:          111.000 cm/s  AV VTI:            0.306 m  AV Peak Grad:      10.9 mmHg  AV Mean Grad:      6.0 mmHg  LVOT Vmax:         125.00 cm/s  LVOT Vmean:        84.900 cm/s  LVOT VTI:          0.233 m  LVOT/AV VTI ratio: 0.76    AORTA  Ao Root diam: 3.50 cm  Ao Asc diam:  3.30 cm   MITRAL VALVE               TRICUSPID VALVE  MV Area (PHT): 2.93 cm    TR Peak grad:   27.9 mmHg  MV Decel Time: 259 msec    TR Vmax:        264.00 cm/s  MV E velocity: 74.40 cm/s  MV A velocity: 89.60 cm/s  SHUNTS  MV E/A ratio:  0.83        Systemic VTI:  0.23 m                             Systemic Diam: 2.10 cm   Jackquelyn Mass MD  Electronically signed by Jackquelyn Mass MD  Signature Date/Time: 02/26/2023/10:56:16 AM       A/P she is stable in regards to her moderate-sized ascending thoracic aorta.  It is showing some degree of chronic enlargement with each visit.  We will continue surveillance and repeat a CTA in 6 months.      Risk Modification: Discussed routine risk modification for aneurysmal disease  and she has a good understanding as it relates to this as she has been seen for the past few years.  Statin: Yes  Smoking cessation instruction/counseling given: Remote smoker, quit many years ago  Patient was counseled on importance of Blood Pressure  Control.  Despite Medical intervention if the patient notices persistently elevated blood pressure readings.  They are instructed to contact their Primary Care Physician  Please avoid use of Fluoroquinolones as this can potentially increase your risk of Aortic Rupture and/or Dissection  Patient educated on signs and symptoms of Aortic Dissection, handout also provided in AVS  Lindi Revering, PA-C 04/30/23

## 2023-04-30 NOTE — Patient Instructions (Signed)
 Usual instructions as previously regarding activities associated with aneurysmal disease.

## 2023-05-02 ENCOUNTER — Ambulatory Visit (HOSPITAL_COMMUNITY)
Admission: RE | Admit: 2023-05-02 | Discharge: 2023-05-02 | Disposition: A | Discharge status: Home / Self Care | Source: Ambulatory Visit | Attending: Urology | Admitting: Urology

## 2023-05-02 DIAGNOSIS — I6522 Occlusion and stenosis of left carotid artery: Secondary | ICD-10-CM

## 2023-05-06 HISTORY — PX: IR RADIOLOGIST EVAL & MGMT: IMG5224

## 2023-05-11 LAB — BASIC METABOLIC PANEL WITH GFR
BUN/Creatinine Ratio: 12 (ref 12–28)
BUN: 12 mg/dL (ref 8–27)
CO2: 23 mmol/L (ref 20–29)
Calcium: 10 mg/dL (ref 8.7–10.3)
Chloride: 103 mmol/L (ref 96–106)
Creatinine, Ser: 0.97 mg/dL (ref 0.57–1.00)
Glucose: 106 mg/dL — ABNORMAL HIGH (ref 70–99)
Potassium: 4.2 mmol/L (ref 3.5–5.2)
Sodium: 142 mmol/L (ref 134–144)
eGFR: 58 mL/min/{1.73_m2} — ABNORMAL LOW (ref >59)

## 2023-05-13 ENCOUNTER — Encounter: Payer: Self-pay | Admitting: Family Medicine

## 2023-05-14 ENCOUNTER — Ambulatory Visit (INDEPENDENT_AMBULATORY_CARE_PROVIDER_SITE_OTHER): Payer: Self-pay | Admitting: Family Medicine

## 2023-05-14 ENCOUNTER — Encounter: Payer: Self-pay | Admitting: Neurology

## 2023-05-14 ENCOUNTER — Encounter: Payer: Self-pay | Admitting: Family Medicine

## 2023-05-14 ENCOUNTER — Ambulatory Visit: Payer: Medicare PPO | Admitting: Neurology

## 2023-05-14 VITALS — BP 110/70 | HR 64 | Ht 65.0 in | Wt 185.0 lb

## 2023-05-14 VITALS — BP 104/67 | HR 74 | Ht 65.0 in | Wt 186.8 lb

## 2023-05-14 DIAGNOSIS — Z6830 Body mass index (BMI) 30.0-30.9, adult: Secondary | ICD-10-CM

## 2023-05-14 DIAGNOSIS — I639 Cerebral infarction, unspecified: Secondary | ICD-10-CM | POA: Diagnosis not present

## 2023-05-14 DIAGNOSIS — Z0001 Encounter for general adult medical examination with abnormal findings: Secondary | ICD-10-CM

## 2023-05-14 DIAGNOSIS — Z0000 Encounter for general adult medical examination without abnormal findings: Secondary | ICD-10-CM

## 2023-05-14 DIAGNOSIS — E7849 Other hyperlipidemia: Secondary | ICD-10-CM

## 2023-05-14 DIAGNOSIS — I6381 Other cerebral infarction due to occlusion or stenosis of small artery: Secondary | ICD-10-CM | POA: Diagnosis not present

## 2023-05-14 DIAGNOSIS — E038 Other specified hypothyroidism: Secondary | ICD-10-CM

## 2023-05-14 DIAGNOSIS — E66811 Obesity, class 1: Secondary | ICD-10-CM

## 2023-05-14 DIAGNOSIS — I152 Hypertension secondary to endocrine disorders: Secondary | ICD-10-CM

## 2023-05-14 DIAGNOSIS — G4733 Obstructive sleep apnea (adult) (pediatric): Secondary | ICD-10-CM | POA: Insufficient documentation

## 2023-05-14 DIAGNOSIS — I6522 Occlusion and stenosis of left carotid artery: Secondary | ICD-10-CM | POA: Diagnosis not present

## 2023-05-14 DIAGNOSIS — E1142 Type 2 diabetes mellitus with diabetic polyneuropathy: Secondary | ICD-10-CM

## 2023-05-14 DIAGNOSIS — E1169 Type 2 diabetes mellitus with other specified complication: Secondary | ICD-10-CM

## 2023-05-14 DIAGNOSIS — M25551 Pain in right hip: Secondary | ICD-10-CM

## 2023-05-14 DIAGNOSIS — Z95820 Peripheral vascular angioplasty status with implants and grafts: Secondary | ICD-10-CM

## 2023-05-14 DIAGNOSIS — E1159 Type 2 diabetes mellitus with other circulatory complications: Secondary | ICD-10-CM

## 2023-05-14 DIAGNOSIS — I6930 Unspecified sequelae of cerebral infarction: Secondary | ICD-10-CM

## 2023-05-14 DIAGNOSIS — F331 Major depressive disorder, recurrent, moderate: Secondary | ICD-10-CM

## 2023-05-14 NOTE — Assessment & Plan Note (Signed)
 Obstructive sleep apnea is managed with CPAP and supplemental oxygen . She reports improved sleep quality with CPAP use. - Continue CPAP with 1 liter of oxygen 

## 2023-05-14 NOTE — Progress Notes (Signed)
 [facility] [address] [place]. [place] [address] [phone]       OFFICE FOLLOW-UP NOTE  Natasha. Natasha Vasquez Date of Birth:  10-06-1940 Medical Record Number:  [id number]   HPI: Natasha Vasquez is a 83 year old pleasant Caucasian lady seen today for first office f/u visit accompanied by her husband after recent hospital consult for stroke in Feb 2025.  She has past medical history of hypertension, diabetes, hyperlipidemia and gastroesophageal reflux disease.  She presented on 02/25/2023 to [facility] emergency room with sudden onset of incoordination of the right side.  She got up at 3:30 in the morning and ambulated all right to the bathroom.  When she woke up again at 730 she was unable to walk and right side was not working right.  CT scan showed age-indeterminate left superior cerebellar infarct.  She was not a TNK candidate as she was outside the window.  NIH stroke scale was 3 due to ataxia and dysarthria.  MRI scan of the brain showed a left midbrain and cerebral peduncle lacunar infarct.  CT angiogram showed near occlusive high-grade proximal left ICA stenosis which was asymptomatic.  Echocardiogram showed ejection fraction of 55 to 60%.  Left atrial size was normal.  LDL cholesterol 81 mg percent.  Hemoglobin A1c was 6.6.  Patient had a diagnostic cerebral catheter angiogram done on 02/27/2023 by Dr Alvira Josephs which confirmed near occlusive severe proximal left ICA stenosis.  Patient was transferred to inpatient rehab and did well and subsequently underwent elective left ICA angioplasty and stenting on 04/04/2023.  She states she did not extremely well.  She is able to ambulate with a walker and does well and has had no falls or injuries.  She is not steady while using her cane.  Her speech has recovered well though she has occasional hesitancy when she is tired.  She remains on aspirin  and Plavix  which she is tolerating well but does bruise easily.  She has had no bleeding episodes.   She is tolerating increased dose of Crestor  20 mg well without any side effects.  Her blood pressure is under good control today it is 110/70.  She is eating healthy and has lost some weight.  She has no complaints today.  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  ER/PR NEGATIVE   Cataract    Chronic cough    Complex sleep apnea syndrome    AVAPS   Complication of anesthesia    20 years ago pts b/p dropped really low, "coded"   Depression    Depression    Diabetes mellitus without complication (HCC)    diet controlled   Family history of adverse reaction to anesthesia    son PONV   GERD (gastroesophageal reflux disease)    Gout    Hypercholesteremia    Hyperlipidemia    Hypertension    Melanoma (HCC)    MVP (mitral valve prolapse)    followed by PCP   Nephritis    Nephritis    S/P appendectomy    Stroke [facility])    Thoracic aortic aneurysm (HCC)    Torn meniscus  left    Social History:  Social History   Socioeconomic History   Marital status: Married    Spouse name: Josefina Nian   Number of children: 2   Years of education: Not on file   Highest education level: Master's degree (e.g., MA, Natasha, MEng, MEd, MSW, MBA)  Occupational History   Occupation: retired  Tobacco Use   Smoking status: Former    Current packs/day: 0.00    Types: Cigarettes    Start date: 1963    Quit date: 1965    Years since quitting: 60.3   Smokeless tobacco: Never   Tobacco comments:    smoked in college--1 pack would last one week.  Vaping Use   Vaping status: Never Used  Substance and Sexual Activity   Alcohol use: Not Currently    Alcohol/week: 7.0 - 14.0 standard drinks of alcohol    Types:  7 - 14 Glasses of wine per week    Comment: 1-2 glasses of wine per night   Drug use: No   Sexual activity: Never  Other Topics Concern   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    Retired    Teacher, early years/pre Strain: Low Risk  (05/13/2023)   Overall Financial Resource Strain (CARDIA)    Difficulty of Paying Living Expenses: Not hard at all  Food Insecurity: No Food Insecurity (05/13/2023)   Hunger Vital Sign    Worried About Running Out of Food in the Last Year: Never true    Ran Out of Food in the Last Year: Never true  Transportation Needs: No Transportation Needs (05/13/2023)   PRAPARE - Administrator, Civil Service (Medical): No    Lack of Transportation (Non-Medical): No  Physical Activity: Unknown (05/13/2023)   Exercise Vital Sign    Days of Exercise per Week: 0 days    Minutes of Exercise per Session: Not on file  Stress: No Stress Concern Present (05/13/2023)   [employer] of Occupational Health - Occupational Stress Questionnaire    Feeling of Stress : Only a little  Social Connections: Socially Integrated (05/13/2023)   Social Connection and Isolation Panel [NHANES]    Frequency of Communication with Friends and Family: More than three times a week    Frequency of Social Gatherings with Friends and Family: Twice a week    Attends Religious Services: More than 4 times per year    Active Member of [employer] or Organizations: Yes    Attends Engineer, structural: More than 4 times per year    Marital Status: Married  Catering manager Violence: Patient Declined (04/08/2023)   Humiliation, Afraid, Rape, and Kick questionnaire    Fear of Current or Ex-Partner: Patient declined    Emotionally Abused: Patient declined    Physically Abused: Patient declined    Sexually Abused: Patient declined    Medications:   Current Outpatient  Medications on File Prior to Visit  Medication Sig Dispense Refill   acetaminophen  (TYLENOL ) 500 MG tablet Take 500 mg by mouth every 6 (six) hours as needed for moderate pain (pain score 4-6).     albuterol  (VENTOLIN  HFA) 108 (90 Base) MCG/ACT inhaler TAKE 2 PUFFS BY MOUTH EVERY 6 HOURS AS NEEDED FOR WHEEZE OR SHORTNESS OF BREATH 8.5 each 2   allopurinol  (ZYLOPRIM ) 100 MG tablet TAKE 2 TABLETS BY MOUTH EVERY DAY 180 tablet 1  ARIPiprazole  (ABILIFY ) 2 MG tablet TAKE 1 TABLET BY MOUTH EVERY DAY 90 tablet 1   aspirin  EC 81 MG tablet Take 81 mg by mouth daily. Swallow whole.     bismuth  subsalicylate (PEPTO BISMOL) 262 MG chewable tablet Chew 262 mg by mouth as needed for indigestion or diarrhea or loose stools. **capsule**     calcium  carbonate (TUMS - DOSED IN MG ELEMENTAL CALCIUM ) 500 MG chewable tablet Chew 1 tablet by mouth as needed for indigestion or heartburn.     carboxymethylcellulose (REFRESH PLUS) 0.5 % SOLN Place 1 drop into both eyes 3 (three) times daily as needed (Dry Eyes).     carvedilol  (COREG ) 6.25 MG tablet Take 1 tablet (6.25 mg total) by mouth 2 (two) times daily. 180 tablet 3   Cholecalciferol (VITAMIN D -3 PO) Take 1 tablet by mouth daily.     clopidogrel  (PLAVIX ) 75 MG tablet Take 1 tablet (75 mg total) by mouth daily. 90 tablet 1   colestipol  (COLESTID ) 1 g tablet TAKE 1 TABLET BY MOUTH 2 TIMES DAILY. 180 tablet 5   famotidine  (PEPCID ) 20 MG tablet TAKE 1 TABLET BY MOUTH TWICE A DAY 180 tablet 0   ferrous sulfate  325 (65 FE) MG tablet Take 325 mg by mouth daily with breakfast.     fluocinonide (LIDEX) 0.05 % external solution Apply 1 Application topically daily as needed (scalp).     furosemide  (LASIX ) 20 MG tablet TAKE 1 TABLET BY MOUTH EVERY DAY 90 tablet 3   lisinopril  (ZESTRIL ) 10 MG tablet Take 1 tablet (10 mg total) by mouth daily. 90 tablet 1   metoprolol  succinate (TOPROL -XL) 50 MG 24 hr tablet Take 25 mg by mouth daily.     Multiple Vitamins-Minerals (CENTRUM  SILVER PO) Take 1 tablet by mouth daily.     oxybutynin  (DITROPAN -XL) 10 MG 24 hr tablet TAKE 1 TABLET BY MOUTH EVERY DAY 90 tablet 1   pantoprazole  (PROTONIX ) 40 MG tablet Take 1 tablet (40 mg total) by mouth 2 (two) times daily before a meal. 180 tablet 1   potassium chloride  (KLOR-CON ) 10 MEQ tablet TAKE 1 TABLET BY MOUTH EVERY DAY 90 tablet 3   pregabalin  (LYRICA ) 25 MG capsule Take 1 capsule (25 mg total) by mouth 2 (two) times daily. 60 capsule 1   rosuvastatin  (CRESTOR ) 20 MG tablet Take 1 tablet (20 mg total) by mouth daily. 90 tablet 1   tirzepatide (MOUNJARO) 5 MG/0.5ML Pen Inject 5 mg into the skin once a week.     Venlafaxine  HCl 225 MG TB24 TAKE 1 TABLET (225 MG TOTAL) BY MOUTH DAILY. PLEASE SCHEDULE OFFICE VISIT BEFORE ANY FUTURE REFILLS 90 tablet 0   No current facility-administered medications on file prior to visit.    Allergies:   Allergies  Allergen Reactions   Codeine Other (See Comments)    Chest pain   Ciprofloxacin      Can not take    Quinolones     PATIENT UNAWARE OF allergy to this class of antibiotics, only aware of allergy to [facility] Patient was warned about not using Cipro  and similar antibiotics. Recent studies have raised concern that fluoroquinolone antibiotics could be associated with an increased risk of aortic aneurysm Fluoroquinolones have non-antimicrobial properties that might jeopardise the integrity of the extracellular matrix of the vascular wall In a  propensity score matched cohort study in [place], there was a 66% increased rate   Aleve [Naproxen Sodium] Hives, Rash and Other (See Comments)    headaches  Lorazepam Other (See Comments)    "Feels loopy"   Oxycodone  Other (See Comments)    MENTAL CHANGES   Paregoric Other (See Comments)    Chest pain---tolerated MORPHINE    Penicillins Hives    Can take Augmentin    Physical Exam General: well developed, well nourished pleasant elderly caucasian lady , seated, in no evident distress Head:  head normocephalic and atraumatic.  Neck: supple with no carotid or supraclavicular bruits Cardiovascular: regular rate and rhythm, no murmurs Musculoskeletal: no deformity Skin:  no rash but scattered petichiae bilateral forearms Vascular:  Normal pulses all extremities Vitals:   05/14/23 0938  BP: 110/70  Pulse: 64   Neurologic Exam Mental Status: Awake and fully alert. Oriented to place and time. Recent and remote memory intact. Attention span, concentration and fund of knowledge appropriate. Mood and affect appropriate.  Cranial Nerves: Fundoscopic exam reveals sharp disc margins. Pupils equal, briskly reactive to light. Extraocular movements full without nystagmus. Visual fields full to confrontation. Hearing intact. Facial sensation intact. Face, tongue, palate moves normally and symmetrically.  Motor: Normal bulk and tone. Normal strength in all tested extremity muscles.diminished fine finger movements on left and orbits right over left upper extremities Sensory.: intact to touch ,pinprick .position and vibratory sensation.  Coordination: Rapid alternating movements normal in all extremities. Finger-to-nose and heel-to-shin performed accurately bilaterally. Gait and Station: Arises from chair without difficulty. Stance is broad based . Gait mildy ataxic.uses a wheeled walker.. Unable  to heel, toe and tandem walk   Reflexes: 1+ and symmetric. Toes downgoing.   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  She is doing very well.  Vascular risk factors of hypertension, hyperlipidemia, diabetes and carotid stenosis   PLAN:I had a long d/w patient and her husband about her recent brainstem lacunar stroke, asymptomatic high-grade left carotid stenosis s/p  recent angioplasty stenting risk for recurrent stroke/TIAs, personally independently reviewed imaging studies and stroke evaluation results and answered questions.Continue aspirin  81 mg daily and  Plavix  (clopidogrel ) 75 mg daily  for secondary stroke prevention for 6 months and then discontinue aspirin  and stay on Plavix  alone and maintain strict control of hypertension with blood pressure goal below 130/90, diabetes with hemoglobin A1c goal below 6.5% and lipids with LDL cholesterol goal below 70 mg/dL. I also advised the patient to eat a healthy diet with plenty of whole grains, cereals, fruits and vegetables, exercise regularly and maintain ideal body weight.  She was encouraged to use a walker at all times and discussed fall safety precautions.  Check follow-up lipid profile today.  She will need follow-up carotid ultrasound at next visit for carotid stent surveillance.  Followup in the future with my nurse practitioner in 6 months or call earlier if necessary.  Greater than 50% of time during this 40 minute visit was spent on counseling,explanation of diagnosis stroke and asymptomatic carotid stenting, planning of further management, discussion with patient and family and coordination of care Ardella Beaver, MD Note: This document was prepared with digital dictation and possible smart phrase technology. Any transcriptional errors that result from this process are unintentional

## 2023-05-14 NOTE — Assessment & Plan Note (Signed)
 Depression is managed with Effexor  and Abilify . She reports reluctance to leave the house but is making efforts to do so. Mood is stable given recent health challenges. - Continue Effexor  - Continue Abilify 

## 2023-05-14 NOTE — Progress Notes (Signed)
 Complete physical exam   Patient: Natasha Vasquez   DOB: Sep 26, 1940   83 y.o. Female  MRN: [id number] Visit Date: 05/14/2023  Today's healthcare provider: Aden Agreste, MD   Chief Complaint  Patient presents with   Annual Exam    Diet -  General Exercise - Physical Therapy twice a week for one hour Feeling - well Sleeping - well Concerns - would like to know if she can continue taking pregablin    Subjective    Natasha Vasquez is a 83 y.o. female who presents today for a complete physical exam.   Discussed the use of AI scribe software for clinical note transcription with the patient, who gave verbal consent to proceed.  History of Present Illness   Natasha Vasquez "Natasha Vasquez" is an 83 year old female who presents for a wellness visit and follow-up on her hip pain and medication management.  She experiences significant improvement in hip pain with pregabalin  25 mg twice daily, now rarely experiencing pain. Previously, she had intense pain episodes every hour.  She has right-sided weakness from a stroke and is in physical and occupational therapy to improve strength and balance. She uses a walker for safety and has home modifications like a bed rail and commode bar.  Her blood pressure is well-controlled with carvedilol , furosemide , and lisinopril . She takes Crestor  for cholesterol and Effexor  and Abilify  for mood stabilization. She feels frustrated with her condition but is making efforts to be more active.  She uses a CPAP machine with oxygen  at night, which improves her sleep. She has lost 14 pounds, now weighing 185 pounds, attributed to Mounjaro for weight management and blood sugar control. Her last A1c was 6.6.        Last depression screening scores    05/14/2023    1:26 PM 03/15/2023   11:20 AM 05/01/2022    9:57 AM  PHQ 2/9 Scores  PHQ - 2 Score 2 1 3   PHQ- 9 Score 4 5 8    Last fall risk screening    05/01/2022    9:57 AM  Fall Risk   Falls in the  past year? 0  Number falls in past yr: 0  Injury with Fall? 0  Risk for fall due to : No Fall Risks  Follow up Falls evaluation completed        Medications: Outpatient Medications Prior to Visit  Medication Sig   acetaminophen  (TYLENOL ) 500 MG tablet Take 500 mg by mouth every 6 (six) hours as needed for moderate pain (pain score 4-6).   albuterol  (VENTOLIN  HFA) 108 (90 Base) MCG/ACT inhaler TAKE 2 PUFFS BY MOUTH EVERY 6 HOURS AS NEEDED FOR WHEEZE OR SHORTNESS OF BREATH   allopurinol  (ZYLOPRIM ) 100 MG tablet TAKE 2 TABLETS BY MOUTH EVERY DAY   ARIPiprazole  (ABILIFY ) 2 MG tablet TAKE 1 TABLET BY MOUTH EVERY DAY   aspirin  EC 81 MG tablet Take 81 mg by mouth daily. Swallow whole.   bismuth  subsalicylate (PEPTO BISMOL) 262 MG chewable tablet Chew 262 mg by mouth as needed for indigestion or diarrhea or loose stools. **capsule**   calcium  carbonate (TUMS - DOSED IN MG ELEMENTAL CALCIUM ) 500 MG chewable tablet Chew 1 tablet by mouth as needed for indigestion or heartburn.   carboxymethylcellulose (REFRESH PLUS) 0.5 % SOLN Place 1 drop into both eyes 3 (three) times daily as needed (Dry Eyes).   carvedilol  (COREG ) 6.25 MG tablet Take 1 tablet (6.25 mg total) by mouth 2 (two)  times daily.   Cholecalciferol (VITAMIN D -3 PO) Take 1 tablet by mouth daily.   clopidogrel  (PLAVIX ) 75 MG tablet Take 1 tablet (75 mg total) by mouth daily.   colestipol  (COLESTID ) 1 g tablet TAKE 1 TABLET BY MOUTH 2 TIMES DAILY.   famotidine  (PEPCID ) 20 MG tablet TAKE 1 TABLET BY MOUTH TWICE A DAY   ferrous sulfate  325 (65 FE) MG tablet Take 325 mg by mouth daily with breakfast.   fluocinonide (LIDEX) 0.05 % external solution Apply 1 Application topically daily as needed (scalp).   furosemide  (LASIX ) 20 MG tablet TAKE 1 TABLET BY MOUTH EVERY DAY   lisinopril  (ZESTRIL ) 10 MG tablet Take 1 tablet (10 mg total) by mouth daily.   metoprolol  succinate (TOPROL -XL) 50 MG 24 hr tablet Take 25 mg by mouth daily.   Multiple  Vitamins-Minerals (CENTRUM SILVER PO) Take 1 tablet by mouth daily.   oxybutynin  (DITROPAN -XL) 10 MG 24 hr tablet TAKE 1 TABLET BY MOUTH EVERY DAY   pantoprazole  (PROTONIX ) 40 MG tablet Take 1 tablet (40 mg total) by mouth 2 (two) times daily before a meal.   potassium chloride  (KLOR-CON ) 10 MEQ tablet TAKE 1 TABLET BY MOUTH EVERY DAY   pregabalin  (LYRICA ) 25 MG capsule Take 1 capsule (25 mg total) by mouth 2 (two) times daily.   rosuvastatin  (CRESTOR ) 20 MG tablet Take 1 tablet (20 mg total) by mouth daily.   tirzepatide (MOUNJARO) 5 MG/0.5ML Pen Inject 5 mg into the skin once a week.   Venlafaxine  HCl 225 MG TB24 TAKE 1 TABLET (225 MG TOTAL) BY MOUTH DAILY. PLEASE SCHEDULE OFFICE VISIT BEFORE ANY FUTURE REFILLS   No facility-administered medications prior to visit.    Review of Systems    Objective    BP 104/67 (BP Location: Left Arm, Patient Position: Sitting, Cuff Size: Normal)   Pulse 74   Ht 5\' 5"  (1.651 m)   Wt 186 lb 12.8 oz (84.7 kg)   SpO2 94%   BMI 31.09 kg/m    Physical Exam Vitals reviewed.  Constitutional:      General: She is not in acute distress.    Appearance: Normal appearance. She is well-developed. She is not diaphoretic.  HENT:     Head: Normocephalic and atraumatic.     Right Ear: Tympanic membrane, ear canal and external ear normal.     Left Ear: Tympanic membrane, ear canal and external ear normal.     Nose: Nose normal.     Mouth/Throat:     Mouth: Mucous membranes are moist.     Pharynx: Oropharynx is clear. No oropharyngeal exudate.  Eyes:     General: No scleral icterus.    Conjunctiva/sclera: Conjunctivae normal.     Pupils: Pupils are equal, round, and reactive to light.  Neck:     Thyroid : No thyromegaly.  Cardiovascular:     Rate and Rhythm: Normal rate and regular rhythm.     Heart sounds: Normal heart sounds. No murmur heard. Pulmonary:     Effort: Pulmonary effort is normal. No respiratory distress.     Breath sounds: Normal breath  sounds. No wheezing or rales.  Abdominal:     General: There is no distension.     Palpations: Abdomen is soft.     Tenderness: There is no abdominal tenderness.  Musculoskeletal:        General: No deformity.     Cervical back: Neck supple.     Right lower leg: No edema.  Left lower leg: No edema.  Lymphadenopathy:     Cervical: No cervical adenopathy.  Skin:    General: Skin is warm and dry.     Findings: No rash.  Neurological:     Mental Status: She is alert and oriented to person, place, and time.     Gait: Gait abnormal.  Psychiatric:        Mood and Affect: Mood normal.        Behavior: Behavior normal.        Thought Content: Thought content normal.      No results found for any visits on 05/14/23.  Assessment & Plan    Routine Health Maintenance and Physical Exam  Exercise Activities and Dietary recommendations  Goals      DIET - EAT MORE FRUITS AND VEGETABLES     DIET - INCREASE WATER INTAKE     Recommend to increase water intake to 6-8 8 oz glasses a day.      Exercise 3x per week (30 min per time)     Recommend to exercise for 3 days a week for at least 30 minutes at a time.          Immunization History  Administered Date(s) Administered   Fluad Quad(high Dose 65+) 09/09/2018, 10/12/2019, 10/14/2020, 09/25/2021   Influenza Split 10/24/2011   Influenza, High Dose Seasonal PF 11/18/2013, 01/24/2016, 10/08/2016, 09/12/2017, 10/12/2022   Influenza-Unspecified 10/24/2011, 10/14/2012, 11/18/2013   Moderna Sars-Covid-2 Vaccination 01/30/2019, 03/02/2019   Pfizer(Comirnaty)Fall Seasonal Vaccine 12 years and older 10/12/2022   Pneumococcal Conjugate-13 08/21/2013   Pneumococcal Polysaccharide-23 01/24/2016   Respiratory Syncytial Virus Vaccine,Recomb Aduvanted(Arexvy) 11/11/2021   Tdap 10/24/2011   Zoster Recombinant(Shingrix) 04/01/2016, 08/14/2016    Health Maintenance  Topic Date Due   DTaP/Tdap/Td (2 - Td or Tdap) 10/23/2021   OPHTHALMOLOGY  EXAM  11/22/2022   COVID-19 Vaccine (4 - Mixed Product risk 2024-25 season) 04/11/2023   Medicare Annual Wellness (AWV)  04/23/2023   HEMOGLOBIN A1C  08/11/2023   INFLUENZA VACCINE  08/16/2023   FOOT EXAM  02/11/2024   Diabetic kidney evaluation - Urine ACR  02/25/2024   Diabetic kidney evaluation - eGFR measurement  05/09/2024   DEXA SCAN  05/03/2025   Pneumonia Vaccine 21+ Years old  Completed   Zoster Vaccines- Shingrix  Completed   HPV VACCINES  Aged Out   Meningococcal B Vaccine  Aged Out    Discussed health benefits of physical activity, and encouraged her to engage in regular exercise appropriate for her age and condition.  Problem List Items Addressed This Visit       Cardiovascular and Mediastinum   Hypertension associated with diabetes (HCC)   Hypertension is well-controlled with current medication regimen. Blood pressure was 110 mmHg this morning. - Continue carvedilol  6.25 mg twice a day - Continue furosemide  20 mg daily - Continue lisinopril  10 mg daily        Respiratory   OSA on CPAP   Obstructive sleep apnea is managed with CPAP and supplemental oxygen . She reports improved sleep quality with CPAP use. - Continue CPAP with 1 liter of oxygen         Endocrine   Hyperlipidemia associated with type 2 diabetes mellitus (HCC)   Hyperlipidemia is managed with Crestor . Recent cholesterol levels were checked by Dr. Janett Medin. - Continue Crestor  20 mg daily      Subclinical hypothyroidism   Type 2 diabetes mellitus (HCC)   Type 2 diabetes is managed with Mounjaro. Previous A1c was 6.6%.  Goal is to reduce A1c to below 6.5% for stroke prevention. - Continue Mounjaro 5 mg weekly - Perform A1c test via finger prick        Other   Obesity   Moderate episode of recurrent major depressive disorder (HCC)   Depression is managed with Effexor  and Abilify . She reports reluctance to leave the house but is making efforts to do so. Mood is stable given recent health  challenges. - Continue Effexor  - Continue Abilify       History of cerebrovascular accident (CVA) with residual deficit   Right-sided weakness post-stroke Right-sided weakness persists post-stroke with improvements. She is undergoing physical and occupational therapy. Safety with mobility is a concern, and use of a walker is recommended. Recovery is ongoing, with potential for further improvement over 6-12 months. - Continue physical therapy - Continue occupational therapy - Use walker for mobility to prevent falls      Other Visit Diagnoses       Encounter for annual physical exam    -  Primary     Pain of right hip               Wellness Visit Routine wellness visit to review overall health and preventive care. No new concerns raised. - Perform A1c test via finger prick - Schedule six-month follow-up appointment  Hip pain Hip pain is well-managed with pregabalin . She reports significant improvement and reduced frequency of pain episodes. - Continue pregabalin  25 mg twice a day  General Health Maintenance Reviewed general health maintenance including vaccinations and screenings. Eye exam may be due if not done since November 2024. - Schedule eye exam if not done since November 2024 - Obtain tetanus booster at pharmacy - Plan for flu shot in the fall         Return in about 6 months (around 11/13/2023) for chronic disease f/u.     Aden Agreste, MD  [facility] [phone] (phone) [phone] (fax)  [facility]

## 2023-05-14 NOTE — Assessment & Plan Note (Signed)
 Type 2 diabetes is managed with Mounjaro. Previous A1c was 6.6%. Goal is to reduce A1c to below 6.5% for stroke prevention. - Continue Mounjaro 5 mg weekly - Perform A1c test via finger prick

## 2023-05-14 NOTE — Patient Instructions (Signed)
 I had a long d/w patient and her husband about her recent brainstem lacunar stroke, asymptomatic high-grade left carotid stenosis s/p recent angioplasty stenting risk for recurrent stroke/TIAs, personally independently reviewed imaging studies and stroke evaluation results and answered questions.Continue aspirin  81 mg daily and  Plavix  (clopidogrel ) 75 mg daily  for secondary stroke prevention for 6 months and then discontinue aspirin  and stay on Plavix  alone and maintain strict control of hypertension with blood pressure goal below 130/90, diabetes with hemoglobin A1c goal below 6.5% and lipids with LDL cholesterol goal below 70 mg/dL. I also advised the patient to eat a healthy diet with plenty of whole grains, cereals, fruits and vegetables, exercise regularly and maintain ideal body weight.  She was encouraged to use a walker at all times and discussed fall safety precautions.  Check follow-up lipid profile today.  She will need follow-up carotid ultrasound at next visit for carotid stent surveillance.  Followup in the future with my nurse practitioner in 6 months or call earlier if necessary.  Stroke Prevention Some medical conditions and behaviors can lead to a higher chance of having a stroke. You can help prevent a stroke by eating healthy, exercising, not smoking, and managing any medical conditions you have. Stroke is a leading cause of functional impairment. Primary prevention is particularly important because a majority of strokes are first-time events. Stroke changes the lives of not only those who experience a stroke but also their family and other caregivers. How can this condition affect me? A stroke is a medical emergency and should be treated right away. A stroke can lead to brain damage and can sometimes be life-threatening. If a person gets medical treatment right away, there is a better chance of surviving and recovering from a stroke. What can increase my risk? The following medical  conditions may increase your risk of a stroke: Cardiovascular disease. High blood pressure (hypertension). Diabetes. High cholesterol. Sickle cell disease. Blood clotting disorders (hypercoagulable state). Obesity. Sleep disorders (obstructive sleep apnea). Other risk factors include: Being older than age 12. Having a history of blood clots, stroke, or mini-stroke (transient ischemic attack, TIA). Genetic factors, such as race, ethnicity, or a family history of stroke. Smoking cigarettes or using other tobacco products. Taking birth control pills, especially if you also use tobacco. Heavy use of alcohol or drugs, especially cocaine and methamphetamine. Physical inactivity. What actions can I take to prevent this? Manage your health conditions High cholesterol levels. Eating a healthy diet is important for preventing high cholesterol. If cholesterol cannot be managed through diet alone, you may need to take medicines. Take any prescribed medicines to control your cholesterol as told by your health care provider. Hypertension. To reduce your risk of stroke, try to keep your blood pressure below 130/80. Eating a healthy diet and exercising regularly are important for controlling blood pressure. If these steps are not enough to manage your blood pressure, you may need to take medicines. Take any prescribed medicines to control hypertension as told by your health care provider. Ask your health care provider if you should monitor your blood pressure at home. Have your blood pressure checked every year, even if your blood pressure is normal. Blood pressure increases with age and some medical conditions. Diabetes. Eating a healthy diet and exercising regularly are important parts of managing your blood sugar (glucose). If your blood sugar cannot be managed through diet and exercise, you may need to take medicines. Take any prescribed medicines to control your diabetes as told by  your health  care provider. Get evaluated for obstructive sleep apnea. Talk to your health care provider about getting a sleep evaluation if you snore a lot or have excessive sleepiness. Make sure that any other medical conditions you have, such as atrial fibrillation or atherosclerosis, are managed. Nutrition Follow instructions from your health care provider about what to eat or drink to help manage your health condition. These instructions may include: Reducing your daily calorie intake. Limiting how much salt (sodium) you use to 1,500 milligrams (mg) each day. Using only healthy fats for cooking, such as olive oil, canola oil, or sunflower oil. Eating healthy foods. You can do this by: Choosing foods that are high in fiber, such as whole grains, and fresh fruits and vegetables. Eating at least 5 servings of fruits and vegetables a day. Try to fill one-half of your plate with fruits and vegetables at each meal. Choosing lean protein foods, such as lean cuts of meat, poultry without skin, fish, tofu, beans, and nuts. Eating low-fat dairy products. Avoiding foods that are high in sodium. This can help lower blood pressure. Avoiding foods that have saturated fat, trans fat, and cholesterol. This can help prevent high cholesterol. Avoiding processed and prepared foods. Counting your daily carbohydrate intake.  Lifestyle If you drink alcohol: Limit how much you have to: 0-1 drink a day for women who are not pregnant. 0-2 drinks a day for men. Know how much alcohol is in your drink. In the [place] one drink equals one 12 oz bottle of beer ( ), one 5 oz glass of wine ( ), or one 1 oz glass of hard liquor (44mL). Do not use any products that contain nicotine or tobacco. These products include cigarettes, chewing tobacco, and vaping devices, such as e-cigarettes. If you need help quitting, ask your health care provider. Avoid secondhand smoke. Do not use drugs. Activity  Try to stay at a healthy  weight. Get at least 30 minutes of exercise on most days, such as: Fast walking. Biking. Swimming. Medicines Take over-the-counter and prescription medicines only as told by your health care provider. Aspirin  or blood thinners (antiplatelets or anticoagulants) may be recommended to reduce your risk of forming blood clots that can lead to stroke. Avoid taking birth control pills. Talk to your health care provider about the risks of taking birth control pills if: You are over 66 years old. You smoke. You get very bad headaches. You have had a blood clot. Where to find more information American Stroke Association: www.strokeassociation.org Get help right away if: You or a loved one has any symptoms of a stroke. "BE FAST" is an easy way to remember the main warning signs of a stroke: B - Balance. Signs are dizziness, sudden trouble walking, or loss of balance. E - Eyes. Signs are trouble seeing or a sudden change in vision. F - Face. Signs are sudden weakness or numbness of the face, or the face or eyelid drooping on one side. A - Arms. Signs are weakness or numbness in an arm. This happens suddenly and usually on one side of the body. S - Speech. Signs are sudden trouble speaking, slurred speech, or trouble understanding what people say. T - Time. Time to call emergency services. Write down what time symptoms started. You or a loved one has other signs of a stroke, such as: A sudden, severe headache with no known cause. Nausea or vomiting. Seizure. These symptoms may represent a serious problem that is an emergency. Do not wait to  see if the symptoms will go away. Get medical help right away. Call your local emergency services (911 in the [place] Do not drive yourself to the hospital. Summary You can help to prevent a stroke by eating healthy, exercising, not smoking, limiting alcohol intake, and managing any medical conditions you may have. Do not use any products that contain nicotine or  tobacco. These include cigarettes, chewing tobacco, and vaping devices, such as e-cigarettes. If you need help quitting, ask your health care provider. Remember "BE FAST" for warning signs of a stroke. Get help right away if you or a loved one has any of these signs. This information is not intended to replace advice given to you by your health care provider. Make sure you discuss any questions you have with your health care provider. Document Revised: 12/04/2021 Document Reviewed: 12/04/2021 Elsevier Patient Education  2024 [employer].

## 2023-05-14 NOTE — Assessment & Plan Note (Signed)
 Hypertension is well-controlled with current medication regimen. Blood pressure was 110 mmHg this morning. - Continue carvedilol  6.25 mg twice a day - Continue furosemide  20 mg daily - Continue lisinopril  10 mg daily

## 2023-05-14 NOTE — Assessment & Plan Note (Signed)
 Hyperlipidemia is managed with Crestor . Recent cholesterol levels were checked by Dr. Janett Medin. - Continue Crestor  20 mg daily

## 2023-05-14 NOTE — Assessment & Plan Note (Signed)
 Right-sided weakness post-stroke Right-sided weakness persists post-stroke with improvements. She is undergoing physical and occupational therapy. Safety with mobility is a concern, and use of a walker is recommended. Recovery is ongoing, with potential for further improvement over 6-12 months. - Continue physical therapy - Continue occupational therapy - Use walker for mobility to prevent falls

## 2023-05-15 LAB — LIPID PANEL
Chol/HDL Ratio: 3.7 ratio (ref 0.0–4.4)
Cholesterol, Total: 141 mg/dL (ref 100–199)
HDL: 38 mg/dL — ABNORMAL LOW (ref >39)
LDL Chol Calc (NIH): 59 mg/dL (ref 0–99)
Triglycerides: 276 mg/dL — ABNORMAL HIGH (ref 0–149)
VLDL Cholesterol Cal: 44 mg/dL — ABNORMAL HIGH (ref 5–40)

## 2023-05-15 NOTE — Progress Notes (Signed)
 Kindly inform the patient that cholesterol profile was mostly satisfactory except triglycerides are elevated though improved from before.  Kindly see primary care physician for treatment for this

## 2023-05-22 ENCOUNTER — Other Ambulatory Visit: Payer: Self-pay | Admitting: Family Medicine

## 2023-05-26 ENCOUNTER — Other Ambulatory Visit: Payer: Self-pay | Admitting: Family Medicine

## 2023-05-26 ENCOUNTER — Other Ambulatory Visit: Payer: Self-pay | Admitting: Cardiovascular Disease

## 2023-05-26 DIAGNOSIS — N3281 Overactive bladder: Secondary | ICD-10-CM

## 2023-05-26 DIAGNOSIS — M1A9XX0 Chronic gout, unspecified, without tophus (tophi): Secondary | ICD-10-CM

## 2023-05-28 NOTE — Telephone Encounter (Signed)
 Requested medications are due for refill today.  yes  Requested medications are on the active medications list.  yes  Last refill. 10/25/2022 #180 1 rf  Future visit scheduled.   no  Notes to clinic.  Expired labs.    Requested Prescriptions  Pending Prescriptions Disp Refills   allopurinol  (ZYLOPRIM ) 100 MG tablet [Pharmacy Med Name: ALLOPURINOL  100 MG TABLET] 180 tablet 1    Sig: TAKE 2 TABLETS BY MOUTH EVERY DAY     Endocrinology:  Gout Agents - allopurinol  Failed - 05/28/2023 12:09 PM      Failed - Uric Acid in normal range and within 360 days    Uric Acid  Date Value Ref Range Status  03/28/2021 7.0 3.1 - 7.9 mg/dL Final    Comment:               Therapeutic target for gout patients: <6.0         Passed - Cr in normal range and within 360 days    Creatinine, Ser  Date Value Ref Range Status  05/10/2023 0.97 0.57 - 1.00 mg/dL Final         Passed - Valid encounter within last 12 months    Recent Outpatient Visits           2 weeks ago Encounter for annual physical exam   [facility] [place], Stan Eans, MD   1 month ago Pain of right hip   [facility] Adeline Hone, PA-C   2 months ago ICAO (internal carotid artery occlusion), left   [facility] [place], Stan Eans, MD              Passed - CBC within normal limits and completed in the last 12 months    WBC  Date Value Ref Range Status  04/09/2023 8.8 4.0 - 10.5 K/uL Final   RBC  Date Value Ref Range Status  04/09/2023 3.52 (L) 3.87 - 5.11 MIL/uL Final   Hemoglobin  Date Value Ref Range Status  04/09/2023 10.3 (L) 12.0 - 15.0 g/dL Final  60/45/4098 11.9 (L) 11.1 - 15.9 g/dL Final   HCT  Date Value Ref Range Status  04/09/2023 32.0 (L) 36.0 - 46.0 % Final   Hematocrit  Date Value Ref Range Status  09/17/2018 33.0 (L) 34.0 - 46.6 % Final   MCHC  Date Value Ref Range Status  04/09/2023 32.2 30.0 - 36.0 g/dL Final    [facility]  Date Value Ref Range Status  04/09/2023 29.3 26.0 - 34.0 pg Final   MCV  Date Value Ref Range Status  04/09/2023 90.9 80.0 - 100.0 fL Final  09/17/2018 78 (L) 79 - 97 fL Final   No results found for: "PLTCOUNTKUC", "LABPLAT", "POCPLA" RDW  Date Value Ref Range Status  04/09/2023 14.8 11.5 - 15.5 % Final  09/17/2018 16.5 (H) 11.7 - 15.4 % Final         Signed Prescriptions Disp Refills   oxybutynin  (DITROPAN -XL) 10 MG 24 hr tablet 90 tablet 3    Sig: TAKE 1 TABLET BY MOUTH EVERY DAY     Urology:  Bladder Agents Passed - 05/28/2023 12:09 PM      Passed - Valid encounter within last 12 months    Recent Outpatient Visits           2 weeks ago Encounter for annual physical exam   [facility] [place], Stan Eans, MD   1  month ago Pain of right hip   [facility] Adeline Hone, PA-C   2 months ago ICAO (internal carotid artery occlusion), left   [facility], Stan Eans, MD               famotidine  (PEPCID ) 20 MG tablet 180 tablet 3    Sig: TAKE 1 TABLET BY MOUTH TWICE A DAY     Gastroenterology:  H2 Antagonists Passed - 05/28/2023 12:09 PM      Passed - Valid encounter within last 12 months    Recent Outpatient Visits           2 weeks ago Encounter for annual physical exam   [facility] [place], Stan Eans, MD   1 month ago Pain of right hip   [facility] Adeline Hone, PA-C   2 months ago ICAO (internal carotid artery occlusion), left   [facility], Stan Eans, MD

## 2023-05-28 NOTE — Telephone Encounter (Signed)
 Requested Prescriptions  Pending Prescriptions Disp Refills   oxybutynin  (DITROPAN -XL) 10 MG 24 hr tablet [Pharmacy Med Name: OXYBUTYNIN  CL ER 10 MG TABLET] 90 tablet 3    Sig: TAKE 1 TABLET BY MOUTH EVERY DAY     Urology:  Bladder Agents Passed - 05/28/2023 12:09 PM      Passed - Valid encounter within last 12 months    Recent Outpatient Visits           2 weeks ago Encounter for annual physical exam   [facility] [place], Stan Eans, MD   1 month ago Pain of right hip   [facility] Adeline Hone, PA-C   2 months ago ICAO (internal carotid artery occlusion), left   [facility] [place], Stan Eans, MD               allopurinol  (ZYLOPRIM ) 100 MG tablet [Pharmacy Med Name: ALLOPURINOL  100 MG TABLET] 180 tablet 1    Sig: TAKE 2 TABLETS BY MOUTH EVERY DAY     Endocrinology:  Gout Agents - allopurinol  Failed - 05/28/2023 12:09 PM      Failed - Uric Acid in normal range and within 360 days    Uric Acid  Date Value Ref Range Status  03/28/2021 7.0 3.1 - 7.9 mg/dL Final    Comment:               Therapeutic target for gout patients: <6.0         Passed - Cr in normal range and within 360 days    Creatinine, Ser  Date Value Ref Range Status  05/10/2023 0.97 0.57 - 1.00 mg/dL Final         Passed - Valid encounter within last 12 months    Recent Outpatient Visits           2 weeks ago Encounter for annual physical exam   [facility] [place], Stan Eans, MD   1 month ago Pain of right hip   [facility] Hone, PA-C   2 months ago ICAO (internal carotid artery occlusion), left   [facility] [place], Stan Eans, MD              Passed - CBC within normal limits and completed in the last 12 months    WBC  Date Value Ref Range Status  04/09/2023 8.8 4.0 - 10.5 K/uL Final   RBC  Date Value Ref Range  Status  04/09/2023 3.52 (L) 3.87 - 5.11 MIL/uL Final   Hemoglobin  Date Value Ref Range Status  04/09/2023 10.3 (L) 12.0 - 15.0 g/dL Final  91/47/8295 62.1 (L) 11.1 - 15.9 g/dL Final   HCT  Date Value Ref Range Status  04/09/2023 32.0 (L) 36.0 - 46.0 % Final   Hematocrit  Date Value Ref Range Status  09/17/2018 33.0 (L) 34.0 - 46.6 % Final   MCHC  Date Value Ref Range Status  04/09/2023 32.2 30.0 - 36.0 g/dL Final   [facility]  Date Value Ref Range Status  04/09/2023 29.3 26.0 - 34.0 pg Final   MCV  Date Value Ref Range Status  04/09/2023 90.9 80.0 - 100.0 fL Final  09/17/2018 78 (L) 79 - 97 fL Final   No results found for: "PLTCOUNTKUC", "LABPLAT", "POCPLA" RDW  Date Value Ref Range Status  04/09/2023 14.8 11.5 - 15.5 % Final  09/17/2018 16.5 (H) 11.7 -  15.4 % Final          famotidine  (PEPCID ) 20 MG tablet [Pharmacy Med Name: FAMOTIDINE  20 MG TABLET] 180 tablet 3    Sig: TAKE 1 TABLET BY MOUTH TWICE A DAY     Gastroenterology:  H2 Antagonists Passed - 05/28/2023 12:09 PM      Passed - Valid encounter within last 12 months    Recent Outpatient Visits           2 weeks ago Encounter for annual physical exam   [place] [facility] [place], Stan Eans, MD   1 month ago Pain of right hip   [facility] Adeline Hone, PA-C   2 months ago ICAO (internal carotid artery occlusion), left   [facility] [place], Stan Eans, MD

## 2023-06-20 ENCOUNTER — Other Ambulatory Visit: Payer: Self-pay | Admitting: Family Medicine

## 2023-06-20 ENCOUNTER — Ambulatory Visit: Admitting: Pulmonary Disease

## 2023-06-20 ENCOUNTER — Encounter: Payer: Self-pay | Admitting: Pulmonary Disease

## 2023-06-20 VITALS — BP 124/82 | HR 65 | Temp 97.1°F | Ht 65.0 in | Wt 184.2 lb

## 2023-06-20 DIAGNOSIS — G4733 Obstructive sleep apnea (adult) (pediatric): Secondary | ICD-10-CM

## 2023-06-20 DIAGNOSIS — Z87891 Personal history of nicotine dependence: Secondary | ICD-10-CM

## 2023-06-20 DIAGNOSIS — R0609 Other forms of dyspnea: Secondary | ICD-10-CM | POA: Diagnosis not present

## 2023-06-20 DIAGNOSIS — Z8673 Personal history of transient ischemic attack (TIA), and cerebral infarction without residual deficits: Secondary | ICD-10-CM

## 2023-06-20 DIAGNOSIS — J4520 Mild intermittent asthma, uncomplicated: Secondary | ICD-10-CM | POA: Diagnosis not present

## 2023-06-20 DIAGNOSIS — M1A9XX0 Chronic gout, unspecified, without tophus (tophi): Secondary | ICD-10-CM

## 2023-06-20 NOTE — Progress Notes (Unsigned)
 Subjective:    Patient ID: Natasha Vasquez, female    DOB: 11/20/1940, 83 y.o.   MRN: [id number]  Patient Care Team: Mazie Speed, MD as PCP - General (Family Medicine) Jerelene Monday Deadra Everts, MD as PCP - Cardiology (Cardiology) Arnulfo Bidding, PA (Inactive) as Physician Assistant (Physician Assistant) Aubry Blase, Robbie Chiles, MD as Consulting Physician (Orthopedic Surgery) Byrnett, Magali Schmitz, MD (General Surgery) Dasher, Margette Sheldon, MD (Dermatology) Gwyn Leos, MD as Consulting Physician (Internal Medicine) Annell Kidney, MD as Referring Physician (Ophthalmology) Marc Senior, MD as Consulting Physician (Pulmonary Disease) Zelphia Higashi, MD as Consulting Physician (Cardiothoracic Surgery)  Chief Complaint  Patient presents with  . Follow-up    Little wheezing. DOE. Cough since her stroke.    BACKGROUND/INTERVAL:  HPI    Review of Systems A 10 point review of systems was performed and it is as noted above otherwise negative.   Patient Active Problem List   Diagnosis Date Noted  . OSA on CPAP 05/14/2023  . Arterial stenosis (HCC) 04/08/2023  . Stenosis of left carotid artery 04/08/2023  . History of cerebrovascular accident (CVA) with residual deficit 03/15/2023  . ICAO (internal carotid artery occlusion), left 02/26/2023  . Brainstem infarct, acute (HCC) 02/26/2023  . Right sided weakness 02/25/2023  . CVA (cerebrovascular accident) (HCC) 02/25/2023  . Moderate episode of recurrent major depressive disorder (HCC) 02/11/2023  . Sciatic pain, left 09/27/2021  . SOB (shortness of breath) 05/16/2021  . Aneurysm of ascending aorta without rupture (HCC) 05/10/2021  . HLD (hyperlipidemia) 05/04/2021  . Elevated troponin 05/04/2021  . Chronic diastolic CHF (congestive heart failure) (HCC) 05/04/2021  . Recurrent major depressive disorder, in partial remission (HCC) 03/24/2021  . Gout 03/24/2021  . PAD (peripheral artery disease) (HCC) 06/24/2020  .  Iron  deficiency 10/07/2018  . Primary osteoarthritis of left knee 09/17/2018  . Emphysema lung (HCC) 03/14/2018  . Diabetic peripheral neuropathy associated with type 2 diabetes mellitus (HCC) 03/14/2018  . Metatarsalgia of both feet 03/14/2018  . Abnormality of lung 03/14/2018  . Acquired absence of right breast 06/11/2017  . 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 Dysphagia 03/11/2017  . Obesity 03/11/2017  . Subclinical hypothyroidism 06/08/2015  . Osteopenia 06/08/2015  . Billowing mitral valve 06/08/2015  . Melanoma in situ (HCC) 06/08/2015  . Cramps of lower extremity 06/08/2015  . Type 2 diabetes mellitus (HCC) 05/18/2015  . Chronic diarrhea 05/18/2015  . Anemia 05/18/2015  . Clinical depression 01/26/2015  . Hyperlipidemia associated with type 2 diabetes mellitus (HCC) 10/28/2014  . Hypertension associated with diabetes (HCC) 09/27/2014  . GERD (gastroesophageal reflux disease) 08/06/2014  . Overactive bladder 07/16/2014    Social History   Tobacco Use  . Smoking status: Former    Current packs/day: 0.00    Types: Cigarettes    Start date: 1963    Quit date: 1965    Years since quitting: 60.4  . Smokeless tobacco: Never  . Tobacco comments:    smoked in college--1 pack would last one week.  Substance Use Topics  . Alcohol use: Not Currently    Alcohol/week: 7.0 - 14.0 standard drinks of alcohol    Types: 7 - 14 Glasses of wine per week    Comment: 1-2 glasses of wine per night    Allergies  Allergen Reactions  . Codeine Other (See Comments)    Chest pain  . Ciprofloxacin   Can not take   . Quinolones     PATIENT UNAWARE OF allergy to this class of antibiotics, only aware of allergy to [facility] Patient was warned about not using Cipro  and similar antibiotics. Recent studies have raised concern that fluoroquinolone antibiotics could be associated with an  increased risk of aortic aneurysm Fluoroquinolones have non-antimicrobial properties that might jeopardise the integrity of the extracellular matrix of the vascular wall In a  propensity score matched cohort study in [place], there was a 66% increased rate  . Aleve [Naproxen Sodium] Hives, Rash and Other (See Comments)    headaches  . Lorazepam Other (See Comments)    "Feels loopy"  . Oxycodone  Other (See Comments)    MENTAL CHANGES  . Paregoric Other (See Comments)    Chest pain---tolerated MORPHINE   . Penicillins Hives    Can take Augmentin    Current Meds  Medication Sig  . acetaminophen  (TYLENOL ) 500 MG tablet Take 500 mg by mouth every 6 (six) hours as needed for moderate pain (pain score 4-6).  . albuterol  (VENTOLIN  HFA) 108 (90 Base) MCG/ACT inhaler TAKE 2 PUFFS BY MOUTH EVERY 6 HOURS AS NEEDED FOR WHEEZE OR SHORTNESS OF BREATH  . allopurinol  (ZYLOPRIM ) 100 MG tablet TAKE 2 TABLETS BY MOUTH EVERY DAY  . ARIPiprazole  (ABILIFY ) 2 MG tablet TAKE 1 TABLET BY MOUTH EVERY DAY  . aspirin  EC 81 MG tablet Take 81 mg by mouth daily. Swallow whole.  . bismuth  subsalicylate (PEPTO BISMOL) 262 MG chewable tablet Chew 262 mg by mouth as needed for indigestion or diarrhea or loose stools. **capsule**  . calcium  carbonate (TUMS - DOSED IN MG ELEMENTAL CALCIUM ) 500 MG chewable tablet Chew 1 tablet by mouth as needed for indigestion or heartburn.  . carboxymethylcellulose (REFRESH PLUS) 0.5 % SOLN Place 1 drop into both eyes 3 (three) times daily as needed (Dry Eyes).  . carvedilol  (COREG ) 6.25 MG tablet Take 1 tablet (6.25 mg total) by mouth 2 (two) times daily.  . Cholecalciferol (VITAMIN D -3 PO) Take 1 tablet by mouth daily.  . clopidogrel  (PLAVIX ) 75 MG tablet Take 1 tablet (75 mg total) by mouth daily.  . colestipol  (COLESTID ) 1 g tablet TAKE 1 TABLET BY MOUTH 2 TIMES DAILY.  . famotidine  (PEPCID ) 20 MG tablet TAKE 1 TABLET BY MOUTH TWICE A DAY  . ferrous sulfate  325 (65 FE) MG tablet Take 325  mg by mouth daily with breakfast.  . fluocinonide (LIDEX) 0.05 % external solution Apply 1 Application topically daily as needed (scalp).  . furosemide  (LASIX ) 20 MG tablet TAKE 1 TABLET BY MOUTH EVERY DAY  . lisinopril  (ZESTRIL ) 10 MG tablet Take 1 tablet (10 mg total) by mouth daily.  . metoprolol  succinate (TOPROL -XL) 50 MG 24 hr tablet Take 25 mg by mouth daily.  . Multiple Vitamins-Minerals (CENTRUM SILVER PO) Take 1 tablet by mouth daily.  . oxybutynin  (DITROPAN -XL) 10 MG 24 hr tablet TAKE 1 TABLET BY MOUTH EVERY DAY  . pantoprazole  (PROTONIX ) 40 MG tablet Take 1 tablet (40 mg total) by mouth 2 (two) times daily before a meal.  . potassium chloride  (KLOR-CON ) 10 MEQ tablet TAKE 1 TABLET BY MOUTH EVERY DAY  . pregabalin  (LYRICA ) 25 MG capsule Take 1 capsule (25 mg total) by mouth 2 (two) times daily.  . rosuvastatin  (CRESTOR ) 20 MG tablet Take 1 tablet (20 mg total) by mouth daily.  . tirzepatide (MOUNJARO) 5 MG/0.5ML Pen Inject 5 mg into the skin once a week.  . Venlafaxine  HCl 225  MG TB24 Take 1 tablet (225 mg total) by mouth daily at 2 PM.    Immunization History  Administered Date(s) Administered  . Fluad Quad(high Dose 65+) 09/09/2018, 10/12/2019, 10/14/2020, 09/25/2021  . Influenza Split 10/24/2011  . Influenza, High Dose Seasonal PF 11/18/2013, 01/24/2016, 10/08/2016, 09/12/2017, 10/12/2022  . Influenza-Unspecified 10/24/2011, 10/14/2012, 11/18/2013  . Moderna Sars-Covid-2 Vaccination 01/30/2019, 03/02/2019  . Pfizer(Comirnaty)Fall Seasonal Vaccine 12 years and older 10/12/2022  . Pneumococcal Conjugate-13 08/21/2013  . Pneumococcal Polysaccharide-23 01/24/2016  . Respiratory Syncytial Virus Vaccine,Recomb Aduvanted(Arexvy) 11/11/2021  . Tdap 10/24/2011  . Zoster Recombinant(Shingrix) 04/01/2016, 08/14/2016        Objective:     BP 124/82 (BP Location: Left Arm, Cuff Size: Normal)   Pulse 65   Temp (!) 97.1 F (36.2 C)   Ht 5\' 5"  (1.651 m)   Wt 184 lb 3.2 oz (83.6  kg)   SpO2 96%   BMI 30.65 kg/m   SpO2: 96 % O2 Device: None (Room air)  GENERAL: HEAD: Normocephalic, atraumatic.  EYES: Pupils equal, round, reactive to light.  No scleral icterus.  MOUTH:  NECK: Supple. No thyromegaly. Trachea midline. No JVD.  No adenopathy. PULMONARY: Good air entry bilaterally.  No adventitious sounds. CARDIOVASCULAR: S1 and S2. Regular rate and rhythm.  ABDOMEN: MUSCULOSKELETAL: No joint deformity, no clubbing, no edema.  NEUROLOGIC:  SKIN: Intact,warm,dry. PSYCH:        Assessment & Plan:     ICD-10-CM   1. Dyspnea on exertion  R06.09     2. OSA (obstructive sleep apnea) - COMPLEX  G47.33     3. Brainstem infarct, recent, without late effect  Z86.73     4. Mild intermittent asthmatic bronchitis without complication  J45.20       No orders of the defined types were placed in this encounter.   No orders of the defined types were placed in this encounter.      Advised if symptoms do not improve or worsen, to please contact office for sooner follow up or seek emergency care.    I spent xxx minutes of dedicated to the care of this patient on the date of this encounter to include pre-visit review of records, face-to-face time with the patient discussing conditions above, post visit ordering of testing, clinical documentation with the electronic health record, making appropriate referrals as documented, and communicating necessary findings to members of the patients care team.     C. Chloe Counter, MD Advanced Bronchoscopy PCCM [facility]    *This note was generated using voice recognition software/Dragon and/or AI transcription program.  Despite best efforts to proofread, errors can occur which can change the meaning. Any transcriptional errors that result from this process are unintentional and may not be fully corrected at the time of dictation.

## 2023-06-20 NOTE — Patient Instructions (Addendum)
 VISIT SUMMARY:  Today, you came in for a follow-up visit regarding your sleep apnea and shortness of breath. You mentioned that your shortness of breath has not changed since your last visit, and you have not had any acute episodes. You also discussed your experience with CPAP therapy, noting both improvements and some minor issues. Additionally, you talked about the challenges you face with swallowing following your stroke.  YOUR PLAN:  -OBSTRUCTIVE SLEEP APNEA: Obstructive sleep apnea is a condition where your airway becomes blocked during sleep, causing breathing interruptions. Your condition is well-controlled with CPAP therapy, which helps keep your airway open. You reported improved sleep quality but sometimes wake up groggy and have minor issues with the CPAP mask. You should continue using your current CPAP therapy and follow up in six months.  -DYSPNEA: Dyspnea means shortness of breath. Your condition remains unchanged, and you have not been able to test your breathing capacity due to your recent stroke. However, you feel great overall and have no significant issues at this time.  -DYSPHAGIA: Dysphagia is difficulty swallowing. You continue to have trouble swallowing solid foods, especially juicy fruits like strawberries, but do better with thicker liquids like smoothies.  You declined reevaluation by speech therapy at this time.  INSTRUCTIONS:  Please continue your current CPAP therapy and follow up in six months. If you experience any new or worsening symptoms, contact our office.

## 2023-06-21 ENCOUNTER — Encounter: Payer: Self-pay | Admitting: Pulmonary Disease

## 2023-06-22 ENCOUNTER — Other Ambulatory Visit: Payer: Self-pay | Admitting: Family Medicine

## 2023-06-22 DIAGNOSIS — M1A9XX0 Chronic gout, unspecified, without tophus (tophi): Secondary | ICD-10-CM

## 2023-06-25 ENCOUNTER — Other Ambulatory Visit: Payer: Self-pay | Admitting: Family Medicine

## 2023-06-25 DIAGNOSIS — M25551 Pain in right hip: Secondary | ICD-10-CM

## 2023-06-27 NOTE — Telephone Encounter (Signed)
 Requested medications are due for refill today.  yes  Requested medications are on the active medications list.  yes  Last refill. 04/05/2023 #60 1 rf  Future visit scheduled.   yes  Notes to clinic.  Refill not delegated.    Requested Prescriptions  Pending Prescriptions Disp Refills   pregabalin  (LYRICA ) 25 MG capsule [Pharmacy Med Name: PREGABALIN  25 MG CAPSULE] 60 capsule 1    Sig: TAKE 1 CAPSULE BY MOUTH 2 TIMES DAILY.     Not Delegated - Neurology:  Anticonvulsants - Controlled - pregabalin  Failed - 06/27/2023 11:33 AM      Failed - This refill cannot be delegated      Passed - Cr in normal range and within 360 days    Creatinine, Ser  Date Value Ref Range Status  05/10/2023 0.97 0.57 - 1.00 mg/dL Final         Passed - Completed PHQ-2 or PHQ-9 in the last 360 days      Passed - Valid encounter within last 12 months    Recent Outpatient Visits           1 month ago Encounter for annual physical exam    [facility] [place], Stan Eans, MD   2 months ago Pain of right hip   [facility] Adeline Hone, PA-C   3 months ago ICAO (internal carotid artery occlusion), left   [facility] [place], Stan Eans, MD

## 2023-07-19 ENCOUNTER — Other Ambulatory Visit: Payer: Self-pay | Admitting: Family Medicine

## 2023-07-19 DIAGNOSIS — M1A9XX0 Chronic gout, unspecified, without tophus (tophi): Secondary | ICD-10-CM

## 2023-08-05 ENCOUNTER — Other Ambulatory Visit: Payer: Self-pay | Admitting: Gastroenterology

## 2023-08-05 DIAGNOSIS — R1314 Dysphagia, pharyngoesophageal phase: Secondary | ICD-10-CM

## 2023-08-05 DIAGNOSIS — Z8719 Personal history of other diseases of the digestive system: Secondary | ICD-10-CM

## 2023-08-05 DIAGNOSIS — K2100 Gastro-esophageal reflux disease with esophagitis, without bleeding: Secondary | ICD-10-CM

## 2023-08-07 ENCOUNTER — Ambulatory Visit
Admission: RE | Admit: 2023-08-07 | Discharge: 2023-08-07 | Disposition: A | Discharge status: Home / Self Care | Source: Ambulatory Visit | Attending: Gastroenterology | Admitting: Gastroenterology

## 2023-08-07 DIAGNOSIS — K2100 Gastro-esophageal reflux disease with esophagitis, without bleeding: Secondary | ICD-10-CM | POA: Diagnosis present

## 2023-08-07 DIAGNOSIS — Z8719 Personal history of other diseases of the digestive system: Secondary | ICD-10-CM | POA: Insufficient documentation

## 2023-08-07 DIAGNOSIS — R1314 Dysphagia, pharyngoesophageal phase: Secondary | ICD-10-CM | POA: Diagnosis present

## 2023-08-14 ENCOUNTER — Ambulatory Visit: Payer: Medicare PPO

## 2023-08-14 ENCOUNTER — Other Ambulatory Visit: Payer: Self-pay | Admitting: Family Medicine

## 2023-08-14 DIAGNOSIS — Z1231 Encounter for screening mammogram for malignant neoplasm of breast: Secondary | ICD-10-CM

## 2023-08-14 DIAGNOSIS — Z0000 Encounter for general adult medical examination without abnormal findings: Secondary | ICD-10-CM | POA: Diagnosis not present

## 2023-08-14 NOTE — Patient Instructions (Addendum)
 Natasha Vasquez , Thank you for taking time out of your busy schedule to complete your Annual Wellness Visit with me. I enjoyed our conversation and look forward to speaking with you again next year. I, as well as your care team,  appreciate your ongoing commitment to your health goals. Please review the following plan we discussed and let me know if I can assist you in the future.  REFERRAL SENT FOR MAMMOGRAM  You have an order for:  []   2D Mammogram  [x]   3D Mammogram  []   Bone Density     Please call for appointment:  [facility]  [address] [place] [address] [phone] [facility] [address] [place], [place] [address] [phone] [facility] [address] [place], [place] [address] [phone]   Make sure to wear two-piece clothing.  No lotions, powders, or deodorants the day of the appointment. Make sure to bring picture ID and insurance card.  Bring list of medications you are currently taking including any supplements.   Schedule your [place] screening mammogram through MyChart!   Log into your MyChart account.  Go to 'Visit' (or 'Appointments' if on mobile App) --> Schedule an Appointment  Under 'Select a Reason for Visit' choose the Mammogram Screening option.  Complete the pre-visit questions and select the time and place that best fits your schedule.   Follow up Visits: Next Medicare AWV with our clinical staff:   08/19/24 @ 1:10 PM BY PHONE Have you seen your provider in the last 6 months (3 months if uncontrolled diabetes)? Yes  Clinician Recommendations:  Aim for 30 minutes of exercise or brisk walking, 6-8 glasses of water, and 5 servings of fruits and vegetables each day. TAKE CARE!      This is a list of the screening recommended for you and due dates:  Health Maintenance  Topic Date Due   DTaP/Tdap/Td vaccine (2 -  Td or Tdap) 10/23/2021   Eye exam for diabetics  11/22/2022   COVID-19 Vaccine (4 - Mixed Product risk 2024-25 season) 04/11/2023   Hemoglobin A1C  08/11/2023   Flu Shot  08/16/2023   Mammogram  09/13/2023   Complete foot exam   02/11/2024   Yearly kidney health urinalysis for diabetes  02/25/2024   Yearly kidney function blood test for diabetes  05/09/2024   Medicare Annual Wellness Visit  08/13/2024   DEXA scan (bone density measurement)  05/03/2025   Pneumococcal Vaccine for age over 70  Completed   Zoster (Shingles) Vaccine  Completed   Hepatitis B Vaccine  Aged Out   HPV Vaccine  Aged Out   Meningitis B Vaccine  Aged Out    Advanced directives: (ACP Link)Information on Advanced Care Planning can be found at [place]  Best boy Advance Health Care Directives Advance Health Care Directives. [email or web address]  Advance Care Planning is important because it:  [x]  Makes sure you receive the medical care that is consistent with your values, goals, and preferences  [x]  It provides guidance to your family and loved ones and reduces their decisional burden about whether or not they are making the right decisions based on your wishes.  Follow the link provided in your after visit summary or read over the paperwork we have mailed to you to help you started getting your Advance Directives in place. If you need assistance in completing these, please  reach out to us  so that we can help you!

## 2023-08-14 NOTE — Progress Notes (Signed)
 Subjective:   Natasha Vasquez is a 83 y.o. who presents for a Medicare Wellness preventive visit.  As a reminder, Annual Wellness Visits don't include a physical exam, and some assessments may be limited, especially if this visit is performed virtually. We may recommend an in-person follow-up visit with your provider if needed.  Visit Complete: Virtual I connected with  Natasha Vasquez on 08/14/23 by a audio enabled telemedicine application and verified that I am speaking with the correct person using two identifiers.  Patient Location: Home  Provider Location: Home Office  I discussed the limitations of evaluation and management by telemedicine. The patient expressed understanding and agreed to proceed.  Vital Signs: Because this visit was a virtual/telehealth visit, some criteria may be missing or patient reported. Any vitals not documented were not able to be obtained and vitals that have been documented are patient reported.  VideoDeclined- This patient declined Librarian, academic. Therefore the visit was completed with audio only.  Persons Participating in Visit: Patient.  AWV Questionnaire: No: Patient Medicare AWV questionnaire was not completed prior to this visit.  Cardiac Risk Factors include: advanced age (>30men, >91 women);diabetes mellitus;hypertension;dyslipidemia;obesity (BMI >30kg/m2)     Objective:    There were no vitals filed for this visit. There is no height or weight on file to calculate BMI.     08/14/2023    2:41 PM 04/08/2023    6:36 AM 02/25/2023   11:11 AM 04/23/2022    2:12 PM 05/04/2021   11:52 AM 03/21/2021    1:30 PM 03/03/2021    1:02 PM  Advanced Directives  Does Patient Have a Medical Advance Directive? No Yes Yes Yes No Yes Yes  Type of Special educational needs teacher of [place];Living will    Living will Living will;Healthcare Power of Attorney  Does patient want to make changes to medical advance directive?       Yes (Inpatient - patient defers changing a medical advance directive and declines information at this time)   Would patient like information on creating a medical advance directive? No - Patient declined    No - Patient declined      Current Medications (verified) Outpatient Encounter Medications as of 08/14/2023  Medication Sig   acetaminophen  (TYLENOL ) 500 MG tablet Take 500 mg by mouth every 6 (six) hours as needed for moderate pain (pain score 4-6).   albuterol  (VENTOLIN  HFA) 108 (90 Base) MCG/ACT inhaler TAKE 2 PUFFS BY MOUTH EVERY 6 HOURS AS NEEDED FOR WHEEZE OR SHORTNESS OF BREATH   allopurinol  (ZYLOPRIM ) 100 MG tablet Take 2 tablets (200 mg total) by mouth daily.   ARIPiprazole  (ABILIFY ) 2 MG tablet TAKE 1 TABLET BY MOUTH EVERY DAY   bismuth  subsalicylate (PEPTO BISMOL) 262 MG chewable tablet Chew 262 mg by mouth as needed for indigestion or diarrhea or loose stools. **capsule**   calcium  carbonate (TUMS - DOSED IN MG ELEMENTAL CALCIUM ) 500 MG chewable tablet Chew 1 tablet by mouth as needed for indigestion or heartburn.   carboxymethylcellulose (REFRESH PLUS) 0.5 % SOLN Place 1 drop into both eyes 3 (three) times daily as needed (Dry Eyes).   carvedilol  (COREG ) 6.25 MG tablet Take 1 tablet (6.25 mg total) by mouth 2 (two) times daily.   Cholecalciferol (VITAMIN D -3 PO) Take 1 tablet by mouth daily.   clopidogrel  (PLAVIX ) 75 MG tablet Take 1 tablet (75 mg total) by mouth daily.   colestipol  (COLESTID ) 1 g tablet TAKE 1 TABLET BY MOUTH  2 TIMES DAILY.   famotidine  (PEPCID ) 20 MG tablet TAKE 1 TABLET BY MOUTH TWICE A DAY   ferrous sulfate  325 (65 FE) MG tablet Take 325 mg by mouth daily with breakfast.   fluocinonide (LIDEX) 0.05 % external solution Apply 1 Application topically daily as needed (scalp).   furosemide  (LASIX ) 20 MG tablet TAKE 1 TABLET BY MOUTH EVERY DAY   lisinopril  (ZESTRIL ) 10 MG tablet Take 1 tablet (10 mg total) by mouth daily.   metoprolol  succinate (TOPROL -XL) 50  MG 24 hr tablet Take 25 mg by mouth daily.   Multiple Vitamins-Minerals (CENTRUM SILVER PO) Take 1 tablet by mouth daily.   oxybutynin  (DITROPAN -XL) 10 MG 24 hr tablet TAKE 1 TABLET BY MOUTH EVERY DAY   pantoprazole  (PROTONIX ) 40 MG tablet TAKE 1 TABLET (40 MG TOTAL) BY MOUTH TWICE A DAY BEFORE MEALS   potassium chloride  (KLOR-CON ) 10 MEQ tablet TAKE 1 TABLET BY MOUTH EVERY DAY   pregabalin  (LYRICA ) 25 MG capsule TAKE 1 CAPSULE BY MOUTH 2 TIMES DAILY.   rosuvastatin  (CRESTOR ) 20 MG tablet Take 1 tablet (20 mg total) by mouth daily.   Venlafaxine  HCl 225 MG TB24 Take 1 tablet (225 mg total) by mouth daily at 2 PM.   aspirin  EC 81 MG tablet Take 81 mg by mouth daily. Swallow whole. (Patient not taking: Reported on 08/14/2023)   tirzepatide  (MOUNJARO ) 5 MG/0.5ML Pen Inject 5 mg into the skin once a week. (Patient not taking: Reported on 08/14/2023)   [DISCONTINUED] pantoprazole  (PROTONIX ) 40 MG tablet Take 1 tablet (40 mg total) by mouth 2 (two) times daily before a meal.   No facility-administered encounter medications on file as of 08/14/2023.    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  ER/PR NEGATIVE   Cataract    Chronic cough    Complex sleep apnea syndrome    AVAPS   Complication of anesthesia    20 years ago pts b/p dropped really low, coded   Depression    Depression    Diabetes mellitus without complication (HCC)    diet controlled   Family history of adverse reaction to anesthesia    son PONV   GERD (gastroesophageal reflux disease)    Gout    Hypercholesteremia    Hyperlipidemia    Hypertension    Melanoma (HCC)    MVP (mitral valve prolapse)    followed by PCP    Nephritis    Nephritis    S/P appendectomy    Stroke [facility])    Thoracic aortic aneurysm (HCC)    Torn meniscus    left   Past Surgical History:  Procedure Laterality Date   ABDOMINAL HYSTERECTOMY     APPENDECTOMY     BREAST BIOPSY Right 04/25/2017   retroareolar 10:00   wing clip   path pending   BREAST BIOPSY Right 04/25/2017   10:00  5CMFN  venus clip    path pending   BREAST BIOPSY Right 05/03/2017   Affirm Bx- path pending   BREAST CYST ASPIRATION Bilateral    NEG   BREAST EXCISIONAL BIOPSY Left 20+ yrs ago   NEG   BREAST RECONSTRUCTION WITH PLACEMENT OF TISSUE EXPANDER AND FLEX  HD (ACELLULAR HYDRATED DERMIS) Right 06/03/2017   Procedure: BREAST RECONSTRUCTION WITH PLACEMENT OF TISSUE EXPANDER AND FLEX HD (ACELLULAR HYDRATED DERMIS);  Surgeon: Lowery Estefana RAMAN, DO;  Location: [facility] ORS;  Service: Plastics;  Laterality: Right;   BREAST REDUCTION SURGERY Left 11/06/2017   Procedure: BREAST REDUCTION;  Surgeon: Lowery Estefana RAMAN, DO;  Location: [facility] ORS;  Service: Plastics;  Laterality: Left;   BROW LIFT Bilateral 02/01/2015   Procedure: BLEPHAROPLASTY bilateral upper eyelids.;  Surgeon: Greig CHRISTELLA Gay, MD;  Location: [facility];  Service: Ophthalmology;  Laterality: Bilateral;  DIABETIC - diet controlled   CATARACT EXTRACTION W/PHACO Left 01/14/2019   Procedure: CATARACT EXTRACTION PHACO AND INTRAOCULAR LENS PLACEMENT (IOC) LEFT TECNIS TORIC ADD 5.62 00:46.5 30.0%;  Surgeon: Mittie Gaskin, MD;  Location: [facility];  Service: Ophthalmology;  Laterality: Left;  diabetic - diet controlled   CATARACT EXTRACTION W/PHACO Right 02/11/2019   Procedure: CATARACT EXTRACTION PHACO AND INTRAOCULAR LENS PLACEMENT (IOC) RIGHT DIABETIC 4.82 00:46.9 10.3%;  Surgeon: Mittie Gaskin, MD;  Location: [facility];  Service: Ophthalmology;  Laterality: Right;  Diabetic - diet controlled   CHOLECYSTECTOMY     COLONOSCOPY     COLONOSCOPY WITH PROPOFOL  N/A  05/11/2015   Procedure: COLONOSCOPY WITH PROPOFOL ;  Surgeon: Lamar ONEIDA Holmes, MD;  Location: [facility] ENDOSCOPY;  Service: Endoscopy;  Laterality: N/A;   COLONOSCOPY WITH PROPOFOL  N/A 09/07/2020   Procedure: COLONOSCOPY WITH PROPOFOL ;  Surgeon: Toledo, Ladell POUR, MD;  Location: [facility] ENDOSCOPY;  Service: Gastroenterology;  Laterality: N/A;   ESOPHAGOGASTRODUODENOSCOPY     ESOPHAGOGASTRODUODENOSCOPY (EGD) WITH PROPOFOL   05/11/2015   Procedure: ESOPHAGOGASTRODUODENOSCOPY (EGD) WITH PROPOFOL ;  Surgeon: Lamar ONEIDA Holmes, MD;  Location: [facility] ENDOSCOPY;  Service: Endoscopy;;   EYE SURGERY Bilateral    cataracts   FRACTURE SURGERY Right    arm and shoulder   IR ANGIO INTRA EXTRACRAN SEL COM CAROTID INNOMINATE BILAT MOD SED  02/27/2023   IR ANGIO INTRA EXTRACRAN SEL INTERNAL CAROTID UNI L MOD SED  04/08/2023   IR ANGIO VERTEBRAL SEL VERTEBRAL UNI R MOD SED  02/27/2023   IR CT HEAD LTD  04/08/2023   IR INTRA CRAN STENT  04/08/2023   IR RADIOLOGIST EVAL & MGMT  03/15/2023   IR RADIOLOGIST EVAL & MGMT  05/06/2023   IR US  GUIDE VASC ACCESS RIGHT  02/27/2023   JOINT REPLACEMENT     KNEE ARTHROSCOPY Left 03/14/2016   Procedure: ARTHROSCOPY KNEE, PARTIAL MEDIAL MENISECTOMY, CHONDROPLASTY;  Surgeon: Lynwood SHAUNNA Hue, MD;  Location: [facility] OR[facility];  Service: Orthopedics;  Laterality: Left;   MASTECTOMY Right 2019   MASTECTOMY W/ SENTINEL NODE BIOPSY Right 06/03/2017   Procedure: MASTECTOMY WITH SENTINEL LYMPH NODE BIOPSY;  Surgeon: Dessa Reyes ORN, MD;  Location: [facility] OR[facility];  Service: General;  Laterality: Right;   PARTIAL HIP ARTHROPLASTY Right    RADIOLOGY WITH ANESTHESIA N/A 04/08/2023   Procedure: RADIOLOGY WITH ANESTHESIA;  Surgeon: Dolphus Carrion, MD;  Location: [facility];  Service: Radiology;  Laterality: N/A;  Cerebral angioplasty with stenting   REDUCTION MAMMAPLASTY Left 2019   REMOVAL OF BILATERAL TISSUE EXPANDERS WITH PLACEMENT OF BILATERAL BREAST IMPLANTS Right 11/06/2017   Procedure: REMOVAL OF TISSUE  EXPANDER WITH PLACEMENT OF BREAST IMPLANT;  Surgeon: Lowery Estefana RAMAN, DO;  Location: [facility] ORS;  Service: Plastics;  Laterality: Right;   TONSILLECTOMY     Family History  Problem Relation Age of Onset   Cancer Mother        colon/rectal   Colon cancer Mother  Heart disease Father    Hypertension Father    Atrial fibrillation Father    Diabetes Father    Cancer Brother        lung   Healthy Son    Healthy Son    Breast cancer Neg Hx    Stroke Neg Hx    Social History   Socioeconomic History   Marital status: Married    Spouse name: [name]   Number of children: 2   Years of education: Not on file   Highest education level: Master's degree (e.g., MA, MS, MEng, MEd, MSW, MBA)  Occupational History   Occupation: retired  Tobacco Use   Smoking status: Former    Current packs/day: 0.00    Types: Cigarettes    Start date: 1963    Quit date: 1965    Years since quitting: 60.6   Smokeless tobacco: Never   Tobacco comments:    smoked in college--1 pack would last one week.  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    Retired    Teacher, early years/pre Strain: Low Risk  (08/14/2023)   Overall Financial Resource Strain (CARDIA)    Difficulty of Paying Living Expenses: Not hard at all  Food Insecurity: No Food Insecurity (08/14/2023)   Hunger Vital Sign    Worried About Running Out of Food in the Last Year: Never true    Ran Out of Food in the Last Year: Never true  Transportation Needs: No Transportation Needs (08/14/2023)   PRAPARE - Therapist, art (Medical): No    Lack of Transportation (Non-Medical): No  Physical Activity: Sufficiently Active (08/14/2023)   Exercise Vital Sign    Days of Exercise per Week: 4 days    Minutes of Exercise per Session: 60 min  Stress: No Stress Concern Present (08/14/2023)   [employer] of Occupational Health - Occupational Stress Questionnaire    Feeling of Stress: Not at all  Social Connections: Socially Integrated (08/14/2023)   Social Connection and Isolation Panel    Frequency of Communication with Friends and Family: More than three times a week    Frequency of Social Gatherings with Friends and Family: More than three times a week    Attends Religious Services: More than 4 times per year    Active Member of [employer] or Organizations: Yes    Attends Engineer, structural: More than 4 times per year    Marital Status: Married    Tobacco Counseling Counseling given: Not Answered Tobacco comments: smoked in college--1 pack would last one week.    Clinical Intake:  Pre-visit preparation completed: Yes  Pain : No/denies pain     BMI - recorded: 30.6 Nutritional Status: BMI > 30  Obese Nutritional Risks: None Diabetes: Yes CBG done?: No Did pt. bring in CBG monitor from home?: No  Lab Results  Component Value Date   HGBA1C 6.6 (H) 02/11/2023   HGBA1C 6.1 (A) 09/25/2021  HGBA1C 6.2 (H) 05/04/2021     How often do you need to have someone help you when you read instructions, pamphlets, or other written materials from your doctor or pharmacy?: 1 - Never  Interpreter Needed?: No  Information entered by :: JHONNIE DAS, LPN   Activities of Daily Living     08/14/2023    2:43 PM 04/08/2023    6:43 AM  In your present state of health, do you have any difficulty performing the following activities:  Hearing? 1   Vision? 0   Difficulty concentrating or making decisions? 0   Comment LANGUAGE HESITATION   Walking or climbing stairs? 1   Comment  HARDER SINCE CVA   Dressing or bathing? 0   Doing errands, shopping? 0 0  Preparing Food and eating ? N   Using the Toilet? N   In the past six months, have you accidently leaked urine? N   Do you have problems with loss of bowel control? N   Managing your Medications? N   Managing your Finances? N   Housekeeping or managing your Housekeeping? N     Patient Care Team: Myrla Jon HERO, MD as PCP - General (Family Medicine) Perla Evalene PARAS, MD as PCP - Cardiology (Cardiology) Waddell Thom SAUNDERS, PA (Inactive) as Physician Assistant (Physician Assistant) Mardee Lynwood SQUIBB, MD as Consulting Physician (Orthopedic Surgery) Dessa Reyes ORN, MD (General Surgery) Dasher, Alm LABOR, MD (Dermatology) Rennie Cindy SAUNDERS, MD as Consulting Physician (Internal Medicine) Mittie Gaskin, MD as Referring Physician (Ophthalmology) Tamea Dedra CROME, MD as Consulting Physician (Pulmonary Disease) Kerrin Elspeth BROCKS, MD as Consulting Physician (Cardiothoracic Surgery)  I have updated your Care Teams any recent Medical Services you may have received from other providers in the past year.     Assessment:   This is a routine wellness examination for Kenna.  Hearing/Vision screen Hearing Screening - Comments:: HAS AIDS BUT DOESN'T WEAR THEM Vision Screening - Comments:: READERS- [facility]   Goals Addressed             This Visit's Progress    DIET - REDUCE SUGAR INTAKE         Depression Screen     08/14/2023    2:39 PM 05/14/2023    1:26 PM 03/15/2023   11:20 AM 05/01/2022    9:57 AM 04/23/2022    2:10 PM 09/25/2021    1:37 PM 05/16/2021   11:15 AM  PHQ 2/9 Scores  PHQ - 2 Score 0 2 1 3  0 0 2  PHQ- 9 Score 0 4 5 8   0 4    Fall Risk     08/14/2023    2:42 PM 05/01/2022    9:57 AM 04/23/2022    2:05 PM 09/25/2021    1:24 PM 03/21/2021    1:32 PM  Fall Risk   Falls in the past year? 0 0 0 0 1  Number falls in past yr: 0 0 0 0 0  Injury with Fall? 0 0 0 0 1  Risk for fall  due to : No Fall Risks No Fall Risks No Fall Risks No Fall Risks History of fall(s)  Follow up Falls evaluation completed;Falls prevention discussed Falls evaluation completed Education provided;Falls prevention discussed Falls evaluation completed  Falls prevention discussed      Data saved with a previous flowsheet row definition    MEDICARE RISK AT HOME:  Medicare Risk at Home Any stairs in or around the home?: Yes If so, are  there any without handrails?: No Home free of loose throw rugs in walkways, pet beds, electrical cords, etc?: Yes Adequate lighting in your home to reduce risk of falls?: Yes Life alert?: Yes Use of a cane, walker or w/c?: Yes (ROLLATOR ALL THE TIME) Grab bars in the bathroom?: Yes Shower chair or bench in shower?: Yes Elevated toilet seat or a handicapped toilet?: Yes  TIMED UP AND GO:  Was the test performed?  No  Cognitive Function: 6CIT completed        08/14/2023    2:45 PM 04/23/2022    2:21 PM 03/12/2018    2:24 PM 03/11/2017    9:40 AM  6CIT Screen  What Year? 0 points 0 points 0 points 0 points  What month? 0 points 0 points 0 points 0 points  What time? 0 points 0 points 0 points 0 points  Count back from 20 0 points 0 points 0 points 0 points  Months in reverse 0 points 0 points 2 points 0 points  Repeat phrase 0 points 0 points 0 points 0 points  Total Score 0 points 0 points 2 points 0 points    Immunizations Immunization History  Administered Date(s) Administered   Fluad Quad(high Dose 65+) 09/09/2018, 10/12/2019, 10/14/2020, 09/25/2021   Influenza Split 10/24/2011   Influenza, High Dose Seasonal PF 11/18/2013, 01/24/2016, 10/08/2016, 09/12/2017, 10/12/2022   Influenza-Unspecified 10/24/2011, 10/14/2012, 11/18/2013   Moderna Sars-Covid-2 Vaccination 01/30/2019, 03/02/2019   Pfizer(Comirnaty)Fall Seasonal Vaccine 12 years and older 10/12/2022   Pneumococcal Conjugate-13 08/21/2013   Pneumococcal Polysaccharide-23 01/24/2016    Respiratory Syncytial Virus Vaccine,Recomb Aduvanted(Arexvy) 11/11/2021   Tdap 10/24/2011   Zoster Recombinant(Shingrix) 04/01/2016, 08/14/2016    Screening Tests Health Maintenance  Topic Date Due   DTaP/Tdap/Td (2 - Td or Tdap) 10/23/2021   OPHTHALMOLOGY EXAM  11/22/2022   COVID-19 Vaccine (4 - Mixed Product risk 2024-25 season) 04/11/2023   HEMOGLOBIN A1C  08/11/2023   INFLUENZA VACCINE  08/16/2023   MAMMOGRAM  09/13/2023   FOOT EXAM  02/11/2024   Diabetic kidney evaluation - Urine ACR  02/25/2024   Diabetic kidney evaluation - eGFR measurement  05/09/2024   Medicare Annual Wellness (AWV)  08/13/2024   DEXA SCAN  05/03/2025   Pneumococcal Vaccine: 50+ Years  Completed   Zoster Vaccines- Shingrix  Completed   Hepatitis B Vaccines  Aged Out   HPV VACCINES  Aged Out   Meningococcal B Vaccine  Aged Out    Health Maintenance  Health Maintenance Due  Topic Date Due   DTaP/Tdap/Td (2 - Td or Tdap) 10/23/2021   OPHTHALMOLOGY EXAM  11/22/2022   COVID-19 Vaccine (4 - Mixed Product risk 2024-25 season) 04/11/2023   HEMOGLOBIN A1C  08/11/2023   Health Maintenance Items Addressed: NEEDS TDAP; UP TO DATE ON SHOTS; REFERRAL FOR MAMMOGRAM SENT; UP TO DATE ON BDS, AGED OUT OF COLONOSCOPY  Additional Screening:  Vision Screening: Recommended annual ophthalmology exams for early detection of glaucoma and other disorders of the eye. Would you like a referral to an eye doctor? No    Dental Screening: Recommended annual dental exams for proper oral hygiene  Community Resource Referral / Chronic Care Management: CRR required this visit?  No   CCM required this visit?  No   Plan:    I have personally reviewed and noted the following in the patient's chart:   Medical and social history Use of alcohol, tobacco or illicit drugs  Current medications and supplements including opioid prescriptions. Patient is not currently  taking opioid prescriptions. Functional ability and  status Nutritional status Physical activity Advanced directives List of other physicians Hospitalizations, surgeries, and ER visits in previous 12 months Vitals Screenings to include cognitive, depression, and falls Referrals and appointments  In addition, I have reviewed and discussed with patient certain preventive protocols, quality metrics, and best practice recommendations. A written personalized care plan for preventive services as well as general preventive health recommendations were provided to patient.   Jhonnie GORMAN Das, LPN   [id number]   After Visit Summary: (MyChart) Due to this being a telephonic visit, the after visit summary with patients personalized plan was offered to patient via MyChart   Notes: REFERRAL SENT FOR MAMMOGRAM

## 2023-08-19 DIAGNOSIS — G4733 Obstructive sleep apnea (adult) (pediatric): Secondary | ICD-10-CM | POA: Diagnosis not present

## 2023-08-19 DIAGNOSIS — Z741 Need for assistance with personal care: Secondary | ICD-10-CM | POA: Diagnosis not present

## 2023-08-19 DIAGNOSIS — I6522 Occlusion and stenosis of left carotid artery: Secondary | ICD-10-CM | POA: Diagnosis not present

## 2023-08-19 DIAGNOSIS — M6281 Muscle weakness (generalized): Secondary | ICD-10-CM | POA: Diagnosis not present

## 2023-08-19 DIAGNOSIS — M25551 Pain in right hip: Secondary | ICD-10-CM | POA: Diagnosis not present

## 2023-08-19 DIAGNOSIS — I63132 Cerebral infarction due to embolism of left carotid artery: Secondary | ICD-10-CM | POA: Diagnosis not present

## 2023-08-19 DIAGNOSIS — R2689 Other abnormalities of gait and mobility: Secondary | ICD-10-CM | POA: Diagnosis not present

## 2023-08-19 DIAGNOSIS — R2681 Unsteadiness on feet: Secondary | ICD-10-CM | POA: Diagnosis not present

## 2023-08-19 DIAGNOSIS — R278 Other lack of coordination: Secondary | ICD-10-CM | POA: Diagnosis not present

## 2023-08-19 DIAGNOSIS — R4189 Other symptoms and signs involving cognitive functions and awareness: Secondary | ICD-10-CM | POA: Diagnosis not present

## 2023-08-20 DIAGNOSIS — Z741 Need for assistance with personal care: Secondary | ICD-10-CM | POA: Diagnosis not present

## 2023-08-20 DIAGNOSIS — R278 Other lack of coordination: Secondary | ICD-10-CM | POA: Diagnosis not present

## 2023-08-20 DIAGNOSIS — I63132 Cerebral infarction due to embolism of left carotid artery: Secondary | ICD-10-CM | POA: Diagnosis not present

## 2023-08-20 DIAGNOSIS — M6281 Muscle weakness (generalized): Secondary | ICD-10-CM | POA: Diagnosis not present

## 2023-08-20 DIAGNOSIS — I6522 Occlusion and stenosis of left carotid artery: Secondary | ICD-10-CM | POA: Diagnosis not present

## 2023-08-20 DIAGNOSIS — R4189 Other symptoms and signs involving cognitive functions and awareness: Secondary | ICD-10-CM | POA: Diagnosis not present

## 2023-08-20 DIAGNOSIS — R2689 Other abnormalities of gait and mobility: Secondary | ICD-10-CM | POA: Diagnosis not present

## 2023-08-20 DIAGNOSIS — M25551 Pain in right hip: Secondary | ICD-10-CM | POA: Diagnosis not present

## 2023-08-20 DIAGNOSIS — R2681 Unsteadiness on feet: Secondary | ICD-10-CM | POA: Diagnosis not present

## 2023-08-21 ENCOUNTER — Other Ambulatory Visit: Payer: Self-pay | Admitting: Family Medicine

## 2023-08-21 DIAGNOSIS — F4322 Adjustment disorder with anxiety: Secondary | ICD-10-CM

## 2023-08-21 DIAGNOSIS — R2681 Unsteadiness on feet: Secondary | ICD-10-CM | POA: Diagnosis not present

## 2023-08-21 DIAGNOSIS — R4189 Other symptoms and signs involving cognitive functions and awareness: Secondary | ICD-10-CM | POA: Diagnosis not present

## 2023-08-21 DIAGNOSIS — I63132 Cerebral infarction due to embolism of left carotid artery: Secondary | ICD-10-CM | POA: Diagnosis not present

## 2023-08-21 DIAGNOSIS — M6281 Muscle weakness (generalized): Secondary | ICD-10-CM | POA: Diagnosis not present

## 2023-08-21 DIAGNOSIS — I6522 Occlusion and stenosis of left carotid artery: Secondary | ICD-10-CM | POA: Diagnosis not present

## 2023-08-21 DIAGNOSIS — R278 Other lack of coordination: Secondary | ICD-10-CM | POA: Diagnosis not present

## 2023-08-21 DIAGNOSIS — Z741 Need for assistance with personal care: Secondary | ICD-10-CM | POA: Diagnosis not present

## 2023-08-21 DIAGNOSIS — R2689 Other abnormalities of gait and mobility: Secondary | ICD-10-CM | POA: Diagnosis not present

## 2023-08-21 DIAGNOSIS — M25551 Pain in right hip: Secondary | ICD-10-CM | POA: Diagnosis not present

## 2023-08-22 DIAGNOSIS — R2689 Other abnormalities of gait and mobility: Secondary | ICD-10-CM | POA: Diagnosis not present

## 2023-08-22 DIAGNOSIS — I6522 Occlusion and stenosis of left carotid artery: Secondary | ICD-10-CM | POA: Diagnosis not present

## 2023-08-22 DIAGNOSIS — I63132 Cerebral infarction due to embolism of left carotid artery: Secondary | ICD-10-CM | POA: Diagnosis not present

## 2023-08-22 DIAGNOSIS — R278 Other lack of coordination: Secondary | ICD-10-CM | POA: Diagnosis not present

## 2023-08-22 DIAGNOSIS — M25551 Pain in right hip: Secondary | ICD-10-CM | POA: Diagnosis not present

## 2023-08-22 DIAGNOSIS — M6281 Muscle weakness (generalized): Secondary | ICD-10-CM | POA: Diagnosis not present

## 2023-08-22 DIAGNOSIS — Z741 Need for assistance with personal care: Secondary | ICD-10-CM | POA: Diagnosis not present

## 2023-08-22 DIAGNOSIS — R4189 Other symptoms and signs involving cognitive functions and awareness: Secondary | ICD-10-CM | POA: Diagnosis not present

## 2023-08-22 DIAGNOSIS — R2681 Unsteadiness on feet: Secondary | ICD-10-CM | POA: Diagnosis not present

## 2023-08-26 DIAGNOSIS — M6281 Muscle weakness (generalized): Secondary | ICD-10-CM | POA: Diagnosis not present

## 2023-08-26 DIAGNOSIS — M25551 Pain in right hip: Secondary | ICD-10-CM | POA: Diagnosis not present

## 2023-08-26 DIAGNOSIS — I6522 Occlusion and stenosis of left carotid artery: Secondary | ICD-10-CM | POA: Diagnosis not present

## 2023-08-26 DIAGNOSIS — Z741 Need for assistance with personal care: Secondary | ICD-10-CM | POA: Diagnosis not present

## 2023-08-26 DIAGNOSIS — R2689 Other abnormalities of gait and mobility: Secondary | ICD-10-CM | POA: Diagnosis not present

## 2023-08-26 DIAGNOSIS — R2681 Unsteadiness on feet: Secondary | ICD-10-CM | POA: Diagnosis not present

## 2023-08-26 DIAGNOSIS — R278 Other lack of coordination: Secondary | ICD-10-CM | POA: Diagnosis not present

## 2023-08-26 DIAGNOSIS — I63132 Cerebral infarction due to embolism of left carotid artery: Secondary | ICD-10-CM | POA: Diagnosis not present

## 2023-08-26 DIAGNOSIS — R4189 Other symptoms and signs involving cognitive functions and awareness: Secondary | ICD-10-CM | POA: Diagnosis not present

## 2023-08-27 DIAGNOSIS — R4189 Other symptoms and signs involving cognitive functions and awareness: Secondary | ICD-10-CM | POA: Diagnosis not present

## 2023-08-27 DIAGNOSIS — M6281 Muscle weakness (generalized): Secondary | ICD-10-CM | POA: Diagnosis not present

## 2023-08-27 DIAGNOSIS — R278 Other lack of coordination: Secondary | ICD-10-CM | POA: Diagnosis not present

## 2023-08-27 DIAGNOSIS — M25551 Pain in right hip: Secondary | ICD-10-CM | POA: Diagnosis not present

## 2023-08-27 DIAGNOSIS — Z741 Need for assistance with personal care: Secondary | ICD-10-CM | POA: Diagnosis not present

## 2023-08-27 DIAGNOSIS — I63132 Cerebral infarction due to embolism of left carotid artery: Secondary | ICD-10-CM | POA: Diagnosis not present

## 2023-08-27 DIAGNOSIS — I6522 Occlusion and stenosis of left carotid artery: Secondary | ICD-10-CM | POA: Diagnosis not present

## 2023-08-27 DIAGNOSIS — R2689 Other abnormalities of gait and mobility: Secondary | ICD-10-CM | POA: Diagnosis not present

## 2023-08-27 DIAGNOSIS — R2681 Unsteadiness on feet: Secondary | ICD-10-CM | POA: Diagnosis not present

## 2023-08-29 DIAGNOSIS — I63132 Cerebral infarction due to embolism of left carotid artery: Secondary | ICD-10-CM | POA: Diagnosis not present

## 2023-08-29 DIAGNOSIS — R4189 Other symptoms and signs involving cognitive functions and awareness: Secondary | ICD-10-CM | POA: Diagnosis not present

## 2023-08-29 DIAGNOSIS — I6522 Occlusion and stenosis of left carotid artery: Secondary | ICD-10-CM | POA: Diagnosis not present

## 2023-08-29 DIAGNOSIS — M25551 Pain in right hip: Secondary | ICD-10-CM | POA: Diagnosis not present

## 2023-08-29 DIAGNOSIS — R2681 Unsteadiness on feet: Secondary | ICD-10-CM | POA: Diagnosis not present

## 2023-08-29 DIAGNOSIS — R278 Other lack of coordination: Secondary | ICD-10-CM | POA: Diagnosis not present

## 2023-08-29 DIAGNOSIS — R2689 Other abnormalities of gait and mobility: Secondary | ICD-10-CM | POA: Diagnosis not present

## 2023-08-29 DIAGNOSIS — M6281 Muscle weakness (generalized): Secondary | ICD-10-CM | POA: Diagnosis not present

## 2023-08-29 DIAGNOSIS — Z741 Need for assistance with personal care: Secondary | ICD-10-CM | POA: Diagnosis not present

## 2023-09-02 DIAGNOSIS — R4189 Other symptoms and signs involving cognitive functions and awareness: Secondary | ICD-10-CM | POA: Diagnosis not present

## 2023-09-02 DIAGNOSIS — I63132 Cerebral infarction due to embolism of left carotid artery: Secondary | ICD-10-CM | POA: Diagnosis not present

## 2023-09-02 DIAGNOSIS — M25551 Pain in right hip: Secondary | ICD-10-CM | POA: Diagnosis not present

## 2023-09-02 DIAGNOSIS — R2689 Other abnormalities of gait and mobility: Secondary | ICD-10-CM | POA: Diagnosis not present

## 2023-09-02 DIAGNOSIS — Z741 Need for assistance with personal care: Secondary | ICD-10-CM | POA: Diagnosis not present

## 2023-09-02 DIAGNOSIS — R278 Other lack of coordination: Secondary | ICD-10-CM | POA: Diagnosis not present

## 2023-09-02 DIAGNOSIS — R2681 Unsteadiness on feet: Secondary | ICD-10-CM | POA: Diagnosis not present

## 2023-09-02 DIAGNOSIS — M6281 Muscle weakness (generalized): Secondary | ICD-10-CM | POA: Diagnosis not present

## 2023-09-02 DIAGNOSIS — I6522 Occlusion and stenosis of left carotid artery: Secondary | ICD-10-CM | POA: Diagnosis not present

## 2023-09-03 DIAGNOSIS — M25551 Pain in right hip: Secondary | ICD-10-CM | POA: Diagnosis not present

## 2023-09-03 DIAGNOSIS — M6281 Muscle weakness (generalized): Secondary | ICD-10-CM | POA: Diagnosis not present

## 2023-09-03 DIAGNOSIS — I6522 Occlusion and stenosis of left carotid artery: Secondary | ICD-10-CM | POA: Diagnosis not present

## 2023-09-03 DIAGNOSIS — R2681 Unsteadiness on feet: Secondary | ICD-10-CM | POA: Diagnosis not present

## 2023-09-03 DIAGNOSIS — I63132 Cerebral infarction due to embolism of left carotid artery: Secondary | ICD-10-CM | POA: Diagnosis not present

## 2023-09-03 DIAGNOSIS — Z741 Need for assistance with personal care: Secondary | ICD-10-CM | POA: Diagnosis not present

## 2023-09-03 DIAGNOSIS — R4189 Other symptoms and signs involving cognitive functions and awareness: Secondary | ICD-10-CM | POA: Diagnosis not present

## 2023-09-03 DIAGNOSIS — R278 Other lack of coordination: Secondary | ICD-10-CM | POA: Diagnosis not present

## 2023-09-03 DIAGNOSIS — R2689 Other abnormalities of gait and mobility: Secondary | ICD-10-CM | POA: Diagnosis not present

## 2023-09-04 DIAGNOSIS — I6522 Occlusion and stenosis of left carotid artery: Secondary | ICD-10-CM | POA: Diagnosis not present

## 2023-09-04 DIAGNOSIS — R278 Other lack of coordination: Secondary | ICD-10-CM | POA: Diagnosis not present

## 2023-09-04 DIAGNOSIS — R2689 Other abnormalities of gait and mobility: Secondary | ICD-10-CM | POA: Diagnosis not present

## 2023-09-04 DIAGNOSIS — M6281 Muscle weakness (generalized): Secondary | ICD-10-CM | POA: Diagnosis not present

## 2023-09-04 DIAGNOSIS — R2681 Unsteadiness on feet: Secondary | ICD-10-CM | POA: Diagnosis not present

## 2023-09-04 DIAGNOSIS — Z741 Need for assistance with personal care: Secondary | ICD-10-CM | POA: Diagnosis not present

## 2023-09-04 DIAGNOSIS — I63132 Cerebral infarction due to embolism of left carotid artery: Secondary | ICD-10-CM | POA: Diagnosis not present

## 2023-09-04 DIAGNOSIS — R4189 Other symptoms and signs involving cognitive functions and awareness: Secondary | ICD-10-CM | POA: Diagnosis not present

## 2023-09-04 DIAGNOSIS — M25551 Pain in right hip: Secondary | ICD-10-CM | POA: Diagnosis not present

## 2023-09-05 DIAGNOSIS — I6522 Occlusion and stenosis of left carotid artery: Secondary | ICD-10-CM | POA: Diagnosis not present

## 2023-09-05 DIAGNOSIS — R2689 Other abnormalities of gait and mobility: Secondary | ICD-10-CM | POA: Diagnosis not present

## 2023-09-05 DIAGNOSIS — M6281 Muscle weakness (generalized): Secondary | ICD-10-CM | POA: Diagnosis not present

## 2023-09-05 DIAGNOSIS — Z741 Need for assistance with personal care: Secondary | ICD-10-CM | POA: Diagnosis not present

## 2023-09-05 DIAGNOSIS — R2681 Unsteadiness on feet: Secondary | ICD-10-CM | POA: Diagnosis not present

## 2023-09-05 DIAGNOSIS — R278 Other lack of coordination: Secondary | ICD-10-CM | POA: Diagnosis not present

## 2023-09-05 DIAGNOSIS — R4189 Other symptoms and signs involving cognitive functions and awareness: Secondary | ICD-10-CM | POA: Diagnosis not present

## 2023-09-05 DIAGNOSIS — I63132 Cerebral infarction due to embolism of left carotid artery: Secondary | ICD-10-CM | POA: Diagnosis not present

## 2023-09-05 DIAGNOSIS — M25551 Pain in right hip: Secondary | ICD-10-CM | POA: Diagnosis not present

## 2023-09-09 DIAGNOSIS — I6522 Occlusion and stenosis of left carotid artery: Secondary | ICD-10-CM | POA: Diagnosis not present

## 2023-09-09 DIAGNOSIS — R2689 Other abnormalities of gait and mobility: Secondary | ICD-10-CM | POA: Diagnosis not present

## 2023-09-09 DIAGNOSIS — R4189 Other symptoms and signs involving cognitive functions and awareness: Secondary | ICD-10-CM | POA: Diagnosis not present

## 2023-09-09 DIAGNOSIS — M25551 Pain in right hip: Secondary | ICD-10-CM | POA: Diagnosis not present

## 2023-09-09 DIAGNOSIS — Z741 Need for assistance with personal care: Secondary | ICD-10-CM | POA: Diagnosis not present

## 2023-09-09 DIAGNOSIS — R2681 Unsteadiness on feet: Secondary | ICD-10-CM | POA: Diagnosis not present

## 2023-09-09 DIAGNOSIS — R278 Other lack of coordination: Secondary | ICD-10-CM | POA: Diagnosis not present

## 2023-09-09 DIAGNOSIS — I63132 Cerebral infarction due to embolism of left carotid artery: Secondary | ICD-10-CM | POA: Diagnosis not present

## 2023-09-09 DIAGNOSIS — M6281 Muscle weakness (generalized): Secondary | ICD-10-CM | POA: Diagnosis not present

## 2023-09-10 DIAGNOSIS — R2689 Other abnormalities of gait and mobility: Secondary | ICD-10-CM | POA: Diagnosis not present

## 2023-09-10 DIAGNOSIS — R278 Other lack of coordination: Secondary | ICD-10-CM | POA: Diagnosis not present

## 2023-09-10 DIAGNOSIS — M6281 Muscle weakness (generalized): Secondary | ICD-10-CM | POA: Diagnosis not present

## 2023-09-10 DIAGNOSIS — I63132 Cerebral infarction due to embolism of left carotid artery: Secondary | ICD-10-CM | POA: Diagnosis not present

## 2023-09-10 DIAGNOSIS — I6522 Occlusion and stenosis of left carotid artery: Secondary | ICD-10-CM | POA: Diagnosis not present

## 2023-09-10 DIAGNOSIS — M25551 Pain in right hip: Secondary | ICD-10-CM | POA: Diagnosis not present

## 2023-09-10 DIAGNOSIS — Z741 Need for assistance with personal care: Secondary | ICD-10-CM | POA: Diagnosis not present

## 2023-09-10 DIAGNOSIS — R4189 Other symptoms and signs involving cognitive functions and awareness: Secondary | ICD-10-CM | POA: Diagnosis not present

## 2023-09-10 DIAGNOSIS — R2681 Unsteadiness on feet: Secondary | ICD-10-CM | POA: Diagnosis not present

## 2023-09-10 IMAGING — CT CT HEART MORP W/ CTA COR W/ SCORE W/ CA W/CM &/OR W/O CM
1 of 13 series · 4 of 20 positions shown, 5 images · non-contrast
Comparison: May 04, 2021.

Addendum:
CLINICAL DATA: Shortness of breath

EXAM:
Cardiac/Coronary  CTA
TECHNIQUE: The patient was scanned on a Siemens Somatom go.Top scanner.

[Series 27: multiphase % cta coronary 0.60 · axial · 0.34mm/px · z∈[-1115,-1042]mm · 4 of 3660 slices shown, 5 images]
[im 732/3660  vessel]
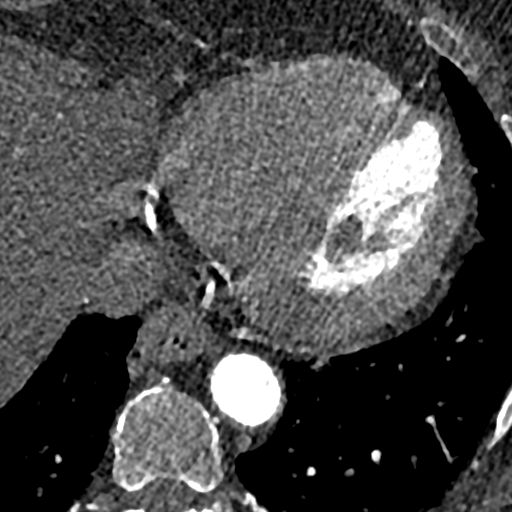
[im 732/3660  lung]
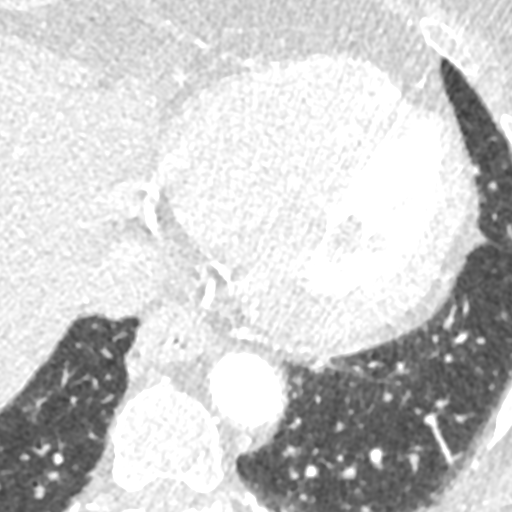
[im 1464/3660  vessel]
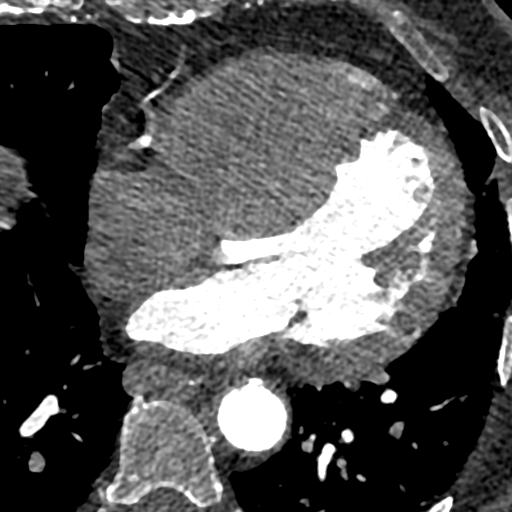
[im 2196/3660  vessel]
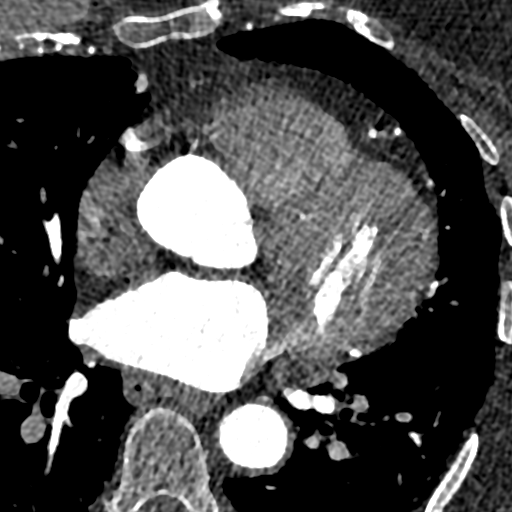
[im 2928/3660  vessel]
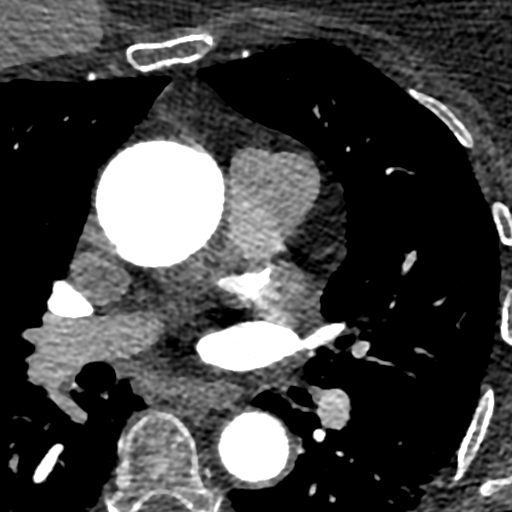

[4 of 20 positions shown; findings below may reference images not displayed]

:
A retrospective scan was triggered in the descending thoracic aorta.
Axial non-contrast 3 mm slices were carried out through the heart.
The data set was analyzed on a dedicated work station and scored
using the Agatson method. Gantry rotation speed was 330 msecs and
collimation was .6 mm. 100mg of metoprolol and 0.8 mg of sl NTG was
given. The 3D data set was reconstructed in 5% intervals of the
60-95 % of the R-R cycle. Diastolic phases were analyzed on a
dedicated work station using MPR, MIP and VRT modes. The patient
received 75 cc of contrast.
FINDINGS: Aorta: Mild to moderately dilated ascending aorta, measuring 44 x 43
mm. Minimal ascending and descending aorta calcifications. No
dissection.

Aortic Valve:  Trileaflet.  No calcifications.

Coronary Arteries:  Normal coronary origin.  Right dominance.

RCA is a dominant artery that gives rise to PDA and PLA. There is no
plaque.

Left main gives rise to LAD and LCX arteries. There is no LM
disease.

LAD  has no plaque.

LCX is a non-dominant artery that gives rise to two obtuse marginal
branches. There is no plaque.

Other findings:

Normal pulmonary vein drainage into the left atrium.

Normal left atrial appendage without a thrombus.

Normal size of the pulmonary artery.
IMPRESSION: 1. Normal coronary calcium score of 0. Patient is low risk for
coronary events.

2. Normal coronary origin with right dominance.

3. No evidence of CAD.

4. CAD-RADS 0. Consider non-atherosclerotic causes of chest pain or
shortness of breath.

5. Mild to moderately dilated ascending aorta, measuring 44 x 43 mm.

EXAM:
OVER-READ INTERPRETATION  CT CHEST

The following report is an over-read performed by radiologist Dr.
over-read does not include interpretation of cardiac or coronary
anatomy or pathology. The CTA interpretation by the cardiologist is
attached.
FINDINGS: Visualized skeleton is unremarkable. 4.6 cm ascending thoracic
aortic aneurysm is noted. No dissection is noted. Small sliding-type
hiatal hernia is noted. No other abnormality seen in the visualized
mediastinum. Visualized portion of upper abdomen is unremarkable.
Visualized pulmonary parenchyma is unremarkable.
IMPRESSION: 4.6 cm ascending thoracic aortic aneurysm. Recommend semi-annual
imaging followup by CTA or MRA and referral to cardiothoracic
surgery if not already obtained. This recommendation follows 3707
ACCF/AHA/AATS/ACR/ASA/SCA/TORI/NARKOZ/MMN/MOISE Guidelines for the
Diagnosis and Management of Patients With Thoracic Aortic Disease.
Circulation. 3707; 121: E266-e369. Aortic aneurysm NOS (NJOPN-HQC.5)

*** End of Addendum ***
:
A retrospective scan was triggered in the descending thoracic aorta.
Axial non-contrast 3 mm slices were carried out through the heart.
The data set was analyzed on a dedicated work station and scored
using the Agatson method. Gantry rotation speed was 330 msecs and
collimation was .6 mm. 100mg of metoprolol and 0.8 mg of sl NTG was
given. The 3D data set was reconstructed in 5% intervals of the
60-95 % of the R-R cycle. Diastolic phases were analyzed on a
dedicated work station using MPR, MIP and VRT modes. The patient
received 75 cc of contrast.
FINDINGS: Aorta: Mild to moderately dilated ascending aorta, measuring 44 x 43
mm. Minimal ascending and descending aorta calcifications. No
dissection.

Aortic Valve:  Trileaflet.  No calcifications.

Coronary Arteries:  Normal coronary origin.  Right dominance.

RCA is a dominant artery that gives rise to PDA and PLA. There is no
plaque.

Left main gives rise to LAD and LCX arteries. There is no LM
disease.

LAD  has no plaque.

LCX is a non-dominant artery that gives rise to two obtuse marginal
branches. There is no plaque.

Other findings:

Normal pulmonary vein drainage into the left atrium.

Normal left atrial appendage without a thrombus.

Normal size of the pulmonary artery.
IMPRESSION: 1. Normal coronary calcium score of 0. Patient is low risk for
coronary events.

2. Normal coronary origin with right dominance.

3. No evidence of CAD.

4. CAD-RADS 0. Consider non-atherosclerotic causes of chest pain or
shortness of breath.

5. Mild to moderately dilated ascending aorta, measuring 44 x 43 mm.

## 2023-09-11 ENCOUNTER — Other Ambulatory Visit: Payer: Self-pay | Admitting: Family Medicine

## 2023-09-11 DIAGNOSIS — M6281 Muscle weakness (generalized): Secondary | ICD-10-CM | POA: Diagnosis not present

## 2023-09-11 DIAGNOSIS — R2681 Unsteadiness on feet: Secondary | ICD-10-CM | POA: Diagnosis not present

## 2023-09-11 DIAGNOSIS — M25551 Pain in right hip: Secondary | ICD-10-CM | POA: Diagnosis not present

## 2023-09-11 DIAGNOSIS — I63132 Cerebral infarction due to embolism of left carotid artery: Secondary | ICD-10-CM | POA: Diagnosis not present

## 2023-09-11 DIAGNOSIS — I6522 Occlusion and stenosis of left carotid artery: Secondary | ICD-10-CM | POA: Diagnosis not present

## 2023-09-11 DIAGNOSIS — R278 Other lack of coordination: Secondary | ICD-10-CM | POA: Diagnosis not present

## 2023-09-11 DIAGNOSIS — R2689 Other abnormalities of gait and mobility: Secondary | ICD-10-CM | POA: Diagnosis not present

## 2023-09-11 DIAGNOSIS — R4189 Other symptoms and signs involving cognitive functions and awareness: Secondary | ICD-10-CM | POA: Diagnosis not present

## 2023-09-11 DIAGNOSIS — Z741 Need for assistance with personal care: Secondary | ICD-10-CM | POA: Diagnosis not present

## 2023-09-12 DIAGNOSIS — M6281 Muscle weakness (generalized): Secondary | ICD-10-CM | POA: Diagnosis not present

## 2023-09-12 DIAGNOSIS — R4189 Other symptoms and signs involving cognitive functions and awareness: Secondary | ICD-10-CM | POA: Diagnosis not present

## 2023-09-12 DIAGNOSIS — R2689 Other abnormalities of gait and mobility: Secondary | ICD-10-CM | POA: Diagnosis not present

## 2023-09-12 DIAGNOSIS — I63132 Cerebral infarction due to embolism of left carotid artery: Secondary | ICD-10-CM | POA: Diagnosis not present

## 2023-09-12 DIAGNOSIS — R278 Other lack of coordination: Secondary | ICD-10-CM | POA: Diagnosis not present

## 2023-09-12 DIAGNOSIS — Z741 Need for assistance with personal care: Secondary | ICD-10-CM | POA: Diagnosis not present

## 2023-09-12 DIAGNOSIS — R2681 Unsteadiness on feet: Secondary | ICD-10-CM | POA: Diagnosis not present

## 2023-09-12 DIAGNOSIS — M25551 Pain in right hip: Secondary | ICD-10-CM | POA: Diagnosis not present

## 2023-09-12 DIAGNOSIS — I6522 Occlusion and stenosis of left carotid artery: Secondary | ICD-10-CM | POA: Diagnosis not present

## 2023-09-13 NOTE — Telephone Encounter (Signed)
 Requested medications are due for refill today.  unsure  Requested medications are on the active medications list.  no  Last refill. 04/05/2023 #15 0 rf  Future visit scheduled.   yes  Notes to clinic.  Rx written to expire 04/10/2023. Refill not delegated.    Requested Prescriptions  Pending Prescriptions Disp Refills   traMADol  (ULTRAM ) 50 MG tablet [Pharmacy Med Name: TRAMADOL  HCL 50 MG TABLET] 15 tablet 0    Sig: TAKE 1 TABLET BY MOUTH EVERY 8 HOURS AS NEEDED FOR UP TO 5 DAYS FOR SEVERE PAIN (PAIN SCORE 7-10).     Not Delegated - Analgesics:  Opioid Agonists Failed - 09/13/2023 12:50 PM      Failed - This refill cannot be delegated      Failed - Urine Drug Screen completed in last 360 days      Passed - Valid encounter within last 3 months    Recent Outpatient Visits           4 months ago Encounter for annual physical exam   [facility] [place], Jon HERO, MD   5 months ago Pain of right hip   [facility] Leavy Mole, PA-C   6 months ago ICAO (internal carotid artery occlusion), left   [facility] [place], Jon HERO, MD       Future Appointments             In 1 month Wittenborn, Barnie, NP [facility]

## 2023-09-16 DIAGNOSIS — R2681 Unsteadiness on feet: Secondary | ICD-10-CM | POA: Diagnosis not present

## 2023-09-16 DIAGNOSIS — R2689 Other abnormalities of gait and mobility: Secondary | ICD-10-CM | POA: Diagnosis not present

## 2023-09-16 DIAGNOSIS — R4189 Other symptoms and signs involving cognitive functions and awareness: Secondary | ICD-10-CM | POA: Diagnosis not present

## 2023-09-16 DIAGNOSIS — R278 Other lack of coordination: Secondary | ICD-10-CM | POA: Diagnosis not present

## 2023-09-16 DIAGNOSIS — Z741 Need for assistance with personal care: Secondary | ICD-10-CM | POA: Diagnosis not present

## 2023-09-16 DIAGNOSIS — M25551 Pain in right hip: Secondary | ICD-10-CM | POA: Diagnosis not present

## 2023-09-16 DIAGNOSIS — I63132 Cerebral infarction due to embolism of left carotid artery: Secondary | ICD-10-CM | POA: Diagnosis not present

## 2023-09-16 DIAGNOSIS — M6281 Muscle weakness (generalized): Secondary | ICD-10-CM | POA: Diagnosis not present

## 2023-09-16 DIAGNOSIS — I6522 Occlusion and stenosis of left carotid artery: Secondary | ICD-10-CM | POA: Diagnosis not present

## 2023-09-17 DIAGNOSIS — R2689 Other abnormalities of gait and mobility: Secondary | ICD-10-CM | POA: Diagnosis not present

## 2023-09-17 DIAGNOSIS — R2681 Unsteadiness on feet: Secondary | ICD-10-CM | POA: Diagnosis not present

## 2023-09-17 DIAGNOSIS — I63132 Cerebral infarction due to embolism of left carotid artery: Secondary | ICD-10-CM | POA: Diagnosis not present

## 2023-09-17 DIAGNOSIS — R4189 Other symptoms and signs involving cognitive functions and awareness: Secondary | ICD-10-CM | POA: Diagnosis not present

## 2023-09-17 DIAGNOSIS — M25551 Pain in right hip: Secondary | ICD-10-CM | POA: Diagnosis not present

## 2023-09-17 DIAGNOSIS — R278 Other lack of coordination: Secondary | ICD-10-CM | POA: Diagnosis not present

## 2023-09-17 DIAGNOSIS — M6281 Muscle weakness (generalized): Secondary | ICD-10-CM | POA: Diagnosis not present

## 2023-09-17 DIAGNOSIS — Z741 Need for assistance with personal care: Secondary | ICD-10-CM | POA: Diagnosis not present

## 2023-09-17 DIAGNOSIS — I6522 Occlusion and stenosis of left carotid artery: Secondary | ICD-10-CM | POA: Diagnosis not present

## 2023-09-18 DIAGNOSIS — I6522 Occlusion and stenosis of left carotid artery: Secondary | ICD-10-CM | POA: Diagnosis not present

## 2023-09-18 DIAGNOSIS — M25551 Pain in right hip: Secondary | ICD-10-CM | POA: Diagnosis not present

## 2023-09-18 DIAGNOSIS — R2689 Other abnormalities of gait and mobility: Secondary | ICD-10-CM | POA: Diagnosis not present

## 2023-09-18 DIAGNOSIS — Z741 Need for assistance with personal care: Secondary | ICD-10-CM | POA: Diagnosis not present

## 2023-09-18 DIAGNOSIS — R4189 Other symptoms and signs involving cognitive functions and awareness: Secondary | ICD-10-CM | POA: Diagnosis not present

## 2023-09-18 DIAGNOSIS — R2681 Unsteadiness on feet: Secondary | ICD-10-CM | POA: Diagnosis not present

## 2023-09-19 DIAGNOSIS — R2689 Other abnormalities of gait and mobility: Secondary | ICD-10-CM | POA: Diagnosis not present

## 2023-09-19 DIAGNOSIS — Z741 Need for assistance with personal care: Secondary | ICD-10-CM | POA: Diagnosis not present

## 2023-09-19 DIAGNOSIS — I63132 Cerebral infarction due to embolism of left carotid artery: Secondary | ICD-10-CM | POA: Diagnosis not present

## 2023-09-19 DIAGNOSIS — R4189 Other symptoms and signs involving cognitive functions and awareness: Secondary | ICD-10-CM | POA: Diagnosis not present

## 2023-09-19 DIAGNOSIS — I6522 Occlusion and stenosis of left carotid artery: Secondary | ICD-10-CM | POA: Diagnosis not present

## 2023-09-19 DIAGNOSIS — R278 Other lack of coordination: Secondary | ICD-10-CM | POA: Diagnosis not present

## 2023-09-19 DIAGNOSIS — G4733 Obstructive sleep apnea (adult) (pediatric): Secondary | ICD-10-CM | POA: Diagnosis not present

## 2023-09-19 DIAGNOSIS — R2681 Unsteadiness on feet: Secondary | ICD-10-CM | POA: Diagnosis not present

## 2023-09-19 DIAGNOSIS — M6281 Muscle weakness (generalized): Secondary | ICD-10-CM | POA: Diagnosis not present

## 2023-09-23 DIAGNOSIS — M6281 Muscle weakness (generalized): Secondary | ICD-10-CM | POA: Diagnosis not present

## 2023-09-23 DIAGNOSIS — R278 Other lack of coordination: Secondary | ICD-10-CM | POA: Diagnosis not present

## 2023-09-23 DIAGNOSIS — I6522 Occlusion and stenosis of left carotid artery: Secondary | ICD-10-CM | POA: Diagnosis not present

## 2023-09-23 DIAGNOSIS — I63132 Cerebral infarction due to embolism of left carotid artery: Secondary | ICD-10-CM | POA: Diagnosis not present

## 2023-09-23 DIAGNOSIS — R2689 Other abnormalities of gait and mobility: Secondary | ICD-10-CM | POA: Diagnosis not present

## 2023-09-23 DIAGNOSIS — M25551 Pain in right hip: Secondary | ICD-10-CM | POA: Diagnosis not present

## 2023-09-23 DIAGNOSIS — R2681 Unsteadiness on feet: Secondary | ICD-10-CM | POA: Diagnosis not present

## 2023-09-23 DIAGNOSIS — R4189 Other symptoms and signs involving cognitive functions and awareness: Secondary | ICD-10-CM | POA: Diagnosis not present

## 2023-09-23 DIAGNOSIS — Z741 Need for assistance with personal care: Secondary | ICD-10-CM | POA: Diagnosis not present

## 2023-09-24 ENCOUNTER — Other Ambulatory Visit: Payer: Self-pay | Admitting: Thoracic Surgery (Cardiothoracic Vascular Surgery)

## 2023-09-24 DIAGNOSIS — I7121 Aneurysm of the ascending aorta, without rupture: Secondary | ICD-10-CM

## 2023-09-24 DIAGNOSIS — R2681 Unsteadiness on feet: Secondary | ICD-10-CM | POA: Diagnosis not present

## 2023-09-25 DIAGNOSIS — I6522 Occlusion and stenosis of left carotid artery: Secondary | ICD-10-CM | POA: Diagnosis not present

## 2023-09-25 DIAGNOSIS — R278 Other lack of coordination: Secondary | ICD-10-CM | POA: Diagnosis not present

## 2023-09-25 DIAGNOSIS — R4189 Other symptoms and signs involving cognitive functions and awareness: Secondary | ICD-10-CM | POA: Diagnosis not present

## 2023-09-25 DIAGNOSIS — R2689 Other abnormalities of gait and mobility: Secondary | ICD-10-CM | POA: Diagnosis not present

## 2023-09-25 DIAGNOSIS — M25551 Pain in right hip: Secondary | ICD-10-CM | POA: Diagnosis not present

## 2023-09-25 DIAGNOSIS — Z741 Need for assistance with personal care: Secondary | ICD-10-CM | POA: Diagnosis not present

## 2023-09-25 DIAGNOSIS — R2681 Unsteadiness on feet: Secondary | ICD-10-CM | POA: Diagnosis not present

## 2023-09-25 DIAGNOSIS — M6281 Muscle weakness (generalized): Secondary | ICD-10-CM | POA: Diagnosis not present

## 2023-09-26 DIAGNOSIS — M6281 Muscle weakness (generalized): Secondary | ICD-10-CM | POA: Diagnosis not present

## 2023-09-26 DIAGNOSIS — R2681 Unsteadiness on feet: Secondary | ICD-10-CM | POA: Diagnosis not present

## 2023-09-26 DIAGNOSIS — R2689 Other abnormalities of gait and mobility: Secondary | ICD-10-CM | POA: Diagnosis not present

## 2023-09-26 DIAGNOSIS — I63132 Cerebral infarction due to embolism of left carotid artery: Secondary | ICD-10-CM | POA: Diagnosis not present

## 2023-09-26 DIAGNOSIS — R278 Other lack of coordination: Secondary | ICD-10-CM | POA: Diagnosis not present

## 2023-09-26 DIAGNOSIS — Z741 Need for assistance with personal care: Secondary | ICD-10-CM | POA: Diagnosis not present

## 2023-09-26 DIAGNOSIS — R4189 Other symptoms and signs involving cognitive functions and awareness: Secondary | ICD-10-CM | POA: Diagnosis not present

## 2023-09-26 DIAGNOSIS — M25551 Pain in right hip: Secondary | ICD-10-CM | POA: Diagnosis not present

## 2023-10-01 DIAGNOSIS — R2681 Unsteadiness on feet: Secondary | ICD-10-CM | POA: Diagnosis not present

## 2023-10-02 DIAGNOSIS — M6281 Muscle weakness (generalized): Secondary | ICD-10-CM | POA: Diagnosis not present

## 2023-10-02 DIAGNOSIS — R278 Other lack of coordination: Secondary | ICD-10-CM | POA: Diagnosis not present

## 2023-10-02 DIAGNOSIS — R2689 Other abnormalities of gait and mobility: Secondary | ICD-10-CM | POA: Diagnosis not present

## 2023-10-02 DIAGNOSIS — I63132 Cerebral infarction due to embolism of left carotid artery: Secondary | ICD-10-CM | POA: Diagnosis not present

## 2023-10-02 DIAGNOSIS — R2681 Unsteadiness on feet: Secondary | ICD-10-CM | POA: Diagnosis not present

## 2023-10-02 DIAGNOSIS — R4189 Other symptoms and signs involving cognitive functions and awareness: Secondary | ICD-10-CM | POA: Diagnosis not present

## 2023-10-02 DIAGNOSIS — I6522 Occlusion and stenosis of left carotid artery: Secondary | ICD-10-CM | POA: Diagnosis not present

## 2023-10-02 DIAGNOSIS — Z741 Need for assistance with personal care: Secondary | ICD-10-CM | POA: Diagnosis not present

## 2023-10-04 DIAGNOSIS — Z741 Need for assistance with personal care: Secondary | ICD-10-CM | POA: Diagnosis not present

## 2023-10-04 DIAGNOSIS — R2689 Other abnormalities of gait and mobility: Secondary | ICD-10-CM | POA: Diagnosis not present

## 2023-10-04 DIAGNOSIS — M6281 Muscle weakness (generalized): Secondary | ICD-10-CM | POA: Diagnosis not present

## 2023-10-04 DIAGNOSIS — R278 Other lack of coordination: Secondary | ICD-10-CM | POA: Diagnosis not present

## 2023-10-04 DIAGNOSIS — I6522 Occlusion and stenosis of left carotid artery: Secondary | ICD-10-CM | POA: Diagnosis not present

## 2023-10-04 DIAGNOSIS — R2681 Unsteadiness on feet: Secondary | ICD-10-CM | POA: Diagnosis not present

## 2023-10-04 DIAGNOSIS — M25551 Pain in right hip: Secondary | ICD-10-CM | POA: Diagnosis not present

## 2023-10-04 DIAGNOSIS — I63132 Cerebral infarction due to embolism of left carotid artery: Secondary | ICD-10-CM | POA: Diagnosis not present

## 2023-10-07 ENCOUNTER — Ambulatory Visit: Payer: Self-pay

## 2023-10-07 DIAGNOSIS — M6281 Muscle weakness (generalized): Secondary | ICD-10-CM | POA: Diagnosis not present

## 2023-10-07 DIAGNOSIS — R2681 Unsteadiness on feet: Secondary | ICD-10-CM | POA: Diagnosis not present

## 2023-10-07 DIAGNOSIS — R4189 Other symptoms and signs involving cognitive functions and awareness: Secondary | ICD-10-CM | POA: Diagnosis not present

## 2023-10-07 DIAGNOSIS — M25551 Pain in right hip: Secondary | ICD-10-CM | POA: Diagnosis not present

## 2023-10-07 DIAGNOSIS — I6522 Occlusion and stenosis of left carotid artery: Secondary | ICD-10-CM | POA: Diagnosis not present

## 2023-10-07 DIAGNOSIS — I63132 Cerebral infarction due to embolism of left carotid artery: Secondary | ICD-10-CM | POA: Diagnosis not present

## 2023-10-07 DIAGNOSIS — R278 Other lack of coordination: Secondary | ICD-10-CM | POA: Diagnosis not present

## 2023-10-07 DIAGNOSIS — R2689 Other abnormalities of gait and mobility: Secondary | ICD-10-CM | POA: Diagnosis not present

## 2023-10-07 NOTE — Telephone Encounter (Signed)
 Patient refused Emergency Room at this time Patient wants Natasha Vasquez advice and wants her to be aware of these episodes This RN called the CAL and advised them of the situation Patient is also advised to call back with any changes and if anything gets worse to call 911 Patient verbalized understanding of that.    FYI Only or Action Required?: FYI only for provider.  Patient was last seen in primary care on 05/14/2023 by Natasha Jon HERO, MD.  Called Nurse Triage reporting Neurologic Problem.  Symptoms began several months ago.  Interventions attempted: Nothing.  Symptoms are: gradually worsening.  Triage Disposition: Go to ED Now (or PCP Triage)  Patient/caregiver understands and will follow disposition?: No, wishes to speak with PCP          Copied from CRM #[id number]. Topic: Clinical - Red Word Triage >> Oct 07, 2023  3:46 PM Natasha Vasquez wrote: Red Word that prompted transfer to Nurse Triage: Patient had stroke in February, for the last two months patient has been having spells where if she leans over and gets up too fast, there is a funny feeling in her head and goes all the way down her body and goes down to her toes. When the spells happen, she has to use the bathroom when the sensation goes through her midsection, and patient usually does urinate. When the spells are over, the pateitn feels washed out. Patient is tired and its a very styrange feeling. Afraid it might be a seizure. Reason for Disposition . Patient sounds very sick or weak to the triager  Answer Assessment - Initial Assessment Questions Patient states she feels fine at the moment during triage and the last episode was a few days ago Patient had a stroke in Feb- She has mentioned this to both of her therapists (OT or PT) & been told possible seizures, overactive vagus nerve, or hypothalamus, etc.  She is not sure exactly what may be causing these episodes. Patient still weak on her right side from the  waist down. From the waist up she states she is doing much better. She states she is still dragging her foot some. Patient states that she gets what feels like a hot flash inside that doesn't last a very long time but then she states it goes down her body into her toes. She states afterwards she feels very tired & she wont feel good She states that when this feeling gets to the middle of her body--she will feel like she has to urinate and a lot of times she will urinate She states that she had 4-5 episodes in one day She states that these episodes make it where she cannot breathe She states that the episodes only last a few minutes. Patient states that her last episode was a few days ago and she states she kept having them over and over. Patient is very worried and not sure what is triggering these episodes. Patient is advised that the ER is recommended at this time She states that she wants her PCP Natasha Vasquez to be aware of these episodes She states that her husband has the beginning stages of dementia and she is trying to take care of him. Patient is advised that if anything worsens to call 911 Patient verbalized understanding.   1. SYMPTOM: What is the main symptom you are concerned about? (e.g., weakness, numbness)     Episodes that she is unsure exactly what they are 2. ONSET: When did this start? (e.g.,  minutes, hours, days; while sleeping)     She states a few months ago 3. LAST NORMAL: When was the last time you (the patient) were normal (no symptoms)?     Patient aware and at her baseline at this time per her words 4. PATTERN Does this come and go, or has it been constant since it started?  Is it present now?     Comes and goes 5. CARDIAC SYMPTOMS: Have you had any of the following symptoms: chest pain, difficulty breathing, palpitations?     ----- 6. NEUROLOGIC SYMPTOMS: Have you had any of the following symptoms: headache, dizziness, vision loss, double vision,  changes in speech, unsteady on your feet?     Episodes of feeling like a hot flash, having to urinate, urinating on herself, and then feeling worn out afterwards---lasting a few minutes  Protocols used: Neurologic Deficit-A-AH

## 2023-10-08 DIAGNOSIS — M25551 Pain in right hip: Secondary | ICD-10-CM | POA: Diagnosis not present

## 2023-10-08 NOTE — Telephone Encounter (Signed)
 Please get her an appt sometime this week with any provider with openings in the office. Does not need immediate ED visit

## 2023-10-10 ENCOUNTER — Encounter: Payer: Self-pay | Admitting: Family Medicine

## 2023-10-10 ENCOUNTER — Ambulatory Visit: Admitting: Family Medicine

## 2023-10-10 VITALS — BP 154/68 | HR 71 | Ht 65.0 in | Wt 186.3 lb

## 2023-10-10 DIAGNOSIS — R2689 Other abnormalities of gait and mobility: Secondary | ICD-10-CM | POA: Diagnosis not present

## 2023-10-10 DIAGNOSIS — I951 Orthostatic hypotension: Secondary | ICD-10-CM | POA: Diagnosis not present

## 2023-10-10 DIAGNOSIS — I10 Essential (primary) hypertension: Secondary | ICD-10-CM

## 2023-10-10 DIAGNOSIS — E1142 Type 2 diabetes mellitus with diabetic polyneuropathy: Secondary | ICD-10-CM

## 2023-10-10 DIAGNOSIS — I6930 Unspecified sequelae of cerebral infarction: Secondary | ICD-10-CM

## 2023-10-10 DIAGNOSIS — Z7985 Long-term (current) use of injectable non-insulin antidiabetic drugs: Secondary | ICD-10-CM

## 2023-10-10 DIAGNOSIS — I63132 Cerebral infarction due to embolism of left carotid artery: Secondary | ICD-10-CM | POA: Diagnosis not present

## 2023-10-10 DIAGNOSIS — H532 Diplopia: Secondary | ICD-10-CM

## 2023-10-10 DIAGNOSIS — R278 Other lack of coordination: Secondary | ICD-10-CM | POA: Diagnosis not present

## 2023-10-10 DIAGNOSIS — M25551 Pain in right hip: Secondary | ICD-10-CM | POA: Diagnosis not present

## 2023-10-10 DIAGNOSIS — Z23 Encounter for immunization: Secondary | ICD-10-CM

## 2023-10-10 DIAGNOSIS — R55 Syncope and collapse: Secondary | ICD-10-CM

## 2023-10-10 DIAGNOSIS — I6522 Occlusion and stenosis of left carotid artery: Secondary | ICD-10-CM | POA: Diagnosis not present

## 2023-10-10 DIAGNOSIS — R2681 Unsteadiness on feet: Secondary | ICD-10-CM | POA: Diagnosis not present

## 2023-10-10 DIAGNOSIS — R4189 Other symptoms and signs involving cognitive functions and awareness: Secondary | ICD-10-CM | POA: Diagnosis not present

## 2023-10-10 DIAGNOSIS — M6281 Muscle weakness (generalized): Secondary | ICD-10-CM | POA: Diagnosis not present

## 2023-10-10 DIAGNOSIS — Z741 Need for assistance with personal care: Secondary | ICD-10-CM | POA: Diagnosis not present

## 2023-10-10 MED ORDER — LISINOPRIL 5 MG PO TABS: 5.0000 mg | ORAL_TABLET | Freq: Every day | ORAL | 1 refills | Status: AC

## 2023-10-10 NOTE — Progress Notes (Signed)
 Acute visit   Patient: Natasha Vasquez   DOB: 1940/12/23   83 y.o. Female  MRN: [id number] PCP: Myrla Jon HERO, MD   Chief Complaint  Patient presents with   Acute Visit    Patient is present due to a few unusual episodes where she feels like she can't breath, then she will tingle and it will start at her head all the way to her knees or below, feels as if she has to urinate while it is happening. Reports it kind of reminds her of the feeling you have when you get up to fast. Associated with hearing crickets that gets real loud. She reports episodes are intermittent but worse time was last week having 4-5 times in 1 days. Usually happens 1-2xs a day at least 2 times a wk.    Subjective    Discussed the use of AI scribe software for clinical note transcription with the patient, who gave verbal consent to proceed.  History of Present Illness   Natasha Vasquez is an 83 year old female with a history of CVA and carotid stenosis who presents with episodes of feeling unable to breathe, tingling, and an urge to urinate.  She experiences episodes of dyspnea, tingling spreading down her body, and an urge to urinate. These episodes feel like a 'whoosh', similar to orthostatic hypotension but more intense, lasting less than a minute. They are debilitating during occurrence, followed by relief. There is no syncope, but she feels near syncope with a persistent hum in her head that worsens during episodes. Post-episode fatigue leaves her feeling exhausted.  Episodes often occur post-activity, such as after emptying the dishwasher or changing positions. Leaning over and standing up can trigger episodes. Blood pressure and oxygen  levels remain normal during therapy sessions.  Her medical history includes CVA and carotid stenosis with stenting. She is on Plavix  and has stopped aspirin . She also has diabetes, hyperlipidemia, diastolic heart failure, and hypertension. Her medications  include carvedilol , but not metoprolol , which was previously prescribed.  She is undergoing occupational and physical therapy for balance and walking issues, with noted improvement in fine motor skills and handwriting. Right-sided weakness has improved to about 85-90% of pre-stroke function.        Review of Systems  Objective    BP (!) 154/68 (BP Location: Left Arm, Patient Position: Sitting, Cuff Size: Large)   Pulse 71   Ht 5' 5 (1.651 m)   Wt 186 lb 4.8 oz (84.5 kg)   SpO2 97%   BMI 31.00 kg/m   Orthostatic Vitals for the past 48 hrs (Last 6 readings):  Patient Position BP Pulse BP Location Cuff Size Patient Position (if appropriate) BP- Standing at 0 minutes Pulse- Standing at 0 minutes BP- Sitting Pulse- Sitting BP- Lying Pulse- Lying  10/10/23 1048 Sitting (!) 154/68 71 Left Arm Large -- -- -- -- -- -- --  10/10/23 1135 -- -- -- -- -- Orthostatic Vitals 111/72 76 114/80 67 135/79 68    Physical Exam Vitals reviewed.  Constitutional:      General: She is not in acute distress.    Appearance: Normal appearance. She is well-developed. She is not diaphoretic.  HENT:     Head: Normocephalic and atraumatic.  Eyes:     General: No scleral icterus.    Conjunctiva/sclera: Conjunctivae normal.  Neck:     Thyroid : No thyromegaly.  Cardiovascular:     Rate and Rhythm: Normal rate and regular rhythm.  Heart sounds: Normal heart sounds. No murmur heard. Pulmonary:     Effort: Pulmonary effort is normal. No respiratory distress.     Breath sounds: Normal breath sounds. No wheezing, rhonchi or rales.  Musculoskeletal:     Cervical back: Neck supple.     Right lower leg: No edema.     Left lower leg: No edema.  Lymphadenopathy:     Cervical: No cervical adenopathy.  Skin:    General: Skin is warm and dry.     Findings: No rash.  Neurological:     Mental Status: She is alert and oriented to person, place, and time. Mental status is at baseline.     Cranial Nerves: No  cranial nerve deficit.     Sensory: No sensory deficit.     Motor: No weakness.     Coordination: Coordination normal.     Gait: Gait abnormal.  Psychiatric:        Mood and Affect: Mood normal.        Behavior: Behavior normal.       No results found for any visits on 10/10/23.  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 Likely related to overactivity of the vagus nerve due to brainstem stroke. Differential includes seizures, but awareness during episodes and lack of postictal confusion make seizures unlikely. Episodes are becoming more frequent but are tolerable. - Check orthostatic blood pressures to rule out orthostatic hypotension as a contributor. - Refer to neurology for further evaluation, attempt to expedite appointment with Dr. Rosemarie.  Orthostatic hypotension Blood pressure drops significantly by more than 20 points from lying to sitting, which may contribute to symptoms. Not the sole cause of episodes but a potential contributor. - Reduce lisinopril  from 10 mg to 5 mg to address orthostatic hypotension. - Monitor blood pressure changes with medication adjustment.  Brainstem stroke with residual right-sided weakness Residual weakness primarily on the right side, with fine motor difficulties and balance issues. Stroke located in the brainstem, affecting autonomic functions. - Continue occupational  and physical therapy to improve motor function and balance. - Follow up with neurology for ongoing management.  Diplopia (double vision) Double vision noted, possibly related to stroke.  Tinnitus Intermittent tinnitus described as a chirping sound, possibly related to stroke. - Monitor symptoms and discuss with neurology if she persists or worsens.  Type 2 diabetes mellitus Diabetes management complicated by intolerance to Mounjaro  due to diarrhea. Current A1c status unknown. - Discontinue Mounjaro  due to severe diarrhea. - Recheck A1c at next appointment in November to assess diabetes control.  Diarrhea due to Mounjaro  (tirzepatide ) Severe diarrhea associated with Mounjaro  use, leading to discontinuation of the medication. - Discontinue Mounjaro .  Hypertension Blood pressure management complicated by orthostatic hypotension. Current medication regimen  includes lisinopril , which is being adjusted. - Reduce lisinopril  from 10 mg to 5 mg to address orthostatic hypotension.  Diastolic heart failure  Carotid artery stenosis, status post stenting Status post carotid stenting with no acute issues discussed. On Plavix  for antiplatelet therapy. - Continue Plavix  as prescribed.  Bilateral knee osteoarthritis Chronic knee pain with interest in potential gel injections for symptom relief. - Consider gel injections for knee osteoarthritis if symptoms persist.       Meds ordered this encounter  Medications   lisinopril  (ZESTRIL ) 5 MG tablet    Sig: Take 1 tablet (5 mg total) by mouth daily.    Dispense:  90 tablet    Refill:  1     Return in about 2 months (around 12/10/2023) for as scheduled.      I personally spent a total of 48 minutes in the care of the patient today including preparing to see the patient, getting/reviewing separately obtained history, performing a medically appropriate exam/evaluation, counseling and educating, placing orders, documenting clinical information in  the EHR, and coordinating care.   Jon Eva, MD  [facility] [phone] (phone) [phone] (fax)  [facility]

## 2023-10-11 DIAGNOSIS — M25551 Pain in right hip: Secondary | ICD-10-CM | POA: Diagnosis not present

## 2023-10-11 DIAGNOSIS — M6281 Muscle weakness (generalized): Secondary | ICD-10-CM | POA: Diagnosis not present

## 2023-10-11 DIAGNOSIS — I63132 Cerebral infarction due to embolism of left carotid artery: Secondary | ICD-10-CM | POA: Diagnosis not present

## 2023-10-11 DIAGNOSIS — Z741 Need for assistance with personal care: Secondary | ICD-10-CM | POA: Diagnosis not present

## 2023-10-11 DIAGNOSIS — R2689 Other abnormalities of gait and mobility: Secondary | ICD-10-CM | POA: Diagnosis not present

## 2023-10-11 DIAGNOSIS — R2681 Unsteadiness on feet: Secondary | ICD-10-CM | POA: Diagnosis not present

## 2023-10-11 DIAGNOSIS — R4189 Other symptoms and signs involving cognitive functions and awareness: Secondary | ICD-10-CM | POA: Diagnosis not present

## 2023-10-11 DIAGNOSIS — I6522 Occlusion and stenosis of left carotid artery: Secondary | ICD-10-CM | POA: Diagnosis not present

## 2023-10-11 DIAGNOSIS — R278 Other lack of coordination: Secondary | ICD-10-CM | POA: Diagnosis not present

## 2023-10-14 DIAGNOSIS — M25551 Pain in right hip: Secondary | ICD-10-CM | POA: Diagnosis not present

## 2023-10-14 DIAGNOSIS — Z741 Need for assistance with personal care: Secondary | ICD-10-CM | POA: Diagnosis not present

## 2023-10-14 DIAGNOSIS — R2681 Unsteadiness on feet: Secondary | ICD-10-CM | POA: Diagnosis not present

## 2023-10-14 DIAGNOSIS — I6522 Occlusion and stenosis of left carotid artery: Secondary | ICD-10-CM | POA: Diagnosis not present

## 2023-10-14 DIAGNOSIS — R4189 Other symptoms and signs involving cognitive functions and awareness: Secondary | ICD-10-CM | POA: Diagnosis not present

## 2023-10-14 DIAGNOSIS — M6281 Muscle weakness (generalized): Secondary | ICD-10-CM | POA: Diagnosis not present

## 2023-10-14 DIAGNOSIS — R2689 Other abnormalities of gait and mobility: Secondary | ICD-10-CM | POA: Diagnosis not present

## 2023-10-14 DIAGNOSIS — R278 Other lack of coordination: Secondary | ICD-10-CM | POA: Diagnosis not present

## 2023-10-14 DIAGNOSIS — I63132 Cerebral infarction due to embolism of left carotid artery: Secondary | ICD-10-CM | POA: Diagnosis not present

## 2023-10-15 ENCOUNTER — Telehealth: Payer: Self-pay | Admitting: Family Medicine

## 2023-10-15 DIAGNOSIS — I10 Essential (primary) hypertension: Secondary | ICD-10-CM

## 2023-10-15 NOTE — Telephone Encounter (Signed)
 [facility] faxed refill request for the following medications:  lisinopril  10mg  tablet    Please advise.

## 2023-10-16 DIAGNOSIS — M6281 Muscle weakness (generalized): Secondary | ICD-10-CM | POA: Diagnosis not present

## 2023-10-16 DIAGNOSIS — I63132 Cerebral infarction due to embolism of left carotid artery: Secondary | ICD-10-CM | POA: Diagnosis not present

## 2023-10-16 DIAGNOSIS — R4189 Other symptoms and signs involving cognitive functions and awareness: Secondary | ICD-10-CM | POA: Diagnosis not present

## 2023-10-16 DIAGNOSIS — Z741 Need for assistance with personal care: Secondary | ICD-10-CM | POA: Diagnosis not present

## 2023-10-16 DIAGNOSIS — M25551 Pain in right hip: Secondary | ICD-10-CM | POA: Diagnosis not present

## 2023-10-16 DIAGNOSIS — R2681 Unsteadiness on feet: Secondary | ICD-10-CM | POA: Diagnosis not present

## 2023-10-16 DIAGNOSIS — I6522 Occlusion and stenosis of left carotid artery: Secondary | ICD-10-CM | POA: Diagnosis not present

## 2023-10-16 DIAGNOSIS — R278 Other lack of coordination: Secondary | ICD-10-CM | POA: Diagnosis not present

## 2023-10-16 DIAGNOSIS — R2689 Other abnormalities of gait and mobility: Secondary | ICD-10-CM | POA: Diagnosis not present

## 2023-10-16 NOTE — Telephone Encounter (Signed)
 lisinopril  (ZESTRIL ) 5 MG tablet 90 tablet 1 10/16/2023 --   Request refused: Patient has requested refill too soon (LRF 10/10/23 #90 1rf)   Sig - Route: Take 1 tablet (5 mg total) by mouth daily. - Oral

## 2023-10-17 DIAGNOSIS — I63132 Cerebral infarction due to embolism of left carotid artery: Secondary | ICD-10-CM | POA: Diagnosis not present

## 2023-10-17 DIAGNOSIS — M25551 Pain in right hip: Secondary | ICD-10-CM | POA: Diagnosis not present

## 2023-10-17 DIAGNOSIS — M6281 Muscle weakness (generalized): Secondary | ICD-10-CM | POA: Diagnosis not present

## 2023-10-17 DIAGNOSIS — I6522 Occlusion and stenosis of left carotid artery: Secondary | ICD-10-CM | POA: Diagnosis not present

## 2023-10-17 DIAGNOSIS — Z741 Need for assistance with personal care: Secondary | ICD-10-CM | POA: Diagnosis not present

## 2023-10-17 DIAGNOSIS — R4189 Other symptoms and signs involving cognitive functions and awareness: Secondary | ICD-10-CM | POA: Diagnosis not present

## 2023-10-17 DIAGNOSIS — R2689 Other abnormalities of gait and mobility: Secondary | ICD-10-CM | POA: Diagnosis not present

## 2023-10-17 DIAGNOSIS — R278 Other lack of coordination: Secondary | ICD-10-CM | POA: Diagnosis not present

## 2023-10-19 ENCOUNTER — Emergency Department
Admission: EM | Admit: 2023-10-19 | Discharge: 2023-10-19 | Disposition: A | Discharge status: Home / Self Care | Attending: Emergency Medicine | Admitting: Emergency Medicine

## 2023-10-19 ENCOUNTER — Other Ambulatory Visit: Payer: Self-pay

## 2023-10-19 ENCOUNTER — Emergency Department

## 2023-10-19 DIAGNOSIS — G9389 Other specified disorders of brain: Secondary | ICD-10-CM | POA: Diagnosis not present

## 2023-10-19 DIAGNOSIS — E119 Type 2 diabetes mellitus without complications: Secondary | ICD-10-CM | POA: Insufficient documentation

## 2023-10-19 DIAGNOSIS — W01198A Fall on same level from slipping, tripping and stumbling with subsequent striking against other object, initial encounter: Secondary | ICD-10-CM | POA: Diagnosis not present

## 2023-10-19 DIAGNOSIS — I5032 Chronic diastolic (congestive) heart failure: Secondary | ICD-10-CM | POA: Diagnosis not present

## 2023-10-19 DIAGNOSIS — S0990XA Unspecified injury of head, initial encounter: Secondary | ICD-10-CM | POA: Diagnosis not present

## 2023-10-19 DIAGNOSIS — S7002XA Contusion of left hip, initial encounter: Secondary | ICD-10-CM | POA: Insufficient documentation

## 2023-10-19 DIAGNOSIS — Z96641 Presence of right artificial hip joint: Secondary | ICD-10-CM | POA: Insufficient documentation

## 2023-10-19 DIAGNOSIS — E039 Hypothyroidism, unspecified: Secondary | ICD-10-CM | POA: Diagnosis not present

## 2023-10-19 DIAGNOSIS — Y92002 Bathroom of unspecified non-institutional (private) residence single-family (private) house as the place of occurrence of the external cause: Secondary | ICD-10-CM | POA: Insufficient documentation

## 2023-10-19 DIAGNOSIS — I110 Hypertensive heart disease with heart failure: Secondary | ICD-10-CM | POA: Insufficient documentation

## 2023-10-19 DIAGNOSIS — W19XXXA Unspecified fall, initial encounter: Secondary | ICD-10-CM

## 2023-10-19 DIAGNOSIS — M4801 Spinal stenosis, occipito-atlanto-axial region: Secondary | ICD-10-CM | POA: Diagnosis not present

## 2023-10-19 DIAGNOSIS — Z8673 Personal history of transient ischemic attack (TIA), and cerebral infarction without residual deficits: Secondary | ICD-10-CM | POA: Insufficient documentation

## 2023-10-19 DIAGNOSIS — M47812 Spondylosis without myelopathy or radiculopathy, cervical region: Secondary | ICD-10-CM | POA: Diagnosis not present

## 2023-10-19 DIAGNOSIS — I6782 Cerebral ischemia: Secondary | ICD-10-CM | POA: Diagnosis not present

## 2023-10-19 DIAGNOSIS — G4733 Obstructive sleep apnea (adult) (pediatric): Secondary | ICD-10-CM | POA: Diagnosis not present

## 2023-10-19 DIAGNOSIS — Z043 Encounter for examination and observation following other accident: Secondary | ICD-10-CM | POA: Diagnosis not present

## 2023-10-19 DIAGNOSIS — M47816 Spondylosis without myelopathy or radiculopathy, lumbar region: Secondary | ICD-10-CM | POA: Diagnosis not present

## 2023-10-19 DIAGNOSIS — M4802 Spinal stenosis, cervical region: Secondary | ICD-10-CM | POA: Diagnosis not present

## 2023-10-19 MED ORDER — LIDOCAINE 5 % EX PTCH: 1.0000 | MEDICATED_PATCH | Freq: Two times a day (BID) | CUTANEOUS | 0 refills | Status: AC

## 2023-10-19 NOTE — ED Provider Notes (Signed)
 [facility] Provider Note    Event Date/Time   First MD Initiated Contact with Patient 10/19/23 1245     (approximate)   History   Fall   HPI  Natasha Vasquez is a 83 y.o. female who presents today for evaluation after a fall.  Patient reports that she got up at 4 AM to use the bathroom and missed the grab bar and fell onto her right hip.  She reports that she also hit her head.  She reports that she did not lose consciousness.  She was able to get herself up and walk to her bed and went back to sleep.  She reports that when she woke up at 8 AM she had pain in her hip prompting her to come in for evaluation.  She reports that she has had a history of a partial hip replacement in that area.  Patient Active Problem List   Diagnosis Date Noted   OSA on CPAP 05/14/2023   Arterial stenosis 04/08/2023   Stenosis of left carotid artery 04/08/2023   History of cerebrovascular accident (CVA) with residual deficit 03/15/2023   ICAO (internal carotid artery occlusion), left 02/26/2023   Brainstem infarct, acute (HCC) 02/26/2023   Right sided weakness 02/25/2023   History of CVA (cerebrovascular accident) without residual deficits 02/25/2023   Moderate episode of recurrent major depressive disorder (HCC) 02/11/2023   Sciatic pain, left 09/27/2021   SOB (shortness of breath) 05/16/2021   Aneurysm of ascending aorta without rupture 05/10/2021   HLD (hyperlipidemia) 05/04/2021   Elevated troponin 05/04/2021   Chronic diastolic CHF (congestive heart failure) (HCC) 05/04/2021   Recurrent major depressive disorder, in partial remission 03/24/2021   Gout 03/24/2021   PAD (peripheral artery disease) 06/24/2020   Iron  deficiency 10/07/2018   Primary osteoarthritis of left knee 09/17/2018   Emphysema lung (HCC) 03/14/2018   Diabetic peripheral neuropathy associated with type 2 diabetes mellitus (HCC) 03/14/2018   Metatarsalgia of both feet 03/14/2018   Abnormality of  lung 03/14/2018   Acquired absence of right breast 06/11/2017   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   Dysphagia 03/11/2017   Obesity 03/11/2017   Subclinical hypothyroidism 06/08/2015   Osteopenia 06/08/2015   Billowing mitral valve 06/08/2015   Melanoma in situ (HCC) 06/08/2015   Cramps of lower extremity 06/08/2015   Type 2 diabetes mellitus (HCC) 05/18/2015   Chronic diarrhea 05/18/2015   Anemia 05/18/2015   Clinical depression 01/26/2015   Hyperlipidemia associated with type 2 diabetes mellitus (HCC) 10/28/2014   Hypertension associated with diabetes (HCC) 09/27/2014   GERD (gastroesophageal reflux disease) 08/06/2014   Overactive bladder 07/16/2014          Physical Exam   Triage Vital Signs: ED Triage Vitals  Encounter Vitals Group     BP 10/19/23 1234 113/72     Girls Systolic BP Percentile --      Girls Diastolic BP Percentile --      Boys Systolic BP Percentile --      Boys Diastolic BP Percentile --      Pulse Rate 10/19/23 1234 70     Resp --      Temp 10/19/23 1234 98 F (36.7 C)     Temp Source 10/19/23 1234 Oral     SpO2 10/19/23 1234 95 %     Weight 10/19/23 1234 185 lb (83.9 kg)  Height 10/19/23 1234 5' 5 (1.651 m)     Head Circumference --      Peak Flow --      Pain Score 10/19/23 1239 0     Pain Loc --      Pain Education --      Exclude from Growth Chart --     Most recent vital signs: Vitals:   10/19/23 1234  BP: 113/72  Pulse: 70  Temp: 98 F (36.7 C)  SpO2: 95%    Physical Exam Vitals and nursing note reviewed.  Constitutional:      General: Awake and alert. No acute distress.    Appearance: Normal appearance. The patient is normal weight.  HENT:     Head: Normocephalic and atraumatic.     Mouth: Mucous membranes are moist.  Eyes:     General: PERRL. Normal EOMs        Right eye: No discharge.        Left eye: No  discharge.     Conjunctiva/sclera: Conjunctivae normal.  Cardiovascular:     Rate and Rhythm: Normal rate and regular rhythm.     Pulses: Normal pulses.  Pulmonary:     Effort: Pulmonary effort is normal. No respiratory distress.     Breath sounds: Normal breath sounds.  Abdominal:     Abdomen is soft. There is no abdominal tenderness. No rebound or guarding. No distention. Musculoskeletal:        General: No swelling. Normal range of motion.     Cervical back: Normal range of motion and neck supple.  No cervical spine tenderness, full and normal range of motion of bilateral upper extremities, normal strength in bilateral upper extremities, normal grip strength bilaterally. Right hip: Mild tenderness to palpation over the right greater trochanter.  She is able to flex bilateral hips against resistance.  She is able to internally and externally rotate bilateral hips against resistance.  Negative logroll of the right hip.  Able to stand.  Normal distal pulses. Skin:    General: Skin is warm and dry.     Capillary Refill: Capillary refill takes less than 2 seconds.     Findings: No rash.  Neurological:     Mental Status: The patient is awake and alert.   Neurological: GCS 15 alert and oriented x3 Normal speech, no expressive or receptive aphasia or dysarthria Cranial nerves II through XII intact Normal visual fields 5 out of 5 strength in all 4 extremities with intact sensation throughout No extremity drift Normal finger-to-nose testing, no limb or truncal ataxia    ED Results / Procedures / Treatments   Labs (all labs ordered are listed, but only abnormal results are displayed) Labs Reviewed - No data to display   EKG     RADIOLOGY I independently reviewed and interpreted imaging and agree with radiologists findings.     PROCEDURES:  Critical Care performed:   Procedures   MEDICATIONS ORDERED IN ED: Medications - No data to display   IMPRESSION / MDM /  ASSESSMENT AND PLAN / ED COURSE  I reviewed the triage vital signs and the nursing notes.   Differential diagnosis includes, but is not limited to, hip fracture, contusion, hematoma, dislocation.  Patient is awake and alert, hemodynamically stable and afebrile.  She is nontoxic in appearance.  CT head and neck obtained per [place] criteria and are negative for any acute findings.  X-ray of her hip obtained is negative for any acute osseous injury.  Patient is  quite reassured by these findings.  She is able to ambulate with her walker which she uses at home.  She feels comfortable going home.  She declined any analgesia in the emergency department, though agreed to lidocaine  patches to use at home.  We discussed strict return precautions and the importance of close outpatient follow-up.  Advised follow-up with her orthopedic surgeon, Dr. Kathlynn.  She understands return precautions in the meantime.  She was discharged in stable condition with her husband.   Patient's presentation is most consistent with acute complicated illness / injury requiring diagnostic workup.   FINAL CLINICAL IMPRESSION(S) / ED DIAGNOSES   Final diagnoses:  Fall, initial encounter  Injury of head, initial encounter  Contusion of left hip, initial encounter     Rx / DC Orders   ED Discharge Orders          Ordered    lidocaine  (LIDODERM ) 5 %  Every 12 hours        10/19/23 1457             Note:  This document was prepared using Dragon voice recognition software and may include unintentional dictation errors.   Shalah Estelle E, PA-C 10/19/23 BERYL Arlander [place], MD 10/20/23 [phone]

## 2023-10-19 NOTE — ED Triage Notes (Signed)
 Pt to ED for c/o fall. States she fell on her right hip in the bathroom. Pt able to stand up on her own. Pt states it didn't hurt that bad and went to bed, slept until 8. States pain was worse this morning. Pt states hx of partial hip replacement on right.

## 2023-10-19 NOTE — ED Notes (Signed)
Patient assisted to bathroom via wheelchair.

## 2023-10-19 NOTE — Discharge Instructions (Signed)
 Your CT scans and x-rays were normal.  Please follow-up with orthopedics if your symptoms persist.  Please return for any new, worsening, or changing symptoms or other concerns.  It was a pleasure caring for you today.

## 2023-10-21 DIAGNOSIS — R278 Other lack of coordination: Secondary | ICD-10-CM | POA: Diagnosis not present

## 2023-10-22 ENCOUNTER — Ambulatory Visit
Admission: RE | Admit: 2023-10-22 | Discharge: 2023-10-22 | Disposition: A | Discharge status: Home / Self Care | Source: Ambulatory Visit | Attending: Family Medicine | Admitting: Family Medicine

## 2023-10-22 DIAGNOSIS — M6281 Muscle weakness (generalized): Secondary | ICD-10-CM | POA: Diagnosis not present

## 2023-10-22 DIAGNOSIS — I6522 Occlusion and stenosis of left carotid artery: Secondary | ICD-10-CM | POA: Diagnosis not present

## 2023-10-22 DIAGNOSIS — I63132 Cerebral infarction due to embolism of left carotid artery: Secondary | ICD-10-CM | POA: Diagnosis not present

## 2023-10-22 DIAGNOSIS — R4189 Other symptoms and signs involving cognitive functions and awareness: Secondary | ICD-10-CM | POA: Diagnosis not present

## 2023-10-22 DIAGNOSIS — M25551 Pain in right hip: Secondary | ICD-10-CM | POA: Diagnosis not present

## 2023-10-22 DIAGNOSIS — Z1231 Encounter for screening mammogram for malignant neoplasm of breast: Secondary | ICD-10-CM | POA: Insufficient documentation

## 2023-10-22 DIAGNOSIS — R2689 Other abnormalities of gait and mobility: Secondary | ICD-10-CM | POA: Diagnosis not present

## 2023-10-22 DIAGNOSIS — R2681 Unsteadiness on feet: Secondary | ICD-10-CM | POA: Diagnosis not present

## 2023-10-22 DIAGNOSIS — R278 Other lack of coordination: Secondary | ICD-10-CM | POA: Diagnosis not present

## 2023-10-22 DIAGNOSIS — Z741 Need for assistance with personal care: Secondary | ICD-10-CM | POA: Diagnosis not present

## 2023-10-23 ENCOUNTER — Ambulatory Visit (HOSPITAL_COMMUNITY)
Admission: RE | Admit: 2023-10-23 | Discharge: 2023-10-23 | Disposition: A | Discharge status: Home / Self Care | Source: Ambulatory Visit | Attending: Thoracic Surgery (Cardiothoracic Vascular Surgery) | Admitting: Thoracic Surgery (Cardiothoracic Vascular Surgery)

## 2023-10-23 DIAGNOSIS — I6522 Occlusion and stenosis of left carotid artery: Secondary | ICD-10-CM | POA: Diagnosis not present

## 2023-10-23 DIAGNOSIS — Z741 Need for assistance with personal care: Secondary | ICD-10-CM | POA: Diagnosis not present

## 2023-10-23 DIAGNOSIS — R4189 Other symptoms and signs involving cognitive functions and awareness: Secondary | ICD-10-CM | POA: Diagnosis not present

## 2023-10-23 DIAGNOSIS — M6281 Muscle weakness (generalized): Secondary | ICD-10-CM | POA: Diagnosis not present

## 2023-10-23 DIAGNOSIS — R2681 Unsteadiness on feet: Secondary | ICD-10-CM | POA: Diagnosis not present

## 2023-10-23 DIAGNOSIS — I63132 Cerebral infarction due to embolism of left carotid artery: Secondary | ICD-10-CM | POA: Diagnosis not present

## 2023-10-23 DIAGNOSIS — I700 Atherosclerosis of aorta: Secondary | ICD-10-CM | POA: Diagnosis not present

## 2023-10-23 DIAGNOSIS — R2689 Other abnormalities of gait and mobility: Secondary | ICD-10-CM | POA: Diagnosis not present

## 2023-10-23 DIAGNOSIS — I7121 Aneurysm of the ascending aorta, without rupture: Secondary | ICD-10-CM | POA: Diagnosis not present

## 2023-10-23 DIAGNOSIS — R278 Other lack of coordination: Secondary | ICD-10-CM | POA: Diagnosis not present

## 2023-10-23 DIAGNOSIS — M25551 Pain in right hip: Secondary | ICD-10-CM | POA: Diagnosis not present

## 2023-10-23 LAB — POCT I-STAT CREATININE: Creatinine, Ser: 1.1 mg/dL — ABNORMAL HIGH (ref 0.44–1.00)

## 2023-10-23 MED ORDER — IOHEXOL 350 MG/ML SOLN
75.0000 mL | Freq: Once | INTRAVENOUS | Status: AC | PRN
  Administered 2023-10-23: 75 mL via INTRAVENOUS

## 2023-10-25 ENCOUNTER — Ambulatory Visit (HOSPITAL_COMMUNITY)

## 2023-10-25 DIAGNOSIS — M1A0790 Idiopathic chronic gout, unspecified ankle and foot, without tophus (tophi): Secondary | ICD-10-CM | POA: Diagnosis not present

## 2023-10-25 DIAGNOSIS — M19042 Primary osteoarthritis, left hand: Secondary | ICD-10-CM | POA: Diagnosis not present

## 2023-10-25 DIAGNOSIS — R7689 Other specified abnormal immunological findings in serum: Secondary | ICD-10-CM | POA: Diagnosis not present

## 2023-10-25 DIAGNOSIS — M19041 Primary osteoarthritis, right hand: Secondary | ICD-10-CM | POA: Diagnosis not present

## 2023-10-28 ENCOUNTER — Ambulatory Visit: Payer: Self-pay | Admitting: Family Medicine

## 2023-10-28 NOTE — Progress Notes (Unsigned)
 Cardiology Clinic Note   Date: 10/29/2023 ID: Natasha Vasquez, DOB 18-Nov-1940, MRN [id number]  Primary Cardiologist:  Evalene Lunger, MD  Chief Complaint   Natasha Vasquez is a 83 y.o. female who presents to the clinic today for routine follow up.   Patient Profile   Natasha Vasquez is followed by Dr. Gollan for the history outlined below.      Past medical history significant for: Dyspnea. Coronary CTA 06/01/2021: Calcium  score of 0, no evidence of CAD. Echo 02/26/2023: EF 55 to 60%.  No RWMA.  Mild asymmetric LVH of the septal segment.  Grade I DD.  Normal RV size/function.  Normal PA pressure, RVSP 30.9 mmHg.  Aortic valve sclerosis without stenosis.  Bubble study negative for interatrial shunt. PAD. Carotid artery stenosis. CTA head and neck 02/25/2023: Calcified plaque at the left carotid bifurcation and ICA bulb. Minimal diameter in the ICA bulb less than 1 mm. Near occlusion. Vessel regains a more normal caliber at the distal bulb and is patent through the cervical region to the skull base beyond that. This is of particular importance in this patient as the left internal carotid artery supplies the majority of the brain, as the right ICA essentially gives isolated supply to the right middle cerebral artery territory only. Urgent vascular surgery consultation suggested. Stent placement left ICA 04/08/2023. Aneurysm of ascending thoracic aorta. CTA chest aorta 10/23/2023: Stable fusiform aneurysm of the ascending thoracic aorta measuring up to 4.7 cm.  No significant change since 2024.  Recommend semiannual imaging. Hypertension. Hyperlipidemia. Lipid panel 05/14/2023: LDL 59, HDL 38. TG 246, total 141.  Emphysema. OSA. CPAP 100% adherence.  GERD. T2DM. Subclinical hypothyroidism. Right breast cancer. S/p mastectomy. CVA. MR brain 02/25/2023: Acute infarction within the left midbrain and left cerebral hemispheric white matter. Mild chronic small vessel ischemic change of  the cerebral hemispheric white matter.   In summary, patient was first evaluated by Dr. Gollan on 05/29/2021 for shortness of breath at the request of Dr. Tamea.  Echo in March 2023 showed EF 60 to 65%, moderate LVH, Grade I DD, normal RV size/function, no significant valvular abnormalities, mild dilatation of aortic root 39 mm.  She underwent coronary CTA in May 2023 to further evaluate shortness of breath and this showed a calcium  score of 0 with no evidence of CAD.   Patient was last seen in the office by me on 01/29/2023 for routine follow up. She reported continued dyspnea and voiced frustration over all testing coming back normal and still having symptoms. She reported dyspnea getting so bad she had to take a break between emptying top and bottom of the dishwasher. She reported mild improvement after starting CPAP. She reported typically high BP in the 150s/90. BP well controlled at the time of her visit. Toprol  was stopped and she was started on carvedilol .   Patient presented to the ED on 02/25/2023 for weakness and shortness of breath. She reported she felt like she had a stroke the night before. She complained of right sided weakness and feeling off balance.  CTA head and neck demonstrated near occlusion of left ICA as detailed above.  MRI brain showed acute infarction within the left midbrain and left cerebral hemispheric white matter as detailed above.  Patient was discharged on 03/01/2023 to follow-up with neurology and neurointerventional radiology as an outpatient.  She underwent stent placement to left ICA on 04/08/2023.   Patient was last seen in the office by me on 04/29/2023 for routine follow-up.  She had been doing well since hospital discharge with some residual right-sided weakness.  She was working with PT with good tolerance.  She had no new cardiac complaints or concerns.  She reported occasional high BP readings at home but more consistently in the normal range.  BP at the time of her  visit was 106/80.     History of Present Illness    Today, patient is here alone. She is doing well from a cardiac standpoint. Patient denies shortness of breath, dyspnea on exertion, lower extremity edema, orthopnea or PND. No chest pain, pressure, or tightness. No palpitations. She continues to work with PT/OT for right-sided weakness after stroke. She was getting ready to graduate from rollator to cane when suffered a mechanical fall in the bathroom in the early morning hours landing on her hip. She denies lightheadedness, dizziness, palpitations, chest pain or dyspnea prior to fall. She was evaluated in the ED and was found to not have a significant injury. She does continue to have right hip pain and is having difficulty weight bearing. She is working with PT to get back to where she was previously. She feels that her upper body on the right side is about 95% recovered but her right leg is about 80%. She reports some increased stress as her husband has dementia and was just told he cannot drive anymore and since she has not returned to driving either they are reliant on friends to get them to appointments and other places.     ROS: All other systems reviewed and are otherwise negative except as noted in History of Present Illness.  EKGs/Labs Reviewed    EKG Interpretation Date/Time:  Tuesday October 29 2023 14:11:37 EDT Ventricular Rate:  78 PR Interval:  192 QRS Duration:  130 QT Interval:  410 QTC Calculation: 467 R Axis:   -62  Text Interpretation: Normal sinus rhythm Left axis deviation Left ventricular hypertrophy with QRS widening and repolarization abnormality ( R in aVL , Cornell product ) When compared with ECG of 25-Feb-2023 11:07, Premature ventricular complexes are no longer Present Confirmed by Loistine Sober [phone]) on 10/29/2023 2:18:00 PM   02/25/2023: ALT 40; AST 34 05/10/2023: BUN 12; Potassium 4.2; Sodium 142 10/23/2023: Creatinine, Ser 1.10   04/09/2023:  Hemoglobin 10.3; WBC 8.8   02/25/2023: TSH 3.325    Physical Exam    VS:  BP 117/76   Pulse 78   Ht 5' 5 (1.651 m)   Wt 186 lb (84.4 kg)   SpO2 90%   BMI 30.95 kg/m  , BMI Body mass index is 30.95 kg/m.  GEN: Well nourished, well developed, in no acute distress. Neck: No JVD or carotid bruits. Cardiac:  RRR.  No murmur. No rubs or gallops.   Respiratory:  Respirations regular and unlabored. Clear to auscultation without rales, wheezing or rhonchi. GI: Soft, nontender, nondistended. Extremities: Radials/DP/PT 2+ and equal bilaterally. No clubbing or cyanosis. No edema  Skin: Warm and dry, no rash. Neuro: Strength slightly weaker on the right.  Assessment & Plan   Dyspnea Echo February 2025 showed normal LV/RV function, mild LVH, Grade I DD, no significant valvular abnormalities.  Coronary CTA May 2023 showed calcium  score of 0 with no CAD. Patient reports dyspnea has improved. She is tolerating working with PT well.  -No further testing indicated at this time.  -Continue lisinopril , carvedilol , Lasix .   Aneurysm of ascending thoracic aorta CTA chest aorta 10/23/2023 demonstrated stable fusiform aneurysm of the ascending thoracic aorta measuring  up to 4.7 cm with no significant change since 2024.  Patient denies chest pain, back pain, abdominal pain, lightheadedness, dizziness, presyncope or syncope.  -Continue to follow with CT surgery.  -Continue routine surveillance.   Carotid artery disease Patient with recent stroke February 2025 and was found to have near occlusion of left ICA.  She underwent stent placement on 04/08/2023. No presyncope or syncope. -Continue aspirin , Plavix , rosuvastatin . -Continue to follow with vascular surgery.   History of stroke/right sided weakness Patient with recent stroke February 2025.  S/p stent placement left ICA March 2025. She reports residual right sided weakness. She feels her upper body has recovered about 95% but lower extremity has only  recovered about 80%. She is working with PT/OT with good tolerance. -Continue aspirin , Plavix , rosuvastatin . -Continue to follow with neurology.   Hypertension BP today 117/76. Lisinopril  was recently reduced to 5 mg daily. She feels BP has improved since then.  -Continue lisinopril , carvedilol .  Disposition: Return in 1 year or sooner as needed.          Signed, Barnie HERO. Cattie Tineo, DNP, NP-C

## 2023-10-29 ENCOUNTER — Ambulatory Visit: Attending: Student | Admitting: Student

## 2023-10-29 ENCOUNTER — Encounter: Payer: Self-pay | Admitting: Student

## 2023-10-29 VITALS — BP 117/76 | HR 78 | Ht 65.0 in | Wt 186.0 lb

## 2023-10-29 DIAGNOSIS — I6930 Unspecified sequelae of cerebral infarction: Secondary | ICD-10-CM | POA: Diagnosis not present

## 2023-10-29 DIAGNOSIS — R0602 Shortness of breath: Secondary | ICD-10-CM | POA: Diagnosis not present

## 2023-10-29 DIAGNOSIS — I6522 Occlusion and stenosis of left carotid artery: Secondary | ICD-10-CM

## 2023-10-29 DIAGNOSIS — M6281 Muscle weakness (generalized): Secondary | ICD-10-CM | POA: Diagnosis not present

## 2023-10-29 DIAGNOSIS — Z741 Need for assistance with personal care: Secondary | ICD-10-CM | POA: Diagnosis not present

## 2023-10-29 DIAGNOSIS — R531 Weakness: Secondary | ICD-10-CM | POA: Diagnosis not present

## 2023-10-29 DIAGNOSIS — R4189 Other symptoms and signs involving cognitive functions and awareness: Secondary | ICD-10-CM | POA: Diagnosis not present

## 2023-10-29 DIAGNOSIS — I10 Essential (primary) hypertension: Secondary | ICD-10-CM | POA: Diagnosis not present

## 2023-10-29 DIAGNOSIS — R2689 Other abnormalities of gait and mobility: Secondary | ICD-10-CM | POA: Diagnosis not present

## 2023-10-29 DIAGNOSIS — M25551 Pain in right hip: Secondary | ICD-10-CM | POA: Diagnosis not present

## 2023-10-29 DIAGNOSIS — I7121 Aneurysm of the ascending aorta, without rupture: Secondary | ICD-10-CM

## 2023-10-29 DIAGNOSIS — R278 Other lack of coordination: Secondary | ICD-10-CM | POA: Diagnosis not present

## 2023-10-29 DIAGNOSIS — R2681 Unsteadiness on feet: Secondary | ICD-10-CM | POA: Diagnosis not present

## 2023-10-29 DIAGNOSIS — I63132 Cerebral infarction due to embolism of left carotid artery: Secondary | ICD-10-CM | POA: Diagnosis not present

## 2023-10-29 NOTE — Patient Instructions (Signed)
 Medication Instructions:   Your physician recommends that you continue on your current medications as directed. Please refer to the Current Medication list given to you today.    *If you need a refill on your cardiac medications before your next appointment, please call your pharmacy*  Lab Work:  None ordered at this time   If you have labs (blood work) drawn today and your tests are completely normal, you will receive your results only by:  MyChart Message (if you have MyChart) OR  A paper copy in the mail If you have any lab test that is abnormal or we need to change your treatment, we will call you to review the results.  Testing/Procedures:  None ordered at this time   Referrals:  None ordered at this time   Follow-Up:  At [facility], you and your health needs are our priority.  As part of our continuing mission to provide you with exceptional heart care, our providers are all part of one team.  This team includes your primary Cardiologist (physician) and Advanced Practice Providers or APPs (Physician Assistants and Nurse Practitioners) who all work together to provide you with the care you need, when you need it.  Your next appointment:   1 year(s)  Provider:    Evalene Lunger, MD or Barnie Hila, NP    We recommend signing up for the patient portal called MyChart.  Sign up information is provided on this After Visit Summary.  MyChart is used to connect with patients for Virtual Visits (Telemedicine).  Patients are able to view lab/test results, encounter notes, upcoming appointments, etc.  Non-urgent messages can be sent to your provider as well.   To learn more about what you can do with MyChart, go to [email or web address].

## 2023-10-30 DIAGNOSIS — M25551 Pain in right hip: Secondary | ICD-10-CM | POA: Diagnosis not present

## 2023-10-31 DIAGNOSIS — I63132 Cerebral infarction due to embolism of left carotid artery: Secondary | ICD-10-CM | POA: Diagnosis not present

## 2023-10-31 DIAGNOSIS — M25551 Pain in right hip: Secondary | ICD-10-CM | POA: Diagnosis not present

## 2023-10-31 DIAGNOSIS — R278 Other lack of coordination: Secondary | ICD-10-CM | POA: Diagnosis not present

## 2023-10-31 DIAGNOSIS — Z741 Need for assistance with personal care: Secondary | ICD-10-CM | POA: Diagnosis not present

## 2023-10-31 DIAGNOSIS — M6281 Muscle weakness (generalized): Secondary | ICD-10-CM | POA: Diagnosis not present

## 2023-10-31 DIAGNOSIS — R2681 Unsteadiness on feet: Secondary | ICD-10-CM | POA: Diagnosis not present

## 2023-10-31 DIAGNOSIS — I6522 Occlusion and stenosis of left carotid artery: Secondary | ICD-10-CM | POA: Diagnosis not present

## 2023-10-31 DIAGNOSIS — R4189 Other symptoms and signs involving cognitive functions and awareness: Secondary | ICD-10-CM | POA: Diagnosis not present

## 2023-10-31 DIAGNOSIS — R2689 Other abnormalities of gait and mobility: Secondary | ICD-10-CM | POA: Diagnosis not present

## 2023-11-04 DIAGNOSIS — I6522 Occlusion and stenosis of left carotid artery: Secondary | ICD-10-CM | POA: Diagnosis not present

## 2023-11-04 DIAGNOSIS — M6281 Muscle weakness (generalized): Secondary | ICD-10-CM | POA: Diagnosis not present

## 2023-11-04 DIAGNOSIS — Z741 Need for assistance with personal care: Secondary | ICD-10-CM | POA: Diagnosis not present

## 2023-11-04 DIAGNOSIS — R278 Other lack of coordination: Secondary | ICD-10-CM | POA: Diagnosis not present

## 2023-11-04 DIAGNOSIS — R4189 Other symptoms and signs involving cognitive functions and awareness: Secondary | ICD-10-CM | POA: Diagnosis not present

## 2023-11-04 DIAGNOSIS — M25551 Pain in right hip: Secondary | ICD-10-CM | POA: Diagnosis not present

## 2023-11-04 DIAGNOSIS — I63132 Cerebral infarction due to embolism of left carotid artery: Secondary | ICD-10-CM | POA: Diagnosis not present

## 2023-11-04 DIAGNOSIS — R2681 Unsteadiness on feet: Secondary | ICD-10-CM | POA: Diagnosis not present

## 2023-11-04 DIAGNOSIS — R2689 Other abnormalities of gait and mobility: Secondary | ICD-10-CM | POA: Diagnosis not present

## 2023-11-04 NOTE — Progress Notes (Unsigned)
 [address] [place] [address]             [phone]            Natasha Vasquez [id number] 08-25-1940   History of Present Illness:  Natasha Vasquez is a 83 year old female with medical history of hypertension, peripheral artery disease, chronic diastolic CHF, internal carotid artery occlusion, stroke, emphysema, OSA, GERD, type 2 diabetes, and hyperlipidemia who present for continued follow up ascending thoracic aortic aneurysm.  She has been followed by our clinic since 2019. Aneurysm has measured 4.6 cm - 4.9 cm.  On recent CTA of chest aneurysm measured 4.7 cm.  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   She presents to today to the clinic with her husband.  She reports that she has been doing okay.  She had a stroke in February 2025 and has slowly been recovering from this.  She also reports that she fell 2 weeks ago and was evaluated in the emergency department.  She has been having a hard time walking since because of the bruising.  Her blood pressure is well-controlled with current medication therapy.  She denies chest pain, shortness of breath and lower leg swelling.   Current Outpatient Medications on File Prior to Visit  Medication Sig Dispense Refill   acetaminophen  (TYLENOL ) 500 MG tablet Take 500 mg by mouth every 6 (six) hours as needed for moderate pain (pain score 4-6).     albuterol  (VENTOLIN  HFA) 108 (90 Base) MCG/ACT inhaler TAKE 2 PUFFS BY MOUTH EVERY 6 HOURS AS NEEDED FOR WHEEZE OR SHORTNESS OF BREATH 8.5 each 2   allopurinol  (ZYLOPRIM ) 100 MG tablet Take 2 tablets (200 mg total) by mouth daily. 180 tablet 2   ARIPiprazole  (ABILIFY ) 2 MG tablet TAKE 1 TABLET BY MOUTH EVERY DAY 90 tablet 1   bismuth  subsalicylate (PEPTO BISMOL) 262 MG chewable tablet Chew 262 mg by mouth as needed for indigestion or diarrhea or loose stools. **capsule**     calcium  carbonate (TUMS - DOSED IN MG ELEMENTAL CALCIUM ) 500 MG chewable  tablet Chew 1 tablet by mouth as needed for indigestion or heartburn.     carboxymethylcellulose (REFRESH PLUS) 0.5 % SOLN Place 1 drop into both eyes 3 (three) times daily as needed (Dry Eyes).     carvedilol  (COREG ) 6.25 MG tablet Take 1 tablet (6.25 mg total) by mouth 2 (two) times daily. 180 tablet 3   Cholecalciferol (VITAMIN D -3 PO) Take 1 tablet by mouth daily.     clopidogrel  (PLAVIX ) 75 MG tablet Take 1 tablet (75 mg total) by mouth daily. 90 tablet 1   colestipol  (COLESTID ) 1 g tablet TAKE 1 TABLET BY MOUTH 2 TIMES DAILY. 180 tablet 5   famotidine  (PEPCID ) 20 MG tablet TAKE 1 TABLET BY MOUTH TWICE A DAY 180 tablet 3   ferrous sulfate  325 (65 FE) MG tablet Take 325 mg by mouth daily with breakfast.     fluocinonide (LIDEX) 0.05 % external solution Apply 1 Application topically daily as needed (scalp).     furosemide  (LASIX ) 20 MG tablet TAKE 1 TABLET BY MOUTH EVERY DAY 90 tablet 3   lisinopril  (ZESTRIL ) 5 MG tablet Take 1 tablet (5 mg total) by mouth daily. 90 tablet 1   Multiple Vitamins-Minerals (CENTRUM SILVER PO) Take 1 tablet by mouth daily.     oxybutynin  (DITROPAN -XL) 10 MG 24 hr tablet TAKE 1 TABLET BY  MOUTH EVERY DAY 90 tablet 3   pantoprazole  (PROTONIX ) 40 MG tablet TAKE 1 TABLET (40 MG TOTAL) BY MOUTH TWICE A DAY BEFORE MEALS 180 tablet 1   potassium chloride  (KLOR-CON ) 10 MEQ tablet TAKE 1 TABLET BY MOUTH EVERY DAY 90 tablet 3   pregabalin  (LYRICA ) 25 MG capsule TAKE 1 CAPSULE BY MOUTH TWICE A DAY 60 capsule 1   rosuvastatin  (CRESTOR ) 20 MG tablet Take 1 tablet (20 mg total) by mouth daily. 90 tablet 1   Venlafaxine  HCl 225 MG TB24 Take 1 tablet (225 mg total) by mouth daily at 2 PM. 90 tablet 1   aspirin  EC 81 MG tablet Take 81 mg by mouth daily. Swallow whole. (Patient not taking: Reported on 11/05/2023)     No current facility-administered medications on file prior to visit.     ROS: Review of Systems  Respiratory: Negative.  Negative for cough and shortness of  breath.   Cardiovascular: Negative.  Negative for chest pain and leg swelling.  Musculoskeletal:  Positive for myalgias.       Due to fall     BP 103/69   Pulse 76   Resp 20   Ht 5' 5 (1.651 m)   Wt 185 lb (83.9 kg)   SpO2 93% Comment: RA  BMI 30.79 kg/m   Physical Exam Constitutional:      Appearance: Normal appearance.  HENT:     Head: Normocephalic and atraumatic.  Cardiovascular:     Rate and Rhythm: Normal rate and regular rhythm.     Heart sounds: Normal heart sounds, S1 normal and S2 normal.  Pulmonary:     Effort: Pulmonary effort is normal.     Breath sounds: Normal breath sounds.  Musculoskeletal:     Cervical back: Normal range of motion.  Skin:    General: Skin is warm and dry.  Neurological:     General: No focal deficit present.     Mental Status: She is alert.      Imaging: CLINICAL DATA:  Aneurysm of the ascending aorta without rupture.   EXAM: CT ANGIOGRAPHY CHEST WITH CONTRAST   TECHNIQUE: Multidetector CT imaging of the chest was performed using the ECG gated protocol during bolus administration of intravenous contrast. Multiplanar CT image reconstructions and MIPs were obtained to evaluate the vascular anatomy.   RADIATION DOSE REDUCTION: This exam was performed according to the departmental dose-optimization program which includes automated exposure control, adjustment of the mA and/or kV according to patient size and/or use of iterative reconstruction technique.   CONTRAST:  75mL OMNIPAQUE  IOHEXOL  350 MG/ML SOLN   COMPARISON:  CT chest 04/23/2023 and chest CTA 08/13/2022 chest CT 08/16/2020   FINDINGS: Cardiovascular: Aortic root at the sinuses that measures 3.5 cm on image 107, sequence 602. Difficult to accurately measure the sinotubular junction. Ascending thoracic aorta measures up to 4.7 cm on image 69, sequence 605 and stable compared to 08/13/2022. No evidence for an aortic dissection. Typical three-vessel arch anatomy.  Arch great vessels are patent. Flow in the left and right coronary arteries. Acute angulation near the origin of the right subclavian artery. Proximal vertebral arteries are patent. Proximal descending thoracic aorta measures 3.2 cm and stable. Mild atherosclerotic disease in the descending thoracic aorta. Main celiac trunk is patent. Heart size normal. No significant pericardial effusion. Main pulmonary arteries are patent.   Mediastinum/Nodes: Thyroid  tissue is unremarkable. Pretracheal lymph node on image 51/605 is minimally changed measuring 9 mm in the short axis. Small lymph nodes  bilaterally. No significant change in the small left axillary lymph nodes. Right breast implant.   Lungs/Pleura: Trachea and mainstem bronchi are patent. Stable branching densities in the right middle lobe could represent small focus of mucoid impaction. Stable small calcified granuloma in the posterior right lower lobe on image 70, sequence 302. No significant airspace disease or consolidation in the lungs. Chronic volume loss at the lingula. Stable punctate subpleural nodule in the left upper lobe on image 57, sequence 302.   Upper Abdomen: Again noted is dilatation of the extrahepatic bile duct but incompletely evaluated. No acute abnormality in the visualized upper abdomen.   Musculoskeletal: No acute bone abnormality.   Review of the MIP images confirms the above findings.   IMPRESSION: 1. Stable fusiform aneurysm of the ascending thoracic aorta measuring up to 4.7 cm. No significant change since 2024. Ascending thoracic aortic aneurysm. Recommend semi-annual imaging followup by CTA or MRA. This recommendation follows 2010 ACCF/AHA/AATS/ACR/ASA/SCA/SCAI/SIR/STS/SVM Guidelines for the Diagnosis and Management of Patients With Thoracic Aortic Disease. Circulation. 2010; 121: Z733-z630. Aortic aneurysm NOS (ICD10-I71.9) 2. No acute chest abnormality. 3. Stable small pulmonary nodules and  densities. 4.  Aortic Atherosclerosis (ICD10-I70.0).     Electronically Signed   By: Juliene Balder M.D.   On: 10/23/2023 15:23     A/P: Aneurysm of ascending aorta without rupture -4.7 cm ascending thoracic aortic aneurysm on CTA of chest.  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  We discussed the natural history and and risk factors for growth of ascending aortic aneurysms. Discussed recommendations to minimize the risk of further expansion or dissection including careful blood pressure control, avoidance of contact sports and heavy lifting, attention to lipid management.  We covered the importance of continued smoking cessation.  The patient does not yet meet surgical criteria of >5.5cm. The patient is aware of signs and symptoms of aortic dissection and when to present to the emergency department   -Follow up in 6 months with CTA of chest for continued surveillance   Risk Modification:  Statin:  rosuvastatin   Smoking cessation instruction/counseling given:  commended patient for quitting and reviewed strategies for preventing relapses  Patient was counseled on importance of Blood Pressure Control  They are instructed to contact their Primary Care Physician if they start to have blood pressure readings over 130s/90s. Do not ever stop blood pressure medications on your own, unless instructed by healthcare professional.  Please avoid use of Fluoroquinolones as this can potentially increase your risk of Aortic Rupture and/or Dissection  Patient educated on signs and symptoms of Aortic Dissection, handout also provided in AVS  Manuelita CHRISTELLA Rough, PA-C 11/05/23

## 2023-11-05 ENCOUNTER — Ambulatory Visit

## 2023-11-05 VITALS — BP 103/69 | HR 76 | Resp 20 | Ht 65.0 in | Wt 185.0 lb

## 2023-11-05 DIAGNOSIS — I7121 Aneurysm of the ascending aorta, without rupture: Secondary | ICD-10-CM | POA: Diagnosis not present

## 2023-11-05 DIAGNOSIS — Z741 Need for assistance with personal care: Secondary | ICD-10-CM | POA: Diagnosis not present

## 2023-11-05 DIAGNOSIS — R2681 Unsteadiness on feet: Secondary | ICD-10-CM | POA: Diagnosis not present

## 2023-11-05 DIAGNOSIS — M25551 Pain in right hip: Secondary | ICD-10-CM | POA: Diagnosis not present

## 2023-11-05 DIAGNOSIS — R2689 Other abnormalities of gait and mobility: Secondary | ICD-10-CM | POA: Diagnosis not present

## 2023-11-05 DIAGNOSIS — I6522 Occlusion and stenosis of left carotid artery: Secondary | ICD-10-CM | POA: Diagnosis not present

## 2023-11-05 DIAGNOSIS — M6281 Muscle weakness (generalized): Secondary | ICD-10-CM | POA: Diagnosis not present

## 2023-11-05 DIAGNOSIS — R4189 Other symptoms and signs involving cognitive functions and awareness: Secondary | ICD-10-CM | POA: Diagnosis not present

## 2023-11-05 DIAGNOSIS — I63132 Cerebral infarction due to embolism of left carotid artery: Secondary | ICD-10-CM | POA: Diagnosis not present

## 2023-11-05 DIAGNOSIS — R278 Other lack of coordination: Secondary | ICD-10-CM | POA: Diagnosis not present

## 2023-11-05 NOTE — Patient Instructions (Signed)

## 2023-11-07 DIAGNOSIS — R2681 Unsteadiness on feet: Secondary | ICD-10-CM | POA: Diagnosis not present

## 2023-11-07 DIAGNOSIS — R2689 Other abnormalities of gait and mobility: Secondary | ICD-10-CM | POA: Diagnosis not present

## 2023-11-07 DIAGNOSIS — I63132 Cerebral infarction due to embolism of left carotid artery: Secondary | ICD-10-CM | POA: Diagnosis not present

## 2023-11-07 DIAGNOSIS — M6281 Muscle weakness (generalized): Secondary | ICD-10-CM | POA: Diagnosis not present

## 2023-11-07 DIAGNOSIS — R278 Other lack of coordination: Secondary | ICD-10-CM | POA: Diagnosis not present

## 2023-11-07 DIAGNOSIS — I6522 Occlusion and stenosis of left carotid artery: Secondary | ICD-10-CM | POA: Diagnosis not present

## 2023-11-07 DIAGNOSIS — M25551 Pain in right hip: Secondary | ICD-10-CM | POA: Diagnosis not present

## 2023-11-07 DIAGNOSIS — R4189 Other symptoms and signs involving cognitive functions and awareness: Secondary | ICD-10-CM | POA: Diagnosis not present

## 2023-11-07 DIAGNOSIS — Z741 Need for assistance with personal care: Secondary | ICD-10-CM | POA: Diagnosis not present

## 2023-11-09 ENCOUNTER — Other Ambulatory Visit: Payer: Self-pay | Admitting: Family Medicine

## 2023-11-11 NOTE — Telephone Encounter (Signed)
 Requested Prescriptions  Pending Prescriptions Disp Refills   Venlafaxine  HCl 225 MG TB24 [Pharmacy Med Name: VENLAFAXINE  HCL ER 225 MG TAB] 90 tablet 0    Sig: TAKE 1 TABLET (225 MG TOTAL) BY MOUTH DAILY AT 2 PM.     Psychiatry: Antidepressants - SNRI - desvenlafaxine & venlafaxine  Failed - 11/11/2023  5:30 PM      Failed - Cr in normal range and within 360 days    Creatinine, Ser  Date Value Ref Range Status  10/23/2023 1.10 (H) 0.44 - 1.00 mg/dL Final         Failed - Lipid Panel in normal range within the last 12 months    Cholesterol, Total  Date Value Ref Range Status  05/14/2023 141 100 - 199 mg/dL Final   LDL Chol Calc (NIH)  Date Value Ref Range Status  05/14/2023 59 0 - 99 mg/dL Final   Direct LDL  Date Value Ref Range Status  02/26/2023 81 0 - 99 mg/dL Final    Comment:    Performed at [facility], [address], [place], [place] [address]   HDL  Date Value Ref Range Status  05/14/2023 38 (L) >39 mg/dL Final   Triglycerides  Date Value Ref Range Status  05/14/2023 276 (H) 0 - 149 mg/dL Final         Passed - Completed PHQ-2 or PHQ-9 in the last 360 days      Passed - Last BP in normal range    BP Readings from Last 1 Encounters:  11/05/23 103/69         Passed - Valid encounter within last 6 months    Recent Outpatient Visits           1 month ago Orthostatic hypotension   [place] [facility] [place], Jon HERO, MD   6 months ago Encounter for annual physical exam   [facility] [place], Jon HERO, MD   7 months ago Pain of right hip   [facility] Leavy Mole, PA-C   8 months ago ICAO (internal carotid artery occlusion), left   [facility] [place], Jon HERO, MD

## 2023-11-12 DIAGNOSIS — Z741 Need for assistance with personal care: Secondary | ICD-10-CM | POA: Diagnosis not present

## 2023-11-12 DIAGNOSIS — R4189 Other symptoms and signs involving cognitive functions and awareness: Secondary | ICD-10-CM | POA: Diagnosis not present

## 2023-11-12 DIAGNOSIS — R2681 Unsteadiness on feet: Secondary | ICD-10-CM | POA: Diagnosis not present

## 2023-11-12 DIAGNOSIS — M25551 Pain in right hip: Secondary | ICD-10-CM | POA: Diagnosis not present

## 2023-11-12 DIAGNOSIS — M6281 Muscle weakness (generalized): Secondary | ICD-10-CM | POA: Diagnosis not present

## 2023-11-12 DIAGNOSIS — R278 Other lack of coordination: Secondary | ICD-10-CM | POA: Diagnosis not present

## 2023-11-12 DIAGNOSIS — I63132 Cerebral infarction due to embolism of left carotid artery: Secondary | ICD-10-CM | POA: Diagnosis not present

## 2023-11-12 DIAGNOSIS — R2689 Other abnormalities of gait and mobility: Secondary | ICD-10-CM | POA: Diagnosis not present

## 2023-11-13 DIAGNOSIS — I63132 Cerebral infarction due to embolism of left carotid artery: Secondary | ICD-10-CM | POA: Diagnosis not present

## 2023-11-13 DIAGNOSIS — R2689 Other abnormalities of gait and mobility: Secondary | ICD-10-CM | POA: Diagnosis not present

## 2023-11-13 DIAGNOSIS — R4189 Other symptoms and signs involving cognitive functions and awareness: Secondary | ICD-10-CM | POA: Diagnosis not present

## 2023-11-13 DIAGNOSIS — M6281 Muscle weakness (generalized): Secondary | ICD-10-CM | POA: Diagnosis not present

## 2023-11-13 DIAGNOSIS — Z741 Need for assistance with personal care: Secondary | ICD-10-CM | POA: Diagnosis not present

## 2023-11-13 DIAGNOSIS — R2681 Unsteadiness on feet: Secondary | ICD-10-CM | POA: Diagnosis not present

## 2023-11-13 DIAGNOSIS — M25551 Pain in right hip: Secondary | ICD-10-CM | POA: Diagnosis not present

## 2023-11-13 DIAGNOSIS — R278 Other lack of coordination: Secondary | ICD-10-CM | POA: Diagnosis not present

## 2023-11-14 ENCOUNTER — Ambulatory Visit: Admitting: Family Medicine

## 2023-11-17 ENCOUNTER — Other Ambulatory Visit: Payer: Self-pay | Admitting: Family Medicine

## 2023-11-18 DIAGNOSIS — Z85820 Personal history of malignant melanoma of skin: Secondary | ICD-10-CM | POA: Diagnosis not present

## 2023-11-18 DIAGNOSIS — D2272 Melanocytic nevi of left lower limb, including hip: Secondary | ICD-10-CM | POA: Diagnosis not present

## 2023-11-18 DIAGNOSIS — L538 Other specified erythematous conditions: Secondary | ICD-10-CM | POA: Diagnosis not present

## 2023-11-18 DIAGNOSIS — Z86006 Personal history of melanoma in-situ: Secondary | ICD-10-CM | POA: Diagnosis not present

## 2023-11-18 DIAGNOSIS — B078 Other viral warts: Secondary | ICD-10-CM | POA: Diagnosis not present

## 2023-11-18 DIAGNOSIS — D2262 Melanocytic nevi of left upper limb, including shoulder: Secondary | ICD-10-CM | POA: Diagnosis not present

## 2023-11-18 DIAGNOSIS — D2261 Melanocytic nevi of right upper limb, including shoulder: Secondary | ICD-10-CM | POA: Diagnosis not present

## 2023-11-18 DIAGNOSIS — D225 Melanocytic nevi of trunk: Secondary | ICD-10-CM | POA: Diagnosis not present

## 2023-11-18 DIAGNOSIS — Z85828 Personal history of other malignant neoplasm of skin: Secondary | ICD-10-CM | POA: Diagnosis not present

## 2023-11-19 DIAGNOSIS — G4733 Obstructive sleep apnea (adult) (pediatric): Secondary | ICD-10-CM | POA: Diagnosis not present

## 2023-11-19 NOTE — Telephone Encounter (Signed)
 Requested Prescriptions  Pending Prescriptions Disp Refills   colestipol  (COLESTID ) 1 g tablet [Pharmacy Med Name: COLESTIPOL  HCL 1 GM TABLET] 60 tablet 0    Sig: TAKE 1 TABLET BY MOUTH TWICE A DAY     Cardiovascular:  Antilipid - Bile Acid Sequestrants Failed - 11/19/2023 12:15 PM      Failed - Lipid Panel in normal range within the last 12 months    Cholesterol, Total  Date Value Ref Range Status  05/14/2023 141 100 - 199 mg/dL Final   LDL Chol Calc (NIH)  Date Value Ref Range Status  05/14/2023 59 0 - 99 mg/dL Final   Direct LDL  Date Value Ref Range Status  02/26/2023 81 0 - 99 mg/dL Final    Comment:    Performed at [facility], [address] [place], [place] [address]   HDL  Date Value Ref Range Status  05/14/2023 38 (L) >39 mg/dL Final   Triglycerides  Date Value Ref Range Status  05/14/2023 276 (H) 0 - 149 mg/dL Final         Passed - Valid encounter within last 12 months    Recent Outpatient Visits           1 month ago Orthostatic hypotension   [place] [facility] [place], Jon HERO, MD   6 months ago Encounter for annual physical exam   [facility] [place], Jon HERO, MD   7 months ago Pain of right hip   [facility] Leavy Mole, PA-C   8 months ago ICAO (internal carotid artery occlusion), left   [facility] [place], Jon HERO, MD

## 2023-11-21 ENCOUNTER — Encounter: Payer: Self-pay | Admitting: Family Medicine

## 2023-11-21 ENCOUNTER — Ambulatory Visit (INDEPENDENT_AMBULATORY_CARE_PROVIDER_SITE_OTHER): Admitting: Family Medicine

## 2023-11-21 VITALS — BP 119/69 | HR 65 | Ht 65.0 in | Wt 185.5 lb

## 2023-11-21 DIAGNOSIS — I152 Hypertension secondary to endocrine disorders: Secondary | ICD-10-CM | POA: Diagnosis not present

## 2023-11-21 DIAGNOSIS — F331 Major depressive disorder, recurrent, moderate: Secondary | ICD-10-CM

## 2023-11-21 DIAGNOSIS — E038 Other specified hypothyroidism: Secondary | ICD-10-CM | POA: Diagnosis not present

## 2023-11-21 DIAGNOSIS — E1142 Type 2 diabetes mellitus with diabetic polyneuropathy: Secondary | ICD-10-CM

## 2023-11-21 DIAGNOSIS — E1159 Type 2 diabetes mellitus with other circulatory complications: Secondary | ICD-10-CM | POA: Diagnosis not present

## 2023-11-21 DIAGNOSIS — Z79899 Other long term (current) drug therapy: Secondary | ICD-10-CM

## 2023-11-21 DIAGNOSIS — E1169 Type 2 diabetes mellitus with other specified complication: Secondary | ICD-10-CM | POA: Diagnosis not present

## 2023-11-21 DIAGNOSIS — I6930 Unspecified sequelae of cerebral infarction: Secondary | ICD-10-CM

## 2023-11-21 DIAGNOSIS — E785 Hyperlipidemia, unspecified: Secondary | ICD-10-CM

## 2023-11-21 MED ORDER — COLESTIPOL HCL 1 G PO TABS: 1.0000 g | ORAL_TABLET | Freq: Two times a day (BID) | ORAL | 3 refills | Status: AC

## 2023-11-21 NOTE — Assessment & Plan Note (Signed)
 Diabetes is well-controlled with no acute issues. - Ordered A1c test to monitor diabetes control.

## 2023-11-21 NOTE — Progress Notes (Signed)
 Established patient visit   Patient: Natasha Vasquez   DOB: May 01, 1940   83 y.o. Female  MRN: [id number] Visit Date: 11/21/2023  Today's healthcare provider: Jon Eva, MD   Chief Complaint  Patient presents with   Medical Management of Chronic Issues    Reports spells where she feels she is struggling to catch her breath, her heart is racing and she feels a tingling feeling all the way down to her knees. Reports spells on last about 30 seconds and her therapist states she may have overactive vagus nevere.    Hypertension   Hyperlipidemia   Subjective    Hypertension  Hyperlipidemia   HPI     Medical Management of Chronic Issues    Additional comments: Reports spells where she feels she is struggling to catch her breath, her heart is racing and she feels a tingling feeling all the way down to her knees. Reports spells on last about 30 seconds and her therapist states she may have overactive vagus nevere.       Last edited by Lilian Fitzpatrick, CMA on 11/21/2023 10:29 AM.       Discussed the use of AI scribe software for clinical note transcription with the patient, who gave verbal consent to proceed.  History of Present Illness   Natasha Vasquez is an 83 year old female with a history of stroke who presents for a follow-up visit. She is accompanied by her husband, [name].  Three weeks ago, she experienced a fall resulting in a deep bruise on her hip, significantly impacting her mobility. She was unable to walk with a cane and relied on others for transportation for about two weeks. She has since resumed driving and is regaining her independence.  She experiences episodes of tingling from her body to her knees, a racing heart, and a feeling of impending fainting, typically when sitting down. These episodes last seconds and are followed by an urgent need to use the bathroom. They often occur when fatigued after activities like cleaning or folding clothes.  She manages these episodes by sitting down and letting them pass. No syncope occurs during these spells, but fatigue follows.  Her current medications include lisinopril  5 mg daily, rosuvastatin , Lasix  20 mg daily, Plavix , Abilify  taken at night, and Effexor . Since taking Abilify  at night, she has had fewer bad dreams.  She feels sadness and frustration related to her husband's inability to drive and her own need to be the primary driver. She experiences grief over changes in her life and the loss of control following her stroke.         Medications: Outpatient Medications Prior to Visit  Medication Sig   acetaminophen  (TYLENOL ) 500 MG tablet Take 500 mg by mouth every 6 (six) hours as needed for moderate pain (pain score 4-6).   albuterol  (VENTOLIN  HFA) 108 (90 Base) MCG/ACT inhaler TAKE 2 PUFFS BY MOUTH EVERY 6 HOURS AS NEEDED FOR WHEEZE OR SHORTNESS OF BREATH   allopurinol  (ZYLOPRIM ) 100 MG tablet Take 2 tablets (200 mg total) by mouth daily.   ARIPiprazole  (ABILIFY ) 2 MG tablet TAKE 1 TABLET BY MOUTH EVERY DAY   bismuth  subsalicylate (PEPTO BISMOL) 262 MG chewable tablet Chew 262 mg by mouth as needed for indigestion or diarrhea or loose stools. **capsule**   calcium  carbonate (TUMS - DOSED IN MG ELEMENTAL CALCIUM ) 500 MG chewable tablet Chew 1 tablet by mouth as needed for indigestion or heartburn.   carvedilol  (COREG ) 6.25 MG tablet Take  1 tablet (6.25 mg total) by mouth 2 (two) times daily.   Cholecalciferol (VITAMIN D -3 PO) Take 1 tablet by mouth daily.   clopidogrel  (PLAVIX ) 75 MG tablet Take 1 tablet (75 mg total) by mouth daily.   famotidine  (PEPCID ) 20 MG tablet TAKE 1 TABLET BY MOUTH TWICE A DAY   ferrous sulfate  325 (65 FE) MG tablet Take 325 mg by mouth daily with breakfast.   fluocinonide (LIDEX) 0.05 % external solution Apply 1 Application topically daily as needed (scalp).   furosemide  (LASIX ) 20 MG tablet TAKE 1 TABLET BY MOUTH EVERY DAY   lisinopril  (ZESTRIL ) 5 MG tablet  Take 1 tablet (5 mg total) by mouth daily.   Multiple Vitamins-Minerals (CENTRUM SILVER PO) Take 1 tablet by mouth daily.   oxybutynin  (DITROPAN -XL) 10 MG 24 hr tablet TAKE 1 TABLET BY MOUTH EVERY DAY   pantoprazole  (PROTONIX ) 40 MG tablet TAKE 1 TABLET (40 MG TOTAL) BY MOUTH TWICE A DAY BEFORE MEALS   potassium chloride  (KLOR-CON ) 10 MEQ tablet TAKE 1 TABLET BY MOUTH EVERY DAY   pregabalin  (LYRICA ) 25 MG capsule TAKE 1 CAPSULE BY MOUTH TWICE A DAY   rosuvastatin  (CRESTOR ) 20 MG tablet Take 1 tablet (20 mg total) by mouth daily.   Venlafaxine  HCl 225 MG TB24 TAKE 1 TABLET (225 MG TOTAL) BY MOUTH DAILY AT 2 PM.   [DISCONTINUED] colestipol  (COLESTID ) 1 g tablet TAKE 1 TABLET BY MOUTH TWICE A DAY   No facility-administered medications prior to visit.    Review of Systems     Objective    BP 119/69 (BP Location: Left Arm, Patient Position: Sitting, Cuff Size: Normal)   Pulse 65   Ht 5' 5 (1.651 m)   Wt 185 lb 8 oz (84.1 kg)   SpO2 95%   BMI 30.87 kg/m    Physical Exam Vitals reviewed.  Constitutional:      General: She is not in acute distress.    Appearance: Normal appearance. She is well-developed. She is not diaphoretic.  HENT:     Head: Normocephalic and atraumatic.  Eyes:     General: No scleral icterus.    Conjunctiva/sclera: Conjunctivae normal.  Neck:     Thyroid : No thyromegaly.  Cardiovascular:     Rate and Rhythm: Normal rate and regular rhythm.     Heart sounds: Normal heart sounds. No murmur heard. Pulmonary:     Effort: Pulmonary effort is normal. No respiratory distress.     Breath sounds: Normal breath sounds. No wheezing, rhonchi or rales.  Musculoskeletal:     Cervical back: Neck supple.     Right lower leg: No edema.     Left lower leg: No edema.  Lymphadenopathy:     Cervical: No cervical adenopathy.  Skin:    General: Skin is warm and dry.     Findings: No rash.  Neurological:     Mental Status: She is alert and oriented to person, place, and  time. Mental status is at baseline.  Psychiatric:        Mood and Affect: Mood normal.        Behavior: Behavior normal.      No results found for any visits on 11/21/23.  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   Hypertension is well-controlled with current medication regimen. - Continue carvedilol  6.25 mg twice a day -  Continue furosemide  20 mg daily - Continue lisinopril  5 mg daily      Relevant Medications   colestipol  (COLESTID ) 1 g tablet   Other Relevant Orders   Comprehensive metabolic panel with GFR     Endocrine   Hyperlipidemia associated with type 2 diabetes mellitus (HCC)   Hyperlipidemia is managed with Crestor .  - Continue Crestor  20 mg daily      Relevant Medications   colestipol  (COLESTID ) 1 g tablet   Other Relevant Orders   Comprehensive metabolic panel with GFR   Lipid panel   Subclinical hypothyroidism   Continue to monitor TSH      Relevant Orders   TSH + free T4   Type 2 diabetes mellitus (HCC)   Diabetes is well-controlled with no acute issues. - Ordered A1c test to monitor diabetes control.      Relevant Orders   Hemoglobin A1c     Other   Moderate episode of recurrent major depressive disorder (HCC)   Mood is well-managed with Effexor  and Abilify . Reports fewer bad dreams and is in good spirits. - Continue current medications (Effexor  and Abilify ).       History of cerebrovascular accident (CVA) with residual deficit   Experiences vagal episodes characterized by tingling, heart racing, and near syncope, likely related to vagus nerve misfiring. Episodes are not dangerous but can be distressing. Risk factors for stroke are being managed with medications. - Ensure adequate hydration. - Advised to sit down if feeling an episode coming on to prevent falls. - Continue current medications to manage stroke risk factors.       Other Visit Diagnoses       Long-term use of high-risk medication       Relevant Orders   CBC w/Diff/Platelet         Return in about 6 months (around 05/20/2024) for CPE.       Jon Eva, MD  [facility] [phone] (phone) [phone] (fax)  [facility]

## 2023-11-21 NOTE — Assessment & Plan Note (Signed)
 Experiences vagal episodes characterized by tingling, heart racing, and near syncope, likely related to vagus nerve misfiring. Episodes are not dangerous but can be distressing. Risk factors for stroke are being managed with medications. - Ensure adequate hydration. - Advised to sit down if feeling an episode coming on to prevent falls. - Continue current medications to manage stroke risk factors.

## 2023-11-21 NOTE — Assessment & Plan Note (Signed)
 Hypertension is well-controlled with current medication regimen. - Continue carvedilol  6.25 mg twice a day - Continue furosemide  20 mg daily - Continue lisinopril  5 mg daily

## 2023-11-21 NOTE — Assessment & Plan Note (Signed)
 Hyperlipidemia is managed with Crestor .  - Continue Crestor  20 mg daily

## 2023-11-21 NOTE — Assessment & Plan Note (Signed)
Continue to monitor TSH

## 2023-11-21 NOTE — Assessment & Plan Note (Signed)
 Mood is well-managed with Effexor  and Abilify . Reports fewer bad dreams and is in good spirits. - Continue current medications (Effexor  and Abilify ).

## 2023-11-22 LAB — CBC WITH DIFFERENTIAL/PLATELET
Basophils Absolute: 0.1 x10E3/uL (ref 0.0–0.2)
Basos: 1 %
EOS (ABSOLUTE): 0.2 x10E3/uL (ref 0.0–0.4)
Eos: 3 %
Hematocrit: 36 % (ref 34.0–46.6)
Hemoglobin: 11.3 g/dL (ref 11.1–15.9)
Immature Grans (Abs): 0 x10E3/uL (ref 0.0–0.1)
Immature Granulocytes: 0 %
Lymphocytes Absolute: 1.4 x10E3/uL (ref 0.7–3.1)
Lymphs: 22 %
MCH: 28.4 pg (ref 26.6–33.0)
MCHC: 31.4 g/dL — ABNORMAL LOW (ref 31.5–35.7)
MCV: 91 fL (ref 79–97)
Monocytes Absolute: 0.7 x10E3/uL (ref 0.1–0.9)
Monocytes: 11 %
Neutrophils Absolute: 3.9 x10E3/uL (ref 1.4–7.0)
Neutrophils: 63 %
Platelets: 315 x10E3/uL (ref 150–450)
RBC: 3.98 x10E6/uL (ref 3.77–5.28)
RDW: 14.9 % (ref 11.7–15.4)
WBC: 6.2 x10E3/uL (ref 3.4–10.8)

## 2023-11-22 LAB — COMPREHENSIVE METABOLIC PANEL WITH GFR
ALT: 12 IU/L (ref 0–32)
AST: 16 IU/L (ref 0–40)
Albumin: 4.6 g/dL (ref 3.7–4.7)
Alkaline Phosphatase: 109 IU/L (ref 48–129)
BUN/Creatinine Ratio: 16 (ref 12–28)
BUN: 17 mg/dL (ref 8–27)
Bilirubin Total: 0.4 mg/dL (ref 0.0–1.2)
CO2: 21 mmol/L (ref 20–29)
Calcium: 10 mg/dL (ref 8.7–10.3)
Chloride: 105 mmol/L (ref 96–106)
Creatinine, Ser: 1.09 mg/dL — ABNORMAL HIGH (ref 0.57–1.00)
Globulin, Total: 2.1 g/dL (ref 1.5–4.5)
Glucose: 78 mg/dL (ref 70–99)
Potassium: 4.3 mmol/L (ref 3.5–5.2)
Sodium: 142 mmol/L (ref 134–144)
Total Protein: 6.7 g/dL (ref 6.0–8.5)
eGFR: 50 mL/min/1.73 — ABNORMAL LOW (ref >59)

## 2023-11-22 LAB — LIPID PANEL
Chol/HDL Ratio: 4.8 ratio — ABNORMAL HIGH (ref 0.0–4.4)
Cholesterol, Total: 152 mg/dL (ref 100–199)
HDL: 32 mg/dL — ABNORMAL LOW (ref >39)
LDL Chol Calc (NIH): 67 mg/dL (ref 0–99)
Triglycerides: 333 mg/dL — ABNORMAL HIGH (ref 0–149)
VLDL Cholesterol Cal: 53 mg/dL — ABNORMAL HIGH (ref 5–40)

## 2023-11-22 LAB — HEMOGLOBIN A1C
Est. average glucose Bld gHb Est-mCnc: 126 mg/dL
Hgb A1c MFr Bld: 6 % — ABNORMAL HIGH (ref 4.8–5.6)

## 2023-11-22 LAB — TSH+FREE T4
Free T4: 0.94 ng/dL (ref 0.82–1.77)
TSH: 2.26 u[IU]/mL (ref 0.450–4.500)

## 2023-11-26 ENCOUNTER — Ambulatory Visit: Payer: Self-pay | Admitting: Family Medicine

## 2023-12-04 ENCOUNTER — Other Ambulatory Visit: Payer: Self-pay | Admitting: Family Medicine

## 2023-12-04 DIAGNOSIS — M25551 Pain in right hip: Secondary | ICD-10-CM

## 2023-12-04 NOTE — Telephone Encounter (Signed)
 Copied from CRM #[id number]. Topic: Clinical - Medication Refill >> Dec 04, 2023  8:34 AM Gustabo D wrote: Medication: clopidogrel  (PLAVIX ) 75 MG tablet, pregabalin  (LYRICA ) 25 MG capsule  Has the patient contacted their pharmacy? Yes (Agent: If no, request that the patient contact the pharmacy for the refill. If patient does not wish to contact the pharmacy document the reason why and proceed with request.) (Agent: If yes, when and what did the pharmacy advise?)  This is the patient's preferred pharmacy:  [id number] [place] [facility] - [address] [place] [address] Phone: [phone] Fax: [phone]  Is this the correct pharmacy for this prescription? Yes If no, delete pharmacy and type the correct one.   Has the prescription been filled recently? No  Is the patient out of the medication? Yes  Has the patient been seen for an appointment in the last year OR does the patient have an upcoming appointment? Yes  Can we respond through MyChart? Yes  Agent: Please be advised that Rx refills may take up to 3 business days. We ask that you follow-up with your pharmacy.

## 2023-12-06 NOTE — Telephone Encounter (Signed)
 Requested medication (s) are due for refill today: yes  Requested medication (s) are on the active medication list: yes  Last refill:  09/12/23  Future visit scheduled: yes  Notes to clinic:  Unable to refill per protocol, last refill by another provider. cannot delegate Lyrica       Requested Prescriptions  Pending Prescriptions Disp Refills   clopidogrel  (PLAVIX ) 75 MG tablet 90 tablet 1    Sig: Take 1 tablet (75 mg total) by mouth daily.     Hematology: Antiplatelets - clopidogrel  Failed - 12/06/2023  2:05 PM      Failed - Cr in normal range and within 360 days    Creatinine, Ser  Date Value Ref Range Status  11/21/2023 1.09 (H) 0.57 - 1.00 mg/dL Final         Passed - HCT in normal range and within 180 days    Hematocrit  Date Value Ref Range Status  11/21/2023 36.0 34.0 - 46.6 % Final         Passed - HGB in normal range and within 180 days    Hemoglobin  Date Value Ref Range Status  11/21/2023 11.3 11.1 - 15.9 g/dL Final         Passed - PLT in normal range and within 180 days    Platelets  Date Value Ref Range Status  11/21/2023 315 150 - 450 x10E3/uL Final         Passed - Valid encounter within last 6 months    Recent Outpatient Visits           2 weeks ago Hypertension associated with diabetes [facility])   [place] [facility] [place], Jon HERO, MD   1 month ago Orthostatic hypotension   [place] [facility] [place], Jon HERO, MD   6 months ago Encounter for annual physical exam   [place] [facility] [place], Jon HERO, MD   8 months ago Pain of right hip   [facility] Mole, PA-C   8 months ago ICAO (internal carotid artery occlusion), left   [place] [facility] [place], Jon HERO, MD               pregabalin  (LYRICA ) 25 MG capsule 60 capsule 1    Sig: Take 1 capsule (25 mg total) by mouth 2 (two) times daily.     Not Delegated -  Neurology:  Anticonvulsants - Controlled - pregabalin  Failed - 12/06/2023  2:05 PM      Failed - This refill cannot be delegated      Failed - Cr in normal range and within 360 days    Creatinine, Ser  Date Value Ref Range Status  11/21/2023 1.09 (H) 0.57 - 1.00 mg/dL Final         Passed - Completed PHQ-2 or PHQ-9 in the last 360 days      Passed - Valid encounter within last 12 months    Recent Outpatient Visits           2 weeks ago Hypertension associated with diabetes [facility])   [place] [facility] [place], Jon HERO, MD   1 month ago Orthostatic hypotension   [place] [facility] [place], Jon HERO, MD   6 months ago Encounter for annual physical exam   [facility] [place], Jon HERO, MD   8 months ago Pain of right hip   [facility] Leavy,  Michelene, PA-C   8 months ago ICAO (internal carotid artery occlusion), left   [facility] [place], Jon HERO, MD

## 2023-12-09 MED ORDER — PREGABALIN 25 MG PO CAPS: 25.0000 mg | ORAL_CAPSULE | Freq: Two times a day (BID) | ORAL | 0 refills | Status: AC

## 2023-12-09 MED ORDER — CLOPIDOGREL BISULFATE 75 MG PO TABS: 75.0000 mg | ORAL_TABLET | Freq: Every day | ORAL | 1 refills | Status: AC

## 2023-12-10 NOTE — Progress Notes (Signed)
 Chief Complaint  Patient presents with   Follow-up    Rm1, husband present, Follow up for stroke,Carotid stenosis, asymptomatic, left S/P angioplasty with stent HYPERLIPIDEMIA.    HISTORY OF PRESENT ILLNESS:  12/16/23 ALL:  Natasha Vasquez is a 83 y.o. 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   Since, she continues to do well. She continues to work with PT/OT twice weekly. Right leg weakness is worse in the mornings. She is trying to stay active.   She continues rosuvastatin  and Plavix . LDL 67 11/2023. A1C 6. BP is usually normal. Typical reading 120s/70-80s. She has had episodes of orthostatic hypotension. She is followed closely by PCP.   She had a fall 6-8 weeks ago She landed on right hip. No broken bone, just bruises. She is walking with cane. Uses walker at night. She lives with her husband (who has dementia) at [facility]. She is able to drive without difficulty. She is managing medications independently.   She had left carotid stent. She has not had follow up imaging.   HISTORY (copied from Dr Bucky previous note)  HPI: Ms Heyward is a 83 year old pleasant Caucasian lady seen today for first office f/u visit accompanied by her husband after recent hospital consult for stroke in Feb 2025.  She has past medical history of hypertension, diabetes, hyperlipidemia and gastroesophageal reflux disease.  She presented on 02/25/2023 to [facility] emergency room with sudden onset of incoordination of the right side.  She got up at 3:30 in the morning and ambulated all right to the bathroom.  When she woke up again at 730 she was unable to walk and right side was not working right.  CT scan showed age-indeterminate left superior cerebellar infarct.  She was not a TNK candidate as she was outside the window.  NIH stroke scale was 3 due to ataxia and dysarthria.  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  CT angiogram showed near occlusive high-grade proximal left ICA stenosis which was asymptomatic.  Echocardiogram showed ejection fraction of 55 to 60%.  Left atrial size was normal.  LDL cholesterol 81 mg percent.  Hemoglobin A1c was 6.6.  Patient had a diagnostic cerebral catheter angiogram done on 02/27/2023 by Dr Dolphus which confirmed near occlusive severe proximal left ICA stenosis.  Patient was transferred to inpatient rehab and did well and subsequently underwent elective left ICA angioplasty and stenting on 04/04/2023.  She states she did not extremely well.  She is able to ambulate with a walker and does well and has had no falls or injuries.  She is not steady while using her cane.  Her speech has recovered well though she has occasional hesitancy when she is tired.  She remains on aspirin  and Plavix  which she is tolerating well but does bruise easily.  She has had no bleeding episodes.  She is tolerating increased dose of Crestor  20 mg well without any side effects.  Her blood pressure is under good control today it is 110/70.  She is eating healthy and has lost some weight.  She has no complaints today.     REVIEW OF SYSTEMS: Out of a complete 14 system review of symptoms, the patient complains only of the following symptoms, right lower ext weakness, and all other reviewed systems are negative.   ALLERGIES: Allergies  Allergen  Reactions   Codeine Other (See Comments)    Chest pain   Ciprofloxacin      Can not take    Quinolones     PATIENT UNAWARE OF allergy to this class of antibiotics, only aware of allergy to [facility] Patient was warned about not using Cipro  and similar antibiotics. Recent studies have raised concern that fluoroquinolone antibiotics could be associated with an increased risk of aortic aneurysm Fluoroquinolones have non-antimicrobial properties that might jeopardise the integrity of the extracellular matrix of the  vascular wall In a  propensity score matched cohort study in [place], there was a 66% increased rate   Aleve [Naproxen Sodium] Hives, Rash and Other (See Comments)    headaches   Lorazepam Other (See Comments)    Feels loopy   Oxycodone  Other (See Comments)    MENTAL CHANGES   Paregoric Other (See Comments)    Chest pain---tolerated MORPHINE    Penicillins Hives    Can take Augmentin     HOME MEDICATIONS: Outpatient Medications Prior to Visit  Medication Sig Dispense Refill   acetaminophen  (TYLENOL ) 500 MG tablet Take 500 mg by mouth every 6 (six) hours as needed for moderate pain (pain score 4-6).     albuterol  (VENTOLIN  HFA) 108 (90 Base) MCG/ACT inhaler TAKE 2 PUFFS BY MOUTH EVERY 6 HOURS AS NEEDED FOR WHEEZE OR SHORTNESS OF BREATH 8.5 each 2   allopurinol  (ZYLOPRIM ) 100 MG tablet Take 2 tablets (200 mg total) by mouth daily. 180 tablet 2   ARIPiprazole  (ABILIFY ) 2 MG tablet TAKE 1 TABLET BY MOUTH EVERY DAY 90 tablet 1   bismuth  subsalicylate (PEPTO BISMOL) 262 MG chewable tablet Chew 262 mg by mouth as needed for indigestion or diarrhea or loose stools. **capsule**     calcium  carbonate (TUMS - DOSED IN MG ELEMENTAL CALCIUM ) 500 MG chewable tablet Chew 1 tablet by mouth as needed for indigestion or heartburn.     carvedilol  (COREG ) 6.25 MG tablet Take 1 tablet (6.25 mg total) by mouth 2 (two) times daily. 180 tablet 3   Cholecalciferol (VITAMIN D -3 PO) Take 1 tablet by mouth daily.     clopidogrel  (PLAVIX ) 75 MG tablet Take 1 tablet (75 mg total) by mouth daily. 90 tablet 1   colestipol  (COLESTID ) 1 g tablet Take 1 tablet (1 g total) by mouth 2 (two) times daily. 180 tablet 3   famotidine  (PEPCID ) 20 MG tablet TAKE 1 TABLET BY MOUTH TWICE A DAY 180 tablet 3   ferrous sulfate  325 (65 FE) MG tablet Take 325 mg by mouth daily with breakfast.     fluocinonide (LIDEX) 0.05 % external solution Apply 1 Application topically daily as needed (scalp).     furosemide  (LASIX ) 20 MG tablet TAKE  1 TABLET BY MOUTH EVERY DAY 90 tablet 3   lisinopril  (ZESTRIL ) 5 MG tablet Take 1 tablet (5 mg total) by mouth daily. 90 tablet 1   Multiple Vitamins-Minerals (CENTRUM SILVER PO) Take 1 tablet by mouth daily.     oxybutynin  (DITROPAN -XL) 10 MG 24 hr tablet TAKE 1 TABLET BY MOUTH EVERY DAY 90 tablet 3   pantoprazole  (PROTONIX ) 40 MG tablet TAKE 1 TABLET (40 MG TOTAL) BY MOUTH TWICE A DAY BEFORE MEALS 180 tablet 1   potassium chloride  (KLOR-CON ) 10 MEQ tablet TAKE 1 TABLET BY MOUTH EVERY DAY 90 tablet 3   pregabalin  (LYRICA ) 25 MG capsule Take 1 capsule (25 mg total) by mouth 2 (two) times daily. 60 capsule 0   rosuvastatin  (CRESTOR ) 20 MG  tablet Take 1 tablet (20 mg total) by mouth daily. 90 tablet 1   Venlafaxine  HCl 225 MG TB24 TAKE 1 TABLET (225 MG TOTAL) BY MOUTH DAILY AT 2 PM. 90 tablet 0   No facility-administered medications prior to visit.     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  ER/PR NEGATIVE   Cancer (HCC)    Melanoma very early stage   Cataract    Chronic cough    Complex sleep apnea syndrome    AVAPS   Complication of anesthesia    20 years ago pts b/p dropped really low, coded   Depression    Depression    Diabetes mellitus without complication (HCC)    diet controlled   Family history of adverse reaction to anesthesia    son PONV   GERD (gastroesophageal reflux disease)    Gout    Hypercholesteremia    Hyperlipidemia    Hypertension    Melanoma (HCC)    MVP (mitral valve prolapse)    followed by PCP   Nephritis    Nephritis    S/P appendectomy    Stroke [facility])    Thoracic aortic aneurysm    Torn meniscus    left     PAST SURGICAL HISTORY: Past Surgical History:  Procedure Laterality Date   ABDOMINAL HYSTERECTOMY     APPENDECTOMY      BREAST BIOPSY Right 04/25/2017   retroareolar 10:00   wing clip   path pending   BREAST BIOPSY Right 04/25/2017   10:00  5CMFN  venus clip    path pending   BREAST BIOPSY Right 05/03/2017   Affirm Bx- path pending   BREAST CYST ASPIRATION Bilateral    NEG   BREAST EXCISIONAL BIOPSY Left 20+ yrs ago   NEG   BREAST RECONSTRUCTION WITH PLACEMENT OF TISSUE EXPANDER AND FLEX HD (ACELLULAR HYDRATED DERMIS) Right 06/03/2017   Procedure: BREAST RECONSTRUCTION WITH PLACEMENT OF TISSUE EXPANDER AND FLEX HD (ACELLULAR HYDRATED DERMIS);  Surgeon: Lowery Estefana RAMAN, DO;  Location: [facility] OR[facility];  Service: Plastics;  Laterality: Right;   BREAST REDUCTION SURGERY Left 11/06/2017   Procedure: BREAST REDUCTION;  Surgeon: Lowery Estefana RAMAN, DO;  Location: [facility];  Service: Plastics;  Laterality: Left;   BROW LIFT Bilateral 02/01/2015   Procedure: BLEPHAROPLASTY bilateral upper eyelids.;  Surgeon: Greig CHRISTELLA Gay, MD;  Location: [facility];  Service: Ophthalmology;  Laterality: Bilateral;  DIABETIC - diet controlled   CATARACT EXTRACTION W/PHACO Left 01/14/2019   Procedure: CATARACT EXTRACTION PHACO AND INTRAOCULAR LENS PLACEMENT (IOC) LEFT TECNIS TORIC ADD 5.62 00:46.5 30.0%;  Surgeon: Mittie Gaskin, MD;  Location: [facility];  Service: Ophthalmology;  Laterality: Left;  diabetic - diet controlled   CATARACT EXTRACTION W/PHACO Right 02/11/2019   Procedure: CATARACT EXTRACTION PHACO AND INTRAOCULAR LENS PLACEMENT (IOC) RIGHT DIABETIC 4.82 00:46.9 10.3%;  Surgeon: Mittie Gaskin, MD;  Location: [facility];  Service: Ophthalmology;  Laterality: Right;  Diabetic - diet controlled   CHOLECYSTECTOMY     COLONOSCOPY     COLONOSCOPY WITH PROPOFOL  N/A 05/11/2015   Procedure: COLONOSCOPY WITH PROPOFOL ;  Surgeon: Lamar ONEIDA Holmes, MD;  Location: [facility] END[facility];  Service: Endoscopy;  Laterality: N/A;   COLONOSCOPY WITH PROPOFOL  N/A 09/07/2020  Procedure: COLONOSCOPY WITH PROPOFOL ;   Surgeon: Toledo, Ladell POUR, MD;  Location: [facility] ENDOSCOPY;  Service: Gastroenterology;  Laterality: N/A;   ESOPHAGOGASTRODUODENOSCOPY     ESOPHAGOGASTRODUODENOSCOPY (EGD) WITH PROPOFOL   05/11/2015   Procedure: ESOPHAGOGASTRODUODENOSCOPY (EGD) WITH PROPOFOL ;  Surgeon: Lamar ONEIDA Holmes, MD;  Location[facility] END[facility];  Service: Endoscopy;;   EYE SURGERY Bilateral    cataracts   FRACTURE SURGERY Right    arm and shoulder   IR ANGIO INTRA EXTRACRAN SEL COM CAROTID INNOMINATE BILAT MOD SED  02/27/2023   IR ANGIO INTRA EXTRACRAN SEL INTERNAL CAROTID UNI L MOD SED  04/08/2023   IR ANGIO VERTEBRAL SEL VERTEBRAL UNI R MOD SED  02/27/2023   IR CT HEAD LTD  04/08/2023   IR INTRA CRAN STENT  04/08/2023   IR RADIOLOGIST EVAL & MGMT  03/15/2023   IR RADIOLOGIST EVAL & MGMT  05/06/2023   IR US  GUIDE VASC ACCESS RIGHT  02/27/2023   JOINT REPLACEMENT     KNEE ARTHROSCOPY Left 03/14/2016   Procedure: ARTHROSCOPY KNEE, PARTIAL MEDIAL MENISECTOMY, CHONDROPLASTY;  Surgeon: Lynwood SHAUNNA Hue, MD;  Location: [facility];  Service: Orthopedics;  Laterality: Left;   MASTECTOMY Right 2019   MASTECTOMY W/ SENTINEL NODE BIOPSY Right 06/03/2017   Procedure: MASTECTOMY WITH SENTINEL LYMPH NODE BIOPSY;  Surgeon: Dessa Reyes ORN, MD;  Location: [facility];  Service: General;  Laterality: Right;   PARTIAL HIP ARTHROPLASTY Right    RADIOLOGY WITH ANESTHESIA N/A 04/08/2023   Procedure: RADIOLOGY WITH ANESTHESIA;  Surgeon: Dolphus Carrion, MD;  [facility]: [facility];  Service: Radiology;  Laterality: N/A;  Cerebral angioplasty with stenting   REDUCTION MAMMAPLASTY Left 2019   REMOVAL OF BILATERAL TISSUE EXPANDERS WITH PLACEMENT OF BILATERAL BREAST IMPLANTS Right 11/06/2017   Procedure: REMOVAL OF TISSUE EXPANDER WITH PLACEMENT OF BREAST IMPLANT;  Surgeon: Lowery Estefana RAMAN, DO;  Location: [facility];  Service: Plastics;  Laterality: Right;   TONSILLECTOMY       FAMILY HISTORY: Family History  Problem Relation Age of Onset    Cancer Mother        colon/rectal   Colon cancer Mother    Heart disease Father    Hypertension Father    Atrial fibrillation Father    Diabetes Father    Cancer Brother        lung   Healthy Son    Healthy Son    Breast cancer Neg Hx    Stroke Neg Hx      SOCIAL HISTORY: Social History   Socioeconomic History   Marital status: Married    Spouse name: [name]   Number of children: 2   Years of education: Not on file   Highest education level: Master's degree (e.g., MA, MS, MEng, MEd, MSW, MBA)  Occupational History   Occupation: retired  Tobacco Use   Smoking status: Former    Current packs/day: 0.00    Types: Cigarettes    Start date: 1963    Quit date: 1965    Years since quitting: 60.9   Smokeless tobacco: Never   Tobacco comments:    smoked in college--1 pack would last one week.  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    Retired    Teacher, Early Years/pre Strain: Low Risk  (08/14/2023)   Overall Financial Resource Strain (CARDIA)    Difficulty of Paying Living Expenses: Not hard at all  Food Insecurity: No Food Insecurity (08/14/2023)   Hunger Vital Sign    Worried About Running Out of Food in the Last Year: Never true    Ran Out of Food in the Last Year: Never true  Transportation Needs: No Transportation Needs (08/14/2023)   PRAPARE - Administrator, Civil Service (Medical): No    Lack of Transportation (Non-Medical): No  Physical Activity: Sufficiently Active (08/14/2023)   Exercise Vital Sign    Days of Exercise per Week: 4 days     Minutes of Exercise per Session: 60 min  Stress: No Stress Concern Present (08/14/2023)   [employer] of Occupational Health - Occupational Stress Questionnaire    Feeling of Stress: Not at all  Social Connections: Socially Integrated (08/14/2023)   Social Connection and Isolation Panel    Frequency of Communication with Friends and Family: More than three times a week    Frequency of Social Gatherings with Friends and Family: More than three times a week    Attends Religious Services: More than 4 times per year    Active Member of [employer] or Organizations: Yes    Attends Banker Meetings: More than 4 times per year    Marital Status: Married  Catering Manager Violence: Not At Risk (08/14/2023)   Humiliation, Afraid, Rape, and Kick questionnaire    Fear of Current or Ex-Partner: No    Emotionally Abused: No    Physically Abused: No    Sexually Abused: No     PHYSICAL EXAM  Vitals:   12/16/23 1330 12/16/23 1337  BP: (!) 148/92 (!) 141/86  Pulse: 72   SpO2: 93%   Height: 5' 6 (1.676 m)    Body mass index is 29.94 kg/m.  Generalized: Well developed, in no acute distress  Cardiology: normal rate and rhythm, no murmur auscultated  Respiratory: clear to auscultation bilaterally    Neurological examination  Mentation: Alert oriented to time, place, history taking. Follows all commands speech and language fluent Cranial nerve II-XII: Pupils were equal round reactive to light. Extraocular movements were full, visual field were full on confrontational test. Facial sensation and strength were normal. Uvula tongue midline. Head turning and shoulder shrug  were normal and symmetric. Motor: The motor testing reveals 5 over 5 strength of all 4 extremities. Good symmetric motor tone is noted throughout.  Sensory: Sensory testing is intact to soft touch on all 4 extremities. No evidence of extinction is noted.  Coordination: Cerebellar testing reveals good finger-nose-finger  and heel-to-shin bilaterally.  Gait and station: Gait is normal. Tandem gait is normal. Romberg is negative. No drift is seen.  Reflexes: Deep tendon reflexes are symmetric and normal bilaterally.    DIAGNOSTIC DATA (LABS, IMAGING, TESTING) - I reviewed patient records, labs, notes, testing and imaging myself where available.  Lab Results  Component Value Date   WBC 6.2 11/21/2023   HGB 11.3 11/21/2023   HCT 36.0 11/21/2023   MCV 91 11/21/2023   PLT 315 11/21/2023      Component Value Date/Time   NA 142 11/21/2023 1155   K  4.3 11/21/2023 1155   CL 105 11/21/2023 1155   CO2 21 11/21/2023 1155   GLUCOSE 78 11/21/2023 1155   GLUCOSE 121 (H) 04/09/2023 0533   BUN 17 11/21/2023 1155   CREATININE 1.09 (H) 11/21/2023 1155   CALCIUM  10.0 11/21/2023 1155   PROT 6.7 11/21/2023 1155   ALBUMIN 4.6 11/21/2023 1155   AST 16 11/21/2023 1155   ALT 12 11/21/2023 1155   ALKPHOS 109 11/21/2023 1155   BILITOT 0.4 11/21/2023 1155   GFRNONAA >60 04/09/2023 0533   GFRAA 55 (L) 08/05/2019 0843   Lab Results  Component Value Date   CHOL 152 11/21/2023   HDL 32 (L) 11/21/2023   LDLCALC 67 11/21/2023   LDLDIRECT 81 02/26/2023   TRIG 333 (H) 11/21/2023   CHOLHDL 4.8 (H) 11/21/2023   Lab Results  Component Value Date   HGBA1C 6.0 (H) 11/21/2023   No results found for: VITAMINB12 Lab Results  Component Value Date   TSH 2.260 11/21/2023        No data to display               No data to display           ASSESSMENT AND PLAN  83 y.o. year old female  has a past medical history of Anemia in pregnancy, Arthritis, Barrett esophagus, Bell palsy (04/24/2004), Breast cancer (HCC) (04-2017), Breast neoplasm, Tis (DCIS), right (06/03/2017), Cancer (HCC), Cataract, Chronic cough, Complex sleep apnea syndrome, Complication of anesthesia, Depression, Depression, Diabetes mellitus without complication (HCC), Family history of adverse reaction to anesthesia, GERD (gastroesophageal reflux  disease), Gout, Hypercholesteremia, Hyperlipidemia, Hypertension, Melanoma (HCC), MVP (mitral valve prolapse), Nephritis, Nephritis, S/P appendectomy, Stroke [facility]), Thoracic aortic aneurysm, and Torn meniscus. here with    Brainstem stroke (HCC) - Plan: US  Carotid Bilateral  Carotid stenosis, asymptomatic, left - Plan: US  Carotid Bilateral  S/P angioplasty with stent - Plan: US  Carotid Bilateral  Roderick Kimesha Claxton reports doing well. She will continue close follow up with PCP. Continue Pt/OT as directed. We will repeat carotid US  for surveillance. Continue Plavix  and rosuvastatin  for stroke prevention. Fall precautions advised. Healthy lifestyle habits encouraged. She will follow up with PCP as directed. She will return to see me as needed. She verbalizes understanding and agreement with this plan.   Orders Placed This Encounter  Procedures   US  Carotid Bilateral    Standing Status:   Future    Expiration Date:   12/15/2024    Reason for Exam (SYMPTOM  OR DIAGNOSIS REQUIRED):   hx CVA, carotid stenosis    Preferred imaging location?:   [facility]     No orders of the defined types were placed in this encounter.    Greig Forbes, MSN, FNP-C 12/16/2023, 2:17 PM  [facility] [address], [address] [place], [place] [address] [phone]

## 2023-12-11 ENCOUNTER — Other Ambulatory Visit: Payer: Self-pay | Admitting: Family Medicine

## 2023-12-11 DIAGNOSIS — E1169 Type 2 diabetes mellitus with other specified complication: Secondary | ICD-10-CM

## 2023-12-16 ENCOUNTER — Encounter: Payer: Self-pay | Admitting: Family Medicine

## 2023-12-16 ENCOUNTER — Ambulatory Visit: Admitting: Family Medicine

## 2023-12-16 VITALS — BP 141/86 | HR 72 | Ht 66.0 in

## 2023-12-16 DIAGNOSIS — I6522 Occlusion and stenosis of left carotid artery: Secondary | ICD-10-CM | POA: Diagnosis not present

## 2023-12-16 DIAGNOSIS — Z95820 Peripheral vascular angioplasty status with implants and grafts: Secondary | ICD-10-CM

## 2023-12-16 DIAGNOSIS — I639 Cerebral infarction, unspecified: Secondary | ICD-10-CM

## 2023-12-16 NOTE — Patient Instructions (Signed)
 Below is our plan:  We will order carotid ultrasound for monitoring. Continue Plavix  and rosuvastatin  per PCP.   Goals:  1) Maintain strict control of hypertension with blood pressure goal below 130/90 2) Maintain good control of diabetes with hemoglobin A1c goal below 7%  3) Maintain good control of lipids with LDL cholesterol goal below 70 mg/dL.  4) Eat a healthy diet with plenty of whole grains, cereals, fruits and vegetables, exercise regularly and maintain ideal body weight   Resources: [email or web address].[email or web address]  Please make sure you are staying well hydrated. I recommend 50-60 ounces daily. Well balanced diet and regular exercise encouraged. Consistent sleep schedule with 6-8 hours recommended.   Please continue follow up with care team as directed.   Follow up with me as needed   You may receive a survey regarding today's visit. I encourage you to leave honest feed back as I do use this information to improve patient care. Thank you for seeing me today!

## 2023-12-19 DIAGNOSIS — G4733 Obstructive sleep apnea (adult) (pediatric): Secondary | ICD-10-CM | POA: Diagnosis not present

## 2023-12-23 ENCOUNTER — Ambulatory Visit: Admission: RE | Admit: 2023-12-23 | Discharge: 2023-12-23 | Attending: Family Medicine | Admitting: Family Medicine

## 2023-12-23 DIAGNOSIS — I639 Cerebral infarction, unspecified: Secondary | ICD-10-CM

## 2023-12-23 DIAGNOSIS — Z95820 Peripheral vascular angioplasty status with implants and grafts: Secondary | ICD-10-CM

## 2023-12-23 DIAGNOSIS — I6522 Occlusion and stenosis of left carotid artery: Secondary | ICD-10-CM | POA: Diagnosis not present

## 2023-12-23 DIAGNOSIS — I6521 Occlusion and stenosis of right carotid artery: Secondary | ICD-10-CM | POA: Diagnosis not present

## 2023-12-25 ENCOUNTER — Other Ambulatory Visit: Payer: Self-pay | Admitting: Thoracic Surgery (Cardiothoracic Vascular Surgery)

## 2023-12-26 ENCOUNTER — Ambulatory Visit: Payer: Self-pay | Admitting: Family Medicine

## 2024-01-03 ENCOUNTER — Other Ambulatory Visit: Payer: Self-pay | Admitting: Student

## 2024-01-11 ENCOUNTER — Other Ambulatory Visit: Payer: Self-pay | Admitting: Family Medicine

## 2024-01-11 DIAGNOSIS — E1169 Type 2 diabetes mellitus with other specified complication: Secondary | ICD-10-CM

## 2024-01-21 DIAGNOSIS — E1169 Type 2 diabetes mellitus with other specified complication: Secondary | ICD-10-CM

## 2024-01-21 MED ORDER — ROSUVASTATIN CALCIUM 20 MG PO TABS: 20.0000 mg | ORAL_TABLET | Freq: Every day | ORAL | 1 refills | Status: AC

## 2024-01-26 ENCOUNTER — Other Ambulatory Visit: Payer: Self-pay | Admitting: Family Medicine

## 2024-02-03 ENCOUNTER — Other Ambulatory Visit: Payer: Self-pay | Admitting: Family Medicine

## 2024-02-03 DIAGNOSIS — F3341 Major depressive disorder, recurrent, in partial remission: Secondary | ICD-10-CM

## 2024-02-04 ENCOUNTER — Encounter: Payer: Self-pay | Admitting: Pulmonary Disease

## 2024-02-04 ENCOUNTER — Ambulatory Visit: Admitting: Pulmonary Disease

## 2024-02-04 VITALS — BP 124/70 | HR 69 | Temp 98.0°F | Ht 65.5 in | Wt 191.2 lb

## 2024-02-04 DIAGNOSIS — R0609 Other forms of dyspnea: Secondary | ICD-10-CM | POA: Diagnosis not present

## 2024-02-04 DIAGNOSIS — G4733 Obstructive sleep apnea (adult) (pediatric): Secondary | ICD-10-CM

## 2024-02-04 DIAGNOSIS — Z87891 Personal history of nicotine dependence: Secondary | ICD-10-CM

## 2024-02-04 DIAGNOSIS — Z8673 Personal history of transient ischemic attack (TIA), and cerebral infarction without residual deficits: Secondary | ICD-10-CM

## 2024-02-04 DIAGNOSIS — G9089 Other disorders of autonomic nervous system: Secondary | ICD-10-CM

## 2024-02-04 NOTE — Progress Notes (Unsigned)
 "  Subjective:    Patient ID: Natasha Vasquez, female    DOB: [birth year], 84 y.o.   MRN: [id number]  Patient Care Team: Myrla Jon HERO, MD as PCP - General (Family Medicine) Perla Evalene PARAS, MD as PCP - Cardiology (Cardiology) Waddell Thom SAUNDERS, PA (Inactive) as Physician Assistant (Physician Assistant) Mardee, Lynwood SQUIBB, MD as Consulting Physician (Orthopedic Surgery) Dessa Reyes ORN, MD (General Surgery) Dasher, Alm LABOR, MD (Dermatology) Rennie Cindy SAUNDERS, MD as Consulting Physician (Internal Medicine) Mittie Gaskin, MD as Referring Physician (Ophthalmology) Tamea Dedra CROME, MD as Consulting Physician (Pulmonary Disease) Kerrin Elspeth BROCKS, MD as Consulting Physician (Cardiothoracic Surgery)  Chief Complaint  Patient presents with   Asthma    SOB with walking.  Cannot walk any distance without stopping to rest    BACKGROUND/INTERVAL:Natasha Vasquez is an 84 year old very remote former smoker with minimal tobacco history, who presents for follow-up on the issue of dyspnea on exertion that she expresses mostly as fatigue.  She has been diagnosed with complex sleep apnea and is currently on auto CPAP.  She was last seen here on 20 March 2023, this is a follow-up visit.  Recall that she was admitted to [facility] on 25 February 2023 through 01 March 2023 with a CVA.  She was noted to have left ICA stenosis and MRI of the brain that showed infarction within the left midbrain and left cerebral peduncle.  The patient was not a candidate for TNK due to presenting outside of the window.  She has recovered well from her stroke she continues to receive therapy.  She was last seen on 20 June 2023.  HPI Discussed the use of AI scribe software for clinical note transcription with the patient, who gave verbal consent to proceed.  History of Present Illness   Natasha Vasquez is an 84 year old female who presents with shortness of breath and spells resembling panic  attacks.  She presents with her husband, Natasha Vasquez.  She experiences episodes resembling hot flashes, described as 'spells' lasting about 30 seconds. These episodes start suddenly, spread from her upper body to her knees, and are accompanied by sensations similar to panic attacks. They occur more frequently when she is tired or has overexerted herself.  Her occupational therapist suggest that this may be related to vagus nerve issues.  She followed by neurology after her stroke.   She has a history of sleep apnea and uses a CPAP machine with a nasal guard, which has improved her sleep quality. She generally sleeps through the night, occasionally waking to use the bathroom, and resumes sleep easily. She uses a CPAP with a flow of one liter of oxygen .  Her husband, Natasha Vasquez, has stopped driving, which has increased her responsibilities, including driving and managing household tasks. Her sons are supportive, assisting with tasks such as income tax preparation and bill payments.     DATA: 04/03/2021 echocardiogram: LVEF 60 to 65%, no regional wall motion abnormalities.  Moderate LVH. Grade 1 DD.  RV function normal. 04/20/2021 PFTs: FEV1 1.95 L or 99% predicted, FVC 2.63 L or 102% predicted, FEV1/FVC 74%, no bronchodilator response.  Lung volumes normal with minimal air trapping.  Diffusion capacity mildly reduced.  Overall normal study. 05/04/2021 CT angio chest: Left upper lobe and lingular pneumonia, no pulmonary embolism 06/01/2021 CT coronary: Coronary score 0, no overt coronary findings on lung windows, small hiatal hernia. 08/18/2021 split-night sleep study: Consistent with severe obstructive sleep apnea and treatment emergent central sleep apnea,  titration was not successful.  Eventually patient titrated to auto CPAP.  AHI was 90.5/h, O2 saturations as low as 78%. 06/01/2021 CT coronary: Normal coronary calcium  score of 0, mild to moderately dilated ascending aorta (known issue), no abnormalities on the  pulmonary parenchyma visualized.  Small sliding hiatal hernia. 03/09/2022 CT chest contrast: Enlarging ascending thoracic aortic aneurysm measuring up to 4.8 cm, hepatic steatosis, small hiatal hernia, aortic atherosclerosis. 08/13/2022 CT chest with contrast: Unchanged size of thoracic aortic aneurysm estimated to be 4.7 cm concurrent study.  Central airways clear.  No pleural effusion.  No airspace disease.  Hiatal hernia present.  No other acute finding in the upper abdomen. 08/23/2022 PFTs: FEV1 1.64 L or 83% predicted, FVC 2.48 L or 93% predicted, FEV1/FVC 66%, diffusion capacity normal, lung volumes normal.  Normal airway obstruction presents suggesting small airways disease.  There is bronchodilator response.  Review of Systems A 10 point review of systems was performed and it is as noted above otherwise negative.   Patient Active Problem List   Diagnosis Date Noted   OSA on CPAP 05/14/2023   Arterial stenosis 04/08/2023   Stenosis of left carotid artery 04/08/2023   History of cerebrovascular accident (CVA) with residual deficit 03/15/2023   ICAO (internal carotid artery occlusion), left 02/26/2023   Brainstem infarct, acute (HCC) 02/26/2023   Right sided weakness 02/25/2023   History of CVA (cerebrovascular accident) without residual deficits 02/25/2023   Moderate episode of recurrent major depressive disorder (HCC) 02/11/2023   Sciatic pain, left 09/27/2021   SOB (shortness of breath) 05/16/2021   Aneurysm of ascending aorta without rupture 05/10/2021   HLD (hyperlipidemia) 05/04/2021   Elevated troponin 05/04/2021   Chronic diastolic CHF (congestive heart failure) (HCC) 05/04/2021   Recurrent major depressive disorder, in partial remission 03/24/2021   Gout 03/24/2021   PAD (peripheral artery disease) 06/24/2020   Iron  deficiency 10/07/2018   Primary osteoarthritis of left knee 09/17/2018   Emphysema lung (HCC) 03/14/2018   Diabetic peripheral neuropathy associated with type  2 diabetes mellitus (HCC) 03/14/2018   Metatarsalgia of both feet 03/14/2018   Abnormality of lung 03/14/2018   Acquired absence of right breast 06/11/2017   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   Dysphagia 03/11/2017   Obesity 03/11/2017   Subclinical hypothyroidism 06/08/2015   Osteopenia 06/08/2015   Billowing mitral valve 06/08/2015   Melanoma in situ (HCC) 06/08/2015   Cramps of lower extremity 06/08/2015   Type 2 diabetes mellitus (HCC) 05/18/2015   Chronic diarrhea 05/18/2015   Anemia 05/18/2015   Clinical depression 01/26/2015   Hyperlipidemia associated with type 2 diabetes mellitus (HCC) 10/28/2014   Hypertension associated with diabetes (HCC) 09/27/2014   GERD (gastroesophageal reflux disease) 08/06/2014   Overactive bladder 07/16/2014    Social History   Tobacco Use   Smoking status: Former    Current packs/day: 0.00    Types: Cigarettes    Start date: 1963    Quit date: 1965    Years since quitting: 61.0   Smokeless tobacco: Never   Tobacco comments:    smoked in college--1 pack would last one week.  Substance Use Topics   Alcohol use: Yes    Alcohol/week: 7.0 - 14.0 standard drinks of alcohol    Types: 7 - 14 Glasses of wine per week    Comment: 1-2 glasses of wine per night    Allergies[1]  Active Medications[2]  Immunization History  Administered Date(s) Administered   Fluad Quad(high Dose 65+) 09/09/2018, 10/12/2019, 10/14/2020, 09/25/2021   INFLUENZA, HIGH DOSE SEASONAL PF 11/18/2013, 01/24/2016, 10/08/2016, 09/12/2017, 10/12/2022, 10/10/2023   Influenza Split 10/24/2011   Influenza-Unspecified 10/24/2011, 10/14/2012, 11/18/2013   Moderna Sars-Covid-2 Vaccination 01/30/2019, 03/02/2019   Pfizer(Comirnaty)Fall Seasonal Vaccine 12 years and older 10/12/2022   Pneumococcal Conjugate-13 08/21/2013   Pneumococcal Polysaccharide-23 01/24/2016    Respiratory Syncytial Virus Vaccine,Recomb Aduvanted(Arexvy) 11/11/2021   Tdap 10/24/2011   Zoster Recombinant(Shingrix) 04/01/2016, 08/14/2016        Objective:     Vitals:   02/04/24 1549  BP: 124/70  Pulse: 69  Temp: 98 F (36.7 C)  Height: 5' 5.5 (1.664 m)  Weight: 191 lb 3.2 oz (86.7 kg)  SpO2: 93% Comment: room air  TempSrc: Oral  BMI (Calculated): 31.32     SpO2: 93 % (room air)  GENERAL: Overweight woman, spry, age-appropriate, ambulation supported by cane, mild conversational dyspnea. HEAD: Normocephalic, atraumatic.  EYES: Pupils equal, round, reactive to light.  No scleral icterus.  MOUTH: Oral mucosa moist, no thrush. NECK: Supple. No thyromegaly. Trachea midline. No JVD.  No adenopathy.  Faint bilateral carotid bruits. PULMONARY: Good air entry bilaterally.  No adventitious sounds. CARDIOVASCULAR: S1 and S2. Regular rate and rhythm.  Has a grade 1/6 systolic ejection murmur in the mitral position. ABDOMEN: Protuberant, otherwise benign MUSCULOSKELETAL: No joint deformity, no clubbing, no edema.  NEUROLOGIC: No overt focal deficit, gait steady and supported by cane, speech is fluent.  Minimal weakness of the right hand. SKIN: Intact,warm,dry. PSYCH: Mood and behavior normal.         Assessment & Plan:     ICD-10-CM   1. Dyspnea on exertion  R06.09     2. OSA (obstructive sleep apnea) - COMPLEX  G47.33     3. Brainstem infarct, recent, without late effect  Z86.73     4. Dysautonomia-like disorder  G90.89      Discussion:    Obstructive sleep apnea Well-managed with CPAP therapy. She reports sleeping well with CPAP and using supplemental oxygen  at 1 liter per minute. Occasional awakenings are managed by resuming CPAP use. No significant issues with CPAP use, although she mentions some nasal discomfort. - Continue CPAP therapy with supplemental oxygen  at 1 liter per minute.  Possible disorder of the autonomic nervous system  (dysautonomia) Intermittent episodes of autonomic dysregulation, possibly related to vagus nerve dysfunction, presenting as brief spells of hot flashes or panic-like symptoms lasting about 30 seconds. Symptoms occur when tired or overexerted. Neurologist consulted previously. Non-invasive management options discussed, including humming to stimulate the vagus nerve. Vagus nerve stimulators are available but are costly and not covered by insurance. - Try humming to stimulate the vagus nerve during episodes. - Consider pinching the earlobe in a circular motion as an alternative method to stimulate the vagus nerve.     Follow-up in 4 months time.  Advised if symptoms do not improve or worsen, to please contact office for sooner follow up or seek emergency care.    I spent 31 minutes of dedicated to the care of this patient on the date of this encounter to include pre-visit review of records, face-to-face time with the patient discussing conditions above, post visit ordering of testing, clinical documentation with the electronic health record, making appropriate referrals as documented, and communicating necessary findings to members of the patients care team.     C. Leita Sanders, MD Advanced Bronchoscopy PCCM [facility]    *This  note was generated using voice recognition software/Dragon and/or AI transcription program.  Despite best efforts to proofread, errors can occur which can change the meaning. Any transcriptional errors that result from this process are unintentional and may not be fully corrected at the time of dictation.     [1]  Allergies Allergen Reactions   Codeine Other (See Comments)    Chest pain   Ciprofloxacin      Can not take    Quinolones     PATIENT UNAWARE OF allergy to this class of antibiotics, only aware of allergy to [facility] Patient was warned about not using Cipro  and similar antibiotics. Recent studies have raised concern that fluoroquinolone  antibiotics could be associated with an increased risk of aortic aneurysm Fluoroquinolones have non-antimicrobial properties that might jeopardise the integrity of the extracellular matrix of the vascular wall In a  propensity score matched cohort study in [place], there was a 66% increased rate   Aleve [Naproxen Sodium] Hives, Rash and Other (See Comments)    headaches   Lorazepam Other (See Comments)    Feels loopy   Oxycodone  Other (See Comments)    MENTAL CHANGES   Paregoric Other (See Comments)    Chest pain---tolerated MORPHINE    Penicillins Hives    Can take Augmentin  [2]  Current Meds  Medication Sig   acetaminophen  (TYLENOL ) 500 MG tablet Take 500 mg by mouth every 6 (six) hours as needed for moderate pain (pain score 4-6).   albuterol  (VENTOLIN  HFA) 108 (90 Base) MCG/ACT inhaler TAKE 2 PUFFS BY MOUTH EVERY 6 HOURS AS NEEDED FOR WHEEZE OR SHORTNESS OF BREATH   allopurinol  (ZYLOPRIM ) 100 MG tablet Take 2 tablets (200 mg total) by mouth daily.   ARIPiprazole  (ABILIFY ) 2 MG tablet TAKE 1 TABLET BY MOUTH EVERY DAY   bismuth  subsalicylate (PEPTO BISMOL) 262 MG chewable tablet Chew 262 mg by mouth as needed for indigestion or diarrhea or loose stools. **capsule**   calcium  carbonate (TUMS - DOSED IN MG ELEMENTAL CALCIUM ) 500 MG chewable tablet Chew 1 tablet by mouth as needed for indigestion or heartburn.   carvedilol  (COREG ) 6.25 MG tablet TAKE 1 TABLET BY MOUTH TWICE A DAY   Cholecalciferol (VITAMIN D -3 PO) Take 1 tablet by mouth daily.   clopidogrel  (PLAVIX ) 75 MG tablet Take 1 tablet (75 mg total) by mouth daily.   colestipol  (COLESTID ) 1 g tablet Take 1 tablet (1 g total) by mouth 2 (two) times daily.   famotidine  (PEPCID ) 20 MG tablet TAKE 1 TABLET BY MOUTH TWICE A DAY   ferrous sulfate  325 (65 FE) MG tablet Take 325 mg by mouth daily with breakfast.   fluocinonide (LIDEX) 0.05 % external solution Apply 1 Application topically daily as needed (scalp).   furosemide  (LASIX ) 20  MG tablet TAKE 1 TABLET BY MOUTH EVERY DAY   lisinopril  (ZESTRIL ) 5 MG tablet Take 1 tablet (5 mg total) by mouth daily.   Multiple Vitamins-Minerals (CENTRUM SILVER PO) Take 1 tablet by mouth daily.   oxybutynin  (DITROPAN -XL) 10 MG 24 hr tablet TAKE 1 TABLET BY MOUTH EVERY DAY   pantoprazole  (PROTONIX ) 40 MG tablet TAKE 1 TABLET (40 MG TOTAL) BY MOUTH TWICE A DAY BEFORE MEALS   potassium chloride  (KLOR-CON ) 10 MEQ tablet TAKE 1 TABLET BY MOUTH EVERY DAY   pregabalin  (LYRICA ) 25 MG capsule Take 1 capsule (25 mg total) by mouth 2 (two) times daily.   rosuvastatin  (CRESTOR ) 20 MG tablet Take 1 tablet (20 mg total) by mouth daily.  Venlafaxine  HCl 225 MG TB24 TAKE 1 TABLET (225 MG TOTAL) BY MOUTH DAILY AT 2 PM.   "

## 2024-02-04 NOTE — Patient Instructions (Signed)
 VISIT SUMMARY:  During your visit, we discussed your shortness of breath and spells resembling panic attacks. We reviewed your sleep apnea management and explored options for managing your autonomic nervous system disorder.  YOUR PLAN:  -OBSTRUCTIVE SLEEP APNEA: Obstructive sleep apnea is a condition where your breathing stops and starts during sleep due to blocked airways. Your condition is well-managed with CPAP therapy and supplemental oxygen  at 1 liter per minute. Continue using your CPAP machine as instructed, and manage any nasal discomfort as needed.  -DISORDER OF THE AUTONOMIC NERVOUS SYSTEM (DYSAUTONOMIA): Dysautonomia is a condition where the autonomic nervous system, which controls involuntary bodily functions, does not work properly. This can cause episodes of hot flashes or panic-like symptoms. To manage these episodes, try humming to stimulate the vagus nerve or pinching your earlobe in a circular motion.  INSTRUCTIONS:  Continue using your CPAP machine with supplemental oxygen  at 1 liter per minute. For episodes of autonomic dysregulation, try humming or pinching your earlobe in a circular motion to stimulate the vagus nerve.

## 2024-02-05 ENCOUNTER — Encounter: Payer: Self-pay | Admitting: Pulmonary Disease

## 2024-02-13 ENCOUNTER — Other Ambulatory Visit: Payer: Self-pay | Admitting: Family Medicine

## 2024-02-13 DIAGNOSIS — F4322 Adjustment disorder with anxiety: Secondary | ICD-10-CM

## 2024-02-21 ENCOUNTER — Telehealth: Payer: Self-pay

## 2024-02-21 ENCOUNTER — Other Ambulatory Visit: Payer: Self-pay

## 2024-02-21 DIAGNOSIS — M25551 Pain in right hip: Secondary | ICD-10-CM

## 2024-02-21 NOTE — Telephone Encounter (Signed)
 Converted to med refill. Sent to provider for review.

## 2024-02-21 NOTE — Telephone Encounter (Signed)
 LOV 11/21/23 NOV 06/01/24 LRF 12/09/23 qty:60 r:0

## 2024-02-21 NOTE — Telephone Encounter (Signed)
 Copied from CRM [id number]. Topic: Clinical - Medication Refill >> Feb 21, 2024  1:58 PM Everette C wrote: Medication: pregabalin  (LYRICA ) 25 MG capsule [[id number]]  Has the patient contacted their pharmacy? Yes (Agent: If no, request that the patient contact the pharmacy for the refill. If patient does not wish to contact the pharmacy document the reason why and proceed with request.) (Agent: If yes, when and what did the pharmacy advise?)  This is the patient's preferred pharmacy:  [id number] [address] [facility] - [address] [place] [address] Phone: [phone] Fax: [phone]  Is this the correct pharmacy for this prescription? Yes If no, delete pharmacy and type the correct one.   Has the prescription been filled recently? Yes  Is the patient out of the medication? Yes  Has the patient been seen for an appointment in the last year OR does the patient have an upcoming appointment? Yes  Can we respond through MyChart? No  Agent: Please be advised that Rx refills may take up to 3 business days. We ask that you follow-up with your pharmacy.

## 2024-06-01 ENCOUNTER — Encounter: Admitting: Family Medicine

## 2024-06-04 ENCOUNTER — Ambulatory Visit: Admitting: Pulmonary Disease

## 2024-08-19 ENCOUNTER — Ambulatory Visit
